# Patient Record
Sex: Male | Born: 1950
Health system: Southern US, Community
[De-identification: ages and names within clinical notes are randomized; demographics above are authoritative.]

## PROBLEM LIST (undated history)

## (undated) DIAGNOSIS — E785 Hyperlipidemia, unspecified: Secondary | ICD-10-CM

## (undated) DIAGNOSIS — M1990 Unspecified osteoarthritis, unspecified site: Secondary | ICD-10-CM

## (undated) DIAGNOSIS — E119 Type 2 diabetes mellitus without complications: Secondary | ICD-10-CM

## (undated) DIAGNOSIS — I10 Essential (primary) hypertension: Secondary | ICD-10-CM

## (undated) DIAGNOSIS — Z91030 Bee allergy status: Secondary | ICD-10-CM

## (undated) DIAGNOSIS — I6529 Occlusion and stenosis of unspecified carotid artery: Secondary | ICD-10-CM

## (undated) DIAGNOSIS — I771 Stricture of artery: Secondary | ICD-10-CM

## (undated) DIAGNOSIS — G4733 Obstructive sleep apnea (adult) (pediatric): Secondary | ICD-10-CM

## (undated) DIAGNOSIS — I2510 Atherosclerotic heart disease of native coronary artery without angina pectoris: Secondary | ICD-10-CM

## (undated) DIAGNOSIS — E079 Disorder of thyroid, unspecified: Secondary | ICD-10-CM

## (undated) DIAGNOSIS — Z8739 Personal history of other diseases of the musculoskeletal system and connective tissue: Secondary | ICD-10-CM

## (undated) DIAGNOSIS — E039 Hypothyroidism, unspecified: Secondary | ICD-10-CM

## (undated) DIAGNOSIS — I4819 Other persistent atrial fibrillation: Secondary | ICD-10-CM

## (undated) DIAGNOSIS — Z86010 Personal history of colonic polyps: Secondary | ICD-10-CM

## (undated) DIAGNOSIS — K269 Duodenal ulcer, unspecified as acute or chronic, without hemorrhage or perforation: Secondary | ICD-10-CM

## (undated) DIAGNOSIS — E559 Vitamin D deficiency, unspecified: Secondary | ICD-10-CM

## (undated) HISTORY — DX: Obstructive sleep apnea (adult) (pediatric): G47.33

## (undated) HISTORY — DX: Stricture of artery: I77.1

## (undated) HISTORY — DX: Essential (primary) hypertension: I10

## (undated) HISTORY — DX: Hyperlipidemia, unspecified: E78.5

## (undated) HISTORY — DX: Other persistent atrial fibrillation: I48.19

## (undated) HISTORY — DX: Occlusion and stenosis of unspecified carotid artery: I65.29

## (undated) HISTORY — PX: CORONARY ANGIOPLASTY: SHX604

## (undated) HISTORY — PX: UVULECTOMY: SHX2631

## (undated) HISTORY — DX: Vitamin D deficiency, unspecified: E55.9

## (undated) HISTORY — DX: Disorder of thyroid, unspecified: E07.9

## (undated) HISTORY — DX: Unspecified osteoarthritis, unspecified site: M19.90

## (undated) HISTORY — DX: Personal history of colonic polyps: Z86.010

## (undated) HISTORY — DX: Duodenal ulcer, unspecified as acute or chronic, without hemorrhage or perforation: K26.9

## (undated) HISTORY — DX: Atherosclerotic heart disease of native coronary artery without angina pectoris: I25.10

---

## 1999-03-03 ENCOUNTER — Encounter: Payer: Self-pay | Admitting: Internal Medicine

## 1999-03-03 ENCOUNTER — Inpatient Hospital Stay (HOSPITAL_COMMUNITY): Admission: EM | Admit: 1999-03-03 | Discharge: 1999-03-05 | Payer: Self-pay | Admitting: Emergency Medicine

## 1999-04-22 ENCOUNTER — Ambulatory Visit (HOSPITAL_COMMUNITY): Admission: RE | Admit: 1999-04-22 | Discharge: 1999-04-22 | Payer: Self-pay | Admitting: *Deleted

## 1999-04-23 ENCOUNTER — Encounter: Payer: Self-pay | Admitting: *Deleted

## 1999-05-26 HISTORY — PX: TOTAL HIP ARTHROPLASTY: SHX124

## 1999-10-03 ENCOUNTER — Encounter: Payer: Self-pay | Admitting: Orthopedic Surgery

## 1999-10-09 ENCOUNTER — Encounter: Payer: Self-pay | Admitting: Orthopedic Surgery

## 1999-10-09 ENCOUNTER — Inpatient Hospital Stay (HOSPITAL_COMMUNITY): Admission: RE | Admit: 1999-10-09 | Discharge: 1999-10-14 | Payer: Self-pay | Admitting: Orthopedic Surgery

## 1999-11-14 ENCOUNTER — Encounter: Payer: Self-pay | Admitting: Orthopedic Surgery

## 1999-11-14 ENCOUNTER — Inpatient Hospital Stay (HOSPITAL_COMMUNITY): Admission: RE | Admit: 1999-11-14 | Discharge: 1999-11-20 | Payer: Self-pay | Admitting: Orthopedic Surgery

## 1999-11-17 ENCOUNTER — Encounter: Payer: Self-pay | Admitting: Orthopedic Surgery

## 2007-05-27 LAB — HM COLONOSCOPY

## 2008-03-26 ENCOUNTER — Ambulatory Visit (HOSPITAL_COMMUNITY): Admission: RE | Admit: 2008-03-26 | Discharge: 2008-03-26 | Payer: Self-pay | Admitting: Internal Medicine

## 2008-03-27 ENCOUNTER — Ambulatory Visit: Payer: Self-pay | Admitting: Internal Medicine

## 2008-04-10 ENCOUNTER — Ambulatory Visit: Payer: Self-pay | Admitting: Internal Medicine

## 2008-04-10 ENCOUNTER — Encounter: Payer: Self-pay | Admitting: Internal Medicine

## 2008-04-10 DIAGNOSIS — Z860101 Personal history of adenomatous and serrated colon polyps: Secondary | ICD-10-CM | POA: Insufficient documentation

## 2008-04-10 DIAGNOSIS — Z86010 Personal history of colonic polyps: Secondary | ICD-10-CM

## 2008-04-10 HISTORY — DX: Personal history of adenomatous and serrated colon polyps: Z86.0101

## 2008-04-10 HISTORY — DX: Personal history of colonic polyps: Z86.010

## 2008-04-15 ENCOUNTER — Encounter: Payer: Self-pay | Admitting: Internal Medicine

## 2010-10-10 NOTE — Op Note (Signed)
. [facility]  Patient:    Christopher Wade, Christopher Wade                        MRN: [id number] Proc. Date: 10/09/99 Adm. Date:  [id number] Attending:  Colbert Ewing Dictator:   Loreta Ave, M.D.                           Operative Report  PREOPERATIVE DIAGNOSIS:  End-stage degenerative joint disease, left hip.  POSTOPERATIVE DIAGNOSIS:  End-stage degenerative joint disease, left hip.  PROCEDURE:  Left total hip replacement utilizing Osteonics prosthesis.  Press fit 58 mm acetabular component, screw fixation x 2, and 10 degree 28 mm crossfire polyethylene insert.  Femoral replacement with a secure fit plus size #10 with a 14 mm distal stem, 127 degree neck angle, and -3 head.  All non-cemented.  Appropriate soft tissue release and periarticular spur excision.  SURGEON:  Dr. Mckinley Jewel.  ASSISTANT:  Oris Drone. Petrarca, P.A.-C.  ANESTHESIA:  General.  ESTIMATED BLOOD LOSS:  300 cc.  SPECIMENS:  Excised bone and soft tissue.  BLOOD GIVEN:  None.  CULTURES:  None.  COMPLICATIONS:  None.  DRAINS:  None.  DRESSING:  Soft compressive with abduction pillow.  DESCRIPTION OF PROCEDURE:  The patient was brought to the operating room and placed on the operating room table in supine position.  After adequate anesthesia had been obtained, turned to the lateral position, left side up with appropriate padding and support.  Prepped and draped in the usual sterile fashion.  Incision laterally up the shaft of the femur, curving posterior superior from the trochanter.  Skin and subcutaneous tissues divided. Hemostasis obtained with electrocautery.  Iliotibial band exposed and incised up.  ______ retractor put in place.  Thickened bursa resected.  External rotators and capsule taken down off the back of the intertrochanteric groove of the femur.  Tagged with #1 Ethibond.  Hip exposed and dislocated posteriorly.  Extensive spurring periarticularly around  the acetabulum debrided back to the normal acetabular rim.  Femoral head removed one fingerbreadth above the lesser trochanter in line with the definitive component with an oscillating saw.  Grade four changes on both sides of the joint.  Thickened soft tissue and debris were removed throughout.  Acetabulum exposed with appropriate retractors.  Sequential reaming up to bleeding bone throughout at 58 mm.  Acetabular component hammered in place, placed in 45 degrees of abduction, and 20 of anteversion.  Excellent capturing and fixation.  Augmented with two screw fixation.  A 20 mm screw above and a 16 mm posterior superior.  A 10 degree insert was placed with the 10 degree overhand placed posterosuperior.  Retractors removed.  Femur exposed.  Sequential reaming proximally and distally with a hand held power reamers up to a #10 component.  Fourteen mm distal reaming.  Somewhat of a mismatch proximal and distal anatomy, but the size 10 component gave me the best fit proximally and distally.  A trial reduction with the 127 degree 35 mm neck and the -3 head. This gave me good restoration of anatomy, restoration of overall length of the extremity, and excellent stability of flexion/extension.  The hip was re-dislocated.  The trial removed.  Definitive component hammered down into place with excellent seating, capturing, and fitting, restoring normal femoral anteversion.  A -3 head attached.  Hip reduced.  Tested for stability in all planes which was excellent.  I had done an appropriate soft tissue release around the hip which was very contracted in order to ensure reasonable motion and completion.  Wound irrigated.  External rotators and capsule repaired through drill holes of the back of the intertrochanteric groove, and overtied over bone.  ______  retractor removed.  The iliotibial band closed with #1 Vicryl.  Skin and subcutaneous tissue closed with Vicryl and staples.  The margins of the  wound were injected with Marcaine.  Sterile compressive dressing applied.  Returned to supine position.  Abduction pillow placed. Anesthesia reversed.  Brought to recovery room.  Tolerated surgery with no complications. DD:  10/09/99 TD:  10/14/99 Job: [id number]

## 2010-10-10 NOTE — Discharge Summary (Signed)
[place]. [facility]  Patient:    Christopher Wade, Christopher Wade                        MRN: [id number] Adm. Date:  [id number] Disc. Date: [id number] Attending:  Colbert Ewing Dictator:   Oris Drone. Petrarca, P.A.-C.                           Discharge Summary  ADMISSION DIAGNOSIS:  Advanced degenerative joint disease of the right hip.  DISCHARGE DIAGNOSIS:  Advanced degenerative joint disease of the right hip.  PROCEDURE:  Right total hip replacement.  HISTORY OF PRESENT ILLNESS:  This patient is a 60 year old, white, single male with a longstanding history of right DJD of the hip.  He has undergone a left total hip replacement in May of 2001 with good excellent results.  Now he is having constant pain in his right hip and groin area.  He has requested surgical intervention at this time.  HOSPITAL COURSE:  This 60 year old white male was admitted on November 14, 1999. After appropriate laboratory studies were obtained, he was taken to the operating room that day where he underwent a total hip replacement on the right.  He tolerated the procedure well.  He was placed preoperatively and postoperatively on Ancef prophylaxis.  He was also begun on heparin and Coumadin for antithrombotic prophylaxis.  PT and OT consults were ordered.  A social service consult was also ordered.  PT for ambulation, touchdown weightbearing on the right.  PCA pump for pain control.  He had difficulty with his abduction pillow, which caused pain postoperatively and he was then just given a regular pillow between the legs.  This improved his pain.  He was given Percocet also with the PCA pump.  He had some difficulty controlling his pain.  Once we were able to get him controlled orally, we discontinued his PCA.  The remainder of his hospital course was uneventful, except for elevation in his temperature of November 17, 1999.  He also did have some hypokalemia.  Cultures revealed a urinary tract  infection and this was treated with ciprofloxacin.  A Doppler of the right lower extremity was also ordered, which was negative.  DISPOSITION:  The remainder of his hospital course was uneventful and he was discharged on November 20, 1999.  FOLLOW-UP:  To return back to the office in 7-10 days for follow-up.  LABORATORY DATA:  Doppler studies showed no evidence of DVTs, superficial thrombosis, or Bakers cyst bilaterally.  A chest x-ray on November 17, 1999, revealed no acute disease.  Pelvic on November 14, 1999, showed status post right total hip replacement with good position and alignment.  Laboratory studies on November 14, 1999, revealed hemoglobin 12.2, hematocrit 36.9%, white count 7800, and platelets 308,000.  Discharge hemoglobin 9.1, hematocrit 26.4%, white count 11,900, and platelets 387,000. Blood chemistries on November 14, 1999, revealed a sodium of 140, potassium 3.9, chloride 108, CO2 28, glucose 113, BUN 18, creatinine 0.8, calcium 9.3, total protein 7.3, albumin 3.6, AST 19, ALT 22, ALP 108, and total bilirubin 0.8. On November 18, 1999, the sodium was 1374, potassium 3.3, chloride 102, CO2 27, glucose 107, BUN 12, creatinine 0.6, calcium 8.2, total protein 6.2, albumin 2.2, AST 29, ALT 32, ALP 126, and total bilirubin 0.6.  A repeat potassium on November 19, 1999, was 3.6.  The urinalysis preoperatively showed rare epithelials, 0-5 whites, 0-5 reds,  rare bacteria, and calcium oxalate crystals.  On November 17, 1999, it showed moderate leukocyte esterase with 30 protein, white cells 11, no red cells, and many bacteria.  Blood cultures showed no growth after five days x 2.  The urine culture revealed Pseudomonas aeruginosa, which was sensitive to everything, but ceftriaxone and Septra.  DISCHARGE MEDICATIONS: 1. Given a prescription for Percocet one to two q.4h. p.r.n. pain. 2. Coumadin 5 mg one and a half tablets alternating with two tablets. 3. Iron sulfate 325 mg one daily. 4. Cipro 500 mg one  every 12 hours.  ACTIVITY:  Ambulate with his walker with touchdown weightbearing only.  SPECIAL INSTRUCTIONS:  Watch for signs of infection, increasing temperature, or change in drainage from the wound.  FOLLOW-UP:  He will follow back up with [place] in 7-10 days for staple removal.  CONDITION ON DISCHARGE:  Improved. DD:  12/21/99 TD:  12/23/99 Job: [id number]

## 2010-10-10 NOTE — Op Note (Signed)
. [facility]  Patient:    Christopher Wade, Christopher Wade                        MRN: [id number] Proc. Date: 11/14/99 Adm. Date:  [id number] Attending:  Colbert Ewing                           Operative Report  PREOPERATIVE DIAGNOSIS:  End-stage degenerative joint disease right hip with marked arthrofibrosis.  POSTOPERATIVE DIAGNOSIS:  End-stage degenerative joint disease right hip with marked arthrofibrosis.  OPERATIVE PROCEDURE:  Right total hip replacement utilizing Osteonics prosthesis.  Press-fit 60 mm acetabular component, screw fixation x 2 and 10 degree polyethylene insert.  Press-fit, secure-fit plus femoral component size #10 with a 14 mm distal tip, 127 degree neck and a zero head.  SURGEON:  Loreta Ave, M.D.  ASSISTANT:  Arlys John D. Petrarca, P.A.-C.  ANESTHESIA:  General.  BLOOD LOSS:  500 cc.  SPECIMEN:  Excised bone and soft tissue.  CULTURES:  None.  COMPLICATIONS:  None.  DRESSING:  Soft compressive with abduction pillow.  PROCEDURE:  Patient brought to the operating room, placed on the operating table in supine position.  Appropriate padding and support.  Prepped and draped in usual sterile fashion.  Incision along the lateral cortex of femur extending posterior-superior.  Skin and subcutaneous tissue divided. Iliotibial band exposed, incised and Charnley retractor put in place. Hemostasis obtained with electrocautery.  External rotator, capsule and the superior margin of the gluteus attachment to the femur taken down, tagged with #1 Ethibond.  The hip exposed.  Marked periarticular spurring, all removed. The hip dislocated posteriorly.  Grade 4 changes throughout.  Femoral head removed one fingerbreadth above the lesser trochanter in line with a definitive component.  Retractors put in place.  Acetabulum exposed. Extensive periarticular spurring, loose fragments, abundant labrum all removed.  A sufficient release of the  capsule to allow full exposure and adequate motion postoperatively.  Sequential reaming up to 60 mm appropriately medial and inferior.  The definitive acetabular component hammered in place, placed at 45 degrees of abduction, 20 of anteversion.  Further fixation with 20 mm screw above and a 16 mm screw posterosuperior.  A 10 degree polyethylene insert was placed with the overhang placed posterosuperior.  Retractors removed.  Wound irrigated.  Femur exposed.  Sequential reaming with the hand-held and power reamers.  Kept good contact in fitting proximal and distal.  Sized for a #10 component, which was hammered in place and seated well and restoring normal femoral anteversion.  With the zero head attached, the hip was reduced.  Excellent alignment and restoration of length and stability in flexion and extension.  Stable at 90 of flexion, 20 of adduction and 60 of internal rotation.  Trial head was removed.  Definitive head seated on the component and then the hip re-reduced.  Again, assessing and obtaining good stability in flexion and extension.  Wound irrigated.  External rotators and capsule #1 Ethibond suture was then passed through drill holes at the back of the intertrochanteric groove.  Then tied over bone.  Wound irrigated.  The iliotibial band closed with #1 Vicryl, skin and subcutaneous tissue with Vicryl and staples.  Margins of the wound injected with Marcaine.  Sterile compressive dressing applied, returned to the supine position, abduction pillow placed.  Anesthesia reversed, brought to recovery room, tolerated surgery well, no complications. DD:  11/14/99 TD:  11/17/99 Job: [id number]

## 2010-10-10 NOTE — Discharge Summary (Signed)
[place]. [facility]  Patient:    Christopher Wade, Christopher Wade                        MRN: [id number] Adm. Date:  [id number] Disc. Date: [id number] Attending:  Colbert Ewing Dictator:   Oris Drone. Petrarca, P.A.-C.                           Discharge Summary  ADMISSION DIAGNOSIS:  Advanced degenerative joint disease of the left hip.  DISCHARGE DIAGNOSIS:  Advanced degenerative joint disease of the left hip.  PROCEDURE:  Left total hip replacement.  HISTORY:  This patient is a 60 year old white male with longstanding DJD of both hips, left greater than right.  He has suffered with pain over the past two years, most recently has a marked limp and constant pain.  He is now indicated for left total hip replacement.  HOSPITAL COURSE:  A 60 year old white male admitted Oct 09, 1999, after appropriate laboratory studies were obtained and 1 g of Ancef IV on call to the operating room, was taken to the operating room where he underwent a left total hip replacement.  The patient tolerated the procedure well.  He was placed on Coumadin and heparin prophylaxis postoperatively.  He continued with Ancef 1 g IV q.8h.  PT and OT consults were ordered.  Physical therapy touchdown weightbearing on the left.  PCA pump was used for pain control.  He was allowed out of bed to a chair the following day.  His Foley catheter was discontinued on postoperatively day #2.  He was weaned to oral pain medications on Oct 10, 1999.  The remainder of his hospital course was unremarkable.  He was discharged in improved condition on Oct 14, 1999.  He is to follow up with Ammie Dalton, M.D., one week postoperatively.  He is to follow up with Loreta Ave, M.D., on Oct 22, 1999.  Social service and case management had been consulted and appropriate equipment was ordered.  EKG showed normal sinus rhythm.  RADIOGRAPHIC STUDIES:  Oct 09, 1999, shows status post left total hip replacement as  described.  LABORATORY STUDIES:  Oct 03, 1999, showed a hemoglobin of 14.6, hematocrit 40.3%, white count 7400, platelets 275,000.  Oct 12, 1999, shows hemoglobin of 10.1, hematocrit 29.0%, white count 9200, platelets 229,000.  Chemistries preoperatively revealed sodium 141, potassium 3.8, chloride 105, CO2 30, glucose 102, BUN 15, creatinine 0.8, calcium 9.2, total protein 7.2, albumin 4.1, AST 26, ALT 33, ALP 97, total bilirubin 0.8.  Oct 12, 1999, shows sodium 140, potassium 3.5, chloride 104, CO2 30, glucose 110, BUN 12, creatinine 0.6, calcium 8.3.  Urinalysis was benign for ovoid urine.  He was blood type A-, antibody screen was negative.  DISCHARGE INSTRUCTIONS:  He was given a prescription for Percocet one to two tablets p.o. q.4h. p.r.n. pain, Colace 100 mg p.o. b.i.d., Coumadin as per [facility] pharmacy, Senokot one tablet one half hour before meals, Metamucil wafers daily.  He will perform touchdown weightbearing on the left only.  He will change his dressings daily.  He will call for temperature elevation greater than 101 or drainage from the hip wound.  He will follow up with Dr. Eulah Pont on Oct 22, 1999.  He will follow up with Dr. Theda Belfast the following week.  DISCHARGE CONDITION:  He was discharged in improved condition. DD:  12/21/99 TD:  12/23/99 Job: [id number]  [id number]

## 2010-10-10 NOTE — Op Note (Signed)
[place]. [facility]  Patient:    Christopher Wade, Christopher Wade                        MRN: [id number] Proc. Date: 10/09/99 Adm. Date:  [id number] Attending:  Colbert Ewing                           Operative Report  PREOPERATIVE DIAGNOSIS:  End-stage degenerative joint disease, left hip.  POSTOPERATIVE DIAGNOSIS:  End-stage degenerative joint disease, left hip.  OPERATIVE PROCEDURE:  Left total hip replacement utilizing Osteonics prosthesis, Press-Fit, 58 mm acetabular component, screw fixation x 2, and 10 degree, 28 mm Cross-Fire polyethylene insert.  Femoral replacement with a Secur-Fit plus size #10 with a 14 mm distal stem, 127 degree neck angle, and minus 3 head.  All noncemented.  Appropriate soft tissue release and periarticular spur excision.  SURGEON:  Loreta Ave, M.D.  ASSISTANT:  Arlys John D. Petrarca, P.A.-C.  ANESTHESIA:  General.  BLOOD LOSS:  300 cc.  SPECIMENS:  Excised bone and soft tissue.  BLOOD GIVEN:  None.  CULTURES:  None.  COMPLICATIONS:  None.  DRAINS:  None.  DRESSING:  Self-compressive with abduction pillow.  DESCRIPTION OF PROCEDURE:  The patient was brought to the operating room and placed on the operating table in the supine position.  After adequate anesthesia had been obtained, turned to a lateral position with the left side up with appropriate padding and support.  Prepped and draped in the usual sterile fashion.  Incision laterally at the shaft at the femur, curving posterior-superior from the trochanter.  Skin and subcutaneous tissue divided. Hemostasis obtained with electrocautery.  The iliotibial band exposed and incised.  Charnley retractor put in place.  Thickened bursa resected. External rotators and capsule taken down off the back of the intertrochanteric groove on the femur.  Tagged with #1 Ethibond.  Hip exposed and dislocated posteriorly.  Extensive spurring periarticularly around the  acetabulum. Debrided back to the normal acetabular rim.  Femoral head removed one fingerbreadth above the lesser trochanter in line with the definitive component with an oscillating saw.  Grade 4 changes on both sides of the joint.  Thickened soft tissue and debris removed throughout.  Acetabulum exposed with appropriate retractors.  Sequential reaming up to bleeding bone throughout at 58 mm.  Acetabular component hammered in place.  Placed at 45 degrees of abduction and 20 of anteversion.  Excellent capturing and fixation.  Augmented with two screw fixation.  A 20 mm screw above and a 16 mm screw posterior-superior.  A 10 degree insert was placed with a 10 degree overhang placed posterosuperior.  Retractors removed.  Femur exposed. Sequential reaming proximally and distally with a hand-held and power reamers, up to a #10 component.  14 mm distal reaming.  Somewhat of a mismatch of proximal and distal anatomy, but the size 10 component gave it the best fit proximally and distally.  Trial reduction with the 127 degree, 35 mm neck and the minus 3 head.  This gave me good restoration of anatomy, restoration of overall length of the extremity, and excellent stability and flexion and extension.  The hip was redislocated.  The trial removed.  Definitive component hammered down into place with excellent seating, capturing, and fitting, restoring normal femoral anteversion.  Minus 3 head attached.  Hip reduced.  Tested for stability in all planes which was excellent.  I had done an appropriate  soft tissue release around the hip which was very contracted in order to ensure reasonable motion at completion.  The wound irrigated.  External rotators and capsule repaired through drill holes at the back of the intertrochanteric groove, and overtied over bone. Charnley retractor removed.  The iliotibial band closed with #1 Vicryl, the skin and subcutaneous tissue with Vicryl and staples.  Margins of the  wound injected with Marcaine.  Sterile compressive dressing applied.  Returned to the supine position, abduction pillow in place.  Anesthesia reversed and brought to the recovery room.  Tolerated surgery well with no complications. DD:  10/09/99 TD:  10/14/99 Job: [id number]

## 2011-06-05 ENCOUNTER — Encounter: Payer: Self-pay | Admitting: Internal Medicine

## 2012-02-17 ENCOUNTER — Encounter: Payer: Self-pay | Admitting: Internal Medicine

## 2013-04-03 ENCOUNTER — Other Ambulatory Visit: Payer: Self-pay | Admitting: Internal Medicine

## 2013-04-03 MED ORDER — METFORMIN HCL 500 MG PO TABS: 500.0000 mg | ORAL_TABLET | Freq: Two times a day (BID) | ORAL | Status: DC

## 2013-04-03 MED ORDER — BISOPROLOL-HYDROCHLOROTHIAZIDE 10-6.25 MG PO TABS: 1.0000 | ORAL_TABLET | Freq: Every day | ORAL | Status: DC

## 2013-04-25 ENCOUNTER — Encounter: Payer: Self-pay | Admitting: Physician Assistant

## 2013-04-25 DIAGNOSIS — E1169 Type 2 diabetes mellitus with other specified complication: Secondary | ICD-10-CM | POA: Insufficient documentation

## 2013-04-25 DIAGNOSIS — E1122 Type 2 diabetes mellitus with diabetic chronic kidney disease: Secondary | ICD-10-CM | POA: Insufficient documentation

## 2013-04-25 DIAGNOSIS — E079 Disorder of thyroid, unspecified: Secondary | ICD-10-CM

## 2013-04-25 DIAGNOSIS — I10 Essential (primary) hypertension: Secondary | ICD-10-CM

## 2013-04-25 DIAGNOSIS — E559 Vitamin D deficiency, unspecified: Secondary | ICD-10-CM | POA: Insufficient documentation

## 2013-04-25 DIAGNOSIS — E785 Hyperlipidemia, unspecified: Secondary | ICD-10-CM

## 2013-04-25 DIAGNOSIS — E119 Type 2 diabetes mellitus without complications: Secondary | ICD-10-CM

## 2013-04-27 ENCOUNTER — Ambulatory Visit (INDEPENDENT_AMBULATORY_CARE_PROVIDER_SITE_OTHER): Payer: BC Managed Care – PPO | Admitting: Physician Assistant

## 2013-04-27 ENCOUNTER — Encounter: Payer: Self-pay | Admitting: Physician Assistant

## 2013-04-27 VITALS — BP 160/82 | HR 64 | Temp 98.0°F | Resp 16 | Ht 70.0 in | Wt 263.0 lb

## 2013-04-27 DIAGNOSIS — Z79899 Other long term (current) drug therapy: Secondary | ICD-10-CM

## 2013-04-27 DIAGNOSIS — Z125 Encounter for screening for malignant neoplasm of prostate: Secondary | ICD-10-CM

## 2013-04-27 DIAGNOSIS — E559 Vitamin D deficiency, unspecified: Secondary | ICD-10-CM

## 2013-04-27 DIAGNOSIS — Z0000 Encounter for general adult medical examination without abnormal findings: Secondary | ICD-10-CM

## 2013-04-27 DIAGNOSIS — I10 Essential (primary) hypertension: Secondary | ICD-10-CM

## 2013-04-27 LAB — CBC WITH DIFFERENTIAL/PLATELET
Basophils Absolute: 0 10*3/uL (ref 0.0–0.1)
Eosinophils Absolute: 0.1 10*3/uL (ref 0.0–0.7)
HCT: 44.9 % (ref 39.0–52.0)
Hemoglobin: 16 g/dL (ref 13.0–17.0)
Lymphocytes Relative: 27 % (ref 12–46)
Monocytes Absolute: 0.5 10*3/uL (ref 0.1–1.0)
Monocytes Relative: 7 % (ref 3–12)
Neutro Abs: 5.1 10*3/uL (ref 1.7–7.7)
Neutrophils Relative %: 65 % (ref 43–77)
Platelets: 287 10*3/uL (ref 150–400)
RDW: 13.9 % (ref 11.5–15.5)
WBC: 7.8 10*3/uL (ref 4.0–10.5)

## 2013-04-27 LAB — HEPATIC FUNCTION PANEL
Alkaline Phosphatase: 71 U/L (ref 39–117)
Bilirubin, Direct: 0.1 mg/dL (ref 0.0–0.3)
Indirect Bilirubin: 0.4 mg/dL (ref 0.0–0.9)
Total Bilirubin: 0.5 mg/dL (ref 0.3–1.2)
Total Protein: 7.2 g/dL (ref 6.0–8.3)

## 2013-04-27 LAB — LIPID PANEL
HDL: 46 mg/dL (ref >39)
LDL Cholesterol: 95 mg/dL (ref 0–99)
Total CHOL/HDL Ratio: 4.1 Ratio
VLDL: 46 mg/dL — ABNORMAL HIGH (ref 0–40)

## 2013-04-27 LAB — BASIC METABOLIC PANEL WITH GFR
BUN: 23 mg/dL (ref 6–23)
Chloride: 99 mEq/L (ref 96–112)
GFR, Est Non African American: 71 mL/min
Potassium: 4.3 mEq/L (ref 3.5–5.3)
Sodium: 140 mEq/L (ref 135–145)

## 2013-04-27 LAB — MAGNESIUM: Magnesium: 2.2 mg/dL (ref 1.5–2.5)

## 2013-04-27 LAB — PSA: PSA: 0.36 ng/mL (ref <=4.00)

## 2013-04-27 LAB — TSH: TSH: 2.826 u[IU]/mL (ref 0.350–4.500)

## 2013-04-27 LAB — HEMOGLOBIN A1C: Hgb A1c MFr Bld: 6.6 % — ABNORMAL HIGH (ref <5.7)

## 2013-04-27 MED ORDER — LOSARTAN POTASSIUM 100 MG PO TABS: 100.0000 mg | ORAL_TABLET | Freq: Every day | ORAL | Status: DC

## 2013-04-27 NOTE — Progress Notes (Signed)
Complete Physical HPI Patient presents for complete physical.   Patient's blood pressure has been controlled at home, highest he has seen his BP was 172 before meds, but sometimes it is 130-140. Patient is suppose to be on Cozaar for kidney and heart protection with his DM but states he has not been taking it and does not know how it got off his list.  Patient walks and climbs for his job. Patient denies chest pain, shortness of breath, dizziness. Patient's cholesterol is diet controlled. They are on Fenofibrate and denies myalgias. The patient's cholesterol last visit was LDL 105.  The patient has been working on diet and exercise for prediabetes, denies changes in vision, polys, and paresthesias. Last A1C in office was 7.4 so patient was started on metformin and denies any side effects.  Vitamin D was 55 last visit.  Current Medications:  Current Outpatient Prescriptions on File Prior to Visit  Medication Sig Dispense Refill  . amLODipine (NORVASC) 5 MG tablet Take 5 mg by mouth daily.      [name] aspirin 81 MG tablet Take 81 mg by mouth daily.      . bisoprolol-hydrochlorothiazide (ZIAC) 10-6.25 MG per tablet Take 1 tablet by mouth daily.  90 tablet  3  . cholecalciferol (VITAMIN D) 1000 UNITS tablet Take 1,000 Units by mouth daily.      . fenofibrate micronized (LOFIBRA) 134 MG capsule Take 134 mg by mouth daily before breakfast.      . hydrochlorothiazide (MICROZIDE) 12.5 MG capsule Take 12.5 mg by mouth daily.      [name] HYDROcodone-acetaminophen (NORCO/VICODIN) 5-325 MG per tablet Take 1 tablet by mouth every 6 (six) hours as needed for moderate pain.      . Magnesium 250 MG TABS Take 500 mg by mouth.       . metFORMIN (GLUCOPHAGE) 500 MG tablet Take 1 tablet (500 mg total) by mouth 2 (two) times daily with a meal.  60 tablet  3   No current facility-administered medications on file prior to visit.   Health Maintenance:  Tetanus: 2011 Pneumovax: N/A Flu vaccine: 2014 Zostavax:  N/A Colonoscopy: 2009  EGD:N/A  Allergies: No Known Allergies  Medical History:  Past Medical History  Diagnosis Date  . Hypertension   . Hyperlipidemia   . Diabetes mellitus without complication   . Thyroid disease   . Vitamin D deficiency   . Allergy   . Arthritis     lumbar   Surgical History:  Past Surgical History  Procedure Laterality Date  . Joint replacement Bilateral 2001    Hips   Family History:  Family History  Problem Relation Age of Onset  . Cancer Mother 44    Lung  . Cancer Father 70    lung  . Cancer Sister 39    colon   Social History:  History   Social History  . Marital Status: Single    Spouse Name: N/A    Number of Children: N/A  . Years of Education: N/A   Occupational History  . Not on file.   Social History Main Topics  . Smoking status: Never Smoker   . Smokeless tobacco: Never Used  . Alcohol Use: No  . Drug Use: No  . Sexual Activity: Not Currently   Other Topics Concern  . Not on file   Social History Narrative  . No narrative on file   ROS Constitutional: Denies weight loss/gain, headaches, insomnia, fatigue, night sweats, and change in appetite. Eyes: ([facility]) DEE 04/28/13 normal  Denies redness, blurred vision, diplopia, discharge, itchy, watery eyes.  ENT: Denies discharge, congestion, post nasal drip, sore throat, earache, hearing loss, dental pain, Tinnitus, Vertigo, Sinus pain, snoring.  Cardio: Denies chest pain, palpitations, irregular heartbeat, dyspnea, diaphoresis, orthopnea, PND, claudication, edema Respiratory: denies cough, dyspnea, pleurisy, hoarseness, wheezing.  Gastrointestinal: (Dr. Leone Payor) Denies dysphagia, heartburn, pain, cramps, nausea, vomiting, bloating, diarrhea, constipation, hematemesis, melena, hematochezia, hemorrhoids Genitourinary:1-2 nocturia Denies dysuria, frequency, urgency, hesitancy, discharge, hematuria, flank pain Breast:Denies Breast lumps, nipple discharge, bleeding.   Musculoskeletal: Denies arthralgia, myalgia, stiffness, Jt. Swelling, pain, Skin: Denies pruritis, rash, hives,  acne, eczema, changing in skin lesion Neuro: Denies Weakness, tremor, incoordination, spasms, paresthesia, pain Psychiatric: Denies confusion, memory loss, sensory loss Endocrine: Denies change in weight, skin, hair change, nocturia, and paresthesia, Diabetic Denies Polys, visual blurring, hyper /hypo glycemic episodes.  Heme/Lymph: Denies Excessive bleeding, bruising, enlarged lymph nodes  Physical Exam: Estimated body mass index is 37.74 kg/(m^2) as calculated from the following:   Height as of this encounter: 5\' 10"  (1.778 m).   Weight as of this encounter: 263 lb (119.296 kg). Filed Vitals:   04/27/13 1415  BP: 160/82  Pulse: 64  Temp: 98 F (36.7 C)  Resp: 16   General Appearance: Well nourished, in no apparent distress. Eyes: Wears glasses PERRLA, EOMs, conjunctiva no swelling or erythema, normal fundi and vessels. Sinuses: No Frontal/maxillary tenderness ENT/Mouth: Crowded mouth structures. Ext aud canals clear, normal light reflex with TMs without erythema, bulging.  Good dentition. No erythema, swelling, or exudate on post pharynx. Tonsils not swollen or erythematous. Hearing normal.  Neck: Supple, thyroid normal. No bruits Respiratory: Respiratory effort normal, BS equal bilaterally without rales, rhonchi, wheezing or stridor. Cardio: Heart sounds normal, regular rate and rhythm without murmurs, rubs or gallops. Peripheral pulses brisk and equal bilaterally, without edema.  Chest: symmetric, with normal excursions and percussion. Abdomen: Obese, soft, with bowl sounds. + ventral hernia Non tender, no guarding, rebound,  masses, or organomegaly. .  Lymphatics: Non tender without lymphadenopathy.  Genitourinary: defer Musculoskeletal: Full ROM all peripheral extremities,5/5 strength, and normal gait. Skin: Warm, dry without rashes, lesions, ecchymosis.  Neuro:  Cranial nerves intact, reflexes equal bilaterally. Normal muscle tone, no cerebellar symptoms. Sensation intact.  Psych: Awake and oriented X 3, normal affect, Insight and Judgment appropriate.   EKG: Sinus brady, t wave inversion V1, no changes from previous EKGs.  Assessment and Plan: Hypertension- not at goal, restart cozaar 100mg  daily.   Hyperlipidemia- cont fenofibrate, with DM goal of LDL at 70, check lipid  Diabetes mellitus without complication- increase MF to 4 times a day and diet discussed. - check A1C  Thyroid disease- check TSH  Vitamin D deficiency- check level  Allergy- controlled  Arthritis- controlled  Crowded mouth- suggest considering sleep apea and study Call about colonoscopy date Call insurance about shingles vaccineHealth maintainence   Quentin Mulling 2:41 PM

## 2013-04-27 NOTE — Patient Instructions (Addendum)
Last colonoscopy was 2009 with Dr. Leone Payor please call him to see if it is 5 years or 10 years.  Please call your insurance company and see if they cover the shingles vaccine.  We are starting you on Metformin to prevent or treat diabetes. Metformin does not cause low blood sugars. In order to create energy your cells need insulin and sugar but sometime your cells do not accept the insulin and this can cause increased sugars and decreased energy. The Metformin helps your cells accept insulin and the sugar to give you more energy.   The two most common side effects are nausea and diarrhea, follow these rules to avoid it! You can take imodium per box instructions when starting metformin if needed.   Rules of metformin: 1) start out slow with only one pill daily. Our goal for you is 4 pills a day or 2000mg  total.  2) take with your largest meal. 3) Take with least amount of carbs.   Call if you have any problems.    Bad carbs also include fruit juice, alcohol, and sweet tea. These are empty calories that do not signal to your brain that you are full.   Please remember the good carbs are still carbs which convert into sugar. So please measure them out no more than 1/2-1 cup of rice, oatmeal, pasta, and beans.  Veggies are however free foods! Pile them on.   I like lean protein at every meal such as chicken, [place], pork chops, cottage cheese, etc. Just do not fry these meats and please center your meal around vegetable, the meats should be a side dish.   No all fruit is created equal. Please see the list below, the fruit at the bottom is higher in sugars than the fruit at the top

## 2013-04-28 LAB — URINALYSIS, ROUTINE W REFLEX MICROSCOPIC
Bilirubin Urine: NEGATIVE
Glucose, UA: NEGATIVE mg/dL
Hgb urine dipstick: NEGATIVE
Nitrite: NEGATIVE
Protein, ur: NEGATIVE mg/dL

## 2013-04-28 LAB — INSULIN, FASTING: Insulin fasting, serum: 26 u[IU]/mL (ref 3–28)

## 2013-04-28 LAB — MICROALBUMIN / CREATININE URINE RATIO
Microalb Creat Ratio: 9.2 mg/g (ref 0.0–30.0)
Microalb, Ur: 1.36 mg/dL (ref 0.00–1.89)

## 2013-07-30 ENCOUNTER — Other Ambulatory Visit: Payer: Self-pay | Admitting: Internal Medicine

## 2013-07-31 ENCOUNTER — Ambulatory Visit (INDEPENDENT_AMBULATORY_CARE_PROVIDER_SITE_OTHER): Payer: BC Managed Care – PPO | Admitting: Physician Assistant

## 2013-07-31 VITALS — BP 148/82 | HR 60 | Temp 98.4°F | Resp 16 | Ht 70.0 in | Wt 261.0 lb

## 2013-07-31 DIAGNOSIS — E079 Disorder of thyroid, unspecified: Secondary | ICD-10-CM

## 2013-07-31 DIAGNOSIS — I10 Essential (primary) hypertension: Secondary | ICD-10-CM

## 2013-07-31 DIAGNOSIS — E785 Hyperlipidemia, unspecified: Secondary | ICD-10-CM

## 2013-07-31 DIAGNOSIS — Z79899 Other long term (current) drug therapy: Secondary | ICD-10-CM

## 2013-07-31 DIAGNOSIS — E559 Vitamin D deficiency, unspecified: Secondary | ICD-10-CM

## 2013-07-31 DIAGNOSIS — E669 Obesity, unspecified: Secondary | ICD-10-CM | POA: Insufficient documentation

## 2013-07-31 DIAGNOSIS — E119 Type 2 diabetes mellitus without complications: Secondary | ICD-10-CM

## 2013-07-31 LAB — CBC WITH DIFFERENTIAL/PLATELET
Basophils Absolute: 0 10*3/uL (ref 0.0–0.1)
Basophils Relative: 0 % (ref 0–1)
EOS ABS: 0.1 10*3/uL (ref 0.0–0.7)
EOS PCT: 1 % (ref 0–5)
HEMATOCRIT: 45.5 % (ref 39.0–52.0)
Hemoglobin: 16 g/dL (ref 13.0–17.0)
LYMPHS ABS: 2.2 10*3/uL (ref 0.7–4.0)
LYMPHS PCT: 31 % (ref 12–46)
MCH: 33.3 pg (ref 26.0–34.0)
MCHC: 35.2 g/dL (ref 30.0–36.0)
MCV: 94.8 fL (ref 78.0–100.0)
Monocytes Absolute: 0.7 10*3/uL (ref 0.1–1.0)
Monocytes Relative: 10 % (ref 3–12)
Neutro Abs: 4.1 10*3/uL (ref 1.7–7.7)
Neutrophils Relative %: 58 % (ref 43–77)
Platelets: 274 10*3/uL (ref 150–400)
RBC: 4.8 MIL/uL (ref 4.22–5.81)
RDW: 14.3 % (ref 11.5–15.5)
WBC: 7 10*3/uL (ref 4.0–10.5)

## 2013-07-31 LAB — HEMOGLOBIN A1C
HEMOGLOBIN A1C: 6.8 % — AB (ref <5.7)
Mean Plasma Glucose: 148 mg/dL — ABNORMAL HIGH (ref <117)

## 2013-07-31 MED ORDER — HYDROCODONE-ACETAMINOPHEN 5-325 MG PO TABS: 1.0000 | ORAL_TABLET | Freq: Four times a day (QID) | ORAL | Status: DC | PRN

## 2013-07-31 MED ORDER — ATENOLOL 100 MG PO TABS: 100.0000 mg | ORAL_TABLET | Freq: Every day | ORAL | Status: DC

## 2013-07-31 NOTE — Patient Instructions (Signed)
Stop Ziac and start atenolol 100mg  can do one at night or 1/2 in AM and 1/2 in PM Call if BP over 130/80.  Hypertension As your heart beats, it forces blood through your arteries. This force is your blood pressure. If the pressure is too high, it is called hypertension (HTN) or high blood pressure. HTN is dangerous because you may have it and not know it. High blood pressure may mean that your heart has to work harder to pump blood. Your arteries may be narrow or stiff. The extra work puts you at risk for heart disease, stroke, and other problems.  Blood pressure consists of two numbers, a higher number over a lower, 110/72, for example. It is stated as "110 over 72." The ideal is below 120 for the top number (systolic) and under 80 for the bottom (diastolic). Write down your blood pressure today. You should pay close attention to your blood pressure if you have certain conditions such as:  Heart failure.  Prior heart attack.  Diabetes  Chronic kidney disease.  Prior stroke.  Multiple risk factors for heart disease. To see if you have HTN, your blood pressure should be measured while you are seated with your arm held at the level of the heart. It should be measured at least twice. A one-time elevated blood pressure reading (especially in the Emergency Department) does not mean that you need treatment. There may be conditions in which the blood pressure is different between your right and left arms. It is important to see your caregiver soon for a recheck. Most people have essential hypertension which means that there is not a specific cause. This type of high blood pressure may be lowered by changing lifestyle factors such as:  Stress.  Smoking.  Lack of exercise.  Excessive weight.  Drug/tobacco/alcohol use.  Eating less salt. Most people do not have symptoms from high blood pressure until it has caused damage to the body. Effective treatment can often prevent, delay or reduce that  damage. TREATMENT  When a cause has been identified, treatment for high blood pressure is directed at the cause. There are a large number of medications to treat HTN. These fall into several categories, and your caregiver will help you select the medicines that are best for you. Medications may have side effects. You should review side effects with your caregiver. If your blood pressure stays high after you have made lifestyle changes or started on medicines,   Your medication(s) may need to be changed.  Other problems may need to be addressed.  Be certain you understand your prescriptions, and know how and when to take your medicine.  Be sure to follow up with your caregiver within the time frame advised (usually within two weeks) to have your blood pressure rechecked and to review your medications.  If you are taking more than one medicine to lower your blood pressure, make sure you know how and at what times they should be taken. Taking two medicines at the same time can result in blood pressure that is too low. SEEK IMMEDIATE MEDICAL CARE IF:  You develop a severe headache, blurred or changing vision, or confusion.  You have unusual weakness or numbness, or a faint feeling.  You have severe chest or abdominal pain, vomiting, or breathing problems. MAKE SURE YOU:   Understand these instructions.  Will watch your condition.  Will get help right away if you are not doing well or get worse. Document Released: 05/11/2005 Document Revised: 08/03/2011  Document Reviewed: 12/30/2007 [facility] Patient Information 2014 [place]. DASH Diet The DASH diet stands for "Dietary Approaches to Stop Hypertension." It is a healthy eating plan that has been shown to reduce high blood pressure (hypertension) in as little as 14 days, while also possibly providing other significant health benefits. These other health benefits include reducing the risk of breast cancer after menopause and reducing the  risk of type 2 diabetes, heart disease, colon cancer, and stroke. Health benefits also include weight loss and slowing kidney failure in patients with chronic kidney disease.  DIET GUIDELINES  Limit salt (sodium). Your diet should contain less than 1500 mg of sodium daily.  Limit refined or processed carbohydrates. Your diet should include mostly whole grains. Desserts and added sugars should be used sparingly.  Include small amounts of heart-healthy fats. These types of fats include nuts, oils, and tub margarine. Limit saturated and trans fats. These fats have been shown to be harmful in the body. CHOOSING FOODS  The following food groups are based on a 2000 calorie diet. See your Registered Dietitian for individual calorie needs. Grains and Grain Products (6 to 8 servings daily)  Eat More Often: Whole-wheat bread, brown rice, whole-grain or wheat pasta, quinoa, popcorn without added fat or salt (air popped).  Eat Less Often: White bread, white pasta, white rice, cornbread. Vegetables (4 to 5 servings daily)  Eat More Often: Fresh, frozen, and canned vegetables. Vegetables may be raw, steamed, roasted, or grilled with a minimal amount of fat.  Eat Less Often/Avoid: Creamed or fried vegetables. Vegetables in a cheese sauce. Fruit (4 to 5 servings daily)  Eat More Often: All fresh, canned (in natural juice), or frozen fruits. Dried fruits without added sugar. One hundred percent fruit juice ( cup [237 mL] daily).  Eat Less Often: Dried fruits with added sugar. Canned fruit in light or heavy syrup. [employer]UM[employer]ands, Fish, and Poultry (2 servings or less daily. One serving is 3 to 4 oz [85-114 g]).  Eat More Often: Ninety percent or leaner ground beef, tenderloin, sirloin. Round cuts of beef, chicken breast, [place] breast. All fish. Grill, bake, or broil your meat. Nothing should be fried.  Eat Less Often/Avoid: Fatty cuts of meat, [place], or chicken leg, thigh, or wing. Fried cuts of meat or  fish. Dairy (2 to 3 servings)  Eat More Often: Low-fat or fat-free milk, low-fat plain or light yogurt, reduced-fat or part-skim cheese.  Eat Less Often/Avoid: Milk (whole, 2%).Whole milk yogurt. Full-fat cheeses. Nuts, Seeds, and Legumes (4 to 5 servings per week)  Eat More Often: All without added salt.  Eat Less Often/Avoid: Salted nuts and seeds, canned beans with added salt. Fats and Sweets (limited)  Eat More Often: Vegetable oils, tub margarines without trans fats, sugar-free gelatin. Mayonnaise and salad dressings.  Eat Less Often/Avoid: Coconut oils, palm oils, butter, stick margarine, cream, half and half, cookies, candy, pie. FOR MORE INFORMATION The Dash Diet Eating Plan: www.dashdiet.org Document Released: 04/30/2011 Document Revised: 08/03/2011 Document Reviewed: 04/30/2011 [facility] Patient Information 2014 [place], [place].

## 2013-07-31 NOTE — Progress Notes (Signed)
HPI 63 y.o. male  presents for 3 month follow up with hypertension, hyperlipidemia, diabetes and vitamin D. His blood pressure has not been controlled at home worse at night and running 160's, today their BP is BP: 148/82 mmHg He walking some with  workout. He denies chest pain, shortness of breath, dizziness.  He is on cholesterol medication and denies myalgias. His cholesterol is at goal. The cholesterol last visit was:   Lab Results  Component Value Date   CHOL 187 04/27/2013   HDL 46 04/27/2013   LDLCALC 95 04/27/2013   TRIG 232* 04/27/2013   CHOLHDL 4.1 04/27/2013   He has been working on diet and exercise for Diabetes, and denies foot ulcerations, hyperglycemia, hypoglycemia , nausea, paresthesia of the feet, polydipsia, polyuria and visual disturbances. A1C 90-120's. Last A1C in the office was:  Lab Results  Component Value Date   HGBA1C 6.6* 04/27/2013   Patient is on Vitamin D supplement.    Current Medications:  Current Outpatient Prescriptions on File Prior to Visit  Medication Sig Dispense Refill  . amLODipine (NORVASC) 5 MG tablet Take 5 mg by mouth daily.      [name] aspirin 81 MG tablet Take 81 mg by mouth daily.      . bisoprolol-hydrochlorothiazide (ZIAC) 10-6.25 MG per tablet Take 1 tablet by mouth daily.  90 tablet  3  . cholecalciferol (VITAMIN D) 1000 UNITS tablet Take 1,000 Units by mouth daily.      . fenofibrate micronized (LOFIBRA) 134 MG capsule Take 134 mg by mouth daily before breakfast.      . hydrochlorothiazide (MICROZIDE) 12.5 MG capsule take 1 capsule by mouth once daily  90 capsule  2  . HYDROcodone-acetaminophen (NORCO/VICODIN) 5-325 MG per tablet Take 1 tablet by mouth every 6 (six) hours as needed for moderate pain.      [name] losartan (COZAAR) 100 MG tablet Take 1 tablet (100 mg total) by mouth daily.  90 tablet  3  . Magnesium 250 MG TABS Take 500 mg by mouth.       . metFORMIN (GLUCOPHAGE) 500 MG tablet Take 1 tablet (500 mg total) by mouth 2 (two) times daily  with a meal.  60 tablet  3  . Omega-3 Fatty Acids (FISH OIL PO) Take 1,200 mg by mouth daily.       No current facility-administered medications on file prior to visit.   Medical History:  Past Medical History  Diagnosis Date  . Hypertension   . Hyperlipidemia   . Diabetes mellitus without complication   . Thyroid disease   . Vitamin D deficiency   . Allergy   . Arthritis     lumbar   Allergies: No Known Allergies   Review of Systems: [X]  = complains of  [ ]  = denies  General: Fatigue [ ]  Fever [ ]  Chills [ ]  Weakness [ ]   Insomnia [ ]  Eyes: Redness [ ]  Blurred vision [ ]  Diplopia [ ]   ENT: Congestion [ ]  Sinus Pain [ ]  Post Nasal Drip [ ]  Sore Throat [ ]  Earache [ ]   Cardiac: Chest pain/pressure [ ]  SOB [ ]  Orthopnea [ ]   Palpitations [ ]   Paroxysmal nocturnal dyspnea[ ]  Claudication [ ]  Edema [ ]   Pulmonary: Cough [ ]  Wheezing[ ]   SOB [ ]   Snoring [ ]   GI: Nausea [ ]  Vomiting[ ]  Dysphagia[ ]  Heartburn[ ]  Abdominal pain [ ]  Constipation [ ] ; Diarrhea [ ] ; BRBPR [ ]  Melena[ ]  GU: Hematuria[ ]   Dysuria [ ]  Nocturia[ ]  Urgency [ ]   Hesitancy [ ]  Discharge [ ]  Neuro: Headaches[ ]  Vertigo[ ]  Paresthesias[ ]  Spasm [ ]  Speech changes [ ]  Incoordination [ ]   Ortho: Arthritis [ ]  Joint pain [ ]  Muscle pain [ ]  Joint swelling [ ]  Back Pain [ ]  Skin:  Rash [ ]   Pruritis [ ]  Change in skin lesion [ ]   Psych: Depression[ ]  Anxiety[ ]  Confusion [ ]  Memory loss [ ]   Heme/Lypmh: Bleeding [ ]  Bruising [ ]  Enlarged lymph nodes [ ]   Endocrine: Visual blurring [ ]  Paresthesia [ ]  Polyuria [ ]  Polydypsea [ ]    Heat/cold intolerance [ ]  Hypoglycemia [ ]   Family history- Review and unchanged Social history- Review and unchanged Physical Exam: Filed Vitals:   07/31/13 1643  BP: 148/82  Pulse: 60  Temp: 98.4 F (36.9 C)  Resp: 16   Wt Readings from Last 3 Encounters:  07/31/13 261 lb (118.389 kg)  04/27/13 263 lb (119.296 kg)   General Appearance: Well nourished, in no apparent  distress. Eyes: PERRLA, EOMs, conjunctiva no swelling or erythema Sinuses: No Frontal/maxillary tenderness ENT/Mouth: Ext aud canals clear, TMs without erythema, bulging. No erythema, swelling, or exudate on post pharynx.  Tonsils not swollen or erythematous. Hearing normal.  Neck: Supple, thyroid normal.  Respiratory: Respiratory effort normal, BS equal bilaterally without rales, rhonchi, wheezing or stridor.  Cardio: RRR with no MRGs. Brisk peripheral pulses without edema.  Abdomen: Soft, + BS.  Non tender, no guarding, rebound, hernias, masses. Lymphatics: Non tender without lymphadenopathy.  Musculoskeletal: Full ROM, 5/5 strength, normal gait.  Skin: Warm, dry without rashes, lesions, ecchymosis.  Neuro: Cranial nerves intact. No cerebellar symptoms. Sensation intact.  Psych: Awake and oriented X 3, normal affect, Insight and Judgment appropriate.   Assessment and Plan:  Hypertension: Continue medication, monitor blood pressure at home. Continue DASH diet. Will stop Ziac and start him on Atenolol 100mg  QHS. Monitor BP and HR.  Cholesterol: Continue diet and exercise. Check cholesterol.  Diabetes-Continue diet and exercise. Check A1C Vitamin D Def- check level and continue medications.   Continue diet and meds as discussed. Further disposition pending results of labs. Discussed med's effects and SE's.    Vicie Mutters 4:54 PM

## 2013-08-01 ENCOUNTER — Other Ambulatory Visit: Payer: Self-pay

## 2013-08-01 LAB — HEPATIC FUNCTION PANEL
ALT: 26 U/L (ref 0–53)
AST: 17 U/L (ref 0–37)
Albumin: 4.4 g/dL (ref 3.5–5.2)
Alkaline Phosphatase: 55 U/L (ref 39–117)
BILIRUBIN DIRECT: 0.1 mg/dL (ref 0.0–0.3)
BILIRUBIN TOTAL: 0.5 mg/dL (ref 0.2–1.2)
Indirect Bilirubin: 0.4 mg/dL (ref 0.2–1.2)
Total Protein: 7.2 g/dL (ref 6.0–8.3)

## 2013-08-01 LAB — BASIC METABOLIC PANEL WITH GFR
BUN: 23 mg/dL (ref 6–23)
CHLORIDE: 104 meq/L (ref 96–112)
CO2: 28 meq/L (ref 19–32)
CREATININE: 1.09 mg/dL (ref 0.50–1.35)
Calcium: 9.3 mg/dL (ref 8.4–10.5)
GFR, Est African American: 83 mL/min
GFR, Est Non African American: 72 mL/min
Glucose, Bld: 97 mg/dL (ref 70–99)
Potassium: 4.1 mEq/L (ref 3.5–5.3)
Sodium: 144 mEq/L (ref 135–145)

## 2013-08-01 LAB — TSH: TSH: 4.636 u[IU]/mL — ABNORMAL HIGH (ref 0.350–4.500)

## 2013-08-01 LAB — LIPID PANEL
CHOL/HDL RATIO: 4.6 ratio
Cholesterol: 176 mg/dL (ref 0–200)
HDL: 38 mg/dL — ABNORMAL LOW (ref >39)
LDL Cholesterol: 99 mg/dL (ref 0–99)
Triglycerides: 195 mg/dL — ABNORMAL HIGH (ref <150)
VLDL: 39 mg/dL (ref 0–40)

## 2013-08-01 LAB — INSULIN, FASTING: Insulin fasting, serum: 13 u[IU]/mL (ref 3–28)

## 2013-08-01 LAB — VITAMIN D 25 HYDROXY (VIT D DEFICIENCY, FRACTURES): Vit D, 25-Hydroxy: 73 ng/mL (ref 30–89)

## 2013-08-01 LAB — MAGNESIUM: Magnesium: 1.8 mg/dL (ref 1.5–2.5)

## 2013-08-01 MED ORDER — LEVOTHYROXINE SODIUM 50 MCG PO TABS: 50.0000 ug | ORAL_TABLET | Freq: Every day | ORAL | Status: DC

## 2013-08-30 ENCOUNTER — Other Ambulatory Visit: Payer: Self-pay | Admitting: Internal Medicine

## 2013-08-31 ENCOUNTER — Ambulatory Visit (INDEPENDENT_AMBULATORY_CARE_PROVIDER_SITE_OTHER): Payer: BC Managed Care – PPO | Admitting: Physician Assistant

## 2013-08-31 VITALS — BP 160/88 | HR 56 | Temp 98.8°F | Resp 16 | Ht 70.0 in | Wt 259.0 lb

## 2013-08-31 DIAGNOSIS — E079 Disorder of thyroid, unspecified: Secondary | ICD-10-CM

## 2013-08-31 DIAGNOSIS — I10 Essential (primary) hypertension: Secondary | ICD-10-CM

## 2013-08-31 NOTE — Patient Instructions (Signed)
Continue to check blood pressure at home, if your BP is above 150/80 take another Norvasc (Amlodipine) 5mg . This medication can cause swelling in your legs, if this happens let us know.   DASH Diet The DASH diet stands for "Dietary Approaches to Stop Hypertension." It is a healthy eating plan that has been shown to reduce high blood pressure (hypertension) in as little as 14 days, while also possibly providing other significant health benefits. These other health benefits include reducing the risk of breast cancer after menopause and reducing the risk of type 2 diabetes, heart disease, colon cancer, and stroke. Health benefits also include weight loss and slowing kidney failure in patients with chronic kidney disease.  DIET GUIDELINES  Limit salt (sodium). Your diet should contain less than 1500 mg of sodium daily.  Limit refined or processed carbohydrates. Your diet should include mostly whole grains. Desserts and added sugars should be used sparingly.  Include small amounts of heart-healthy fats. These types of fats include nuts, oils, and tub margarine. Limit saturated and trans fats. These fats have been shown to be harmful in the body. CHOOSING FOODS  The following food groups are based on a 2000 calorie diet. See your Registered Dietitian for individual calorie needs. Grains and Grain Products (6 to 8 servings daily)  Eat More Often: Whole-wheat bread, brown rice, whole-grain or wheat pasta, quinoa, popcorn without added fat or salt (air popped).  Eat Less Often: White bread, white pasta, white rice, cornbread. Vegetables (4 to 5 servings daily)  Eat More Often: Fresh, frozen, and canned vegetables. Vegetables may be raw, steamed, roasted, or grilled with a minimal amount of fat.  Eat Less Often/Avoid: Creamed or fried vegetables. Vegetables in a cheese sauce. Fruit (4 to 5 servings daily)  Eat More Often: All fresh, canned (in natural juice), or frozen fruits. Dried fruits without  added sugar. One hundred percent fruit juice ( cup [237 mL] daily).  Eat Less Often: Dried fruits with added sugar. Canned fruit in light or heavy syrup. YUM[employer]ands, Fish, and Poultry (2 servings or less daily. One serving is 3 to 4 oz [85-114 g]).  Eat More Often: Ninety percent or leaner ground beef, tenderloin, sirloin. Round cuts of beef, chicken breast, [place] breast. All fish. Grill, bake, or broil your meat. Nothing should be fried.  Eat Less Often/Avoid: Fatty cuts of meat, [place], or chicken leg, thigh, or wing. Fried cuts of meat or fish. Dairy (2 to 3 servings)  Eat More Often: Low-fat or fat-free milk, low-fat plain or light yogurt, reduced-fat or part-skim cheese.  Eat Less Often/Avoid: Milk (whole, 2%).Whole milk yogurt. Full-fat cheeses. Nuts, Seeds, and Legumes (4 to 5 servings per week)  Eat More Often: All without added salt.  Eat Less Often/Avoid: Salted nuts and seeds, canned beans with added salt. Fats and Sweets (limited)  Eat More Often: Vegetable oils, tub margarines without trans fats, sugar-free gelatin. Mayonnaise and salad dressings.  Eat Less Often/Avoid: Coconut oils, palm oils, butter, stick margarine, cream, half and half, cookies, candy, pie. FOR MORE INFORMATION The Dash Diet Eating Plan: www.dashdiet.org Document Released: 04/30/2011 Document Revised: 08/03/2011 Document Reviewed: 04/30/2011 [facility] Patient Information 2014 [place], [place].

## 2013-08-31 NOTE — Progress Notes (Signed)
HPI 63 y.o.male presents for 1 month follow up for BP and hypothyroid. His states his BP is fluctuating at home, the lowest you can get is 110/50, and highest is 160/70 but he states the average is 135 at home. He is on Cozaar 100, HCTZ 12.5, Norvasc 5mg , and Atenolol 100mg . HR has been 68-54. Denies SOB, dizziness, headaches. States he has fatigue later in the day but denies morning fatigue.   BP Readings from Last 3 Encounters:  08/31/13 160/88  07/31/13 148/82  04/27/13 160/82   He was started on levothyroxine 24mcg,  Patient denies change in energy level, diarrhea, heat / cold intolerance, nervousness and palpitations.  Lab Results  Component Value Date   TSH 4.636* 07/31/2013  .   Past Medical History  Diagnosis Date  . Hypertension   . Hyperlipidemia   . Diabetes mellitus without complication   . Thyroid disease   . Vitamin D deficiency   . Allergy   . Arthritis     lumbar     No Known Allergies    Current Outpatient Prescriptions on File Prior to Visit  Medication Sig Dispense Refill  . amLODipine (NORVASC) 5 MG tablet Take 5 mg by mouth daily.      [name] aspirin 81 MG tablet Take 81 mg by mouth daily.      [name] atenolol (TENORMIN) 100 MG tablet Take 1 tablet (100 mg total) by mouth daily.  90 tablet  3  . cholecalciferol (VITAMIN D) 1000 UNITS tablet Take 1,000 Units by mouth daily.      . fenofibrate micronized (LOFIBRA) 134 MG capsule take 1 tablet by mouth once daily  30 capsule  3  . hydrochlorothiazide (MICROZIDE) 12.5 MG capsule take 1 capsule by mouth once daily  90 capsule  2  . HYDROcodone-acetaminophen (NORCO/VICODIN) 5-325 MG per tablet Take 1 tablet by mouth every 6 (six) hours as needed for moderate pain.  90 tablet  0  . levothyroxine (SYNTHROID) 50 MCG tablet Take 1 tablet (50 mcg total) by mouth daily.  30 tablet  1  . losartan (COZAAR) 100 MG tablet Take 1 tablet (100 mg total) by mouth daily.  90 tablet  3  . Magnesium 250 MG TABS Take 500 mg by mouth.        . metFORMIN (GLUCOPHAGE) 500 MG tablet Take 1 tablet (500 mg total) by mouth 2 (two) times daily with a meal.  60 tablet  3  . Omega-3 Fatty Acids (FISH OIL PO) Take 1,200 mg by mouth daily.       No current facility-administered medications on file prior to visit.    ROS: all negative expect above.   Physical: Filed Weights   08/31/13 1609  Weight: 259 lb (117.482 kg)   BP 160/88  Pulse 56  Temp(Src) 98.8 F (37.1 C)  Resp 16  Ht 5\' 10"  (1.778 m)  Wt 259 lb (117.482 kg)  BMI 37.16 kg/m2 General Appearance: Well nourished, in no apparent distress. Eyes: PERRLA, EOMs. Sinuses: No Frontal/maxillary tenderness ENT/Mouth: Ext aud canals clear, normal light reflex with TMs without erythema, bulging. Post pharynx without erythema, swelling, exudate.  Respiratory: CTAB Cardio: RRR, no murmurs, rubs or gallops. Peripheral pulses brisk and equal bilaterally, without edema. No aortic or femoral bruits. Abdomen: Soft, with bowl sounds. Nontender, no guarding, rebound. Lymphatics: Non tender without lymphadenopathy.  Musculoskeletal: Full ROM all peripheral extremities, 5/5 strength, and normal gait. Skin: Warm, dry without rashes, lesions, ecchymosis.  Neuro: Cranial nerves intact, reflexes  equal bilaterally. Normal muscle tone, no cerebellar symptoms. Sensation intact.  Pysch: Awake and oriented X 3, normal affect, Insight and Judgment appropriate.   Assessment and Plan: HTN- check BP at home, if above 150/90 take another half of the Norvasc 5mg  Hypothyroid- Check thyroid Obesity- continue weight loss, ? If need sleep study

## 2013-09-01 LAB — TSH: TSH: 2.748 u[IU]/mL (ref 0.350–4.500)

## 2013-09-18 ENCOUNTER — Telehealth: Payer: Self-pay | Admitting: *Deleted

## 2013-09-18 ENCOUNTER — Other Ambulatory Visit: Payer: Self-pay | Admitting: Emergency Medicine

## 2013-09-18 MED ORDER — AMLODIPINE BESYLATE 5 MG PO TABS: 5.0000 mg | ORAL_TABLET | Freq: Two times a day (BID) | ORAL | Status: DC

## 2013-09-18 NOTE — Telephone Encounter (Signed)
NEW DOSE FOR NORVASC =  5MG  1BID

## 2013-09-25 ENCOUNTER — Other Ambulatory Visit: Payer: Self-pay | Admitting: Physician Assistant

## 2013-09-27 ENCOUNTER — Other Ambulatory Visit: Payer: Self-pay | Admitting: Internal Medicine

## 2013-11-01 ENCOUNTER — Ambulatory Visit (INDEPENDENT_AMBULATORY_CARE_PROVIDER_SITE_OTHER): Payer: BC Managed Care – PPO | Admitting: Physician Assistant

## 2013-11-01 ENCOUNTER — Encounter: Payer: Self-pay | Admitting: Physician Assistant

## 2013-11-01 VITALS — BP 130/78 | HR 60 | Temp 98.1°F | Resp 16 | Ht 70.0 in | Wt 259.0 lb

## 2013-11-01 DIAGNOSIS — I10 Essential (primary) hypertension: Secondary | ICD-10-CM

## 2013-11-01 DIAGNOSIS — E559 Vitamin D deficiency, unspecified: Secondary | ICD-10-CM

## 2013-11-01 DIAGNOSIS — E119 Type 2 diabetes mellitus without complications: Secondary | ICD-10-CM

## 2013-11-01 DIAGNOSIS — E785 Hyperlipidemia, unspecified: Secondary | ICD-10-CM

## 2013-11-01 DIAGNOSIS — E079 Disorder of thyroid, unspecified: Secondary | ICD-10-CM

## 2013-11-01 DIAGNOSIS — E669 Obesity, unspecified: Secondary | ICD-10-CM

## 2013-11-01 DIAGNOSIS — Z79899 Other long term (current) drug therapy: Secondary | ICD-10-CM

## 2013-11-01 LAB — CBC WITH DIFFERENTIAL/PLATELET
BASOS ABS: 0 10*3/uL (ref 0.0–0.1)
Basophils Relative: 1 % (ref 0–1)
EOS ABS: 0.1 10*3/uL (ref 0.0–0.7)
EOS PCT: 2 % (ref 0–5)
HCT: 43.7 % (ref 39.0–52.0)
Hemoglobin: 15.1 g/dL (ref 13.0–17.0)
Lymphocytes Relative: 40 % (ref 12–46)
Lymphs Abs: 1.8 10*3/uL (ref 0.7–4.0)
MCH: 32.6 pg (ref 26.0–34.0)
MCHC: 34.6 g/dL (ref 30.0–36.0)
MCV: 94.4 fL (ref 78.0–100.0)
Monocytes Absolute: 0.6 10*3/uL (ref 0.1–1.0)
Monocytes Relative: 14 % — ABNORMAL HIGH (ref 3–12)
Neutro Abs: 1.9 10*3/uL (ref 1.7–7.7)
Neutrophils Relative %: 43 % (ref 43–77)
PLATELETS: 260 10*3/uL (ref 150–400)
RBC: 4.63 MIL/uL (ref 4.22–5.81)
RDW: 14.1 % (ref 11.5–15.5)
WBC: 4.4 10*3/uL (ref 4.0–10.5)

## 2013-11-01 NOTE — Progress Notes (Signed)
Assessment and Plan:  Hypertension: Continue medication, monitor blood pressure at home. Continue DASH diet. Cholesterol: Continue diet and exercise. Check cholesterol.  Diabetes-Continue diet and exercise. Check A1C Vitamin D Def- check level and continue medications.   Continue diet and meds as discussed. Further disposition pending results of labs. Discussed med's effects and SE's.    HPI 63 y.o. male  presents for 3 month follow up with hypertension, hyperlipidemia, diabetes and vitamin D. His blood pressure has been controlled at home, today their BP is BP: 130/78 mmHg He does not workout but stays very active. He denies chest pain, shortness of breath, dizziness.  He is on cholesterol medication and denies myalgias. His cholesterol is at goal. The cholesterol last visit was:   Lab Results  Component Value Date   CHOL 176 07/31/2013   HDL 38* 07/31/2013   LDLCALC 99 07/31/2013   TRIG 195* 07/31/2013   CHOLHDL 4.6 07/31/2013   He has been working on diet and exercise for Diabetes, sugars run 116-122, and denies polydipsia and polyuria. Last A1C in the office was:  Lab Results  Component Value Date   HGBA1C 6.8* 07/31/2013   Patient is on Vitamin D supplement.   He states that last month he was strapping down a cord on a car and slipped, he was still holding onto the cord so he did not fall down all the way however he has been taking extra vicodin due to pain.  Current Medications:  Current Outpatient Prescriptions on File Prior to Visit  Medication Sig Dispense Refill  . amLODipine (NORVASC) 5 MG tablet Take 1 tablet (5 mg total) by mouth 2 (two) times daily.  60 tablet  3  . aspirin 81 MG tablet Take 81 mg by mouth daily.      [name] atenolol (TENORMIN) 100 MG tablet Take 1 tablet (100 mg total) by mouth daily.  90 tablet  3  . cholecalciferol (VITAMIN D) 1000 UNITS tablet Take 1,000 Units by mouth daily.      . fenofibrate micronized (LOFIBRA) 134 MG capsule take 1 tablet by mouth once daily   30 capsule  3  . hydrochlorothiazide (MICROZIDE) 12.5 MG capsule take 1 capsule by mouth once daily  90 capsule  2  . HYDROcodone-acetaminophen (NORCO/VICODIN) 5-325 MG per tablet Take 1 tablet by mouth every 6 (six) hours as needed for moderate pain.  90 tablet  0  . levothyroxine (SYNTHROID, LEVOTHROID) 50 MCG tablet take 1 tablet by mouth once daily  30 tablet  1  . losartan (COZAAR) 100 MG tablet Take 1 tablet (100 mg total) by mouth daily.  90 tablet  3  . Magnesium 250 MG TABS Take 500 mg by mouth.       . metFORMIN (GLUCOPHAGE) 500 MG tablet take 1 tablet by mouth twice a day with food  60 tablet  3  . Omega-3 Fatty Acids (FISH OIL PO) Take 1,200 mg by mouth daily.       No current facility-administered medications on file prior to visit.   Medical History:  Past Medical History  Diagnosis Date  . Hypertension   . Hyperlipidemia   . Diabetes mellitus without complication   . Thyroid disease   . Vitamin D deficiency   . Allergy   . Arthritis     lumbar   Allergies: No Known Allergies   Review of Systems: [X]  = complains of  [ ]  = denies  General: Fatigue [ ]  Fever [ ]  Chills [ ]   Weakness [ ]   Insomnia [ ]  Eyes: Redness [ ]  Blurred vision [ ]  Diplopia [ ]   ENT: Congestion [ ]  Sinus Pain [ ]  Post Nasal Drip [ ]  Sore Throat [ ]  Earache [ ]   Cardiac: Chest pain/pressure [ ]  SOB [ ]  Orthopnea [ ]   Palpitations [ ]   Paroxysmal nocturnal dyspnea[ ]  Claudication [ ]  Edema [ ]   Pulmonary: Cough [ ]  Wheezing[ ]   SOB [ ]   Snoring [ ]   GI: Nausea [ ]  Vomiting[ ]  Dysphagia[ ]  Heartburn[ ]  Abdominal pain [ ]  Constipation [ ] ; Diarrhea [ ] ; BRBPR [ ]  Melena[ ]  GU: Hematuria[ ]  Dysuria [ ]  Nocturia[ ]  Urgency [ ]   Hesitancy [ ]  Discharge [ ]  Neuro: Headaches[ ]  Vertigo[ ]  Paresthesias[ ]  Spasm [ ]  Speech changes [ ]  Incoordination [ ]   Ortho: Arthritis [ ]  Joint pain [ ]  Muscle pain [ ]  Joint swelling [ ]  Back Pain [ X] Skin:  Rash [ ]   Pruritis [ ]  Change in skin lesion [ ]   Psych:  Depression[ ]  Anxiety[ ]  Confusion [ ]  Memory loss [ ]   Heme/Lypmh: Bleeding [ ]  Bruising [ ]  Enlarged lymph nodes [ ]   Endocrine: Visual blurring [ ]  Paresthesia [ ]  Polyuria [ ]  Polydypsea [ ]    Heat/cold intolerance [ ]  Hypoglycemia [ ]   Family history- Review and unchanged Social history- Review and unchanged Physical Exam: BP 130/78  Pulse 60  Temp(Src) 98.1 F (36.7 C)  Resp 16  Ht 5\' 10"  (1.778 m)  Wt 259 lb (117.482 kg)  BMI 37.16 kg/m2 Wt Readings from Last 3 Encounters:  11/01/13 259 lb (117.482 kg)  08/31/13 259 lb (117.482 kg)  07/31/13 261 lb (118.389 kg)   General Appearance: Well nourished, in no apparent distress. Eyes: PERRLA, EOMs, conjunctiva no swelling or erythema Sinuses: No Frontal/maxillary tenderness ENT/Mouth: Ext aud canals clear, TMs without erythema, bulging. No erythema, swelling, or exudate on post pharynx.  Tonsils not swollen or erythematous. Hearing normal.  Neck: Supple, thyroid normal.  Respiratory: Respiratory effort normal, BS equal bilaterally without rales, rhonchi, wheezing or stridor.  Cardio: RRR with no MRGs. Brisk peripheral pulses without edema.  Abdomen: Soft, + BS.  Non tender, no guarding, rebound, hernias, masses. Lymphatics: Non tender without lymphadenopathy.  Musculoskeletal: Full ROM, 5/5 strength, normal gait.  Skin: Warm, dry without rashes, lesions, ecchymosis.  Neuro: Cranial nerves intact. No cerebellar symptoms. Sensation intact.  Psych: Awake and oriented X 3, normal affect, Insight and Judgment appropriate.    Vicie Mutters 4:43 PM

## 2013-11-01 NOTE — Patient Instructions (Signed)
Bad carbs also include fruit juice, alcohol, and sweet tea. These are empty calories that do not signal to your brain that you are full.   Please remember the good carbs are still carbs which convert into sugar. So please measure them out no more than 1/2-1 cup of rice, oatmeal, pasta, and beans.  Veggies are however free foods! Pile them on.   I like lean protein at every meal such as chicken, [place], pork chops, cottage cheese, etc. Just do not fry these meats and please center your meal around vegetable, the meats should be a side dish.   No all fruit is created equal. Please see the list below, the fruit at the bottom is higher in sugars than the fruit at the top   Diabetes and Small Vessel Disease Small vessel disease (microvascular disease) includes nephropathy, retinopathy, and neuropathy. People with diabetes are at risk for these problems, but keeping blood glucose (sugar) controlled is helpful in preventing problems. DIABETIC KIDNEY PROBLEMS (DIABETIC NEPHROPATHY)  Diabetic nephropathy occurs in many patients with diabetes.  Damage to the small vessels in the kidneys is the leading cause of end-stage renal disease (ESRD).  Protein in the urine (albuminuria) in the range of 30 to 300 mg/24 h (microalbuminuria) is a sign of the earliest stage of diabetic nephropathy.  Good blood glucose (sugar) and blood pressure control significantly reduce the progression of nephropathy. DIABETIC EYE PROBLEMS (DIABETIC RETINOPATHY)  Diabetic retinopathy is the most common cause of new cases of blindness in adults. It is related to the number of years you have had diabetes.  Common risk factors include high blood sugar (hyperglycemia), high blood pressure (hypertension), and poorly controlled blood lipids such as high blood cholesterol (hypercholesterolemia). DIABETIC NERVE PROBLEMS (DIABETIC NEUROPATHY) Diabetic neuropathy is the most common, long-term complication of diabetes. It is  responsible for more than half of leg amputations not due to accidents. The main risk for developing diabetic neuropathy seems to be uncontrolled blood sugars. Hyperglycemia damages the nerve fibers causing sensation (feeling) problems. The closer you can keep the following guidelines, the better chance you will have avoiding problems from small vessel disease.  Working toward near normal blood glucose or as normal as possible. You will need to keep your blood glucose and A1c at the target range prescribed by your caregiver.  Keep your blood pressure less than 120/80.  Keep your low-density lipoprotein (LDL) cholesterol (one of the fats in your blood) at less than 100 mg/dL. An LDL less than 70 mg/dL may be recommended for high risk patients. You cannot change your family history, but it is important to change the risk factors that you can. Risk factors you can control include:  Controlling high blood pressure.  Stopping smoking.  Using alcohol only in moderation. Generally, this means about one drink per day for women and two drinks per day for men.  Controlling your blood lipids (cholesterol and triglycerides).  Treating heart problems, if these are contributing to risk. SEEK MEDICAL CARE IF:   You are having problems keeping your blood glucose in goal range.  You notice a change in your vision or new problems with your vision.  You have wound or sore that does not heal.  Your blood pressure is above the target range. Document Released: 05/14/2003 Document Revised: 04/27/2012 Document Reviewed: 10/19/2008 [facility] Patient Information 2014 [place], [place].  DASH Diet The DASH diet stands for "Dietary Approaches to Stop Hypertension." It is a healthy eating plan that has been shown  to reduce high blood pressure (hypertension) in as little as 14 days, while also possibly providing other significant health benefits. These other health benefits include reducing the risk of breast cancer  after menopause and reducing the risk of type 2 diabetes, heart disease, colon cancer, and stroke. Health benefits also include weight loss and slowing kidney failure in patients with chronic kidney disease.  DIET GUIDELINES  Limit salt (sodium). Your diet should contain less than 1500 mg of sodium daily.  Limit refined or processed carbohydrates. Your diet should include mostly whole grains. Desserts and added sugars should be used sparingly.  Include small amounts of heart-healthy fats. These types of fats include nuts, oils, and tub margarine. Limit saturated and trans fats. These fats have been shown to be harmful in the body. CHOOSING FOODS  The following food groups are based on a 2000 calorie diet. See your Registered Dietitian for individual calorie needs. Grains and Grain Products (6 to 8 servings daily)  Eat More Often: Whole-wheat bread, brown rice, whole-grain or wheat pasta, quinoa, popcorn without added fat or salt (air popped).  Eat Less Often: White bread, white pasta, white rice, cornbread. Vegetables (4 to 5 servings daily)  Eat More Often: Fresh, frozen, and canned vegetables. Vegetables may be raw, steamed, roasted, or grilled with a minimal amount of fat.  Eat Less Often/Avoid: Creamed or fried vegetables. Vegetables in a cheese sauce. Fruit (4 to 5 servings daily)  Eat More Often: All fresh, canned (in natural juice), or frozen fruits. Dried fruits without added sugar. One hundred percent fruit juice ( cup [237 mL] daily).  Eat Less Often: Dried fruits with added sugar. Canned fruit in light or heavy syrup. [employer], Fish, and Poultry (2 servings or less daily. One serving is 3 to 4 oz [85-114 g]).  Eat More Often: Ninety percent or leaner ground beef, tenderloin, sirloin. Round cuts of beef, chicken breast, [place] breast. All fish. Grill, bake, or broil your meat. Nothing should be fried.  Eat Less Often/Avoid: Fatty cuts of meat, [place], or chicken leg, thigh,  or wing. Fried cuts of meat or fish. Dairy (2 to 3 servings)  Eat More Often: Low-fat or fat-free milk, low-fat plain or light yogurt, reduced-fat or part-skim cheese.  Eat Less Often/Avoid: Milk (whole, 2%).Whole milk yogurt. Full-fat cheeses. Nuts, Seeds, and Legumes (4 to 5 servings per week)  Eat More Often: All without added salt.  Eat Less Often/Avoid: Salted nuts and seeds, canned beans with added salt. Fats and Sweets (limited)  Eat More Often: Vegetable oils, tub margarines without trans fats, sugar-free gelatin. Mayonnaise and salad dressings.  Eat Less Often/Avoid: Coconut oils, palm oils, butter, stick margarine, cream, half and half, cookies, candy, pie. FOR MORE INFORMATION The Dash Diet Eating Plan: www.dashdiet.org Document Released: 04/30/2011 Document Revised: 08/03/2011 Document Reviewed: 04/30/2011 [facility] Patient Information 2014 [place], [place].

## 2013-11-02 LAB — BASIC METABOLIC PANEL WITH GFR
BUN: 26 mg/dL — ABNORMAL HIGH (ref 6–23)
CALCIUM: 9.6 mg/dL (ref 8.4–10.5)
CO2: 24 mEq/L (ref 19–32)
CREATININE: 1.72 mg/dL — AB (ref 0.50–1.35)
Chloride: 101 mEq/L (ref 96–112)
GFR, Est African American: 48 mL/min — ABNORMAL LOW
GFR, Est Non African American: 41 mL/min — ABNORMAL LOW
Glucose, Bld: 130 mg/dL — ABNORMAL HIGH (ref 70–99)
Potassium: 4.3 mEq/L (ref 3.5–5.3)
SODIUM: 141 meq/L (ref 135–145)

## 2013-11-02 LAB — LIPID PANEL
CHOLESTEROL: 156 mg/dL (ref 0–200)
HDL: 42 mg/dL (ref >39)
LDL Cholesterol: 79 mg/dL (ref 0–99)
TRIGLYCERIDES: 177 mg/dL — AB (ref <150)
Total CHOL/HDL Ratio: 3.7 Ratio
VLDL: 35 mg/dL (ref 0–40)

## 2013-11-02 LAB — HEMOGLOBIN A1C
HEMOGLOBIN A1C: 6.8 % — AB (ref <5.7)
Mean Plasma Glucose: 148 mg/dL — ABNORMAL HIGH (ref <117)

## 2013-11-02 LAB — HEPATIC FUNCTION PANEL
ALBUMIN: 4.5 g/dL (ref 3.5–5.2)
ALT: 26 U/L (ref 0–53)
AST: 22 U/L (ref 0–37)
Alkaline Phosphatase: 60 U/L (ref 39–117)
BILIRUBIN DIRECT: 0.1 mg/dL (ref 0.0–0.3)
BILIRUBIN TOTAL: 0.5 mg/dL (ref 0.2–1.2)
Indirect Bilirubin: 0.4 mg/dL (ref 0.2–1.2)
Total Protein: 7.4 g/dL (ref 6.0–8.3)

## 2013-11-02 LAB — MAGNESIUM: MAGNESIUM: 2 mg/dL (ref 1.5–2.5)

## 2013-11-02 LAB — INSULIN, FASTING: Insulin fasting, serum: 32 u[IU]/mL — ABNORMAL HIGH (ref 3–28)

## 2013-11-02 LAB — VITAMIN D 25 HYDROXY (VIT D DEFICIENCY, FRACTURES): VIT D 25 HYDROXY: 70 ng/mL (ref 30–89)

## 2013-11-02 LAB — TSH: TSH: 2.697 u[IU]/mL (ref 0.350–4.500)

## 2013-11-16 ENCOUNTER — Other Ambulatory Visit: Payer: Self-pay

## 2013-11-21 ENCOUNTER — Other Ambulatory Visit: Payer: BC Managed Care – PPO

## 2013-11-21 DIAGNOSIS — N289 Disorder of kidney and ureter, unspecified: Secondary | ICD-10-CM

## 2013-11-22 ENCOUNTER — Other Ambulatory Visit: Payer: Self-pay | Admitting: Physician Assistant

## 2013-11-22 LAB — BASIC METABOLIC PANEL WITH GFR
BUN: 24 mg/dL — AB (ref 6–23)
CALCIUM: 10 mg/dL (ref 8.4–10.5)
CO2: 27 mEq/L (ref 19–32)
CREATININE: 1.15 mg/dL (ref 0.50–1.35)
Chloride: 103 mEq/L (ref 96–112)
GFR, EST AFRICAN AMERICAN: 78 mL/min
GFR, EST NON AFRICAN AMERICAN: 67 mL/min
GLUCOSE: 105 mg/dL — AB (ref 70–99)
Potassium: 4.2 mEq/L (ref 3.5–5.3)
Sodium: 141 mEq/L (ref 135–145)

## 2014-01-03 ENCOUNTER — Other Ambulatory Visit: Payer: Self-pay | Admitting: Physician Assistant

## 2014-01-03 MED ORDER — HYDROCODONE-ACETAMINOPHEN 5-325 MG PO TABS: 1.0000 | ORAL_TABLET | Freq: Four times a day (QID) | ORAL | Status: DC | PRN

## 2014-01-11 ENCOUNTER — Other Ambulatory Visit: Payer: Self-pay | Admitting: *Deleted

## 2014-01-11 MED ORDER — AMLODIPINE BESYLATE 5 MG PO TABS: 5.0000 mg | ORAL_TABLET | Freq: Two times a day (BID) | ORAL | Status: DC

## 2014-01-26 ENCOUNTER — Other Ambulatory Visit: Payer: Self-pay | Admitting: Physician Assistant

## 2014-01-27 ENCOUNTER — Other Ambulatory Visit: Payer: Self-pay | Admitting: Internal Medicine

## 2014-02-08 ENCOUNTER — Ambulatory Visit: Payer: Self-pay | Admitting: Physician Assistant

## 2014-02-10 ENCOUNTER — Other Ambulatory Visit: Payer: Self-pay | Admitting: Internal Medicine

## 2014-02-22 ENCOUNTER — Ambulatory Visit (INDEPENDENT_AMBULATORY_CARE_PROVIDER_SITE_OTHER): Payer: BC Managed Care – PPO | Admitting: Physician Assistant

## 2014-02-22 ENCOUNTER — Encounter: Payer: Self-pay | Admitting: Physician Assistant

## 2014-02-22 VITALS — BP 138/82 | HR 52 | Temp 97.8°F | Resp 16 | Ht 70.0 in | Wt 255.0 lb

## 2014-02-22 DIAGNOSIS — E079 Disorder of thyroid, unspecified: Secondary | ICD-10-CM

## 2014-02-22 DIAGNOSIS — E785 Hyperlipidemia, unspecified: Secondary | ICD-10-CM

## 2014-02-22 DIAGNOSIS — E119 Type 2 diabetes mellitus without complications: Secondary | ICD-10-CM

## 2014-02-22 DIAGNOSIS — I10 Essential (primary) hypertension: Secondary | ICD-10-CM

## 2014-02-22 DIAGNOSIS — E669 Obesity, unspecified: Secondary | ICD-10-CM

## 2014-02-22 DIAGNOSIS — Z23 Encounter for immunization: Secondary | ICD-10-CM

## 2014-02-22 DIAGNOSIS — Z79899 Other long term (current) drug therapy: Secondary | ICD-10-CM

## 2014-02-22 DIAGNOSIS — E559 Vitamin D deficiency, unspecified: Secondary | ICD-10-CM

## 2014-02-22 LAB — CBC WITH DIFFERENTIAL/PLATELET
BASOS PCT: 1 % (ref 0–1)
Basophils Absolute: 0.1 10*3/uL (ref 0.0–0.1)
Eosinophils Absolute: 0.2 10*3/uL (ref 0.0–0.7)
Eosinophils Relative: 3 % (ref 0–5)
HCT: 43.6 % (ref 39.0–52.0)
Hemoglobin: 15.1 g/dL (ref 13.0–17.0)
Lymphocytes Relative: 32 % (ref 12–46)
Lymphs Abs: 2.3 10*3/uL (ref 0.7–4.0)
MCH: 33 pg (ref 26.0–34.0)
MCHC: 34.6 g/dL (ref 30.0–36.0)
MCV: 95.4 fL (ref 78.0–100.0)
MONOS PCT: 9 % (ref 3–12)
Monocytes Absolute: 0.7 10*3/uL (ref 0.1–1.0)
NEUTROS ABS: 4 10*3/uL (ref 1.7–7.7)
NEUTROS PCT: 55 % (ref 43–77)
Platelets: 267 10*3/uL (ref 150–400)
RBC: 4.57 MIL/uL (ref 4.22–5.81)
RDW: 14 % (ref 11.5–15.5)
WBC: 7.3 10*3/uL (ref 4.0–10.5)

## 2014-02-22 LAB — LIPID PANEL
Cholesterol: 171 mg/dL (ref 0–200)
HDL: 42 mg/dL (ref >39)
LDL CALC: 100 mg/dL — AB (ref 0–99)
TRIGLYCERIDES: 147 mg/dL (ref <150)
Total CHOL/HDL Ratio: 4.1 Ratio
VLDL: 29 mg/dL (ref 0–40)

## 2014-02-22 LAB — BASIC METABOLIC PANEL WITH GFR
BUN: 17 mg/dL (ref 6–23)
CALCIUM: 9.7 mg/dL (ref 8.4–10.5)
CO2: 27 mEq/L (ref 19–32)
Chloride: 104 mEq/L (ref 96–112)
Creat: 1.09 mg/dL (ref 0.50–1.35)
GFR, Est African American: 83 mL/min
GFR, Est Non African American: 72 mL/min
GLUCOSE: 106 mg/dL — AB (ref 70–99)
POTASSIUM: 4 meq/L (ref 3.5–5.3)
SODIUM: 142 meq/L (ref 135–145)

## 2014-02-22 LAB — HEPATIC FUNCTION PANEL
ALT: 23 U/L (ref 0–53)
AST: 20 U/L (ref 0–37)
Albumin: 4.4 g/dL (ref 3.5–5.2)
Alkaline Phosphatase: 66 U/L (ref 39–117)
BILIRUBIN INDIRECT: 0.5 mg/dL (ref 0.2–1.2)
BILIRUBIN TOTAL: 0.6 mg/dL (ref 0.2–1.2)
Bilirubin, Direct: 0.1 mg/dL (ref 0.0–0.3)
Total Protein: 7.3 g/dL (ref 6.0–8.3)

## 2014-02-22 LAB — TSH: TSH: 1.883 u[IU]/mL (ref 0.350–4.500)

## 2014-02-22 LAB — MAGNESIUM: Magnesium: 1.7 mg/dL (ref 1.5–2.5)

## 2014-02-22 NOTE — Patient Instructions (Signed)

## 2014-02-22 NOTE — Progress Notes (Signed)
Assessment and Plan:  Hypertension: Continue medication, monitor blood pressure at home. Continue DASH diet. Cholesterol: Continue diet and exercise. Check cholesterol.  Diabetes-Continue diet and exercise. Check A1C Vitamin D Def- check level and continue medications.  Hypothyroidism-check TSH level, continue medications the same.  Obesity with co morbidities- long discussion about weight loss, diet, and exercise   Continue diet and meds as discussed. Further disposition pending results of labs. Discussed med's effects and SE's.    HPI 63 y.o. male  presents for 3 month follow up with hypertension, hyperlipidemia, diabetes and vitamin D. His blood pressure has been controlled at home, today their BP is BP: 138/82 mmHg He does not workout but is active at work. He denies chest pain, shortness of breath, dizziness.  He is not on cholesterol medication and denies myalgias. His cholesterol is at goal. The cholesterol last visit was:   Lab Results  Component Value Date   CHOL 156 11/01/2013   HDL 42 11/01/2013   LDLCALC 79 11/01/2013   TRIG 177* 11/01/2013   CHOLHDL 3.7 11/01/2013   He has been working on diet and exercise for Diabetes with CKD stage II, he is on MF and ARB and bASA, and denies paresthesia of the feet, polydipsia and polyuria. Last A1C in the office was:  Lab Results  Component Value Date   HGBA1C 6.8* 11/01/2013   Patient is on Vitamin D supplement. Lab Results  Component Value Date   VD25OH 3 11/01/2013     He is on thyroid medication. His medication was not changed last visit. Patient denies nervousness, palpitations and weight changes.  Lab Results  Component Value Date   TSH 2.697 11/01/2013  .  BMI is Body mass index is 36.59 kg/(m^2)., he is working on diet and exercise and has done well.  Wt Readings from Last 3 Encounters:  02/22/14 255 lb (115.667 kg)  11/01/13 259 lb (117.482 kg)  08/31/13 259 lb (117.482 kg)    Current Medications:  Current Outpatient  Prescriptions on File Prior to Visit  Medication Sig Dispense Refill  . amLODipine (NORVASC) 5 MG tablet Take 1 tablet (5 mg total) by mouth 2 (two) times daily.  60 tablet  3  . aspirin 81 MG tablet Take 81 mg by mouth daily.      [name] atenolol (TENORMIN) 100 MG tablet Take 1 tablet (100 mg total) by mouth daily.  90 tablet  3  . cholecalciferol (VITAMIN D) 1000 UNITS tablet Take 1,000 Units by mouth daily.      . fenofibrate micronized (LOFIBRA) 134 MG capsule take 1 capsule by mouth once daily  30 capsule  3  . HYDROcodone-acetaminophen (NORCO/VICODIN) 5-325 MG per tablet Take 1 tablet by mouth every 6 (six) hours as needed for moderate pain.  90 tablet  0  . levothyroxine (SYNTHROID, LEVOTHROID) 50 MCG tablet take 1 tablet by mouth once daily  30 tablet  3  . losartan (COZAAR) 100 MG tablet Take 1 tablet (100 mg total) by mouth daily.  90 tablet  3  . Magnesium 250 MG TABS Take 500 mg by mouth.       . metFORMIN (GLUCOPHAGE) 500 MG tablet take 1 tablet by mouth twice a day with food  60 tablet  3  . Omega-3 Fatty Acids (FISH OIL PO) Take 1,200 mg by mouth daily.       No current facility-administered medications on file prior to visit.   Medical History:  Past Medical History  Diagnosis Date  .  Hypertension   . Hyperlipidemia   . Diabetes mellitus without complication   . Thyroid disease   . Vitamin D deficiency   . Allergy   . Arthritis     lumbar   Allergies: No Known Allergies   Review of Systems: [X]  = complains of  [ ]  = denies  General: Fatigue [ ]  Fever [ ]  Chills [ ]  Weakness [ ]   Insomnia [ ]  Eyes: Redness [ ]  Blurred vision [ ]  Diplopia [ ]   ENT: Congestion [ ]  Sinus Pain [ ]  Post Nasal Drip [ ]  Sore Throat [ ]  Earache [ ]   Cardiac: Chest pain/pressure [ ]  SOB [ ]  Orthopnea [ ]   Palpitations [ ]   Paroxysmal nocturnal dyspnea[ ]  Claudication [ ]  Edema [ ]   Pulmonary: Cough [ ]  Wheezing[ ]   SOB [ ]   Snoring [ ]   GI: Nausea [ ]  Vomiting[ ]  Dysphagia[ ]  Heartburn[ ]   Abdominal pain [ ]  Constipation [ ] ; Diarrhea [ ] ; BRBPR [ ]  Melena[ ]  GU: Hematuria[ ]  Dysuria [ ]  Nocturia[ ]  Urgency [ ]   Hesitancy [ ]  Discharge [ ]  Neuro: Headaches[ ]  Vertigo[ ]  Paresthesias[ ]  Spasm [ ]  Speech changes [ ]  Incoordination [ ]   Ortho: Arthritis [ ]  Joint pain [ ]  Muscle pain [ ]  Joint swelling [ ]  Back Pain [ ]  Skin:  Rash [ ]   Pruritis [ ]  Change in skin lesion [ ]   Psych: Depression[ ]  Anxiety[ ]  Confusion [ ]  Memory loss [ ]   Heme/Lypmh: Bleeding [ ]  Bruising [ ]  Enlarged lymph nodes [ ]   Endocrine: Visual blurring [ ]  Paresthesia [ ]  Polyuria [ ]  Polydypsea [ ]    Heat/cold intolerance [ ]  Hypoglycemia [ ]   Family history- Review and unchanged Social history- Review and unchanged Physical Exam: BP 138/82  Pulse 52  Temp(Src) 97.8 F (36.6 C) (Temporal)  Resp 16  Ht 5\' 10"  (1.778 m)  Wt 255 lb (115.667 kg)  BMI 36.59 kg/m2 Wt Readings from Last 3 Encounters:  02/22/14 255 lb (115.667 kg)  11/01/13 259 lb (117.482 kg)  08/31/13 259 lb (117.482 kg)   General Appearance: Well nourished, in no apparent distress. Eyes: PERRLA, EOMs, conjunctiva no swelling or erythema Sinuses: No Frontal/maxillary tenderness ENT/Mouth: Ext aud canals clear, TMs without erythema, bulging. No erythema, swelling, or exudate on post pharynx.  Tonsils not swollen or erythematous. Hearing normal.  Neck: Supple, thyroid normal.  Respiratory: Respiratory effort normal, BS equal bilaterally without rales, rhonchi, wheezing or stridor.  Cardio: RRR with no MRGs. Brisk peripheral pulses without edema.  Abdomen: Soft, + BS.  Non tender, no guarding, rebound, hernias, masses. Lymphatics: Non tender without lymphadenopathy.  Musculoskeletal: Full ROM, 5/5 strength, normal gait.  Skin: Warm, dry without rashes, lesions, ecchymosis.  Neuro: Cranial nerves intact. No cerebellar symptoms. Sensation intact.  Psych: Awake and oriented X 3, normal affect, Insight and Judgment appropriate.     Vicie Mutters 4:33 PM

## 2014-02-23 LAB — VITAMIN D 25 HYDROXY (VIT D DEFICIENCY, FRACTURES): Vit D, 25-Hydroxy: 58 ng/mL (ref 30–89)

## 2014-02-23 LAB — HEMOGLOBIN A1C
Hgb A1c MFr Bld: 6.6 % — ABNORMAL HIGH (ref <5.7)
Mean Plasma Glucose: 143 mg/dL — ABNORMAL HIGH (ref <117)

## 2014-04-05 ENCOUNTER — Ambulatory Visit (INDEPENDENT_AMBULATORY_CARE_PROVIDER_SITE_OTHER): Payer: BC Managed Care – PPO | Admitting: *Deleted

## 2014-04-05 ENCOUNTER — Other Ambulatory Visit: Payer: Self-pay | Admitting: Physician Assistant

## 2014-04-05 DIAGNOSIS — Z23 Encounter for immunization: Secondary | ICD-10-CM

## 2014-04-05 MED ORDER — HYDROCODONE-ACETAMINOPHEN 5-325 MG PO TABS: 1.0000 | ORAL_TABLET | Freq: Four times a day (QID) | ORAL | Status: DC | PRN

## 2014-04-22 ENCOUNTER — Other Ambulatory Visit: Payer: Self-pay | Admitting: Physician Assistant

## 2014-05-01 ENCOUNTER — Ambulatory Visit (INDEPENDENT_AMBULATORY_CARE_PROVIDER_SITE_OTHER): Payer: BC Managed Care – PPO | Admitting: Physician Assistant

## 2014-05-01 ENCOUNTER — Encounter: Payer: Self-pay | Admitting: Physician Assistant

## 2014-05-01 VITALS — BP 140/84 | HR 56 | Temp 98.5°F | Resp 16 | Ht 70.0 in | Wt 258.0 lb

## 2014-05-01 DIAGNOSIS — Z0001 Encounter for general adult medical examination with abnormal findings: Secondary | ICD-10-CM

## 2014-05-01 DIAGNOSIS — E785 Hyperlipidemia, unspecified: Secondary | ICD-10-CM

## 2014-05-01 DIAGNOSIS — R001 Bradycardia, unspecified: Secondary | ICD-10-CM

## 2014-05-01 DIAGNOSIS — Z125 Encounter for screening for malignant neoplasm of prostate: Secondary | ICD-10-CM

## 2014-05-01 DIAGNOSIS — G4710 Hypersomnia, unspecified: Secondary | ICD-10-CM

## 2014-05-01 DIAGNOSIS — Z79899 Other long term (current) drug therapy: Secondary | ICD-10-CM

## 2014-05-01 DIAGNOSIS — R6889 Other general symptoms and signs: Secondary | ICD-10-CM

## 2014-05-01 DIAGNOSIS — G4730 Sleep apnea, unspecified: Secondary | ICD-10-CM

## 2014-05-01 DIAGNOSIS — I10 Essential (primary) hypertension: Secondary | ICD-10-CM

## 2014-05-01 DIAGNOSIS — E559 Vitamin D deficiency, unspecified: Secondary | ICD-10-CM

## 2014-05-01 DIAGNOSIS — N182 Chronic kidney disease, stage 2 (mild): Secondary | ICD-10-CM

## 2014-05-01 DIAGNOSIS — E1122 Type 2 diabetes mellitus with diabetic chronic kidney disease: Secondary | ICD-10-CM

## 2014-05-01 DIAGNOSIS — E669 Obesity, unspecified: Secondary | ICD-10-CM

## 2014-05-01 DIAGNOSIS — B353 Tinea pedis: Secondary | ICD-10-CM

## 2014-05-01 DIAGNOSIS — Z23 Encounter for immunization: Secondary | ICD-10-CM

## 2014-05-01 DIAGNOSIS — E079 Disorder of thyroid, unspecified: Secondary | ICD-10-CM

## 2014-05-01 MED ORDER — NYSTATIN 100000 UNIT/GM EX CREA: 1.0000 "application " | TOPICAL_CREAM | Freq: Two times a day (BID) | CUTANEOUS | Status: DC

## 2014-05-01 MED ORDER — VALSARTAN 320 MG PO TABS: 320.0000 mg | ORAL_TABLET | Freq: Every day | ORAL | Status: DC

## 2014-05-01 NOTE — Progress Notes (Signed)
Complete Physical  Assessment and Plan: 1. Essential hypertension - continue medications, DASH diet, exercise and monitor at home. Call if greater than 130/80.  Cut atenolol in half, stop cozaar switch to diovan, monitor BP - EKG 12-Lead - [place], RETROPERITNL ABD,  LTD  2. DM type 2 causing CKD stage 2 Discussed general issues about diabetes pathophysiology and management., Educational material distributed., Suggested low cholesterol diet., Encouraged aerobic exercise., Discussed foot care., Reminded to get yearly retinal exam. - [facility] DIABETES FOOT EXAM  3. Thyroid disease Hypothyroidism-check TSH level, continue medications the same, reminded to take on an empty stomach 30-27mins before food.   4. Hyperlipidemia -continue medications, check lipids, decrease fatty foods, increase activity.   5. Vitamin D deficiency Continue supplement  6. Encounter for long-term (current) use of medications  7. Obesity (BMI 30-39.9) Obesity with co morbidities- long discussion about weight loss, diet, and exercise - Ambulatory referral to Sleep Studies  8. Encounter for general adult medical examination with abnormal findings - CBC with Differential - BASIC METABOLIC PANEL WITH GFR - Hepatic function panel - Lipid panel - TSH - Hemoglobin A1c - Insulin, fasting - Vit D  25 hydroxy (rtn osteoporosis monitoring) - Urinalysis, Routine w reflex microscopic - Microalbumin / creatinine urine ratio - Vitamin B12 - Magnesium - Iron and TIBC - Ferritin - Testosterone  9. Prostate cancer screening - PSA  10. Sinus bradycardia Cut atenolol in half - Ambulatory referral to Sleep Studies  11. Hypersomnia with sleep apnea - Ambulatory referral to Sleep Studies  12. Tinea pedis of left foot - nystatin cream (MYCOSTATIN); Apply 1 application topically 2 (two) times daily.  Dispense: 30 g; Refill:   13. Need for prophylactic vaccination against Streptococcus pneumoniae (pneumococcus) -  Pneumococcal conjugate vaccine 13-valent IM   Discussed med's effects and SE's. Screening labs and tests as requested with regular follow-up as recommended. Follow up 1 month to check HR, BP  HPI  63 y.o. male  presents for a complete physical.  He has had elevated blood pressure predates 1998. His blood pressure has been controlled at home, he checks it regularly and it runs 120's/70's, he is on norvasc 5mg  BID, atenolol 100mg , cozaar 100mg , today their BP is BP: 140/84 mmHg. He was taken off HCTZ in the past due to ARF. His EKG shows sinus brady, denies dizziness, has some fatigue, he is on atenolol 100mg  daily, he does have crowded mouth, has some sx's of sleep apnea.  He does workout. He denies chest pain, shortness of breath, dizziness.  He is on cholesterol medication fenofibrate and denies myalgias. His cholesterol is not at goal of 70 or less. The cholesterol last visit was:   Lab Results  Component Value Date   CHOL 171 02/22/2014   HDL 42 02/22/2014   LDLCALC 100* 02/22/2014   TRIG 147 02/22/2014   CHOLHDL 4.1 02/22/2014   He has had diabetes since 2013. He has been working on diet and exercise for diabetes with CKD stage II from DM, he is on metformin, he is on bASA, he is on ACE/ARB and denies paresthesia of the feet, polydipsia, polyuria and visual disturbances. Last A1C in the office was:  Lab Results  Component Value Date   HGBA1C 6.6* 02/22/2014   Patient is on Vitamin D supplement.   Lab Results  Component Value Date   VD25OH 45 02/22/2014     He is on thyroid medication. His medication was not changed last visit. Patient  denies fatigue, weight  changes, heat/cold intolerance, bowel/skin changes or CVS symptoms  Lab Results  Component Value Date   TSH 1.883 02/22/2014   BMI is Body mass index is 37.02 kg/(m^2)., he is working on diet and exercise. Wt Readings from Last 3 Encounters:  05/01/14 258 lb (117.028 kg)  02/22/14 255 lb (115.667 kg)  11/01/13 259 lb  (117.482 kg)     Current Medications:  Current Outpatient Prescriptions on File Prior to Visit  Medication Sig Dispense Refill  . amLODipine (NORVASC) 5 MG tablet Take 1 tablet (5 mg total) by mouth 2 (two) times daily. 60 tablet 3  . aspirin 81 MG tablet Take 81 mg by mouth daily.    [name] atenolol (TENORMIN) 100 MG tablet Take 1 tablet (100 mg total) by mouth daily. 90 tablet 3  . cholecalciferol (VITAMIN D) 1000 UNITS tablet Take 1,000 Units by mouth daily.    . fenofibrate micronized (LOFIBRA) 134 MG capsule take 1 capsule by mouth once daily 30 capsule 3  . HYDROcodone-acetaminophen (NORCO/VICODIN) 5-325 MG per tablet Take 1 tablet by mouth every 6 (six) hours as needed for moderate pain. 90 tablet 0  . levothyroxine (SYNTHROID, LEVOTHROID) 50 MCG tablet take 1 tablet by mouth once daily 30 tablet 3  . losartan (COZAAR) 100 MG tablet take 1 tablet by mouth once daily 90 tablet 3  . Magnesium 250 MG TABS Take 500 mg by mouth.     . metFORMIN (GLUCOPHAGE) 500 MG tablet take 1 tablet by mouth twice a day with food 60 tablet 3  . Omega-3 Fatty Acids (FISH OIL PO) Take 1,200 mg by mouth daily.     No current facility-administered medications on file prior to visit.   Health Maintenance:   Immunization History  Administered Date(s) Administered  . Influenza Split 04/05/2014  . Influenza-Unspecified 02/02/2013  . Td 04/25/2010  . Varicella Zoster Immune Globulin 02/22/2014   Tetanus: 2011 Pneumovax: will need 1 year Prevnar 2015 Flu vaccine: 2015 Zostavax: 2015 Colonoscopy: 2009 Dr. Carlean Purl due this year, will get after 1st of year EGD: CXR 2009 Last Dental Exam:None Last Eye Exam: [facility] 04/2014 last DEE  Patient Care Team: [name], MD as PCP - General (Internal Medicine) Gatha Mayer, MD as Consulting Physician (Gastroenterology)  Allergies: No Known Allergies Medical History:  Past Medical History  Diagnosis Date  . Hypertension   . Hyperlipidemia   .  Diabetes mellitus without complication   . Thyroid disease   . Vitamin D deficiency   . Allergy   . Arthritis     lumbar   Surgical History:  Past Surgical History  Procedure Laterality Date  . Joint replacement Bilateral 2001    Hips   Family History:  Family History  Problem Relation Age of Onset  . Cancer Mother 55    Lung  . Cancer Father 54    lung  . Cancer Sister 36    colon   Social History:  History  Substance Use Topics  . Smoking status: Never Smoker   . Smokeless tobacco: Never Used  . Alcohol Use: No   Review of Systems: Review of Systems  Constitutional: Positive for malaise/fatigue. Negative for fever, chills, weight loss and diaphoresis.  HENT: Negative.   Eyes: Negative.   Respiratory: Negative.   Cardiovascular: Positive for leg swelling. Negative for chest pain, palpitations, orthopnea, claudication and PND.  Gastrointestinal: Negative.   Genitourinary: Negative for dysuria, urgency, frequency, hematuria and flank pain.  Nocturia 1-2  Musculoskeletal: Positive for joint pain (right shoulder worse with picking up something heavy, worse with abduction). Negative for myalgias, back pain, falls and neck pain.  Skin: Negative.   Neurological: Negative.  Negative for weakness.  Endo/Heme/Allergies: Negative.   Psychiatric/Behavioral: Negative.     Physical Exam: Estimated body mass index is 37.02 kg/(m^2) as calculated from the following:   Height as of this encounter: 5\' 10"  (1.778 m).   Weight as of this encounter: 258 lb (117.028 kg). BP 140/84 mmHg  Pulse 56  Temp(Src) 98.5 F (36.9 C)  Resp 16  Ht 5\' 10"  (1.778 m)  Wt 258 lb (117.028 kg)  BMI 37.02 kg/m2 General Appearance: Well nourished, in no apparent distress.  Eyes: PERRLA, EOMs, conjunctiva no swelling or erythema, normal fundi and vessels.  Sinuses: No Frontal/maxillary tenderness  ENT/Mouth: Ext aud canals clear, normal light reflex with TMs without erythema, bulging. Good  dentition. No erythema, swelling, or exudate on post pharynx. Tonsils not swollen or erythematous. Hearing normal. Crowded mouth. Neck: Supple, thyroid normal. No bruits  Respiratory: Respiratory effort normal, BS equal bilaterally without rales, rhonchi, wheezing or stridor.  Cardio: RRR without murmurs, rubs or gallops. Brisk peripheral pulses with 1+ edema.  Chest: symmetric, with normal excursions and percussion.  Abdomen: Soft, nontender, obese + ventral hernia no guarding, rebound,  masses, or organomegaly. .  Lymphatics: Non tender without lymphadenopathy.  Genitourinary: defer Musculoskeletal: Full ROM all peripheral extremities,5/5 strength, and normal gait. Rightcrepitus with ROM and decreased abduction to 120 due to pain  Strength is normal and symmetric in arms. Skin: Warm, dry without rashes, lesions, ecchymosis. See detailed foot exam Neuro: Cranial nerves intact, reflexes equal bilaterally. Normal muscle tone, no cerebellar symptoms. Sensation intact.  Psych: Awake and oriented X 3, normal affect, Insight and Judgment appropriate.   EKG: Sinus brady, t wave inversion V1, no changes from previous EKGs.. AORTA SCAN: WNL    Vicie Mutters 2:19 PM [facility]

## 2014-05-01 NOTE — Patient Instructions (Addendum)
Will get sleep apnea link Please cut atenolol in half due to slow heart rate, and will stop cozaar and swtich to Diovan 320 Monitor BP, call if above 140/90 Put fungus cream on foot/toes twice daily  Preventative Care for Adults, Male       REGULAR HEALTH EXAMS:  A routine yearly physical is a good way to check in with your primary care provider about your health and preventive screening. It is also an opportunity to share updates about your health and any concerns you have, and receive a thorough all-over exam.   Most health insurance companies pay for at least some preventative services.  Check with your health plan for specific coverages.  WHAT PREVENTATIVE SERVICES DO MEN NEED?  Adult men should have their weight and blood pressure checked regularly.   Men age 49 and older should have their cholesterol levels checked regularly.  Beginning at age 50 and continuing to age 6, men should be screened for colorectal cancer.  Certain people should may need continued testing until age 47.  Other cancer screening may include exams for testicular and prostate cancer.  Updating vaccinations is part of preventative care.  Vaccinations help protect against diseases such as the flu.  Lab tests are generally done as part of preventative care to screen for anemia and blood disorders, to screen for problems with the kidneys and liver, to screen for bladder problems, to check blood sugar, and to check your cholesterol level.  Preventative services generally include counseling about diet, exercise, avoiding tobacco, drugs, excessive alcohol consumption, and sexually transmitted infections.    GENERAL RECOMMENDATIONS FOR GOOD HEALTH:  Healthy diet:  Eat a variety of foods, including fruit, vegetables, animal or vegetable protein, such as meat, fish, chicken, and eggs, or beans, lentils, tofu, and grains, such as rice.  Drink plenty of water daily.  Decrease saturated fat in the diet, avoid lots  of red meat, processed foods, sweets, fast foods, and fried foods.  Exercise:  Aerobic exercise helps maintain good heart health. At least 30-40 minutes of moderate-intensity exercise is recommended. For example, a brisk walk that increases your heart rate and breathing. This should be done on most days of the week.   Find a type of exercise or a variety of exercises that you enjoy so that it becomes a part of your daily life.  Examples are running, walking, swimming, water aerobics, and biking.  For motivation and support, explore group exercise such as aerobic class, spin class, Zumba, Yoga,or  martial arts, etc.    Set exercise goals for yourself, such as a certain weight goal, walk or run in a race such as a 5k walk/run.  Speak to your primary care provider about exercise goals.  Disease prevention:  If you smoke or chew tobacco, find out from your caregiver how to quit. It can literally save your life, no matter how long you have been a tobacco user. If you do not use tobacco, never begin.   Maintain a healthy diet and normal weight. Increased weight leads to problems with blood pressure and diabetes.   The Body Mass Index or BMI is a way of measuring how much of your body is fat. Having a BMI above 27 increases the risk of heart disease, diabetes, hypertension, stroke and other problems related to obesity. Your caregiver can help determine your BMI and based on it develop an exercise and dietary program to help you achieve or maintain this important measurement at a healthful level.  High blood pressure causes heart and blood vessel problems.  Persistent high blood pressure should be treated with medicine if weight loss and exercise do not work.   Fat and cholesterol leaves deposits in your arteries that can block them. This causes heart disease and vessel disease elsewhere in your body.  If your cholesterol is found to be high, or if you have heart disease or certain other medical  conditions, then you may need to have your cholesterol monitored frequently and be treated with medication.   Ask if you should have a stress test if your history suggests this. A stress test is a test done on a treadmill that looks for heart disease. This test can find disease prior to there being a problem.  Avoid drinking alcohol in excess (more than two drinks per day).  Avoid use of street drugs. Do not share needles with anyone. Ask for professional help if you need assistance or instructions on stopping the use of alcohol, cigarettes, and/or drugs.  Brush your teeth twice a day with fluoride toothpaste, and floss once a day. Good oral hygiene prevents tooth decay and gum disease. The problems can be painful, unattractive, and can cause other health problems. Visit your dentist for a routine oral and dental check up and preventive care every 6-12 months.   Look at your skin regularly.  Use a mirror to look at your back. Notify your caregivers of changes in moles, especially if there are changes in shapes, colors, a size larger than a pencil eraser, an irregular border, or development of new moles.  Safety:  Use seatbelts 100% of the time, whether driving or as a passenger.  Use safety devices such as hearing protection if you work in environments with loud noise or significant background noise.  Use safety glasses when doing any work that could send debris in to the eyes.  Use a helmet if you ride a bike or motorcycle.  Use appropriate safety gear for contact sports.  Talk to your caregiver about gun safety.  Use sunscreen with a SPF (or skin protection factor) of 15 or greater.  Lighter skinned people are at a greater risk of skin cancer. Don't forget to also wear sunglasses in order to protect your eyes from too much damaging sunlight. Damaging sunlight can accelerate cataract formation.   Practice safe sex. Use condoms. Condoms are used for birth control and to help reduce the spread of  sexually transmitted infections (or STIs).  Some of the STIs are gonorrhea (the clap), chlamydia, syphilis, trichomonas, herpes, HPV (human papilloma virus) and HIV (human immunodeficiency virus) which causes AIDS. The herpes, HIV and HPV are viral illnesses that have no cure. These can result in disability, cancer and death.   Keep carbon monoxide and smoke detectors in your home functioning at all times. Change the batteries every 6 months or use a model that plugs into the wall.   Vaccinations:  Stay up to date with your tetanus shots and other required immunizations. You should have a booster for tetanus every 10 years. Be sure to get your flu shot every year, since 5%-20% of the [place] population comes down with the flu. The flu vaccine changes each year, so being vaccinated once is not enough. Get your shot in the fall, before the flu season peaks.   Other vaccines to consider:  Pneumococcal vaccine to protect against certain types of pneumonia.  This is normally recommended for adults age 51 or older.  However, adults younger  than 64 years old with certain underlying conditions such as diabetes, heart or lung disease should also receive the vaccine.  Shingles vaccine to protect against Varicella Zoster if you are older than age 15, or younger than 64 years old with certain underlying illness.  Hepatitis A vaccine to protect against a form of infection of the liver by a virus acquired from food.  Hepatitis B vaccine to protect against a form of infection of the liver by a virus acquired from blood or body fluids, particularly if you work in health care.  If you plan to travel internationally, check with your local health department for specific vaccination recommendations.  Cancer Screening:  Most routine colon cancer screening begins at the age of 45. On a yearly basis, doctors may provide special easy to use take-home tests to check for hidden blood in the stool. Sigmoidoscopy or  colonoscopy can detect the earliest forms of colon cancer and is life saving. These tests use a small camera at the end of a tube to directly examine the colon. Speak to your caregiver about this at age 68, when routine screening begins (and is repeated every 5 years unless early forms of pre-cancerous polyps or small growths are found).   At the age of 12 men usually start screening for prostate cancer every year. Screening may begin at a younger age for those with higher risk. Those at higher risk include African-Americans or having a family history of prostate cancer. There are two types of tests for prostate cancer:   Prostate-specific antigen (PSA) testing. Recent studies raise questions about prostate cancer using PSA and you should discuss this with your caregiver.   Digital rectal exam (in which your doctor's lubricated and gloved finger feels for enlargement of the prostate through the anus).   Screening for testicular cancer.  Do a monthly exam of your testicles. Gently roll each testicle between your thumb and fingers, feeling for any abnormal lumps. The best time to do this is after a hot shower or bath when the tissues are looser. Notify your caregivers of any lumps, tenderness or changes in size or shape immediately.   Recommendations For Diabetic/Prediabetic Patients:   -  Take medications as prescribed  -  Recommend Dr Fara Olden Fuhrman's book "The End of Diabetes "  And "The End of Dieting"- Can get at  www.[employer].com and encourage also get the Audio CD book  - AVOID Animal products, ie. Meat - red/white, Poultry and Dairy/especially cheese - Exercise at least 5 times a week for 30 minutes or preferably daily.  - No Smoking - Drink less than 2 drinks a day.  - Monitor your feet for sores - Have yearly Eye Exams - Recommend annual Flu vaccine  - Recommend Pneumovax and Prevnar vaccines - Shingles Vaccine (Zostavax) if over 45 y.o.  Goals:   - BMI less than 24 - Fasting sugar  less than 130 or less than 150 if tapering medicines to lose weight  - Systolic BP less than 675  - Diastolic BP less than 80 - Bad LDL Cholesterol less than 70 - Triglycerides less than 150

## 2014-05-02 LAB — HEPATIC FUNCTION PANEL
ALK PHOS: 65 U/L (ref 39–117)
ALT: 19 U/L (ref 0–53)
AST: 19 U/L (ref 0–37)
Albumin: 4.3 g/dL (ref 3.5–5.2)
BILIRUBIN DIRECT: 0.1 mg/dL (ref 0.0–0.3)
BILIRUBIN INDIRECT: 0.5 mg/dL (ref 0.2–1.2)
BILIRUBIN TOTAL: 0.6 mg/dL (ref 0.2–1.2)
Total Protein: 7.2 g/dL (ref 6.0–8.3)

## 2014-05-02 LAB — CBC WITH DIFFERENTIAL/PLATELET
BASOS ABS: 0 10*3/uL (ref 0.0–0.1)
Basophils Relative: 0 % (ref 0–1)
EOS PCT: 2 % (ref 0–5)
Eosinophils Absolute: 0.2 10*3/uL (ref 0.0–0.7)
HCT: 45.4 % (ref 39.0–52.0)
HEMOGLOBIN: 15.5 g/dL (ref 13.0–17.0)
LYMPHS ABS: 2.1 10*3/uL (ref 0.7–4.0)
LYMPHS PCT: 26 % (ref 12–46)
MCH: 33 pg (ref 26.0–34.0)
MCHC: 34.1 g/dL (ref 30.0–36.0)
MCV: 96.8 fL (ref 78.0–100.0)
MPV: 10.5 fL (ref 9.4–12.4)
Monocytes Absolute: 0.7 10*3/uL (ref 0.1–1.0)
Monocytes Relative: 9 % (ref 3–12)
Neutro Abs: 5 10*3/uL (ref 1.7–7.7)
Neutrophils Relative %: 63 % (ref 43–77)
Platelets: 270 10*3/uL (ref 150–400)
RBC: 4.69 MIL/uL (ref 4.22–5.81)
RDW: 14.1 % (ref 11.5–15.5)
WBC: 7.9 10*3/uL (ref 4.0–10.5)

## 2014-05-02 LAB — LIPID PANEL
CHOL/HDL RATIO: 3.6 ratio
CHOLESTEROL: 193 mg/dL (ref 0–200)
HDL: 54 mg/dL (ref >39)
LDL Cholesterol: 107 mg/dL — ABNORMAL HIGH (ref 0–99)
TRIGLYCERIDES: 162 mg/dL — AB (ref <150)
VLDL: 32 mg/dL (ref 0–40)

## 2014-05-02 LAB — BASIC METABOLIC PANEL WITH GFR
BUN: 20 mg/dL (ref 6–23)
CHLORIDE: 102 meq/L (ref 96–112)
CO2: 25 mEq/L (ref 19–32)
Calcium: 9.7 mg/dL (ref 8.4–10.5)
Creat: 0.95 mg/dL (ref 0.50–1.35)
GFR, EST NON AFRICAN AMERICAN: 84 mL/min
GFR, Est African American: >89 mL/min
Glucose, Bld: 99 mg/dL (ref 70–99)
POTASSIUM: 4.1 meq/L (ref 3.5–5.3)
Sodium: 140 mEq/L (ref 135–145)

## 2014-05-02 LAB — URINALYSIS, ROUTINE W REFLEX MICROSCOPIC
Bilirubin Urine: NEGATIVE
GLUCOSE, UA: NEGATIVE mg/dL
Hgb urine dipstick: NEGATIVE
Ketones, ur: NEGATIVE mg/dL
LEUKOCYTES UA: NEGATIVE
Nitrite: NEGATIVE
PH: 6 (ref 5.0–8.0)
Protein, ur: NEGATIVE mg/dL
SPECIFIC GRAVITY, URINE: 1.017 (ref 1.005–1.030)
Urobilinogen, UA: 0.2 mg/dL (ref 0.0–1.0)

## 2014-05-02 LAB — IRON AND TIBC
%SAT: 27 % (ref 20–55)
Iron: 114 ug/dL (ref 42–165)
TIBC: 415 ug/dL (ref 215–435)
UIBC: 301 ug/dL (ref 125–400)

## 2014-05-02 LAB — HEMOGLOBIN A1C
Hgb A1c MFr Bld: 6.6 % — ABNORMAL HIGH (ref <5.7)
Mean Plasma Glucose: 143 mg/dL — ABNORMAL HIGH (ref <117)

## 2014-05-02 LAB — VITAMIN D 25 HYDROXY (VIT D DEFICIENCY, FRACTURES): Vit D, 25-Hydroxy: 36 ng/mL (ref 30–100)

## 2014-05-02 LAB — FERRITIN: FERRITIN: 119 ng/mL (ref 22–322)

## 2014-05-02 LAB — PSA: PSA: 0.37 ng/mL (ref <=4.00)

## 2014-05-02 LAB — TSH: TSH: 3.331 u[IU]/mL (ref 0.350–4.500)

## 2014-05-02 LAB — MICROALBUMIN / CREATININE URINE RATIO
CREATININE, URINE: 170.7 mg/dL
MICROALB UR: 2 mg/dL (ref <2.0)
Microalb Creat Ratio: 11.7 mg/g (ref 0.0–30.0)

## 2014-05-02 LAB — TESTOSTERONE: TESTOSTERONE: 240 ng/dL — AB (ref 300–890)

## 2014-05-02 LAB — INSULIN, FASTING: Insulin fasting, serum: 9.5 u[IU]/mL (ref 2.0–19.6)

## 2014-05-02 LAB — MAGNESIUM: Magnesium: 1.9 mg/dL (ref 1.5–2.5)

## 2014-05-02 LAB — VITAMIN B12: VITAMIN B 12: 1579 pg/mL — AB (ref 211–911)

## 2014-05-13 ENCOUNTER — Other Ambulatory Visit: Payer: Self-pay | Admitting: Internal Medicine

## 2014-05-21 ENCOUNTER — Other Ambulatory Visit: Payer: Self-pay | Admitting: Internal Medicine

## 2014-05-26 ENCOUNTER — Other Ambulatory Visit: Payer: Self-pay | Admitting: Internal Medicine

## 2014-06-07 ENCOUNTER — Encounter: Payer: Self-pay | Admitting: Physician Assistant

## 2014-06-07 ENCOUNTER — Ambulatory Visit (INDEPENDENT_AMBULATORY_CARE_PROVIDER_SITE_OTHER): Payer: BLUE CROSS/BLUE SHIELD | Admitting: Physician Assistant

## 2014-06-07 VITALS — BP 168/82 | HR 64 | Temp 98.2°F | Resp 16 | Ht 70.0 in | Wt 271.0 lb

## 2014-06-07 DIAGNOSIS — I10 Essential (primary) hypertension: Secondary | ICD-10-CM

## 2014-06-07 DIAGNOSIS — G4710 Hypersomnia, unspecified: Secondary | ICD-10-CM

## 2014-06-07 DIAGNOSIS — R001 Bradycardia, unspecified: Secondary | ICD-10-CM

## 2014-06-07 DIAGNOSIS — G4730 Sleep apnea, unspecified: Secondary | ICD-10-CM

## 2014-06-07 NOTE — Progress Notes (Signed)
Assessment and Plan: HTN- will switch Diovan to 1/2 pill BID, continue others the day, continue weight loss.  ? Sleep apnea- getting apnea link- has not had call yet, will give phone number.   HPI 64 y.o.male presents for 1 month follow up. He has CPE on 05/01/2014 and his HR was in the 50's with fatigue so his atetolol was cut in half and his cozaar was switched to diovan 320 he is suppose to be taking 1 pill daily but has been taking 1/2 pill and Norvasc 5mg  BID, Atenolol 100mg  1/2 pill. BP has been elevated in 160's but he states HR has been better.   Past Medical History  Diagnosis Date  . Hypertension   . Hyperlipidemia   . Diabetes mellitus without complication   . Thyroid disease   . Vitamin D deficiency   . Allergy   . Arthritis     lumbar     No Known Allergies    Current Outpatient Prescriptions on File Prior to Visit  Medication Sig Dispense Refill  . amLODipine (NORVASC) 5 MG tablet take 1 tablet by mouth twice a day 60 tablet 3  . aspirin 81 MG tablet Take 81 mg by mouth daily.    [name] atenolol (TENORMIN) 100 MG tablet Take 1 tablet (100 mg total) by mouth daily. 90 tablet 3  . cholecalciferol (VITAMIN D) 1000 UNITS tablet Take 1,000 Units by mouth daily.    . fenofibrate micronized (LOFIBRA) 134 MG capsule take 1 capsule by mouth once daily 30 capsule 3  . HYDROcodone-acetaminophen (NORCO/VICODIN) 5-325 MG per tablet Take 1 tablet by mouth every 6 (six) hours as needed for moderate pain. 90 tablet 0  . levothyroxine (SYNTHROID, LEVOTHROID) 50 MCG tablet take 1 tablet by mouth once daily 90 tablet 1  . Magnesium 250 MG TABS Take 500 mg by mouth.     . metFORMIN (GLUCOPHAGE) 500 MG tablet take 1 tablet by mouth twice a day with food 60 tablet 3  . nystatin cream (MYCOSTATIN) Apply 1 application topically 2 (two) times daily. 30 g 1  . Omega-3 Fatty Acids (FISH OIL PO) Take 1,200 mg by mouth daily.    . valsartan (DIOVAN) 320 MG tablet Take 1 tablet (320 mg total) by mouth  daily. 30 tablet 3   No current facility-administered medications on file prior to visit.    ROS: all negative except above.   Physical Exam: Filed Weights   06/07/14 1628  Weight: 271 lb (122.925 kg)   BP 168/82 mmHg  Pulse 64  Temp(Src) 98.2 F (36.8 C)  Resp 16  Wt 271 lb (122.925 kg) General Appearance: Well nourished, in no apparent distress. Eyes: PERRLA, EOMs, conjunctiva no swelling or erythema Sinuses: No Frontal/maxillary tenderness ENT/Mouth: Ext aud canals clear, TMs without erythema, bulging. No erythema, swelling, or exudate on post pharynx.  Tonsils not swollen or erythematous. Hearing normal.  Neck: Supple, thyroid normal.  Respiratory: Respiratory effort normal, BS equal bilaterally without rales, rhonchi, wheezing or stridor.  Cardio: RRR with no MRGs. Brisk peripheral pulses without edema.  Abdomen: Soft, + BS.  Non tender, no guarding, rebound, hernias, masses. Lymphatics: Non tender without lymphadenopathy.  Musculoskeletal: Full ROM, 5/5 strength, normal gait.  Skin: Warm, dry without rashes, lesions, ecchymosis.  Neuro: Cranial nerves intact. Normal muscle tone, no cerebellar symptoms. Sensation intact.  Psych: Awake and oriented X 3, normal affect, Insight and Judgment appropriate.     Vicie Mutters, PA-C 4:32 PM [facility]

## 2014-06-07 NOTE — Patient Instructions (Signed)
Diovan break in half and do 1/2 in the morning and the other half at night  Continue the other medications the same.   Call the office if your blood pressure is above 140/90 consistently.  Call [facility] sleep   DASH Eating Plan DASH stands for "Dietary Approaches to Stop Hypertension." The DASH eating plan is a healthy eating plan that has been shown to reduce high blood pressure (hypertension). Additional health benefits may include reducing the risk of type 2 diabetes mellitus, heart disease, and stroke. The DASH eating plan may also help with weight loss. WHAT DO I NEED TO KNOW ABOUT THE DASH EATING PLAN? For the DASH eating plan, you will follow these general guidelines:  Choose foods with a percent daily value for sodium of less than 5% (as listed on the food label).  Use salt-free seasonings or herbs instead of table salt or sea salt.  Check with your health care provider or pharmacist before using salt substitutes.  Eat lower-sodium products, often labeled as "lower sodium" or "no salt added."  Eat fresh foods.  Eat more vegetables, fruits, and low-fat dairy products.  Choose whole grains. Look for the word "whole" as the first word in the ingredient list.  Choose fish and skinless chicken or [place] more often than red meat. Limit fish, poultry, and meat to 6 oz (170 g) each day.  Limit sweets, desserts, sugars, and sugary drinks.  Choose heart-healthy fats.  Limit cheese to 1 oz (28 g) per day.  Eat more home-cooked food and less restaurant, buffet, and fast food.  Limit fried foods.  Cook foods using methods other than frying.  Limit canned vegetables. If you do use them, rinse them well to decrease the sodium.  When eating at a restaurant, ask that your food be prepared with less salt, or no salt if possible. WHAT FOODS CAN I EAT? Seek help from a dietitian for individual calorie needs. Grains Whole grain or whole wheat bread. Brown rice. Whole grain or whole  wheat pasta. Quinoa, bulgur, and whole grain cereals. Low-sodium cereals. Corn or whole wheat flour tortillas. Whole grain cornbread. Whole grain crackers. Low-sodium crackers. Vegetables Fresh or frozen vegetables (raw, steamed, roasted, or grilled). Low-sodium or reduced-sodium tomato and vegetable juices. Low-sodium or reduced-sodium tomato sauce and paste. Low-sodium or reduced-sodium canned vegetables.  Fruits All fresh, canned (in natural juice), or frozen fruits. Meat and Other Protein Products Ground beef (85% or leaner), grass-fed beef, or beef trimmed of fat. Skinless chicken or [place]. Ground chicken or [place]. Pork trimmed of fat. All fish and seafood. Eggs. Dried beans, peas, or lentils. Unsalted nuts and seeds. Unsalted canned beans. Dairy Low-fat dairy products, such as skim or 1% milk, 2% or reduced-fat cheeses, low-fat ricotta or cottage cheese, or plain low-fat yogurt. Low-sodium or reduced-sodium cheeses. Fats and Oils Tub margarines without trans fats. Light or reduced-fat mayonnaise and salad dressings (reduced sodium). Avocado. Safflower, olive, or canola oils. Natural peanut or almond butter. Other Unsalted popcorn and pretzels. The items listed above may not be a complete list of recommended foods or beverages. Contact your dietitian for more options. WHAT FOODS ARE NOT RECOMMENDED? Grains White bread. White pasta. White rice. Refined cornbread. Bagels and croissants. Crackers that contain trans fat. Vegetables Creamed or fried vegetables. Vegetables in a cheese sauce. Regular canned vegetables. Regular canned tomato sauce and paste. Regular tomato and vegetable juices. Fruits Dried fruits. Canned fruit in light or heavy syrup. Fruit juice. Meat and Other Protein Products Fatty  cuts of meat. Ribs, chicken wings, bacon, sausage, bologna, salami, chitterlings, fatback, hot dogs, bratwurst, and packaged luncheon meats. Salted nuts and seeds. Canned beans with  salt. Dairy Whole or 2% milk, cream, half-and-half, and cream cheese. Whole-fat or sweetened yogurt. Full-fat cheeses or blue cheese. Nondairy creamers and whipped toppings. Processed cheese, cheese spreads, or cheese curds. Condiments Onion and garlic salt, seasoned salt, table salt, and sea salt. Canned and packaged gravies. Worcestershire sauce. Tartar sauce. Barbecue sauce. Teriyaki sauce. Soy sauce, including reduced sodium. Steak sauce. Fish sauce. Oyster sauce. Cocktail sauce. Horseradish. Ketchup and mustard. Meat flavorings and tenderizers. Bouillon cubes. Hot sauce. Tabasco sauce. Marinades. Taco seasonings. Relishes. Fats and Oils Butter, stick margarine, lard, shortening, ghee, and bacon fat. Coconut, palm kernel, or palm oils. Regular salad dressings. Other Pickles and olives. Salted popcorn and pretzels. The items listed above may not be a complete list of foods and beverages to avoid. Contact your dietitian for more information. WHERE CAN I FIND MORE INFORMATION? National [employer], [employer], [employer]: travelstabl[employer].com Document Released: 04/30/2011 Document Revised: 09/25/2013 Document Reviewed: 03/15/2013 [facility] Patient Information 2015 [place], [place]. This information is not intended to replace advice given to you by your health care provider. Make sure you discuss any questions you have with your health care provider.  Sleep Apnea  Sleep apnea is a sleep disorder characterized by abnormal pauses in breathing while you sleep. When your breathing pauses, the level of oxygen in your blood decreases. This causes you to move out of deep sleep and into light sleep. As a result, your quality of sleep is poor, and the system that carries your blood throughout your body (cardiovascular system) experiences stress. If sleep apnea remains untreated, the following conditions can develop:  High blood pressure (hypertension).  Coronary artery  disease.  Inability to achieve or maintain an erection (impotence).  Impairment of your thought process (cognitive dysfunction). There are three types of sleep apnea: 1. Obstructive sleep apnea--Pauses in breathing during sleep because of a blocked airway. 2. Central sleep apnea--Pauses in breathing during sleep because the area of the brain that controls your breathing does not send the correct signals to the muscles that control breathing. 3. Mixed sleep apnea--A combination of both obstructive and central sleep apnea. RISK FACTORS The following risk factors can increase your risk of developing sleep apnea:  Being overweight.  Smoking.  Having narrow passages in your nose and throat.  Being of older age.  Being male.  Alcohol use.  Sedative and tranquilizer use.  Ethnicity. Among individuals younger than 35 years, African Americans are at increased risk of sleep apnea. SYMPTOMS   Difficulty staying asleep.  Daytime sleepiness and fatigue.  Loss of energy.  Irritability.  Loud, heavy snoring.  Morning headaches.  Trouble concentrating.  Forgetfulness.  Decreased interest in sex. DIAGNOSIS  In order to diagnose sleep apnea, your caregiver will perform a physical examination. Your caregiver may suggest that you take a home sleep test. Your caregiver may also recommend that you spend the night in a sleep lab. In the sleep lab, several monitors record information about your heart, lungs, and brain while you sleep. Your leg and arm movements and blood oxygen level are also recorded. TREATMENT The following actions may help to resolve mild sleep apnea:  Sleeping on your side.   Using a decongestant if you have nasal congestion.   Avoiding the use of depressants, including alcohol, sedatives, and narcotics.   Losing weight and modifying your diet if you  are overweight. There also are devices and treatments to help open your airway:  Oral appliances. These are  custom-made mouthpieces that shift your lower jaw forward and slightly open your bite. This opens your airway.  Devices that create positive airway pressure. This positive pressure "splints" your airway open to help you breathe better during sleep. The following devices create positive airway pressure:  Continuous positive airway pressure (CPAP) device. The CPAP device creates a continuous level of air pressure with an air pump. The air is delivered to your airway through a mask while you sleep. This continuous pressure keeps your airway open.  Nasal expiratory positive airway pressure (EPAP) device. The EPAP device creates positive air pressure as you exhale. The device consists of single-use valves, which are inserted into each nostril and held in place by adhesive. The valves create very little resistance when you inhale but create much more resistance when you exhale. That increased resistance creates the positive airway pressure. This positive pressure while you exhale keeps your airway open, making it easier to breath when you inhale again.  Bilevel positive airway pressure (BPAP) device. The BPAP device is used mainly in patients with central sleep apnea. This device is similar to the CPAP device because it also uses an air pump to deliver continuous air pressure through a mask. However, with the BPAP machine, the pressure is set at two different levels. The pressure when you exhale is lower than the pressure when you inhale.  Surgery. Typically, surgery is only done if you cannot comply with less invasive treatments or if the less invasive treatments do not improve your condition. Surgery involves removing excess tissue in your airway to create a wider passage way. Document Released: 05/01/2002 Document Revised: 09/05/2012 Document Reviewed: 09/17/2011 [facility] Patient Information 2015 [place], [place]. This information is not intended to replace advice given to you by your health care provider.  Make sure you discuss any questions you have with your health care provider.

## 2014-06-15 ENCOUNTER — Other Ambulatory Visit: Payer: Self-pay | Admitting: Internal Medicine

## 2014-07-21 ENCOUNTER — Other Ambulatory Visit: Payer: Self-pay | Admitting: Physician Assistant

## 2014-07-25 ENCOUNTER — Other Ambulatory Visit: Payer: Self-pay | Admitting: Physician Assistant

## 2014-07-25 MED ORDER — HYDROCODONE-ACETAMINOPHEN 5-325 MG PO TABS: 1.0000 | ORAL_TABLET | Freq: Four times a day (QID) | ORAL | Status: DC | PRN

## 2014-08-08 ENCOUNTER — Encounter: Payer: Self-pay | Admitting: Physician Assistant

## 2014-08-08 ENCOUNTER — Ambulatory Visit (INDEPENDENT_AMBULATORY_CARE_PROVIDER_SITE_OTHER): Payer: BLUE CROSS/BLUE SHIELD | Admitting: Physician Assistant

## 2014-08-08 VITALS — BP 138/80 | HR 72 | Temp 98.1°F | Resp 16 | Ht 70.0 in | Wt 257.0 lb

## 2014-08-08 DIAGNOSIS — Z79899 Other long term (current) drug therapy: Secondary | ICD-10-CM

## 2014-08-08 DIAGNOSIS — I10 Essential (primary) hypertension: Secondary | ICD-10-CM

## 2014-08-08 DIAGNOSIS — E1122 Type 2 diabetes mellitus with diabetic chronic kidney disease: Secondary | ICD-10-CM

## 2014-08-08 DIAGNOSIS — N182 Chronic kidney disease, stage 2 (mild): Secondary | ICD-10-CM

## 2014-08-08 DIAGNOSIS — E559 Vitamin D deficiency, unspecified: Secondary | ICD-10-CM

## 2014-08-08 DIAGNOSIS — E079 Disorder of thyroid, unspecified: Secondary | ICD-10-CM

## 2014-08-08 DIAGNOSIS — M791 Myalgia, unspecified site: Secondary | ICD-10-CM

## 2014-08-08 DIAGNOSIS — E785 Hyperlipidemia, unspecified: Secondary | ICD-10-CM

## 2014-08-08 DIAGNOSIS — E669 Obesity, unspecified: Secondary | ICD-10-CM

## 2014-08-08 NOTE — Patient Instructions (Addendum)
Diabetes is a very complicated disease...lets simplify it.  An easy way to look at it to understand the complications is if you think of the extra sugar floating in your blood stream as glass shards floating through your blood stream.    Diabetes affects your small vessels first: 1) The glass shards (sugar) scraps down the tiny blood vessels in your eyes and lead to diabetic retinopathy, the leading cause of blindness in the [place]. Diabetes is the leading cause of newly diagnosed adult (20 to 65 years of age) blindness in the [place].  2) The glass shards scratches down the tiny vessels of your legs leading to nerve damage called neuropathy and can lead to amputations of your feet. More than 60% of all non-traumatic amputations of lower limbs occur in people with diabetes.  3) Over time the small vessels in your brain are shredded and closed off, individually this does not cause any problems but over a long period of time many of the small vessels being blocked can lead to Vascular Dementia.   4) Your kidney's are a filter system and have a "net" that keeps certain things in the body and lets bad things out. Sugar shreds this net and leads to kidney damage and eventually failure. Decreasing the sugar that is destroying the net and certain blood pressure medications can help stop or decrease progression of kidney disease. Diabetes was the primary cause of kidney failure in 44 percent of all new cases in 2011.  5) Diabetes also destroys the small vessels in your penis that lead to erectile dysfunction. Eventually the vessels are so damaged that you may not be responsive to cialis or viagra.   Diabetes and your large vessels: Your larger vessels consist of your coronary arteries in your heart and the carotid vessels to your brain. Diabetes or even increased sugars put you at 300% increased risk of heart attack and stroke and this is why.. The sugar scrapes down your large blood vessels and your body  sees this as an internal injury and tries to repair itself. Just like you get a scab on your skin, your platelets will stick to the blood vessel wall trying to heal it. This is why we have diabetics on low dose aspirin daily, this prevents the platelets from sticking and can prevent plaque formation. In addition, your body takes cholesterol and tries to shove it into the open wound. This is why we want your LDL, or bad cholesterol, below 70.   The combination of platelets and cholesterol over 5-10 years forms plaque that can break off and cause a heart attack or stroke.   PLEASE REMEMBER:  Diabetes is preventable! Up to 85 percent of complications and morbidities among individuals with type 2 diabetes can be prevented, delayed, or effectively treated and minimized with regular visits to a health professional, appropriate monitoring and medication, and a healthy diet and lifestyle.  Before you even begin to attack a weight-loss plan, it pays to remember this: You are not fat. You have fat. Losing weight isn't about blame or shame; it's simply another achievement to accomplish. Dieting is like any other skill-you have to buckle down and work at it. As long as you act in a smart, reasonable way, you'll ultimately get where you want to be. Here are some weight loss pearls for you.  1. It's Not a Diet. It's a Lifestyle Thinking of a diet as something you're on and suffering through only for the short term doesn't   work. To shed weight and keep it off, you need to make permanent changes to the way you eat. It's OK to indulge occasionally, of course, but if you cut calories temporarily and then revert to your old way of eating, you'll gain back the weight quicker than you can say yo-yo. Use it to lose it. Research shows that one of the best predictors of long-term weight loss is how many pounds you drop in the first month. For that reason, nutritionists often suggest being stricter for the first two weeks of your  new eating strategy to build momentum. Cut out added sugar and alcohol and avoid unrefined carbs. After that, figure out how you can reincorporate them in a way that's healthy and maintainable.  2. There's a Right Way to Exercise Working out burns calories and fat and boosts your metabolism by building muscle. But those trying to lose weight are notorious for overestimating the number of calories they burn and underestimating the amount they take in. Unfortunately, your system is biologically programmed to hold on to extra pounds and that means when you start exercising, your body senses the deficit and ramps up its hunger signals. If you're not diligent, you'll eat everything you burn and then some. Use it to lose it. Cardio gets all the exercise glory, but strength and interval training are the real heroes. They help you build lean muscle, which in turn increases your metabolism and calorie-burning ability 3. Don't Overreact to Mild Hunger Some people have a hard time losing weight because of hunger anxiety. To them, being hungry is bad-something to be avoided at all costs-so they carry snacks with them and eat when they don't need to. Others eat because they're stressed out or bored. While you never want to get to the point of being ravenous (that's when bingeing is likely to happen), a hunger pang, a craving, or the fact that it's 3:00 p.m. should not send you racing for the vending machine or obsessing about the energy bar in your purse. Ideally, you should put off eating until your stomach is growling and it's difficult to concentrate.  Use it to lose it. When you feel the urge to eat, use the HALT method. Ask yourself, Am I really hungry? Or am I angry or anxious, lonely or bored, or tired? If you're still not certain, try the apple test. If you're truly hungry, an apple should seem delicious; if it doesn't, something else is going on. Or you can try drinking water and making yourself busy, if you are  still hungry try a healthy snack.  4. Not All Calories Are Created Equal The mechanics of weight loss are pretty simple: Take in fewer calories than you use for energy. But the kind of food you eat makes all the difference. Processed food that's high in saturated fat and refined starch or sugar can cause inflammation that disrupts the hormone signals that tell your brain you're full. The result: You eat a lot more.  Use it to lose it. Clean up your diet. Swap in whole, unprocessed foods, including vegetables, lean protein, and healthy fats that will fill you up and give you the biggest nutritional bang for your calorie buck. In a few weeks, as your brain starts receiving regular hunger and fullness signals once again, you'll notice that you feel less hungry overall and naturally start cutting back on the amount you eat.  5. Protein, Produce, and Plant-Based Fats Are Your Weight-Loss Trinity Here's why eating the three   Ps regularly will help you drop pounds. Protein fills you up. You need it to build lean muscle, which keeps your metabolism humming so that you can torch more fat. People in a weight-loss program who ate double the recommended daily allowance for protein (about 110 grams for a 150-pound woman) lost 70 percent of their weight from fat, while people who ate the RDA lost only about 40 percent, one study found. Produce is packed with filling fiber. "It's very difficult to consume too many calories if you're eating a lot of vegetables. Example: Three cups of broccoli is a lot of food, yet only 93 calories. (Fruit is another story. It can be easy to overeat and can contain a lot of calories from sugar, so be sure to monitor your intake.) Plant-based fats like olive oil and those in avocados and nuts are healthy and extra satiating.  Use it to lose it. Aim to incorporate each of the three Ps into every meal and snack. People who eat protein throughout the day are able to keep weight off, according  to a study in the American Journal of Clinical Nutrition. In addition to meat, poultry and seafood, good sources are beans, lentils, eggs, tofu, and yogurt. As for fat, keep portion sizes in check by measuring out salad dressing, oil, and nut butters (shoot for one to two tablespoons). Finally, eat veggies or a little fruit at every meal. People who did that consumed 308 fewer calories but didn't feel any hungrier than when they didn't eat more produce.  7. How You Eat Is As Important As What You Eat In order for your brain to register that you're full, you need to focus on what you're eating. Sit down whenever you eat, preferably at a table. Turn off the TV or computer, put down your phone, and look at your food. Smell it. Chew slowly, and don't put another bite on your fork until you swallow. When women ate lunch this attentively, they consumed 30 percent less when snacking later than those who listened to an audiobook at lunchtime, according to a study in the British Journal of Nutrition. 8. Weighing Yourself Really Works The scale provides the best evidence about whether your efforts are paying off. Seeing the numbers tick up or down or stagnate is motivation to keep going-or to rethink your approach. A 2015 study at [employer] found that daily weigh-ins helped people lose more weight, keep it off, and maintain that loss, even after two years. Use it to lose it. Step on the scale at the same time every day for the best results. If your weight shoots up several pounds from one weigh-in to the next, don't freak out. Eating a lot of salt the night before or having your period is the likely culprit. The number should return to normal in a day or two. It's a steady climb that you need to do something about. 9. Too Much Stress and Too Little Sleep Are Your Enemies When you're tired and frazzled, your body cranks up the production of cortisol, the stress hormone that can cause carb cravings. Not getting  enough sleep also boosts your levels of ghrelin, a hormone associated with hunger, while suppressing leptin, a hormone that signals fullness and satiety. People on a diet who slept only five and a half hours a night for two weeks lost 55 percent less fat and were hungrier than those who slept eight and a half hours, according to a study in the [employer] Medical   Association Journal. Use it to lose it. Prioritize sleep, aiming for seven hours or more a night, which research shows helps lower stress. And make sure you're getting quality zzz's. If a snoring spouse or a fidgety cat wakes you up frequently throughout the night, you may end up getting the equivalent of just four hours of sleep, according to a study from [facility]. Keep pets out of the bedroom, and use a white-noise app to drown out snoring. 10. You Will Hit a plateau-And You Can Bust Through It As you slim down, your body releases much less leptin, the fullness hormone.  If you're not strength training, start right now. Building muscle can raise your metabolism to help you overcome a plateau. To keep your body challenged and burning calories, incorporate new moves and more intense intervals into your workouts or add another sweat session to your weekly routine. Alternatively, cut an extra 100 calories or so a day from your diet. Now that you've lost weight, your body simply doesn't need as much fuel.   Muscle aches Stop the fenofibrate for 1 week and then start back after we get the lab back. We are getting a lab that will look for muscle break down. We will check your potassium, magnesium to see if these are low which can cause muscle aches.  Please make sure you are taking 64 oz of water a day as long as you do now have a heart condition.  -Please take Tylenol or Aleve for pain. -You can take tylenol (500mg ) or tylenol arthritis (650mg ). The max you can take of tylenol a day is 3000mg  daily, this is a max of 6 pills a day of the  regular tyelnol (500mg ) or a max of 4 a day of the tylenol arthritis (650mg ) as long as no other medications you are taking contain tylenol.  -Aleve/Naproxen Sodium- can take 1-2 in the morning and 1 at night. Max 3 a day. Take with food to avoid ulcers. Does affect your kidney function so use sparingly.   Muscle Pain Muscle pain (myalgia) may be caused by many things, including:  Overuse or muscle strain, especially if you are not in shape. This is the most common cause of muscle pain.  Injury.  Bruises.  Viruses, such as the flu.  Infectious diseases.  Fibromyalgia, which is a chronic condition that causes muscle tenderness, fatigue, and headache.  Autoimmune diseases, including lupus.  Certain drugs, including ACE inhibitors and statins. Muscle pain may be mild or severe. In most cases, the pain lasts only a short time and goes away without treatment. To diagnose the cause of your muscle pain, your health care provider will take your medical history. This means he or she will ask you when your muscle pain began and what has been happening. If you have not had muscle pain for very long, your health care provider may want to wait before doing much testing. If your muscle pain has lasted a long time, your health care provider may want to run tests right away. If your health care provider thinks your muscle pain may be caused by illness, you may need to have additional tests to rule out certain conditions.  Treatment for muscle pain depends on the cause. Home care is often enough to relieve muscle pain. Your health care provider may also prescribe anti-inflammatory medicine. HOME CARE INSTRUCTIONS Watch your condition for any changes. The following actions may help to lessen any discomfort you are feeling:  Only take  over-the-counter or prescription medicines as directed by your health care provider.  Apply ice to the sore muscle:  Put ice in a plastic bag.  Place a towel between your  skin and the bag.  Leave the ice on for 15-20 minutes, 3-4 times a day.  You may alternate applying hot and cold packs to the muscle as directed by your health care provider.  If overuse is causing your muscle pain, slow down your activities until the pain goes away.  Remember that it is normal to feel some muscle pain after starting a workout program. Muscles that have not been used often will be sore at first.  Do regular, gentle exercises if you are not usually active.  Warm up before exercising to lower your risk of muscle pain.  Do not continue working out if the pain is very bad. Bad pain could mean you have injured a muscle. SEEK MEDICAL CARE IF:  Your muscle pain gets worse, and medicines do not help.  You have muscle pain that lasts longer than 3 days.  You have a rash or fever along with muscle pain.  You have muscle pain after a tick bite.  You have muscle pain while working out, even though you are in good physical condition.  You have redness, soreness, or swelling along with muscle pain.  You have muscle pain after starting a new medicine or changing the dose of a medicine. SEEK IMMEDIATE MEDICAL CARE IF:  You have trouble breathing.  You have trouble swallowing.  You have muscle pain along with a stiff neck, fever, and vomiting.  You have severe muscle weakness or cannot move part of your body. MAKE SURE YOU:   Understand these instructions.  Will watch your condition.  Will get help right away if you are not doing well or get worse. Document Released: 04/02/2006 Document Revised: 05/16/2013 Document Reviewed: 03/07/2013 [facility] Patient Information 2015 [place], [place]. This information is not intended to replace advice given to you by your health care provider. Make sure you discuss any questions you have with your health care provider.

## 2014-08-08 NOTE — Progress Notes (Addendum)
Assessment and Plan:  1. Hypertension -Continue medication, monitor blood pressure at home. Continue DASH diet.  Reminder to go to the ER if any CP, SOB, nausea, dizziness, severe HA, changes vision/speech, left arm numbness and tingling and jaw pain.  2. Cholesterol -Continue diet and exercise. Check cholesterol.   3. Diabetes with diabetic chronic kidney disease -Continue diet and exercise. Check A1C  4. Vitamin D Def - check level and continue medications.   5. Hypothyroidism -check TSH level, continue medications the same, reminded to take on an empty stomach 30-31mns before food.   6. Obesity with co morbidities - long discussion about weight loss, diet, and exercise  7. Myalgias- stop fenofibrate, take NSAIDS PRN, increase fluids, will check CPK,  TSH, ESR, magnesium, potassium, denies recent tick exposure  Addendum: CPK very elevated-will recheck 1 week with kidney function. Stay OFF fenofibrate and try to rest the day before labs.  Kidney function is worse. Please increase water A LOT! , decrease NSAIDS like aleve, ibuprofen, etc.   Uric acid is high, will add allopurinol 300 mg 1/2 pill daily and send in colchicine and recheck with labs 1 week.    if his symptoms continue to worsen (pain, fever, very dark/brown urine, urinary symptoms, AB pain, etc), or if he is unable to take anything by mouth (pills, fluids, etc), instructed to go to the ER for further evaluation and treatment.   Continue diet and meds as discussed. Further disposition pending results of labs. Discussed med's effects and SE's.    HPI 64y.o. male  presents for 3 month follow up  has a past medical history of Hypertension; Hyperlipidemia; Diabetes mellitus without complication; Thyroid disease; Vitamin D deficiency; Allergy; and Arthritis.  His blood pressure has been controlled at home, today his BP is BP: 138/80 mmHg.  He does workout, he is very active at work. He denies chest pain, shortness of  breath, dizziness. He has been working a lot, climbing more and bending more, he has been having increasing cramping in his legs at night due to this for the last 2 weeks.   He is on cholesterol medication, fenofibrate and had myalgias. His cholesterol is not at goal. The cholesterol was:   Lab Results  Component Value Date   CHOL 193 05/01/2014   HDL 54 05/01/2014   LDLCALC 107* 05/01/2014   TRIG 162* 05/01/2014   CHOLHDL 3.6 05/01/2014   He has been working on diet and exercise for diabetes with diabetic chronic kidney disease, he is on bASA, he is on ACE/ARB, and denies  paresthesia of the feet and polydipsia. Last A1C was:  Lab Results  Component Value Date   HGBA1C 6.6* 05/01/2014  Patient is on Vitamin D supplement. Lab Results  Component Value Date   VD25OH 36 05/01/2014  He is on thyroid medication. His medication was not changed last visit.   Lab Results  Component Value Date   TSH 3.331 05/01/2014  BMI is Body mass index is 36.88 kg/(m^2)., he is working on diet and exercise. Wt Readings from Last 3 Encounters:  08/08/14 257 lb (116.574 kg)  06/07/14 271 lb (122.925 kg)  05/01/14 258 lb (117.028 kg)    Current Medications:  Current Outpatient Prescriptions on File Prior to Visit  Medication Sig Dispense Refill  . amLODipine (NORVASC) 5 MG tablet take 1 tablet by mouth twice a day 60 tablet 3  . aspirin 81 MG tablet Take 81 mg by mouth daily.    .Marland Kitchenatenolol (  TENORMIN) 100 MG tablet take 1 tablet by mouth once daily 90 tablet 1  . cholecalciferol (VITAMIN D) 1000 UNITS tablet Take 1,000 Units by mouth daily.    . fenofibrate micronized (LOFIBRA) 134 MG capsule take 1 capsule by mouth once daily 30 capsule 3  . HYDROcodone-acetaminophen (NORCO/VICODIN) 5-325 MG per tablet Take 1 tablet by mouth every 6 (six) hours as needed for moderate pain. 90 tablet 0  . levothyroxine (SYNTHROID, LEVOTHROID) 50 MCG tablet take 1 tablet by mouth once daily 90 tablet 1  . Magnesium 250  MG TABS Take 500 mg by mouth.     . metFORMIN (GLUCOPHAGE) 500 MG tablet take 1 tablet by mouth twice a day with food 60 tablet 3  . nystatin cream (MYCOSTATIN) Apply 1 application topically 2 (two) times daily. 30 g 1  . Omega-3 Fatty Acids (FISH OIL PO) Take 1,200 mg by mouth daily.    . valsartan (DIOVAN) 320 MG tablet Take 1 tablet (320 mg total) by mouth daily. 30 tablet 3   No current facility-administered medications on file prior to visit.   Medical History:  Past Medical History  Diagnosis Date  . Hypertension   . Hyperlipidemia   . Diabetes mellitus without complication   . Thyroid disease   . Vitamin D deficiency   . Allergy   . Arthritis     lumbar   Allergies: No Known Allergies   Review of Systems:  Review of Systems  Constitutional: Negative.   HENT: Negative.   Eyes: Negative.   Respiratory: Negative.   Cardiovascular: Negative.   Gastrointestinal: Negative.   Genitourinary: Negative.   Musculoskeletal: Positive for myalgias and joint pain (hit hand while moving things, in brace, but improving). Negative for back pain, falls and neck pain.  Skin: Negative.   Neurological: Negative.   Endo/Heme/Allergies: Negative.   Psychiatric/Behavioral: Negative.     Family history- Review and unchanged Social history- Review and unchanged Physical Exam: BP 138/80 mmHg  Pulse 72  Temp(Src) 98.1 F (36.7 C)  Resp 16  Ht _0  (1.778 m)  Wt 257 lb (116.574 kg)  BMI 36.88 kg/m2 Wt Readings from Last 3 Encounters:  08/08/14 257 lb (116.574 kg)  06/07/14 271 lb (122.925 kg)  05/01/14 258 lb (117.028 kg)   General Appearance: Well nourished, in no apparent distress. Eyes: PERRLA, EOMs, conjunctiva no swelling or erythema Sinuses: No Frontal/maxillary tenderness ENT/Mouth: Ext aud canals clear, TMs without erythema, bulging. No erythema, swelling, or exudate on post pharynx.  Tonsils not swollen or erythematous. Hearing normal.  Neck: Supple, thyroid normal.   Respiratory: Respiratory effort normal, BS equal bilaterally without rales, rhonchi, wheezing or stridor.  Cardio: RRR with no MRGs. Brisk peripheral pulses without edema.  Abdomen: Soft, + BS, obese, Non tender, no guarding, rebound, hernias, masses. Lymphatics: Non tender without lymphadenopathy.  Musculoskeletal: Full ROM, 5/5 strength, Normal gait Skin: Warm, dry without rashes, lesions, ecchymosis.  Neuro: Cranial nerves intact. No cerebellar symptoms.  Psych: Awake and oriented X 3, normal affect, Insight and Judgment appropriate.    Vicie Mutters, PA-C 4:40 PM [facility]

## 2014-08-09 LAB — CBC WITH DIFFERENTIAL/PLATELET
BASOS PCT: 0 % (ref 0–1)
Basophils Absolute: 0 10*3/uL (ref 0.0–0.1)
EOS PCT: 2 % (ref 0–5)
Eosinophils Absolute: 0.1 10*3/uL (ref 0.0–0.7)
HCT: 45.2 % (ref 39.0–52.0)
Hemoglobin: 15.2 g/dL (ref 13.0–17.0)
LYMPHS PCT: 30 % (ref 12–46)
Lymphs Abs: 2 10*3/uL (ref 0.7–4.0)
MCH: 32.8 pg (ref 26.0–34.0)
MCHC: 33.6 g/dL (ref 30.0–36.0)
MCV: 97.4 fL (ref 78.0–100.0)
MPV: 10.3 fL (ref 8.6–12.4)
Monocytes Absolute: 0.5 10*3/uL (ref 0.1–1.0)
Monocytes Relative: 8 % (ref 3–12)
NEUTROS PCT: 60 % (ref 43–77)
Neutro Abs: 4.1 10*3/uL (ref 1.7–7.7)
PLATELETS: 299 10*3/uL (ref 150–400)
RBC: 4.64 MIL/uL (ref 4.22–5.81)
RDW: 14 % (ref 11.5–15.5)
WBC: 6.8 10*3/uL (ref 4.0–10.5)

## 2014-08-09 LAB — MAGNESIUM: Magnesium: 2.2 mg/dL (ref 1.5–2.5)

## 2014-08-09 LAB — BASIC METABOLIC PANEL WITH GFR
BUN: 29 mg/dL — ABNORMAL HIGH (ref 6–23)
CO2: 25 meq/L (ref 19–32)
Calcium: 10 mg/dL (ref 8.4–10.5)
Chloride: 105 mEq/L (ref 96–112)
Creat: 1.43 mg/dL — ABNORMAL HIGH (ref 0.50–1.35)
GFR, Est African American: 59 mL/min — ABNORMAL LOW
GFR, Est Non African American: 51 mL/min — ABNORMAL LOW
GLUCOSE: 108 mg/dL — AB (ref 70–99)
Potassium: 4.1 mEq/L (ref 3.5–5.3)
Sodium: 142 mEq/L (ref 135–145)

## 2014-08-09 LAB — SEDIMENTATION RATE: Sed Rate: 7 mm/hr (ref 0–20)

## 2014-08-09 LAB — LIPID PANEL
Cholesterol: 167 mg/dL (ref 0–200)
HDL: 38 mg/dL — ABNORMAL LOW (ref >=40)
LDL Cholesterol: 99 mg/dL (ref 0–99)
Total CHOL/HDL Ratio: 4.4 Ratio
Triglycerides: 149 mg/dL (ref <150)
VLDL: 30 mg/dL (ref 0–40)

## 2014-08-09 LAB — HEPATIC FUNCTION PANEL
ALBUMIN: 4.5 g/dL (ref 3.5–5.2)
ALK PHOS: 62 U/L (ref 39–117)
ALT: 25 U/L (ref 0–53)
AST: 21 U/L (ref 0–37)
BILIRUBIN INDIRECT: 0.3 mg/dL (ref 0.2–1.2)
BILIRUBIN TOTAL: 0.4 mg/dL (ref 0.2–1.2)
Bilirubin, Direct: 0.1 mg/dL (ref 0.0–0.3)
Total Protein: 7.4 g/dL (ref 6.0–8.3)

## 2014-08-09 LAB — TSH: TSH: 2.278 u[IU]/mL (ref 0.350–4.500)

## 2014-08-09 LAB — VITAMIN D 25 HYDROXY (VIT D DEFICIENCY, FRACTURES): Vit D, 25-Hydroxy: 58 ng/mL (ref 30–100)

## 2014-08-09 LAB — HEMOGLOBIN A1C
Hgb A1c MFr Bld: 6.4 % — ABNORMAL HIGH (ref <5.7)
Mean Plasma Glucose: 137 mg/dL — ABNORMAL HIGH (ref <117)

## 2014-08-09 LAB — URIC ACID: Uric Acid, Serum: 8.3 mg/dL — ABNORMAL HIGH (ref 4.0–7.8)

## 2014-08-09 LAB — CK: Total CK: 517 U/L — ABNORMAL HIGH (ref 7–232)

## 2014-08-09 LAB — INSULIN, FASTING: Insulin fasting, serum: 17 u[IU]/mL (ref 2.0–19.6)

## 2014-08-09 MED ORDER — ALLOPURINOL 300 MG PO TABS: ORAL_TABLET | ORAL | Status: DC

## 2014-08-09 MED ORDER — COLCHICINE 0.6 MG PO TABS: 0.6000 mg | ORAL_TABLET | Freq: Two times a day (BID) | ORAL | Status: DC

## 2014-08-09 NOTE — Addendum Note (Signed)
Addended by: Vladimir Crofts on: 08/09/2014 08:46 AM   Modules accepted: Orders

## 2014-08-20 ENCOUNTER — Other Ambulatory Visit: Payer: Self-pay | Admitting: Physician Assistant

## 2014-08-20 ENCOUNTER — Other Ambulatory Visit: Payer: BLUE CROSS/BLUE SHIELD

## 2014-08-20 DIAGNOSIS — E1122 Type 2 diabetes mellitus with diabetic chronic kidney disease: Secondary | ICD-10-CM

## 2014-08-20 DIAGNOSIS — M791 Myalgia, unspecified site: Secondary | ICD-10-CM

## 2014-08-20 DIAGNOSIS — N182 Chronic kidney disease, stage 2 (mild): Secondary | ICD-10-CM

## 2014-08-20 DIAGNOSIS — M109 Gout, unspecified: Secondary | ICD-10-CM

## 2014-08-21 LAB — BASIC METABOLIC PANEL WITH GFR
BUN: 12 mg/dL (ref 6–23)
CHLORIDE: 102 meq/L (ref 96–112)
CO2: 25 mEq/L (ref 19–32)
Calcium: 9.5 mg/dL (ref 8.4–10.5)
Creat: 0.97 mg/dL (ref 0.50–1.35)
GFR, Est African American: >89 mL/min
GFR, Est Non African American: 82 mL/min
GLUCOSE: 93 mg/dL (ref 70–99)
Potassium: 4.1 mEq/L (ref 3.5–5.3)
Sodium: 139 mEq/L (ref 135–145)

## 2014-08-21 LAB — URIC ACID: Uric Acid, Serum: 5.2 mg/dL (ref 4.0–7.8)

## 2014-08-21 LAB — CK: Total CK: 73 U/L (ref 7–232)

## 2014-09-07 ENCOUNTER — Other Ambulatory Visit: Payer: Self-pay | Admitting: Physician Assistant

## 2014-10-09 ENCOUNTER — Other Ambulatory Visit: Payer: Self-pay | Admitting: Physician Assistant

## 2014-11-10 ENCOUNTER — Other Ambulatory Visit: Payer: Self-pay | Admitting: Physician Assistant

## 2014-11-11 ENCOUNTER — Other Ambulatory Visit: Payer: Self-pay | Admitting: Internal Medicine

## 2014-11-12 ENCOUNTER — Encounter: Payer: Self-pay | Admitting: Physician Assistant

## 2014-11-12 ENCOUNTER — Ambulatory Visit (INDEPENDENT_AMBULATORY_CARE_PROVIDER_SITE_OTHER): Payer: BLUE CROSS/BLUE SHIELD | Admitting: Physician Assistant

## 2014-11-12 VITALS — BP 138/80 | HR 72 | Temp 97.7°F | Resp 16 | Ht 70.0 in | Wt 270.0 lb

## 2014-11-12 DIAGNOSIS — N182 Chronic kidney disease, stage 2 (mild): Secondary | ICD-10-CM

## 2014-11-12 DIAGNOSIS — E669 Obesity, unspecified: Secondary | ICD-10-CM

## 2014-11-12 DIAGNOSIS — M109 Gout, unspecified: Secondary | ICD-10-CM | POA: Insufficient documentation

## 2014-11-12 DIAGNOSIS — Z79899 Other long term (current) drug therapy: Secondary | ICD-10-CM

## 2014-11-12 DIAGNOSIS — E079 Disorder of thyroid, unspecified: Secondary | ICD-10-CM

## 2014-11-12 DIAGNOSIS — G4733 Obstructive sleep apnea (adult) (pediatric): Secondary | ICD-10-CM

## 2014-11-12 DIAGNOSIS — E1122 Type 2 diabetes mellitus with diabetic chronic kidney disease: Secondary | ICD-10-CM

## 2014-11-12 DIAGNOSIS — E559 Vitamin D deficiency, unspecified: Secondary | ICD-10-CM

## 2014-11-12 DIAGNOSIS — I10 Essential (primary) hypertension: Secondary | ICD-10-CM | POA: Insufficient documentation

## 2014-11-12 DIAGNOSIS — I4891 Unspecified atrial fibrillation: Secondary | ICD-10-CM

## 2014-11-12 DIAGNOSIS — E785 Hyperlipidemia, unspecified: Secondary | ICD-10-CM

## 2014-11-12 DIAGNOSIS — I499 Cardiac arrhythmia, unspecified: Secondary | ICD-10-CM

## 2014-11-12 LAB — CBC WITH DIFFERENTIAL/PLATELET
BASOS ABS: 0 10*3/uL (ref 0.0–0.1)
Basophils Relative: 0 % (ref 0–1)
Eosinophils Absolute: 0.2 10*3/uL (ref 0.0–0.7)
Eosinophils Relative: 3 % (ref 0–5)
HCT: 44.9 % (ref 39.0–52.0)
HEMOGLOBIN: 15.7 g/dL (ref 13.0–17.0)
LYMPHS PCT: 36 % (ref 12–46)
Lymphs Abs: 2.4 10*3/uL (ref 0.7–4.0)
MCH: 33.5 pg (ref 26.0–34.0)
MCHC: 35 g/dL (ref 30.0–36.0)
MCV: 95.9 fL (ref 78.0–100.0)
MONO ABS: 0.5 10*3/uL (ref 0.1–1.0)
MONOS PCT: 7 % (ref 3–12)
MPV: 10.8 fL (ref 8.6–12.4)
NEUTROS ABS: 3.7 10*3/uL (ref 1.7–7.7)
Neutrophils Relative %: 54 % (ref 43–77)
PLATELETS: 204 10*3/uL (ref 150–400)
RBC: 4.68 MIL/uL (ref 4.22–5.81)
RDW: 14.7 % (ref 11.5–15.5)
WBC: 6.8 10*3/uL (ref 4.0–10.5)

## 2014-11-12 MED ORDER — HYDROCODONE-ACETAMINOPHEN 5-325 MG PO TABS: 1.0000 | ORAL_TABLET | Freq: Four times a day (QID) | ORAL | Status: DC | PRN

## 2014-11-12 MED ORDER — RIVAROXABAN 20 MG PO TABS: 20.0000 mg | ORAL_TABLET | Freq: Every day | ORAL | Status: DC

## 2014-11-12 NOTE — Progress Notes (Signed)
Assessment and Plan:  1. Hypertension -Continue medication, monitor blood pressure at home. Continue DASH diet.  Reminder to go to the ER if any CP, SOB, nausea, dizziness, severe HA, changes vision/speech, left arm numbness and tingling and jaw pain.  2. Cholesterol -Continue diet and exercise. Check cholesterol.   3. Diabetes with diabetic chronic kidney disease -Continue diet and exercise. Check A1C Recheck BMP/GFR  4. Vitamin D Def - check level and continue medications.   5. Hypothyroidism -check TSH level, continue medications the same, reminded to take on an empty stomach 30-32mins before food.   6. Obesity with co morbidities - long discussion about weight loss, diet, and exercise  7. Gout - recheck Uric acid as needed, Diet discussed, continue medications.  8. Afib rate controlled Continue atenolol, will add xarelto, discussed afib, stroke risk + OSA and sleep study 2 months ago, will call [place] [facility] about CPAP Follow up 1 month CHA2DS2VASc Score: Score of 0 is very low risk, score of 1 is intermediate risk rate of 0.6% at 1 year, score greater than 1 is high risk rate of 3% rate at 1 year.   CHA2DS2-VASc Risk Criteria Score  CHF 1 point  HTN 1 point  Age <65 0 points  Age 72-74 1 point  Age >/= 15 2 points  Diabetes 1 point  Stroke/TIA/Thromboembolism History 2 points  Vascular Disease History/PAD 1 point  Male or Male Male=0 points   Male=1 point   Your Score is ___2__.      Continue diet and meds as discussed. Further disposition pending results of labs. Discussed med's effects and SE's.    HPI 64 y.o. male  presents for 3 month follow up  has a past medical history of Hypertension; Hyperlipidemia; Diabetes mellitus without complication; Thyroid disease; Vitamin D deficiency; Allergy; and Arthritis.  His blood pressure has been controlled at home, today his BP is BP: 138/80 mmHg.  He does workout, he is very active at work. He denies chest pain,  shortness of breath, dizziness.  He is on cholesterol medication, fenofibrate stopped due to myalgias and elevated CPK. His cholesterol is not at goal. The cholesterol was:   Lab Results  Component Value Date   CHOL 167 08/08/2014   HDL 38* 08/08/2014   LDLCALC 99 08/08/2014   TRIG 149 08/08/2014   CHOLHDL 4.4 08/08/2014   Lab Results  Component Value Date   CKTOTAL 73 08/20/2014   His last kidney function was worse, will recheck  He was started on allopurinol for elevated uric acid.  Very physical with his job and uses vicodin PRN, can not use NSAIDS.   He has been working on diet and exercise for diabetes with diabetic chronic kidney disease, he is on bASA, he is on ACE/ARB, and denies  paresthesia of the feet and polydipsia. Last A1C was:  Lab Results  Component Value Date   HGBA1C 6.4* 08/08/2014  Patient is on Vitamin D supplement. Lab Results  Component Value Date   VD25OH 28 08/08/2014  He is on thyroid medication. His medication was not changed last visit.   Lab Results  Component Value Date   TSH 2.278 08/08/2014  BMI is Body mass index is 38.74 kg/(m^2)., he is working on diet and exercise. Wt Readings from Last 3 Encounters:  11/12/14 270 lb (122.471 kg)  08/08/14 257 lb (116.574 kg)  06/07/14 271 lb (122.925 kg)    Current Medications:  Current Outpatient Prescriptions on File Prior to Visit  Medication  Sig Dispense Refill  . allopurinol (ZYLOPRIM) 300 MG tablet take 1/2 tablet by mouth daily for FIRST 2 WEEKS, THEN MAY INCREASE to 1 tablet daily 30 tablet 2  . amLODipine (NORVASC) 5 MG tablet take 1 tablet by mouth twice a day 60 tablet 3  . aspirin 81 MG tablet Take 81 mg by mouth daily.    [name] atenolol (TENORMIN) 100 MG tablet take 1 tablet by mouth once daily 90 tablet 1  . cholecalciferol (VITAMIN D) 1000 UNITS tablet Take 1,000 Units by mouth daily.    . colchicine 0.6 MG tablet take 1 tablet by mouth twice a day 60 tablet 2  . HYDROcodone-acetaminophen  (NORCO/VICODIN) 5-325 MG per tablet Take 1 tablet by mouth every 6 (six) hours as needed for moderate pain. 90 tablet 0  . levothyroxine (SYNTHROID, LEVOTHROID) 50 MCG tablet take 1 tablet by mouth once daily 90 tablet 0  . Magnesium 250 MG TABS Take 500 mg by mouth.     . metFORMIN (GLUCOPHAGE) 500 MG tablet take 1 tablet by mouth twice a day with food 60 tablet 3  . nystatin cream (MYCOSTATIN) Apply 1 application topically 2 (two) times daily. 30 g 1  . Omega-3 Fatty Acids (FISH OIL PO) Take 1,200 mg by mouth daily.    . valsartan (DIOVAN) 320 MG tablet take 1 tablet by mouth once daily 30 tablet 3   No current facility-administered medications on file prior to visit.   Medical History:  Past Medical History  Diagnosis Date  . Hypertension   . Hyperlipidemia   . Diabetes mellitus without complication   . Thyroid disease   . Vitamin D deficiency   . Allergy   . Arthritis     lumbar   Allergies: No Known Allergies   Review of Systems:  Review of Systems  Constitutional: Negative.   HENT: Negative.   Eyes: Negative.   Respiratory: Positive for shortness of breath. Negative for cough, hemoptysis, sputum production and wheezing.   Cardiovascular: Positive for leg swelling. Negative for chest pain, palpitations, orthopnea, claudication and PND.  Gastrointestinal: Negative.   Genitourinary: Negative.   Musculoskeletal: Positive for myalgias, back pain and joint pain. Negative for falls and neck pain.  Skin: Negative.   Neurological: Negative.   Endo/Heme/Allergies: Negative.   Psychiatric/Behavioral: Negative.     Family history- Review and unchanged Social history- Review and unchanged Physical Exam: BP 138/80 mmHg  Pulse 72  Temp(Src) 97.7 F (36.5 C)  Resp 16  Ht 5\' 10"  (1.778 m)  Wt 270 lb (122.471 kg)  BMI 38.74 kg/m2 Wt Readings from Last 3 Encounters:  11/12/14 270 lb (122.471 kg)  08/08/14 257 lb (116.574 kg)  06/07/14 271 lb (122.925 kg)   General  Appearance: Well nourished, in no apparent distress. Eyes: PERRLA, EOMs, conjunctiva no swelling or erythema Sinuses: No Frontal/maxillary tenderness ENT/Mouth: Ext aud canals clear, TMs without erythema, bulging. No erythema, swelling, or exudate on post pharynx.  Tonsils not swollen or erythematous. Hearing normal.  Neck: Supple, thyroid normal.  Respiratory: Respiratory effort normal, BS equal bilaterally without rales, rhonchi, wheezing or stridor.  Cardio: Irreg irreg with no MRGs. Brisk peripheral pulses with 2+ edema.  Abdomen: Soft, + BS, obese, Non tender, no guarding, rebound, hernias, masses. Lymphatics: Non tender without lymphadenopathy.  Musculoskeletal: Full ROM, 5/5 strength, Normal gait Skin: Right ear with scaly area. Warm, dry without rashes, lesions, ecchymosis.  Neuro: Cranial nerves intact. No cerebellar symptoms.  Psych: Awake and oriented X 3,  normal affect, Insight and Judgment appropriate.    Vicie Mutters, PA-C 4:37 PM [facility]

## 2014-11-12 NOTE — Patient Instructions (Addendum)
Put hydrocortisone on your left ear, if it is not better will send you to skin surgery center for evaluation.  Start xarelto 20mg  one pill at night  This is a blood thinner, call the office if you have any black stool, nose bleeds, blood in your urine.  Go to the ER if you have any chest pain, shortness of breath or dizziness.   Atrial Fibrillation Atrial fibrillation is a type of irregular heart rhythm (arrhythmia). During atrial fibrillation, the upper chambers of the heart (atria) quiver continuously in a chaotic pattern. This causes an irregular and often rapid heart rate.  Atrial fibrillation is the result of the heart becoming overloaded with disorganized signals that tell it to beat. These signals are normally released one at a time by a part of the right atrium called the sinoatrial node. They then travel from the atria to the lower chambers of the heart (ventricles), causing the atria and ventricles to contract and pump blood as they pass. In atrial fibrillation, parts of the atria outside of the sinoatrial node also release these signals. This results in two problems. First, the atria receive so many signals that they do not have time to fully contract. Second, the ventricles, which can only receive one signal at a time, beat irregularly and out of rhythm with the atria.  There are three types of atrial fibrillation:   Paroxysmal. Paroxysmal atrial fibrillation starts suddenly and stops on its own within a week.  Persistent. Persistent atrial fibrillation lasts for more than a week. It may stop on its own or with treatment.  Permanent. Permanent atrial fibrillation does not go away. Episodes of atrial fibrillation may lead to permanent atrial fibrillation. Atrial fibrillation can prevent your heart from pumping blood normally. It increases your risk of stroke and can lead to heart failure.  CAUSES   Heart conditions, including a heart attack, heart failure, coronary artery disease, and  heart valve conditions.   Inflammation of the sac that surrounds the heart (pericarditis).  Blockage of an artery in the lungs (pulmonary embolism).  Pneumonia or other infections.  Chronic lung disease.  Thyroid problems, especially if the thyroid is overactive (hyperthyroidism).  Caffeine, excessive alcohol use, and use of some illegal drugs.   Use of some medicines, including certain decongestants and diet pills.  Heart surgery.   Birth defects.  Sometimes, no cause can be found. When this happens, the atrial fibrillation is called lone atrial fibrillation. The risk of complications from atrial fibrillation increases if you have lone atrial fibrillation and you are age 2 years or older. RISK FACTORS  Heart failure.  Coronary artery disease.  Diabetes mellitus.   High blood pressure (hypertension).   Obesity.   Other arrhythmias.   Increased age. SIGNS AND SYMPTOMS   A feeling that your heart is beating rapidly or irregularly.   A feeling of discomfort or pain in your chest.   Shortness of breath.   Sudden light-headedness or weakness.   Getting tired easily when exercising.   Urinating more often than normal (mainly when atrial fibrillation first begins).  In paroxysmal atrial fibrillation, symptoms may start and suddenly stop. DIAGNOSIS  Your health care provider may be able to detect atrial fibrillation when taking your pulse. Your health care provider may have you take a test called an ambulatory electrocardiogram (ECG). An ECG records your heartbeat patterns over a 24-hour period. You may also have other tests, such as:  Transthoracic echocardiogram (TTE). During echocardiography, sound waves are used  to evaluate how blood flows through your heart.  Transesophageal echocardiogram (TEE).  Stress test. There is more than one type of stress test. If a stress test is needed, ask your health care provider about which type is best for  you.  Chest X-ray exam.  Blood tests.  Computed tomography (CT). TREATMENT  Treatment may include:  Treating any underlying conditions. For example, if you have an overactive thyroid, treating the condition may correct atrial fibrillation.  Taking medicine. Medicines may be given to control a rapid heart rate or to prevent blood clots, heart failure, or a stroke.  Having a procedure to correct the rhythm of the heart:  Electrical cardioversion. During electrical cardioversion, a controlled, low-energy shock is delivered to the heart through your skin. If you have chest pain, very low blood pressure, or sudden heart failure, this procedure may need to be done as an emergency.  Catheter ablation. During this procedure, heart tissues that send the signals that cause atrial fibrillation are destroyed.  Surgical ablation. During this surgery, thin lines of heart tissue that carry the abnormal signals are destroyed. This procedure can either be an open-heart surgery or a minimally invasive surgery. With the minimally invasive surgery, small cuts are made to access the heart instead of a large opening.  Pulmonary venous isolation. During this surgery, tissue around the veins that carry blood from the lungs (pulmonary veins) is destroyed. This tissue is thought to carry the abnormal signals. HOME CARE INSTRUCTIONS   Take medicines only as directed by your health care provider. Some medicines can make atrial fibrillation worse or recur.  If blood thinners were prescribed by your health care provider, take them exactly as directed. Too much blood-thinning medicine can cause bleeding. If you take too little, you will not have the needed protection against stroke and other problems.  Perform blood tests at home if directed by your health care provider. Perform blood tests exactly as directed.  Quit smoking if you smoke.  Do not drink alcohol.  Do not drink caffeinated beverages such as coffee,  soda, and some teas. You may drink decaffeinated coffee, soda, or tea.   Maintain a healthy weight.Do not use diet pills unless your health care provider approves. They may make heart problems worse.   Follow diet instructions as directed by your health care provider.  Exercise regularly as directed by your health care provider.  Keep all follow-up visits as directed by your health care provider. This is important. PREVENTION  The following substances can cause atrial fibrillation to recur:   Caffeinated beverages.  Alcohol.  Certain medicines, especially those used for breathing problems.  Certain herbs and herbal medicines, such as those containing ephedra or ginseng.  Illegal drugs, such as cocaine and amphetamines. Sometimes medicines are given to prevent atrial fibrillation from recurring. Proper treatment of any underlying condition is also important in helping prevent recurrence.  SEEK MEDICAL CARE IF:  You notice a change in the rate, rhythm, or strength of your heartbeat.  You suddenly begin urinating more frequently.  You tire more easily when exerting yourself or exercising. SEEK IMMEDIATE MEDICAL CARE IF:   You have chest pain, abdominal pain, sweating, or weakness.  You feel nauseous.  You have shortness of breath.  You suddenly have swollen feet and ankles.  You feel dizzy.  Your face or limbs feel numb or weak.  You have a change in your vision or speech. MAKE SURE YOU:   Understand these instructions.  Will watch  your condition.  Will get help right away if you are not doing well or get worse. Document Released: 05/11/2005 Document Revised: 09/25/2013 Document Reviewed: 06/21/2012 [facility] Patient Information 2015 [place], [place]. This information is not intended to replace advice given to you by your health care provider. Make sure you discuss any questions you have with your health care provider.

## 2014-11-13 LAB — LIPID PANEL
CHOL/HDL RATIO: 4 ratio
Cholesterol: 165 mg/dL (ref 0–200)
HDL: 41 mg/dL (ref >=40)
LDL CALC: 90 mg/dL (ref 0–99)
Triglycerides: 170 mg/dL — ABNORMAL HIGH (ref <150)
VLDL: 34 mg/dL (ref 0–40)

## 2014-11-13 LAB — BASIC METABOLIC PANEL WITH GFR
BUN: 25 mg/dL — ABNORMAL HIGH (ref 6–23)
CALCIUM: 9.8 mg/dL (ref 8.4–10.5)
CO2: 27 mEq/L (ref 19–32)
Chloride: 104 mEq/L (ref 96–112)
Creat: 1.14 mg/dL (ref 0.50–1.35)
GFR, EST AFRICAN AMERICAN: 78 mL/min
GFR, EST NON AFRICAN AMERICAN: 68 mL/min
GLUCOSE: 84 mg/dL (ref 70–99)
POTASSIUM: 3.9 meq/L (ref 3.5–5.3)
SODIUM: 142 meq/L (ref 135–145)

## 2014-11-13 LAB — HEMOGLOBIN A1C
HEMOGLOBIN A1C: 6.6 % — AB (ref <5.7)
Mean Plasma Glucose: 143 mg/dL — ABNORMAL HIGH (ref <117)

## 2014-11-13 LAB — HEPATIC FUNCTION PANEL
ALT: 36 U/L (ref 0–53)
AST: 30 U/L (ref 0–37)
Albumin: 4 g/dL (ref 3.5–5.2)
Alkaline Phosphatase: 70 U/L (ref 39–117)
BILIRUBIN INDIRECT: 0.6 mg/dL (ref 0.2–1.2)
Bilirubin, Direct: 0.1 mg/dL (ref 0.0–0.3)
Total Bilirubin: 0.7 mg/dL (ref 0.2–1.2)
Total Protein: 7.1 g/dL (ref 6.0–8.3)

## 2014-11-13 LAB — URIC ACID: URIC ACID, SERUM: 6.3 mg/dL (ref 4.0–7.8)

## 2014-11-13 LAB — INSULIN, FASTING: INSULIN FASTING, SERUM: 10.2 u[IU]/mL (ref 2.0–19.6)

## 2014-11-13 LAB — VITAMIN D 25 HYDROXY (VIT D DEFICIENCY, FRACTURES): VIT D 25 HYDROXY: 54 ng/mL (ref 30–100)

## 2014-11-13 LAB — MAGNESIUM: Magnesium: 1.9 mg/dL (ref 1.5–2.5)

## 2014-11-13 LAB — TSH: TSH: 3.061 u[IU]/mL (ref 0.350–4.500)

## 2014-12-17 ENCOUNTER — Ambulatory Visit (INDEPENDENT_AMBULATORY_CARE_PROVIDER_SITE_OTHER): Payer: BLUE CROSS/BLUE SHIELD | Admitting: Physician Assistant

## 2014-12-17 ENCOUNTER — Encounter: Payer: Self-pay | Admitting: Physician Assistant

## 2014-12-17 VITALS — BP 148/86 | HR 88 | Temp 97.8°F | Resp 18 | Ht 70.0 in | Wt 262.0 lb

## 2014-12-17 DIAGNOSIS — I481 Persistent atrial fibrillation: Secondary | ICD-10-CM

## 2014-12-17 DIAGNOSIS — N179 Acute kidney failure, unspecified: Secondary | ICD-10-CM

## 2014-12-17 DIAGNOSIS — I4819 Other persistent atrial fibrillation: Secondary | ICD-10-CM

## 2014-12-17 LAB — BASIC METABOLIC PANEL WITH GFR
BUN: 23 mg/dL (ref 7–25)
CO2: 28 mmol/L (ref 20–31)
CREATININE: 1.22 mg/dL (ref 0.70–1.25)
Calcium: 10.1 mg/dL (ref 8.6–10.3)
Chloride: 105 mmol/L (ref 98–110)
GFR, Est African American: 72 mL/min (ref >=60)
GFR, Est Non African American: 62 mL/min (ref >=60)
Glucose, Bld: 96 mg/dL (ref 65–99)
Potassium: 4 mmol/L (ref 3.5–5.3)
Sodium: 144 mmol/L (ref 135–146)

## 2014-12-17 MED ORDER — VERAPAMIL HCL ER 180 MG PO TBCR: 180.0000 mg | EXTENDED_RELEASE_TABLET | Freq: Every day | ORAL | Status: DC

## 2014-12-17 NOTE — Progress Notes (Signed)
Assessment and Plan: AKI- increase water, check labs.  Afib- continue atenolol and xaretlo, will add verapamil 180 QD, may increase to BID, has had bradycardia in the past, will monitor closely, declines cardio referral at this time but may refer for cardioversion if needed OSA- continue CPAP, will contact [employer] to get get humidifier better.   Future Appointments Date Time Provider [place]  05/16/2015 2:00 PM Vicie Mutters, PA-C [id number] None    HPI 64 y.o.male presents for follow up for new on set Afib. Patient was seen at 3 month OV on 11/12/2014 when it was noticed that his heart rate was irreg, confirmed with EKG that it was Afib with controlled rate. His CHADSVASC risk was 2 so he was started on xarelto. He was having some SOB with exertion at work which is very physcial but no CP, dizziness. Currently denies SOB, CP, dizziness, he does have some swelling in bilateral legs, denies orthopnea/PND.[name]  He had a + sleep study and is on CPAP but states he has very dry mouth with it, uses [employer].   In addition his labs showed that his kidney function was worse, will recheck today.  Lab Results  Component Value Date   CREATININE 1.14 11/12/2014   BUN 25* 11/12/2014   NA 142 11/12/2014   K 3.9 11/12/2014   CL 104 11/12/2014   CO2 27 11/12/2014    Height 5\' 10"  (1.778 m), weight 262 lb (118.842 kg).  Past Medical History  Diagnosis Date  . Hypertension   . Hyperlipidemia   . Diabetes mellitus without complication   . Thyroid disease   . Vitamin D deficiency   . Allergy   . Arthritis     lumbar     No Known Allergies    Current Outpatient Prescriptions on File Prior to Visit  Medication Sig Dispense Refill  . allopurinol (ZYLOPRIM) 300 MG tablet take 1/2 tablet by mouth daily for FIRST 2 WEEKS, THEN MAY INCREASE to 1 tablet daily 30 tablet 2  . amLODipine (NORVASC) 5 MG tablet take 1 tablet by mouth twice a day 60 tablet 3  . atenolol (TENORMIN) 100 MG tablet  take 1 tablet by mouth once daily 90 tablet 1  . cholecalciferol (VITAMIN D) 1000 UNITS tablet Take 1,000 Units by mouth daily.    . colchicine 0.6 MG tablet take 1 tablet by mouth twice a day 60 tablet 2  . HYDROcodone-acetaminophen (NORCO/VICODIN) 5-325 MG per tablet Take 1 tablet by mouth every 6 (six) hours as needed for moderate pain. 90 tablet 0  . levothyroxine (SYNTHROID, LEVOTHROID) 50 MCG tablet take 1 tablet by mouth once daily 90 tablet 0  . Magnesium 250 MG TABS Take 500 mg by mouth.     . metFORMIN (GLUCOPHAGE) 500 MG tablet take 1 tablet by mouth twice a day with food 60 tablet 3  . nystatin cream (MYCOSTATIN) Apply 1 application topically 2 (two) times daily. 30 g 1  . Omega-3 Fatty Acids (FISH OIL PO) Take 1,200 mg by mouth daily.    . rivaroxaban (XARELTO) 20 MG TABS tablet Take 1 tablet (20 mg total) by mouth daily with supper. 30 tablet 3  . valsartan (DIOVAN) 320 MG tablet take 1 tablet by mouth once daily 30 tablet 3   No current facility-administered medications on file prior to visit.    ROS: all negative except above.   Physical Exam: Filed Weights   12/17/14 1637  Weight: 262 lb (118.842 kg)   Ht 5'  10" (1.778 m)  Wt 262 lb (118.842 kg)  BMI 37.59 kg/m2 General Appearance: Well nourished, in no apparent distress. Eyes: PERRLA, EOMs, conjunctiva no swelling or erythema Sinuses: No Frontal/maxillary tenderness ENT/Mouth: Ext aud canals clear, TMs without erythema, bulging. No erythema, swelling, or exudate on post pharynx.  Tonsils not swollen or erythematous. Hearing normal.  Neck: Supple, thyroid normal.  Respiratory: Respiratory effort normal, BS equal bilaterally without rales, rhonchi, wheezing or stridor.  Cardio: Irreg, irreg with no MRGs. Brisk peripheral pulses with 1+ edema.  Abdomen: Soft, + BS.  Non tender, no guarding, rebound, hernias, masses. Lymphatics: Non tender without lymphadenopathy.  Musculoskeletal: Full ROM, 5/5 strength, normal gait.   Skin: Warm, dry without rashes, lesions, ecchymosis.  Neuro: Cranial nerves intact. Normal muscle tone, no cerebellar symptoms. Sensation intact.  Psych: Awake and oriented X 3, normal affect, Insight and Judgment appropriate.     Vicie Mutters, PA-C 4:44 PM [facility]

## 2014-12-17 NOTE — Patient Instructions (Addendum)
Continue xarelto, add verapamil at night, monitor heart rate and BP Follow up one month   Go to the ER if any CP, SOB, nausea, dizziness, severe HA, changes vision/speech   Atrial Fibrillation Atrial fibrillation is a type of irregular heart rhythm (arrhythmia). During atrial fibrillation, the upper chambers of the heart (atria) quiver continuously in a chaotic pattern. This causes an irregular and often rapid heart rate.  Atrial fibrillation is the result of the heart becoming overloaded with disorganized signals that tell it to beat. These signals are normally released one at a time by a part of the right atrium called the sinoatrial node. They then travel from the atria to the lower chambers of the heart (ventricles), causing the atria and ventricles to contract and pump blood as they pass. In atrial fibrillation, parts of the atria outside of the sinoatrial node also release these signals. This results in two problems. First, the atria receive so many signals that they do not have time to fully contract. Second, the ventricles, which can only receive one signal at a time, beat irregularly and out of rhythm with the atria.  There are three types of atrial fibrillation:   Paroxysmal. Paroxysmal atrial fibrillation starts suddenly and stops on its own within a week.  Persistent. Persistent atrial fibrillation lasts for more than a week. It may stop on its own or with treatment.  Permanent. Permanent atrial fibrillation does not go away. Episodes of atrial fibrillation may lead to permanent atrial fibrillation. Atrial fibrillation can prevent your heart from pumping blood normally. It increases your risk of stroke and can lead to heart failure.  CAUSES   Heart conditions, including a heart attack, heart failure, coronary artery disease, and heart valve conditions.   Inflammation of the sac that surrounds the heart (pericarditis).  Blockage of an artery in the lungs (pulmonary  embolism).  Pneumonia or other infections.  Chronic lung disease.  Thyroid problems, especially if the thyroid is overactive (hyperthyroidism).  Caffeine, excessive alcohol use, and use of some illegal drugs.   Use of some medicines, including certain decongestants and diet pills.  Heart surgery.   Birth defects.  Sometimes, no cause can be found. When this happens, the atrial fibrillation is called lone atrial fibrillation. The risk of complications from atrial fibrillation increases if you have lone atrial fibrillation and you are age 63 years or older. RISK FACTORS  Heart failure.  Coronary artery disease.  Diabetes mellitus.   High blood pressure (hypertension).   Obesity.   Other arrhythmias.   Increased age. SIGNS AND SYMPTOMS   A feeling that your heart is beating rapidly or irregularly.   A feeling of discomfort or pain in your chest.   Shortness of breath.   Sudden light-headedness or weakness.   Getting tired easily when exercising.   Urinating more often than normal (mainly when atrial fibrillation first begins).  In paroxysmal atrial fibrillation, symptoms may start and suddenly stop. DIAGNOSIS  Your health care provider may be able to detect atrial fibrillation when taking your pulse. Your health care provider may have you take a test called an ambulatory electrocardiogram (ECG). An ECG records your heartbeat patterns over a 24-hour period. You may also have other tests, such as:  Transthoracic echocardiogram (TTE). During echocardiography, sound waves are used to evaluate how blood flows through your heart.  Transesophageal echocardiogram (TEE).  Stress test. There is more than one type of stress test. If a stress test is needed, ask your health care  provider about which type is best for you.  Chest X-ray exam.  Blood tests.  Computed tomography (CT). TREATMENT  Treatment may include:  Treating any underlying conditions. For  example, if you have an overactive thyroid, treating the condition may correct atrial fibrillation.  Taking medicine. Medicines may be given to control a rapid heart rate or to prevent blood clots, heart failure, or a stroke.  Having a procedure to correct the rhythm of the heart:  Electrical cardioversion. During electrical cardioversion, a controlled, low-energy shock is delivered to the heart through your skin. If you have chest pain, very low blood pressure, or sudden heart failure, this procedure may need to be done as an emergency.  Catheter ablation. During this procedure, heart tissues that send the signals that cause atrial fibrillation are destroyed.  Surgical ablation. During this surgery, thin lines of heart tissue that carry the abnormal signals are destroyed. This procedure can either be an open-heart surgery or a minimally invasive surgery. With the minimally invasive surgery, small cuts are made to access the heart instead of a large opening.  Pulmonary venous isolation. During this surgery, tissue around the veins that carry blood from the lungs (pulmonary veins) is destroyed. This tissue is thought to carry the abnormal signals. HOME CARE INSTRUCTIONS   Take medicines only as directed by your health care provider. Some medicines can make atrial fibrillation worse or recur.  If blood thinners were prescribed by your health care provider, take them exactly as directed. Too much blood-thinning medicine can cause bleeding. If you take too little, you will not have the needed protection against stroke and other problems.  Perform blood tests at home if directed by your health care provider. Perform blood tests exactly as directed.  Quit smoking if you smoke.  Do not drink alcohol.  Do not drink caffeinated beverages such as coffee, soda, and some teas. You may drink decaffeinated coffee, soda, or tea.   Maintain a healthy weight.Do not use diet pills unless your health care  provider approves. They may make heart problems worse.   Follow diet instructions as directed by your health care provider.  Exercise regularly as directed by your health care provider.  Keep all follow-up visits as directed by your health care provider. This is important. PREVENTION  The following substances can cause atrial fibrillation to recur:   Caffeinated beverages.  Alcohol.  Certain medicines, especially those used for breathing problems.  Certain herbs and herbal medicines, such as those containing ephedra or ginseng.  Illegal drugs, such as cocaine and amphetamines. Sometimes medicines are given to prevent atrial fibrillation from recurring. Proper treatment of any underlying condition is also important in helping prevent recurrence.  SEEK MEDICAL CARE IF:  You notice a change in the rate, rhythm, or strength of your heartbeat.  You suddenly begin urinating more frequently.  You tire more easily when exerting yourself or exercising. SEEK IMMEDIATE MEDICAL CARE IF:   You have chest pain, abdominal pain, sweating, or weakness.  You feel nauseous.  You have shortness of breath.  You suddenly have swollen feet and ankles.  You feel dizzy.  Your face or limbs feel numb or weak.  You have a change in your vision or speech. MAKE SURE YOU:   Understand these instructions.  Will watch your condition.  Will get help right away if you are not doing well or get worse. Document Released: 05/11/2005 Document Revised: 09/25/2013 Document Reviewed: 06/21/2012 [facility] Patient Information 2015 [place], [place]. This information  is not intended to replace advice given to you by your health care provider. Make sure you discuss any questions you have with your health care provider.  

## 2014-12-26 NOTE — Progress Notes (Signed)
Faxed request to [facility] to evaluate the CPAP humidifier, causing patient dry mouth. Included original order to [place] and 12-17-14 office note.  Thank you, Leonie Douglas Referral Coordinator  [facility] [phone] ext. [phone] Fax [phone]

## 2015-01-01 ENCOUNTER — Other Ambulatory Visit: Payer: Self-pay | Admitting: Physician Assistant

## 2015-01-08 ENCOUNTER — Other Ambulatory Visit: Payer: Self-pay | Admitting: Internal Medicine

## 2015-01-21 ENCOUNTER — Encounter: Payer: Self-pay | Admitting: Physician Assistant

## 2015-01-21 ENCOUNTER — Ambulatory Visit (INDEPENDENT_AMBULATORY_CARE_PROVIDER_SITE_OTHER): Payer: BLUE CROSS/BLUE SHIELD | Admitting: Physician Assistant

## 2015-01-21 VITALS — BP 140/82 | HR 84 | Temp 98.9°F | Resp 16 | Ht 70.0 in | Wt 266.0 lb

## 2015-01-21 DIAGNOSIS — I481 Persistent atrial fibrillation: Secondary | ICD-10-CM

## 2015-01-21 DIAGNOSIS — I10 Essential (primary) hypertension: Secondary | ICD-10-CM

## 2015-01-21 DIAGNOSIS — I4819 Other persistent atrial fibrillation: Secondary | ICD-10-CM

## 2015-01-21 MED ORDER — VERAPAMIL HCL ER 240 MG PO TBCR: 240.0000 mg | EXTENDED_RELEASE_TABLET | Freq: Every day | ORAL | Status: DC

## 2015-01-21 NOTE — Progress Notes (Signed)
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   Future Appointments Date Time Provider [place]  05/16/2015 2:00 PM Vicie Mutters, PA-C [id number] None    HPI 63 y.o.male presents for 1 month follow up for Afib. On his 3 month visit in June he was found to have afib, he is on atenolol 100, placed on diltiazem and xarelto. He has had bradycardia in the past with high doses of AV nodal medications and we will monitor this closely, high suspicion for  SSS. He has some SOB with exertion at work but denies CP, dizziness, orthopnea, PND. Ekg shows still in Afib  Past Medical History  Diagnosis Date  . Hypertension   . Hyperlipidemia   . Diabetes mellitus without complication   . Thyroid disease   . Vitamin D deficiency   . Allergy   . Arthritis     lumbar     No Known Allergies    Current Outpatient Prescriptions on File Prior to Visit  Medication Sig Dispense Refill  . allopurinol (ZYLOPRIM) 300 MG tablet take 1/2 tablet by mouth daily for FIRST 2 WEEKS, THEN MAY INCREASE to 1 tablet daily 30 tablet 2  . amLODipine (NORVASC) 5 MG tablet take 1 tablet by mouth twice a day 60 tablet 3  . atenolol (TENORMIN) 100 MG tablet take 1 tablet by mouth once daily 90 tablet 1  . cholecalciferol (VITAMIN D) 1000 UNITS tablet Take 1,000 Units by mouth daily.    . colchicine 0.6 MG tablet take 1 tablet by mouth twice a day 60 tablet 2  . HYDROcodone-acetaminophen (NORCO/VICODIN) 5-325 MG per tablet Take 1 tablet by mouth every 6 (six) hours as needed for moderate pain. 90 tablet 0  . levothyroxine (SYNTHROID, LEVOTHROID) 50 MCG tablet take 1 tablet by mouth once daily 90 tablet 0  . Magnesium 250 MG TABS Take 500 mg by mouth.     . metFORMIN (GLUCOPHAGE) 500 MG tablet take 1 tablet by mouth twice a day with  food 60 tablet 3  . nystatin cream (MYCOSTATIN) Apply 1 application topically 2 (two) times daily. 30 g 1  . Omega-3 Fatty Acids (FISH OIL PO) Take 1,200 mg by mouth daily.    . rivaroxaban (XARELTO) 20 MG TABS tablet Take 1 tablet (20 mg total) by mouth daily with supper. 30 tablet 3  . valsartan (DIOVAN) 320 MG tablet take 1 tablet by mouth once daily 30 tablet 3  . verapamil (CALAN-SR) 180 MG CR tablet Take 1 tablet (180 mg total) by mouth at bedtime. 30 tablet 11   No current facility-administered medications on file prior to visit.    ROS: all negative except above.   Physical Exam: Filed Weights   01/21/15 1624  Weight: 266 lb (120.657 kg)   BP 140/82 mmHg  Pulse 84  Temp(Src) 98.9 F (37.2 C)  Resp 16  Ht 5\' 10"  (1.778 m)  Wt 266 lb (120.657 kg)  BMI 38.17 kg/m2 General Appearance: Well nourished, in no apparent distress. Eyes: PERRLA, EOMs, conjunctiva no swelling or erythema Sinuses: No Frontal/maxillary tenderness ENT/Mouth: Ext aud canals clear, TMs without erythema, bulging. No erythema, swelling, or exudate on post pharynx.  Tonsils not swollen or erythematous. Hearing normal.  Neck: Supple, thyroid normal.  Respiratory: Respiratory effort normal, BS equal bilaterally without rales, rhonchi, wheezing or stridor.  Cardio: Irreg irreg with no MRGs. Brisk peripheral pulses with 1-2+ edema.  Abdomen: Soft, + BS.  Non tender, no guarding, rebound, hernias, masses. Lymphatics: Non tender without lymphadenopathy.  Musculoskeletal: Full ROM, 5/5 strength, normal gait.  Skin: Warm, dry without rashes, lesions, ecchymosis.  Neuro: Cranial nerves intact. Normal muscle tone, no cerebellar symptoms. Sensation intact.  Psych: Awake and oriented X 3, normal affect, Insight and Judgment appropriate.     Vicie Mutters, PA-C 4:28 PM [facility]

## 2015-01-21 NOTE — Patient Instructions (Signed)

## 2015-02-08 ENCOUNTER — Other Ambulatory Visit: Payer: Self-pay | Admitting: Internal Medicine

## 2015-02-22 ENCOUNTER — Ambulatory Visit (INDEPENDENT_AMBULATORY_CARE_PROVIDER_SITE_OTHER): Payer: BLUE CROSS/BLUE SHIELD | Admitting: Cardiology

## 2015-02-22 ENCOUNTER — Encounter: Payer: Self-pay | Admitting: Cardiology

## 2015-02-22 VITALS — BP 140/80 | HR 94 | Ht 70.0 in | Wt 264.8 lb

## 2015-02-22 DIAGNOSIS — I481 Persistent atrial fibrillation: Secondary | ICD-10-CM | POA: Diagnosis not present

## 2015-02-22 DIAGNOSIS — Z01812 Encounter for preprocedural laboratory examination: Secondary | ICD-10-CM

## 2015-02-22 DIAGNOSIS — I10 Essential (primary) hypertension: Secondary | ICD-10-CM

## 2015-02-22 DIAGNOSIS — I4819 Other persistent atrial fibrillation: Secondary | ICD-10-CM

## 2015-02-22 NOTE — Progress Notes (Signed)
Cardiology Office Note   Date:  02/22/2015   ID:  KNOAH NEDEAU, DOB Feb 07, 1951, MRN [id number]  PCP:  Alesia Richards, MD    Chief Complaint  Patient presents with  . PAF      History of Present Illness: Christopher Wade is a 64 y.o. male who presents for evaluation of new onset atrial fibrillation.  He was found to be in afib in January by his PCP and was started on diltiazem in addition to the Atenolol he was already on and Xarelto. His CCB was increased due to poorly controlled rate but it wore him out and he stopped it.   He has a history of bradycardia on higher doses of AV nodal drugs in the past.   The fatigue improved after stopping the CCB.  He has a history of HTN, dyslipidemia and DM.  He also had a history of OSA and is on CPAP.  He denies any chest pain, SOB, DOE, dizziness or syncope.  He rarely will have some LE edema due to an ankle injury.  He denies palpitations.      Past Medical History  Diagnosis Date  . Hypertension   . Hyperlipidemia   . Diabetes mellitus without complication   . Thyroid disease   . Vitamin D deficiency   . Allergy   . Arthritis     lumbar    Past Surgical History  Procedure Laterality Date  . Joint replacement Bilateral 2001    Hips     Current Outpatient Prescriptions  Medication Sig Dispense Refill  . allopurinol (ZYLOPRIM) 300 MG tablet take 1/2 tablet by mouth daily for FIRST 2 WEEKS, THEN MAY INCREASE to 1 tablet daily 30 tablet 2  . amLODipine (NORVASC) 5 MG tablet take 1 tablet by mouth twice a day 60 tablet 3  . atenolol (TENORMIN) 100 MG tablet take 1 tablet by mouth once daily 90 tablet 1  . cholecalciferol (VITAMIN D) 1000 UNITS tablet Take 1,000 Units by mouth daily.    . colchicine 0.6 MG tablet take 1 tablet by mouth twice a day 60 tablet 2  . HYDROcodone-acetaminophen (NORCO/VICODIN) 5-325 MG per tablet Take 1 tablet by mouth every 6 (six) hours as needed for moderate pain. 90 tablet 0  .  levothyroxine (SYNTHROID, LEVOTHROID) 50 MCG tablet take 1 tablet by mouth once daily 90 tablet 0  . Magnesium 250 MG TABS Take 500 mg by mouth.     . metFORMIN (GLUCOPHAGE) 500 MG tablet take 1 tablet by mouth twice a day with food 60 tablet 3  . nystatin cream (MYCOSTATIN) Apply 1 application topically 2 (two) times daily. 30 g 1  . Omega-3 Fatty Acids (FISH OIL PO) Take 1,200 mg by mouth daily.    . rivaroxaban (XARELTO) 20 MG TABS tablet Take 1 tablet (20 mg total) by mouth daily with supper. 30 tablet 3  . valsartan (DIOVAN) 320 MG tablet take 1 tablet by mouth once daily 30 tablet 3   No current facility-administered medications for this visit.    Allergies:   Review of patient's allergies indicates no known allergies.    Social History:  The patient  reports that he has never smoked. He has never used smokeless tobacco. He reports that he does not drink alcohol or use illicit drugs.   Family History:  The patient's family history includes Cancer (age of onset: 62) in  his father; Cancer (age of onset: 31) in his mother; Cancer (age of onset: 17) in his sister.    ROS:  Please see the history of present illness.   Otherwise, review of systems are positive for none.   All other systems are reviewed and negative.    PHYSICAL EXAM: VS:  BP 140/80 mmHg  Pulse 94  Ht 5\' 10"  (1.778 m)  Wt 264 lb 12.8 oz (120.112 kg)  BMI 37.99 kg/m2 , BMI Body mass index is 37.99 kg/(m^2). GEN: Well nourished, well developed, in no acute distress HEENT: normal Neck: no JVD, carotid bruits, or masses Cardiac: RRR; no murmurs, rubs, or gallops,no edema  Respiratory:  clear to auscultation bilaterally, normal work of breathing GI: soft, nontender, nondistended, + BS MS: no deformity or atrophy Skin: warm and dry, no rash Neuro:  Strength and sensation are intact Psych: euthymic mood, full affect   EKG:  EKG is ordered today. The ekg ordered today demonstrates atrial fibrillation with PVC's vs.  Aberration.  Cannot rule out inferior infarct.     Recent Labs: 11/12/2014: ALT 36; Hemoglobin 15.7; Magnesium 1.9; Platelets 204; TSH 3.061 12/17/2014: BUN 23; Creat 1.22; Potassium 4.0; Sodium 144    Lipid Panel    Component Value Date/Time   CHOL 165 11/12/2014 1712   TRIG 170* 11/12/2014 1712   HDL 41 11/12/2014 1712   CHOLHDL 4.0 11/12/2014 1712   VLDL 34 11/12/2014 1712   LDLCALC 90 11/12/2014 1712      Wt Readings from Last 3 Encounters:  02/22/15 264 lb 12.8 oz (120.112 kg)  01/21/15 266 lb (120.657 kg)  12/17/14 262 lb (118.842 kg)        ASSESSMENT AND PLAN:  1.  New onset atrial fibrillation since June and has been on Xarelto for a CHADS2VASC score of 2. He is rate controlled on BB and CCB.  I will get a 2D echo to assess LVF and LA size.  He missed 3 doses about 1 week ago because he ran out.  He will need 4 more weeks of anticoagulation prior to DCCV.  TSH was normal in June.  2.  HTN - controlled on atenolol and valsartan 3.  DM   Current medicines are reviewed at length with the patient today.  The patient does not have concerns regarding medicines.  The following changes have been made:  no change  Labs/ tests ordered today: See above Assessment and Plan No orders of the defined types were placed in this encounter.     Disposition:   FU with me post DCCV   Signed, [name], MD  02/22/2015 4:22 PM    [facility] [address], [place], [place]  [address] Phone: [phone]; Fax: [phone]

## 2015-02-22 NOTE — Patient Instructions (Addendum)
Medication Instructions:  Your physician recommends that you continue on your current medications as directed. Please refer to the Current Medication list given to you today.   Labwork: Your physician recommends that you return for lab work on Monday, April 01, 2015 for pre-procedure labs. You may come ANY TIME BETWEEN 7:30AM and 5:00PM.  Testing/Procedures: Your physician has requested that you have an echocardiogram. Echocardiography is a painless test that uses sound waves to create images of your heart. It provides your doctor with information about the size and shape of your heart and how well your heart's chambers and valves are working. This procedure takes approximately one hour. There are no restrictions for this procedure.  Your physician has recommended that you have a Cardioversion (DCCV) on Friday, April 05, 2015. Electrical Cardioversion uses a jolt of electricity to your heart either through paddles or wired patches attached to your chest. This is a controlled, usually prescheduled, procedure. Defibrillation is done under light anesthesia in the hospital, and you usually go home the day of the procedure. This is done to get your heart back into a normal rhythm. You are not awake for the procedure. Please see the instruction sheet given to you today.  Follow-Up: You have a follow-up appointment scheduled with [name] Friday, April 19, 2015 at 3:00PM.  Any Other Special Instructions Will Be Listed Below (If Applicable). DO NOT FORGET TO PICK UP YOUR INSTRUCTION LETTER WHEN YOU COME TO GET YOUR LAB WORK DONE ON 11/7.

## 2015-02-28 ENCOUNTER — Encounter: Payer: Self-pay | Admitting: Cardiology

## 2015-03-06 ENCOUNTER — Other Ambulatory Visit: Payer: Self-pay | Admitting: Cardiology

## 2015-03-06 ENCOUNTER — Telehealth: Payer: Self-pay

## 2015-03-06 DIAGNOSIS — I4819 Other persistent atrial fibrillation: Secondary | ICD-10-CM

## 2015-03-06 NOTE — Telephone Encounter (Signed)
Scheduled DCCV 04/05/15 at noon with [name]. Patient to pick up instruction letter when he has pre-procedure labs 11/7 as discussed in clinic. Left message for patient as reminder. Instruction letter placed at front desk.

## 2015-03-27 ENCOUNTER — Encounter: Payer: Self-pay | Admitting: Internal Medicine

## 2015-04-01 ENCOUNTER — Other Ambulatory Visit (HOSPITAL_COMMUNITY): Payer: BLUE CROSS/BLUE SHIELD

## 2015-04-01 ENCOUNTER — Other Ambulatory Visit: Payer: Self-pay

## 2015-04-01 ENCOUNTER — Ambulatory Visit (HOSPITAL_COMMUNITY): Payer: BLUE CROSS/BLUE SHIELD | Attending: Cardiology

## 2015-04-01 ENCOUNTER — Other Ambulatory Visit (INDEPENDENT_AMBULATORY_CARE_PROVIDER_SITE_OTHER): Payer: BLUE CROSS/BLUE SHIELD | Admitting: *Deleted

## 2015-04-01 DIAGNOSIS — I517 Cardiomegaly: Secondary | ICD-10-CM | POA: Insufficient documentation

## 2015-04-01 DIAGNOSIS — I4819 Other persistent atrial fibrillation: Secondary | ICD-10-CM

## 2015-04-01 DIAGNOSIS — I10 Essential (primary) hypertension: Secondary | ICD-10-CM | POA: Insufficient documentation

## 2015-04-01 DIAGNOSIS — I340 Nonrheumatic mitral (valve) insufficiency: Secondary | ICD-10-CM | POA: Insufficient documentation

## 2015-04-01 DIAGNOSIS — I481 Persistent atrial fibrillation: Secondary | ICD-10-CM | POA: Insufficient documentation

## 2015-04-01 DIAGNOSIS — E119 Type 2 diabetes mellitus without complications: Secondary | ICD-10-CM | POA: Insufficient documentation

## 2015-04-01 DIAGNOSIS — Z01812 Encounter for preprocedural laboratory examination: Secondary | ICD-10-CM

## 2015-04-01 DIAGNOSIS — E785 Hyperlipidemia, unspecified: Secondary | ICD-10-CM | POA: Insufficient documentation

## 2015-04-01 DIAGNOSIS — I272 Other secondary pulmonary hypertension: Secondary | ICD-10-CM | POA: Insufficient documentation

## 2015-04-01 LAB — CBC WITH DIFFERENTIAL/PLATELET
BASOS ABS: 0 10*3/uL (ref 0.0–0.1)
BASOS PCT: 0 % (ref 0–1)
EOS ABS: 0.1 10*3/uL (ref 0.0–0.7)
EOS PCT: 1 % (ref 0–5)
HCT: 45.8 % (ref 39.0–52.0)
Hemoglobin: 16 g/dL (ref 13.0–17.0)
Lymphocytes Relative: 26 % (ref 12–46)
Lymphs Abs: 2.2 10*3/uL (ref 0.7–4.0)
MCH: 34.3 pg — AB (ref 26.0–34.0)
MCHC: 34.9 g/dL (ref 30.0–36.0)
MCV: 98.3 fL (ref 78.0–100.0)
MPV: 11 fL (ref 8.6–12.4)
Monocytes Absolute: 0.6 10*3/uL (ref 0.1–1.0)
Monocytes Relative: 7 % (ref 3–12)
Neutro Abs: 5.5 10*3/uL (ref 1.7–7.7)
Neutrophils Relative %: 66 % (ref 43–77)
PLATELETS: 234 10*3/uL (ref 150–400)
RBC: 4.66 MIL/uL (ref 4.22–5.81)
RDW: 15 % (ref 11.5–15.5)
WBC: 8.4 10*3/uL (ref 4.0–10.5)

## 2015-04-01 LAB — BASIC METABOLIC PANEL
BUN: 17 mg/dL (ref 7–25)
CALCIUM: 9 mg/dL (ref 8.6–10.3)
CHLORIDE: 100 mmol/L (ref 98–110)
CO2: 23 mmol/L (ref 20–31)
CREATININE: 1.16 mg/dL (ref 0.70–1.25)
Glucose, Bld: 162 mg/dL — ABNORMAL HIGH (ref 65–99)
Potassium: 3.8 mmol/L (ref 3.5–5.3)
SODIUM: 139 mmol/L (ref 135–146)

## 2015-04-02 LAB — APTT: APTT: 38 s — AB (ref 24–37)

## 2015-04-02 LAB — PROTIME-INR
INR: 1.08 (ref <1.50)
PROTHROMBIN TIME: 14.1 s (ref 11.6–15.2)

## 2015-04-05 ENCOUNTER — Ambulatory Visit (HOSPITAL_COMMUNITY): Payer: BLUE CROSS/BLUE SHIELD | Admitting: Anesthesiology

## 2015-04-05 ENCOUNTER — Encounter (HOSPITAL_COMMUNITY): Payer: Self-pay

## 2015-04-05 ENCOUNTER — Encounter (HOSPITAL_COMMUNITY)
Admission: RE | Disposition: A | Discharge status: Home / Self Care | Payer: Self-pay | Source: Ambulatory Visit | Attending: Cardiology

## 2015-04-05 ENCOUNTER — Ambulatory Visit (HOSPITAL_COMMUNITY)
Admission: RE | Admit: 2015-04-05 | Discharge: 2015-04-05 | Disposition: A | Discharge status: Home / Self Care | Payer: BLUE CROSS/BLUE SHIELD | Source: Ambulatory Visit | Attending: Cardiology | Admitting: Cardiology

## 2015-04-05 DIAGNOSIS — Z79891 Long term (current) use of opiate analgesic: Secondary | ICD-10-CM | POA: Insufficient documentation

## 2015-04-05 DIAGNOSIS — Z7984 Long term (current) use of oral hypoglycemic drugs: Secondary | ICD-10-CM | POA: Insufficient documentation

## 2015-04-05 DIAGNOSIS — E119 Type 2 diabetes mellitus without complications: Secondary | ICD-10-CM | POA: Insufficient documentation

## 2015-04-05 DIAGNOSIS — I481 Persistent atrial fibrillation: Secondary | ICD-10-CM

## 2015-04-05 DIAGNOSIS — I4891 Unspecified atrial fibrillation: Secondary | ICD-10-CM | POA: Diagnosis present

## 2015-04-05 DIAGNOSIS — Z79899 Other long term (current) drug therapy: Secondary | ICD-10-CM | POA: Insufficient documentation

## 2015-04-05 DIAGNOSIS — I10 Essential (primary) hypertension: Secondary | ICD-10-CM | POA: Diagnosis not present

## 2015-04-05 DIAGNOSIS — B353 Tinea pedis: Secondary | ICD-10-CM

## 2015-04-05 DIAGNOSIS — E559 Vitamin D deficiency, unspecified: Secondary | ICD-10-CM | POA: Insufficient documentation

## 2015-04-05 DIAGNOSIS — M4696 Unspecified inflammatory spondylopathy, lumbar region: Secondary | ICD-10-CM | POA: Insufficient documentation

## 2015-04-05 DIAGNOSIS — I480 Paroxysmal atrial fibrillation: Secondary | ICD-10-CM | POA: Insufficient documentation

## 2015-04-05 DIAGNOSIS — Z7901 Long term (current) use of anticoagulants: Secondary | ICD-10-CM | POA: Insufficient documentation

## 2015-04-05 DIAGNOSIS — E785 Hyperlipidemia, unspecified: Secondary | ICD-10-CM | POA: Insufficient documentation

## 2015-04-05 DIAGNOSIS — E079 Disorder of thyroid, unspecified: Secondary | ICD-10-CM | POA: Insufficient documentation

## 2015-04-05 HISTORY — PX: CARDIOVERSION: SHX1299

## 2015-04-05 LAB — GLUCOSE, CAPILLARY: GLUCOSE-CAPILLARY: 111 mg/dL — AB (ref 65–99)

## 2015-04-05 SURGERY — CARDIOVERSION
Anesthesia: Monitor Anesthesia Care

## 2015-04-05 MED ORDER — PROPOFOL 10 MG/ML IV BOLUS
INTRAVENOUS | Status: DC | PRN
Start: 2015-04-05 — End: 2015-04-05
  Administered 2015-04-05: 100 mg via INTRAVENOUS

## 2015-04-05 MED ORDER — LIDOCAINE HCL (CARDIAC) 20 MG/ML IV SOLN
INTRAVENOUS | Status: DC | PRN
  Administered 2015-04-05: 60 mg via INTRAVENOUS

## 2015-04-05 MED ORDER — SODIUM CHLORIDE 0.9 % IV SOLN
INTRAVENOUS | Status: DC | PRN
  Administered 2015-04-05: 12:00:00 via INTRAVENOUS

## 2015-04-05 MED ORDER — ATENOLOL 100 MG PO TABS: 50.0000 mg | ORAL_TABLET | Freq: Two times a day (BID) | ORAL | Status: DC

## 2015-04-05 MED ORDER — ALLOPURINOL 300 MG PO TABS: 300.0000 mg | ORAL_TABLET | Freq: Every day | ORAL | Status: DC

## 2015-04-05 MED ORDER — NYSTATIN 100000 UNIT/GM EX CREA: 1.0000 "application " | TOPICAL_CREAM | Freq: Two times a day (BID) | CUTANEOUS | Status: DC | PRN

## 2015-04-05 NOTE — H&P (Signed)
Cardiology Office Note   Date: 04/05/2015   ID: Christopher Wade, DOB [birth year], MRN [id number]  PCP: Alesia Richards, MD   Chief Complaint  Patient presents with  . PAF     History of Present Illness: Christopher Wade is a 64 y.o. male who presents for followup of new onset atrial fibrillation. He was found to be in afib in January by his PCP and was started on diltiazem in addition to the Atenolol he was already on and Xarelto. His CCB was increased due to poorly controlled rate but it wore him out and he stopped it. He has a history of bradycardia on higher doses of AV nodal drugs in the past. The fatigue improved after stopping the CCB. He has a history of HTN, dyslipidemia and DM. He also had a history of OSA and is on CPAP. He denies any chest pain, SOB, DOE, dizziness or syncope. He rarely will have some LE edema due to an ankle injury. He denies palpitations.     Past Medical History  Diagnosis Date  . Hypertension   . Hyperlipidemia   . Diabetes mellitus without complication   . Thyroid disease   . Vitamin D deficiency   . Allergy   . Arthritis     lumbar    Past Surgical History  Procedure Laterality Date  . Joint replacement Bilateral 2001    Hips     Current Outpatient Prescriptions  Medication Sig Dispense Refill  . allopurinol (ZYLOPRIM) 300 MG tablet take 1/2 tablet by mouth daily for FIRST 2 WEEKS, THEN MAY INCREASE to 1 tablet daily 30 tablet 2  . amLODipine (NORVASC) 5 MG tablet take 1 tablet by mouth twice a day 60 tablet 3  . atenolol (TENORMIN) 100 MG tablet take 1 tablet by mouth once daily 90 tablet 1  . cholecalciferol (VITAMIN D) 1000 UNITS tablet Take 1,000 Units by mouth daily.    . colchicine  0.6 MG tablet take 1 tablet by mouth twice a day 60 tablet 2  . HYDROcodone-acetaminophen (NORCO/VICODIN) 5-325 MG per tablet Take 1 tablet by mouth every 6 (six) hours as needed for moderate pain. 90 tablet 0  . levothyroxine (SYNTHROID, LEVOTHROID) 50 MCG tablet take 1 tablet by mouth once daily 90 tablet 0  . Magnesium 250 MG TABS Take 500 mg by mouth.     . metFORMIN (GLUCOPHAGE) 500 MG tablet take 1 tablet by mouth twice a day with food 60 tablet 3  . nystatin cream (MYCOSTATIN) Apply 1 application topically 2 (two) times daily. 30 g 1  . Omega-3 Fatty Acids (FISH OIL PO) Take 1,200 mg by mouth daily.    . rivaroxaban (XARELTO) 20 MG TABS tablet Take 1 tablet (20 mg total) by mouth daily with supper. 30 tablet 3  . valsartan (DIOVAN) 320 MG tablet take 1 tablet by mouth once daily 30 tablet 3   No current facility-administered medications for this visit.    Allergies: Review of patient's allergies indicates no known allergies.    Social History: The patient  reports that he has never smoked. He has never used smokeless tobacco. He reports that he does not drink alcohol or use illicit drugs.   Family History: The patient's family history includes Cancer (age of onset: 92) in his father; Cancer (age of onset: 84) in his mother; Cancer (age of onset: 45) in his sister.    ROS: Please see the history of present illness. Otherwise, review  of systems are positive for none. All other systems are reviewed and negative.    PHYSICAL EXAM: VS: BP 140/80 mmHg  Pulse 94  Ht 5\' 10"  (1.778 m)  Wt 264 lb 12.8 oz (120.112 kg)  BMI 37.99 kg/m2 , BMI Body mass index is 37.99 kg/(m^2). GEN: Well nourished, well developed, in no acute distress  HEENT: normal  Neck: no JVD, carotid bruits, or masses Cardiac: RRR; no murmurs, rubs, or gallops,no edema  Respiratory: clear to auscultation bilaterally, normal work of breathing GI: soft,  nontender, nondistended, + BS MS: no deformity or atrophy  Skin: warm and dry, no rash Neuro: Strength and sensation are intact Psych: euthymic mood, full affect   EKG: EKG is ordered today. The ekg ordered today demonstrates atrial fibrillation with PVC's vs. Aberration. Cannot rule out inferior infarct.    Recent Labs: 11/12/2014: ALT 36; Hemoglobin 15.7; Magnesium 1.9; Platelets 204; TSH 3.061 12/17/2014: BUN 23; Creat 1.22; Potassium 4.0; Sodium 144    Lipid Panel  Labs (Brief)       Component Value Date/Time   CHOL 165 11/12/2014 1712   TRIG 170* 11/12/2014 1712   HDL 41 11/12/2014 1712   CHOLHDL 4.0 11/12/2014 1712   VLDL 34 11/12/2014 1712   LDLCALC 90 11/12/2014 1712       Wt Readings from Last 3 Encounters:  02/22/15 264 lb 12.8 oz (120.112 kg)  01/21/15 266 lb (120.657 kg)  12/17/14 262 lb (118.842 kg)        ASSESSMENT AND PLAN:  1. New onset atrial fibrillation since June and has been on Xarelto for a CHADS2VASC score of 2. He is rate controlled on BB and CCB. I will get a 2D echo to assess LVF and LA size.He now presents for DCCV.  He has no missed any doses of Xarelto in the last 4 weeks. 2. HTN - controlled on atenolol and valsartan 3. DM   Current medicines are reviewed at length with the patient today. The patient does not have concerns regarding medicines.  The following changes have been made: no change  Labs/ tests ordered today: See above Assessment and Plan No orders of the defined types were placed in this encounter.    Disposition: FU with me post DCCV   Signed, [name], MD  [id number], [place], [place] [address] Phone: [phone]; Fax: [phone]

## 2015-04-05 NOTE — Discharge Instructions (Signed)
Electrical Cardioversion, Care After °Refer to this sheet in the next few weeks. These instructions provide you with information on caring for yourself after your procedure. Your health care provider may also give you more specific instructions. Your treatment has been planned according to current medical practices, but problems sometimes occur. Call your health care provider if you have any problems or questions after your procedure. °WHAT TO EXPECT AFTER THE PROCEDURE °After your procedure, it is typical to have the following sensations: °· Some redness on the skin where the shocks were delivered. If this is tender, a sunburn lotion or hydrocortisone cream may help. °· Possible return of an abnormal heart rhythm within hours or days after the procedure. °HOME CARE INSTRUCTIONS °· Take medicines only as directed by your health care provider. Be sure you understand how and when to take your medicine. °· Learn how to feel your pulse and check it often. °· Limit your activity for 48 hours after the procedure or as directed by your health care provider. °· Avoid or minimize caffeine and other stimulants as directed by your health care provider. °SEEK MEDICAL CARE IF: °· You feel like your heart is beating too fast or your pulse is not regular. °· You have any questions about your medicines. °· You have bleeding that will not stop. °SEEK IMMEDIATE MEDICAL CARE IF: °· You are dizzy or feel faint. °· It is hard to breathe or you feel short of breath. °· There is a change in discomfort in your chest. °· Your speech is slurred or you have trouble moving an arm or leg on one side of your body. °· You get a serious muscle cramp that does not go away. °· Your fingers or toes turn cold or blue. °  °This information is not intended to replace advice given to you by your health care provider. Make sure you discuss any questions you have with your health care provider. °  °Document Released: 03/01/2013 Document Revised: 06/01/2014  Document Reviewed: 03/01/2013 °Elsevier Interactive Patient Education ©2016 [employer] ° °

## 2015-04-05 NOTE — Transfer of Care (Signed)
Immediate Anesthesia Transfer of Care Note  Patient: Christopher Wade  Procedure(s) Performed: Procedure(s): CARDIOVERSION (N/A)  Patient Location: Endoscopy Unit  Anesthesia Type:MAC  Level of Consciousness: awake, alert  and oriented  Airway & Oxygen Therapy: Patient Spontanous Breathing and Patient connected to nasal cannula oxygen  Post-op Assessment: Report given to RN and Post -op Vital signs reviewed and stable  Post vital signs: Reviewed and stable  Last Vitals:  Filed Vitals:   04/05/15 1108  BP: 192/90  Pulse: 90  Resp: 18    Complications: No apparent anesthesia complications

## 2015-04-05 NOTE — Anesthesia Preprocedure Evaluation (Addendum)
Anesthesia Evaluation  Patient identified by MRN, date of birth, ID band Patient awake    Reviewed: Allergy & Precautions, NPO status , Patient's Chart, lab work & pertinent test results  Airway Mallampati: II  TM Distance: >3 FB Neck ROM: Full    Dental no notable dental hx. (+) Teeth Intact, Dental Advisory Given   Pulmonary neg pulmonary ROS,    Pulmonary exam normal breath sounds clear to auscultation       Cardiovascular hypertension, Pt. on medications and Pt. on home beta blockers + dysrhythmias Atrial Fibrillation  Rhythm:Irregular Rate:Normal     Neuro/Psych negative neurological ROS  negative psych ROS   GI/Hepatic negative GI ROS, Neg liver ROS,   Endo/Other  diabetes, Type 2, Oral Hypoglycemic Agentsobesity  Renal/GU negative Renal ROS  negative genitourinary   Musculoskeletal negative musculoskeletal ROS (+) Arthritis ,   Abdominal   Peds negative pediatric ROS (+)  Hematology negative hematology ROS (+)   Anesthesia Other Findings   Reproductive/Obstetrics negative OB ROS                            Anesthesia Physical Anesthesia Plan  ASA: III  Anesthesia Plan: MAC   Post-op Pain Management:    Induction: Intravenous  Airway Management Planned: Mask and Natural Airway  Additional Equipment:   Intra-op Plan:   Post-operative Plan:   Informed Consent: I have reviewed the patients History and Physical, chart, labs and discussed the procedure including the risks, benefits and alternatives for the proposed anesthesia with the patient or authorized representative who has indicated his/her understanding and acceptance.   Dental advisory given  Plan Discussed with: Anesthesiologist, Surgeon and CRNA  Anesthesia Plan Comments:        Anesthesia Quick Evaluation

## 2015-04-05 NOTE — Anesthesia Postprocedure Evaluation (Signed)
  Anesthesia Post-op Note  Patient: Christopher Wade  Procedure(s) Performed: Procedure(s): CARDIOVERSION (N/A)  Patient Location: Endoscopy Unit  Anesthesia Type:MAC  Level of Consciousness: awake, alert  and oriented  Airway and Oxygen Therapy: Patient Spontanous Breathing and Patient connected to nasal cannula oxygen  Post-op Pain: none  Post-op Assessment: Post-op Vital signs reviewed, Patient's Cardiovascular Status Stable, Respiratory Function Stable, Patent Airway, No signs of Nausea or vomiting and Pain level controlled              Post-op Vital Signs: Reviewed and stable  Last Vitals:  Filed Vitals:   04/05/15 1108  BP: 192/90  Pulse: 90  Resp: 18    Complications: No apparent anesthesia complications

## 2015-04-05 NOTE — CV Procedure (Signed)
   Electrical Cardioversion Procedure Note Christopher Wade [id number] Feb 24, 1951  Procedure: Electrical Cardioversion Indications:  Atrial Fibrillation  Time Out: Verified patient identification, verified procedure,medications/allergies/relevent history reviewed, required imaging and test results available.  Performed  Procedure Details  The patient was NPO after midnight. Anesthesia was administered at the beside  by Dr.Rose with 100mg  of propofol and 60mg  of Lidocaine.  Cardioversion was done with synchronized biphasic defibrillation with AP pads with 150 watts.  The patient converted to normal sinus rhythm. The patient shortly after cardioversion converted back to atrial fibrillation. Cardioversion was again done with synchronized biphasic defibrillation with AP pads with 150 watts.  The patient converted to normal sinus rhythm. The patient tolerated the procedure well   IMPRESSION:  Successful cardioversion of atrial fibrillation    TURNER,TRACI R 04/05/2015, 11:12 AM

## 2015-04-06 ENCOUNTER — Other Ambulatory Visit: Payer: Self-pay | Admitting: Physician Assistant

## 2015-04-08 ENCOUNTER — Encounter (HOSPITAL_COMMUNITY): Payer: Self-pay | Admitting: Cardiology

## 2015-04-19 ENCOUNTER — Ambulatory Visit: Payer: BLUE CROSS/BLUE SHIELD | Admitting: Cardiology

## 2015-05-03 ENCOUNTER — Other Ambulatory Visit: Payer: Self-pay | Admitting: Internal Medicine

## 2015-05-09 ENCOUNTER — Ambulatory Visit (INDEPENDENT_AMBULATORY_CARE_PROVIDER_SITE_OTHER): Payer: Medicare Other | Admitting: Cardiology

## 2015-05-09 ENCOUNTER — Encounter: Payer: Self-pay | Admitting: Cardiology

## 2015-05-09 VITALS — BP 148/84 | HR 81 | Ht 70.0 in | Wt 277.6 lb

## 2015-05-09 DIAGNOSIS — E669 Obesity, unspecified: Secondary | ICD-10-CM

## 2015-05-09 DIAGNOSIS — G4733 Obstructive sleep apnea (adult) (pediatric): Secondary | ICD-10-CM | POA: Insufficient documentation

## 2015-05-09 DIAGNOSIS — I10 Essential (primary) hypertension: Secondary | ICD-10-CM | POA: Diagnosis not present

## 2015-05-09 DIAGNOSIS — I480 Paroxysmal atrial fibrillation: Secondary | ICD-10-CM | POA: Diagnosis not present

## 2015-05-09 NOTE — Patient Instructions (Addendum)
Medication Instructions:  Your physician recommends that you continue on your current medications as directed. Please refer to the Current Medication list given to you today.   Labwork: None  Testing/Procedures: None  Follow-Up:  You have been referred to Roderic Palau in the [facility].   Your physician wants you to follow-up in: 6 months with [name]. You will receive a reminder letter in the mail two months in advance. If you don't receive a letter, please call our office to schedule the follow-up appointment.   Any Other Special Instructions Will Be Listed Below (If Applicable).     If you need a refill on your cardiac medications before your next appointment, please call your pharmacy.

## 2015-05-09 NOTE — Progress Notes (Signed)
Cardiology Office Note   Date:  05/09/2015   ID:  JONATHANMICHAEL PASOS, DOB Jan 23, 1951, MRN [id number]  PCP:  Alesia Richards, MD    Chief Complaint  Patient presents with  . Atrial Fibrillation  . Hypertension      History of Present Illness: Christopher Wade is a 64 y.o. male who presents for followup of atrial fibrillation. He was found to be in afib in January by his PCP and was started on diltiazem in addition to the Atenolol and Xarelto. His CCB was increased due to poorly controlled rate but it wore him out and he stopped it. He has a history of bradycardia on higher doses of AV nodal drugs in the past. The fatigue improved after stopping the CCB.He underwent DCCV 03/2015 to NSR.   He has a history of HTN, dyslipidemia and DM. He also had a history of OSA and is on CPAP.  He denies any chest pain, SOB, DOE (except with extreme exertion), dizziness, palpitations or syncope. He occasionally will have some LE edema due to an ankle injury. He denies palpitations.     Past Medical History  Diagnosis Date  . Hypertension   . Hyperlipidemia   . Diabetes mellitus without complication ([place])   . Thyroid disease   . Vitamin D deficiency   . Allergy   . Arthritis     lumbar  . Atrial fibrillation, persistent ([place])     s/p DCCV 03/2015 with reversion back to atrial fibrillation  . OSA (obstructive sleep apnea)     noncompliant with CPAP    Past Surgical History  Procedure Laterality Date  . Joint replacement Bilateral 2001    Hips  . Cardioversion N/A 04/05/2015    Procedure: CARDIOVERSION;  Surgeon: [name], MD;  Location: [facility];  Service: Cardiovascular;  Laterality: N/A;     Current Outpatient Prescriptions  Medication Sig Dispense Refill  . allopurinol (ZYLOPRIM) 300 MG tablet Take 1 tablet (300 mg total) by mouth daily. 30 tablet 2  . amLODipine (NORVASC) 5 MG tablet take 1 tablet by mouth twice a day 60 tablet 3  . atenolol  (TENORMIN) 100 MG tablet Take 0.5 tablets (50 mg total) by mouth 2 (two) times daily. 90 tablet 1  . cholecalciferol (VITAMIN D) 1000 UNITS tablet Take 1,000 Units by mouth daily.    . colchicine 0.6 MG tablet take 1 tablet by mouth twice a day 60 tablet 2  . HYDROcodone-acetaminophen (NORCO/VICODIN) 5-325 MG per tablet Take 1 tablet by mouth every 6 (six) hours as needed for moderate pain. 90 tablet 0  . levothyroxine (SYNTHROID, LEVOTHROID) 50 MCG tablet take 1 tablet by mouth once daily 90 tablet 0  . Magnesium 250 MG TABS Take 250 mg by mouth 2 (two) times daily.     . metFORMIN (GLUCOPHAGE) 500 MG tablet take 1 tablet by mouth twice a day with food 60 tablet 3  . nystatin cream (MYCOSTATIN) Apply 1 application topically 2 (two) times daily as needed (wound care). 30 g 1  . Omega-3 Fatty Acids (FISH OIL) 1200 MG CAPS Take 1,200 mg by mouth daily.    . valsartan (DIOVAN) 320 MG tablet take 1 tablet by mouth once daily 30 tablet 3  . XARELTO 20 MG TABS tablet take 1 tablet by mouth once daily 90 tablet 1   No current facility-administered medications for this visit.  Allergies:   Bee venom    Social History:  The patient  reports that he has never smoked. He has never used smokeless tobacco. He reports that he does not drink alcohol or use illicit drugs.   Family History:  The patient's family history includes Cancer (age of onset: 28) in his father; Cancer (age of onset: 100) in his mother; Cancer (age of onset: 45) in his sister.    ROS:  Please see the history of present illness.   Otherwise, review of systems are positive for none.   All other systems are reviewed and negative.    PHYSICAL EXAM: VS:  BP 148/84 mmHg  Pulse 81  Ht 5\' 10"  (1.778 m)  Wt 277 lb 9.6 oz (125.919 kg)  BMI 39.83 kg/m2  SpO2 93% , BMI Body mass index is 39.83 kg/(m^2). GEN: Well nourished, well developed, in no acute distress HEENT: normal Neck: no JVD, carotid bruits, or masses Cardiac: irregularly  irregular; no murmurs, rubs, or gallops,no edema  Respiratory:  clear to auscultation bilaterally, normal work of breathing GI: soft, nontender, nondistended, + BS MS: no deformity or atrophy Skin: warm and dry, no rash Neuro:  Strength and sensation are intact Psych: euthymic mood, full affect   EKG:  EKG was ordered today and showed atrial fibrillation with CVR    Recent Labs: 11/12/2014: ALT 36; Magnesium 1.9; TSH 3.061 04/01/2015: BUN 17; Creat 1.16; Hemoglobin 16.0; Platelets 234; Potassium 3.8; Sodium 139    Lipid Panel    Component Value Date/Time   CHOL 165 11/12/2014 1712   TRIG 170* 11/12/2014 1712   HDL 41 11/12/2014 1712   CHOLHDL 4.0 11/12/2014 1712   VLDL 34 11/12/2014 1712   LDLCALC 90 11/12/2014 1712      Wt Readings from Last 3 Encounters:  05/09/15 277 lb 9.6 oz (125.919 kg)  04/05/15 264 lb (119.75 kg)  02/22/15 264 lb 12.8 oz (120.112 kg)    ASSESSMENT AND PLAN:  1. New onset atrial fibrillation since June now s/p DDCV to NSR.  He is now back in atrial fibrillation with CVR. [name]  Continue Xarelto for a CHADS2VASC score of 2.  I am going to refer him to afib clinic for further evaluation. 2. HTN - borderline controlled on atenolol and valsartan 3. DM 4.  Obesity  5.  OSA on CPAP but not very compliant.  I have explained the interaction of OSA with development of atrial fibrillation and that if he is not compliant with his CPAP that he may have more PAF.  I have encouraged him to use it nightly and I will get a d/l in 4 weeks.     Current medicines are reviewed at length with the patient today.  The patient does not have concerns regarding medicines.  The following changes have been made:  no change  Labs/ tests ordered today: See above Assessment and Plan No orders of the defined types were placed in this encounter.     Disposition:   FU with me in 6 months  Signed, [name], MD  05/09/2015 11:43 AM    [facility]  [facility] [address], [place], [place]  [address] Phone: [phone]; Fax: [phone]

## 2015-05-16 ENCOUNTER — Ambulatory Visit (INDEPENDENT_AMBULATORY_CARE_PROVIDER_SITE_OTHER): Payer: Medicare Other | Admitting: Physician Assistant

## 2015-05-16 ENCOUNTER — Encounter: Payer: Self-pay | Admitting: Physician Assistant

## 2015-05-16 VITALS — BP 120/90 | HR 104 | Temp 98.1°F | Resp 16 | Ht 70.0 in | Wt 275.0 lb

## 2015-05-16 DIAGNOSIS — I10 Essential (primary) hypertension: Secondary | ICD-10-CM | POA: Diagnosis not present

## 2015-05-16 DIAGNOSIS — N182 Chronic kidney disease, stage 2 (mild): Secondary | ICD-10-CM

## 2015-05-16 DIAGNOSIS — E079 Disorder of thyroid, unspecified: Secondary | ICD-10-CM | POA: Diagnosis not present

## 2015-05-16 DIAGNOSIS — Z0000 Encounter for general adult medical examination without abnormal findings: Secondary | ICD-10-CM

## 2015-05-16 DIAGNOSIS — E559 Vitamin D deficiency, unspecified: Secondary | ICD-10-CM | POA: Diagnosis not present

## 2015-05-16 DIAGNOSIS — B353 Tinea pedis: Secondary | ICD-10-CM | POA: Diagnosis not present

## 2015-05-16 DIAGNOSIS — B351 Tinea unguium: Secondary | ICD-10-CM | POA: Diagnosis not present

## 2015-05-16 DIAGNOSIS — E669 Obesity, unspecified: Secondary | ICD-10-CM

## 2015-05-16 DIAGNOSIS — I4819 Other persistent atrial fibrillation: Secondary | ICD-10-CM

## 2015-05-16 DIAGNOSIS — Z23 Encounter for immunization: Secondary | ICD-10-CM

## 2015-05-16 DIAGNOSIS — E1122 Type 2 diabetes mellitus with diabetic chronic kidney disease: Secondary | ICD-10-CM | POA: Diagnosis not present

## 2015-05-16 DIAGNOSIS — I481 Persistent atrial fibrillation: Secondary | ICD-10-CM

## 2015-05-16 DIAGNOSIS — E1129 Type 2 diabetes mellitus with other diabetic kidney complication: Secondary | ICD-10-CM | POA: Diagnosis not present

## 2015-05-16 DIAGNOSIS — Z0001 Encounter for general adult medical examination with abnormal findings: Secondary | ICD-10-CM | POA: Diagnosis not present

## 2015-05-16 DIAGNOSIS — R6889 Other general symptoms and signs: Secondary | ICD-10-CM | POA: Diagnosis not present

## 2015-05-16 DIAGNOSIS — N400 Enlarged prostate without lower urinary tract symptoms: Secondary | ICD-10-CM | POA: Diagnosis not present

## 2015-05-16 DIAGNOSIS — G4733 Obstructive sleep apnea (adult) (pediatric): Secondary | ICD-10-CM

## 2015-05-16 DIAGNOSIS — Z79899 Other long term (current) drug therapy: Secondary | ICD-10-CM | POA: Diagnosis not present

## 2015-05-16 DIAGNOSIS — E785 Hyperlipidemia, unspecified: Secondary | ICD-10-CM | POA: Diagnosis not present

## 2015-05-16 DIAGNOSIS — M109 Gout, unspecified: Secondary | ICD-10-CM

## 2015-05-16 LAB — HEMOGLOBIN A1C
HEMOGLOBIN A1C: 6.5 % — AB (ref <5.7)
Mean Plasma Glucose: 140 mg/dL — ABNORMAL HIGH (ref <117)

## 2015-05-16 LAB — LIPID PANEL
CHOL/HDL RATIO: 4.1 ratio (ref <=5.0)
Cholesterol: 175 mg/dL (ref 125–200)
HDL: 43 mg/dL (ref >=40)
LDL Cholesterol: 103 mg/dL (ref <130)
Triglycerides: 145 mg/dL (ref <150)
VLDL: 29 mg/dL (ref <30)

## 2015-05-16 LAB — CBC WITH DIFFERENTIAL/PLATELET
Basophils Absolute: 0.1 10*3/uL (ref 0.0–0.1)
Basophils Relative: 1 % (ref 0–1)
EOS PCT: 2 % (ref 0–5)
Eosinophils Absolute: 0.2 10*3/uL (ref 0.0–0.7)
HEMATOCRIT: 45.1 % (ref 39.0–52.0)
Hemoglobin: 15.8 g/dL (ref 13.0–17.0)
LYMPHS ABS: 2.2 10*3/uL (ref 0.7–4.0)
LYMPHS PCT: 27 % (ref 12–46)
MCH: 34.4 pg — ABNORMAL HIGH (ref 26.0–34.0)
MCHC: 35 g/dL (ref 30.0–36.0)
MCV: 98.3 fL (ref 78.0–100.0)
MONO ABS: 0.7 10*3/uL (ref 0.1–1.0)
MPV: 11 fL (ref 8.6–12.4)
Monocytes Relative: 9 % (ref 3–12)
Neutro Abs: 4.9 10*3/uL (ref 1.7–7.7)
Neutrophils Relative %: 61 % (ref 43–77)
Platelets: 222 10*3/uL (ref 150–400)
RBC: 4.59 MIL/uL (ref 4.22–5.81)
RDW: 15 % (ref 11.5–15.5)
WBC: 8.1 10*3/uL (ref 4.0–10.5)

## 2015-05-16 LAB — BASIC METABOLIC PANEL WITH GFR
BUN: 20 mg/dL (ref 7–25)
CHLORIDE: 103 mmol/L (ref 98–110)
CO2: 26 mmol/L (ref 20–31)
CREATININE: 0.98 mg/dL (ref 0.70–1.25)
Calcium: 9.4 mg/dL (ref 8.6–10.3)
GFR, EST NON AFRICAN AMERICAN: 81 mL/min (ref >=60)
GFR, Est African American: >89 mL/min (ref >=60)
Glucose, Bld: 104 mg/dL — ABNORMAL HIGH (ref 65–99)
POTASSIUM: 4 mmol/L (ref 3.5–5.3)
SODIUM: 140 mmol/L (ref 135–146)

## 2015-05-16 LAB — HEPATIC FUNCTION PANEL
ALK PHOS: 72 U/L (ref 40–115)
ALT: 41 U/L (ref 9–46)
AST: 33 U/L (ref 10–35)
Albumin: 4.2 g/dL (ref 3.6–5.1)
BILIRUBIN DIRECT: 0.2 mg/dL (ref <=0.2)
BILIRUBIN INDIRECT: 0.7 mg/dL (ref 0.2–1.2)
BILIRUBIN TOTAL: 0.9 mg/dL (ref 0.2–1.2)
Total Protein: 7 g/dL (ref 6.1–8.1)

## 2015-05-16 LAB — URIC ACID: URIC ACID, SERUM: 6.1 mg/dL (ref 4.0–7.8)

## 2015-05-16 LAB — MAGNESIUM: Magnesium: 1.7 mg/dL (ref 1.5–2.5)

## 2015-05-16 MED ORDER — HYDROCODONE-ACETAMINOPHEN 5-325 MG PO TABS: 1.0000 | ORAL_TABLET | Freq: Four times a day (QID) | ORAL | Status: DC | PRN

## 2015-05-16 MED ORDER — TERBINAFINE HCL 250 MG PO TABS: 250.0000 mg | ORAL_TABLET | Freq: Every day | ORAL | Status: AC

## 2015-05-16 NOTE — Progress Notes (Signed)
WELCOME TO MEDICARE VISIT  Assessment:   1. Essential hypertension - continue medications, DASH diet, exercise and monitor at home. Call if greater than 130/80.  - CBC with Differential/Platelet - BASIC METABOLIC PANEL WITH GFR - Hepatic function panel - Urinalysis, Routine w reflex microscopic (not at [facility]) - Microalbumin / creatinine urine ratio - EKG 12-Lead - [place], RETROPERITNL ABD,  LTD  2. Atrial fibrillation, persistent (HCC) Continue anticoagulation and cardio follow up.   3. Type 2 diabetes mellitus with stage 2 chronic kidney disease, without long-term current use of insulin ([place]) Discussed general issues about diabetes pathophysiology and management., Educational material distributed., Suggested low cholesterol diet., Encouraged aerobic exercise., Discussed foot care., Reminded to get yearly retinal exam. Continue ARB - Hemoglobin A1c  4. Obesity (BMI 30-39.9) Obesity with co morbidities- long discussion about weight loss, diet, and exercise  5. Hyperlipidemia -continue medications, check lipids, decrease fatty foods, increase activity.  - Lipid panel  6. Gout without tophus, unspecified cause, unspecified chronicity, unspecified site - Uric acid  7. OSA (obstructive sleep apnea) Emphasized need for compliance with CPAP to help prevent Afib Will try to wear Will contact [employer] to try to check on humified air.   8. Thyroid disease Hypothyroidism-check TSH level, continue medications the same, reminded to take on an empty stomach 30-90mins before food.  - TSH  9. Vitamin D deficiency  10. Medication management - Magnesium  11. Encounter for long-term (current) use of medications   12. Tinea pedis of both feet Continue cream, monitor  13. Onychomycosis Onychomycosis- has tried OTC meds, willing to try Lamisil, side effects discussed, will follow up in 6 weeks for LFTs, cheapest at [employer]  14. BPH without obstruction/lower urinary  tract symptoms - PSA  15. Encounter for Medicare annual wellness exam  Over 40 minutes of exam, counseling, chart review and critical decision making was performed  Plan:   During the course of the visit the patient was educated and counseled about appropriate screening and preventive services including:    Pneumococcal vaccine  Prevnar 13   Influenza vaccine  Td vaccine  Screening electrocardiogram  Bone densitometry screening  Colorectal cancer screening  Diabetes screening  Glaucoma screening  Nutrition counseling   Advanced directives: requested  Conditions/risks identified: BMI: Discussed weight loss, diet, and increase physical activity.  Increase physical activity: AHA recommends 150 minutes of physical activity a week.  Medications reviewed Diabetes is at goal, ACE/ARB therapy: Yes. Urinary Incontinence is not an issue: discussed non pharmacology and pharmacology options.  Fall risk: low- discussed PT, home fall assessment, medications.    Subjective:  Christopher Wade is a 64 y.o. male who presents for Welcome to medicare visit.    His blood pressure has been controlled at home, today their BP is BP: 120/90 mmHg He does not workout. He denies chest pain, shortness of breath, dizziness.  He was found to have Afib on 3 month OV in June, has since seen [name], had [place] 03/2015 but is now back in Afib, he however denies SOB, CP, palpiations. He has OSA is non compliant with CPAP, states that it is too dry, has humidifier, has through [facility].  He is on cholesterol medication and denies myalgias. His cholesterol is at goal. The cholesterol last visit was:   Lab Results  Component Value Date   CHOL 165 11/12/2014   HDL 41 11/12/2014   LDLCALC 90 11/12/2014   TRIG 170* 11/12/2014   CHOLHDL 4.0 11/12/2014  He has been working on diet and exercise for diabetes with CKd, he is not on bASA due to xarelto, he is on ACE/ARB, and denies paresthesia of the feet,  polydipsia, polyuria and visual disturbances. Last A1C in the office was:  Lab Results  Component Value Date   HGBA1C 6.6* 11/12/2014   Lab Results  Component Value Date   GFRNONAA 62 12/17/2014  Patient is on Vitamin D supplement.   Lab Results  Component Value Date   VD25OH 32 11/12/2014     He is on thyroid medication. His medication was not changed last visit.   Lab Results  Component Value Date   TSH 3.061 11/12/2014  .  Patient is on allopurinol for gout and does not report a recent flare.  Lab Results  Component Value Date   LABURIC 6.3 11/12/2014   BMI is Body mass index is 39.46 kg/(m^2)., he is working on diet and exercise. Wt Readings from Last 3 Encounters:  05/16/15 275 lb (124.739 kg)  05/09/15 277 lb 9.6 oz (125.919 kg)  04/05/15 264 lb (119.75 kg)      Medication Review: Current Outpatient Prescriptions on File Prior to Visit  Medication Sig Dispense Refill  . allopurinol (ZYLOPRIM) 300 MG tablet Take 1 tablet (300 mg total) by mouth daily. 30 tablet 2  . amLODipine (NORVASC) 5 MG tablet take 1 tablet by mouth twice a day 60 tablet 3  . atenolol (TENORMIN) 100 MG tablet Take 0.5 tablets (50 mg total) by mouth 2 (two) times daily. 90 tablet 1  . cholecalciferol (VITAMIN D) 1000 UNITS tablet Take 1,000 Units by mouth daily.    . colchicine 0.6 MG tablet take 1 tablet by mouth twice a day 60 tablet 2  . levothyroxine (SYNTHROID, LEVOTHROID) 50 MCG tablet take 1 tablet by mouth once daily 90 tablet 0  . Magnesium 250 MG TABS Take 250 mg by mouth 2 (two) times daily.     . metFORMIN (GLUCOPHAGE) 500 MG tablet take 1 tablet by mouth twice a day with food 60 tablet 3  . nystatin cream (MYCOSTATIN) Apply 1 application topically 2 (two) times daily as needed (wound care). 30 g 1  . Omega-3 Fatty Acids (FISH OIL) 1200 MG CAPS Take 1,200 mg by mouth daily.    [name] 20 MG TABS tablet take 1 tablet by mouth once daily 90 tablet 1   No current  facility-administered medications on file prior to visit.    Current Problems (verified) Patient Active Problem List   Diagnosis Date Noted  . Encounter for Medicare annual wellness exam 05/16/2015  . Atrial fibrillation, persistent ([place])   . OSA (obstructive sleep apnea)   . Gout 11/12/2014  . Essential hypertension 11/12/2014  . Medication management 11/12/2014  . Encounter for long-term (current) use of medications 07/31/2013  . Obesity (BMI 30-39.9) 07/31/2013  . Hyperlipidemia   . DM type 2 causing CKD stage 2 ([place])   . Thyroid disease   . Vitamin D deficiency     Screening Tests Immunization History  Administered Date(s) Administered  . Influenza Split 04/05/2014  . Influenza-Unspecified 02/02/2013  . Pneumococcal Conjugate-13 05/01/2014  . Pneumococcal Polysaccharide-23 05/16/2015  . Td 04/25/2010  . Varicella Zoster Immune Globulin 02/22/2014   Preventative care: Last colonoscopy: 2009, Dr. Carlean Purl Echo 03/2015, normal EF, mild LAE, moderate RV  Prior vaccinations: TD or Tdap: 2011 Influenza: DUE, out in the office will go get [facility] Pneumococcal: DUE Prevnar13: 2015 Shingles/Zostavax: 2015  Names  of Other Physician/Practitioners you currently use: 1. [facility] here for primary care 2. [facility], eye doctor, last visit 06/2013 last DEE, needs appointment 3.dentist, none  Patient Care Team: [name], MD as PCP - General (Internal Medicine) Gatha Mayer, MD as Consulting Physician (Gastroenterology) [name], MD as Consulting Physician (Cardiology)  Past Surgical History  Procedure Laterality Date  . Joint replacement Bilateral 2001    Hips  . Cardioversion N/A 04/05/2015    Procedure: CARDIOVERSION;  Surgeon: [name], MD;  Location: [facility];  Service: Cardiovascular;  Laterality: N/A;   Family History  Problem Relation Age of Onset  . Cancer Mother 21    Lung  . Cancer Father 11     lung  . Cancer Sister 45    colon   Social History  Substance Use Topics  . Smoking status: Never Smoker   . Smokeless tobacco: Never Used  . Alcohol Use: No    MEDICARE WELLNESS OBJECTIVES: Tobacco use: He does not smoke.  Patient is not a former smoker. If yes, counseling given Alcohol Current alcohol use: none Osteoporosis: dietary calcium and/or vitamin D deficiency, History of fracture in the past year: no Fall risk: Low Risk Hearing: normal Visual acuity: impaired,  does not perform annual eye exam Diet: in general, a "healthy" diet   Physical activity: Current Exercise Habits:: The patient does not participate in regular exercise at present Cardiac risk factors: Cardiac Risk Factors include: advanced age (>38men, >45 women);diabetes mellitus;dyslipidemia;family history of premature cardiovascular disease;hypertension;male gender;obesity (BMI >30kg/m2) Depression/mood screen:   Depression screen [facility] 2/9 05/16/2015  Decreased Interest 0  Down, Depressed, Hopeless 0  PHQ - 2 Score 0    ADLs:  In your present state of health, do you have any difficulty performing the following activities: 05/16/2015  Hearing? Y  Vision? Y  Difficulty concentrating or making decisions? N  Walking or climbing stairs? N  Dressing or bathing? N  Doing errands, shopping? N  Preparing Food and eating ? N  Using the Toilet? N  In the past six months, have you accidently leaked urine? N  Do you have problems with loss of bowel control? N  Managing your Medications? N  Managing your Finances? N  Housekeeping or managing your Housekeeping? N     Cognitive Testing  Alert? Yes  Normal Appearance?Yes  Oriented to person? Yes  Place? Yes   Time? Yes  Recall of three objects?  Yes  Can perform simple calculations? Yes  Displays appropriate judgment?Yes  Can read the correct time from a watch face?Yes  EOL planning: Does patient have an advance directive?: No Would patient like  information on creating an advanced directive?: Yes - Educational materials given  ROS   Objective:     Today's Vitals   05/16/15 1408  BP: 120/90  Pulse: 104  Temp: 98.1 F (36.7 C)  Resp: 16  Height: 5\' 10"  (1.778 m)  Weight: 275 lb (124.739 kg)  SpO2: 96%   Body mass index is 39.46 kg/(m^2).  General appearance: alert, no distress, WD/WN, male HEENT: normocephalic, sclerae anicteric, TMs pearly, nares patent, no discharge or erythema, pharynx normal Oral cavity: MMM, no lesions Neck: supple, no lymphadenopathy, no thyromegaly, no masses Heart: Irreg irreg, normal S1, S2, no murmurs Lungs: CTA bilaterally, no wheezes, rhonchi, or rales Abdomen: +bs, soft, obese, non tender, non distended, ventral hernia, no masses, no hepatomegaly, no splenomegaly Musculoskeletal: nontender, no swelling, no obvious  deformity Extremities: 1+ edema, no cyanosis, no clubbing Pulses: 2+ symmetric, upper and lower extremities, normal cap refill Neurological: alert, oriented x 3, CN2-12 intact, strength normal upper extremities and lower extremities, sensation normal throughout, DTRs 2+ throughout, no cerebellar signs, gait Antalgic Psychiatric: normal affect, behavior normal, pleasant   Diabetic Foot Exam - Simple   Simple Foot Form  Diabetic Foot exam was performed with the following findings:  Yes 05/16/2015  2:39 PM  Visual Inspection  See comments:  Yes  Sensation Testing  See comments:  Yes  Pulse Check  See comments:  Yes  Comments  DP reduced bilateral, reduced sensation at bilateral feet to shin, tinea pedis, onychomycosis and nail exam onychomycosis of the toenails and dystrophic nails      Medicare Attestation I have personally reviewed: The patient's medical and social history Their use of alcohol, tobacco or illicit drugs Their current medications and supplements The patient's functional ability including ADLs,fall risks, home safety risks, cognitive, and hearing and  visual impairment Diet and physical activities Evidence for depression or mood disorders  The patient's weight, height, BMI, and visual acuity have been recorded in the chart.  I have made referrals, counseling, and provided education to the patient based on review of the above and I have provided the patient with a written personalized care plan for preventive services.     Vicie Mutters, PA-C   05/16/2015

## 2015-05-16 NOTE — Patient Instructions (Addendum)
Dr. Carlean Purl, call about colonoscopy Phone: [phone];  Faythe Ghee I will send in lamisil for you to take. You can only get it from [place] or [employer] will not cover it.The lamisil is taken once a day for  months and then if can be taken for several months after for a month on it and month off it. It gets processed through you liver so we need to check your liver function at 6 weeks after taking the drug. It can take up to 6 months to a year for your toenails to get better.   Diabetes is a very complicated disease...lets simplify it.  An easy way to look at it to understand the complications is if you think of the extra sugar floating in your blood stream as glass shards floating through your blood stream.    Diabetes affects your small vessels first: 1) The glass shards (sugar) scraps down the tiny blood vessels in your eyes and lead to diabetic retinopathy, the leading cause of blindness in the [place]. Diabetes is the leading cause of newly diagnosed adult (48 to 65 years of age) blindness in the [place].  2) The glass shards scratches down the tiny vessels of your legs leading to nerve damage called neuropathy and can lead to amputations of your feet. More than 60% of all non-traumatic amputations of lower limbs occur in people with diabetes.  3) Over time the small vessels in your brain are shredded and closed off, individually this does not cause any problems but over a long period of time many of the small vessels being blocked can lead to Vascular Dementia.   4) Your kidney's are a filter system and have a "net" that keeps certain things in the body and lets bad things out. Sugar shreds this net and leads to kidney damage and eventually failure. Decreasing the sugar that is destroying the net and certain blood pressure medications can help stop or decrease progression of kidney disease. Diabetes was the primary cause of kidney failure in 44 percent of all new cases in  2011.  5) Diabetes also destroys the small vessels in your penis that lead to erectile dysfunction. Eventually the vessels are so damaged that you may not be responsive to cialis or viagra.   Diabetes and your large vessels: Your larger vessels consist of your coronary arteries in your heart and the carotid vessels to your brain. Diabetes or even increased sugars put you at 300% increased risk of heart attack and stroke and this is why.. The sugar scrapes down your large blood vessels and your body sees this as an internal injury and tries to repair itself. Just like you get a scab on your skin, your platelets will stick to the blood vessel wall trying to heal it. This is why we have diabetics on low dose aspirin daily, this prevents the platelets from sticking and can prevent plaque formation. In addition, your body takes cholesterol and tries to shove it into the open wound. This is why we want your LDL, or bad cholesterol, below 70.   The combination of platelets and cholesterol over 5-10 years forms plaque that can break off and cause a heart attack or stroke.   PLEASE REMEMBER:  Diabetes is preventable! Up to 75 percent of complications and morbidities among individuals with type 2 diabetes can be prevented, delayed, or effectively treated and minimized with regular visits to a health professional, appropriate monitoring and medication, and a healthy diet and lifestyle.  We want weight loss that will last so you should lose 1-2 pounds a week.  THAT IS IT! Please pick THREE things a month to change. Once it is a habit check off the item. Then pick another three items off the list to become habits.  If you are already doing a habit on the list GREAT!  Cross that item off! o Don't drink your calories. Ie, alcohol, soda, fruit juice, and sweet tea.  o Drink more water. Drink a glass when you feel hungry or before each meal.  o Eat breakfast - Complex carb and protein (likeDannon light and fit  yogurt, oatmeal, fruit, eggs, [place] bacon). o Measure your cereal.  Eat no more than one cup a day. (ie [place]) o Eat an apple a day. o Add a vegetable a day. o Try a new vegetable a month. o Use Pam! Stop using oil or butter to cook. o Don't finish your plate or use smaller plates. o Share your dessert. o Eat sugar free Jello for dessert or frozen grapes. o Don't eat 2-3 hours before bed. o Switch to whole wheat bread, pasta, and brown rice. o Make healthier choices when you eat out. No fries! o Pick baked chicken, NOT fried. o Don't forget to SLOW DOWN when you eat. It is not going anywhere.  o Take the stairs. o Park far away in the parking lot o [employer] (or weights) for 10 minutes while watching TV. o Walk at work for 10 minutes during break. o Walk outside 1 time a week with your friend, kids, dog, or significant other. o Start a walking group at [place] the mall as much as you can tolerate.  o Keep a food diary. o Weigh yourself daily. o Walk for 15 minutes 3 days per week. o Cook at home more often and eat out less.  If life happens and you go back to old habits, it is okay.  Just start over. You can do it!   If you experience chest pain, get short of breath, or tired during the exercise, please stop immediately and inform your doctor.

## 2015-05-17 LAB — URINALYSIS, ROUTINE W REFLEX MICROSCOPIC
Bilirubin Urine: NEGATIVE
GLUCOSE, UA: NEGATIVE
Hgb urine dipstick: NEGATIVE
KETONES UR: NEGATIVE
LEUKOCYTES UA: NEGATIVE
NITRITE: NEGATIVE
PH: 6 (ref 5.0–8.0)
Protein, ur: NEGATIVE
SPECIFIC GRAVITY, URINE: 1.023 (ref 1.001–1.035)

## 2015-05-17 LAB — PSA: PSA: 0.44 ng/mL (ref <=4.00)

## 2015-05-17 LAB — TSH: TSH: 1.677 u[IU]/mL (ref 0.350–4.500)

## 2015-05-17 LAB — MICROALBUMIN / CREATININE URINE RATIO
Creatinine, Urine: 184 mg/dL (ref 20–370)
MICROALB UR: 2.5 mg/dL
MICROALB/CREAT RATIO: 14 ug/mg{creat} (ref <30)

## 2015-05-21 DIAGNOSIS — Z23 Encounter for immunization: Secondary | ICD-10-CM | POA: Diagnosis not present

## 2015-05-23 ENCOUNTER — Ambulatory Visit (HOSPITAL_COMMUNITY)
Admission: RE | Admit: 2015-05-23 | Discharge: 2015-05-23 | Disposition: A | Discharge status: Home / Self Care | Payer: Medicare Other | Source: Ambulatory Visit | Attending: Nurse Practitioner | Admitting: Nurse Practitioner

## 2015-05-23 VITALS — BP 132/86 | HR 78 | Ht 70.0 in | Wt 272.8 lb

## 2015-05-23 DIAGNOSIS — I481 Persistent atrial fibrillation: Secondary | ICD-10-CM | POA: Diagnosis not present

## 2015-05-23 DIAGNOSIS — I4819 Other persistent atrial fibrillation: Secondary | ICD-10-CM

## 2015-05-24 ENCOUNTER — Encounter (HOSPITAL_COMMUNITY): Payer: Self-pay | Admitting: Nurse Practitioner

## 2015-05-24 NOTE — Progress Notes (Signed)
Patient ID: Christopher Wade, male   DOB: [birth year], 64 y.o.   MRN: [id number]     Primary Care Physician: Alesia Richards, MD Referring Physician: Dr. Charlies Silvers is a 64 y.o. male with a h/o obesity since January of 2016 picked up by his PCP at time of his physical. He was started on atenolol, Cardizem and xarelto. He did not tolerate the CCB and it was stopped.He underwent DCCV 03/2015 with initial  successful cardioversion but had returned to Afib before the left the hosptital. He is in the afib clinic for further options.   He states that being in afib does not really bother him. 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 If really exertional, he will feel winded. He does have sleep apnea and does wear cpap. No alcohol, minimal caffeine, no smoking. He is obese and does not regularly exercise as he says he is very active on the job.He is rate controlled at 78 bpm. Is being compliant with xarelto without bleeding issues. He started Lamisil a few weeks ago for a fungal infection involving his feet. He denies having any exertional chest pain but has never had a stress test.   Today, he denies symptoms of palpitations, chest pain, shortness of breath, orthopnea, PND, lower extremity edema, dizziness, presyncope, syncope, or neurologic sequela. The patient is tolerating medications without difficulties and is otherwise without complaint today.   Past Medical History  Diagnosis Date  . Hypertension   . Hyperlipidemia   . Diabetes mellitus without complication ([place])   . Thyroid disease   . Vitamin D deficiency   . Allergy   . Arthritis     lumbar  . Atrial fibrillation, persistent ([place])     s/p DCCV 03/2015 with reversion back to atrial fibrillation  . OSA (obstructive sleep apnea)     noncompliant with CPAP   Past Surgical History  Procedure Laterality Date  . Joint replacement Bilateral 2001    Hips  . Cardioversion N/A 04/05/2015    Procedure:  CARDIOVERSION;  Surgeon: [name], MD;  Location: [facility];  Service: Cardiovascular;  Laterality: N/A;    Current Outpatient Prescriptions  Medication Sig Dispense Refill  . allopurinol (ZYLOPRIM) 300 MG tablet Take 1 tablet (300 mg total) by mouth daily. 30 tablet 2  . amLODipine (NORVASC) 5 MG tablet take 1 tablet by mouth twice a day 60 tablet 3  . atenolol (TENORMIN) 100 MG tablet Take 0.5 tablets (50 mg total) by mouth 2 (two) times daily. 90 tablet 1  . cholecalciferol (VITAMIN D) 1000 UNITS tablet Take 1,000 Units by mouth daily.    . colchicine 0.6 MG tablet take 1 tablet by mouth twice a day 60 tablet 2  . HYDROcodone-acetaminophen (NORCO/VICODIN) 5-325 MG tablet Take 1 tablet by mouth every 6 (six) hours as needed for moderate pain. 90 tablet 0  . levothyroxine (SYNTHROID, LEVOTHROID) 50 MCG tablet take 1 tablet by mouth once daily 90 tablet 0  . Magnesium 250 MG TABS Take 250 mg by mouth 2 (two) times daily.     . metFORMIN (GLUCOPHAGE) 500 MG tablet take 1 tablet by mouth twice a day with food 60 tablet 3  . nystatin cream (MYCOSTATIN) Apply 1 application topically 2 (two) times daily as needed (wound care). 30 g 1  . Omega-3 Fatty Acids (FISH OIL) 1200 MG CAPS Take 1,200 mg by mouth daily.    [name] terbinafine (LAMISIL) 250 MG  tablet Take 1 tablet (250 mg total) by mouth daily. Take one daily for 3 months, need liver function lab at 6 weeks. 90 tablet 0  . XARELTO 20 MG TABS tablet take 1 tablet by mouth once daily 90 tablet 1   No current facility-administered medications for this encounter.    Allergies  Allergen Reactions  . Bee Venom Other (See Comments)    Leg went numb    Social History   Social History  . Marital Status: Single    Spouse Name: N/A  . Number of Children: N/A  . Years of Education: N/A   Occupational History  . Not on file.   Social History Main Topics  . Smoking status: Never Smoker   . Smokeless tobacco: Never Used  . Alcohol Use:  No  . Drug Use: No  . Sexual Activity: Not Currently   Other Topics Concern  . Not on file   Social History Narrative    Family History  Problem Relation Age of Onset  . Cancer Mother 24    Lung  . Cancer Father 70    lung  . Cancer Sister 58    colon    ROS- All systems are reviewed and negative except as per the HPI above  Physical Exam: Filed Vitals:   05/23/15 1524  BP: 132/86  Pulse: 78  Height: 5\' 10"  (D34-534 m)  Weight: 272 lb 12.8 oz (123.741 kg)    GEN- The patient is well appearing, alert and oriented x 3 today.   Head- normocephalic, atraumatic Eyes-  Sclera clear, conjunctiva pink Ears- hearing intact Oropharynx- clear Neck- supple, no JVP Lymph- no cervical lymphadenopathy Lungs- Clear to ausculation bilaterally, normal work of breathing Heart- irregular rate and rhythm, no murmurs, rubs or gallops, PMI not laterally displaced GI- soft, NT, ND, + BS Extremities- no clubbing, cyanosis, or edema MS- no significant deformity or atrophy Skin- no rash or lesion Psych- euthymic mood, full affect Neuro- strength and sensation are intact  EKG- afib with v rate of 78 bpm, qrs int 82 ms, qtc 428 ms.  Echo-  Left ventricle: The cavity size was normal. Wall thickness was increased in a pattern of mild LVH. Systolic function was normal. The estimated ejection fraction was in the range of 60% to 65%. Indeterminant diastolic function (atrial fibrillation). Wall motion was normal; there were no regional wall motion abnormalities. - Aortic valve: There was no stenosis. - Aorta: Borderline dilated aortic root. - Mitral valve: There was trivial regurgitation. - Left atrium: The atrium was mildly dilated. - Right ventricle: The cavity size was moderately dilated. Systolic function was normal. - Right atrium: The atrium was mildly dilated. - Tricuspid valve: Peak RV-RA gradient (S): 32 mm Hg. - Pulmonary arteries: PA peak pressure: 35 mm Hg (S). -  Inferior vena cava: The vessel was normal in size. The respirophasic diameter changes were in the normal range (>= 50%), consistent with normal central venous pressure.  Impressions:  - The patient was in atrial fibrillation. Normal LV size with mild LV hypertrophy. EF 60-65%. Moderately dilated RV with normal systolic function. Borderline pulmonary hypertension.  Assessment and Plan: 1. Persistent afib For now the pt is rate controlled and minimally symptomatic He would probably for the long run be better if SR was restored Discussed with pt AAD therapy and flecainide may be a good choice for him but he would need a lexi myoview prior to starting drug. I discussed with clinical pharmacist and for as  long as the pt is on Lamisil ( for the next 3 months), there would be an interaction with flecainide( interferes with clearance ), therefore will have to wait until pt finishes drug to start antiarrythmic. Pt is in agreement with this plan and will call the office when finished with drug and will then put above plans  into place.  2. Lifestyle issues Encouraged weight loss Regular exercise program Continue to use cpap  F/u per pt when finishes anti fungal  Butch Penny C. Byanka Landrus, [facility] [address] [place], [place] [address] [phone]

## 2015-06-02 ENCOUNTER — Other Ambulatory Visit: Payer: Self-pay | Admitting: Internal Medicine

## 2015-06-17 ENCOUNTER — Other Ambulatory Visit: Payer: Self-pay | Admitting: *Deleted

## 2015-06-17 MED ORDER — APIXABAN 5 MG PO TABS: 5.0000 mg | ORAL_TABLET | Freq: Two times a day (BID) | ORAL | Status: DC

## 2015-06-24 ENCOUNTER — Other Ambulatory Visit: Payer: Self-pay | Admitting: Internal Medicine

## 2015-06-25 ENCOUNTER — Other Ambulatory Visit: Payer: Self-pay | Admitting: Physician Assistant

## 2015-06-25 MED ORDER — RIVAROXABAN 20 MG PO TABS: 20.0000 mg | ORAL_TABLET | Freq: Every day | ORAL | Status: DC

## 2015-06-27 ENCOUNTER — Other Ambulatory Visit: Payer: Self-pay | Admitting: Internal Medicine

## 2015-06-27 ENCOUNTER — Other Ambulatory Visit: Payer: Medicare Other

## 2015-06-27 DIAGNOSIS — Z79899 Other long term (current) drug therapy: Secondary | ICD-10-CM

## 2015-06-27 LAB — HEPATIC FUNCTION PANEL
ALBUMIN: 4.1 g/dL (ref 3.6–5.1)
ALK PHOS: 70 U/L (ref 40–115)
ALT: 36 U/L (ref 9–46)
AST: 31 U/L (ref 10–35)
BILIRUBIN TOTAL: 0.6 mg/dL (ref 0.2–1.2)
Bilirubin, Direct: 0.1 mg/dL (ref <=0.2)
Indirect Bilirubin: 0.5 mg/dL (ref 0.2–1.2)
Total Protein: 6.9 g/dL (ref 6.1–8.1)

## 2015-07-01 ENCOUNTER — Other Ambulatory Visit: Payer: Self-pay | Admitting: Internal Medicine

## 2015-07-01 ENCOUNTER — Other Ambulatory Visit: Payer: Self-pay | Admitting: Cardiology

## 2015-07-02 NOTE — Telephone Encounter (Signed)
Pt requesting a refill on Allopurinol 300 mg tab. Please advise

## 2015-07-02 NOTE — Telephone Encounter (Signed)
Needs to get this from PCP

## 2015-07-03 ENCOUNTER — Other Ambulatory Visit: Payer: Self-pay | Admitting: Physician Assistant

## 2015-08-13 ENCOUNTER — Encounter (HOSPITAL_COMMUNITY): Payer: Self-pay | Admitting: *Deleted

## 2015-08-29 ENCOUNTER — Encounter: Payer: Self-pay | Admitting: Physician Assistant

## 2015-08-29 ENCOUNTER — Ambulatory Visit (INDEPENDENT_AMBULATORY_CARE_PROVIDER_SITE_OTHER): Payer: Medicare Other | Admitting: Physician Assistant

## 2015-08-29 VITALS — BP 132/74 | HR 70 | Temp 97.9°F | Resp 16 | Ht 70.0 in | Wt 263.2 lb

## 2015-08-29 DIAGNOSIS — E785 Hyperlipidemia, unspecified: Secondary | ICD-10-CM | POA: Diagnosis not present

## 2015-08-29 DIAGNOSIS — Z79899 Other long term (current) drug therapy: Secondary | ICD-10-CM | POA: Diagnosis not present

## 2015-08-29 DIAGNOSIS — E1122 Type 2 diabetes mellitus with diabetic chronic kidney disease: Secondary | ICD-10-CM | POA: Diagnosis not present

## 2015-08-29 DIAGNOSIS — E669 Obesity, unspecified: Secondary | ICD-10-CM

## 2015-08-29 DIAGNOSIS — E079 Disorder of thyroid, unspecified: Secondary | ICD-10-CM

## 2015-08-29 DIAGNOSIS — E1129 Type 2 diabetes mellitus with other diabetic kidney complication: Secondary | ICD-10-CM | POA: Diagnosis not present

## 2015-08-29 DIAGNOSIS — N182 Chronic kidney disease, stage 2 (mild): Secondary | ICD-10-CM

## 2015-08-29 DIAGNOSIS — I10 Essential (primary) hypertension: Secondary | ICD-10-CM | POA: Diagnosis not present

## 2015-08-29 DIAGNOSIS — E559 Vitamin D deficiency, unspecified: Secondary | ICD-10-CM | POA: Diagnosis not present

## 2015-08-29 DIAGNOSIS — I4819 Other persistent atrial fibrillation: Secondary | ICD-10-CM

## 2015-08-29 DIAGNOSIS — I481 Persistent atrial fibrillation: Secondary | ICD-10-CM | POA: Diagnosis not present

## 2015-08-29 LAB — BASIC METABOLIC PANEL WITH GFR
BUN: 20 mg/dL (ref 7–25)
CO2: 25 mmol/L (ref 20–31)
CREATININE: 1.01 mg/dL (ref 0.70–1.25)
Calcium: 8.8 mg/dL (ref 8.6–10.3)
Chloride: 103 mmol/L (ref 98–110)
GFR, Est Non African American: 78 mL/min (ref >=60)
GLUCOSE: 137 mg/dL — AB (ref 65–99)
POTASSIUM: 3.6 mmol/L (ref 3.5–5.3)
Sodium: 140 mmol/L (ref 135–146)

## 2015-08-29 LAB — HEPATIC FUNCTION PANEL
ALBUMIN: 4 g/dL (ref 3.6–5.1)
ALK PHOS: 69 U/L (ref 40–115)
ALT: 23 U/L (ref 9–46)
AST: 21 U/L (ref 10–35)
Bilirubin, Direct: 0.1 mg/dL (ref <=0.2)
Indirect Bilirubin: 0.6 mg/dL (ref 0.2–1.2)
TOTAL PROTEIN: 6.9 g/dL (ref 6.1–8.1)
Total Bilirubin: 0.7 mg/dL (ref 0.2–1.2)

## 2015-08-29 LAB — LIPID PANEL
Cholesterol: 155 mg/dL (ref 125–200)
HDL: 36 mg/dL — ABNORMAL LOW (ref >=40)
LDL CALC: 85 mg/dL (ref <130)
TRIGLYCERIDES: 171 mg/dL — AB (ref <150)
Total CHOL/HDL Ratio: 4.3 Ratio (ref <=5.0)
VLDL: 34 mg/dL — ABNORMAL HIGH (ref <30)

## 2015-08-29 LAB — CBC WITH DIFFERENTIAL/PLATELET
BASOS ABS: 0 {cells}/uL (ref 0–200)
Basophils Relative: 0 %
EOS ABS: 134 {cells}/uL (ref 15–500)
Eosinophils Relative: 2 %
HEMATOCRIT: 43.9 % (ref 38.5–50.0)
Hemoglobin: 14.9 g/dL (ref 13.2–17.1)
LYMPHS PCT: 30 %
Lymphs Abs: 2010 cells/uL (ref 850–3900)
MCH: 34.2 pg — AB (ref 27.0–33.0)
MCHC: 33.9 g/dL (ref 32.0–36.0)
MCV: 100.7 fL — AB (ref 80.0–100.0)
MONO ABS: 536 {cells}/uL (ref 200–950)
MONOS PCT: 8 %
MPV: 10.8 fL (ref 7.5–12.5)
NEUTROS PCT: 60 %
Neutro Abs: 4020 cells/uL (ref 1500–7800)
Platelets: 208 10*3/uL (ref 140–400)
RBC: 4.36 MIL/uL (ref 4.20–5.80)
RDW: 15.2 % — AB (ref 11.0–15.0)
WBC: 6.7 10*3/uL (ref 3.8–10.8)

## 2015-08-29 LAB — MAGNESIUM: MAGNESIUM: 1.6 mg/dL (ref 1.5–2.5)

## 2015-08-29 MED ORDER — HYDROCODONE-ACETAMINOPHEN 5-325 MG PO TABS: 1.0000 | ORAL_TABLET | Freq: Four times a day (QID) | ORAL | Status: DC | PRN

## 2015-08-29 NOTE — Patient Instructions (Addendum)
Can call Dr. Toy Cookey to get evaluated for dental sleep appliance. # [phone]  OR you can try Dr. Clearence Ped in [facility] # [phone]  We will send notes. Call and get price quote on both.      Bad carbs also include fruit juice, alcohol, and sweet tea. These are empty calories that do not signal to your brain that you are full.   Please remember the good carbs are still carbs which convert into sugar. So please measure them out no more than 1/2-1 cup of rice, oatmeal, pasta, and beans  Veggies are however free foods! Pile them on.   Not all fruit is created equal. Please see the list below, the fruit at the bottom is higher in sugars than the fruit at the top. Please avoid all dried fruits.     Recommendations For Diabetic/Prediabetic Patients:   -  Take medications as prescribed  -  Recommend Dr Fara Olden Fuhrman's book "The End of Diabetes "  And "The End of Dieting"- Can get at  [email or web address] and encourage also get the Audio CD book  - AVOID Animal products, ie. Meat - red/white, Poultry and Dairy/especially cheese - Exercise at least 5 times a week for 30 minutes or preferably daily.  - No Smoking - Drink less than 2 drinks a day.  - Monitor your feet for sores - Have yearly Eye Exams - Recommend annual Flu vaccine  - Recommend Pneumovax and Prevnar vaccines - Shingles Vaccine (Zostavax) if over 21 y.o.  Goals:   - BMI less than 24 - Fasting sugar less than 130 or less than 150 if tapering medicines to lose weight  - Systolic BP less than [id number]  - Diastolic BP less than 80 - Bad LDL Cholesterol less than 70 - Triglycerides less than 150

## 2015-08-29 NOTE — Progress Notes (Signed)
Assessment and Plan:  1. Hypertension -Continue medication, monitor blood pressure at home. Continue DASH diet.  Reminder to go to the ER if any CP, SOB, nausea, dizziness, severe HA, changes vision/speech, left arm numbness and tingling and jaw pain.  2. Cholesterol -Continue diet and exercise. Check cholesterol.   3. Diabetes with diabetic chronic kidney disease -Continue diet and exercise. Check A1C Recheck BMP/GFR  4. Vitamin D Def - check level and continue medications.   5. Hypothyroidism -check TSH level, continue medications the same, reminded to take on an empty stomach 30-26mins before food.   6. Obesity with co morbidities - long discussion about weight loss, diet, and exercise  7. Gout - recheck Uric acid as needed, Diet discussed, continue medications.  8. Afib rate controlled Continue following up with cardio, still unable to tolerate CPAP due to dryness.   9. OA  Patient still works, will take Norco PRN for aches and pains- 90 day will last over 4 months normally Discussed addictive nature and if any over use will have to refer to pain management, patient expressed understands, has signed pain contract.    Continue diet and meds as discussed. Further disposition pending results of labs. Discussed med's effects and SE's.    HPI 65 y.o. male  presents for 3 month follow up  has a past medical history of Hypertension; Hyperlipidemia; Diabetes mellitus without complication ([place]); Thyroid disease; Vitamin D deficiency; Allergy; Arthritis; Atrial fibrillation, persistent ([place]); and OSA (obstructive sleep apnea).  His blood pressure has been controlled at home, today his BP is BP: 132/74 mmHg.  He does workout, he is very active at work but with Afib, has had days with some dyspnea/fatigue with exertion, going to follow up with cardio. He is on xarelto and rate is controlled.[name]   He is on cholesterol medication, fenofibrate stopped due to myalgias and elevated CPK. His  cholesterol is not at goal. The cholesterol was:   Lab Results  Component Value Date   CHOL 175 05/16/2015   HDL 43 05/16/2015   LDLCALC 103 05/16/2015   TRIG 145 05/16/2015   CHOLHDL 4.1 05/16/2015   Lab Results  Component Value Date   CKTOTAL 73 08/20/2014   His last kidney function was worse, will recheck  Lab Results  Component Value Date   GFRNONAA 81 05/16/2015   He was started on allopurinol for elevated uric acid.  Very physical with his job and uses vicodin PRN, can not use NSAIDS.   He has been working on diet and exercise for diabetes with diabetic chronic kidney disease, he is on bASA, he is on ACE/ARB, and denies  paresthesia of the feet and polydipsia. Last A1C was:  Lab Results  Component Value Date   HGBA1C 6.5* 05/16/2015  Patient is on Vitamin D supplement. Lab Results  Component Value Date   VD25OH 13 11/12/2014  He is on thyroid medication. His medication was not changed last visit.   Lab Results  Component Value Date   TSH 1.677 05/16/2015  BMI is Body mass index is 37.77 kg/(m^2)., he is working on diet and exercise. Wt Readings from Last 3 Encounters:  08/29/15 263 lb 3.2 oz (119.387 kg)  05/23/15 272 lb 12.8 oz (123.741 kg)  05/16/15 275 lb (124.739 kg)    Current Medications:  Current Outpatient Prescriptions on File Prior to Visit  Medication Sig Dispense Refill  . allopurinol (ZYLOPRIM) 300 MG tablet take 1 tablet by mouth once daily 30 tablet 2  .  amLODipine (NORVASC) 5 MG tablet take 1 tablet by mouth twice a day 60 tablet 3  . atenolol (TENORMIN) 100 MG tablet Take 0.5 tablets (50 mg total) by mouth 2 (two) times daily. 90 tablet 1  . cholecalciferol (VITAMIN D) 1000 UNITS tablet Take 1,000 Units by mouth daily.    [name] COLCRYS 0.6 MG tablet take 1 tablet by mouth twice a day 60 tablet 2  . HYDROcodone-acetaminophen (NORCO/VICODIN) 5-325 MG tablet Take 1 tablet by mouth every 6 (six) hours as needed for moderate pain. 90 tablet 0  .  levothyroxine (SYNTHROID, LEVOTHROID) 50 MCG tablet take 1 tablet by mouth once daily 90 tablet 0  . Magnesium 250 MG TABS Take 250 mg by mouth 2 (two) times daily.     . metFORMIN (GLUCOPHAGE) 500 MG tablet take 1 tablet by mouth twice a day with food 60 tablet 3  . nystatin cream (MYCOSTATIN) Apply 1 application topically 2 (two) times daily as needed (wound care). 30 g 1  . Omega-3 Fatty Acids (FISH OIL) 1200 MG CAPS Take 1,200 mg by mouth daily.    . rivaroxaban (XARELTO) 20 MG TABS tablet Take 1 tablet (20 mg total) by mouth daily with supper. 30 tablet 4   No current facility-administered medications on file prior to visit.   Medical History:  Past Medical History  Diagnosis Date  . Hypertension   . Hyperlipidemia   . Diabetes mellitus without complication ([place])   . Thyroid disease   . Vitamin D deficiency   . Allergy   . Arthritis     lumbar  . Atrial fibrillation, persistent ([place])     s/p DCCV 03/2015 with reversion back to atrial fibrillation  . OSA (obstructive sleep apnea)     noncompliant with CPAP   Allergies:  Allergies  Allergen Reactions  . Bee Venom Other (See Comments)    Leg went numb     Review of Systems:  Review of Systems  Constitutional: Negative.   HENT: Negative.   Eyes: Negative.   Respiratory: Positive for shortness of breath. Negative for cough, hemoptysis, sputum production and wheezing.   Cardiovascular: Positive for leg swelling. Negative for chest pain, palpitations, orthopnea, claudication and PND.  Gastrointestinal: Negative.   Genitourinary: Negative.   Musculoskeletal: Positive for myalgias, back pain and joint pain. Negative for falls and neck pain.  Skin: Negative.   Neurological: Negative.   Endo/Heme/Allergies: Negative.   Psychiatric/Behavioral: Negative.     Family history- Review and unchanged Social history- Review and unchanged Physical Exam: BP 132/74 mmHg  Pulse 70  Temp(Src) 97.9 F (36.6 C) (Temporal)  Resp 16   Ht 5\' 10"  (1.778 m)  Wt 263 lb 3.2 oz (119.387 kg)  BMI 37.77 kg/m2  SpO2 98% Wt Readings from Last 3 Encounters:  08/29/15 263 lb 3.2 oz (119.387 kg)  05/23/15 272 lb 12.8 oz (123.741 kg)  05/16/15 275 lb (124.739 kg)   General Appearance: Well nourished, in no apparent distress. Eyes: PERRLA, EOMs, conjunctiva no swelling or erythema Sinuses: No Frontal/maxillary tenderness ENT/Mouth: Ext aud canals clear, TMs without erythema, bulging. No erythema, swelling, or exudate on post pharynx.  Tonsils not swollen or erythematous. Hearing normal.  Neck: Supple, thyroid normal.  Respiratory: Respiratory effort normal, BS equal bilaterally without rales, rhonchi, wheezing or stridor.  Cardio: Irreg irreg with no MRGs. Brisk peripheral pulses with 2+ edema.  Abdomen: Soft, + BS, obese, Non tender, no guarding, rebound, hernias, masses. Lymphatics: Non tender without lymphadenopathy.  Musculoskeletal:  Full ROM, 5/5 strength, Normal gait Skin: Right ear with scaly area. Warm, dry without rashes, lesions, ecchymosis.  Neuro: Cranial nerves intact. No cerebellar symptoms.  Psych: Awake and oriented X 3, normal affect, Insight and Judgment appropriate.    Vicie Mutters, PA-C 4:53 PM [facility]

## 2015-08-30 ENCOUNTER — Other Ambulatory Visit: Payer: Self-pay | Admitting: Physician Assistant

## 2015-08-30 ENCOUNTER — Other Ambulatory Visit: Payer: Self-pay | Admitting: Internal Medicine

## 2015-08-30 LAB — HEMOGLOBIN A1C
HEMOGLOBIN A1C: 6.1 % — AB (ref <5.7)
Mean Plasma Glucose: 128 mg/dL

## 2015-08-30 LAB — TSH: TSH: 2.28 m[IU]/L (ref 0.40–4.50)

## 2015-09-09 ENCOUNTER — Ambulatory Visit (HOSPITAL_COMMUNITY)
Admission: RE | Admit: 2015-09-09 | Discharge: 2015-09-09 | Disposition: A | Discharge status: Home / Self Care | Payer: Medicare Other | Source: Ambulatory Visit | Attending: Nurse Practitioner | Admitting: Nurse Practitioner

## 2015-09-09 ENCOUNTER — Encounter (HOSPITAL_COMMUNITY): Payer: Self-pay | Admitting: Nurse Practitioner

## 2015-09-09 VITALS — BP 148/80 | HR 96 | Ht 70.0 in | Wt 260.4 lb

## 2015-09-09 DIAGNOSIS — Z7984 Long term (current) use of oral hypoglycemic drugs: Secondary | ICD-10-CM | POA: Diagnosis not present

## 2015-09-09 DIAGNOSIS — G4733 Obstructive sleep apnea (adult) (pediatric): Secondary | ICD-10-CM | POA: Insufficient documentation

## 2015-09-09 DIAGNOSIS — I481 Persistent atrial fibrillation: Secondary | ICD-10-CM | POA: Diagnosis not present

## 2015-09-09 DIAGNOSIS — E669 Obesity, unspecified: Secondary | ICD-10-CM | POA: Diagnosis not present

## 2015-09-09 DIAGNOSIS — Z6837 Body mass index (BMI) 37.0-37.9, adult: Secondary | ICD-10-CM | POA: Diagnosis not present

## 2015-09-09 DIAGNOSIS — Z7901 Long term (current) use of anticoagulants: Secondary | ICD-10-CM | POA: Diagnosis not present

## 2015-09-09 DIAGNOSIS — E559 Vitamin D deficiency, unspecified: Secondary | ICD-10-CM | POA: Insufficient documentation

## 2015-09-09 DIAGNOSIS — E119 Type 2 diabetes mellitus without complications: Secondary | ICD-10-CM | POA: Insufficient documentation

## 2015-09-09 DIAGNOSIS — E785 Hyperlipidemia, unspecified: Secondary | ICD-10-CM | POA: Diagnosis not present

## 2015-09-09 DIAGNOSIS — E079 Disorder of thyroid, unspecified: Secondary | ICD-10-CM | POA: Diagnosis not present

## 2015-09-09 DIAGNOSIS — I10 Essential (primary) hypertension: Secondary | ICD-10-CM | POA: Insufficient documentation

## 2015-09-09 DIAGNOSIS — I4819 Other persistent atrial fibrillation: Secondary | ICD-10-CM

## 2015-09-09 DIAGNOSIS — Z79899 Other long term (current) drug therapy: Secondary | ICD-10-CM | POA: Diagnosis not present

## 2015-09-09 DIAGNOSIS — I4891 Unspecified atrial fibrillation: Secondary | ICD-10-CM | POA: Diagnosis present

## 2015-09-09 NOTE — Progress Notes (Signed)
Patient ID: Christopher Wade, male   DOB: 1950-06-10, 65 y.o.   MRN: [id number]     Primary Care Physician: Alesia Richards, MD Referring Physician: Dr. Charlies Silvers is a 65 y.o. male with a h/o obesity since June of 2016 picked up by his PCP at time of his physical. He was started on atenolol, Cardizem and xarelto. He did not tolerate the CCB and it was stopped.He underwent DCCV 03/2015 with initial  successful cardioversion but had returned to Afib before the left the hosptital. He is in the afib clinic for further options.   He stated on previous visit ins afib clinic, that being in afib does not really bother him. 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 If really exertional, he will feel winded. He does have sleep apnea and does wear cpap. No alcohol, minimal caffeine, no smoking. He is obese and does not regularly exercise as he says he is very active on the job.He is rate controlled at 78 bpm. Is being compliant with xarelto without bleeding issues. He started Lamisil a few weeks ago for a fungal infection involving his feet. He denies having any exertional chest pain but has never had a stress test. This drug prevented him for starting antiarrythmic drug therapy secondary to possible drug/drug interactions.  He returns to afib clinic now off Lamisil x one month.He is still interested in restoring SR. Flecainide will be a good option with him denying CAD but will need a stress test to confirm this. He has noticed at work increasing shortness of breath with activity.  He is not drinking alcohol, no smoking, no large amounts of caffeine.   Today, he denies symptoms of palpitations, chest pain, orthopnea, PND, lower extremity edema, dizziness, presyncope, syncope, or neurologic sequela.  Positive for shortness of breath.The patient is tolerating medications without difficulties and is otherwise without complaint today.   Past Medical History  Diagnosis Date    . Hypertension   . Hyperlipidemia   . Diabetes mellitus without complication ([place])   . Thyroid disease   . Vitamin D deficiency   . Allergy   . Arthritis     lumbar  . Atrial fibrillation, persistent ([place])     s/p DCCV 03/2015 with reversion back to atrial fibrillation  . OSA (obstructive sleep apnea)     noncompliant with CPAP   Past Surgical History  Procedure Laterality Date  . Joint replacement Bilateral 2001    Hips  . Cardioversion N/A 04/05/2015    Procedure: CARDIOVERSION;  Surgeon: [name], MD;  Location: [facility];  Service: Cardiovascular;  Laterality: N/A;    Current Outpatient Prescriptions  Medication Sig Dispense Refill  . allopurinol (ZYLOPRIM) 300 MG tablet take 1 tablet by mouth once daily 30 tablet 2  . amLODipine (NORVASC) 5 MG tablet take 1 tablet by mouth twice a day 60 tablet 3  . atenolol (TENORMIN) 100 MG tablet Take 0.5 tablets (50 mg total) by mouth 2 (two) times daily. 90 tablet 1  . cholecalciferol (VITAMIN D) 1000 UNITS tablet Take 1,000 Units by mouth daily.    . colchicine 0.6 MG tablet Take 1 tablet daily to prevent Gout 90 tablet 0  . HYDROcodone-acetaminophen (NORCO/VICODIN) 5-325 MG tablet Take 1 tablet by mouth every 6 (six) hours as needed for moderate pain. 90 tablet 0  . levothyroxine (SYNTHROID, LEVOTHROID) 50 MCG tablet take 1 tablet by mouth once daily 90 tablet 0  .  Magnesium 250 MG TABS Take 250 mg by mouth 2 (two) times daily.     . metFORMIN (GLUCOPHAGE) 500 MG tablet take 1 tablet by mouth twice a day with food 60 tablet 3  . nystatin cream (MYCOSTATIN) Apply 1 application topically 2 (two) times daily as needed (wound care). 30 g 1  . Omega-3 Fatty Acids (FISH OIL) 1200 MG CAPS Take 1,200 mg by mouth daily.    . rivaroxaban (XARELTO) 20 MG TABS tablet Take 1 tablet (20 mg total) by mouth daily with supper. 30 tablet 4  . valsartan (DIOVAN) 320 MG tablet take 1 tablet by mouth once daily 90 tablet 0   No current  facility-administered medications for this encounter.    Allergies  Allergen Reactions  . Bee Venom Other (See Comments)    Leg went numb    Social History   Social History  . Marital Status: Single    Spouse Name: N/A  . Number of Children: N/A  . Years of Education: N/A   Occupational History  . Not on file.   Social History Main Topics  . Smoking status: Never Smoker   . Smokeless tobacco: Never Used  . Alcohol Use: No  . Drug Use: No  . Sexual Activity: Not Currently   Other Topics Concern  . Not on file   Social History Narrative    Family History  Problem Relation Age of Onset  . Cancer Mother 76    Lung  . Cancer Father 5    lung  . Cancer Sister 66    colon    ROS- All systems are reviewed and negative except as per the HPI above  Physical Exam: Filed Vitals:   09/09/15 1505  BP: 148/80  Pulse: 96  Height: 5\' 10"  (1.778 m)  Weight: 260 lb 6.4 oz (118.117 kg)    GEN- The patient is well appearing, alert and oriented x 3 today.   Head- normocephalic, atraumatic Eyes-  Sclera clear, conjunctiva pink Ears- hearing intact Oropharynx- clear Neck- supple, no JVP Lymph- no cervical lymphadenopathy Lungs- Clear to ausculation bilaterally, normal work of breathing Heart- irregular rate and rhythm, no murmurs, rubs or gallops, PMI not laterally displaced GI- soft, NT, ND, + BS Extremities- no clubbing, cyanosis, or edema MS- no significant deformity or atrophy Skin- no rash or lesion Psych- euthymic mood, full affect Neuro- strength and sensation are intact  EKG- afib with v rate of 96 bpm, qrs int 84 ms, qtc 434 ms.  Echo-  Left ventricle: The cavity size was normal. Wall thickness was increased in a pattern of mild LVH. Systolic function was normal. The estimated ejection fraction was in the range of 60% to 65%. Indeterminant diastolic function (atrial fibrillation). Wall motion was normal; there were no regional wall  motion abnormalities. - Aortic valve: There was no stenosis. - Aorta: Borderline dilated aortic root. - Mitral valve: There was trivial regurgitation. - Left atrium: The atrium was mildly dilated. - Right ventricle: The cavity size was moderately dilated. Systolic function was normal. - Right atrium: The atrium was mildly dilated. - Tricuspid valve: Peak RV-RA gradient (S): 32 mm Hg. - Pulmonary arteries: PA peak pressure: 35 mm Hg (S). - Inferior vena cava: The vessel was normal in size. The respirophasic diameter changes were in the normal range (>= 50%), consistent with normal central venous pressure.  Impressions:  - The patient was in atrial fibrillation. Normal LV size with mild LV hypertrophy. EF 60-65%. Moderately dilated RV  with normal systolic function. Borderline pulmonary hypertension.  Assessment and Plan: 1. Persistent afib For now the pt is rate controlled and minimally symptomatic He would probably for the long run be better if SR was restored Discussed with pt AAD therapy and flecainide may be a good choice for him but he would need a lexi myoview prior to starting drug. He is now off Lamisil x one month and will schedule for lexi myoview Once results are known, he will started on flecainide with plans for cardioversion   2. Lifestyle issues Encouraged weight loss Regular exercise program Sleep apnea in past with noncompliance with cpap  F/u will depend on results of stress test He will return to clinic one week after anticipated starting flecainide for ekg  Donna C. [name], [facility] [address] [place], [place] [address] [phone]

## 2015-09-11 DIAGNOSIS — H2513 Age-related nuclear cataract, bilateral: Secondary | ICD-10-CM | POA: Diagnosis not present

## 2015-09-17 ENCOUNTER — Other Ambulatory Visit (HOSPITAL_COMMUNITY): Payer: Self-pay | Admitting: *Deleted

## 2015-09-17 DIAGNOSIS — I4819 Other persistent atrial fibrillation: Secondary | ICD-10-CM

## 2015-09-30 ENCOUNTER — Other Ambulatory Visit: Payer: Self-pay | Admitting: Physician Assistant

## 2015-09-30 ENCOUNTER — Other Ambulatory Visit: Payer: Self-pay | Admitting: Internal Medicine

## 2015-10-01 ENCOUNTER — Telehealth (HOSPITAL_COMMUNITY): Payer: Self-pay | Admitting: *Deleted

## 2015-10-01 NOTE — Telephone Encounter (Signed)
Patient given detailed instructions per Myocardial Perfusion Study Information Sheet for the test on 10/04/15 at 1100. Patient notified to arrive 15 minutes early and that it is imperative to arrive on time for appointment to keep from having the test rescheduled.  If you need to cancel or reschedule your appointment, please call the office within 24 hours of your appointment. Failure to do so may result in a cancellation of your appointment, and a $50 no show fee. Patient verbalized understanding.Kohl Polinsky, [facility]

## 2015-10-04 ENCOUNTER — Ambulatory Visit (HOSPITAL_COMMUNITY): Payer: Medicare Other | Attending: Cardiovascular Disease

## 2015-10-04 ENCOUNTER — Ambulatory Visit (INDEPENDENT_AMBULATORY_CARE_PROVIDER_SITE_OTHER): Payer: Medicare Other | Admitting: Cardiovascular Disease

## 2015-10-04 ENCOUNTER — Other Ambulatory Visit: Payer: Self-pay | Admitting: Cardiovascular Disease

## 2015-10-04 ENCOUNTER — Encounter: Payer: Self-pay | Admitting: Cardiovascular Disease

## 2015-10-04 VITALS — BP 149/90 | HR 91 | Ht 72.0 in | Wt 260.0 lb

## 2015-10-04 DIAGNOSIS — R0789 Other chest pain: Secondary | ICD-10-CM

## 2015-10-04 DIAGNOSIS — Z01810 Encounter for preprocedural cardiovascular examination: Secondary | ICD-10-CM

## 2015-10-04 DIAGNOSIS — R9439 Abnormal result of other cardiovascular function study: Secondary | ICD-10-CM

## 2015-10-04 DIAGNOSIS — R0602 Shortness of breath: Secondary | ICD-10-CM | POA: Diagnosis not present

## 2015-10-04 DIAGNOSIS — I481 Persistent atrial fibrillation: Secondary | ICD-10-CM | POA: Insufficient documentation

## 2015-10-04 DIAGNOSIS — I10 Essential (primary) hypertension: Secondary | ICD-10-CM | POA: Diagnosis not present

## 2015-10-04 DIAGNOSIS — I4819 Other persistent atrial fibrillation: Secondary | ICD-10-CM

## 2015-10-04 LAB — CBC WITH DIFFERENTIAL/PLATELET
BASOS PCT: 0 %
Basophils Absolute: 0 cells/uL (ref 0–200)
Eosinophils Absolute: 83 cells/uL (ref 15–500)
Eosinophils Relative: 1 %
HCT: 47.6 % (ref 38.5–50.0)
Hemoglobin: 16.2 g/dL (ref 13.2–17.1)
LYMPHS PCT: 25 %
Lymphs Abs: 2075 cells/uL (ref 850–3900)
MCH: 33.8 pg — ABNORMAL HIGH (ref 27.0–33.0)
MCHC: 34 g/dL (ref 32.0–36.0)
MCV: 99.4 fL (ref 80.0–100.0)
MONO ABS: 747 {cells}/uL (ref 200–950)
MONOS PCT: 9 %
MPV: 11 fL (ref 7.5–12.5)
Neutro Abs: 5395 cells/uL (ref 1500–7800)
Neutrophils Relative %: 65 %
PLATELETS: 233 10*3/uL (ref 140–400)
RBC: 4.79 MIL/uL (ref 4.20–5.80)
RDW: 15 % (ref 11.0–15.0)
WBC: 8.3 10*3/uL (ref 3.8–10.8)

## 2015-10-04 LAB — MYOCARDIAL PERFUSION IMAGING
CHL CUP NUCLEAR SRS: 5
CHL CUP NUCLEAR SSS: 19
CSEPPHR: 98 {beats}/min
LHR: 0.36
LV dias vol: 141 mL (ref 62–150)
LVSYSVOL: 82 mL
NUC STRESS TID: 1
Rest HR: 86 {beats}/min
SDS: 14

## 2015-10-04 LAB — PROTIME-INR
INR: 1.07 (ref <1.50)
PROTHROMBIN TIME: 14 s (ref 11.6–15.2)

## 2015-10-04 LAB — APTT: APTT: 41 s — AB (ref 24–37)

## 2015-10-04 MED ORDER — TECHNETIUM TC 99M SESTAMIBI GENERIC - CARDIOLITE
32.6000 | Freq: Once | INTRAVENOUS | Status: AC | PRN
  Administered 2015-10-04: 33 via INTRAVENOUS

## 2015-10-04 MED ORDER — TECHNETIUM TC 99M SESTAMIBI GENERIC - CARDIOLITE
10.2000 | Freq: Once | INTRAVENOUS | Status: AC | PRN
  Administered 2015-10-04: 10 via INTRAVENOUS

## 2015-10-04 MED ORDER — REGADENOSON 0.4 MG/5ML IV SOLN
0.4000 mg | Freq: Once | INTRAVENOUS | Status: AC
  Administered 2015-10-04: 0.4 mg via INTRAVENOUS

## 2015-10-04 NOTE — Patient Instructions (Addendum)
Medication Instructions:  Same- no changes  Labwork: Today-BMET, CBCD, INR  Testing/Procedures: Your physician has requested that you have a cardiac catheterization. Cardiac catheterization is used to diagnose and/or treat various heart conditions. Doctors may recommend this procedure for a number of different reasons. The most common reason is to evaluate chest pain. Chest pain can be a symptom of coronary artery disease (CAD), and cardiac catheterization can show whether plaque is narrowing or blocking your heart's arteries. This procedure is also used to evaluate the valves, as well as measure the blood flow and oxygen levels in different parts of your heart. For further information please visit [email or web address]. Please follow instruction sheet, as given.   Follow-Up: After Cath  Any Other Special Instructions Will Be Listed Below (If Applicable). Stop taking Xarelto on Tuesday 10/08/15 Tuesday will be your last dose prior to your Cath)     If you need a refill on your cardiac medications before your next appointment, please call your pharmacy.

## 2015-10-04 NOTE — Progress Notes (Signed)
Patient ID: Christopher Wade, male   DOB: 1951-01-05, 65 y.o.   MRN: [id number]     Cardiology Office Note   Date:  10/04/2015   ID:  Christopher Wade, DOB [birth year], MRN [id number]  PCP:  Alesia Richards, MD  Cardiologist:   Radford Pax.    No chief complaint on file.     History of Present Illness: Christopher Wade is a 65 y.o. male added on to DOD schedule for positive myovue  PAF noted  since June of 2016 picked up by his PCP at time of his physical. He was started on atenolol, Cardizem and xarelto. He did not tolerate the CCB and it was stopped.He underwent DCCV 03/2015 with initial successful cardioversion but had returned to Afib before the left the hosptital.  Seen in afib clinic 4/17 for AAD consideration.   He stated on previous visit ins afib clinic, that being in afib does not really bother him. 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 If really exertional, he will feel winded. He does have sleep apnea and does wear cpap. No alcohol, minimal caffeine, no smoking. He is obese and does not regularly exercise as he says he is very active on the job.He is rate controlled at 78 bpm. Is being compliant with xarelto without bleeding issues. He started Lamisil a few weeks ago for a fungal infection involving his feet. He denies having any exertional chest pain but has never had a stress test. This drug prevented him for starting antiarrythmic drug therapy secondary to possible drug/drug interactions.  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 Hypertension   . Hyperlipidemia   . Diabetes mellitus without complication ([place])   . Thyroid disease   . Vitamin D deficiency   . Allergy   . Arthritis     lumbar  . Atrial fibrillation, persistent ([place])     s/p DCCV 03/2015 with reversion back to atrial fibrillation  . OSA (obstructive sleep apnea)     noncompliant with CPAP    Past Surgical History  Procedure Laterality Date  . Joint replacement Bilateral 2001    Hips  . Cardioversion N/A 04/05/2015    Procedure: CARDIOVERSION;  Surgeon: [name], MD;  Location: [facility];  Service: Cardiovascular;  Laterality: N/A;     Current Outpatient Prescriptions  Medication Sig Dispense Refill  . allopurinol (ZYLOPRIM) 300 MG tablet take 1 tablet by mouth once daily 30 tablet 2  . amLODipine (NORVASC) 5 MG tablet take 1 tablet by mouth twice a day 60 tablet 3  . atenolol (TENORMIN) 100 MG tablet Take 0.5 tablets (50 mg total) by mouth 2 (two) times daily. 90 tablet 1  . cholecalciferol (VITAMIN D) 1000 UNITS tablet Take 1,000 Units by mouth daily.    . colchicine 0.6 MG tablet Take 1  tablet daily to prevent Gout 90 tablet 0  . HYDROcodone-acetaminophen (NORCO/VICODIN) 5-325 MG tablet Take 1 tablet by mouth every 6 (six) hours as needed for moderate pain. 90 tablet 0  . levothyroxine (SYNTHROID, LEVOTHROID) 50 MCG tablet take 1 tablet by mouth once daily 90 tablet 0  . Magnesium 250 MG TABS Take 250 mg by mouth 2 (two) times daily.     . metFORMIN (GLUCOPHAGE) 500 MG tablet take 1 tablet by mouth twice a day with food 60 tablet 3  . nystatin cream (MYCOSTATIN) Apply 1 application topically 2 (two) times daily as needed (wound care). 30 g 1  . Omega-3 Fatty Acids (FISH OIL) 1200 MG CAPS Take 1,200 mg by mouth daily.    . rivaroxaban (XARELTO) 20 MG  TABS tablet Take 1 tablet (20 mg total) by mouth daily with supper. 30 tablet 4  . valsartan (DIOVAN) 320 MG tablet take 1 tablet by mouth once daily 90 tablet 0   No current facility-administered medications for this visit.    Allergies:   Bee venom    Social History:  The patient  reports that he has never smoked. He has never used smokeless tobacco. He reports that he does not drink alcohol or use illicit drugs.   Family History:  The patient's family history includes Cancer (age of onset: 21) in his father; Cancer (age of onset: 6) in his mother; Cancer (age of onset: 14) in his sister.    ROS:  Please see the history of present illness.   Otherwise, review of systems are positive for none.   All other systems are reviewed and negative.    PHYSICAL EXAM: VS:  There were no vitals taken for this visit. , BMI There is no weight on file to calculate BMI. Affect appropriate Healthy:  appears stated age 85: normal Neck supple with no adenopathy JVP normal no bruits no thyromegaly Lungs clear with no wheezing and good diaphragmatic motion Heart:  S1/S2 no murmur, no rub, gallop or click PMI normal Abdomen: benighn, BS positve, no tenderness, no AAA no bruit.  No HSM or HJR Distal pulses intact with no bruits No edema Neuro non-focal Skin warm and dry No muscular weakness    EKG:  afib poor R wave progression non specific ST changes    Recent Labs: 08/29/2015: ALT 23; BUN 20; Creat 1.01; Hemoglobin 14.9; Magnesium 1.6; Platelets 208; Potassium 3.6; Sodium 140; TSH 2.28    Lipid Panel    Component Value Date/Time   CHOL 155 08/29/2015 1714   TRIG 171* 08/29/2015 1714   HDL 36* 08/29/2015 1714   CHOLHDL 4.3 08/29/2015 1714   VLDL 34* 08/29/2015 1714   LDLCALC 85 08/29/2015 1714      Wt Readings from Last 3 Encounters:  09/09/15 118.117 kg (260 lb 6.4 oz)  08/29/15 119.387 kg (263 lb 3.2 oz)  05/23/15 123.741 kg (272 lb 12.8 oz)      Other studies  Reviewed: Additional studies/ records that were reviewed today include: Myovue previous notes Dr Radford Pax and [facility] .    ASSESSMENT AND PLAN:  1.  Chest pain:  With abnormal myovue continue beta blocker 81 mg ASA.  Lab called orders written diagnostic cath 5/19 2. Afib hold xarelto 3 days before cath. If cath shows significant CAD will need tikosyn or amiodarone as AAD 3. DM; Discussed low carb diet.  Target hemoglobin A1c is 6.5 or less.  Continue current medications. Will hold glucophage 48 hrs before cath  Current medicines are reviewed at length with the patient today.  The patient does not have concerns regarding medicines.  The following changes have been made:  no change  Labs/ tests ordered today include: Pre cath labs   No orders of the defined types were placed in this encounter.     Disposition:   FU with Dr Radford Pax post cath      Signed, Jenkins Rouge, MD  10/04/2015 1:54 PM    [facility] [address], [place], [place]  [address] Phone: [phone]; Fax: [phone]

## 2015-10-05 LAB — BASIC METABOLIC PANEL WITH GFR
BUN: 16 mg/dL (ref 7–25)
CO2: 25 mmol/L (ref 20–31)
Calcium: 8.9 mg/dL (ref 8.6–10.3)
Chloride: 100 mmol/L (ref 98–110)
Creat: 0.86 mg/dL (ref 0.70–1.25)
Glucose, Bld: 124 mg/dL — ABNORMAL HIGH (ref 65–99)
Potassium: 4 mmol/L (ref 3.5–5.3)
Sodium: 139 mmol/L (ref 135–146)

## 2015-10-11 ENCOUNTER — Encounter (HOSPITAL_COMMUNITY): Payer: Self-pay | Admitting: *Deleted

## 2015-10-11 ENCOUNTER — Encounter (HOSPITAL_COMMUNITY)
Admission: RE | Disposition: A | Discharge status: Home / Self Care | Payer: Self-pay | Source: Ambulatory Visit | Attending: Cardiology

## 2015-10-11 ENCOUNTER — Inpatient Hospital Stay (HOSPITAL_COMMUNITY)
Admission: RE | Admit: 2015-10-11 | Discharge: 2015-10-15 | DRG: 247 | Disposition: A | Discharge status: Home / Self Care | Payer: Medicare Other | Source: Ambulatory Visit | Attending: Cardiology | Admitting: Cardiology

## 2015-10-11 DIAGNOSIS — I10 Essential (primary) hypertension: Secondary | ICD-10-CM | POA: Diagnosis present

## 2015-10-11 DIAGNOSIS — G4733 Obstructive sleep apnea (adult) (pediatric): Secondary | ICD-10-CM | POA: Diagnosis present

## 2015-10-11 DIAGNOSIS — I480 Paroxysmal atrial fibrillation: Secondary | ICD-10-CM | POA: Diagnosis present

## 2015-10-11 DIAGNOSIS — I2510 Atherosclerotic heart disease of native coronary artery without angina pectoris: Secondary | ICD-10-CM

## 2015-10-11 DIAGNOSIS — I209 Angina pectoris, unspecified: Secondary | ICD-10-CM | POA: Diagnosis not present

## 2015-10-11 DIAGNOSIS — E119 Type 2 diabetes mellitus without complications: Secondary | ICD-10-CM | POA: Diagnosis present

## 2015-10-11 DIAGNOSIS — R9439 Abnormal result of other cardiovascular function study: Secondary | ICD-10-CM | POA: Diagnosis present

## 2015-10-11 DIAGNOSIS — E1122 Type 2 diabetes mellitus with diabetic chronic kidney disease: Secondary | ICD-10-CM | POA: Diagnosis present

## 2015-10-11 DIAGNOSIS — N182 Chronic kidney disease, stage 2 (mild): Secondary | ICD-10-CM

## 2015-10-11 DIAGNOSIS — E785 Hyperlipidemia, unspecified: Secondary | ICD-10-CM | POA: Diagnosis present

## 2015-10-11 DIAGNOSIS — R931 Abnormal findings on diagnostic imaging of heart and coronary circulation: Secondary | ICD-10-CM | POA: Diagnosis not present

## 2015-10-11 DIAGNOSIS — I25119 Atherosclerotic heart disease of native coronary artery with unspecified angina pectoris: Principal | ICD-10-CM | POA: Diagnosis present

## 2015-10-11 DIAGNOSIS — E1169 Type 2 diabetes mellitus with other specified complication: Secondary | ICD-10-CM | POA: Diagnosis present

## 2015-10-11 DIAGNOSIS — Z955 Presence of coronary angioplasty implant and graft: Secondary | ICD-10-CM

## 2015-10-11 DIAGNOSIS — Z7984 Long term (current) use of oral hypoglycemic drugs: Secondary | ICD-10-CM | POA: Diagnosis not present

## 2015-10-11 DIAGNOSIS — R079 Chest pain, unspecified: Secondary | ICD-10-CM | POA: Diagnosis not present

## 2015-10-11 DIAGNOSIS — I481 Persistent atrial fibrillation: Secondary | ICD-10-CM | POA: Diagnosis not present

## 2015-10-11 HISTORY — PX: CARDIAC CATHETERIZATION: SHX172

## 2015-10-11 LAB — POCT ACTIVATED CLOTTING TIME: Activated Clotting Time: 430 seconds

## 2015-10-11 LAB — GLUCOSE, CAPILLARY
GLUCOSE-CAPILLARY: 120 mg/dL — AB (ref 65–99)
GLUCOSE-CAPILLARY: 111 mg/dL — AB (ref 65–99)
Glucose-Capillary: 127 mg/dL — ABNORMAL HIGH (ref 65–99)

## 2015-10-11 LAB — HEPARIN LEVEL (UNFRACTIONATED)

## 2015-10-11 LAB — MRSA PCR SCREENING: MRSA BY PCR: NEGATIVE

## 2015-10-11 LAB — APTT: APTT: 55 s — AB (ref 24–37)

## 2015-10-11 SURGERY — LEFT HEART CATH AND CORONARY ANGIOGRAPHY
Anesthesia: LOCAL

## 2015-10-11 MED ORDER — LIDOCAINE HCL (PF) 1 % IJ SOLN
INTRAMUSCULAR | Status: DC | PRN
  Administered 2015-10-11: 2 mL via INTRADERMAL

## 2015-10-11 MED ORDER — SODIUM CHLORIDE 0.9% FLUSH: 3.0000 mL | Freq: Two times a day (BID) | INTRAVENOUS | Status: DC

## 2015-10-11 MED ORDER — ASPIRIN 81 MG PO CHEW
CHEWABLE_TABLET | ORAL | Status: AC
  Filled 2015-10-11: qty 1

## 2015-10-11 MED ORDER — MIDAZOLAM HCL 2 MG/2ML IJ SOLN
INTRAMUSCULAR | Status: AC
  Filled 2015-10-11: qty 2

## 2015-10-11 MED ORDER — ADENOSINE (DIAGNOSTIC) 140MCG/KG/MIN
INTRAVENOUS | Status: DC | PRN
  Administered 2015-10-11: 140 ug/kg/min via INTRAVENOUS

## 2015-10-11 MED ORDER — LORATADINE 10 MG PO TABS
10.0000 mg | ORAL_TABLET | Freq: Every day | ORAL | Status: DC
Start: 2015-10-12 — End: 2015-10-15
  Administered 2015-10-12 – 2015-10-15 (×4): 10 mg via ORAL
  Filled 2015-10-11 (×4): qty 1

## 2015-10-11 MED ORDER — ATORVASTATIN CALCIUM 80 MG PO TABS
80.0000 mg | ORAL_TABLET | Freq: Every day | ORAL | Status: DC
  Administered 2015-10-11 – 2015-10-14 (×4): 80 mg via ORAL
  Filled 2015-10-11 (×4): qty 1

## 2015-10-11 MED ORDER — SODIUM CHLORIDE 0.9 % WEIGHT BASED INFUSION
3.0000 mL/kg/h | INTRAVENOUS | Status: DC
  Administered 2015-10-11: 3 mL/kg/h via INTRAVENOUS

## 2015-10-11 MED ORDER — BIVALIRUDIN 250 MG IV SOLR
INTRAVENOUS | Status: AC
  Filled 2015-10-11: qty 250

## 2015-10-11 MED ORDER — IRBESARTAN 300 MG PO TABS
300.0000 mg | ORAL_TABLET | Freq: Every day | ORAL | Status: DC
  Administered 2015-10-12 – 2015-10-15 (×4): 300 mg via ORAL
  Filled 2015-10-11 (×4): qty 1

## 2015-10-11 MED ORDER — IOPAMIDOL (ISOVUE-370) INJECTION 76%
INTRAVENOUS | Status: DC | PRN
  Administered 2015-10-11: 230 mL via INTRA_ARTERIAL

## 2015-10-11 MED ORDER — HEPARIN SODIUM (PORCINE) 1000 UNIT/ML IJ SOLN
INTRAMUSCULAR | Status: DC | PRN
  Administered 2015-10-11: 5000 [IU] via INTRAVENOUS

## 2015-10-11 MED ORDER — NITROGLYCERIN 1 MG/10 ML FOR IR/CATH LAB
INTRA_ARTERIAL | Status: DC | PRN
  Administered 2015-10-11: 200 ug via INTRACORONARY

## 2015-10-11 MED ORDER — FENTANYL CITRATE (PF) 100 MCG/2ML IJ SOLN
INTRAMUSCULAR | Status: DC | PRN
  Administered 2015-10-11: 25 ug via INTRAVENOUS

## 2015-10-11 MED ORDER — IOPAMIDOL (ISOVUE-370) INJECTION 76%
INTRAVENOUS | Status: AC
  Filled 2015-10-11: qty 100

## 2015-10-11 MED ORDER — HEPARIN SODIUM (PORCINE) 1000 UNIT/ML IJ SOLN
INTRAMUSCULAR | Status: AC
  Filled 2015-10-11: qty 1

## 2015-10-11 MED ORDER — IOPAMIDOL (ISOVUE-370) INJECTION 76%
INTRAVENOUS | Status: AC
  Filled 2015-10-11: qty 50

## 2015-10-11 MED ORDER — MAGNESIUM GLUCONATE 500 MG PO TABS
500.0000 mg | ORAL_TABLET | Freq: Three times a day (TID) | ORAL | Status: DC
  Administered 2015-10-11 – 2015-10-15 (×12): 500 mg via ORAL
  Filled 2015-10-11 (×13): qty 1

## 2015-10-11 MED ORDER — FENTANYL CITRATE (PF) 100 MCG/2ML IJ SOLN
INTRAMUSCULAR | Status: AC
  Filled 2015-10-11: qty 2

## 2015-10-11 MED ORDER — SODIUM CHLORIDE 0.9 % WEIGHT BASED INFUSION: 1.0000 mL/kg/h | INTRAVENOUS | Status: DC

## 2015-10-11 MED ORDER — NITROGLYCERIN 1 MG/10 ML FOR IR/CATH LAB
INTRA_ARTERIAL | Status: AC
  Filled 2015-10-11: qty 10

## 2015-10-11 MED ORDER — CLOPIDOGREL BISULFATE 75 MG PO TABS
75.0000 mg | ORAL_TABLET | Freq: Every day | ORAL | Status: DC
  Administered 2015-10-12 – 2015-10-15 (×4): 75 mg via ORAL
  Filled 2015-10-11 (×4): qty 1

## 2015-10-11 MED ORDER — ATENOLOL 50 MG PO TABS
50.0000 mg | ORAL_TABLET | Freq: Two times a day (BID) | ORAL | Status: DC
  Administered 2015-10-11: 50 mg via ORAL
  Filled 2015-10-11 (×2): qty 1

## 2015-10-11 MED ORDER — CLOPIDOGREL BISULFATE 300 MG PO TABS
ORAL_TABLET | ORAL | Status: DC | PRN
  Administered 2015-10-11: 600 mg via ORAL

## 2015-10-11 MED ORDER — BIVALIRUDIN BOLUS VIA INFUSION - CUPID
INTRAVENOUS | Status: DC | PRN
  Administered 2015-10-11: 88.425 mg via INTRAVENOUS

## 2015-10-11 MED ORDER — ACETAMINOPHEN 325 MG PO TABS: 650.0000 mg | ORAL_TABLET | ORAL | Status: DC | PRN

## 2015-10-11 MED ORDER — ONDANSETRON HCL 4 MG/2ML IJ SOLN: 4.0000 mg | Freq: Four times a day (QID) | INTRAMUSCULAR | Status: DC | PRN

## 2015-10-11 MED ORDER — BIVALIRUDIN 250 MG IV SOLR
250.0000 mg | INTRAVENOUS | Status: DC | PRN
  Administered 2015-10-11 (×2): 1.75 mg/kg/h via INTRAVENOUS

## 2015-10-11 MED ORDER — VERAPAMIL HCL 2.5 MG/ML IV SOLN
INTRAVENOUS | Status: DC | PRN
  Administered 2015-10-11: 10 mL via INTRA_ARTERIAL

## 2015-10-11 MED ORDER — ASPIRIN 81 MG PO CHEW
81.0000 mg | CHEWABLE_TABLET | ORAL | Status: AC
  Administered 2015-10-11: 81 mg via ORAL

## 2015-10-11 MED ORDER — SODIUM CHLORIDE 0.9 % IV SOLN: 250.0000 mL | INTRAVENOUS | Status: DC | PRN

## 2015-10-11 MED ORDER — AMLODIPINE BESYLATE 5 MG PO TABS
5.0000 mg | ORAL_TABLET | Freq: Two times a day (BID) | ORAL | Status: DC
  Administered 2015-10-11 – 2015-10-15 (×8): 5 mg via ORAL
  Filled 2015-10-11 (×8): qty 1

## 2015-10-11 MED ORDER — HYDROCODONE-ACETAMINOPHEN 5-325 MG PO TABS: 1.0000 | ORAL_TABLET | Freq: Four times a day (QID) | ORAL | Status: DC | PRN

## 2015-10-11 MED ORDER — HEPARIN (PORCINE) IN NACL 100-0.45 UNIT/ML-% IJ SOLN
1600.0000 [IU]/h | INTRAMUSCULAR | Status: DC
  Administered 2015-10-11: 1550 [IU]/h via INTRAVENOUS
  Administered 2015-10-12 – 2015-10-13 (×2): 1750 [IU]/h via INTRAVENOUS
  Filled 2015-10-11 (×5): qty 250

## 2015-10-11 MED ORDER — VITAMIN D 1000 UNITS PO TABS
1000.0000 [IU] | ORAL_TABLET | Freq: Every day | ORAL | Status: DC
  Administered 2015-10-12 – 2015-10-15 (×4): 1000 [IU] via ORAL
  Filled 2015-10-11 (×5): qty 1

## 2015-10-11 MED ORDER — MIDAZOLAM HCL 2 MG/2ML IJ SOLN
INTRAMUSCULAR | Status: DC | PRN
  Administered 2015-10-11: 1 mg via INTRAVENOUS
  Administered 2015-10-11: 2 mg via INTRAVENOUS

## 2015-10-11 MED ORDER — ASPIRIN 81 MG PO CHEW
81.0000 mg | CHEWABLE_TABLET | Freq: Every day | ORAL | Status: DC
Start: 2015-10-12 — End: 2015-10-14
  Administered 2015-10-12 – 2015-10-13 (×2): 81 mg via ORAL
  Filled 2015-10-11 (×2): qty 1

## 2015-10-11 MED ORDER — SODIUM CHLORIDE 0.9% FLUSH
3.0000 mL | Freq: Two times a day (BID) | INTRAVENOUS | Status: DC
  Administered 2015-10-11 – 2015-10-14 (×5): 3 mL via INTRAVENOUS

## 2015-10-11 MED ORDER — SODIUM CHLORIDE 0.9% FLUSH: 3.0000 mL | INTRAVENOUS | Status: DC | PRN

## 2015-10-11 MED ORDER — VERAPAMIL HCL 2.5 MG/ML IV SOLN
INTRAVENOUS | Status: AC
  Filled 2015-10-11: qty 2

## 2015-10-11 MED ORDER — LEVOTHYROXINE SODIUM 50 MCG PO TABS
50.0000 ug | ORAL_TABLET | Freq: Every day | ORAL | Status: DC
  Administered 2015-10-12 – 2015-10-15 (×4): 50 ug via ORAL
  Filled 2015-10-11 (×4): qty 1

## 2015-10-11 MED ORDER — HEPARIN (PORCINE) IN NACL 2-0.9 UNIT/ML-% IJ SOLN
INTRAMUSCULAR | Status: AC
  Filled 2015-10-11: qty 1000

## 2015-10-11 MED ORDER — CLOPIDOGREL BISULFATE 300 MG PO TABS
ORAL_TABLET | ORAL | Status: AC
  Filled 2015-10-11: qty 2

## 2015-10-11 MED ORDER — HEPARIN (PORCINE) IN NACL 2-0.9 UNIT/ML-% IJ SOLN
INTRAMUSCULAR | Status: DC | PRN
  Administered 2015-10-11: 1000 mL

## 2015-10-11 MED ORDER — LIDOCAINE HCL (PF) 1 % IJ SOLN
INTRAMUSCULAR | Status: AC
  Filled 2015-10-11: qty 30

## 2015-10-11 MED ORDER — ALLOPURINOL 300 MG PO TABS
300.0000 mg | ORAL_TABLET | Freq: Every day | ORAL | Status: DC
  Administered 2015-10-12 – 2015-10-15 (×4): 300 mg via ORAL
  Filled 2015-10-11 (×4): qty 1

## 2015-10-11 MED ORDER — OMEGA-3-ACID ETHYL ESTERS 1 G PO CAPS
1.0000 g | ORAL_CAPSULE | Freq: Every day | ORAL | Status: DC
  Administered 2015-10-12 – 2015-10-15 (×4): 1 g via ORAL
  Filled 2015-10-11 (×4): qty 1

## 2015-10-11 MED ORDER — ADENOSINE (DIAGNOSTIC) 3 MG/ML IV SOLN
20.0000 mL | INTRAVENOUS | Status: DC
  Filled 2015-10-11: qty 20

## 2015-10-11 SURGICAL SUPPLY — 21 items
BALLN EMERGE MR 2.5X15 (BALLOONS) ×2
BALLN ~~LOC~~ EMERGE MR 4.0X20 (BALLOONS) ×2
BALLOON EMERGE MR 2.5X15 (BALLOONS) ×1 IMPLANT
BALLOON ~~LOC~~ EMERGE MR 4.0X20 (BALLOONS) ×1 IMPLANT
CATH INFINITI 5 FR JL3.5 (CATHETERS) ×2 IMPLANT
CATH INFINITI 5FR ANG PIGTAIL (CATHETERS) ×2 IMPLANT
CATH INFINITI JR4 5F (CATHETERS) ×2 IMPLANT
CATH VISTA GUIDE 6FR H STICK (CATHETERS) ×2 IMPLANT
CATH VISTA GUIDE 6FR XBLAD3.5 (CATHETERS) ×2 IMPLANT
GLIDESHEATH SLEND SS 6F .021 (SHEATH) ×2 IMPLANT
GUIDEWIRE PRESSURE COMET II (WIRE) ×2 IMPLANT
KIT ENCORE 26 ADVANTAGE (KITS) ×2 IMPLANT
KIT HEART LEFT (KITS) ×2 IMPLANT
PACK CARDIAC CATHETERIZATION (CUSTOM PROCEDURE TRAY) ×2 IMPLANT
STENT PROMUS PREM MR 3.5X12 (Permanent Stent) ×2 IMPLANT
STENT PROMUS PREM MR 3.5X24 (Permanent Stent) ×2 IMPLANT
SYR MEDRAD MARK V 150ML (SYRINGE) ×2 IMPLANT
TRANSDUCER W/STOPCOCK (MISCELLANEOUS) ×2 IMPLANT
TUBING CIL FLEX 10 FLL-RA (TUBING) ×2 IMPLANT
WIRE ASAHI PROWATER 180CM (WIRE) ×2 IMPLANT
WIRE SAFE-T 1.5MM-J .035X260CM (WIRE) ×2 IMPLANT

## 2015-10-11 NOTE — CV Procedure (Signed)
See full note under procedures  Jenkins Rouge

## 2015-10-11 NOTE — Interval H&P Note (Signed)
History and Physical Interval Note:  10/11/2015 10:01 AM  Christopher Wade  has presented today for surgery, with the diagnosis of abn myoview    cp  The various methods of treatment have been discussed with the patient and family. After consideration of risks, benefits and other options for treatment, the patient has consented to  Procedure(s): Left Heart Cath and Coronary Angiography (N/A) as a surgical intervention .  The patient's history has been reviewed, patient examined, no change in status, stable for surgery.  I have reviewed the patient's chart and labs.  Questions were answered to the patient's satisfaction.     Jenkins Rouge

## 2015-10-11 NOTE — Progress Notes (Addendum)
ANTICOAGULATION CONSULT NOTE - Initial Consult  Pharmacy Consult for heparin Indication: chest pain/ACS  Allergies  Allergen Reactions  . Bee Venom Other (See Comments)    Leg went numb    Patient Measurements: Height: 6' (182.9 cm) Weight: 260 lb (117.935 kg) IBW/kg (Calculated) : 77.6 Heparin Dosing Weight: 103.3kg  Vital Signs: Temp: 98.4 F (36.9 C) (05/19 1300) Temp Source: Oral (05/19 1300) BP: 154/84 mmHg (05/19 1300) Pulse Rate: 73 (05/19 1300)  Labs: No results for input(s): HGB, HCT, PLT, APTT, LABPROT, INR, HEPARINUNFRC, HEPRLOWMOCWT, CREATININE, CKTOTAL, CKMB, TROPONINI in the last 72 hours.  Estimated Creatinine Clearance: 113.5 mL/min (by C-G formula based on Cr of 0.86).   Medical History: Past Medical History  Diagnosis Date  . Hypertension   . Hyperlipidemia   . Diabetes mellitus without complication ([place])   . Thyroid disease   . Vitamin D deficiency   . Allergy   . Arthritis     lumbar  . Atrial fibrillation, persistent ([place])     s/p DCCV 03/2015 with reversion back to atrial fibrillation  . OSA (obstructive sleep apnea)     noncompliant with CPAP    Assessment: 4 yom s/p cath on 5/19. Successful stenting of mid RCA w/ DES x2, will start heparin over weekend and return to cath lab Monday for stenting of LCx/OM bifurcation. Pharmacy consulted to dose heparin 8 hours post-sheath removal. Patient is on Xarelto pta for afib (on hold). CBC ok on 5/12, no bleed documented. Baseline HL <0.1. Baseline aPTT 55 elevated from cath.  Radial cath - sheath removed at 1215. TR band removed 1700  Last dose of Xarelto 5/16 pta. Will check baseline aPTT/HL  Goal of Therapy:  Heparin level 0.3-0.7 units/ml Monitor platelets by anticoagulation protocol: Yes   Plan:  Heparin IV to start 8h post-sheath removal at 8PM at 1550 units/hr. Daily HL/aPTT/CBC Monitor s/sx bleeding Hold Xarelto, cath 5/22   Aminah Zabawa S. Alford Highland, PharmD, [facility] Clinical Staff  Pharmacist Pager [phone]   10/11/2015 1:21 PM

## 2015-10-11 NOTE — Interval H&P Note (Signed)
History and Physical Interval Note:  10/11/2015 11:30 AM  Christopher Wade  has presented today for surgery, with the diagnosis of abn myoview    cp  The various methods of treatment have been discussed with the patient and family. After consideration of risks, benefits and other options for treatment, the patient has consented to  Procedure(s): Left Heart Cath and Coronary Angiography (N/A) Coronary Stent Intervention (N/A) as a surgical intervention .  The patient's history has been reviewed, patient examined, no change in status, stable for surgery.  I have reviewed the patient's chart and labs.  Questions were answered to the patient's satisfaction.    Cath Lab Visit (complete for each Cath Lab visit)  Clinical Evaluation Leading to the Procedure:   ACS: No.  Non-ACS:    Anginal Classification: CCS II  Anti-ischemic medical therapy: Maximal Therapy (2 or more classes of medications)  Non-Invasive Test Results: High-risk stress test findings: cardiac mortality >3%/year  Prior CABG: No previous CABG       [name] [facility] 10/11/2015 11:30 AM

## 2015-10-11 NOTE — H&P (View-Only) (Signed)
Patient ID: Christopher Wade, male   DOB: [birth year], 65 y.o.   MRN: [id number]     Cardiology Office Note   Date:  10/04/2015   ID:  Christopher Wade, DOB [birth year], MRN [id number]  PCP:  Alesia Richards, MD  Cardiologist:   Radford Pax.    No chief complaint on file.     History of Present Illness: Christopher Wade is a 65 y.o. male added on to DOD schedule for positive myovue  PAF noted  since June of 2016 picked up by his PCP at time of his physical. He was started on atenolol, Cardizem and xarelto. He did not tolerate the CCB and it was stopped.He underwent DCCV 03/2015 with initial successful cardioversion but had returned to Afib before the left the hosptital.  Seen in afib clinic 4/17 for AAD consideration.   He stated on previous visit ins afib clinic, that being in afib does not really bother him. 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 If really exertional, he will feel winded. He does have sleep apnea and does wear cpap. No alcohol, minimal caffeine, no smoking. He is obese and does not regularly exercise as he says he is very active on the job.He is rate controlled at 78 bpm. Is being compliant with xarelto without bleeding issues. He started Lamisil a few weeks ago for a fungal infection involving his feet. He denies having any exertional chest pain but has never had a stress test. This drug prevented him for starting antiarrythmic drug therapy secondary to possible drug/drug interactions.  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 Hypertension   . Hyperlipidemia   . Diabetes mellitus without complication ([place])   . Thyroid disease   . Vitamin D deficiency   . Allergy   . Arthritis     lumbar  . Atrial fibrillation, persistent ([place])     s/p DCCV 03/2015 with reversion back to atrial fibrillation  . OSA (obstructive sleep apnea)     noncompliant with CPAP    Past Surgical History  Procedure Laterality Date  . Joint replacement Bilateral 2001    Hips  . Cardioversion N/A 04/05/2015    Procedure: CARDIOVERSION;  Surgeon: [name], MD;  Location: [facility];  Service: Cardiovascular;  Laterality: N/A;     Current Outpatient Prescriptions  Medication Sig Dispense Refill  . allopurinol (ZYLOPRIM) 300 MG tablet take 1 tablet by mouth once daily 30 tablet 2  . amLODipine (NORVASC) 5 MG tablet take 1 tablet by mouth twice a day 60 tablet 3  . atenolol (TENORMIN) 100 MG tablet Take 0.5 tablets (50 mg total) by mouth 2 (two) times daily. 90 tablet 1  . cholecalciferol (VITAMIN D) 1000 UNITS tablet Take 1,000 Units by mouth daily.    . colchicine 0.6 MG tablet Take 1  tablet daily to prevent Gout 90 tablet 0  . HYDROcodone-acetaminophen (NORCO/VICODIN) 5-325 MG tablet Take 1 tablet by mouth every 6 (six) hours as needed for moderate pain. 90 tablet 0  . levothyroxine (SYNTHROID, LEVOTHROID) 50 MCG tablet take 1 tablet by mouth once daily 90 tablet 0  . Magnesium 250 MG TABS Take 250 mg by mouth 2 (two) times daily.     . metFORMIN (GLUCOPHAGE) 500 MG tablet take 1 tablet by mouth twice a day with food 60 tablet 3  . nystatin cream (MYCOSTATIN) Apply 1 application topically 2 (two) times daily as needed (wound care). 30 g 1  . Omega-3 Fatty Acids (FISH OIL) 1200 MG CAPS Take 1,200 mg by mouth daily.    . rivaroxaban (XARELTO) 20 MG  TABS tablet Take 1 tablet (20 mg total) by mouth daily with supper. 30 tablet 4  . valsartan (DIOVAN) 320 MG tablet take 1 tablet by mouth once daily 90 tablet 0   No current facility-administered medications for this visit.    Allergies:   Bee venom    Social History:  The patient  reports that he has never smoked. He has never used smokeless tobacco. He reports that he does not drink alcohol or use illicit drugs.   Family History:  The patient's family history includes Cancer (age of onset: 27) in his father; Cancer (age of onset: 11) in his mother; Cancer (age of onset: 72) in his sister.    ROS:  Please see the history of present illness.   Otherwise, review of systems are positive for none.   All other systems are reviewed and negative.    PHYSICAL EXAM: VS:  There were no vitals taken for this visit. , BMI There is no weight on file to calculate BMI. Affect appropriate Healthy:  appears stated age 38: normal Neck supple with no adenopathy JVP normal no bruits no thyromegaly Lungs clear with no wheezing and good diaphragmatic motion Heart:  S1/S2 no murmur, no rub, gallop or click PMI normal Abdomen: benighn, BS positve, no tenderness, no AAA no bruit.  No HSM or HJR Distal pulses intact with no bruits No edema Neuro non-focal Skin warm and dry No muscular weakness    EKG:  afib poor R wave progression non specific ST changes    Recent Labs: 08/29/2015: ALT 23; BUN 20; Creat 1.01; Hemoglobin 14.9; Magnesium 1.6; Platelets 208; Potassium 3.6; Sodium 140; TSH 2.28    Lipid Panel    Component Value Date/Time   CHOL 155 08/29/2015 1714   TRIG 171* 08/29/2015 1714   HDL 36* 08/29/2015 1714   CHOLHDL 4.3 08/29/2015 1714   VLDL 34* 08/29/2015 1714   LDLCALC 85 08/29/2015 1714      Wt Readings from Last 3 Encounters:  09/09/15 118.117 kg (260 lb 6.4 oz)  08/29/15 119.387 kg (263 lb 3.2 oz)  05/23/15 123.741 kg (272 lb 12.8 oz)      Other studies  Reviewed: Additional studies/ records that were reviewed today include: Myovue previous notes Dr Radford Pax and [facility] .    ASSESSMENT AND PLAN:  1.  Chest pain:  With abnormal myovue continue beta blocker 81 mg ASA.  Lab called orders written diagnostic cath 5/19 2. Afib hold xarelto 3 days before cath. If cath shows significant CAD will need tikosyn or amiodarone as AAD 3. DM; Discussed low carb diet.  Target hemoglobin A1c is 6.5 or less.  Continue current medications. Will hold glucophage 48 hrs before cath  Current medicines are reviewed at length with the patient today.  The patient does not have concerns regarding medicines.  The following changes have been made:  no change  Labs/ tests ordered today include: Pre cath labs   No orders of the defined types were placed in this encounter.     Disposition:   FU with Dr Radford Pax post cath      Signed, Jenkins Rouge, MD  10/04/2015 1:54 PM    [facility] [address], [place], [place]  [address] Phone: [phone]; Fax: [phone]

## 2015-10-11 NOTE — Progress Notes (Signed)
Post cath PCI EKG performed   Christopher Wade

## 2015-10-12 DIAGNOSIS — R931 Abnormal findings on diagnostic imaging of heart and coronary circulation: Secondary | ICD-10-CM

## 2015-10-12 DIAGNOSIS — I481 Persistent atrial fibrillation: Secondary | ICD-10-CM

## 2015-10-12 DIAGNOSIS — I209 Angina pectoris, unspecified: Secondary | ICD-10-CM

## 2015-10-12 LAB — BASIC METABOLIC PANEL
Anion gap: 14 (ref 5–15)
BUN: 10 mg/dL (ref 6–20)
CHLORIDE: 103 mmol/L (ref 101–111)
CO2: 23 mmol/L (ref 22–32)
CREATININE: 0.87 mg/dL (ref 0.61–1.24)
Calcium: 8.8 mg/dL — ABNORMAL LOW (ref 8.9–10.3)
GFR calc non Af Amer: >60 mL/min (ref >60)
GLUCOSE: 114 mg/dL — AB (ref 65–99)
Potassium: 3.6 mmol/L (ref 3.5–5.1)
Sodium: 140 mmol/L (ref 135–145)

## 2015-10-12 LAB — GLUCOSE, CAPILLARY: GLUCOSE-CAPILLARY: 125 mg/dL — AB (ref 65–99)

## 2015-10-12 LAB — HEPARIN LEVEL (UNFRACTIONATED)
HEPARIN UNFRACTIONATED: 0.28 [IU]/mL — AB (ref 0.30–0.70)
Heparin Unfractionated: 0.37 IU/mL (ref 0.30–0.70)

## 2015-10-12 MED ORDER — ANGIOPLASTY BOOK
Freq: Once | Status: AC
  Administered 2015-10-12: 21:00:00
  Filled 2015-10-12: qty 1

## 2015-10-12 MED ORDER — ATENOLOL 50 MG PO TABS
50.0000 mg | ORAL_TABLET | Freq: Three times a day (TID) | ORAL | Status: DC
  Administered 2015-10-12 – 2015-10-15 (×10): 50 mg via ORAL
  Filled 2015-10-12 (×11): qty 1

## 2015-10-12 NOTE — Progress Notes (Signed)
ANTICOAGULATION CONSULT NOTE - Follow Up Consult  Pharmacy Consult for heparin Indication: atrial fibrillation   Labs:  Recent Labs  10/11/15 1545 10/12/15 0234  APTT 55*  --   HEPARINUNFRC <0.10* 0.37    Assessment/Plan:  65yo male therapeutic on heparin after resumed post-cath. Will continue gtt at current rate and confirm stable with additional level; f/u plan to resume Xarelto.   Wynona Neat, PharmD, BCPS  10/12/2015,3:32 AM

## 2015-10-12 NOTE — Progress Notes (Signed)
ANTICOAGULATION CONSULT NOTE - Follow-up Consult  Pharmacy Consult for heparin Indication: chest pain/ACS  Allergies  Allergen Reactions  . Bee Venom Other (See Comments)    Leg went numb    Patient Measurements: Height: 6' (182.9 cm) Weight: 260 lb (117.935 kg) IBW/kg (Calculated) : 77.6 Heparin Dosing Weight: 103.3kg  Vital Signs: Temp: 98.1 F (36.7 C) (05/20 0804) Temp Source: Oral (05/20 0804) BP: 152/90 mmHg (05/20 0804) Pulse Rate: 88 (05/20 0804)  Labs:  Recent Labs  10/11/15 1545 10/12/15 0234 10/12/15 1024  APTT 55*  --   --   HEPARINUNFRC <0.10* 0.37 0.28*  CREATININE  --  0.87  --     Estimated Creatinine Clearance: 112.2 mL/min (by C-G formula based on Cr of 0.87).   Medical History: Past Medical History  Diagnosis Date  . Hypertension   . Hyperlipidemia   . Diabetes mellitus without complication ([place])   . Thyroid disease   . Vitamin D deficiency   . Allergy   . Arthritis     lumbar  . Atrial fibrillation, persistent ([place])     s/p DCCV 03/2015 with reversion back to atrial fibrillation  . OSA (obstructive sleep apnea)     noncompliant with CPAP    Assessment: 23 yom s/p cath on 5/19. Successful stenting of mid RCA w/ DES x2, will start heparin over weekend and return to cath lab Monday for stenting of LCx/OM bifurcation. Pharmacy consulted to dose heparin. HL slightly sub-therapeutic at 0.28.   Last dose of Xarelto 5/16 pta.  Goal of Therapy:  Heparin level 0.3-0.7 units/ml Monitor platelets by anticoagulation protocol: Yes   Plan:  Increase heparin infusion rate to 1750 units/hr Daily HL/aPTT/CBC   Monitor s/sx bleeding   Melburn Popper, PharmD Clinical Pharmacy Resident Pager: [phone] 10/12/2015 11:54 AM

## 2015-10-12 NOTE — Progress Notes (Signed)
CARDIAC REHAB PHASE I   PRE:  Rate/Rhythm: 102 Afib  BP:  Supine: 164/98 Sitting:   Standing:    SaO2: 96% RA  MODE:  Ambulation: 150 ft   POST:  Rate/Rhythm: 120 Afib  BP:  Supine:   Sitting: 146/98  Standing:    SaO2: 97% RA  [id number] Assisted patient to bathroom then ambulation. Pt ambulated 150 ft with assist x1, gait slow, steady. Pt denies CP, denies SOB, c/o feeling hot/flush towards the end of walk. BP elevated prior to and after ambulation. To bed after walk, IV intact, call bell within reach.  Sol Passer, MS, ACSM CCEP

## 2015-10-12 NOTE — Progress Notes (Signed)
Patient ID: Christopher Wade, male   DOB: 1950-05-26, 65 y.o.   MRN: [id number]    Subjective:  Denies SSCP, palpitations or Dyspnea   Objective:  Filed Vitals:   10/12/15 0400 10/12/15 0500 10/12/15 0600 10/12/15 0700  BP: 146/74 142/83 143/100 139/102  Pulse: 65 80 88 62  Temp:      TempSrc:      Resp: 22 22 21 23   Height:      Weight:      SpO2: 94% 94% 96% 95%    Intake/Output from previous day:  Intake/Output Summary (Last 24 hours) at 10/12/15 0739 Last data filed at 10/12/15 0700  Gross per 24 hour  Intake 2336.54 ml  Output   2125 ml  Net 211.54 ml    Physical Exam: Affect appropriate Healthy:  appears stated age HEENT: normal Neck supple with no adenopathy JVP normal no bruits no thyromegaly Lungs clear with no wheezing and good diaphragmatic motion Heart:  S1/S2 no murmur, no rub, gallop or click PMI normal Abdomen: benighn, BS positve, no tenderness, no AAA no bruit.  No HSM or HJR Distal pulses intact with no bruits No edema Neuro non-focal Skin warm and dry No muscular weakness Right radial cath sight A   Lab Results: Basic Metabolic Panel:  Recent Labs  10/12/15 0234  NA 140  K 3.6  CL 103  CO2 23  GLUCOSE 114*  BUN 10  CREATININE 0.87  CALCIUM 8.8*     Imaging: No results found.  Cardiac Studies:  ECG:  afib LAD old IMI no acute ST chagnes    Telemetry:  afib rates 90-110  Echo:   Medications:   . allopurinol  300 mg Oral Daily  . amLODipine  5 mg Oral BID  . aspirin  81 mg Oral Daily  . atenolol  50 mg Oral TID  . atorvastatin  80 mg Oral q1800  . cholecalciferol  1,000 Units Oral Daily  . clopidogrel  75 mg Oral Q breakfast  . irbesartan  300 mg Oral Daily  . levothyroxine  50 mcg Oral QAC breakfast  . loratadine  10 mg Oral Daily  . magnesium gluconate  500 mg Oral TID  . omega-3 acid ethyl esters  1 g Oral Daily  . sodium chloride flush  3 mL Intravenous Q12H     . heparin 1,550 Units/hr (10/12/15 0700)     Assessment/Plan:  CAD:  Post RCA DES for staged intervention to complicated bifurcation circumflex on Monday continue heparin and DAT Afib:  Increase lopressor for rate control failed [facility] will need to initiate tikosyn / amiodarone after coronary interventions Would have EP see on Monday.  Continue heparin over weekend  Jenkins Rouge 10/12/2015, 7:39 AM

## 2015-10-13 DIAGNOSIS — E785 Hyperlipidemia, unspecified: Secondary | ICD-10-CM

## 2015-10-13 DIAGNOSIS — I10 Essential (primary) hypertension: Secondary | ICD-10-CM

## 2015-10-13 LAB — CBC
HCT: 45 % (ref 39.0–52.0)
HEMOGLOBIN: 15.1 g/dL (ref 13.0–17.0)
MCH: 32.8 pg (ref 26.0–34.0)
MCHC: 33.6 g/dL (ref 30.0–36.0)
MCV: 97.6 fL (ref 78.0–100.0)
PLATELETS: 212 10*3/uL (ref 150–400)
RBC: 4.61 MIL/uL (ref 4.22–5.81)
RDW: 14.2 % (ref 11.5–15.5)
WBC: 8.4 10*3/uL (ref 4.0–10.5)

## 2015-10-13 LAB — HEPARIN LEVEL (UNFRACTIONATED): HEPARIN UNFRACTIONATED: 0.63 [IU]/mL (ref 0.30–0.70)

## 2015-10-13 NOTE — Progress Notes (Signed)
Patient ID: Christopher Wade, male   DOB: 1950-12-13, 65 y.o.   MRN: [id number]    Subjective:  Denies SSCP, palpitations or Dyspnea   Objective:  Filed Vitals:   10/12/15 1916 10/12/15 2000 10/12/15 2332 10/13/15 0347  BP: 122/72  130/86 125/71  Pulse: 77 89 80 67  Temp: 98.2 F (36.8 C)  97.7 F (36.5 C) 97.9 F (36.6 C)  TempSrc: Oral  Oral Oral  Resp: 20 19 11 19   Height:      Weight:      SpO2: 96% 94% 98% 96%    Intake/Output from previous day:  Intake/Output Summary (Last 24 hours) at 10/13/15 0753 Last data filed at 10/13/15 0400  Gross per 24 hour  Intake 507.83 ml  Output    851 ml  Net -343.17 ml    Physical Exam: Affect appropriate Healthy:  appears stated age HEENT: normal Neck supple with no adenopathy JVP normal no bruits no thyromegaly Lungs clear with no wheezing and good diaphragmatic motion Heart:  S1/S2 no murmur, no rub, gallop or click PMI normal Abdomen: benighn, BS positve, no tenderness, no AAA no bruit.  No HSM or HJR Distal pulses intact with no bruits No edema Neuro non-focal Skin warm and dry No muscular weakness Right radial cath sight A   Lab Results: Basic Metabolic Panel:  Recent Labs  10/12/15 0234  NA 140  K 3.6  CL 103  CO2 23  GLUCOSE 114*  BUN 10  CREATININE 0.87  CALCIUM 8.8*     Imaging: No results found.  Cardiac Studies:  ECG:  afib LAD old IMI no acute ST chagnes    Telemetry:  afib rates 90-110  Echo: 04/01/15 Impressions:  - The patient was in atrial fibrillation. Normal LV size with mild  LV hypertrophy. EF 60-65%. Moderately dilated RV with normal  systolic function. Borderline pulmonary hypertension.  Medications:   . allopurinol  300 mg Oral Daily  . amLODipine  5 mg Oral BID  . aspirin  81 mg Oral Daily  . atenolol  50 mg Oral TID  . atorvastatin  80 mg Oral q1800  . cholecalciferol  1,000 Units Oral Daily  . clopidogrel  75 mg Oral Q breakfast  . irbesartan  300 mg Oral Daily    . levothyroxine  50 mcg Oral QAC breakfast  . loratadine  10 mg Oral Daily  . magnesium gluconate  500 mg Oral TID  . omega-3 acid ethyl esters  1 g Oral Daily  . sodium chloride flush  3 mL Intravenous Q12H     . heparin 1,750 Units/hr (10/13/15 0400)    Assessment/Plan:  CAD:  Post RCA DES for staged intervention to complicated bifurcation circumflex on Monday continue heparin and DAT Afib:  Better on higher dose beta blocker  for rate control failed [facility] will need to initiate tikosyn / amiodarone after coronary interventions Would have EP see on Monday.  Continue heparin over weekend  Jenkins Rouge 10/13/2015, 7:53 AM

## 2015-10-13 NOTE — Progress Notes (Signed)
ANTICOAGULATION CONSULT NOTE - Follow-up Consult  Pharmacy Consult for heparin Indication: chest pain/ACS  Allergies  Allergen Reactions  . Bee Venom Other (See Comments)    Leg went numb    Patient Measurements: Height: 6' (182.9 cm) Weight: 260 lb (117.935 kg) IBW/kg (Calculated) : 77.6 Heparin Dosing Weight: 103.3kg  Vital Signs: Temp: 97.1 F (36.2 C) (05/21 0843) Temp Source: Axillary (05/21 0843) BP: 159/95 mmHg (05/21 0745) Pulse Rate: 41 (05/21 0745)  Labs:  Recent Labs  10/11/15 1545 10/12/15 0234 10/12/15 1024 10/13/15 0332  HGB  --   --   --  15.1  HCT  --   --   --  45.0  PLT  --   --   --  212  APTT 55*  --   --   --   HEPARINUNFRC <0.10* 0.37 0.28* 0.63  CREATININE  --  0.87  --   --     Estimated Creatinine Clearance: 112.2 mL/min (by C-G formula based on Cr of 0.87).   Medical History: Past Medical History  Diagnosis Date  . Hypertension   . Hyperlipidemia   . Diabetes mellitus without complication ([place])   . Thyroid disease   . Vitamin D deficiency   . Allergy   . Arthritis     lumbar  . Atrial fibrillation, persistent ([place])     s/p DCCV 03/2015 with reversion back to atrial fibrillation  . OSA (obstructive sleep apnea)     noncompliant with CPAP    Assessment: 66 yom s/p cath on 5/19. Successful stenting of mid RCA w/ DES x2, will start heparin over weekend and return to cath lab Monday for stenting of LCx/OM bifurcation. Pharmacy consulted to dose heparin. HL now therapeutic. Hgb 15.1  Last dose of Xarelto 5/16 pta.  Goal of Therapy:  Heparin level 0.3-0.7 units/ml Monitor platelets by anticoagulation protocol: Yes   Plan:  Continue heparin infusion at 1750 units/hr Daily HL/aPTT/CBC   Monitor s/sx bleeding   Melburn Popper, PharmD Clinical Pharmacy Resident Pager: [phone] 10/13/2015 9:31 AM

## 2015-10-14 ENCOUNTER — Encounter (HOSPITAL_COMMUNITY): Payer: Self-pay | Admitting: Cardiovascular Disease

## 2015-10-14 ENCOUNTER — Encounter (HOSPITAL_COMMUNITY)
Admission: RE | Disposition: A | Discharge status: Home / Self Care | Payer: Self-pay | Source: Ambulatory Visit | Attending: Cardiology

## 2015-10-14 HISTORY — PX: CARDIAC CATHETERIZATION: SHX172

## 2015-10-14 LAB — CBC
HEMATOCRIT: 44.8 % (ref 39.0–52.0)
HEMOGLOBIN: 15.3 g/dL (ref 13.0–17.0)
MCH: 33.2 pg (ref 26.0–34.0)
MCHC: 34.2 g/dL (ref 30.0–36.0)
MCV: 97.2 fL (ref 78.0–100.0)
Platelets: 211 10*3/uL (ref 150–400)
RBC: 4.61 MIL/uL (ref 4.22–5.81)
RDW: 14.2 % (ref 11.5–15.5)
WBC: 8.5 10*3/uL (ref 4.0–10.5)

## 2015-10-14 LAB — HEPARIN LEVEL (UNFRACTIONATED): HEPARIN UNFRACTIONATED: 0.78 [IU]/mL — AB (ref 0.30–0.70)

## 2015-10-14 LAB — POCT ACTIVATED CLOTTING TIME
ACTIVATED CLOTTING TIME: 291 s
Activated Clotting Time: 343 seconds

## 2015-10-14 SURGERY — CORONARY STENT INTERVENTION
Anesthesia: LOCAL

## 2015-10-14 MED ORDER — MIDAZOLAM HCL 2 MG/2ML IJ SOLN
INTRAMUSCULAR | Status: DC | PRN
  Administered 2015-10-14: 2 mg via INTRAVENOUS
  Administered 2015-10-14: 1 mg via INTRAVENOUS

## 2015-10-14 MED ORDER — FENTANYL CITRATE (PF) 100 MCG/2ML IJ SOLN
INTRAMUSCULAR | Status: DC | PRN
  Administered 2015-10-14 (×2): 25 ug via INTRAVENOUS

## 2015-10-14 MED ORDER — ASPIRIN 81 MG PO CHEW
81.0000 mg | CHEWABLE_TABLET | Freq: Every day | ORAL | Status: DC
  Administered 2015-10-15: 81 mg via ORAL
  Filled 2015-10-14: qty 1

## 2015-10-14 MED ORDER — CLOPIDOGREL BISULFATE 75 MG PO TABS: 75.0000 mg | ORAL_TABLET | Freq: Every day | ORAL | Status: DC

## 2015-10-14 MED ORDER — HEPARIN (PORCINE) IN NACL 2-0.9 UNIT/ML-% IJ SOLN
INTRAMUSCULAR | Status: AC
  Filled 2015-10-14: qty 1000

## 2015-10-14 MED ORDER — ONDANSETRON HCL 4 MG/2ML IJ SOLN: 4.0000 mg | Freq: Four times a day (QID) | INTRAMUSCULAR | Status: DC | PRN

## 2015-10-14 MED ORDER — ASPIRIN 81 MG PO CHEW
81.0000 mg | CHEWABLE_TABLET | ORAL | Status: AC
  Administered 2015-10-14: 81 mg via ORAL
  Filled 2015-10-14: qty 1

## 2015-10-14 MED ORDER — SODIUM CHLORIDE 0.9 % IV SOLN: 250.0000 mL | INTRAVENOUS | Status: DC | PRN

## 2015-10-14 MED ORDER — HEPARIN SODIUM (PORCINE) 5000 UNIT/ML IJ SOLN
5000.0000 [IU] | Freq: Three times a day (TID) | INTRAMUSCULAR | Status: DC
  Administered 2015-10-14 – 2015-10-15 (×3): 5000 [IU] via SUBCUTANEOUS
  Filled 2015-10-14 (×3): qty 1

## 2015-10-14 MED ORDER — NITROGLYCERIN 1 MG/10 ML FOR IR/CATH LAB
INTRA_ARTERIAL | Status: AC
  Filled 2015-10-14: qty 10

## 2015-10-14 MED ORDER — VERAPAMIL HCL 2.5 MG/ML IV SOLN
INTRAVENOUS | Status: AC
  Filled 2015-10-14: qty 2

## 2015-10-14 MED ORDER — SODIUM CHLORIDE 0.9 % WEIGHT BASED INFUSION: 1.0000 mL/kg/h | INTRAVENOUS | Status: DC

## 2015-10-14 MED ORDER — FENTANYL CITRATE (PF) 100 MCG/2ML IJ SOLN
INTRAMUSCULAR | Status: AC
  Filled 2015-10-14: qty 2

## 2015-10-14 MED ORDER — NITROGLYCERIN 1 MG/10 ML FOR IR/CATH LAB
INTRA_ARTERIAL | Status: DC | PRN
  Administered 2015-10-14 (×2): 200 ug via INTRACORONARY

## 2015-10-14 MED ORDER — HEPARIN SODIUM (PORCINE) 1000 UNIT/ML IJ SOLN
INTRAMUSCULAR | Status: AC
  Filled 2015-10-14: qty 1

## 2015-10-14 MED ORDER — MIDAZOLAM HCL 2 MG/2ML IJ SOLN
INTRAMUSCULAR | Status: AC
  Filled 2015-10-14: qty 2

## 2015-10-14 MED ORDER — LIDOCAINE HCL (PF) 1 % IJ SOLN
INTRAMUSCULAR | Status: DC | PRN
  Administered 2015-10-14: 3 mL

## 2015-10-14 MED ORDER — ACETAMINOPHEN 325 MG PO TABS: 650.0000 mg | ORAL_TABLET | ORAL | Status: DC | PRN

## 2015-10-14 MED ORDER — HEPARIN SODIUM (PORCINE) 1000 UNIT/ML IJ SOLN
INTRAMUSCULAR | Status: DC | PRN
  Administered 2015-10-14: 9000 [IU] via INTRAVENOUS

## 2015-10-14 MED ORDER — SODIUM CHLORIDE 0.9% FLUSH: 3.0000 mL | Freq: Two times a day (BID) | INTRAVENOUS | Status: DC

## 2015-10-14 MED ORDER — SODIUM CHLORIDE 0.9 % WEIGHT BASED INFUSION
3.0000 mL/kg/h | INTRAVENOUS | Status: DC
  Administered 2015-10-14: 3 mL/kg/h via INTRAVENOUS

## 2015-10-14 MED ORDER — LIDOCAINE HCL (PF) 1 % IJ SOLN
INTRAMUSCULAR | Status: AC
  Filled 2015-10-14: qty 30

## 2015-10-14 MED ORDER — HEPARIN (PORCINE) IN NACL 2-0.9 UNIT/ML-% IJ SOLN
INTRAMUSCULAR | Status: AC
  Filled 2015-10-14: qty 500

## 2015-10-14 MED ORDER — SODIUM CHLORIDE 0.9% FLUSH
3.0000 mL | Freq: Two times a day (BID) | INTRAVENOUS | Status: DC
  Administered 2015-10-14 – 2015-10-15 (×3): 3 mL via INTRAVENOUS

## 2015-10-14 MED ORDER — HEPARIN (PORCINE) IN NACL 2-0.9 UNIT/ML-% IJ SOLN
INTRAMUSCULAR | Status: DC | PRN
  Administered 2015-10-14: 1500 mL

## 2015-10-14 MED ORDER — SODIUM CHLORIDE 0.9% FLUSH: 3.0000 mL | INTRAVENOUS | Status: DC | PRN

## 2015-10-14 MED ORDER — IOPAMIDOL (ISOVUE-370) INJECTION 76%
INTRAVENOUS | Status: DC | PRN
  Administered 2015-10-14: 85 mL via INTRA_ARTERIAL

## 2015-10-14 MED ORDER — IOPAMIDOL (ISOVUE-370) INJECTION 76%
INTRAVENOUS | Status: AC
  Filled 2015-10-14: qty 125

## 2015-10-14 MED ORDER — SODIUM CHLORIDE 0.9% FLUSH
3.0000 mL | INTRAVENOUS | Status: DC | PRN
Start: 2015-10-14 — End: 2015-10-15

## 2015-10-14 MED ORDER — VERAPAMIL HCL 2.5 MG/ML IV SOLN
INTRAVENOUS | Status: DC | PRN
  Administered 2015-10-14: 10:00:00 via INTRA_ARTERIAL

## 2015-10-14 SURGICAL SUPPLY — 17 items
BALLN EMERGE MR 2.0X20 (BALLOONS) ×2
BALLN ~~LOC~~ EUPHORA RX 3.5X15 (BALLOONS) ×2
BALLOON EMERGE MR 2.0X20 (BALLOONS) ×1 IMPLANT
BALLOON ~~LOC~~ EUPHORA RX 3.5X15 (BALLOONS) ×1 IMPLANT
DEVICE RAD COMP TR BAND LRG (VASCULAR PRODUCTS) ×2 IMPLANT
GLIDESHEATH SLEND SS 6F .021 (SHEATH) ×2 IMPLANT
GUIDE CATH RUNWAY 6FR CLS3.5 (CATHETERS) ×2 IMPLANT
KIT ENCORE 26 ADVANTAGE (KITS) ×2 IMPLANT
KIT HEART LEFT (KITS) ×2 IMPLANT
PACK CARDIAC CATHETERIZATION (CUSTOM PROCEDURE TRAY) ×2 IMPLANT
STENT SYNERGY DES 2.75X32 (Permanent Stent) ×2 IMPLANT
TRANSDUCER W/STOPCOCK (MISCELLANEOUS) ×2 IMPLANT
TUBING CIL FLEX 10 FLL-RA (TUBING) ×2 IMPLANT
VALVE GUARDIAN II ~~LOC~~ HEMO (MISCELLANEOUS) ×2 IMPLANT
WIRE ASAHI PROWATER 180CM (WIRE) ×2 IMPLANT
WIRE HI TORQ BMW 190CM (WIRE) ×2 IMPLANT
WIRE SAFE-T 1.5MM-J .035X260CM (WIRE) ×2 IMPLANT

## 2015-10-14 NOTE — H&P (View-Only) (Signed)
Patient ID: Christopher Wade, male   DOB: 1950/10/23, 65 y.o.   MRN: [id number]    Subjective:  Denies SSCP, palpitations or Dyspnea   Objective:  Filed Vitals:   10/12/15 1916 10/12/15 2000 10/12/15 2332 10/13/15 0347  BP: 122/72  130/86 125/71  Pulse: 77 89 80 67  Temp: 98.2 F (36.8 C)  97.7 F (36.5 C) 97.9 F (36.6 C)  TempSrc: Oral  Oral Oral  Resp: 20 19 11 19   Height:      Weight:      SpO2: 96% 94% 98% 96%    Intake/Output from previous day:  Intake/Output Summary (Last 24 hours) at 10/13/15 0753 Last data filed at 10/13/15 0400  Gross per 24 hour  Intake 507.83 ml  Output    851 ml  Net -343.17 ml    Physical Exam: Affect appropriate Healthy:  appears stated age HEENT: normal Neck supple with no adenopathy JVP normal no bruits no thyromegaly Lungs clear with no wheezing and good diaphragmatic motion Heart:  S1/S2 no murmur, no rub, gallop or click PMI normal Abdomen: benighn, BS positve, no tenderness, no AAA no bruit.  No HSM or HJR Distal pulses intact with no bruits No edema Neuro non-focal Skin warm and dry No muscular weakness Right radial cath sight A   Lab Results: Basic Metabolic Panel:  Recent Labs  10/12/15 0234  NA 140  K 3.6  CL 103  CO2 23  GLUCOSE 114*  BUN 10  CREATININE 0.87  CALCIUM 8.8*     Imaging: No results found.  Cardiac Studies:  ECG:  afib LAD old IMI no acute ST chagnes    Telemetry:  afib rates 90-110  Echo: 04/01/15 Impressions:  - The patient was in atrial fibrillation. Normal LV size with mild  LV hypertrophy. EF 60-65%. Moderately dilated RV with normal  systolic function. Borderline pulmonary hypertension.  Medications:   . allopurinol  300 mg Oral Daily  . amLODipine  5 mg Oral BID  . aspirin  81 mg Oral Daily  . atenolol  50 mg Oral TID  . atorvastatin  80 mg Oral q1800  . cholecalciferol  1,000 Units Oral Daily  . clopidogrel  75 mg Oral Q breakfast  . irbesartan  300 mg Oral Daily    . levothyroxine  50 mcg Oral QAC breakfast  . loratadine  10 mg Oral Daily  . magnesium gluconate  500 mg Oral TID  . omega-3 acid ethyl esters  1 g Oral Daily  . sodium chloride flush  3 mL Intravenous Q12H     . heparin 1,750 Units/hr (10/13/15 0400)    Assessment/Plan:  CAD:  Post RCA DES for staged intervention to complicated bifurcation circumflex on Monday continue heparin and DAT Afib:  Better on higher dose beta blocker  for rate control failed [facility] will need to initiate tikosyn / amiodarone after coronary interventions Would have EP see on Monday.  Continue heparin over weekend  Jenkins Rouge 10/13/2015, 7:53 AM

## 2015-10-14 NOTE — Interval H&P Note (Signed)
Cath Lab Visit (complete for each Cath Lab visit)  Clinical Evaluation Leading to the Procedure:   ACS: Yes.    Non-ACS:    Anginal Classification: CCS IV  Anti-ischemic medical therapy: Minimal Therapy (1 class of medications)  Non-Invasive Test Results: No non-invasive testing performed  Prior CABG: No previous CABG  Planned PCI of circ    History and Physical Interval Note:  10/14/2015 8:24 AM  Christopher Wade  has presented today for surgery, with the diagnosis of cad  The various methods of treatment have been discussed with the patient and family. After consideration of risks, benefits and other options for treatment, the patient has consented to  Procedure(s): Coronary Stent Intervention (N/A) as a surgical intervention .  The patient's history has been reviewed, patient examined, no change in status, stable for surgery.  I have reviewed the patient's chart and labs.  Questions were answered to the patient's satisfaction.     Christopher Wade

## 2015-10-14 NOTE — Progress Notes (Signed)
ANTICOAGULATION CONSULT NOTE - Follow Up Consult  Pharmacy Consult for heparin Indication: atrial fibrillation and awaiting PCI   Labs:  Recent Labs  10/11/15 1545 10/12/15 0234 10/12/15 1024 10/13/15 0332 10/14/15 0324  HGB  --   --   --  15.1 15.3  HCT  --   --   --  45.0 44.8  PLT  --   --   --  212 211  APTT 55*  --   --   --   --   HEPARINUNFRC <0.10* 0.37 0.28* 0.63 0.78*  CREATININE  --  0.87  --   --   --      Assessment: 65yo male now above goal on heparin after one level at high end of goal.  Goal of Therapy:  Heparin level 0.3-0.7 units/ml   Plan:  Will decrease heparin gtt by 1-2 units/kg/hr to 1600 units/hr and check level in 6hr.  Wynona Neat, PharmD, BCPS  10/14/2015,4:07 AM

## 2015-10-15 LAB — CBC
HCT: 43.2 % (ref 39.0–52.0)
HEMOGLOBIN: 14.7 g/dL (ref 13.0–17.0)
MCH: 33.3 pg (ref 26.0–34.0)
MCHC: 34 g/dL (ref 30.0–36.0)
MCV: 97.7 fL (ref 78.0–100.0)
Platelets: 207 10*3/uL (ref 150–400)
RBC: 4.42 MIL/uL (ref 4.22–5.81)
RDW: 14.2 % (ref 11.5–15.5)
WBC: 8.7 10*3/uL (ref 4.0–10.5)

## 2015-10-15 LAB — BASIC METABOLIC PANEL
ANION GAP: 8 (ref 5–15)
BUN: 11 mg/dL (ref 6–20)
CALCIUM: 8.7 mg/dL — AB (ref 8.9–10.3)
CHLORIDE: 104 mmol/L (ref 101–111)
CO2: 25 mmol/L (ref 22–32)
CREATININE: 0.91 mg/dL (ref 0.61–1.24)
GFR calc Af Amer: >60 mL/min (ref >60)
GFR calc non Af Amer: >60 mL/min (ref >60)
GLUCOSE: 117 mg/dL — AB (ref 65–99)
Potassium: 3.5 mmol/L (ref 3.5–5.1)
Sodium: 137 mmol/L (ref 135–145)

## 2015-10-15 MED ORDER — CLOPIDOGREL BISULFATE 75 MG PO TABS: 75.0000 mg | ORAL_TABLET | Freq: Every day | ORAL | Status: DC

## 2015-10-15 MED ORDER — ATORVASTATIN CALCIUM 80 MG PO TABS: 80.0000 mg | ORAL_TABLET | Freq: Every day | ORAL | Status: DC

## 2015-10-15 MED ORDER — RIVAROXABAN 20 MG PO TABS
20.0000 mg | ORAL_TABLET | Freq: Every day | ORAL | Status: DC
  Administered 2015-10-15: 20 mg via ORAL
  Filled 2015-10-15: qty 1

## 2015-10-15 NOTE — Discharge Summary (Signed)
Physician Discharge Summary   Patient ID: Christopher Wade MRN: [id number] DOB/AGE: Apr 08, 1951 65 y.o.  Admit date: 10/11/2015 Discharge date: 10/15/2015  Primary Discharge Diagnosis: CAD Secondary Discharge Diagnosis: atrial fibrillation  Significant Diagnostic Studies:    1. Cardiac catheterization 10/14/15:   -Ost 1st Mrg to 1st Mrg lesion to mid circumflex, 95% stenosed. Post intervention with a 2.75 x 32 Synergy, postdilated to 3.5 mm, there is a 0% residual stenosis.  - The Synergy stent extends from the OM back into the native circumflex, jailing the mid circumflex. Postprocedure, there is TIMI-3 flow in the circumflex with only a moderate stenosis at the jailed portion.  Continue dual antiplatelet therapy and aggressive secondary prevention. The patient will restart his Xarelto. At that point, consideration should be made to stop the aspirin and have him continue Xarelto and clopidogrel long-term.  2. Cardiac catheterization 10/11/15:   - LM lesion, 50% stenosed.  Dist LAD lesion, 40% stenosed.  Ost Cx to Prox Cx lesion, 70% stenosed.  1st Mrg lesion, 70% stenosed.  Mid LAD lesion, 40% stenosed.  Mid RCA to Dist RCA lesion, 95% stenosed. Post intervention, there is a 0% residual stenosis. The lesion was not previously treated.  1. LAD FFR of 0.87 2. Successful stenting of the mid RCA with DES x 2.  Plan: will heparinize over the weekend and plan on returning on Monday for stenting of the LCx/OM bifurcation. At discharge will resume Xarelto and continue ASA and Plavix. Plan to stop ASA at one month and continue Plavix for one year.     Consults: None  Hospital Course:  Christopher Wade is a 65 y.o. male w/ PMHx of HTN, HLD, DM, thyroid disease, Vit D deficiency, OSA, and afib who presented to [facility] for LHC due to chest pain w/ abnormal myoview on 5/12 that was intermediate risk. He had LHC on 5/19 w/ DES to RCA and had a second LHC on 5/22 w/ DES of LCx/OM. He was  started on triple antiplatelet therapy witih asa, plavix, and xarelto for 1 month after which he will stop asa. Due to catheterization his xarelto was held prior to procedures. He has follow up with [name] on June 13th to determine need for antiarrhythmic therapy and repeat DCCV for afib as he has failed DCCV in that past. He is not a candidate for flecainide due to CAD.   Discharge Exam: Blood pressure 134/74, pulse 66, temperature 97.9 F (36.6 C), temperature source Oral, resp. rate 21, height 6' (1.829 m), weight 260 lb (117.935 kg), SpO2 95 %.  PHYSICAL EXAM General: Well developed, well nourished, in no acute distress. Head: Normocephalic, atraumatic, sclera non-icteric, oropharynx is clear Neck: Negative for carotid bruits. JVD not elevated. No adenopathy Lungs: Clear bilaterally to auscultation without wheezes, rales, or rhonchi. Breathing is unlabored. Heart: RRR S1 S2 without murmurs, rubs, or gallops.  Abdomen: Soft, non-tender, non-distended with normoactive bowel sounds. No hepatomegaly. No rebound/guarding. No obvious abdominal masses. Msk: Strength and tone appears normal for age. Extremities: No clubbing, cyanosis or edema. Distal pedal pulses are 2+ and equal bilaterally. Neuro: Alert and oriented X 3. Moves all extremities spontaneously. Psych: Responds to questions appropriately with a normal affect. Labs:   Lab Results  Component Value Date   WBC 8.7 10/15/2015   HGB 14.7 10/15/2015   HCT 43.2 10/15/2015   MCV 97.7 10/15/2015   PLT 207 10/15/2015     Recent Labs Lab 10/15/15 0218  NA 137  K 3.5  CL 104  CO2 25  BUN 11  CREATININE 0.91  CALCIUM 8.7*  GLUCOSE 117*   Lab Results  Component Value Date   CKTOTAL 73 08/20/2014    Lab Results  Component Value Date   CHOL 155 08/29/2015   CHOL 175 05/16/2015   CHOL 165 11/12/2014   Lab Results  Component Value Date   HDL 36* 08/29/2015   HDL 43 05/16/2015   HDL 41 11/12/2014   Lab Results   Component Value Date   LDLCALC 85 08/29/2015   LDLCALC 103 05/16/2015   LDLCALC 90 11/12/2014   Lab Results  Component Value Date   TRIG 171* 08/29/2015   TRIG 145 05/16/2015   TRIG 170* 11/12/2014   Lab Results  Component Value Date   CHOLHDL 4.3 08/29/2015   CHOLHDL 4.1 05/16/2015   CHOLHDL 4.0 11/12/2014     Radiology: 2view CXR negative for acute cardiopulmonary disease.  EKG: 5/22 afib rate controlled.   FOLLOW UP PLANS AND APPOINTMENTS     Discharge Instructions    AMB Referral to Cardiac Rehabilitation - Phase II    Complete by:  As directed   Diagnosis:  Coronary Stents     Amb Referral to Cardiac Rehabilitation    Complete by:  As directed   Diagnosis:   PTCA Coronary Stents       Diet - low sodium heart healthy    Complete by:  As directed      Increase activity slowly    Complete by:  As directed             Medication List    TAKE these medications        allopurinol 300 MG tablet  Commonly known as:  ZYLOPRIM  take 1 tablet by mouth once daily     amLODipine 5 MG tablet  Commonly known as:  NORVASC  take 1 tablet by mouth twice a day     aspirin EC 81 MG tablet  Take 81 mg by mouth daily as needed (For pain.).     atenolol 100 MG tablet  Commonly known as:  TENORMIN  Take 0.5 tablets (50 mg total) by mouth 2 (two) times daily.     atorvastatin 80 MG tablet  Commonly known as:  LIPITOR  Take 1 tablet (80 mg total) by mouth daily at 6 PM.     B COMPLEX PO  Take 1 tablet by mouth daily.     cholecalciferol 1000 units tablet  Commonly known as:  VITAMIN D  Take 1,000 Units by mouth daily.     clopidogrel 75 MG tablet  Commonly known as:  PLAVIX  Take 1 tablet (75 mg total) by mouth daily with breakfast.     colchicine 0.6 MG tablet  Take 1 tablet daily to prevent Gout     Fish Oil 1200 MG Caps  Take 1,200 mg by mouth daily.     HYDROcodone-acetaminophen 5-325 MG tablet  Commonly known as:  NORCO/VICODIN  Take 1 tablet by  mouth every 6 (six) hours as needed for moderate pain.     levothyroxine 50 MCG tablet  Commonly known as:  SYNTHROID, LEVOTHROID  take 1 tablet by mouth once daily     loratadine 10 MG tablet  Commonly known as:  CLARITIN  Take 10 mg by mouth daily.     magnesium gluconate 500 MG tablet  Commonly known as:  MAGONATE  Take 500 mg by mouth 3 (three) times daily.  metFORMIN 500 MG tablet  Commonly known as:  GLUCOPHAGE  take 1 tablet by mouth twice a day with food     nystatin cream  Commonly known as:  MYCOSTATIN  Apply 1 application topically 2 (two) times daily as needed (wound care).     rivaroxaban 20 MG Tabs tablet  Commonly known as:  XARELTO  Take 1 tablet (20 mg total) by mouth daily with supper.     valsartan 320 MG tablet  Commonly known as:  DIOVAN  take 1 tablet by mouth once daily       Follow-up Information    Follow up with Fransico Him, MD On 11/05/2015.   Specialty:  Cardiology   Why:  at 3:30pm for hospital follow up and routine check up.    Contact information:   [id number] [place] [address] [phone]       [facility] ALL MEDICATIONS WITH YOU TO FOLLOW UP APPOINTMENTS  Signed: Julious Oka 10/15/2015, 11:28 AM

## 2015-10-15 NOTE — Progress Notes (Signed)
CARDIAC REHAB PHASE I   PRE:  Rate/Rhythm: 76 afib    BP: sitting 124/83    SaO2:   MODE:  Ambulation: 350 ft   POST:  Rate/Rhythm: 93 afib    BP: sitting 133/82     SaO2:   Pt without c/o this am. Seems somewhat slow/weak but he was happy he could walk so far without SOB. Ed completed with pt. Voiced understanding and requests his referral be sent to [place]. Understands importance of Plavix and other meds. Encouraged to increase activity slowly over next week. [place], ACSM 10/15/2015 9:32 AM

## 2015-10-15 NOTE — Progress Notes (Signed)
TELEMETRY: Reviewed telemetry pt in atrial fibrillation with controlled rate: Filed Vitals:   10/14/15 1915 10/14/15 2337 10/15/15 0335 10/15/15 0742  BP: 144/74 131/71 135/90 134/74  Pulse: 67 67 66   Temp: 97.8 F (36.6 C) 98 F (36.7 C) 97.3 F (36.3 C) 97.9 F (36.6 C)  TempSrc: Oral Oral Oral Oral  Resp: 24 14 18 21   Height:      Weight:      SpO2: 95% 94% 94% 95%    Intake/Output Summary (Last 24 hours) at 10/15/15 0807 Last data filed at 10/15/15 0100  Gross per 24 hour  Intake 1393.86 ml  Output    675 ml  Net 718.86 ml   Filed Weights   10/11/15 0919  Weight: 117.935 kg (260 lb)    Subjective Feels Ok. A little weak. Denies chest pain or SOB.   [name] allopurinol  300 mg Oral Daily  . amLODipine  5 mg Oral BID  . aspirin  81 mg Oral Daily  . atenolol  50 mg Oral TID  . atorvastatin  80 mg Oral q1800  . cholecalciferol  1,000 Units Oral Daily  . clopidogrel  75 mg Oral Q breakfast  . heparin  5,000 Units Subcutaneous Q8H  . irbesartan  300 mg Oral Daily  . levothyroxine  50 mcg Oral QAC breakfast  . loratadine  10 mg Oral Daily  . magnesium gluconate  500 mg Oral TID  . omega-3 acid ethyl esters  1 g Oral Daily  . sodium chloride flush  3 mL Intravenous Q12H  . sodium chloride flush  3 mL Intravenous Q12H      LABS: Basic Metabolic Panel:  Recent Labs  10/15/15 0218  NA 137  K 3.5  CL 104  CO2 25  GLUCOSE 117*  BUN 11  CREATININE 0.91  CALCIUM 8.7*   Liver Function Tests: No results for input(s): AST, ALT, ALKPHOS, BILITOT, PROT, ALBUMIN in the last 72 hours. No results for input(s): LIPASE, AMYLASE in the last 72 hours. CBC:  Recent Labs  10/14/15 0324 10/15/15 0218  WBC 8.5 8.7  HGB 15.3 14.7  HCT 44.8 43.2  MCV 97.2 97.7  PLT 211 207   Cardiac Enzymes: No results for input(s): CKTOTAL, CKMB, CKMBINDEX, TROPONINI in the last 72 hours. BNP: No results for input(s): PROBNP in the last 72 hours. D-Dimer: No results for  input(s): DDIMER in the last 72 hours. Hemoglobin A1C: No results for input(s): HGBA1C in the last 72 hours. Fasting Lipid Panel: No results for input(s): CHOL, HDL, LDLCALC, TRIG, CHOLHDL, LDLDIRECT in the last 72 hours. Thyroid Function Tests: No results for input(s): TSH, T4TOTAL, T3FREE, THYROIDAB in the last 72 hours.  Invalid input(s): FREET3   Radiology/Studies:  No results found.  PHYSICAL EXAM General: Well developed, obese, in no acute distress. Head: Normocephalic, atraumatic, sclera non-icteric, oropharynx is clear Neck: Negative for carotid bruits. JVD not elevated. No adenopathy Lungs: Clear bilaterally to auscultation without wheezes, rales, or rhonchi. Breathing is unlabored. Heart: IRRR S1 S2 without murmurs, rubs, or gallops.  Abdomen: Soft, non-tender, non-distended with normoactive bowel sounds. No hepatomegaly. No rebound/guarding. No obvious abdominal masses. Msk:  Strength and tone appears normal for age. Extremities: No clubbing, cyanosis or edema.  Distal pedal pulses are 2+ and equal bilaterally. Radial site without hematoma. Neuro: Alert and oriented X 3. Moves all extremities spontaneously. Psych:  Responds to questions appropriately with a normal affect.  ASSESSMENT AND PLAN: 1. CAD with abnormal myoview.  Severe 2 vessel CAD. S/p DES of the RCA on Friday. S/p DES of LCx/OM yesterday. No complications. On DAPT with ASA and Plavix. Stop ASA at one month since also on Xarelto. 2. Atrial fibrillation. Rate is well controlled. Failed DCCV before. Will resume Xarelto today. Will need to consider antiarrhythmic drug therapy and repeat DCCV. Not a candidate for flecainide due to CAD. With interruption of anticoagulation due to cath will defer decision of antiarrhythmic therapy to [name] 3. HTN controlled. 4. DM type 2 5. HLD on statin  Plan: DC home today. Follow up in 1-2 weeks. He is clear to return to work in one week. Encourage cardiac Rehab stage  2.   Present on Admission:  . Hyperlipidemia . DM type 2 causing CKD stage 2 ([place]) . Essential hypertension . Atrial fibrillation, persistent ([place]) . Abnormal nuclear stress test . Angina pectoris ([place])  Signed, Peter Martin[place], [place] 10/15/2015 8:07 AM

## 2015-10-15 NOTE — Progress Notes (Signed)
Nutrition Brief Note  Patient identified on the Malnutrition Screening Tool (MST) Report  Wt Readings from Last 15 Encounters:  10/11/15 260 lb (117.935 kg)  10/04/15 260 lb (117.935 kg)  09/09/15 260 lb 6.4 oz (118.117 kg)  08/29/15 263 lb 3.2 oz (119.387 kg)  05/23/15 272 lb 12.8 oz (123.741 kg)  05/16/15 275 lb (124.739 kg)  05/09/15 277 lb 9.6 oz (125.919 kg)  04/05/15 264 lb (119.75 kg)  02/22/15 264 lb 12.8 oz (120.112 kg)  01/21/15 266 lb (120.657 kg)  12/17/14 262 lb (118.842 kg)  11/12/14 270 lb (122.471 kg)  08/08/14 257 lb (116.574 kg)  06/07/14 271 lb (122.925 kg)  05/01/14 258 lb (117.028 kg)   Spoke with Christopher Wade briefly. He endorses a good appetite, though he is "very busy" at work and may only eat breakfast and dinner.   He endorses wt loss, but states "I cut back on what I was eating some." Does not perceive it to be unintentional wt loss -> 15#/5.4% over 5 months.  He is otherwise fine,  MD note states he is to be discharged today, discharge summary not in yet.  Body mass index is 35.25 kg/(m^2). Patient meets criteria for obese class II based on current BMI.   Current diet order is Heart Healthy, patient is consuming approximately 90-100% of meals at this time. Labs and medications reviewed.   No nutrition interventions warranted at this time. If nutrition issues arise, please consult RD.   Christopher Anis. Emin Foree, MS, RD LDN Inpatient Clinical Dietitian Pager [phone]

## 2015-10-15 NOTE — Discharge Instructions (Signed)
Information on my medicine - XARELTO (Rivaroxaban)  This medication education was reviewed with me or my healthcare representative as part of my discharge preparation.  The pharmacist that spoke with me during my hospital stay was:  Roma Schanz, [facility]  Why was Xarelto prescribed for you? Xarelto was prescribed for you to reduce the risk of a blood clot forming that can cause a stroke if you have a medical condition called atrial fibrillation (a type of irregular heartbeat).  What do you need to know about xarelto ? Take your Xarelto ONCE DAILY at the same time every day with your evening meal. If you have difficulty swallowing the tablet whole, you may crush it and mix in applesauce just prior to taking your dose.  Take Xarelto exactly as prescribed by your doctor and DO NOT stop taking Xarelto without talking to the doctor who prescribed the medication.  Stopping without other stroke prevention medication to take the place of Xarelto may increase your risk of developing a clot that causes a stroke.  Refill your prescription before you run out.  After discharge, you should have regular check-up appointments with your healthcare provider that is prescribing your Xarelto.  In the future your dose may need to be changed if your kidney function or weight changes by a significant amount.  What do you do if you miss a dose? If you are taking Xarelto ONCE DAILY and you miss a dose, take it as soon as you remember on the same day then continue your regularly scheduled once daily regimen the next day. Do not take two doses of Xarelto at the same time or on the same day.   Important Safety Information A possible side effect of Xarelto is bleeding. You should call your healthcare provider right away if you experience any of the following: ? Bleeding from an injury or your nose that does not stop. ? Unusual colored urine (red or dark brown) or unusual colored stools (red or black). ? Unusual  bruising for unknown reasons. ? A serious fall or if you hit your head (even if there is no bleeding).  Some medicines may interact with Xarelto and might increase your risk of bleeding while on Xarelto. To help avoid this, consult your healthcare provider or pharmacist prior to using any new prescription or non-prescription medications, including herbals, vitamins, non-steroidal anti-inflammatory drugs (NSAIDs) and supplements.  This website has more information on Xarelto: [email or web address].[email or web address]

## 2015-10-15 NOTE — Progress Notes (Signed)
Discharge instructions given to pt.  All questions were answered.  Educated pt on new meds and level of activity for discharge.  Pt waiting on his ride.

## 2015-10-28 ENCOUNTER — Other Ambulatory Visit: Payer: Self-pay | Admitting: Internal Medicine

## 2015-11-05 ENCOUNTER — Ambulatory Visit: Payer: Medicare Other | Admitting: Cardiology

## 2015-11-05 ENCOUNTER — Ambulatory Visit (INDEPENDENT_AMBULATORY_CARE_PROVIDER_SITE_OTHER): Payer: Medicare Other | Admitting: Cardiology

## 2015-11-05 ENCOUNTER — Encounter: Payer: Self-pay | Admitting: Cardiology

## 2015-11-05 VITALS — BP 132/68 | HR 82 | Ht 72.0 in | Wt 254.0 lb

## 2015-11-05 DIAGNOSIS — E669 Obesity, unspecified: Secondary | ICD-10-CM

## 2015-11-05 DIAGNOSIS — I480 Paroxysmal atrial fibrillation: Secondary | ICD-10-CM | POA: Diagnosis not present

## 2015-11-05 DIAGNOSIS — I10 Essential (primary) hypertension: Secondary | ICD-10-CM

## 2015-11-05 DIAGNOSIS — E785 Hyperlipidemia, unspecified: Secondary | ICD-10-CM

## 2015-11-05 MED ORDER — AMIODARONE HCL 200 MG PO TABS: 200.0000 mg | ORAL_TABLET | Freq: Two times a day (BID) | ORAL | Status: DC

## 2015-11-05 MED ORDER — AMLODIPINE BESYLATE 5 MG PO TABS: 5.0000 mg | ORAL_TABLET | Freq: Every day | ORAL | Status: DC

## 2015-11-05 NOTE — Patient Instructions (Signed)
Medication Instructions:  1) DECREASE AMLODIPINE to 5 mg daily NOW 2) STOP ASPIRIN on 11/14/2015 3) START AMIODARONE 200 mg TWICE DAILY on 11/16/2015  Labwork: Your physician recommends that you return for FASTING lab work the same day as your 4 week follow-up appointment.  Testing/Procedures: Your physician has recommended that you have a pulmonary function test. Pulmonary Function Tests are a group of tests that measure how well air moves in and out of your lungs.  Follow-Up: Your physician recommends that you schedule a follow-up appointment in 4 weeks with a PA or NP.   Your physician recommends that you schedule a follow-up appointment in 3 months with [name].  Any Other Special Instructions Will Be Listed Below (If Applicable).     If you need a refill on your cardiac medications before your next appointment, please call your pharmacy.

## 2015-11-05 NOTE — Progress Notes (Signed)
Cardiology Office Note    Date:  11/06/2015   ID:  Christopher Wade, DOB November 28, 1950, MRN [id number]  PCP:  Alesia Richards, MD  Cardiologist:  Fransico Him, MD   No chief complaint on file.   History of Present Illness:  Christopher Wade is a 65 y.o. male who presents for followup of atrial fibrillation s/p  DCCV 03/2015 to NSR but before discharge he went back into afib. He has a history of HTN, dyslipidemia and DM. He was seen back in clinic for antiarrhythmic drug therapy consideration and nuclear stress test was ordered firs which revealed inferolateral ischemia.  He underwent cath which showed 50% LM, 40% distal LAD, 70% ost to prox LCs, 40% mid LAD and 95% mid to dist RCA s/p PCI with DES x 2.  Flow wire of the LAD was normal with FFR 0.87. He then underwent staged PCI of the LCx/OM a few days later.  He was started on triple therapy with ASA/Plavix and Xarelto for 1 month and plan was for stopping ASA after 1 month.  He is now here to discuss antiarrhythmic therapy to try to maintain NSR.  He denies any anginal chest pain (he says that he does get some discomfort working out in the extreme heat), SOB(except exertion in the heat),  palpitations or syncope. He occasionally will have some LE edema due to an ankle injury. He has had some problems with low BP in the low 100's.  He has not missed any doses of Xarelto since restarting it on 10/16/2015.    Past Medical History  Diagnosis Date  . Hypertension   . Hyperlipidemia   . Diabetes mellitus without complication ([place])   . Thyroid disease   . Vitamin D deficiency   . Allergy   . Arthritis     lumbar  . Atrial fibrillation, persistent ([place])     s/p DCCV 03/2015 with reversion back to atrial fibrillation  . OSA (obstructive sleep apnea)     noncompliant with CPAP  . PAF (paroxysmal atrial fibrillation) [facility])     Past Surgical History  Procedure Laterality Date  . Joint replacement Bilateral 2001    Hips  .  Cardioversion N/A 04/05/2015    Procedure: CARDIOVERSION;  Surgeon: [name], MD;  Location: [facility] ENDOSCOPY;  Service: Cardiovascular;  Laterality: N/A;  . Cardiac catheterization N/A 10/11/2015    Procedure: Left Heart Cath and Coronary Angiography;  Surgeon: [name], MD;  Location: [place] CV LAB;  Service: Cardiovascular;  Laterality: N/A;  . Cardiac catheterization N/A 10/11/2015    Procedure: Coronary Stent Intervention;  Surgeon: [name], MD;  Location: [place] CV LAB;  Service: Cardiovascular;  Laterality: N/A;  . Cardiac catheterization N/A 10/11/2015    Procedure: Intravascular Pressure Wire/FFR Study;  Surgeon: [name], MD;  Location: [place] CV LAB;  Service: Cardiovascular;  Laterality: N/A;  . Cardiac catheterization N/A 10/14/2015    Procedure: Coronary Stent Intervention;  Surgeon: [name], MD;  Location: [place] CV LAB;  Service: Cardiovascular;  Laterality: N/A;    Current Medications: Outpatient Prescriptions Prior to Visit  Medication Sig Dispense Refill  . allopurinol (ZYLOPRIM) 300 MG tablet take 1 tablet by mouth once daily 30 tablet 2  . atenolol (TENORMIN) 100 MG tablet Take 0.5 tablets (50 mg total) by mouth 2 (two) times daily. 90 tablet 1  . atorvastatin (LIPITOR) 80 MG tablet Take 1 tablet (80 mg total) by mouth daily at  6 PM. 30 tablet 1  . B Complex Vitamins (B COMPLEX PO) Take 1 tablet by mouth daily.    . cholecalciferol (VITAMIN D) 1000 UNITS tablet Take 1,000 Units by mouth daily.    . clopidogrel (PLAVIX) 75 MG tablet Take 1 tablet (75 mg total) by mouth daily with breakfast. 30 tablet 11  . colchicine 0.6 MG tablet Take 1 tablet daily to prevent Gout (Patient taking differently: Take 0.6 mg by mouth daily. ) 90 tablet 0  . HYDROcodone-acetaminophen (NORCO/VICODIN) 5-325 MG tablet Take 1 tablet by mouth every 6 (six) hours as needed for moderate pain. 90 tablet 0  . levothyroxine (SYNTHROID, LEVOTHROID) 50 MCG  tablet take 1 tablet by mouth once daily 90 tablet 0  . loratadine (CLARITIN) 10 MG tablet Take 10 mg by mouth daily.    . magnesium gluconate (MAGONATE) 500 MG tablet Take 500 mg by mouth 3 (three) times daily.     . metFORMIN (GLUCOPHAGE) 500 MG tablet take 1 tablet by mouth twice a day 60 tablet 3  . nystatin cream (MYCOSTATIN) Apply 1 application topically 2 (two) times daily as needed (wound care). 30 g 1  . Omega-3 Fatty Acids (FISH OIL) 1200 MG CAPS Take 1,200 mg by mouth daily.    . rivaroxaban (XARELTO) 20 MG TABS tablet Take 1 tablet (20 mg total) by mouth daily with supper. 30 tablet 4  . valsartan (DIOVAN) 320 MG tablet take 1 tablet by mouth once daily 90 tablet 0  . amLODipine (NORVASC) 5 MG tablet take 1 tablet by mouth twice a day 60 tablet 3  . aspirin EC 81 MG tablet Take 81 mg by mouth daily as needed (For pain.).     No facility-administered medications prior to visit.     Allergies:   Bee venom   Social History   Social History  . Marital Status: Single    Spouse Name: N/A  . Number of Children: N/A  . Years of Education: N/A   Social History Main Topics  . Smoking status: Never Smoker   . Smokeless tobacco: Never Used  . Alcohol Use: No  . Drug Use: No  . Sexual Activity: Not Currently   Other Topics Concern  . None   Social History Narrative     Family History:  The patient's family history includes Cancer (age of onset: 24) in his father; Cancer (age of onset: 76) in his mother; Cancer (age of onset: 93) in his sister.   ROS:   Please see the history of present illness.    ROS All other systems reviewed and are negative.   PHYSICAL EXAM:   VS:  BP 132/68 mmHg  Pulse 82  Ht 6' (1.829 m)  Wt 254 lb (115.214 kg)  BMI 34.44 kg/m2   GEN: Well nourished, well developed, in no acute distress HEENT: normal Neck: no JVD, carotid bruits, or masses Cardiac: irregularly irregular; no murmurs, rubs, or gallops,no edema.  Intact distal pulses  bilaterally.  Respiratory:  clear to auscultation bilaterally, normal work of breathing GI: soft, nontender, nondistended, + BS MS: no deformity or atrophy Skin: warm and dry, no rash Neuro:  Alert and Oriented x 3, Strength and sensation are intact Psych: euthymic mood, full affect  Wt Readings from Last 3 Encounters:  11/05/15 254 lb (115.214 kg)  10/11/15 260 lb (117.935 kg)  10/04/15 260 lb (117.935 kg)      Studies/Labs Reviewed:   EKG:  EKG is not ordered today.  Recent Labs: 08/29/2015: ALT 23; Magnesium 1.6; TSH 2.28 10/15/2015: BUN 11; Creatinine, Ser 0.91; Hemoglobin 14.7; Platelets 207; Potassium 3.5; Sodium 137   Lipid Panel    Component Value Date/Time   CHOL 155 08/29/2015 1714   TRIG 171* 08/29/2015 1714   HDL 36* 08/29/2015 1714   CHOLHDL 4.3 08/29/2015 1714   VLDL 34* 08/29/2015 1714   LDLCALC 85 08/29/2015 1714    Additional studies/ records that were reviewed today include:  none    ASSESSMENT:    1. PAF (paroxysmal atrial fibrillation) ([place])   2. Essential hypertension   3. Obesity (BMI 30-39.9)   4. Hyperlipidemia      PLAN:  In order of problems listed above:  1. Persistent atrial fibrillation.  He is s/p DCCV but reverted back to atrial fibrillation with CVR.  Cannot use flecainide due to CAD.  Options are Amio, Sotolol or Tikosyn.  I have discussed at length the potential risks of amiodarone therapy including thyroid and liver abnormalities, cataracts and potential lung toxicity as well as less bothersome side effects of nausea and tremors.  He understands and agrees to try Amio.  I will get baseline PFTs with DLCO.  His last TSH and LFTs were normal.  He was instructed to get yearly eye exams.  I will have him followup in 2-3 weeks with the PA and if he is still in afib will set up DCCV.  He restarted his Xarelto on 5/24 so I have instructed him to start Amio 200mg  BID and then followup with PA 2 weeks after that.  He has not missed any doses  of Xarelto since restarting.  2.   ASCAD with cath showing 50% LM, 40% distal LAD, 70% ost to prox LCs, 40% mid LAD and 95% mid to dist RCA s/p PCI with DES x 2. Flow wire of the LAD was normal with FFR 0.87.  PCI of the LCx/OM a few days later.  He has not had any further anginal symptoms.  He is now on triple therapy with ASA/Plavix and Xarelto.  He was instructed to stop ASA on 6/22 as recommended at time of PCI.  3.   Hyperlipidemia - LDL goal is < 70.  Continue statin.  Repeat FLP and ALT.  4.  HTN - Bp controlled on current medical regimen and he complains that he has been having problems with dizziness on occasion with SBP in the low 100s.  I have recommended that he cut his amlodipine back to 5mg  daily.  Continue BB/Diovan  5.   OSA on CPAP but not very compliant.  I recommended that he try to be more compliant with his device to cut back on episodes of afib.    Medication Adjustments/Labs and Tests Ordered: Current medicines are reviewed at length with the patient today.  Concerns regarding medicines are outlined above.  Medication changes, Labs and Tests ordered today are listed in the Patient Instructions below.  Patient Instructions  Medication Instructions:  1) DECREASE AMLODIPINE to 5 mg daily NOW 2) STOP ASPIRIN on 11/14/2015 3) START AMIODARONE 200 mg TWICE DAILY on 11/16/2015  Labwork: Your physician recommends that you return for FASTING lab work the same day as your 4 week follow-up appointment.  Testing/Procedures: Your physician has recommended that you have a pulmonary function test. Pulmonary Function Tests are a group of tests that measure how well air moves in and out of your lungs.  Follow-Up: Your physician recommends that you schedule a follow-up appointment in  4 weeks with a PA or NP.   Your physician recommends that you schedule a follow-up appointment in 3 months with [name].  Any Other Special Instructions Will Be Listed Below (If  Applicable).     If you need a refill on your cardiac medications before your next appointment, please call your pharmacy.       Signed, Fransico Him, MD  11/06/2015 9:37 PM    [address], [place], [place]  [address] Phone: [phone]; Fax: [phone]

## 2015-11-07 ENCOUNTER — Telehealth: Payer: Self-pay | Admitting: Cardiology

## 2015-11-07 ENCOUNTER — Encounter: Payer: Self-pay | Admitting: Cardiology

## 2015-11-07 DIAGNOSIS — R942 Abnormal results of pulmonary function studies: Secondary | ICD-10-CM

## 2015-11-11 ENCOUNTER — Ambulatory Visit (INDEPENDENT_AMBULATORY_CARE_PROVIDER_SITE_OTHER): Payer: Medicare Other | Admitting: Internal Medicine

## 2015-11-11 DIAGNOSIS — I480 Paroxysmal atrial fibrillation: Secondary | ICD-10-CM | POA: Diagnosis not present

## 2015-11-11 LAB — PULMONARY FUNCTION TEST
DL/VA % pred: 71 %
DL/VA: 3.35 ml/min/mmHg/L
DLCO COR: 18.34 ml/min/mmHg
DLCO UNC % PRED: 56 %
DLCO UNC: 18.89 ml/min/mmHg
DLCO cor % pred: 54 %
FEF 25-75 PRE: 1.91 L/s
FEF 25-75 Post: 2.76 L/sec
FEF2575-%Change-Post: 44 %
FEF2575-%PRED-POST: 99 %
FEF2575-%PRED-PRE: 68 %
FEV1-%Change-Post: 8 %
FEV1-%PRED-POST: 78 %
FEV1-%PRED-PRE: 71 %
FEV1-PRE: 2.55 L
FEV1-Post: 2.77 L
FEV1FVC-%Change-Post: 3 %
FEV1FVC-%PRED-PRE: 99 %
FEV6-%CHANGE-POST: 4 %
FEV6-%PRED-POST: 79 %
FEV6-%Pred-Pre: 75 %
FEV6-Post: 3.56 L
FEV6-Pre: 3.41 L
FEV6FVC-%CHANGE-POST: 0 %
FEV6FVC-%PRED-PRE: 105 %
FEV6FVC-%Pred-Post: 105 %
FVC-%Change-Post: 4 %
FVC-%Pred-Post: 74 %
FVC-%Pred-Pre: 71 %
FVC-Post: 3.57 L
FVC-Pre: 3.41 L
POST FEV1/FVC RATIO: 78 %
PRE FEV1/FVC RATIO: 75 %
Post FEV6/FVC ratio: 100 %
Pre FEV6/FVC Ratio: 100 %
RV % PRED: 80 %
RV: 1.93 L
TLC % pred: 75 %
TLC: 5.42 L

## 2015-11-11 NOTE — Progress Notes (Signed)
PFT done today. 

## 2015-11-14 NOTE — Telephone Encounter (Signed)
Informed patient of results and verbal understanding expressed.  Pulmonary referral placed. Patient agrees with treatment plan. 

## 2015-11-14 NOTE — Telephone Encounter (Signed)
-----   Message from [name], MD sent at 11/12/2015  3:26 PM EDT ----- PFT are abnormal with ? Interstitial lung process - please refer to Dr. Chase Caller for further evaluation on whether to continue Amio

## 2015-11-24 ENCOUNTER — Other Ambulatory Visit: Payer: Self-pay | Admitting: Internal Medicine

## 2015-11-24 ENCOUNTER — Other Ambulatory Visit: Payer: Self-pay | Admitting: Cardiology

## 2015-11-25 NOTE — Telephone Encounter (Signed)
Per OV with [name]:  4. HTN - Bp controlled on current medical regimen and he complains that he has been having problems with dizziness on occasion with SBP in the low 100s. I have recommended that he cut his amlodipine back to 5mg  daily. Continue BB/Diovan  5. OSA on CPAP but not very compliant. I recommended that he try to be more compliant with his device to cut back on episodes of afib.

## 2015-11-30 ENCOUNTER — Other Ambulatory Visit: Payer: Self-pay | Admitting: Internal Medicine

## 2015-12-03 ENCOUNTER — Other Ambulatory Visit: Payer: Self-pay | Admitting: Physician Assistant

## 2015-12-03 ENCOUNTER — Encounter: Payer: Self-pay | Admitting: Physician Assistant

## 2015-12-03 ENCOUNTER — Other Ambulatory Visit (INDEPENDENT_AMBULATORY_CARE_PROVIDER_SITE_OTHER): Payer: Medicare Other

## 2015-12-03 ENCOUNTER — Ambulatory Visit (INDEPENDENT_AMBULATORY_CARE_PROVIDER_SITE_OTHER): Payer: Medicare Other | Admitting: Physician Assistant

## 2015-12-03 ENCOUNTER — Encounter: Payer: Self-pay | Admitting: *Deleted

## 2015-12-03 VITALS — BP 144/76 | HR 78 | Ht 72.0 in | Wt 256.1 lb

## 2015-12-03 DIAGNOSIS — I481 Persistent atrial fibrillation: Secondary | ICD-10-CM

## 2015-12-03 DIAGNOSIS — I480 Paroxysmal atrial fibrillation: Secondary | ICD-10-CM | POA: Diagnosis not present

## 2015-12-03 DIAGNOSIS — E119 Type 2 diabetes mellitus without complications: Secondary | ICD-10-CM | POA: Diagnosis not present

## 2015-12-03 DIAGNOSIS — E785 Hyperlipidemia, unspecified: Secondary | ICD-10-CM

## 2015-12-03 DIAGNOSIS — I10 Essential (primary) hypertension: Secondary | ICD-10-CM | POA: Diagnosis not present

## 2015-12-03 DIAGNOSIS — I2510 Atherosclerotic heart disease of native coronary artery without angina pectoris: Secondary | ICD-10-CM

## 2015-12-03 DIAGNOSIS — R600 Localized edema: Secondary | ICD-10-CM

## 2015-12-03 DIAGNOSIS — I4819 Other persistent atrial fibrillation: Secondary | ICD-10-CM

## 2015-12-03 LAB — CBC WITH DIFFERENTIAL/PLATELET
BASOS PCT: 0 %
Basophils Absolute: 0 cells/uL (ref 0–200)
EOS ABS: 174 {cells}/uL (ref 15–500)
Eosinophils Relative: 2 %
HEMATOCRIT: 45.6 % (ref 38.5–50.0)
Hemoglobin: 15.5 g/dL (ref 13.2–17.1)
LYMPHS PCT: 20 %
Lymphs Abs: 1740 cells/uL (ref 850–3900)
MCH: 34.2 pg — AB (ref 27.0–33.0)
MCHC: 34 g/dL (ref 32.0–36.0)
MCV: 100.7 fL — AB (ref 80.0–100.0)
MONO ABS: 609 {cells}/uL (ref 200–950)
MPV: 10.5 fL (ref 7.5–12.5)
Monocytes Relative: 7 %
Neutro Abs: 6177 cells/uL (ref 1500–7800)
Neutrophils Relative %: 71 %
PLATELETS: 246 10*3/uL (ref 140–400)
RBC: 4.53 MIL/uL (ref 4.20–5.80)
RDW: 15.2 % — AB (ref 11.0–15.0)
WBC: 8.7 10*3/uL (ref 3.8–10.8)

## 2015-12-03 LAB — LIPID PANEL
CHOLESTEROL: 90 mg/dL — AB (ref 125–200)
HDL: 39 mg/dL — ABNORMAL LOW (ref >=40)
LDL Cholesterol: 31 mg/dL (ref <130)
Total CHOL/HDL Ratio: 2.3 Ratio (ref <=5.0)
Triglycerides: 99 mg/dL (ref <150)
VLDL: 20 mg/dL (ref <30)

## 2015-12-03 LAB — BASIC METABOLIC PANEL
BUN: 22 mg/dL (ref 7–25)
CHLORIDE: 104 mmol/L (ref 98–110)
CO2: 28 mmol/L (ref 20–31)
Calcium: 9.4 mg/dL (ref 8.6–10.3)
Creat: 1.35 mg/dL — ABNORMAL HIGH (ref 0.70–1.25)
Glucose, Bld: 117 mg/dL — ABNORMAL HIGH (ref 65–99)
POTASSIUM: 4.4 mmol/L (ref 3.5–5.3)
Sodium: 143 mmol/L (ref 135–146)

## 2015-12-03 LAB — HEPATIC FUNCTION PANEL
ALBUMIN: 4.1 g/dL (ref 3.6–5.1)
ALT: 16 U/L (ref 9–46)
AST: 17 U/L (ref 10–35)
Alkaline Phosphatase: 89 U/L (ref 40–115)
BILIRUBIN DIRECT: 0.3 mg/dL — AB (ref <=0.2)
Indirect Bilirubin: 0.9 mg/dL (ref 0.2–1.2)
TOTAL PROTEIN: 7 g/dL (ref 6.1–8.1)
Total Bilirubin: 1.2 mg/dL (ref 0.2–1.2)

## 2015-12-03 LAB — PROTIME-INR
INR: 1.2 — ABNORMAL HIGH
Prothrombin Time: 12.7 s — ABNORMAL HIGH (ref 9.0–11.5)

## 2015-12-03 NOTE — Progress Notes (Signed)
Cardiology Office Note    Date:  12/03/2015   ID:  Christopher Wade, DOB 08/30/1950, MRN [id number]  PCP:  Alesia Richards, MD  Cardiologist:  [name]   Chief Complaint  Patient presents with  . Follow-up    seen for [name]    History of Present Illness:  Christopher Wade is a 65 y.o. male with PMH of Persistent atrial fibrillation, HTN, HLD, and DM. He underwent DC cardioversion in November 2016 to normal sinus rhythm, however before discharge, he went back into atrial fibrillation. He was seen in the clinic later for consideration of antiarrhythmic therapy, a stress test was ordered which showed inferolateral ischemia. He underwent cardiac catheterization which showed 50% left main, 40% distal LAD, 70% ostial to proximal left circumflex, 40% mid LAD and 95% mid to distal RCA treated with DES 2. Flow wire of LAD was normal with FFR of 0.87. He underwent staged PCI of left circumflex. He was restarted on Xarelto on 5/24, he was initially on triple therapy with aspirin, Plavix and Xarelto, he was instructed to stop aspirin and 6/22 after one month.  He was last seen in the clinic on 11/05/2015 for discussion of antiarrhythmic medication. We were unable to use Flexeril night due to CAD. Options including amiodarone, sotalol or Tikosyn. Eventually after learning risk and benefit of each, he opted to start on amiodarone. His TSH and LFT was normal. He was instructed to get a yearly eye exam. LFT obtained on 11/11/2015 was somewhat abnormal with questionable interstitial lung process. He was referred to Dr. Chase Caller on 8/21. He was started on amiodarone 200 mg twice a day. The plan is to obtain EKG today if he is still in atrial fibrillation, then we will arrange outpatient DC cardioversion.   He presents today for cardiology office visit. EKG today shows atrial fibrillation with heart rate of 78. He has no cardiac redness. He has chronic 2+ lower extremity pitting edema which he says  resolve in the morning after a night's rest and worsens throughout the day. He is not currently on diuretic, he has no orthopnea PND episodes. We'll continue monitoring at this time and try conservative therapy instead.   I will reconfirm this timing for cardioversion with [name] given the fact that he may potentially be evaluated by pulmonology regarding possible interstitial lung disease.   Past Medical History  Diagnosis Date  . Hypertension   . Hyperlipidemia   . Diabetes mellitus without complication ([place])   . Thyroid disease   . Vitamin D deficiency   . Allergy   . Arthritis     lumbar  . Atrial fibrillation, persistent ([place])     s/p DCCV 03/2015 with reversion back to atrial fibrillation  . OSA (obstructive sleep apnea)     noncompliant with CPAP  . PAF (paroxysmal atrial fibrillation) ([place])   . CAD (coronary artery disease)     cath 10/11/2015 50% left main, 40% distal LAD, 70% ostial to proximal left circumflex, 40% mid LAD and 95% mid to distal RCA treated with DES 2. Flow wire of LAD was normal with FFR of 0.87. Staged PCI of LCx    Past Surgical History  Procedure Laterality Date  . Joint replacement Bilateral 2001    Hips  . Cardioversion N/A 04/05/2015    Procedure: CARDIOVERSION;  Surgeon: [name], MD;  Location: [facility] ENDOSCOPY;  Service: Cardiovascular;  Laterality: N/A;  . Cardiac catheterization N/A 10/11/2015    Procedure: Left  Heart Cath and Coronary Angiography;  Surgeon: [name], MD;  Location: [place] CV LAB;  Service: Cardiovascular;  Laterality: N/A;  . Cardiac catheterization N/A 10/11/2015    Procedure: Coronary Stent Intervention;  Surgeon: [name], MD;  Location: [place] CV LAB;  Service: Cardiovascular;  Laterality: N/A;  . Cardiac catheterization N/A 10/11/2015    Procedure: Intravascular Pressure Wire/FFR Study;  Surgeon: [name], MD;  Location: [place] CV LAB;  Service: Cardiovascular;  Laterality: N/A;  .  Cardiac catheterization N/A 10/14/2015    Procedure: Coronary Stent Intervention;  Surgeon: [name], MD;  Location: [place] CV LAB;  Service: Cardiovascular;  Laterality: N/A;    Current Medications: Outpatient Prescriptions Prior to Visit  Medication Sig Dispense Refill  . allopurinol (ZYLOPRIM) 300 MG tablet take 1 tablet by mouth once daily 30 tablet 2  . amiodarone (PACERONE) 200 MG tablet Take 1 tablet (200 mg total) by mouth 2 (two) times daily. 60 tablet 11  . amLODipine (NORVASC) 5 MG tablet Take 1 tablet (5 mg total) by mouth daily. 30 tablet 11  . atenolol (TENORMIN) 100 MG tablet take 1/2 tablet by mouth twice a day 90 tablet 3  . atorvastatin (LIPITOR) 80 MG tablet take 1 tablet by mouth once daily AT 6 PM 90 tablet 0  . B Complex Vitamins (B COMPLEX PO) Take 1 tablet by mouth daily.    . cholecalciferol (VITAMIN D) 1000 UNITS tablet Take 1,000 Units by mouth daily.    . clopidogrel (PLAVIX) 75 MG tablet Take 1 tablet (75 mg total) by mouth daily with breakfast. 30 tablet 11  . colchicine 0.6 MG tablet take 1 tablet by mouth daily to PREVENT gout 90 tablet 0  . HYDROcodone-acetaminophen (NORCO/VICODIN) 5-325 MG tablet Take 1 tablet by mouth every 6 (six) hours as needed for moderate pain. 90 tablet 0  . levothyroxine (SYNTHROID, LEVOTHROID) 50 MCG tablet take 1 tablet by mouth once daily 90 tablet 0  . loratadine (CLARITIN) 10 MG tablet Take 10 mg by mouth daily.    . magnesium gluconate (MAGONATE) 500 MG tablet Take 500 mg by mouth 3 (three) times daily.     . metFORMIN (GLUCOPHAGE) 500 MG tablet take 1 tablet by mouth twice a day 60 tablet 3  . nystatin cream (MYCOSTATIN) Apply 1 application topically 2 (two) times daily as needed (wound care). 30 g 1  . Omega-3 Fatty Acids (FISH OIL) 1200 MG CAPS Take 1,200 mg by mouth daily.    . rivaroxaban (XARELTO) 20 MG TABS tablet Take 1 tablet (20 mg total) by mouth daily with supper. 30 tablet 4  . valsartan (DIOVAN) 320  MG tablet take 1 tablet by mouth once daily 90 tablet 0   No facility-administered medications prior to visit.     Allergies:   Bee venom   Social History   Social History  . Marital Status: Single    Spouse Name: N/A  . Number of Children: N/A  . Years of Education: N/A   Social History Main Topics  . Smoking status: Never Smoker   . Smokeless tobacco: Never Used  . Alcohol Use: No  . Drug Use: No  . Sexual Activity: Not Currently   Other Topics Concern  . None   Social History Narrative     Family History:  The patient's  family history includes Cancer (age of onset: 42) in his father; Cancer (age of onset: 76) in his mother; Cancer (age  of onset: 21) in his sister.   ROS:   Please see the history of present illness.    ROS All other systems reviewed and are negative.   PHYSICAL EXAM:   VS:  BP 144/76 mmHg  Pulse 78  Ht 6' (1.829 m)  Wt 256 lb 1.9 oz (116.175 kg)  BMI 34.73 kg/m2   GEN: Well nourished, well developed, in no acute distress HEENT: normal Neck: no JVD, carotid bruits, or masses Cardiac: irregularly irregular; no murmurs, rubs, or gallops. 2+ pitting edema bilaterally. Respiratory:  clear to auscultation bilaterally, normal work of breathing GI: soft, nontender, nondistended, + BS MS: no deformity or atrophy Skin: warm and dry, no rash Neuro:  Alert and Oriented x 3, Strength and sensation are intact Psych: euthymic mood, full affect  Wt Readings from Last 3 Encounters:  12/03/15 256 lb 1.9 oz (116.175 kg)  11/05/15 254 lb (115.214 kg)  10/11/15 260 lb (117.935 kg)      Studies/Labs Reviewed:   EKG:  EKG is ordered today.  The ekg ordered today demonstrates afib with HR 78  Recent Labs: 08/29/2015: Magnesium 1.6; TSH 2.28 12/03/2015: ALT 16; BUN 22; Creat 1.35*; Hemoglobin 15.5; Platelets 246; Potassium 4.4; Sodium 143   Lipid Panel    Component Value Date/Time   CHOL 90* 12/03/2015 0950   TRIG 99 12/03/2015 0950   HDL 39* 12/03/2015  0950   CHOLHDL 2.3 12/03/2015 0950   VLDL 20 12/03/2015 0950   LDLCALC 31 12/03/2015 0950    Additional studies/ records that were reviewed today include:   Cath 10/11/2015 Conclusion     Mid RCA to Dist RCA lesion, 95% stenosed. The lesion was not previously treated.  LM lesion, 50% stenosed.  Mid LAD lesion, 40% stenosed.  Dist LAD lesion, 40% stenosed.  Ost Cx to Prox Cx lesion, 70% stenosed.  1st Mrg lesion, 70% stenosed.  Reviewed films with Dr Martinique. LAD does not appear to be tight enough to warrant CABG Will flow wire and if not flow limiting will intervene on RCA today and stage complex circumflex Bifurcation lesions for Monday Keep in hospital over weekend on heparin as he is in afib and xarelto Has been held    Cath 10/11/2015 Conclusion     LM lesion, 50% stenosed.  Dist LAD lesion, 40% stenosed.  Ost Cx to Prox Cx lesion, 70% stenosed.  1st Mrg lesion, 70% stenosed.  Mid LAD lesion, 40% stenosed.  Mid RCA to Dist RCA lesion, 95% stenosed. Post intervention, there is a 0% residual stenosis. The lesion was not previously treated.  1. LAD FFR of 0.87 2. Successful stenting of the mid RCA with DES x 2.  Plan: will heparinize over the weekend and plan on returning on Monday for stenting of the LCx/OM bifurcation. At discharge will resume Xarelto and continue ASA and Plavix. Plan to stop ASA at one month and continue Plavix for one year.     Cath 10/14/2015 Conclusion     Ost 1st Mrg to 1st Mrg lesion to mid circumflex, 95% stenosed. Post intervention with a 2.75 x 32 Synergy, postdilated to 3.5 mm, there is a 0% residual stenosis.  The Synergy stent extends from the OM back into the native circumflex, jailing the mid circumflex. Postprocedure, there is TIMI-3 flow in the circumflex with only a moderate stenosis at the jailed portion.  Continue dual antiplatelet therapy and aggressive secondary prevention. The patient will restart his Xarelto. At  that point, consideration should be made  to stop the aspirin and have him continue Xarelto and clopidogrel long-term.    Echo 04/01/2015 LV EF: 60% - 65%  ------------------------------------------------------------------- Indications: I48.1 Persistent Atrial Fibrillation.  ------------------------------------------------------------------- History: PMH: Acquired from the patient and from the patient&'s chart. PMH: Thyroid Disease. Risk factors: Hypertension. Diabetes mellitus. Dyslipidemia.  ------------------------------------------------------------------- Study Conclusions  - Left ventricle: The cavity size was normal. Wall thickness was  increased in a pattern of mild LVH. Systolic function was normal.  The estimated ejection fraction was in the range of 60% to 65%.  Indeterminant diastolic function (atrial fibrillation). Wall  motion was normal; there were no regional wall motion  abnormalities. - Aortic valve: There was no stenosis. - Aorta: Borderline dilated aortic root. - Mitral valve: There was trivial regurgitation. - Left atrium: The atrium was mildly dilated. - Right ventricle: The cavity size was moderately dilated. Systolic  function was normal. - Right atrium: The atrium was mildly dilated. - Tricuspid valve: Peak RV-RA gradient (S): 32 mm Hg. - Pulmonary arteries: PA peak pressure: 35 mm Hg (S). - Inferior vena cava: The vessel was normal in size. The  respirophasic diameter changes were in the normal range (>= 50%),  consistent with normal central venous pressure.  Impressions:  - The patient was in atrial fibrillation. Normal LV size with mild  LV hypertrophy. EF 60-65%. Moderately dilated RV with normal  systolic function. Borderline pulmonary hypertension.    PFT 11/11/2015   ASSESSMENT:    1. Persistent atrial fibrillation ([place])   2. Essential hypertension   3. Hyperlipidemia   4. Type 2 diabetes mellitus without  complication, without long-term current use of insulin ([place])   5. Coronary artery disease involving native coronary artery of native heart without angina pectoris   6. Bilateral leg edema      PLAN:  In order of problems listed above:  1. Persistent atrial fibrillation on Xarelto  - despite addition of amiodarone, TSH and LFT normal. patient continues to be in afib. He has been compliant with Xarelto since 5/24  -  Will arrange outpatient DCCV as previously recommended by [name], will doubt check with Dr. Lovena Le regarding whether to proceed prior to pulmonology visit with Dr. Chase Caller on 8/21 for abnormal PFT and ?interstitial dx  2. CAD: cath 10/11/2015 50% left main, 40% distal LAD, 70% ostial to proximal left circumflex, 40% mid LAD and 95% mid to distal RCA treated with DES 2. Flow wire of LAD was normal with FFR of 0.87. Staged PCI of LCx. He was on triple therapy with ASA, plavix and Xarelto, ASA stopped after 1 month.   3. HLD: repeat LFT and lipid panel today, LDL goal < 70  4. HTN: controlled on amlodipine, atenolol and valsartan.  5. LE edema: 2+ pitting edema noted bilaterally, per pt, this is chronic and worsen throughout the day with standing. Lung clear, no pulm edema appreciated, continue conservative therapy, if worsen will add lasix.   Medication Adjustments/Labs and Tests Ordered: Current medicines are reviewed at length with the patient today.  Concerns regarding medicines are outlined above.  Medication changes, Labs and Tests ordered today are listed in the Patient Instructions below. Patient Instructions  Medication Instructions:  None ordered  Labwork: TODAY;  BMET, CBC W/DIFF. & PT/INR  Testing/Procedures: Your physician has recommended that you have a Cardioversion (DCCV). Electrical Cardioversion uses a jolt of electricity to your heart either through paddles or wired patches attached to your chest. This is a controlled, usually prescheduled, procedure.  Defibrillation is done under light anesthesia in the hospital, and you usually go home the day of the procedure. This is done to get your heart back into a normal rhythm. You are not awake for the procedure. Please see the instruction sheet given to you today.   Follow-Up: Your physician recommends that you schedule a follow-up appointment in: 2 WEEKS AFTER 12/09/15 WITH 1ST AVAILABLE EXTENDER   Any Other Special Instructions Will Be Listed Below (If Applicable).  Electrical Cardioversion Electrical cardioversion is the delivery of a jolt of electricity to change the rhythm of the heart. Sticky patches or metal paddles are placed on the chest to deliver the electricity from a device. This is done to restore a normal rhythm. A rhythm that is too fast or not regular keeps the heart from pumping well. Electrical cardioversion is done in an emergency if:   There is low or no blood pressure as a result of the heart rhythm.   Normal rhythm must be restored as fast as possible to protect the brain and heart from further damage.   It may save a life. Cardioversion may be done for heart rhythms that are not immediately life threatening, such as atrial fibrillation or flutter, in which:   The heart is beating too fast or is not regular.   Medicine to change the rhythm has not worked.   It is safe to wait in order to allow time for preparation.  Symptoms of the abnormal rhythm are bothersome.  The risk of stroke and other serious problems can be reduced. LET [facility] CARE PROVIDER KNOW ABOUT:   Any allergies you have.  All medicines you are taking, including vitamins, herbs, eye drops, creams, and over-the-counter medicines.  Previous problems you or members of your family have had with the use of anesthetics.   Any blood disorders you have.   Previous surgeries you have had.   Medical conditions you have. RISKS AND COMPLICATIONS  Generally, this is a safe procedure. However,  problems can occur and include:   Breathing problems related to the anesthetic used.  A blood clot that breaks free and travels to other parts of your body. This could cause a stroke or other problems. The risk of this is lowered by use of blood-thinning medicine (anticoagulant) prior to the procedure.  Cardiac arrest (rare). BEFORE THE PROCEDURE   You may have tests to detect blood clots in your heart and to evaluate heart function.  You may start taking anticoagulants so your blood does not clot as easily.   Medicines may be given to help stabilize your heart rate and rhythm. PROCEDURE  You will be given medicine through an IV tube to reduce discomfort and make you sleepy (sedative).   An electrical shock will be delivered. AFTER THE PROCEDURE Your heart rhythm will be watched to make sure it does not change.    This information is not intended to replace advice given to you by your health care provider. Make sure you discuss any questions you have with your health care provider.   Document Released: 05/01/2002 Document Revised: 06/01/2014 Document Reviewed: 11/23/2012 Elsevier Interactive Patient Education [employer].   If you need a refill on your cardiac medications before your next appointment, please call your pharmacy.       Hilbert Corrigan, [place]  12/03/2015 11:33 PM    [facility] [address], [place], [place]  [address] Phone: [phone]; Fax: [phone]

## 2015-12-03 NOTE — Patient Instructions (Addendum)
Medication Instructions:  None ordered  Labwork: TODAY;  BMET, CBC W/DIFF. & PT/INR  Testing/Procedures: Your physician has recommended that you have a Cardioversion (DCCV). Electrical Cardioversion uses a jolt of electricity to your heart either through paddles or wired patches attached to your chest. This is a controlled, usually prescheduled, procedure. Defibrillation is done under light anesthesia in the hospital, and you usually go home the day of the procedure. This is done to get your heart back into a normal rhythm. You are not awake for the procedure. Please see the instruction sheet given to you today.   Follow-Up: Your physician recommends that you schedule a follow-up appointment in: 2 WEEKS AFTER 12/09/15 WITH 1ST AVAILABLE EXTENDER   Any Other Special Instructions Will Be Listed Below (If Applicable).  Electrical Cardioversion Electrical cardioversion is the delivery of a jolt of electricity to change the rhythm of the heart. Sticky patches or metal paddles are placed on the chest to deliver the electricity from a device. This is done to restore a normal rhythm. A rhythm that is too fast or not regular keeps the heart from pumping well. Electrical cardioversion is done in an emergency if:   There is low or no blood pressure as a result of the heart rhythm.   Normal rhythm must be restored as fast as possible to protect the brain and heart from further damage.   It may save a life. Cardioversion may be done for heart rhythms that are not immediately life threatening, such as atrial fibrillation or flutter, in which:   The heart is beating too fast or is not regular.   Medicine to change the rhythm has not worked.   It is safe to wait in order to allow time for preparation.  Symptoms of the abnormal rhythm are bothersome.  The risk of stroke and other serious problems can be reduced. LET [facility] CARE PROVIDER KNOW ABOUT:   Any allergies you have.  All  medicines you are taking, including vitamins, herbs, eye drops, creams, and over-the-counter medicines.  Previous problems you or members of your family have had with the use of anesthetics.   Any blood disorders you have.   Previous surgeries you have had.   Medical conditions you have. RISKS AND COMPLICATIONS  Generally, this is a safe procedure. However, problems can occur and include:   Breathing problems related to the anesthetic used.  A blood clot that breaks free and travels to other parts of your body. This could cause a stroke or other problems. The risk of this is lowered by use of blood-thinning medicine (anticoagulant) prior to the procedure.  Cardiac arrest (rare). BEFORE THE PROCEDURE   You may have tests to detect blood clots in your heart and to evaluate heart function.  You may start taking anticoagulants so your blood does not clot as easily.   Medicines may be given to help stabilize your heart rate and rhythm. PROCEDURE  You will be given medicine through an IV tube to reduce discomfort and make you sleepy (sedative).   An electrical shock will be delivered. AFTER THE PROCEDURE Your heart rhythm will be watched to make sure it does not change.    This information is not intended to replace advice given to you by your health care provider. Make sure you discuss any questions you have with your health care provider.   Document Released: 05/01/2002 Document Revised: 06/01/2014 Document Reviewed: 11/23/2012 Elsevier Interactive Patient Education [employer].   If you  need a refill on your cardiac medications before your next appointment, please call your pharmacy.

## 2015-12-04 ENCOUNTER — Telehealth: Payer: Self-pay | Admitting: *Deleted

## 2015-12-04 DIAGNOSIS — I10 Essential (primary) hypertension: Secondary | ICD-10-CM

## 2015-12-04 NOTE — Telephone Encounter (Signed)
Pt aware of his lab results.  He will come in 12/11/15 to repeat bmet.  His follow up is 12/20/15 and will recheck again at that time. Pt as been advised to drink plenty of fluid. Pt agreeable with this plan

## 2015-12-04 NOTE — Telephone Encounter (Signed)
-----   Message from [place], [place] sent at 12/04/2015  8:42 AM EDT ----- Unclear reason for his kidney function to go down, recommend recheck in 1-2 weeks before his next visit. Note kindey function can worse with too much fluid or too little fluid. He did have 2+ pitting edema on exam during last office visit, if his renal function continue to worsen and he is noted to have increased swelling on followup, we may do a trial of PRN lasix, however if his swelling is stable and kidney function worsening due to dehydration, then he will actually need to drink more fluid. Of course, this has to be determined based on additional lab and followup physical exam. Otherwise, white and red blood cell normal

## 2015-12-09 ENCOUNTER — Encounter (HOSPITAL_COMMUNITY): Payer: Self-pay | Admitting: *Deleted

## 2015-12-09 ENCOUNTER — Ambulatory Visit (HOSPITAL_COMMUNITY)
Admission: RE | Admit: 2015-12-09 | Discharge: 2015-12-09 | Disposition: A | Discharge status: Home / Self Care | Payer: Medicare Other | Source: Ambulatory Visit | Attending: Cardiology | Admitting: Cardiology

## 2015-12-09 ENCOUNTER — Encounter (HOSPITAL_COMMUNITY)
Admission: RE | Disposition: A | Discharge status: Home / Self Care | Payer: Self-pay | Source: Ambulatory Visit | Attending: Cardiology

## 2015-12-09 ENCOUNTER — Ambulatory Visit (HOSPITAL_COMMUNITY): Payer: Medicare Other | Admitting: Anesthesiology

## 2015-12-09 DIAGNOSIS — Z7901 Long term (current) use of anticoagulants: Secondary | ICD-10-CM | POA: Diagnosis not present

## 2015-12-09 DIAGNOSIS — I4891 Unspecified atrial fibrillation: Secondary | ICD-10-CM | POA: Diagnosis present

## 2015-12-09 DIAGNOSIS — Z79899 Other long term (current) drug therapy: Secondary | ICD-10-CM | POA: Insufficient documentation

## 2015-12-09 DIAGNOSIS — Z96643 Presence of artificial hip joint, bilateral: Secondary | ICD-10-CM | POA: Diagnosis not present

## 2015-12-09 DIAGNOSIS — Z7902 Long term (current) use of antithrombotics/antiplatelets: Secondary | ICD-10-CM | POA: Diagnosis not present

## 2015-12-09 DIAGNOSIS — Z7984 Long term (current) use of oral hypoglycemic drugs: Secondary | ICD-10-CM | POA: Diagnosis not present

## 2015-12-09 DIAGNOSIS — E119 Type 2 diabetes mellitus without complications: Secondary | ICD-10-CM | POA: Insufficient documentation

## 2015-12-09 DIAGNOSIS — E785 Hyperlipidemia, unspecified: Secondary | ICD-10-CM | POA: Insufficient documentation

## 2015-12-09 DIAGNOSIS — I481 Persistent atrial fibrillation: Secondary | ICD-10-CM | POA: Insufficient documentation

## 2015-12-09 DIAGNOSIS — I2510 Atherosclerotic heart disease of native coronary artery without angina pectoris: Secondary | ICD-10-CM | POA: Insufficient documentation

## 2015-12-09 DIAGNOSIS — Z955 Presence of coronary angioplasty implant and graft: Secondary | ICD-10-CM | POA: Insufficient documentation

## 2015-12-09 DIAGNOSIS — I10 Essential (primary) hypertension: Secondary | ICD-10-CM | POA: Diagnosis not present

## 2015-12-09 DIAGNOSIS — G4733 Obstructive sleep apnea (adult) (pediatric): Secondary | ICD-10-CM | POA: Diagnosis not present

## 2015-12-09 HISTORY — PX: CARDIOVERSION: SHX1299

## 2015-12-09 SURGERY — CARDIOVERSION
Anesthesia: General

## 2015-12-09 MED ORDER — OXYCODONE HCL 5 MG PO TABS: 5.0000 mg | ORAL_TABLET | Freq: Once | ORAL | Status: DC | PRN

## 2015-12-09 MED ORDER — LIDOCAINE 2% (20 MG/ML) 5 ML SYRINGE
INTRAMUSCULAR | Status: DC | PRN
  Administered 2015-12-09: 60 mg via INTRAVENOUS

## 2015-12-09 MED ORDER — PROPOFOL 10 MG/ML IV BOLUS
INTRAVENOUS | Status: DC | PRN
  Administered 2015-12-09: 100 mg via INTRAVENOUS
  Administered 2015-12-09: 50 mg via INTRAVENOUS

## 2015-12-09 MED ORDER — FENTANYL CITRATE (PF) 100 MCG/2ML IJ SOLN: 25.0000 ug | INTRAMUSCULAR | Status: DC | PRN

## 2015-12-09 MED ORDER — OXYCODONE HCL 5 MG/5ML PO SOLN
5.0000 mg | Freq: Once | ORAL | Status: DC | PRN
  Filled 2015-12-09: qty 5

## 2015-12-09 MED ORDER — ONDANSETRON HCL 4 MG/2ML IJ SOLN: 4.0000 mg | Freq: Once | INTRAMUSCULAR | Status: DC | PRN

## 2015-12-09 MED ORDER — SODIUM CHLORIDE 0.9 % IV SOLN: INTRAVENOUS | Status: DC

## 2015-12-09 NOTE — Discharge Instructions (Signed)
Electrical Cardioversion, Care After °Refer to this sheet in the next few weeks. These instructions provide you with information on caring for yourself after your procedure. Your health care provider may also give you more specific instructions. Your treatment has been planned according to current medical practices, but problems sometimes occur. Call your health care provider if you have any problems or questions after your procedure. °WHAT TO EXPECT AFTER THE PROCEDURE °After your procedure, it is typical to have the following sensations: °· Some redness on the skin where the shocks were delivered. If this is tender, a sunburn lotion or hydrocortisone cream may help. °· Possible return of an abnormal heart rhythm within hours or days after the procedure. °HOME CARE INSTRUCTIONS °· Take medicines only as directed by your health care provider. Be sure you understand how and when to take your medicine. °· Learn how to feel your pulse and check it often. °· Limit your activity for 48 hours after the procedure or as directed by your health care provider. °· Avoid or minimize caffeine and other stimulants as directed by your health care provider. °SEEK MEDICAL CARE IF: °· You feel like your heart is beating too fast or your pulse is not regular. °· You have any questions about your medicines. °· You have bleeding that will not stop. °SEEK IMMEDIATE MEDICAL CARE IF: °· You are dizzy or feel faint. °· It is hard to breathe or you feel short of breath. °· There is a change in discomfort in your chest. °· Your speech is slurred or you have trouble moving an arm or leg on one side of your body. °· You get a serious muscle cramp that does not go away. °· Your fingers or toes turn cold or blue. °  °This information is not intended to replace advice given to you by your health care provider. Make sure you discuss any questions you have with your health care provider. °  °Document Released: 03/01/2013 Document Revised: 06/01/2014  Document Reviewed: 03/01/2013 °Elsevier Interactive Patient Education ©2016 [employer] ° °

## 2015-12-09 NOTE — CV Procedure (Signed)
   Electrical Cardioversion Procedure Note VEDDER DICKERT [id number] Apr 16, 1951  Procedure: Electrical Cardioversion Indications:  Atrial Fibrillation  Time Out: Verified patient identification, verified procedure,medications/allergies/relevent history reviewed, required imaging and test results available.  Performed  Procedure Details  The patient was NPO after midnight. Anesthesia was administered at the beside  by Dr.Rose with 150mg  of propofol and Lidocaine 60mg .  Cardioversion was done with synchronized biphasic defibrillation with AP pads with 150watts.  The patient was unsuccessful at converting to normal sinus rhythm. Cardioversion was done with synchronized biphasic defibrillation with AP pads with 200 watts. The patient was unsuccessful at converting to normal sinus rhythm. Cardioversion was again done with synchronized biphasic defibrillation with AP pads with 200 watts.  The patient was unsuccessful at converting to normal sinus rhythm. The patient tolerated the procedure well   IMPRESSION:  Unsuccessful cardioversion of atrial fibrillation.  Will draw an amiodarone level to see if level is low.  If Amio level low then will reload and repeat DCCV.  If in normal range then will refer to EP.    Bayli Quesinberry 12/09/2015, 12:47 PM

## 2015-12-09 NOTE — Interval H&P Note (Signed)
History and Physical Interval Note:  12/09/2015 12:47 PM  Christopher Wade  has presented today for surgery, with the diagnosis of A FIB  The various methods of treatment have been discussed with the patient and family. After consideration of risks, benefits and other options for treatment, the patient has consented to  Procedure(s): CARDIOVERSION (N/A) as a surgical intervention .  The patient's history has been reviewed, patient examined, no change in status, stable for surgery.  I have reviewed the patient's chart and labs.  Questions were answered to the patient's satisfaction.     Fransico Him

## 2015-12-09 NOTE — Anesthesia Preprocedure Evaluation (Addendum)
Anesthesia Evaluation  Patient identified by MRN, date of birth, ID band Patient awake    Reviewed: Allergy & Precautions, NPO status , Patient's Chart, lab work & pertinent test results  Airway Mallampati: II  TM Distance: >3 FB Neck ROM: Full    Dental no notable dental hx.    Pulmonary    Pulmonary exam normal breath sounds clear to auscultation       Cardiovascular hypertension, Normal cardiovascular exam Rhythm:Regular Rate:Normal     Neuro/Psych    GI/Hepatic   Endo/Other  diabetes  Renal/GU      Musculoskeletal   Abdominal   Peds  Hematology   Anesthesia Other Findings   Reproductive/Obstetrics                            Anesthesia Physical Anesthesia Plan  ASA: III  Anesthesia Plan: General   Post-op Pain Management:    Induction: Intravenous  Airway Management Planned: Mask  Additional Equipment:   Intra-op Plan:   Post-operative Plan:   Informed Consent: I have reviewed the patients History and Physical, chart, labs and discussed the procedure including the risks, benefits and alternatives for the proposed anesthesia with the patient or authorized representative who has indicated his/her understanding and acceptance.     Plan Discussed with: CRNA and Anesthesiologist  Anesthesia Plan Comments:         Anesthesia Quick Evaluation

## 2015-12-09 NOTE — Transfer of Care (Signed)
Immediate Anesthesia Transfer of Care Note  Patient: Christopher Wade  Procedure(s) Performed: Procedure(s): CARDIOVERSION (N/A)  Patient Location: PACU and Endoscopy Unit  Anesthesia Type:MAC  Level of Consciousness: awake, alert , oriented and patient cooperative  Airway & Oxygen Therapy: Patient Spontanous Breathing and Patient connected to nasal cannula oxygen  Post-op Assessment: Report given to RN, Post -op Vital signs reviewed and stable and Patient moving all extremities  Post vital signs: Reviewed and stable  Last Vitals:  Filed Vitals:   12/09/15 1259 12/09/15 1300  BP: 135/61   Pulse: 52 70  Temp:    Resp: 20 20    Last Pain: There were no vitals filed for this visit.       Complications: No apparent anesthesia complications

## 2015-12-09 NOTE — H&P (View-Only) (Signed)
Cardiology Office Note    Date:  12/03/2015   ID:  Christopher Wade, DOB Sep 29, 1950, MRN [id number]  PCP:  Alesia Richards, MD  Cardiologist:  [name]   Chief Complaint  Patient presents with  . Follow-up    seen for [name]    History of Present Illness:  Christopher Wade is a 65 y.o. male with PMH of Persistent atrial fibrillation, HTN, HLD, and DM. He underwent DC cardioversion in November 2016 to normal sinus rhythm, however before discharge, he went back into atrial fibrillation. He was seen in the clinic later for consideration of antiarrhythmic therapy, a stress test was ordered which showed inferolateral ischemia. He underwent cardiac catheterization which showed 50% left main, 40% distal LAD, 70% ostial to proximal left circumflex, 40% mid LAD and 95% mid to distal RCA treated with DES 2. Flow wire of LAD was normal with FFR of 0.87. He underwent staged PCI of left circumflex. He was restarted on Xarelto on 5/24, he was initially on triple therapy with aspirin, Plavix and Xarelto, he was instructed to stop aspirin and 6/22 after one month.  He was last seen in the clinic on 11/05/2015 for discussion of antiarrhythmic medication. We were unable to use Flexeril night due to CAD. Options including amiodarone, sotalol or Tikosyn. Eventually after learning risk and benefit of each, he opted to start on amiodarone. His TSH and LFT was normal. He was instructed to get a yearly eye exam. LFT obtained on 11/11/2015 was somewhat abnormal with questionable interstitial lung process. He was referred to Dr. Chase Caller on 8/21. He was started on amiodarone 200 mg twice a day. The plan is to obtain EKG today if he is still in atrial fibrillation, then we will arrange outpatient DC cardioversion.   He presents today for cardiology office visit. EKG today shows atrial fibrillation with heart rate of 78. He has no cardiac redness. He has chronic 2+ lower extremity pitting edema which he says  resolve in the morning after a night's rest and worsens throughout the day. He is not currently on diuretic, he has no orthopnea PND episodes. We'll continue monitoring at this time and try conservative therapy instead.   I will reconfirm this timing for cardioversion with [name] given the fact that he may potentially be evaluated by pulmonology regarding possible interstitial lung disease.   Past Medical History  Diagnosis Date  . Hypertension   . Hyperlipidemia   . Diabetes mellitus without complication ([place])   . Thyroid disease   . Vitamin D deficiency   . Allergy   . Arthritis     lumbar  . Atrial fibrillation, persistent ([place])     s/p DCCV 03/2015 with reversion back to atrial fibrillation  . OSA (obstructive sleep apnea)     noncompliant with CPAP  . PAF (paroxysmal atrial fibrillation) ([place])   . CAD (coronary artery disease)     cath 10/11/2015 50% left main, 40% distal LAD, 70% ostial to proximal left circumflex, 40% mid LAD and 95% mid to distal RCA treated with DES 2. Flow wire of LAD was normal with FFR of 0.87. Staged PCI of LCx    Past Surgical History  Procedure Laterality Date  . Joint replacement Bilateral 2001    Hips  . Cardioversion N/A 04/05/2015    Procedure: CARDIOVERSION;  Surgeon: [name], MD;  [facility] ENDOSCOPY;  Service: Cardiovascular;  Laterality: N/A;  . Cardiac catheterization N/A 10/11/2015    Procedure: Left  Heart Cath and Coronary Angiography;  Surgeon: [name], MD;  Location: [place] CV LAB;  Service: Cardiovascular;  Laterality: N/A;  . Cardiac catheterization N/A 10/11/2015    Procedure: Coronary Stent Intervention;  Surgeon: [name], MD;  Location: [place] CV LAB;  Service: Cardiovascular;  Laterality: N/A;  . Cardiac catheterization N/A 10/11/2015    Procedure: Intravascular Pressure Wire/FFR Study;  Surgeon: [name], MD;  Location: [place] CV LAB;  Service: Cardiovascular;  Laterality: N/A;  .  Cardiac catheterization N/A 10/14/2015    Procedure: Coronary Stent Intervention;  Surgeon: [name], MD;  Location: [place] CV LAB;  Service: Cardiovascular;  Laterality: N/A;    Current Medications: Outpatient Prescriptions Prior to Visit  Medication Sig Dispense Refill  . allopurinol (ZYLOPRIM) 300 MG tablet take 1 tablet by mouth once daily 30 tablet 2  . amiodarone (PACERONE) 200 MG tablet Take 1 tablet (200 mg total) by mouth 2 (two) times daily. 60 tablet 11  . amLODipine (NORVASC) 5 MG tablet Take 1 tablet (5 mg total) by mouth daily. 30 tablet 11  . atenolol (TENORMIN) 100 MG tablet take 1/2 tablet by mouth twice a day 90 tablet 3  . atorvastatin (LIPITOR) 80 MG tablet take 1 tablet by mouth once daily AT 6 PM 90 tablet 0  . B Complex Vitamins (B COMPLEX PO) Take 1 tablet by mouth daily.    . cholecalciferol (VITAMIN D) 1000 UNITS tablet Take 1,000 Units by mouth daily.    . clopidogrel (PLAVIX) 75 MG tablet Take 1 tablet (75 mg total) by mouth daily with breakfast. 30 tablet 11  . colchicine 0.6 MG tablet take 1 tablet by mouth daily to PREVENT gout 90 tablet 0  . HYDROcodone-acetaminophen (NORCO/VICODIN) 5-325 MG tablet Take 1 tablet by mouth every 6 (six) hours as needed for moderate pain. 90 tablet 0  . levothyroxine (SYNTHROID, LEVOTHROID) 50 MCG tablet take 1 tablet by mouth once daily 90 tablet 0  . loratadine (CLARITIN) 10 MG tablet Take 10 mg by mouth daily.    . magnesium gluconate (MAGONATE) 500 MG tablet Take 500 mg by mouth 3 (three) times daily.     . metFORMIN (GLUCOPHAGE) 500 MG tablet take 1 tablet by mouth twice a day 60 tablet 3  . nystatin cream (MYCOSTATIN) Apply 1 application topically 2 (two) times daily as needed (wound care). 30 g 1  . Omega-3 Fatty Acids (FISH OIL) 1200 MG CAPS Take 1,200 mg by mouth daily.    . rivaroxaban (XARELTO) 20 MG TABS tablet Take 1 tablet (20 mg total) by mouth daily with supper. 30 tablet 4  . valsartan (DIOVAN) 320  MG tablet take 1 tablet by mouth once daily 90 tablet 0   No facility-administered medications prior to visit.     Allergies:   Bee venom   Social History   Social History  . Marital Status: Single    Spouse Name: N/A  . Number of Children: N/A  . Years of Education: N/A   Social History Main Topics  . Smoking status: Never Smoker   . Smokeless tobacco: Never Used  . Alcohol Use: No  . Drug Use: No  . Sexual Activity: Not Currently   Other Topics Concern  . None   Social History Narrative     Family History:  The patient's  family history includes Cancer (age of onset: 14) in his father; Cancer (age of onset: 64) in his mother; Cancer (age  of onset: 53) in his sister.   ROS:   Please see the history of present illness.    ROS All other systems reviewed and are negative.   PHYSICAL EXAM:   VS:  BP 144/76 mmHg  Pulse 78  Ht 6' (1.829 m)  Wt 256 lb 1.9 oz (116.175 kg)  BMI 34.73 kg/m2   GEN: Well nourished, well developed, in no acute distress HEENT: normal Neck: no JVD, carotid bruits, or masses Cardiac: irregularly irregular; no murmurs, rubs, or gallops. 2+ pitting edema bilaterally. Respiratory:  clear to auscultation bilaterally, normal work of breathing GI: soft, nontender, nondistended, + BS MS: no deformity or atrophy Skin: warm and dry, no rash Neuro:  Alert and Oriented x 3, Strength and sensation are intact Psych: euthymic mood, full affect  Wt Readings from Last 3 Encounters:  12/03/15 256 lb 1.9 oz (116.175 kg)  11/05/15 254 lb (115.214 kg)  10/11/15 260 lb (117.935 kg)      Studies/Labs Reviewed:   EKG:  EKG is ordered today.  The ekg ordered today demonstrates afib with HR 78  Recent Labs: 08/29/2015: Magnesium 1.6; TSH 2.28 12/03/2015: ALT 16; BUN 22; Creat 1.35*; Hemoglobin 15.5; Platelets 246; Potassium 4.4; Sodium 143   Lipid Panel    Component Value Date/Time   CHOL 90* 12/03/2015 0950   TRIG 99 12/03/2015 0950   HDL 39* 12/03/2015  0950   CHOLHDL 2.3 12/03/2015 0950   VLDL 20 12/03/2015 0950   LDLCALC 31 12/03/2015 0950    Additional studies/ records that were reviewed today include:   Cath 10/11/2015 Conclusion     Mid RCA to Dist RCA lesion, 95% stenosed. The lesion was not previously treated.  LM lesion, 50% stenosed.  Mid LAD lesion, 40% stenosed.  Dist LAD lesion, 40% stenosed.  Ost Cx to Prox Cx lesion, 70% stenosed.  1st Mrg lesion, 70% stenosed.  Reviewed films with Dr Martinique. LAD does not appear to be tight enough to warrant CABG Will flow wire and if not flow limiting will intervene on RCA today and stage complex circumflex Bifurcation lesions for Monday Keep in hospital over weekend on heparin as he is in afib and xarelto Has been held    Cath 10/11/2015 Conclusion     LM lesion, 50% stenosed.  Dist LAD lesion, 40% stenosed.  Ost Cx to Prox Cx lesion, 70% stenosed.  1st Mrg lesion, 70% stenosed.  Mid LAD lesion, 40% stenosed.  Mid RCA to Dist RCA lesion, 95% stenosed. Post intervention, there is a 0% residual stenosis. The lesion was not previously treated.  1. LAD FFR of 0.87 2. Successful stenting of the mid RCA with DES x 2.  Plan: will heparinize over the weekend and plan on returning on Monday for stenting of the LCx/OM bifurcation. At discharge will resume Xarelto and continue ASA and Plavix. Plan to stop ASA at one month and continue Plavix for one year.     Cath 10/14/2015 Conclusion     Ost 1st Mrg to 1st Mrg lesion to mid circumflex, 95% stenosed. Post intervention with a 2.75 x 32 Synergy, postdilated to 3.5 mm, there is a 0% residual stenosis.  The Synergy stent extends from the OM back into the native circumflex, jailing the mid circumflex. Postprocedure, there is TIMI-3 flow in the circumflex with only a moderate stenosis at the jailed portion.  Continue dual antiplatelet therapy and aggressive secondary prevention. The patient will restart his Xarelto. At  that point, consideration should be made  to stop the aspirin and have him continue Xarelto and clopidogrel long-term.    Echo 04/01/2015 LV EF: 60% - 65%  ------------------------------------------------------------------- Indications: I48.1 Persistent Atrial Fibrillation.  ------------------------------------------------------------------- History: PMH: Acquired from the patient and from the patient&'s chart. PMH: Thyroid Disease. Risk factors: Hypertension. Diabetes mellitus. Dyslipidemia.  ------------------------------------------------------------------- Study Conclusions  - Left ventricle: The cavity size was normal. Wall thickness was  increased in a pattern of mild LVH. Systolic function was normal.  The estimated ejection fraction was in the range of 60% to 65%.  Indeterminant diastolic function (atrial fibrillation). Wall  motion was normal; there were no regional wall motion  abnormalities. - Aortic valve: There was no stenosis. - Aorta: Borderline dilated aortic root. - Mitral valve: There was trivial regurgitation. - Left atrium: The atrium was mildly dilated. - Right ventricle: The cavity size was moderately dilated. Systolic  function was normal. - Right atrium: The atrium was mildly dilated. - Tricuspid valve: Peak RV-RA gradient (S): 32 mm Hg. - Pulmonary arteries: PA peak pressure: 35 mm Hg (S). - Inferior vena cava: The vessel was normal in size. The  respirophasic diameter changes were in the normal range (>= 50%),  consistent with normal central venous pressure.  Impressions:  - The patient was in atrial fibrillation. Normal LV size with mild  LV hypertrophy. EF 60-65%. Moderately dilated RV with normal  systolic function. Borderline pulmonary hypertension.    PFT 11/11/2015   ASSESSMENT:    1. Persistent atrial fibrillation ([place])   2. Essential hypertension   3. Hyperlipidemia   4. Type 2 diabetes mellitus without  complication, without long-term current use of insulin ([place])   5. Coronary artery disease involving native coronary artery of native heart without angina pectoris   6. Bilateral leg edema      PLAN:  In order of problems listed above:  1. Persistent atrial fibrillation on Xarelto  - despite addition of amiodarone, TSH and LFT normal. patient continues to be in afib. He has been compliant with Xarelto since 5/24  -  Will arrange outpatient DCCV as previously recommended by [name], will doubt check with Dr. Lovena Le regarding whether to proceed prior to pulmonology visit with Dr. Chase Caller on 8/21 for abnormal PFT and ?interstitial dx  2. CAD: cath 10/11/2015 50% left main, 40% distal LAD, 70% ostial to proximal left circumflex, 40% mid LAD and 95% mid to distal RCA treated with DES 2. Flow wire of LAD was normal with FFR of 0.87. Staged PCI of LCx. He was on triple therapy with ASA, plavix and Xarelto, ASA stopped after 1 month.   3. HLD: repeat LFT and lipid panel today, LDL goal < 70  4. HTN: controlled on amlodipine, atenolol and valsartan.  5. LE edema: 2+ pitting edema noted bilaterally, per pt, this is chronic and worsen throughout the day with standing. Lung clear, no pulm edema appreciated, continue conservative therapy, if worsen will add lasix.   Medication Adjustments/Labs and Tests Ordered: Current medicines are reviewed at length with the patient today.  Concerns regarding medicines are outlined above.  Medication changes, Labs and Tests ordered today are listed in the Patient Instructions below. Patient Instructions  Medication Instructions:  None ordered  Labwork: TODAY;  BMET, CBC W/DIFF. & PT/INR  Testing/Procedures: Your physician has recommended that you have a Cardioversion (DCCV). Electrical Cardioversion uses a jolt of electricity to your heart either through paddles or wired patches attached to your chest. This is a controlled, usually prescheduled, procedure.  Defibrillation is done under light anesthesia in the hospital, and you usually go home the day of the procedure. This is done to get your heart back into a normal rhythm. You are not awake for the procedure. Please see the instruction sheet given to you today.   Follow-Up: Your physician recommends that you schedule a follow-up appointment in: 2 WEEKS AFTER 12/09/15 WITH 1ST AVAILABLE EXTENDER   Any Other Special Instructions Will Be Listed Below (If Applicable).  Electrical Cardioversion Electrical cardioversion is the delivery of a jolt of electricity to change the rhythm of the heart. Sticky patches or metal paddles are placed on the chest to deliver the electricity from a device. This is done to restore a normal rhythm. A rhythm that is too fast or not regular keeps the heart from pumping well. Electrical cardioversion is done in an emergency if:   There is low or no blood pressure as a result of the heart rhythm.   Normal rhythm must be restored as fast as possible to protect the brain and heart from further damage.   It may save a life. Cardioversion may be done for heart rhythms that are not immediately life threatening, such as atrial fibrillation or flutter, in which:   The heart is beating too fast or is not regular.   Medicine to change the rhythm has not worked.   It is safe to wait in order to allow time for preparation.  Symptoms of the abnormal rhythm are bothersome.  The risk of stroke and other serious problems can be reduced. LET [facility] CARE PROVIDER KNOW ABOUT:   Any allergies you have.  All medicines you are taking, including vitamins, herbs, eye drops, creams, and over-the-counter medicines.  Previous problems you or members of your family have had with the use of anesthetics.   Any blood disorders you have.   Previous surgeries you have had.   Medical conditions you have. RISKS AND COMPLICATIONS  Generally, this is a safe procedure. However,  problems can occur and include:   Breathing problems related to the anesthetic used.  A blood clot that breaks free and travels to other parts of your body. This could cause a stroke or other problems. The risk of this is lowered by use of blood-thinning medicine (anticoagulant) prior to the procedure.  Cardiac arrest (rare). BEFORE THE PROCEDURE   You may have tests to detect blood clots in your heart and to evaluate heart function.  You may start taking anticoagulants so your blood does not clot as easily.   Medicines may be given to help stabilize your heart rate and rhythm. PROCEDURE  You will be given medicine through an IV tube to reduce discomfort and make you sleepy (sedative).   An electrical shock will be delivered. AFTER THE PROCEDURE Your heart rhythm will be watched to make sure it does not change.    This information is not intended to replace advice given to you by your health care provider. Make sure you discuss any questions you have with your health care provider.   Document Released: 05/01/2002 Document Revised: 06/01/2014 Document Reviewed: 11/23/2012 Elsevier Interactive Patient Education [employer].   If you need a refill on your cardiac medications before your next appointment, please call your pharmacy.       Hilbert Corrigan, [place]  12/03/2015 11:33 PM    [facility] [address], [place], [place]  [address] Phone: [phone]; Fax: [phone]

## 2015-12-09 NOTE — Anesthesia Postprocedure Evaluation (Signed)
Anesthesia Post Note  Patient: Christopher Wade  Procedure(s) Performed: Procedure(s) (LRB): CARDIOVERSION (N/A)  Patient location during evaluation: PACU Anesthesia Type: General Level of consciousness: awake and alert Pain management: pain level controlled Vital Signs Assessment: post-procedure vital signs reviewed and stable Respiratory status: spontaneous breathing, nonlabored ventilation, respiratory function stable and patient connected to nasal cannula oxygen Cardiovascular status: blood pressure returned to baseline and stable Postop Assessment: no signs of nausea or vomiting Anesthetic complications: no    Last Vitals:  Filed Vitals:   12/09/15 1320 12/09/15 1330  BP: 170/75 169/91  Pulse: 71 44  Temp:    Resp: 15 24    Last Pain: There were no vitals filed for this visit.               Rodolfo Notaro S

## 2015-12-10 ENCOUNTER — Encounter (HOSPITAL_COMMUNITY): Payer: Self-pay | Admitting: Cardiology

## 2015-12-10 ENCOUNTER — Ambulatory Visit: Payer: Self-pay | Admitting: Physician Assistant

## 2015-12-10 ENCOUNTER — Ambulatory Visit (INDEPENDENT_AMBULATORY_CARE_PROVIDER_SITE_OTHER): Payer: Medicare Other | Admitting: Physician Assistant

## 2015-12-10 VITALS — BP 136/78 | HR 82 | Temp 97.3°F | Resp 16 | Ht 72.0 in | Wt 259.8 lb

## 2015-12-10 DIAGNOSIS — M109 Gout, unspecified: Secondary | ICD-10-CM

## 2015-12-10 DIAGNOSIS — E559 Vitamin D deficiency, unspecified: Secondary | ICD-10-CM | POA: Diagnosis not present

## 2015-12-10 DIAGNOSIS — E079 Disorder of thyroid, unspecified: Secondary | ICD-10-CM | POA: Diagnosis not present

## 2015-12-10 DIAGNOSIS — N182 Chronic kidney disease, stage 2 (mild): Secondary | ICD-10-CM | POA: Diagnosis not present

## 2015-12-10 DIAGNOSIS — Z79899 Other long term (current) drug therapy: Secondary | ICD-10-CM

## 2015-12-10 DIAGNOSIS — I209 Angina pectoris, unspecified: Secondary | ICD-10-CM

## 2015-12-10 DIAGNOSIS — I480 Paroxysmal atrial fibrillation: Secondary | ICD-10-CM

## 2015-12-10 DIAGNOSIS — E785 Hyperlipidemia, unspecified: Secondary | ICD-10-CM | POA: Diagnosis not present

## 2015-12-10 DIAGNOSIS — I10 Essential (primary) hypertension: Secondary | ICD-10-CM

## 2015-12-10 DIAGNOSIS — E1122 Type 2 diabetes mellitus with diabetic chronic kidney disease: Secondary | ICD-10-CM | POA: Diagnosis not present

## 2015-12-10 LAB — CBC WITH DIFFERENTIAL/PLATELET
BASOS PCT: 0 %
Basophils Absolute: 0 cells/uL (ref 0–200)
EOS ABS: 87 {cells}/uL (ref 15–500)
Eosinophils Relative: 1 %
HCT: 44.5 % (ref 38.5–50.0)
Hemoglobin: 15.2 g/dL (ref 13.2–17.1)
Lymphocytes Relative: 20 %
Lymphs Abs: 1740 cells/uL (ref 850–3900)
MCH: 34 pg — ABNORMAL HIGH (ref 27.0–33.0)
MCHC: 34.2 g/dL (ref 32.0–36.0)
MCV: 99.6 fL (ref 80.0–100.0)
MONOS PCT: 10 %
MPV: 11 fL (ref 7.5–12.5)
Monocytes Absolute: 870 cells/uL (ref 200–950)
NEUTROS ABS: 6003 {cells}/uL (ref 1500–7800)
Neutrophils Relative %: 69 %
PLATELETS: 228 10*3/uL (ref 140–400)
RBC: 4.47 MIL/uL (ref 4.20–5.80)
RDW: 15.5 % — ABNORMAL HIGH (ref 11.0–15.0)
WBC: 8.7 10*3/uL (ref 3.8–10.8)

## 2015-12-10 LAB — AMIODARONE LEVEL
AMIODARONE LVL: 1.1 ug/mL (ref 1.0–2.5)
N-Desethyl-Amiodarone: 0.4 ug/mL — ABNORMAL LOW (ref 1.0–2.5)

## 2015-12-10 NOTE — Progress Notes (Signed)
Assessment and Plan:  1. Hypertension -Continue medication, monitor blood pressure at home. Continue DASH diet.  Reminder to go to the ER if any CP, SOB, nausea, dizziness, severe HA, changes vision/speech, left arm numbness and tingling and jaw pain.  2. Cholesterol -Continue diet and exercise. Check cholesterol.   3. Diabetes with diabetic chronic kidney disease -Continue diet and exercise. Check A1C Recheck BMP/GFR  4. Vitamin D Def - check level and continue medications.   5. Hypothyroidism -check TSH level, continue medications the same, reminded to take on an empty stomach 30-21mins before food.   6. Obesity with co morbidities - long discussion about weight loss, diet, and exercise  7. Gout - recheck Uric acid as needed, Diet discussed, continue medications.  8. Afib rate controlled Continue following up with cardio, still unable to tolerate CPAP due to dryness.  Still in afib after DCCV yesterday, but states energy and breathing is better since stenting Patient is wondering about switching from xarelto to something cheaper, will contact cardiology  9. CAD Control blood pressure, cholesterol, glucose, increase exercise.  Breathing/exercise tolerance better  10. ARF Recheck renal function will send to cardio  Continue diet and meds as discussed. Further disposition pending results of labs. Discussed med's effects and SE's.    HPI 65 y.o. male  presents for 3 month follow up  has a past medical history of Hypertension; Hyperlipidemia; Diabetes mellitus without complication ([place]); Thyroid disease; Vitamin D deficiency; Allergy; Arthritis; Atrial fibrillation, persistent ([facility]); OSA (obstructive sleep apnea); PAF (paroxysmal atrial fibrillation) ([place]); and CAD (coronary artery disease).  His blood pressure has been controlled at home, today his BP is BP: 136/78 mmHg.  He does workout, he is very active at work.   He is on xarelto and rate is controlled. He had CAD s/p stent  with DES x 2 to mid LAD and mid/dis RCA 10/14/2015 and follow up setnt to LCx/OM. And he had subsequent DCCV that he has failed on 03/2015 and on 12/09/2015.  He states that since his stents however he has less fatigue and less SOB, feels he can do more.  He is following with pulmonary now due to abnormal PFTs showing possible obstructive disease .   He is on cholesterol medication, fenofibrate stopped due to myalgias and elevated CPK. His cholesterol is not at goal. The cholesterol was:   Lab Results  Component Value Date   CHOL 90* 12/03/2015   HDL 39* 12/03/2015   LDLCALC 31 12/03/2015   TRIG 99 12/03/2015   CHOLHDL 2.3 12/03/2015   Lab Results  Component Value Date   CKTOTAL 73 08/20/2014   His last kidney function was worse, will recheck  Lab Results  Component Value Date   GFRNONAA >60 10/15/2015   He was started on allopurinol for elevated uric acid. Lab Results  Component Value Date   LABURIC 6.1 05/16/2015    Very physical with his job and uses vicodin PRN, can not use NSAIDS.   He has been working on diet and exercise for diabetes with diabetic chronic kidney disease, he is on bASA, he is on ACE/ARB, and denies  paresthesia of the feet and polydipsia. Last A1C was:  Lab Results  Component Value Date   HGBA1C 6.1* 08/29/2015  Patient is on Vitamin D supplement. Lab Results  Component Value Date   VD25OH 57 11/12/2014  He is on thyroid medication. His medication was not changed last visit.   Lab Results  Component Value Date   TSH  2.28 08/29/2015  BMI is Body mass index is 35.23 kg/(m^2)., he is working on diet and exercise. Wt Readings from Last 3 Encounters:  12/10/15 259 lb 12.8 oz (117.845 kg)  12/03/15 256 lb 1.9 oz (116.175 kg)  11/05/15 254 lb (115.214 kg)    Current Medications:  Current Outpatient Prescriptions on File Prior to Visit  Medication Sig Dispense Refill  . allopurinol (ZYLOPRIM) 300 MG tablet take 1 tablet by mouth once daily 30 tablet 2   . amiodarone (PACERONE) 200 MG tablet Take 1 tablet (200 mg total) by mouth 2 (two) times daily. 60 tablet 11  . amLODipine (NORVASC) 5 MG tablet Take 1 tablet (5 mg total) by mouth daily. 30 tablet 11  . atenolol (TENORMIN) 100 MG tablet take 1/2 tablet by mouth twice a day 90 tablet 3  . atorvastatin (LIPITOR) 80 MG tablet take 1 tablet by mouth once daily AT 6 PM 90 tablet 0  . B Complex Vitamins (B COMPLEX PO) Take 1 tablet by mouth daily.    . cholecalciferol (VITAMIN D) 1000 UNITS tablet Take 1,000 Units by mouth daily.    . clopidogrel (PLAVIX) 75 MG tablet Take 1 tablet (75 mg total) by mouth daily with breakfast. 30 tablet 11  . colchicine 0.6 MG tablet take 1 tablet by mouth daily to PREVENT gout 90 tablet 0  . HYDROcodone-acetaminophen (NORCO/VICODIN) 5-325 MG tablet Take 1 tablet by mouth every 6 (six) hours as needed for moderate pain. 90 tablet 0  . levothyroxine (SYNTHROID, LEVOTHROID) 50 MCG tablet take 1 tablet by mouth once daily 90 tablet 0  . loratadine (CLARITIN) 10 MG tablet Take 10 mg by mouth daily.    . magnesium gluconate (MAGONATE) 500 MG tablet Take 500 mg by mouth 3 (three) times daily.     . metFORMIN (GLUCOPHAGE) 500 MG tablet take 1 tablet by mouth twice a day 60 tablet 3  . nystatin cream (MYCOSTATIN) Apply 1 application topically 2 (two) times daily as needed (wound care). 30 g 1  . Omega-3 Fatty Acids (FISH OIL) 1200 MG CAPS Take 1,200 mg by mouth daily.    . rivaroxaban (XARELTO) 20 MG TABS tablet Take 1 tablet (20 mg total) by mouth daily with supper. 30 tablet 4  . valsartan (DIOVAN) 320 MG tablet take 1 tablet by mouth once daily 90 tablet 0   No current facility-administered medications on file prior to visit.   Medical History:  Past Medical History  Diagnosis Date  . Hypertension   . Hyperlipidemia   . Diabetes mellitus without complication ([place])   . Thyroid disease   . Vitamin D deficiency   . Allergy   . Arthritis     lumbar  . Atrial  fibrillation, persistent ([place])     s/p DCCV 03/2015 with reversion back to atrial fibrillation  . OSA (obstructive sleep apnea)     noncompliant with CPAP  . PAF (paroxysmal atrial fibrillation) ([place])   . CAD (coronary artery disease)     cath 10/11/2015 50% left main, 40% distal LAD, 70% ostial to proximal left circumflex, 40% mid LAD and 95% mid to distal RCA treated with DES 2. Flow wire of LAD was normal with FFR of 0.87. Staged PCI of LCx   Allergies:  Allergies  Allergen Reactions  . Bee Venom Other (See Comments)    Leg went numb     Review of Systems:  Review of Systems  Constitutional: Negative.   HENT: Negative.   Eyes:  Negative.   Respiratory: Negative for cough, hemoptysis, sputum production, shortness of breath and wheezing.   Cardiovascular: Positive for leg swelling. Negative for chest pain, palpitations, orthopnea, claudication and PND.  Gastrointestinal: Negative.   Genitourinary: Negative.   Musculoskeletal: Positive for myalgias, back pain and joint pain. Negative for falls and neck pain.  Skin: Negative.   Neurological: Negative.   Endo/Heme/Allergies: Negative.   Psychiatric/Behavioral: Negative.     Family history- Review and unchanged Social history- Review and unchanged Physical Exam: BP 136/78 mmHg  Pulse 82  Temp(Src) 97.3 F (36.3 C) (Temporal)  Resp 16  Ht 6' (1.829 m)  Wt 259 lb 12.8 oz (117.845 kg)  BMI 35.23 kg/m2  SpO2 97% Wt Readings from Last 3 Encounters:  12/10/15 259 lb 12.8 oz (117.845 kg)  12/03/15 256 lb 1.9 oz (116.175 kg)  11/05/15 254 lb (115.214 kg)   General Appearance: Well nourished, in no apparent distress. Eyes: PERRLA, EOMs, conjunctiva no swelling or erythema Sinuses: No Frontal/maxillary tenderness ENT/Mouth: Ext aud canals clear, TMs without erythema, bulging. No erythema, swelling, or exudate on post pharynx.  Tonsils not swollen or erythematous. Hearing normal.  Neck: Supple, thyroid normal.  Respiratory:  Respiratory effort normal, BS equal bilaterally without rales, rhonchi, wheezing or stridor.  Cardio: Irreg irreg with no MRGs. Brisk peripheral pulses with 2+ edema.  Abdomen: Soft, + BS, obese, Non tender, no guarding, rebound, hernias, masses. Lymphatics: Non tender without lymphadenopathy.  Musculoskeletal: Full ROM, 5/5 strength, Normal gait Skin: Right ear with scaly area. Warm, dry without rashes, lesions, ecchymosis.  Neuro: Cranial nerves intact. No cerebellar symptoms.  Psych: Awake and oriented X 3, normal affect, Insight and Judgment appropriate.    Vicie Mutters, PA-C 4:43 PM [facility]

## 2015-12-11 ENCOUNTER — Other Ambulatory Visit: Payer: Medicare Other

## 2015-12-11 LAB — MAGNESIUM: MAGNESIUM: 1.7 mg/dL (ref 1.5–2.5)

## 2015-12-11 LAB — HEPATIC FUNCTION PANEL
ALK PHOS: 87 U/L (ref 40–115)
ALT: 18 U/L (ref 9–46)
AST: 17 U/L (ref 10–35)
Albumin: 4.5 g/dL (ref 3.6–5.1)
BILIRUBIN DIRECT: 0.2 mg/dL (ref <=0.2)
BILIRUBIN INDIRECT: 0.8 mg/dL (ref 0.2–1.2)
BILIRUBIN TOTAL: 1 mg/dL (ref 0.2–1.2)
Total Protein: 7.6 g/dL (ref 6.1–8.1)

## 2015-12-11 LAB — TSH: TSH: 4.59 mIU/L — ABNORMAL HIGH (ref 0.40–4.50)

## 2015-12-11 LAB — HEMOGLOBIN A1C
Hgb A1c MFr Bld: 6.6 % — ABNORMAL HIGH (ref <5.7)
Mean Plasma Glucose: 143 mg/dL

## 2015-12-11 LAB — BASIC METABOLIC PANEL WITH GFR
BUN: 24 mg/dL (ref 7–25)
CHLORIDE: 103 mmol/L (ref 98–110)
CO2: 27 mmol/L (ref 20–31)
Calcium: 9.5 mg/dL (ref 8.6–10.3)
Creat: 1.5 mg/dL — ABNORMAL HIGH (ref 0.70–1.25)
GFR, EST NON AFRICAN AMERICAN: 48 mL/min — AB (ref >=60)
GFR, Est African American: 56 mL/min — ABNORMAL LOW (ref >=60)
Glucose, Bld: 108 mg/dL — ABNORMAL HIGH (ref 65–99)
POTASSIUM: 4.1 mmol/L (ref 3.5–5.3)
SODIUM: 142 mmol/L (ref 135–146)

## 2015-12-11 LAB — URIC ACID: URIC ACID, SERUM: 5.3 mg/dL (ref 4.0–8.0)

## 2015-12-12 ENCOUNTER — Encounter: Payer: Self-pay | Admitting: Cardiology

## 2015-12-20 ENCOUNTER — Encounter: Payer: Self-pay | Admitting: Cardiology

## 2015-12-20 ENCOUNTER — Ambulatory Visit (INDEPENDENT_AMBULATORY_CARE_PROVIDER_SITE_OTHER): Payer: Medicare Other | Admitting: Cardiology

## 2015-12-20 VITALS — BP 142/90 | HR 73 | Ht 72.0 in | Wt 259.4 lb

## 2015-12-20 DIAGNOSIS — I480 Paroxysmal atrial fibrillation: Secondary | ICD-10-CM

## 2015-12-20 DIAGNOSIS — I209 Angina pectoris, unspecified: Secondary | ICD-10-CM

## 2015-12-20 MED ORDER — AMIODARONE HCL 200 MG PO TABS: 200.0000 mg | ORAL_TABLET | ORAL | 3 refills | Status: DC

## 2015-12-20 MED ORDER — AMIODARONE HCL 200 MG PO TABS: 200.0000 mg | ORAL_TABLET | ORAL | 6 refills | Status: DC

## 2015-12-20 NOTE — Patient Instructions (Signed)
Medication Instructions:  Dose change with Amiodarone: Take 400mg  twice a day for 2 week. Then decrease to 200mg  twice a day for 1 week. Then change to 300mg  once a day and continue this dose.  Labwork: Amiodarone level to be done in 3 wks (01/10/2016)  Testing/Procedures: None Ordered  Follow-Up: Reschedule Dr Radford Pax appt per letter received. If not available schedule with Lyda Jester.  Any Other Special Instructions Will Be Listed Below (If Applicable).     If you need a refill on your cardiac medications before your next appointment, please call your pharmacy.

## 2015-12-20 NOTE — Progress Notes (Signed)
12/20/2015 Christopher Wade   09-28-1950  [id number]  Primary Physician MCKEOWN,WILLIAM DAVID, MD Primary Cardiologist: Dr Radford Pax   Reason for Visit/CC: F/u for Atrial Fibrillation   HPI:  Patient is a 65 year old male, followed by [name], who presents to clinic today for follow-up regarding atrial fibrillation. He was recently placed on amiodarone along with Xarelto and scheduled for outpatient cardioversion. This was a failed attempt despite 3 shocks.   EKG today shows  Continued atrial fibrillation with a CVR. HR is 73 bpm. He is asymptomatic. Per [name], plan is to increase Amio to 400mg  BID for 2 weeks, then 200mg  BID for 1 week then 300mg  daily and repeat amio level in 3 weeks. If no chemical conversion to normal sinus rhythm, will attempt repeat direct-current cardioversion after he is reloaded with amiodarone.  Note, the patient also has a history of obstructive sleep apnea but he admits that he is not compliant with CPAP machine. We discussed the association b/t untreated sleep apnea and increased rate of afib/flutter.   Current Outpatient Prescriptions  Medication Sig Dispense Refill  . allopurinol (ZYLOPRIM) 300 MG tablet take 1 tablet by mouth once daily 30 tablet 2  . amiodarone (PACERONE) 200 MG tablet Take 1 tablet (200 mg total) by mouth 2 (two) times daily. 60 tablet 11  . amLODipine (NORVASC) 5 MG tablet Take 1 tablet (5 mg total) by mouth daily. 30 tablet 11  . atenolol (TENORMIN) 100 MG tablet take 1/2 tablet by mouth twice a day 90 tablet 3  . atorvastatin (LIPITOR) 80 MG tablet take 1 tablet by mouth once daily AT 6 PM 90 tablet 0  . B Complex Vitamins (B COMPLEX PO) Take 1 tablet by mouth daily.    . cholecalciferol (VITAMIN D) 1000 UNITS tablet Take 1,000 Units by mouth daily.    . clopidogrel (PLAVIX) 75 MG tablet Take 1 tablet (75 mg total) by mouth daily with breakfast. 30 tablet 11  . HYDROcodone-acetaminophen (NORCO/VICODIN) 5-325 MG tablet Take 1 tablet  by mouth every 6 (six) hours as needed for moderate pain. 90 tablet 0  . levothyroxine (SYNTHROID, LEVOTHROID) 50 MCG tablet take 1 tablet by mouth once daily 90 tablet 0  . loratadine (CLARITIN) 10 MG tablet Take 10 mg by mouth daily.    . magnesium gluconate (MAGONATE) 500 MG tablet Take 500 mg by mouth 3 (three) times daily.     . metFORMIN (GLUCOPHAGE) 500 MG tablet take 1 tablet by mouth twice a day 60 tablet 3  . nystatin cream (MYCOSTATIN) Apply 1 application topically 2 (two) times daily as needed (wound care). 30 g 1  . Omega-3 Fatty Acids (FISH OIL) 1200 MG CAPS Take 1,200 mg by mouth daily.    . rivaroxaban (XARELTO) 20 MG TABS tablet Take 1 tablet (20 mg total) by mouth daily with supper. 30 tablet 4  . valsartan (DIOVAN) 320 MG tablet take 1 tablet by mouth once daily 90 tablet 0  . colchicine 0.6 MG tablet take 1 tablet by mouth daily to PREVENT gout (Patient not taking: Reported on 12/20/2015) 90 tablet 0   No current facility-administered medications for this visit.     Allergies  Allergen Reactions  . Bee Venom Other (See Comments)    Leg went numb    Social History   Social History  . Marital status: Single    Spouse name: N/A  . Number of children: N/A  . Years of education: N/A   Occupational History  .  Not on file.   Social History Main Topics  . Smoking status: Never Smoker  . Smokeless tobacco: Never Used  . Alcohol use No  . Drug use: No  . Sexual activity: Not Currently   Other Topics Concern  . Not on file   Social History Narrative  . No narrative on file     Review of Systems: General: negative for chills, fever, night sweats or weight changes.  Cardiovascular: negative for chest pain, dyspnea on exertion, edema, orthopnea, palpitations, paroxysmal nocturnal dyspnea or shortness of breath Dermatological: negative for rash Respiratory: negative for cough or wheezing Urologic: negative for hematuria Abdominal: negative for nausea, vomiting,  diarrhea, bright red blood per rectum, melena, or hematemesis Neurologic: negative for visual changes, syncope, or dizziness All other systems reviewed and are otherwise negative except as noted above.    Blood pressure (!) 142/90, pulse 73, height 6' (1.829 m), weight 259 lb 6.4 oz (117.7 kg).  General appearance: alert, cooperative and no distress Neck: no carotid bruit and no JVD Lungs: clear to auscultation bilaterally Heart: irregularly irregular rhythm and reg rate Extremities: no LEE Pulses: 2+ and symmetric Skin: warm and dry Neurologic: Grossly normal  EKG Atrial Fibrillation with a CVR 74 bpm.   ASSESSMENT AND PLAN:   1. Atrial fibrillation: Recent failed direct-current cardioversion attempt. Patient remains in atrial fibrillation however heart rate is controlled and he is currently asymptomatic.  Per [name], plan is to increase Amio to 400mg  BID for 2 weeks, then 200mg  BID for 1 week then 300mg  daily and repeat amio level in 3 weeks. If no chemical conversion to normal sinus rhythm, will attempt repeat direct-current cardioversion after he is reloaded with amiodarone. Continue Xarelto and atenolol. Patient advised to improve compliance with CPAP.   PLAN  Keep f/u with [name] 01/24/2016.   Lyda Jester PA-C 12/20/2015 10:19 AM

## 2015-12-24 ENCOUNTER — Other Ambulatory Visit: Payer: Medicare Other

## 2015-12-24 DIAGNOSIS — E079 Disorder of thyroid, unspecified: Secondary | ICD-10-CM

## 2015-12-24 DIAGNOSIS — Z79899 Other long term (current) drug therapy: Secondary | ICD-10-CM | POA: Diagnosis not present

## 2015-12-24 DIAGNOSIS — M109 Gout, unspecified: Secondary | ICD-10-CM | POA: Diagnosis not present

## 2015-12-25 LAB — BASIC METABOLIC PANEL WITH GFR
BUN: 28 mg/dL — AB (ref 7–25)
CO2: 26 mmol/L (ref 20–31)
Calcium: 9.8 mg/dL (ref 8.6–10.3)
Chloride: 102 mmol/L (ref 98–110)
Creat: 1.63 mg/dL — ABNORMAL HIGH (ref 0.70–1.25)
GFR, EST AFRICAN AMERICAN: 50 mL/min — AB (ref >=60)
GFR, EST NON AFRICAN AMERICAN: 44 mL/min — AB (ref >=60)
Glucose, Bld: 97 mg/dL (ref 65–99)
POTASSIUM: 3.9 mmol/L (ref 3.5–5.3)
SODIUM: 143 mmol/L (ref 135–146)

## 2015-12-25 LAB — URIC ACID: URIC ACID, SERUM: 6.6 mg/dL (ref 4.0–8.0)

## 2015-12-25 LAB — TSH: TSH: 8.11 m[IU]/L — AB (ref 0.40–4.50)

## 2015-12-30 ENCOUNTER — Other Ambulatory Visit: Payer: Self-pay | Admitting: Internal Medicine

## 2016-01-01 ENCOUNTER — Ambulatory Visit (INDEPENDENT_AMBULATORY_CARE_PROVIDER_SITE_OTHER): Payer: Medicare Other | Admitting: Physician Assistant

## 2016-01-01 ENCOUNTER — Encounter: Payer: Self-pay | Admitting: Physician Assistant

## 2016-01-01 VITALS — BP 140/100 | HR 82 | Temp 98.1°F | Resp 14 | Ht 72.0 in | Wt 255.0 lb

## 2016-01-01 DIAGNOSIS — M25561 Pain in right knee: Secondary | ICD-10-CM | POA: Diagnosis not present

## 2016-01-01 DIAGNOSIS — I10 Essential (primary) hypertension: Secondary | ICD-10-CM | POA: Diagnosis not present

## 2016-01-01 DIAGNOSIS — E039 Hypothyroidism, unspecified: Secondary | ICD-10-CM

## 2016-01-01 DIAGNOSIS — R350 Frequency of micturition: Secondary | ICD-10-CM | POA: Diagnosis not present

## 2016-01-01 DIAGNOSIS — I480 Paroxysmal atrial fibrillation: Secondary | ICD-10-CM | POA: Diagnosis not present

## 2016-01-01 DIAGNOSIS — N179 Acute kidney failure, unspecified: Secondary | ICD-10-CM | POA: Diagnosis not present

## 2016-01-01 DIAGNOSIS — M109 Gout, unspecified: Secondary | ICD-10-CM

## 2016-01-01 NOTE — Patient Instructions (Signed)
Acute Kidney Injury °Acute kidney injury is any condition in which there is sudden (acute) damage to the kidneys. Acute kidney injury was previously known as acute kidney failure or acute renal failure. The kidneys are two organs that lie on either side of the spine between the middle of the back and the front of the abdomen. The kidneys: °· Remove wastes and extra water from the blood.   °· Produce important hormones. These help keep bones strong, regulate blood pressure, and help create red blood cells.   °· Balance the fluids and chemicals in the blood and tissues. °A small amount of kidney damage may not cause problems, but a large amount of damage may make it difficult or impossible for the kidneys to work the way they should. Acute kidney injury may develop into long-lasting (chronic) kidney disease. It may also develop into a life-threatening disease called end-stage kidney disease. Acute kidney injury can get worse very quickly, so it should be treated right away. Early treatment may prevent other kidney diseases from developing. °CAUSES  °· A problem with blood flow to the kidneys. This may be caused by:   °¨ Blood loss.   °¨ Heart disease.   °¨ Severe burns.   °¨ Liver disease. °· Direct damage to the kidneys. This may be caused by: °¨ Some medicines.   °¨ A kidney infection.   °¨ Poisoning or consuming toxic substances.   °¨ A surgical wound.   °¨ A blow to the kidney area.   °· A problem with urine flow. This may be caused by:   °¨ Cancer.   °¨ Kidney stones.   °¨ An enlarged prostate. °SIGNS AND SYMPTOMS  °· Swelling (edema) of the legs, ankles, or feet.   °· Tiredness (lethargy).   °· Nausea or vomiting.   °· Confusion.   °· Problems with urination, such as:   °¨ Painful or burning feeling during urination.   °¨ Decreased urine production.   °¨ Frequent accidents in children who are potty trained.   °¨ Bloody urine.   °· Muscle twitches and cramps.   °· Shortness of breath.   °· Seizures.   °· Chest  pain or pressure. °Sometimes, no symptoms are present.  °DIAGNOSIS °Acute kidney injury may be detected and diagnosed by tests, including blood, urine, imaging, or kidney biopsy tests.  °TREATMENT °Treatment of acute kidney injury varies depending on the cause and severity of the kidney damage. In mild cases, no treatment may be needed. The kidneys may heal on their own. If acute kidney injury is more severe, your health care provider will treat the cause of the kidney damage, help the kidneys heal, and prevent complications from occurring. Severe cases may require a procedure to remove toxic wastes from the body (dialysis) or surgery to repair kidney damage. Surgery may involve:  °· Repair of a torn kidney.   °· Removal of an obstruction. °HOME CARE INSTRUCTIONS °· Follow your prescribed diet. °· Take medicines only as directed by your health care provider.  °· Do not take any new medicines (prescription, over-the-counter, or nutritional supplements) unless approved by your health care provider. Many medicines can worsen your kidney damage or may need to have the dose adjusted.   °· Keep all follow-up visits as directed by your health care provider. This is important. °· Observe your condition to make sure you are healing as expected. °SEEK IMMEDIATE MEDICAL CARE IF: °· You are feeling ill or have severe pain in the back or side.   °· Your symptoms return or you have new symptoms. °· You have any symptoms of end-stage kidney disease. These include:   °¨ Persistent itchiness.   °¨   Loss of appetite.   Headaches.   Abnormally dark or light skin.  Numbness in the hands or feet.   Easy bruising.   Frequent hiccups.   Menstruation stops.   You have a fever.  You have increased urine production.  You have pain or bleeding when urinating. MAKE SURE YOU:   Understand these instructions.  Will watch your condition.  Will get help right away if you are not doing well or get worse.   This  information is not intended to replace advice given to you by your health care provider. Make sure you discuss any questions you have with your health care provider.   Document Released: 11/24/2010 Document Revised: 06/01/2014 Document Reviewed: 01/08/2012 Elsevier Interactive Patient Education 2016 [place] Cyst A Baker cyst is a sac-like structure that forms in the back of the knee. It is filled with the same fluid that is located in your knee. This fluid lubricates the bones and cartilage of the knee and allows them to move over each other more easily. CAUSES  When the knee becomes injured or inflamed, increased fluid forms in the knee. When this happens, the joint lining is pushed out behind the knee and forms the Baker cyst. This cyst may also be caused by inflammation from arthritic conditions and infections. SIGNS AND SYMPTOMS  A Baker cyst usually has no symptoms. When the cyst is substantially enlarged:  You may feel pressure behind the knee, stiffness in the knee, or a mass in the area behind the knee.  You may develop pain, redness, and swelling in the calf. This can suggest a blood clot and requires evaluation by your health care provider. DIAGNOSIS  A Baker cyst is most often found during an ultrasound exam. This exam may have been performed for other reasons, and the cyst was found incidentally. Sometimes an MRI is used. This picks up other problems within a joint that an ultrasound exam may not. If the Baker cyst developed immediately after an injury, X-ray exams may be used to diagnose the cyst. TREATMENT  The treatment depends on the cause of the cyst. Anti-inflammatory medicines and rest often will be prescribed. If the cyst is caused by a bacterial infection, antibiotic medicines may be prescribed.  HOME CARE INSTRUCTIONS   If the cyst was caused by an injury, for the first 24 hours, keep the injured leg elevated on 2 pillows while lying down.  For the first  24 hours while you are awake, apply ice to the injured area:  Put ice in a plastic bag.  Place a towel between your skin and the bag.  Leave the ice on for 20 minutes, 2-3 times a day.  Only take over-the-counter or prescription medicines for pain, discomfort, or fever as directed by your health care provider.  Only take antibiotic medicine as directed. Make sure to finish it even if you start to feel better. MAKE SURE YOU:   Understand these instructions.  Will watch your condition.  Will get help right away if you are not doing well or get worse.   This information is not intended to replace advice given to you by your health care provider. Make sure you discuss any questions you have with your health care provider.   Document Released: 05/11/2005 Document Revised: 03/01/2013 Document Reviewed: 12/21/2012 Elsevier Interactive Patient Education [employer].

## 2016-01-01 NOTE — Progress Notes (Signed)
Assessment and Plan: 1. PAF (paroxysmal atrial fibrillation) ([place]) Continue follow up cardio  2. Morbid obesity, unspecified obesity type ([place]) Obesity with co morbidities- long discussion about weight loss, diet, and exercise  3. Essential hypertension Continue to monitor at home, recheck in the office better, could be from pain, monitor at home, call if above 140/90, DASH diet  4. AKI (acute kidney injury) ([place]) - BASIC METABOLIC PANEL WITH GFR - Magnesium - Microalbumin / creatinine urine ratio  5. Hypothyroidism, unspecified hypothyroidism type Continue same on thyroid  6. Urinary frequency Rule out infection - Urinalysis, Routine w reflex microscopic (not at [facility]) - Microalbumin / creatinine urine ratio - Urine culture  7. Gout without tophus, unspecified cause, unspecified chronicity, unspecified site - Uric acid  8. Right knee pain Rule out gout with lowering of allopurinol due to AKI, stop NSAIDS, may need  - Ambulatory referral to Orthopedic Surgery  Future Appointments Date Time Provider [place]  01/10/2016 7:40 AM [facility]  01/13/2016 11:30 AM Brand Males, MD [facility] None  02/12/2016 10:45 AM [name], MD [facility]  05/21/2016 3:00 PM Vicie Mutters, PA-C [facility] None     HPI 65 y.o.male presents for follow up for kidney function and thyroid.  He states he has been increasing his water, he is 1/2 of the allopurinol. He is not on a fluid pill.  Lab Results  Component Value Date   CREATININE 1.63 (H) 12/24/2015   BUN 28 (H) 12/24/2015   NA 143 12/24/2015   K 3.9 12/24/2015   CL 102 12/24/2015   CO2 26 12/24/2015   He is taking his thyroid in the morning away from all of his meds now x 1 week.  Lab Results  Component Value Date   TSH 8.11 (H) 12/24/2015   He also states that he has some right knee pain x4 days, does not recall injury but states Friday evening he was crawling  around on the floor for work Started Saturday, worse Sunday, ain is back on his knee and front of his knee. Worse with bearing weight. Took an advil on Tuesday and today which did help but he is trying not to take them due to kidney function.   Past Medical History:  Diagnosis Date  . Allergy   . Arthritis    lumbar  . Atrial fibrillation, persistent ([place])    s/p DCCV 03/2015 with reversion back to atrial fibrillation  . CAD (coronary artery disease)    cath 10/11/2015 50% left main, 40% distal LAD, 70% ostial to proximal left circumflex, 40% mid LAD and 95% mid to distal RCA treated with DES 2. Flow wire of LAD was normal with FFR of 0.87. Staged PCI of LCx  . Diabetes mellitus without complication ([place])   . Hyperlipidemia   . Hypertension   . OSA (obstructive sleep apnea)    noncompliant with CPAP  . PAF (paroxysmal atrial fibrillation) ([place])   . Thyroid disease   . Vitamin D deficiency      Allergies  Allergen Reactions  . Bee Venom Other (See Comments)    Leg went numb      Current Outpatient Prescriptions on File Prior to Visit  Medication Sig Dispense Refill  . allopurinol (ZYLOPRIM) 300 MG tablet take 1 tablet by mouth once daily 30 tablet 2  . amiodarone (PACERONE) 200 MG tablet Take 1 tablet (200 mg total) by mouth as directed. Take 400mg  by mouth twice a day for 2 weeks.  Then decrease to 200mg  twice daily for 1 week. Then decrease to 300mg  once a day. 90 tablet 6  . amLODipine (NORVASC) 5 MG tablet Take 1 tablet (5 mg total) by mouth daily. 30 tablet 11  . atenolol (TENORMIN) 100 MG tablet take 1/2 tablet by mouth twice a day 90 tablet 3  . atorvastatin (LIPITOR) 80 MG tablet take 1 tablet by mouth once daily AT 6 PM 90 tablet 0  . B Complex Vitamins (B COMPLEX PO) Take 1 tablet by mouth daily.    . cholecalciferol (VITAMIN D) 1000 UNITS tablet Take 1,000 Units by mouth daily.    . clopidogrel (PLAVIX) 75 MG tablet Take 1 tablet (75 mg total) by mouth daily with  breakfast. 30 tablet 11  . colchicine 0.6 MG tablet take 1 tablet by mouth daily to PREVENT gout 90 tablet 0  . HYDROcodone-acetaminophen (NORCO/VICODIN) 5-325 MG tablet Take 1 tablet by mouth every 6 (six) hours as needed for moderate pain. 90 tablet 0  . levothyroxine (SYNTHROID, LEVOTHROID) 50 MCG tablet take 1 tablet by mouth once daily 90 tablet 0  . loratadine (CLARITIN) 10 MG tablet Take 10 mg by mouth daily.    . magnesium gluconate (MAGONATE) 500 MG tablet Take 500 mg by mouth 3 (three) times daily.     . metFORMIN (GLUCOPHAGE) 500 MG tablet take 1 tablet by mouth twice a day 60 tablet 3  . nystatin cream (MYCOSTATIN) Apply 1 application topically 2 (two) times daily as needed (wound care). 30 g 1  . Omega-3 Fatty Acids (FISH OIL) 1200 MG CAPS Take 1,200 mg by mouth daily.    . rivaroxaban (XARELTO) 20 MG TABS tablet Take 1 tablet (20 mg total) by mouth daily with supper. 30 tablet 4  . valsartan (DIOVAN) 320 MG tablet take 1 tablet by mouth once daily 90 tablet 0   No current facility-administered medications on file prior to visit.     ROS: all negative except above.   Physical Exam: Filed Weights   01/01/16 1450  Weight: 255 lb (115.7 kg)   BP (!) 140/100   Pulse 82   Temp 98.1 F (36.7 C)   Resp 14   Ht 6' (1.829 m)   Wt 255 lb (115.7 kg)   SpO2 95%   BMI 34.58 kg/m  General Appearance: Well nourished, in no apparent distress. Eyes: PERRLA, EOMs, conjunctiva no swelling or erythema Sinuses: No Frontal/maxillary tenderness ENT/Mouth: Ext aud canals clear, TMs without erythema, bulging. No erythema, swelling, or exudate on post pharynx.  Tonsils not swollen or erythematous. Hearing normal.  Neck: Supple, thyroid normal.  Respiratory: Respiratory effort normal, BS equal bilaterally without rales, rhonchi, wheezing or stridor.  Cardio: Irreg irreg with no MRGs. Brisk peripheral pulses with 2+ edema.  Abdomen: Soft, + BS, obese, Non tender, no guarding, rebound,  hernias, masses. Lymphatics: Non tender without lymphadenopathy.  Musculoskeletal: Full ROM, 5/5 strength, Gait antalgic, right knee with pain to palpation medial joint line, + tenderness at posterior knee.  Skin: Right ear with scaly area. Warm, dry without rashes, lesions, ecchymosis.  Neuro: Cranial nerves intact. No cerebellar symptoms.  Psych: Awake and oriented X 3, normal affect, Insight and Judgment appropriate.     Vicie Mutters, PA-C 3:06 PM [facility]

## 2016-01-02 ENCOUNTER — Other Ambulatory Visit: Payer: Self-pay | Admitting: Physician Assistant

## 2016-01-02 DIAGNOSIS — E039 Hypothyroidism, unspecified: Secondary | ICD-10-CM

## 2016-01-02 DIAGNOSIS — Z79899 Other long term (current) drug therapy: Secondary | ICD-10-CM

## 2016-01-02 LAB — URINALYSIS, ROUTINE W REFLEX MICROSCOPIC
Bilirubin Urine: NEGATIVE
Glucose, UA: NEGATIVE
HGB URINE DIPSTICK: NEGATIVE
Ketones, ur: NEGATIVE
LEUKOCYTES UA: NEGATIVE
NITRITE: NEGATIVE
PH: 6 (ref 5.0–8.0)
Protein, ur: NEGATIVE
Specific Gravity, Urine: 1.024 (ref 1.001–1.035)

## 2016-01-02 LAB — BASIC METABOLIC PANEL WITH GFR
BUN: 30 mg/dL — AB (ref 7–25)
CHLORIDE: 99 mmol/L (ref 98–110)
CO2: 24 mmol/L (ref 20–31)
Calcium: 9.1 mg/dL (ref 8.6–10.3)
Creat: 1.35 mg/dL — ABNORMAL HIGH (ref 0.70–1.25)
GFR, Est African American: 63 mL/min (ref >=60)
GFR, Est Non African American: 55 mL/min — ABNORMAL LOW (ref >=60)
GLUCOSE: 153 mg/dL — AB (ref 65–99)
POTASSIUM: 4 mmol/L (ref 3.5–5.3)
Sodium: 141 mmol/L (ref 135–146)

## 2016-01-02 LAB — MICROALBUMIN / CREATININE URINE RATIO
CREATININE, URINE: 280 mg/dL (ref 20–370)
MICROALB UR: 1.7 mg/dL
MICROALB/CREAT RATIO: 6 ug/mg{creat} (ref <30)

## 2016-01-02 LAB — MAGNESIUM: MAGNESIUM: 1.4 mg/dL — AB (ref 1.5–2.5)

## 2016-01-02 LAB — URIC ACID: Uric Acid, Serum: 6.2 mg/dL (ref 4.0–8.0)

## 2016-01-02 LAB — URINE CULTURE: Organism ID, Bacteria: NO GROWTH

## 2016-01-10 ENCOUNTER — Other Ambulatory Visit: Payer: Medicare Other | Admitting: *Deleted

## 2016-01-10 DIAGNOSIS — I480 Paroxysmal atrial fibrillation: Secondary | ICD-10-CM | POA: Diagnosis not present

## 2016-01-13 ENCOUNTER — Ambulatory Visit (INDEPENDENT_AMBULATORY_CARE_PROVIDER_SITE_OTHER): Payer: Medicare Other | Admitting: Internal Medicine

## 2016-01-13 ENCOUNTER — Encounter: Payer: Self-pay | Admitting: Internal Medicine

## 2016-01-13 VITALS — BP 146/80 | HR 78 | Ht 72.0 in | Wt 256.0 lb

## 2016-01-13 DIAGNOSIS — R0609 Other forms of dyspnea: Secondary | ICD-10-CM | POA: Insufficient documentation

## 2016-01-13 DIAGNOSIS — R942 Abnormal results of pulmonary function studies: Secondary | ICD-10-CM | POA: Diagnosis not present

## 2016-01-13 DIAGNOSIS — R0689 Other abnormalities of breathing: Secondary | ICD-10-CM | POA: Diagnosis not present

## 2016-01-13 DIAGNOSIS — I209 Angina pectoris, unspecified: Secondary | ICD-10-CM

## 2016-01-13 DIAGNOSIS — R0600 Dyspnea, unspecified: Secondary | ICD-10-CM | POA: Diagnosis not present

## 2016-01-13 DIAGNOSIS — Z79899 Other long term (current) drug therapy: Secondary | ICD-10-CM

## 2016-01-13 NOTE — Patient Instructions (Addendum)
ICD-9-CM ICD-10-CM   1. Dyspnea and respiratory abnormality 786.09 R06.00     R06.89   2. Abnormal PFT 794.2 R94.2   3. On amiodarone therapy V58.69 Z79.899     Do hrct chest at your convenience Will call with results to decide next step

## 2016-01-13 NOTE — Progress Notes (Signed)
Subjective:    Patient ID: Christopher Wade, male    DOB: November 28, 1950, 66 y.o.   MRN: [id number]  HPI   IOV 01/13/2016  Chief Complaint  Patient presents with  . Pulmonary Consult    Pt referred by Dr. Fransico Him for abnormal PFT. Pt states after heavy exertion he becomes SOB. Pt denies cough, CP/tightness, f/c/s.    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 History is obtained from him and review of the chart. He says approximately 6 months ago started noticing dyspnea on exertion of moderate intensity relieved by rest without associated chest pain or wheezing or cough. He was diagnosed to have atrial fibrillation. Apparently has failed cardioversion. According to chart review he at first cardioversion in November 2016 with success. Then it appears in the spring 2017) cardiac stress test which was abnormal and then he had right coronary artery stenting 2 and also PTCA of the left circumflex. Restarted on Xarelto 10/16/2015 and was on triple therapy with aspirin and Plavix and Xarelto. He stopped aspirin 11/14/2015. He said that this interventions his dyspnea did improve. It looks like is able to relation came back and he underwent second cardioversion July 2017. According to his history is been loaded on amiodarone since then with the possibility of third cardioversion being planned. He did not Pulmicort function test in June 2017 and according to his history was before the amiodarone he did this was abnormal and therefore he is been referred here. Currently overall dyspnea is improved but he still has it for heavy exertion and this is not normal for him   11/11/2015 pulmonary function test: FVC 2.4 L/71% ratio of 99. Postbronchodilator FVC 2.57 L/74%. Total lung capacity 5.42 L/75% fits in with restriction. DLCO 18.34/54% with a normal hemoglobin in July 2017 of 15.2 g percent personally visualized the PFT  Walking desaturation test 185 feet 3 laps and rheumatic: Did not  desaturate below 90%.    has a past medical history of Allergy; Arthritis; Atrial fibrillation, persistent ([place]); CAD (coronary artery disease); Diabetes mellitus without complication ([place]); Hyperlipidemia; Hypertension; OSA (obstructive sleep apnea); PAF (paroxysmal atrial fibrillation) ([place]); Thyroid disease; and Vitamin D deficiency.   reports that he has never smoked. He has never used smokeless tobacco.  Past Surgical History:  Procedure Laterality Date  . CARDIAC CATHETERIZATION N/A 10/11/2015   Procedure: Left Heart Cath and Coronary Angiography;  Surgeon: [name], MD;  Location: [place] [facility];  Service: Cardiovascular;  Laterality: N/A;  . CARDIAC CATHETERIZATION N/A 10/11/2015   Procedure: Coronary Stent Intervention;  Surgeon: [name], MD;  Location: [place] [facility];  Service: Cardiovascular;  Laterality: N/A;  . CARDIAC CATHETERIZATION N/A 10/11/2015   Procedure: Intravascular Pressure Wire/FFR Study;  Surgeon: [name], MD;  Location: [place] [facility];  Service: Cardiovascular;  Laterality: N/A;  . CARDIAC CATHETERIZATION N/A 10/14/2015   Procedure: Coronary Stent Intervention;  Surgeon: [name], MD;  Location: [place] [facility];  Service: Cardiovascular;  Laterality: N/A;  . CARDIOVERSION N/A 04/05/2015   Procedure: CARDIOVERSION;  Surgeon: [name], MD;  Location: [place];  Service: Cardiovascular;  Laterality: N/A;  . CARDIOVERSION N/A 12/09/2015   Procedure: CARDIOVERSION;  Surgeon: [name], MD;  Location: [facility];  Service: Cardiovascular;  Laterality: N/A;  . JOINT REPLACEMENT Bilateral 2001   Hips    Allergies  Allergen Reactions  . Bee Venom Other (See Comments)    Leg went numb  Immunization History  Administered Date(s) Administered  . Influenza Split 04/05/2014  . Influenza-Unspecified 02/02/2013  . Pneumococcal Conjugate-13 05/01/2014  . Pneumococcal Polysaccharide-23 05/16/2015  . Td  04/25/2010  . Varicella Zoster Immune Globulin 02/22/2014    Family History  Problem Relation Age of Onset  . Cancer Mother 71    Lung  . Cancer Father 63    lung  . Cancer Sister 74    colon     Current Outpatient Prescriptions:  .  allopurinol (ZYLOPRIM) 300 MG tablet, take 1 tablet by mouth once daily, Disp: 30 tablet, Rfl: 2 .  amiodarone (PACERONE) 200 MG tablet, Take 1 tablet (200 mg total) by mouth as directed. Take 400mg  by mouth twice a day for 2 weeks. Then decrease to 200mg  twice daily for 1 week. Then decrease to 300mg  once a day., Disp: 90 tablet, Rfl: 6 .  amLODipine (NORVASC) 5 MG tablet, Take 1 tablet (5 mg total) by mouth daily., Disp: 30 tablet, Rfl: 11 .  atenolol (TENORMIN) 100 MG tablet, take 1/2 tablet by mouth twice a day, Disp: 90 tablet, Rfl: 3 .  atorvastatin (LIPITOR) 80 MG tablet, take 1 tablet by mouth once daily AT 6 PM, Disp: 90 tablet, Rfl: 0 .  B Complex Vitamins (B COMPLEX PO), Take 1 tablet by mouth daily., Disp: , Rfl:  .  cholecalciferol (VITAMIN D) 1000 UNITS tablet, Take 1,000 Units by mouth daily., Disp: , Rfl:  .  clopidogrel (PLAVIX) 75 MG tablet, Take 1 tablet (75 mg total) by mouth daily with breakfast., Disp: 30 tablet, Rfl: 11 .  HYDROcodone-acetaminophen (NORCO/VICODIN) 5-325 MG tablet, Take 1 tablet by mouth every 6 (six) hours as needed for moderate pain., Disp: 90 tablet, Rfl: 0 .  levothyroxine (SYNTHROID, LEVOTHROID) 50 MCG tablet, take 1 tablet by mouth once daily, Disp: 90 tablet, Rfl: 0 .  loratadine (CLARITIN) 10 MG tablet, Take 10 mg by mouth daily., Disp: , Rfl:  .  magnesium gluconate (MAGONATE) 500 MG tablet, Take 500 mg by mouth 2 (two) times daily. , Disp: , Rfl:  .  metFORMIN (GLUCOPHAGE) 500 MG tablet, take 1 tablet by mouth twice a day, Disp: 60 tablet, Rfl: 3 .  nystatin cream (MYCOSTATIN), Apply 1 application topically 2 (two) times daily as needed (wound care)., Disp: 30 g, Rfl: 1 .  Omega-3 Fatty Acids (FISH OIL) 1200  MG CAPS, Take 1,200 mg by mouth daily., Disp: , Rfl:  .  rivaroxaban (XARELTO) 20 MG TABS tablet, Take 1 tablet (20 mg total) by mouth daily with supper., Disp: 30 tablet, Rfl: 4 .  valsartan (DIOVAN) 320 MG tablet, take 1 tablet by mouth once daily, Disp: 90 tablet, Rfl: 0     Review of Systems  Constitutional: Negative for fever and unexpected weight change.  HENT: Negative for congestion, dental problem, ear pain, nosebleeds, postnasal drip, rhinorrhea, sinus pressure, sneezing, sore throat and trouble swallowing.   Eyes: Negative for redness and itching.  Respiratory: Negative for cough, chest tightness, shortness of breath and wheezing.   Cardiovascular: Negative for palpitations and leg swelling.  Gastrointestinal: Negative for nausea and vomiting.  Genitourinary: Negative for dysuria.  Musculoskeletal: Negative for joint swelling.  Skin: Negative for rash.  Neurological: Negative for headaches.  Hematological: Does not bruise/bleed easily.  Psychiatric/Behavioral: Negative for dysphoric mood. The patient is not nervous/anxious.        Objective:   Physical Exam  Constitutional: He is oriented to person, place, and time. He appears well-developed and  well-nourished. No distress.  HENT:  Head: Normocephalic and atraumatic.  Right Ear: External ear normal.  Left Ear: External ear normal.  Mouth/Throat: Oropharynx is clear and moist. No oropharyngeal exudate.  Eyes: Conjunctivae and EOM are normal. Pupils are equal, round, and reactive to light. Right eye exhibits no discharge. Left eye exhibits no discharge. No scleral icterus.  Neck: Normal range of motion. Neck supple. No JVD present. No tracheal deviation present. No thyromegaly present.  Cardiovascular: Normal rate, regular rhythm and intact distal pulses.  Exam reveals no gallop and no friction rub.   No murmur heard. Pulmonary/Chest: Effort normal and breath sounds normal. No respiratory distress. He has no wheezes. He  has no rales. He exhibits no tenderness.  Abdominal: Soft. Bowel sounds are normal. He exhibits no distension and no mass. There is no tenderness. There is no rebound and no guarding.  Significant visceral obesity present  Musculoskeletal: Normal range of motion. He exhibits no edema or tenderness.  Lymphadenopathy:    He has no cervical adenopathy.  Neurological: He is alert and oriented to person, place, and time. He has normal reflexes. No cranial nerve deficit. Coordination normal.  Skin: Skin is warm and dry. No rash noted. He is not diaphoretic. No erythema. No pallor.  Psychiatric: He has a normal mood and affect. His behavior is normal. Judgment and thought content normal.  Nursing note and vitals reviewed.   Vitals:   01/13/16 1207  BP: (!) 146/80  Pulse: 78  SpO2: 97%  Weight: 256 lb (116.1 kg)  Height: 6' (1.829 m)   Estimated body mass index is 34.72 kg/m as calculated from the following:   Height as of this encounter: 6' (1.829 m).   Weight as of this encounter: 256 lb (116.1 kg).         Assessment & Plan:     ICD-9-CM ICD-10-CM   1. Dyspnea and respiratory abnormality 786.09 R06.00 CT Chest High Resolution    R06.89   2. Abnormal PFT 794.2 R94.2 CT Chest High Resolution  3. On amiodarone therapy V58.69 Z79.899 CT Chest High Resolution    Pulmonary function test shows restriction with low DLCO. Interstitial lung disease needs to be ruled out. Therefore we will get a high-resolution CT chest with prone and supine positioning. I think a lot of his dyspnea is related to cardiac issues of atrial fibrillation and coronary artery disease but that is still residual dyspnea and given the abnormal PFT ILD needs to be ruled out. He is CT chest is normal then we would attribute the abnormal PFT to his obesity. However if that his interstitial lung disease then we need to sort out if it is due to Amiodarone therapy or not  He is in agreement with the plan    Dr. Brand Males, M.D., [facility].C.P Pulmonary and Critical Care Medicine Staff Physician [place] [facility] Pager: [phone], If no answer or between  15:00h - 7:00h: call [phone]  01/13/2016 12:44 PM

## 2016-01-15 LAB — AMIODARONE LEVEL
AMIODARONE LVL: 1 ug/mL — AB (ref 1.5–2.5)
Desethylamiodarone: 0.6 ug/mL — ABNORMAL LOW (ref 1.5–2.5)

## 2016-01-23 ENCOUNTER — Ambulatory Visit (INDEPENDENT_AMBULATORY_CARE_PROVIDER_SITE_OTHER)
Admission: RE | Admit: 2016-01-23 | Discharge: 2016-01-23 | Disposition: A | Discharge status: Home / Self Care | Payer: Medicare Other | Source: Ambulatory Visit | Attending: Internal Medicine | Admitting: Internal Medicine

## 2016-01-23 DIAGNOSIS — Z79899 Other long term (current) drug therapy: Secondary | ICD-10-CM | POA: Diagnosis not present

## 2016-01-23 DIAGNOSIS — R0689 Other abnormalities of breathing: Secondary | ICD-10-CM | POA: Diagnosis not present

## 2016-01-23 DIAGNOSIS — R942 Abnormal results of pulmonary function studies: Secondary | ICD-10-CM

## 2016-01-23 DIAGNOSIS — R0600 Dyspnea, unspecified: Secondary | ICD-10-CM

## 2016-01-23 DIAGNOSIS — R0602 Shortness of breath: Secondary | ICD-10-CM | POA: Diagnosis not present

## 2016-01-24 ENCOUNTER — Telehealth: Payer: Self-pay | Admitting: Internal Medicine

## 2016-01-24 ENCOUNTER — Ambulatory Visit: Payer: Medicare Other | Admitting: Cardiology

## 2016-01-24 DIAGNOSIS — R0600 Dyspnea, unspecified: Secondary | ICD-10-CM

## 2016-01-24 NOTE — Telephone Encounter (Signed)
Ct chest shows there maybe ILD v maybe not; we are not sure  Plan  - ok to continue amio  refer pulm rehab for dyspnea - ROV 6 months with HRCT   Thanks  Dr. Brand Males, M.D., [facility].C.P Pulmonary and Critical Care Medicine Staff Physician [place] [facility] Pager: [phone], If no answer or between  15:00h - 7:00h: call [phone]  01/24/2016 3:20 PM      Ct Chest High Resolution  Result Date: 01/23/2016 CLINICAL DATA:  65 year old male with chronic shortness of breath, worse on exertion. EXAM: CT CHEST WITHOUT CONTRAST TECHNIQUE: Multidetector CT imaging of the chest was performed following the standard protocol without intravenous contrast. High resolution imaging of the lungs, as well as inspiratory and expiratory imaging, was performed. COMPARISON:  None. FINDINGS: Cardiovascular: Heart size is mildly enlarged. There is no significant pericardial fluid, thickening or pericardial calcification. There is aortic atherosclerosis, as well as atherosclerosis of the great vessels of the mediastinum and the coronary arteries, including calcified atherosclerotic plaque in the left main, left anterior descending, left circumflex and right coronary arteries. Mediastinum/Nodes: No pathologically enlarged mediastinal or hilar lymph nodes. Please note that accurate exclusion of hilar adenopathy is limited on noncontrast CT scans. Esophagus is unremarkable in appearance. No axillary lymphadenopathy. Lungs/Pleura: Predominantly in the extreme lung bases there are areas of mild septal thickening. No parenchymal banding, significant areas of traction bronchiectasis or frank honeycombing are noted. Inspiratory and expiratory imaging demonstrates patchy areas of air trapping, indicative of mild small airways disease. No acute consolidative airspace disease. No pleural effusions. No suspicious appearing pulmonary nodules or masses. Upper Abdomen: Aortic  atherosclerosis. Musculoskeletal: There are no aggressive appearing lytic or blastic lesions noted in the visualized portions of the skeleton. IMPRESSION: 1. There are subtle findings in the lung bases which could indicate very early changes related interstitial lung disease. At this time, these findings are highly nonspecific, and repeat high-resolution chest CT is recommended in 6-12 months to evaluate for temporal changes in the appearance of the lung parenchyma. 2. Aortic atherosclerosis, in addition to left main and 3 vessel coronary artery disease. Please note that although the presence of coronary artery calcium documents the presence of coronary artery disease, the severity of this disease and any potential stenosis cannot be assessed on this non-gated CT examination. Assessment for potential risk factor modification, dietary therapy or pharmacologic therapy may be warranted, if clinically indicated. 3. Mild cardiomegaly. Electronically Signed   By: Vinnie Langton M.D.   On: 01/23/2016 16:26

## 2016-01-31 NOTE — Telephone Encounter (Signed)
Called and spoke to pt. Informed him of the results and recs per MR. Orders and 6 month recall placed. Pt verbalized understanding and denied any further questions or concerns at this time.

## 2016-02-04 ENCOUNTER — Other Ambulatory Visit: Payer: Medicare Other

## 2016-02-04 ENCOUNTER — Other Ambulatory Visit: Payer: Self-pay

## 2016-02-04 DIAGNOSIS — E039 Hypothyroidism, unspecified: Secondary | ICD-10-CM

## 2016-02-04 DIAGNOSIS — Z79899 Other long term (current) drug therapy: Secondary | ICD-10-CM

## 2016-02-04 DIAGNOSIS — N179 Acute kidney failure, unspecified: Secondary | ICD-10-CM

## 2016-02-05 ENCOUNTER — Other Ambulatory Visit: Payer: Self-pay | Admitting: Physician Assistant

## 2016-02-05 DIAGNOSIS — Z79899 Other long term (current) drug therapy: Secondary | ICD-10-CM | POA: Diagnosis not present

## 2016-02-05 DIAGNOSIS — E039 Hypothyroidism, unspecified: Secondary | ICD-10-CM | POA: Diagnosis not present

## 2016-02-05 DIAGNOSIS — N179 Acute kidney failure, unspecified: Secondary | ICD-10-CM

## 2016-02-05 LAB — BASIC METABOLIC PANEL WITH GFR
BUN: 22 mg/dL (ref 7–25)
CHLORIDE: 103 mmol/L (ref 98–110)
CO2: 29 mmol/L (ref 20–31)
CREATININE: 1.3 mg/dL — AB (ref 0.70–1.25)
Calcium: 9.4 mg/dL (ref 8.6–10.3)
GFR, EST AFRICAN AMERICAN: 66 mL/min (ref >=60)
GFR, Est Non African American: 57 mL/min — ABNORMAL LOW (ref >=60)
GLUCOSE: 126 mg/dL — AB (ref 65–99)
Potassium: 4 mmol/L (ref 3.5–5.3)
Sodium: 142 mmol/L (ref 135–146)

## 2016-02-05 LAB — MAGNESIUM: Magnesium: 1.7 mg/dL (ref 1.5–2.5)

## 2016-02-05 LAB — TSH: TSH: 6.67 m[IU]/L — AB (ref 0.40–4.50)

## 2016-02-06 ENCOUNTER — Other Ambulatory Visit: Payer: Self-pay | Admitting: Physician Assistant

## 2016-02-06 DIAGNOSIS — N182 Chronic kidney disease, stage 2 (mild): Principal | ICD-10-CM

## 2016-02-06 DIAGNOSIS — E1122 Type 2 diabetes mellitus with diabetic chronic kidney disease: Secondary | ICD-10-CM

## 2016-02-06 DIAGNOSIS — N183 Chronic kidney disease, stage 3 unspecified: Secondary | ICD-10-CM

## 2016-02-06 NOTE — Progress Notes (Signed)
Patient's kidney function have been decreased since cath/stents in May and have not recovered, he is obese, diabetic, he is off lasix, allopurinol is cut in half and has increased water without any change in his kidney function.  Have gone from GFR of 80 to GFR of 50-60. Discussed with [name] and we will refer him to nephrology for evaluation.

## 2016-02-06 NOTE — Progress Notes (Signed)
Will refer to nephrology, see lab note

## 2016-02-12 ENCOUNTER — Encounter: Payer: Self-pay | Admitting: Cardiology

## 2016-02-12 ENCOUNTER — Ambulatory Visit (INDEPENDENT_AMBULATORY_CARE_PROVIDER_SITE_OTHER): Payer: Medicare Other | Admitting: Cardiology

## 2016-02-12 VITALS — BP 140/70 | HR 80 | Ht 72.0 in | Wt 250.8 lb

## 2016-02-12 DIAGNOSIS — I2510 Atherosclerotic heart disease of native coronary artery without angina pectoris: Secondary | ICD-10-CM

## 2016-02-12 DIAGNOSIS — Z79899 Other long term (current) drug therapy: Secondary | ICD-10-CM

## 2016-02-12 DIAGNOSIS — I209 Angina pectoris, unspecified: Secondary | ICD-10-CM | POA: Diagnosis not present

## 2016-02-12 DIAGNOSIS — I10 Essential (primary) hypertension: Secondary | ICD-10-CM | POA: Diagnosis not present

## 2016-02-12 DIAGNOSIS — E785 Hyperlipidemia, unspecified: Secondary | ICD-10-CM

## 2016-02-12 DIAGNOSIS — I480 Paroxysmal atrial fibrillation: Secondary | ICD-10-CM

## 2016-02-12 HISTORY — DX: Atherosclerotic heart disease of native coronary artery without angina pectoris: I25.10

## 2016-02-12 NOTE — Patient Instructions (Addendum)
,  Your physician recommends that you continue on your current medications as directed. Please refer to the Current Medication list given to you today.  Your physician recommends that you return for lab work in: TODAY  AMIODARONE  AND  1 MONTHS  LIVER   Your physician wants you to follow-up in: [place] will receive a reminder letter in the mail two months in advance. If you don't receive a letter, please call our office to schedule the follow-up appointment.  You have been referred to DR CAMNITZ  FOR  AFIB

## 2016-02-12 NOTE — Progress Notes (Signed)
Cardiology Office Note    Date:  02/12/2016   ID:  ESSEX HABA, DOB Oct 15, 1950, MRN [id number]  PCP:  Alesia Richards, MD  Cardiologist:  Fransico Him, MD   Chief Complaint  Patient presents with  . Coronary Artery Disease  . Hypertension  . Atrial Fibrillation    History of Present Illness:  BETH BEDSWORTH is a 65 y.o. male who presents for followup.  He has a history of atrial fibrillation s/p  DCCV 03/2015 to NSR but before discharge he went back into afib. He also has a history of HTN, dyslipidemia, DM, ASCAD cath showing 50% LM, 40% distal LAD, 70% ost to prox LCs, 40% mid LAD and 95% mid to dist RCA s/p PCI with DES x 2 and stage PCI of the LCx/OM. At last OV he was loaded with Amio and then he was set up to have DCCV which he successfully had done in July 2017.  He is doing well today.  He denies any anginal chest pain, SOB(except exertion in the heat),  palpitations, fatigue or syncope. He occasionally will have some LE edema due to an ankle injury.    Past Medical History:  Diagnosis Date  . Allergy   . Arthritis    lumbar  . CAD (coronary artery disease), native coronary artery 02/12/2016   cath showing 50% LM, 40% distal LAD, 70% ost to prox LCs, 40% mid LAD and 95% mid to dist RCA s/p PCI with DES x 2 and stage PCI of the LCx/OM.  [name] Diabetes mellitus without complication ([place])   . Hyperlipidemia   . Hypertension   . OSA (obstructive sleep apnea)    noncompliant with CPAP  . PAF (paroxysmal atrial fibrillation) ([place])    s/p DCCV 03/2015 with reversion back to atrial fibrillation, loaded with Amio and s/p DCCV to NSR 11/2015  . Thyroid disease   . Vitamin D deficiency     Past Surgical History:  Procedure Laterality Date  . CARDIAC CATHETERIZATION N/A 10/11/2015   Procedure: Left Heart Cath and Coronary Angiography;  Surgeon: [name], MD;  Location: [facility];  Service: Cardiovascular;  Laterality: N/A;  . CARDIAC CATHETERIZATION N/A  10/11/2015   Procedure: Coronary Stent Intervention;  Surgeon: [name], MD;  Location: [facility];  Service: Cardiovascular;  Laterality: N/A;  . CARDIAC CATHETERIZATION N/A 10/11/2015   Procedure: Intravascular Pressure Wire/FFR Study;  Surgeon: [name], MD;  Location: [facility];  Service: Cardiovascular;  Laterality: N/A;  . CARDIAC CATHETERIZATION N/A 10/14/2015   Procedure: Coronary Stent Intervention;  Surgeon: [name], MD;  Location: [facility];  Service: Cardiovascular;  Laterality: N/A;  . CARDIOVERSION N/A 04/05/2015   Procedure: CARDIOVERSION;  Surgeon: [name], MD;  Location: [facility];  Service: Cardiovascular;  Laterality: N/A;  . CARDIOVERSION N/A 12/09/2015   Procedure: CARDIOVERSION;  Surgeon: [name], MD;  [facility];  Service: Cardiovascular;  Laterality: N/A;  . JOINT REPLACEMENT Bilateral 2001   Hips    Current Medications: Outpatient Medications Prior to Visit  Medication Sig Dispense Refill  . allopurinol (ZYLOPRIM) 300 MG tablet take 1 tablet by mouth once daily 30 tablet 2  . amiodarone (PACERONE) 200 MG tablet Take 1 tablet (200 mg total) by mouth as directed. Take 400mg  by mouth twice a day for 2 weeks. Then decrease to 200mg  twice daily for 1 week. Then decrease to 300mg  once a day. Botines  tablet 6  . amLODipine (NORVASC) 5 MG tablet Take 1 tablet (5 mg total) by mouth daily. 30 tablet 11  . atenolol (TENORMIN) 100 MG tablet take 1/2 tablet by mouth twice a day 90 tablet 3  . atorvastatin (LIPITOR) 80 MG tablet take 1 tablet by mouth once daily AT 6 PM 90 tablet 0  . B Complex Vitamins (B COMPLEX PO) Take 1 tablet by mouth daily.    . cholecalciferol (VITAMIN D) 1000 UNITS tablet Take 1,000 Units by mouth daily.    . clopidogrel (PLAVIX) 75 MG tablet Take 1 tablet (75 mg total) by mouth daily with breakfast. 30 tablet 11  . HYDROcodone-acetaminophen (NORCO/VICODIN) 5-325 MG tablet Take 1 tablet  by mouth every 6 (six) hours as needed for moderate pain. 90 tablet 0  . levothyroxine (SYNTHROID, LEVOTHROID) 50 MCG tablet take 1 tablet by mouth once daily (Patient taking differently: take 1 tablet by mouth once daily--take one and one half tablet on Tuesday and Thurdays) 90 tablet 0  . loratadine (CLARITIN) 10 MG tablet Take 10 mg by mouth daily.    . magnesium gluconate (MAGONATE) 500 MG tablet Take 500 mg by mouth 2 (two) times daily.     . metFORMIN (GLUCOPHAGE) 500 MG tablet take 1 tablet by mouth twice a day 60 tablet 3  . nystatin cream (MYCOSTATIN) Apply 1 application topically 2 (two) times daily as needed (wound care). 30 g 1  . Omega-3 Fatty Acids (FISH OIL) 1200 MG CAPS Take 1,200 mg by mouth daily.    . rivaroxaban (XARELTO) 20 MG TABS tablet Take 1 tablet (20 mg total) by mouth daily with supper. 30 tablet 4  . valsartan (DIOVAN) 320 MG tablet take 1 tablet by mouth once daily 90 tablet 0   No facility-administered medications prior to visit.      Allergies:   Bee venom   Social History   Social History  . Marital status: Single    Spouse name: N/A  . Number of children: N/A  . Years of education: N/A   Social History Main Topics  . Smoking status: Never Smoker  . Smokeless tobacco: Never Used  . Alcohol use No  . Drug use: No  . Sexual activity: Not Currently   Other Topics Concern  . None   Social History Animal nutritionist      Family History:  The patient's family history includes Cancer (age of onset: 27) in his father; Cancer (age of onset: 68) in his mother; Cancer (age of onset: 39) in his sister.   ROS:   Please see the history of present illness.    Review of Systems  Gastrointestinal: Positive for constipation.   All other systems reviewed and are negative.  No flowsheet data found.     PHYSICAL EXAM:   VS:  BP 140/70   Pulse 80   Ht 6' (1.829 m)   Wt 250 lb 12.8 oz (113.8 kg)   SpO2 96%   BMI 34.01 kg/m    GEN: Well  nourished, well developed, in no acute distress  HEENT: normal  Neck: no JVD, carotid bruits, or masses Cardiac: RRR; no murmurs, rubs, or gallops,no edema.  Intact distal pulses bilaterally.  Respiratory:  clear to auscultation bilaterally, normal work of breathing GI: soft, nontender, nondistended, + BS MS: no deformity or atrophy  Skin: warm and dry, no rash Neuro:  Alert and Oriented x 3, Strength and sensation are intact Psych: euthymic mood, full affect  Wt Readings from Last 3 Encounters:  02/12/16 250 lb 12.8 oz (113.8 kg)  01/13/16 256 lb (116.1 kg)  01/01/16 255 lb (115.7 kg)      Studies/Labs Reviewed:   EKG:  EKG is ordered today.  The ekg ordered today demonstrates atrial flutter with CVR at 86bpm.    Recent Labs: 12/10/2015: ALT 18; Hemoglobin 15.2; Platelets 228 02/05/2016: BUN 22; Creat 1.30; Magnesium 1.7; Potassium 4.0; Sodium 142; TSH 6.67   Lipid Panel    Component Value Date/Time   CHOL 90 (L) 12/03/2015 0950   TRIG 99 12/03/2015 0950   HDL 39 (L) 12/03/2015 0950   CHOLHDL 2.3 12/03/2015 0950   VLDL 20 12/03/2015 0950   LDLCALC 31 12/03/2015 0950    Additional studies/ records that were reviewed today include:  none    ASSESSMENT:    1. PAF (paroxysmal atrial fibrillation) ([place])   2. Essential hypertension   3. Hyperlipidemia   4. Coronary artery disease involving native coronary artery of native heart without angina pectoris   5. Encounter for long-term (current) use of other high-risk medications      PLAN:  In order of problems listed above:  1. PAF - now reverted to atrial flutter despite recent DCCV on Amio.  He is tolerating Amio without any side effects. He will continue on Amio/Xarelto and Tenormin for now. I will get an amio level to make sure he is not subtherapeutic before calling an Amio failure.  In the mean time I am going to refer him to Dr. Curt Bears to see if he is a candidate for afib ablation.  LA was mildly dilated on echo a  year ago.  Check LFTs next month. TSH mildly elevated - will defer to PCP.  May be Amio related. 2. HTN - BP controlled on current meds.  Continue amlodipine/BB/ARB. 3. Hyperlipidemia - LDL goal < 70.  Continue statin. 4. ASCAD -  cath showing 50% LM, 40% distal LAD, 70% ost to prox LCs, 40% mid LAD and 95% mid to dist RCA s/p PCI with DES x 2 and stage PCI of the LCx/OM.  He has not had any further angina. He will continue on Xarelto and Plavix.  LDL at goal at 31.    Medication Adjustments/Labs and Tests Ordered: Current medicines are reviewed at length with the patient today.  Concerns regarding medicines are outlined above.  Medication changes, Labs and Tests ordered today are listed in the Patient Instructions below.  Patient Instructions  ,Your physician recommends that you continue on your current medications as directed. Please refer to the Current Medication list given to you today.  Your physician recommends that you return for lab work in: TODAY  AMIODARONE  AND  1 MONTHS  LIVER   Your physician wants you to follow-up in: [place] will receive a reminder letter in the mail two months in advance. If you don't receive a letter, please call our office to schedule the follow-up appointment.  You have been referred to [facility]  FOR  AFIB     Signed, Fransico Him, MD  02/12/2016 11:09 AM    [address], [place], [place]  [address] Phone: [phone]; Fax: [phone]

## 2016-02-19 LAB — AMIODARONE LEVEL
Amiodarone Lvl: 0.9 ug/mL — ABNORMAL LOW (ref 1.5–2.5)
DESETHYLAMIODARONE: 0.6 ug/mL — AB (ref 1.5–2.5)

## 2016-02-21 ENCOUNTER — Telehealth: Payer: Self-pay | Admitting: Cardiology

## 2016-02-21 NOTE — Telephone Encounter (Signed)
Follow Up ° °Pt returning call from earlier. Please call. °

## 2016-02-24 ENCOUNTER — Telehealth: Payer: Self-pay | Admitting: Cardiology

## 2016-02-24 NOTE — Telephone Encounter (Signed)
New message    Patient returning call back from [place] on lab results.

## 2016-02-24 NOTE — Telephone Encounter (Signed)
-----   Message from [name], MD sent at 02/20/2016 10:32 AM EDT ----- Amio level borderline - please make sure patient has appt with Dr. Curt Bears

## 2016-02-24 NOTE — Progress Notes (Signed)
Electrophysiology Office Note   Date:  02/25/2016   ID:  Christopher Wade, DOB 10-Dec-1950, MRN [id number]  PCP:  Alesia Richards, MD  Cardiologist:  Radford Pax Primary Electrophysiologist:  Maira Christon [name], MD    Chief Complaint  Patient presents with  . New Patient (Initial Visit)    PAF     History of Present Illness: Christopher Wade is a 65 y.o. male who presents today for electrophysiology evaluation.   He has a history of atrial fibrillation s/p DCCV 03/2015 to NSR but before discharge he went back into afib. He also has a history of HTN, dyslipidemia, DM, ASCAD cath showing 50% LM, 40% distal LAD, 70% ost to prox LCs, 40% mid LAD and 95% mid to dist RCA s/p PCI with DES x 2 and stage PCI of the LCx/OM. At last OV he was loaded with Amio and then he was set up to have DCCV which he successfully had done in July 2017. Since then, he has reverted back into atrial flutter, which he is in today. He does complain of mild shortness of breath and fatigue. He does not notice palpitations. 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 He does have a sleep apnea diagnosis, but has not been compliant with his CPAP, as he says that it makes his throat dry and he wakes up due to discomfort.   Today, he denies symptoms of palpitations, chest pain, orthopnea, PND, lower extremity edema, claudication, dizziness, presyncope, syncope, bleeding, or neurologic sequela. The patient is tolerating medications without difficulties and is otherwise without complaint today.    Past Medical History:  Diagnosis Date  . Allergy   . Arthritis    lumbar  . CAD (coronary artery disease), native coronary artery 02/12/2016   cath showing 50% LM, 40% distal LAD, 70% ost to prox LCs, 40% mid LAD and 95% mid to dist RCA s/p PCI with DES x 2 and stage PCI of the LCx/OM.  [name] Diabetes mellitus without complication ([place])   . Hyperlipidemia   .  Hypertension   . OSA (obstructive sleep apnea)    noncompliant with CPAP  . PAF (paroxysmal atrial fibrillation) ([place])    s/p DCCV 03/2015 with reversion back to atrial fibrillation, loaded with Amio and s/p DCCV to NSR 11/2015  . Thyroid disease   . Vitamin D deficiency    Past Surgical History:  Procedure Laterality Date  . CARDIAC CATHETERIZATION N/A 10/11/2015   Procedure: Left Heart Cath and Coronary Angiography;  Surgeon: [name], MD;  Location: [facility];  Service: Cardiovascular;  Laterality: N/A;  . CARDIAC CATHETERIZATION N/A 10/11/2015   Procedure: Coronary Stent Intervention;  Surgeon: [name], MD;  Location: [facility];  Service: Cardiovascular;  Laterality: N/A;  . CARDIAC CATHETERIZATION N/A 10/11/2015   Procedure: Intravascular Pressure Wire/FFR Study;  Surgeon: [name], MD;  Location: [facility];  Service: Cardiovascular;  Laterality: N/A;  . CARDIAC CATHETERIZATION N/A 10/14/2015   Procedure: Coronary Stent Intervention;  Surgeon: [name], MD;  Location: [facility];  Service: Cardiovascular;  Laterality: N/A;  . CARDIOVERSION N/A 04/05/2015   Procedure: CARDIOVERSION;  Surgeon: [name], MD;  [facility];  Service: Cardiovascular;  Laterality: N/A;  . CARDIOVERSION N/A 12/09/2015   Procedure: CARDIOVERSION;  Surgeon: [name], MD;  Location: [facility];  Service: Cardiovascular;  Laterality: N/A;  . JOINT REPLACEMENT Bilateral 2001   Hips     Current Outpatient Prescriptions  Medication Sig Dispense Refill  . allopurinol (ZYLOPRIM) 300 MG tablet Take 150 mg by mouth daily.    [name] amiodarone (PACERONE) 200 MG tablet Take 300 mg by mouth daily. Take one and a half tablets (300 mg) daily    . amLODipine (NORVASC) 5 MG tablet Take 1 tablet (5 mg total) by mouth daily. 30 tablet 11  . atenolol (TENORMIN) 100 MG tablet take 1/2 tablet by mouth twice a day 90 tablet 3  . atorvastatin  (LIPITOR) 80 MG tablet take 1 tablet by mouth once daily AT 6 PM 90 tablet 0  . B Complex Vitamins (B COMPLEX PO) Take 1 tablet by mouth daily.    . cholecalciferol (VITAMIN D) 1000 UNITS tablet Take 1,000 Units by mouth daily.    . clopidogrel (PLAVIX) 75 MG tablet Take 1 tablet (75 mg total) by mouth daily with breakfast. 30 tablet 11  . HYDROcodone-acetaminophen (NORCO/VICODIN) 5-325 MG tablet Take 1 tablet by mouth every 6 (six) hours as needed for moderate pain. 90 tablet 0  . levothyroxine (SYNTHROID, LEVOTHROID) 50 MCG tablet take 1 tablet by mouth once daily (Patient taking differently: take 1 tablet by mouth once daily--take one and one half tablet on Tuesday and Thurdays) 90 tablet 0  . loratadine (CLARITIN) 10 MG tablet Take 10 mg by mouth daily.    . magnesium gluconate (MAGONATE) 500 MG tablet Take 500 mg by mouth 2 (two) times daily.     . metFORMIN (GLUCOPHAGE) 500 MG tablet take 1 tablet by mouth twice a day 60 tablet 3  . nystatin cream (MYCOSTATIN) Apply 1 application topically 2 (two) times daily as needed (wound care). 30 g 1  . Omega-3 Fatty Acids (FISH OIL) 1200 MG CAPS Take 1,200 mg by mouth daily.    . rivaroxaban (XARELTO) 20 MG TABS tablet Take 1 tablet (20 mg total) by mouth daily with supper. 30 tablet 4  . valsartan (DIOVAN) 320 MG tablet take 1 tablet by mouth once daily 90 tablet 0   No current facility-administered medications for this visit.     Allergies:   Bee venom   Social History:  The patient  reports that he has never smoked. He has never used smokeless tobacco. He reports that he does not drink alcohol or use drugs.   Family History:  The patient's family history includes Cancer (age of onset: 37) in his father; Cancer (age of onset: 45) in his mother; Cancer (age of onset: 77) in his sister.    ROS:  Please see the history of present illness.   Otherwise, review of systems is positive for DOE, constipation.   All other systems are reviewed and  negative.    PHYSICAL EXAM: VS:  BP (!) 136/98   Pulse 98   Ht 6' (1.829 m)   Wt 255 lb 6.4 oz (115.8 kg)   BMI 34.64 kg/m  , BMI Body mass index is 34.64 kg/m. GEN: Well nourished, well developed, in no acute distress  HEENT: normal  Neck: no JVD, carotid bruits, or masses Cardiac: iRRR; no murmurs, rubs, or gallops,no edema  Respiratory:  clear to auscultation bilaterally, normal work of breathing GI: soft, nontender, nondistended, + BS MS: no deformity or atrophy  Skin: warm and dry Neuro:  Strength and sensation are intact Psych: euthymic mood, full affect  EKG:  EKG is ordered today. Personal review of  the ekg ordered shows atrial flutter, rate 98, variable block  Recent Labs: 12/10/2015: ALT 18; Hemoglobin 15.2; Platelets 228 02/05/2016: BUN 22; Creat 1.30; Magnesium 1.7; Potassium 4.0; Sodium 142; TSH 6.67    Lipid Panel     Component Value Date/Time   CHOL 90 (L) 12/03/2015 0950   TRIG 99 12/03/2015 0950   HDL 39 (L) 12/03/2015 0950   CHOLHDL 2.3 12/03/2015 0950   VLDL 20 12/03/2015 0950   LDLCALC 31 12/03/2015 0950     Wt Readings from Last 3 Encounters:  02/25/16 255 lb 6.4 oz (115.8 kg)  02/12/16 250 lb 12.8 oz (113.8 kg)  01/13/16 256 lb (116.1 kg)      Other studies Reviewed: Additional studies/ records that were reviewed today include: TTE 03/2015, Cath 10/14/15  Review of the above records today demonstrates:  - Left ventricle: The cavity size was normal. Wall thickness was   increased in a pattern of mild LVH. Systolic function was normal.   The estimated ejection fraction was in the range of 60% to 65%.   Indeterminant diastolic function (atrial fibrillation). Wall   motion was normal; there were no regional wall motion   abnormalities. - Aortic valve: There was no stenosis. - Aorta: Borderline dilated aortic root. - Mitral valve: There was trivial regurgitation. - Left atrium: The atrium was mildly dilated. - Right ventricle: The cavity  size was moderately dilated. Systolic   function was normal. - Right atrium: The atrium was mildly dilated. - Tricuspid valve: Peak RV-RA gradient (S): 32 mm Hg. - Pulmonary arteries: PA peak pressure: 35 mm Hg (S). - Inferior vena cava: The vessel was normal in size. The   respirophasic diameter changes were in the normal range (>= 50%),   consistent with normal central venous pressure.   Ost 1st Mrg to 1st Mrg lesion to mid circumflex, 95% stenosed. Post intervention with a 2.75 x 32 Synergy, postdilated to 3.5 mm, there is a 0% residual stenosis.  The Synergy stent extends from the OM back into the native circumflex, jailing the mid circumflex. Postprocedure, there is TIMI-3 flow in the circumflex with only a moderate stenosis at the jailed portion.   Cath 10/11/15  LM lesion, 50% stenosed.  Dist LAD lesion, 40% stenosed.  Ost Cx to Prox Cx lesion, 70% stenosed.  1st Mrg lesion, 70% stenosed.  Mid LAD lesion, 40% stenosed.  Mid RCA to Dist RCA lesion, 95% stenosed. Post intervention, there is a 0% residual stenosis. The lesion was not previously treated.   ASSESSMENT AND PLAN:  1.  Persistent atrial fibrillation: on amiodarone and Xarelto. He is in atrial flutter today. I did discuss the risks and benefits of antiarrhythmics versus ablation. Risks of ablation include bleeding, tamponade, heart block, stroke, and damage to surrounding organs. He understands these risks and feels that ablation would be appropriate. He would like to schedule ablation for January. In the interim. I have offered him a cardioversion to see if we can get him back into normal rhythm. He is agreeable to this.  This patients CHA2DS2-VASc Score and unadjusted Ischemic Stroke Rate (% per year) is equal to 4.8 % stroke rate/year from a score of 4  Above score calculated as 1 point each if present [CHF, HTN, DM, Vascular=MI/PAD/Aortic Plaque, Age if 65-74, or Male] Above score calculated as 2 points each  if present [Age > 75, or Stroke/TIA/TE]  2. Hypertension: Elevated diastolics today. Blood pressures have been well controlled in the past. No changes made  today.  3. Hyperlipidemia:Continue statin  4. CAD: s/p PCI to Cx and RCA.No current chest pain  5. OSA: Currently noncompliant with CPAP. His primary physician has given him a phone number to potentially help out with treatment of his sleep apnea. I did discuss with him the relationship between sleep apnea and atrial fibrillation and the importance of CPAP therapy.     Current medicines are reviewed at length with the patient today.   The patient does not have concerns regarding his medicines.  The following changes were made today:  none  Labs/ tests ordered today include:  No orders of the defined types were placed in this encounter.    Disposition:   FU with Baltasar Twilley 3 months  Signed, Cephas Revard [name], MD  02/25/2016 3:18 PM     [facility] [address] [place] [address] [phone] (office) [phone] (fax)

## 2016-02-24 NOTE — Telephone Encounter (Signed)
Informed patient of results and verbal understanding expressed.  Confirmed OV for tomorrow with Dr. Curt Bears. Patient was grateful for call.

## 2016-02-25 ENCOUNTER — Encounter (INDEPENDENT_AMBULATORY_CARE_PROVIDER_SITE_OTHER): Payer: Self-pay

## 2016-02-25 ENCOUNTER — Ambulatory Visit (INDEPENDENT_AMBULATORY_CARE_PROVIDER_SITE_OTHER): Payer: Medicare Other | Admitting: Cardiology

## 2016-02-25 ENCOUNTER — Encounter: Payer: Self-pay | Admitting: Cardiology

## 2016-02-25 VITALS — BP 136/98 | HR 98 | Ht 72.0 in | Wt 255.4 lb

## 2016-02-25 DIAGNOSIS — I4892 Unspecified atrial flutter: Secondary | ICD-10-CM

## 2016-02-25 NOTE — Patient Instructions (Signed)
Medication Instructions:  Your physician recommends that you continue on your current medications as directed. Please refer to the Current Medication list given to you today.  Labwork: None ordered  Testing/Procedures: Your physician has recommended that you have a Cardioversion (DCCV). Electrical Cardioversion uses a jolt of electricity to your heart either through paddles or wired patches attached to your chest. This is a controlled, usually prescheduled, procedure. Defibrillation is done under light anesthesia in the hospital, and you usually go home the day of the procedure. This is done to get your heart back into a normal rhythm. You are not awake for the procedure.   Please call Sherri, RN with date you would like to have this procedure.  Available dates are as follows (these are subject to change): 10/23, 10/24, 10/30, 11/02/, 11/06, 11/07, 11/10,   11/13, 11/17, 11/24  Your physician has recommended that you have an ablation. Catheter ablation is a medical procedure used to treat some cardiac arrhythmias (irregular heartbeats). During catheter ablation, a long, thin, flexible tube is put into a blood vessel in your groin (upper thigh), or neck. This tube is called an ablation catheter. It is then guided to your heart through the blood vessel. Radio frequency waves destroy small areas of heart tissue where abnormal heartbeats may cause an arrhythmia to start.  Sherri, RN will call you to arrange this procedure in January as requested.  It may be end of month/next before she calls you.  Follow-Up: To be determined once procedure has been scheduled.  If you need a refill on your cardiac medications before your next appointment, please call your pharmacy.  Thank you for choosing [facility]!!   Trinidad Curet, RN [phone]  Any Other Special Instructions Will Be Listed Below (If Applicable).   Electrical Cardioversion Electrical cardioversion is the delivery of a jolt of  electricity to change the rhythm of the heart. Sticky patches or metal paddles are placed on the chest to deliver the electricity from a device. This is done to restore a normal rhythm. A rhythm that is too fast or not regular keeps the heart from pumping well. Electrical cardioversion is done in an emergency if:   There is low or no blood pressure as a result of the heart rhythm.   Normal rhythm must be restored as fast as possible to protect the brain and heart from further damage.   It may save a life. Cardioversion may be done for heart rhythms that are not immediately life threatening, such as atrial fibrillation or flutter, in which:   The heart is beating too fast or is not regular.   Medicine to change the rhythm has not worked.   It is safe to wait in order to allow time for preparation.  Symptoms of the abnormal rhythm are bothersome.  The risk of stroke and other serious problems can be reduced. LET [facility] CARE PROVIDER KNOW ABOUT:   Any allergies you have.  All medicines you are taking, including vitamins, herbs, eye drops, creams, and over-the-counter medicines.  Previous problems you or members of your family have had with the use of anesthetics.   Any blood disorders you have.   Previous surgeries you have had.   Medical conditions you have. RISKS AND COMPLICATIONS  Generally, this is a safe procedure. However, problems can occur and include:   Breathing problems related to the anesthetic used.  A blood clot that breaks free and travels to other parts of your body. This could  cause a stroke or other problems. The risk of this is lowered by use of blood-thinning medicine (anticoagulant) prior to the procedure.  Cardiac arrest (rare). BEFORE THE PROCEDURE   You may have tests to detect blood clots in your heart and to evaluate heart function.  You may start taking anticoagulants so your blood does not clot as easily.   Medicines may be given to  help stabilize your heart rate and rhythm. PROCEDURE  You will be given medicine through an IV tube to reduce discomfort and make you sleepy (sedative).   An electrical shock will be delivered. AFTER THE PROCEDURE Your heart rhythm will be watched to make sure it does not change.    This information is not intended to replace advice given to you by your health care provider. Make sure you discuss any questions you have with your health care provider.   Document Released: 05/01/2002 Document Revised: 06/01/2014 Document Reviewed: 11/23/2012 Elsevier Interactive Patient Education 2016 Elsevier Inc.   Cardiac Ablation Cardiac ablation is a procedure to disable a small amount of heart tissue in very specific places. The heart has many electrical connections. Sometimes these connections are abnormal and can cause the heart to beat very fast or irregularly. By disabling some of the problem areas, heart rhythm can be improved or made normal. Ablation is done for people who:   Have Wolff-Parkinson-White syndrome.   Have other fast heart rhythms (tachycardia).   Have taken medicines for an abnormal heart rhythm (arrhythmia) that resulted in:   No success.   Side effects.   May have a high-risk heartbeat that could result in death.  LET [facility] CARE PROVIDER KNOW ABOUT:   Any allergies you have or any previous reactions you have had to X-ray dye, food (such as seafood), medicine, or tape.   All medicines you are taking, including vitamins, herbs, eye drops, creams, and over-the-counter medicines.   Previous problems you or members of your family have had with the use of anesthetics.   Any blood disorders you have.   Previous surgeries or procedures (such as a kidney transplant) you have had.   Medical conditions you have (such as kidney failure).  RISKS AND COMPLICATIONS Generally, cardiac ablation is a safe procedure. However, problems can occur and include:    Increased risk of cancer. Depending on how long it takes to do the ablation, the dose of radiation can be high.  Bruising and bleeding where a thin, flexible tube (catheter) was inserted during the procedure.   Bleeding into the chest, especially into the sac that surrounds the heart (serious).  Need for a permanent pacemaker if the normal electrical system is damaged.   The procedure may not be fully effective, and this may not be recognized for months. Repeat ablation procedures are sometimes required. BEFORE THE PROCEDURE   Follow any instructions from your health care provider regarding eating and drinking before the procedure.   Take your medicines as directed at regular times with water, unless instructed otherwise by your health care provider. If you are taking diabetes medicine, including insulin, ask how you are to take it and if there are any special instructions you should follow. It is common to adjust insulin dosing the day of the ablation.  PROCEDURE  An ablation is usually performed in a catheterization laboratory with the guidance of fluoroscopy. Fluoroscopy is a type of X-ray that helps your health care provider see images of your heart during the procedure.   An ablation  is a minimally invasive procedure. This means a small cut (incision) is made in either your neck or groin. Your health care provider will decide where to make the incision based on your medical history and physical exam.  An IV tube will be started before the procedure begins. You will be given an anesthetic or medicine to help you relax (sedative).  The skin on your neck or groin will be numbed. A needle will be inserted into a large vein in your neck or groin and catheters will be threaded to your heart.  A special dye that shows up on fluoroscopy pictures may be injected through the catheter. The dye helps your health care provider see the area of the heart that needs treatment.  The catheter  has electrodes on the tip. When the area of heart tissue that is causing the arrhythmia is found, the catheter tip will send an electrical current to the area and "scar" the tissue. Three types of energy can be used to ablate the heart tissue:   Heat (radiofrequency energy).   Laser energy.   Extreme cold (cryoablation).   When the area of the heart has been ablated, the catheter will be taken out. Pressure will be held on the insertion site. This will help the insertion site clot and keep it from bleeding. A bandage will be placed on the insertion site.  AFTER THE PROCEDURE   After the procedure, you will be taken to a recovery area where your vital signs (blood pressure, heart rate, and breathing) will be monitored. The insertion site will also be monitored for bleeding.   You will need to lie still for 4-6 hours. This is to ensure you do not bleed from the catheter insertion site.    This information is not intended to replace advice given to you by your health care provider. Make sure you discuss any questions you have with your health care provider.   Document Released: 09/27/2008 Document Revised: 06/01/2014 Document Reviewed: 10/03/2012 Elsevier Interactive Patient Education [employer].

## 2016-02-26 NOTE — Addendum Note (Signed)
Addended by: Stanton Kidney on: 02/26/2016 09:48 AM   Modules accepted: Orders

## 2016-02-28 ENCOUNTER — Telehealth: Payer: Self-pay | Admitting: Cardiology

## 2016-02-28 ENCOUNTER — Other Ambulatory Visit: Payer: Self-pay | Admitting: Internal Medicine

## 2016-02-28 DIAGNOSIS — Z01812 Encounter for preprocedural laboratory examination: Secondary | ICD-10-CM

## 2016-02-28 NOTE — Telephone Encounter (Signed)
New message      pt verbalized that they are returning call for rn to schedule procedure

## 2016-02-28 NOTE — Telephone Encounter (Signed)
Patient would like to have DCCV on 10/23 or 10/24. He understands I will schedule this and call him next week to review instructions.

## 2016-03-03 NOTE — Telephone Encounter (Signed)
Patient would like me to call him Thursday.  He currently driving and cannot write anything down.  Pt understands I will call him back on requested day.

## 2016-03-05 NOTE — Telephone Encounter (Signed)
Reviewed DCCV date/time/instructions. Letter of instructions left at front desk for patient to pick up when he comes in for lab work next week. He understands I will call him at a later time to schedule ablation.  He hopes that he will not need ablation procedure. Patient verbalized understanding and agreeable to plan.

## 2016-03-05 NOTE — Addendum Note (Signed)
Addended by: Stanton Kidney on: 03/05/2016 06:09 PM   Modules accepted: Orders

## 2016-03-06 ENCOUNTER — Other Ambulatory Visit: Payer: Self-pay | Admitting: Cardiology

## 2016-03-06 ENCOUNTER — Encounter: Payer: Self-pay | Admitting: *Deleted

## 2016-03-10 ENCOUNTER — Other Ambulatory Visit: Payer: Medicare Other

## 2016-03-10 DIAGNOSIS — I4891 Unspecified atrial fibrillation: Secondary | ICD-10-CM | POA: Diagnosis not present

## 2016-03-10 DIAGNOSIS — E039 Hypothyroidism, unspecified: Secondary | ICD-10-CM

## 2016-03-10 DIAGNOSIS — N189 Chronic kidney disease, unspecified: Secondary | ICD-10-CM | POA: Diagnosis not present

## 2016-03-10 DIAGNOSIS — E785 Hyperlipidemia, unspecified: Secondary | ICD-10-CM | POA: Diagnosis not present

## 2016-03-10 DIAGNOSIS — Z6835 Body mass index (BMI) 35.0-35.9, adult: Secondary | ICD-10-CM | POA: Diagnosis not present

## 2016-03-10 DIAGNOSIS — I129 Hypertensive chronic kidney disease with stage 1 through stage 4 chronic kidney disease, or unspecified chronic kidney disease: Secondary | ICD-10-CM | POA: Diagnosis not present

## 2016-03-11 LAB — TSH: TSH: 9 mIU/L — ABNORMAL HIGH (ref 0.40–4.50)

## 2016-03-13 ENCOUNTER — Other Ambulatory Visit: Payer: Medicare Other | Admitting: *Deleted

## 2016-03-13 DIAGNOSIS — Z01812 Encounter for preprocedural laboratory examination: Secondary | ICD-10-CM

## 2016-03-13 DIAGNOSIS — Z79899 Other long term (current) drug therapy: Secondary | ICD-10-CM

## 2016-03-13 LAB — HEPATIC FUNCTION PANEL
ALBUMIN: 4 g/dL (ref 3.6–5.1)
ALK PHOS: 91 U/L (ref 40–115)
ALT: 18 U/L (ref 9–46)
AST: 18 U/L (ref 10–35)
Bilirubin, Direct: 0.2 mg/dL (ref <=0.2)
Indirect Bilirubin: 0.5 mg/dL (ref 0.2–1.2)
Total Bilirubin: 0.7 mg/dL (ref 0.2–1.2)
Total Protein: 6.9 g/dL (ref 6.1–8.1)

## 2016-03-16 ENCOUNTER — Ambulatory Visit (HOSPITAL_COMMUNITY)
Admission: RE | Admit: 2016-03-16 | Discharge: 2016-03-16 | Disposition: A | Discharge status: Home / Self Care | Payer: Medicare Other | Source: Ambulatory Visit | Attending: Cardiology | Admitting: Cardiology

## 2016-03-16 ENCOUNTER — Ambulatory Visit (HOSPITAL_COMMUNITY): Payer: Medicare Other | Admitting: Anesthesiology

## 2016-03-16 ENCOUNTER — Encounter (HOSPITAL_COMMUNITY)
Admission: RE | Disposition: A | Discharge status: Home / Self Care | Payer: Self-pay | Source: Ambulatory Visit | Attending: Cardiology

## 2016-03-16 ENCOUNTER — Encounter (HOSPITAL_COMMUNITY): Payer: Self-pay | Admitting: Anesthesiology

## 2016-03-16 DIAGNOSIS — I481 Persistent atrial fibrillation: Secondary | ICD-10-CM

## 2016-03-16 DIAGNOSIS — I4819 Other persistent atrial fibrillation: Secondary | ICD-10-CM

## 2016-03-16 DIAGNOSIS — Z7984 Long term (current) use of oral hypoglycemic drugs: Secondary | ICD-10-CM | POA: Insufficient documentation

## 2016-03-16 DIAGNOSIS — E119 Type 2 diabetes mellitus without complications: Secondary | ICD-10-CM | POA: Insufficient documentation

## 2016-03-16 DIAGNOSIS — I10 Essential (primary) hypertension: Secondary | ICD-10-CM | POA: Insufficient documentation

## 2016-03-16 DIAGNOSIS — Z7901 Long term (current) use of anticoagulants: Secondary | ICD-10-CM | POA: Insufficient documentation

## 2016-03-16 DIAGNOSIS — I2510 Atherosclerotic heart disease of native coronary artery without angina pectoris: Secondary | ICD-10-CM | POA: Insufficient documentation

## 2016-03-16 DIAGNOSIS — E559 Vitamin D deficiency, unspecified: Secondary | ICD-10-CM | POA: Diagnosis not present

## 2016-03-16 DIAGNOSIS — M1990 Unspecified osteoarthritis, unspecified site: Secondary | ICD-10-CM | POA: Insufficient documentation

## 2016-03-16 DIAGNOSIS — Z79899 Other long term (current) drug therapy: Secondary | ICD-10-CM | POA: Diagnosis not present

## 2016-03-16 DIAGNOSIS — E079 Disorder of thyroid, unspecified: Secondary | ICD-10-CM | POA: Insufficient documentation

## 2016-03-16 DIAGNOSIS — Z7902 Long term (current) use of antithrombotics/antiplatelets: Secondary | ICD-10-CM | POA: Insufficient documentation

## 2016-03-16 DIAGNOSIS — G4730 Sleep apnea, unspecified: Secondary | ICD-10-CM | POA: Diagnosis not present

## 2016-03-16 DIAGNOSIS — E785 Hyperlipidemia, unspecified: Secondary | ICD-10-CM | POA: Diagnosis not present

## 2016-03-16 DIAGNOSIS — G4733 Obstructive sleep apnea (adult) (pediatric): Secondary | ICD-10-CM | POA: Insufficient documentation

## 2016-03-16 DIAGNOSIS — I480 Paroxysmal atrial fibrillation: Secondary | ICD-10-CM | POA: Diagnosis not present

## 2016-03-16 DIAGNOSIS — I4891 Unspecified atrial fibrillation: Secondary | ICD-10-CM | POA: Diagnosis present

## 2016-03-16 DIAGNOSIS — E139 Other specified diabetes mellitus without complications: Secondary | ICD-10-CM | POA: Diagnosis not present

## 2016-03-16 HISTORY — PX: CARDIOVERSION: SHX1299

## 2016-03-16 LAB — GLUCOSE, CAPILLARY: Glucose-Capillary: 109 mg/dL — ABNORMAL HIGH (ref 65–99)

## 2016-03-16 SURGERY — CARDIOVERSION
Anesthesia: Monitor Anesthesia Care

## 2016-03-16 MED ORDER — PROPOFOL 10 MG/ML IV BOLUS
INTRAVENOUS | Status: DC | PRN
  Administered 2016-03-16: 100 mg via INTRAVENOUS

## 2016-03-16 MED ORDER — LIDOCAINE HCL (CARDIAC) 20 MG/ML IV SOLN
INTRAVENOUS | Status: DC | PRN
  Administered 2016-03-16: 40 mg via INTRAVENOUS

## 2016-03-16 MED ORDER — SODIUM CHLORIDE 0.9 % IV SOLN
INTRAVENOUS | Status: DC
  Administered 2016-03-16: 500 mL via INTRAVENOUS

## 2016-03-16 NOTE — Discharge Instructions (Signed)
Electrical Cardioversion, Care After °Refer to this sheet in the next few weeks. These instructions provide you with information on caring for yourself after your procedure. Your health care provider may also give you more specific instructions. Your treatment has been planned according to current medical practices, but problems sometimes occur. Call your health care provider if you have any problems or questions after your procedure. °WHAT TO EXPECT AFTER THE PROCEDURE °After your procedure, it is typical to have the following sensations: °· Some redness on the skin where the shocks were delivered. If this is tender, a sunburn lotion or hydrocortisone cream may help. °· Possible return of an abnormal heart rhythm within hours or days after the procedure. °HOME CARE INSTRUCTIONS °· Take medicines only as directed by your health care provider. Be sure you understand how and when to take your medicine. °· Learn how to feel your pulse and check it often. °· Limit your activity for 48 hours after the procedure or as directed by your health care provider. °· Avoid or minimize caffeine and other stimulants as directed by your health care provider. °SEEK MEDICAL CARE IF: °· You feel like your heart is beating too fast or your pulse is not regular. °· You have any questions about your medicines. °· You have bleeding that will not stop. °SEEK IMMEDIATE MEDICAL CARE IF: °· You are dizzy or feel faint. °· It is hard to breathe or you feel short of breath. °· There is a change in discomfort in your chest. °· Your speech is slurred or you have trouble moving an arm or leg on one side of your body. °· You get a serious muscle cramp that does not go away. °· Your fingers or toes turn cold or blue. °  °This information is not intended to replace advice given to you by your health care provider. Make sure you discuss any questions you have with your health care provider. °  °Document Released: 03/01/2013 Document Revised: 06/01/2014  Document Reviewed: 03/01/2013 °Elsevier Interactive Patient Education ©2016 [employer] ° °

## 2016-03-16 NOTE — Transfer of Care (Signed)
Immediate Anesthesia Transfer of Care Note  Patient: Christopher Wade  Procedure(s) Performed: Procedure(s): CARDIOVERSION (N/A)  Patient Location: Endoscopy Unit  Anesthesia Type:MAC  Level of Consciousness: awake, oriented and patient cooperative  Airway & Oxygen Therapy: Patient Spontanous Breathing and Patient connected to nasal cannula oxygen  Post-op Assessment: Report given to RN and Post -op Vital signs reviewed and stable  Post vital signs: Reviewed  Last Vitals:  Vitals:   03/16/16 1341  BP: (!) 180/118  Pulse: 87  Resp: (!) 24  Temp: 36.7 C    Last Pain:  Vitals:   03/16/16 1341  TempSrc: Oral         Complications: No apparent anesthesia complications

## 2016-03-16 NOTE — H&P (View-Only) (Signed)
Electrophysiology Office Note   Date:  02/25/2016   ID:  [name], DOB [birth year], MRN [id number]  PCP:  Alesia Richards, MD  Cardiologist:  Radford Pax Primary Electrophysiologist:  Will [name], MD    Chief Complaint  Patient presents with  . New Patient (Initial Visit)    PAF     History of Present Illness: Christopher Wade is a 65 y.o. male who presents today for electrophysiology evaluation.   He has a history of atrial fibrillation s/p DCCV 03/2015 to NSR but before discharge he went back into afib. He also has a history of HTN, dyslipidemia, DM, ASCAD cath showing 50% LM, 40% distal LAD, 70% ost to prox LCs, 40% mid LAD and 95% mid to dist RCA s/p PCI with DES x 2 and stage PCI of the LCx/OM. At last OV he was loaded with Amio and then he was set up to have DCCV which he successfully had done in July 2017. Since then, he has reverted back into atrial flutter, which he is in today. He does complain of mild shortness of breath and fatigue. He does not notice palpitations. 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 He does have a sleep apnea diagnosis, but has not been compliant with his CPAP, as he says that it makes his throat dry and he wakes up due to discomfort.   Today, he denies symptoms of palpitations, chest pain, orthopnea, PND, lower extremity edema, claudication, dizziness, presyncope, syncope, bleeding, or neurologic sequela. The patient is tolerating medications without difficulties and is otherwise without complaint today.    Past Medical History:  Diagnosis Date  . Allergy   . Arthritis    lumbar  . CAD (coronary artery disease), native coronary artery 02/12/2016   cath showing 50% LM, 40% distal LAD, 70% ost to prox LCs, 40% mid LAD and 95% mid to dist RCA s/p PCI with DES x 2 and stage PCI of the LCx/OM.  [name] Diabetes mellitus without complication ([place])   . Hyperlipidemia   .  Hypertension   . OSA (obstructive sleep apnea)    noncompliant with CPAP  . PAF (paroxysmal atrial fibrillation) ([place])    s/p DCCV 03/2015 with reversion back to atrial fibrillation, loaded with Amio and s/p DCCV to NSR 11/2015  . Thyroid disease   . Vitamin D deficiency    Past Surgical History:  Procedure Laterality Date  . CARDIAC CATHETERIZATION N/A 10/11/2015   Procedure: Left Heart Cath and Coronary Angiography;  Surgeon: [name], MD;  Location: [place] [facility];  Service: Cardiovascular;  Laterality: N/A;  . CARDIAC CATHETERIZATION N/A 10/11/2015   Procedure: Coronary Stent Intervention;  Surgeon: [name], MD;  Location: [place] [facility];  Service: Cardiovascular;  Laterality: N/A;  . CARDIAC CATHETERIZATION N/A 10/11/2015   Procedure: Intravascular Pressure Wire/FFR Study;  Surgeon: [name], MD;  Location: [place] [facility];  Service: Cardiovascular;  Laterality: N/A;  . CARDIAC CATHETERIZATION N/A 10/14/2015   Procedure: Coronary Stent Intervention;  Surgeon: [name], MD;  Location: [place] [facility];  Service: Cardiovascular;  Laterality: N/A;  . CARDIOVERSION N/A 04/05/2015   Procedure: CARDIOVERSION;  Surgeon: [name], MD;  Location: [facility];  Service: Cardiovascular;  Laterality: N/A;  . CARDIOVERSION N/A 12/09/2015   Procedure: CARDIOVERSION;  Surgeon: [name], MD;  Location: [place];  Service: Cardiovascular;  Laterality: N/A;  . JOINT REPLACEMENT Bilateral 2001   Hips     Current Outpatient Prescriptions  Medication Sig Dispense Refill  . allopurinol (ZYLOPRIM) 300 MG tablet Take 150 mg by mouth daily.    [name] amiodarone (PACERONE) 200 MG tablet Take 300 mg by mouth daily. Take one and a half tablets (300 mg) daily    . amLODipine (NORVASC) 5 MG tablet Take 1 tablet (5 mg total) by mouth daily. 30 tablet 11  . atenolol (TENORMIN) 100 MG tablet take 1/2 tablet by mouth twice a day 90 tablet 3  . atorvastatin  (LIPITOR) 80 MG tablet take 1 tablet by mouth once daily AT 6 PM 90 tablet 0  . B Complex Vitamins (B COMPLEX PO) Take 1 tablet by mouth daily.    . cholecalciferol (VITAMIN D) 1000 UNITS tablet Take 1,000 Units by mouth daily.    . clopidogrel (PLAVIX) 75 MG tablet Take 1 tablet (75 mg total) by mouth daily with breakfast. 30 tablet 11  . HYDROcodone-acetaminophen (NORCO/VICODIN) 5-325 MG tablet Take 1 tablet by mouth every 6 (six) hours as needed for moderate pain. 90 tablet 0  . levothyroxine (SYNTHROID, LEVOTHROID) 50 MCG tablet take 1 tablet by mouth once daily (Patient taking differently: take 1 tablet by mouth once daily--take one and one half tablet on Tuesday and Thurdays) 90 tablet 0  . loratadine (CLARITIN) 10 MG tablet Take 10 mg by mouth daily.    . magnesium gluconate (MAGONATE) 500 MG tablet Take 500 mg by mouth 2 (two) times daily.     . metFORMIN (GLUCOPHAGE) 500 MG tablet take 1 tablet by mouth twice a day 60 tablet 3  . nystatin cream (MYCOSTATIN) Apply 1 application topically 2 (two) times daily as needed (wound care). 30 g 1  . Omega-3 Fatty Acids (FISH OIL) 1200 MG CAPS Take 1,200 mg by mouth daily.    . rivaroxaban (XARELTO) 20 MG TABS tablet Take 1 tablet (20 mg total) by mouth daily with supper. 30 tablet 4  . valsartan (DIOVAN) 320 MG tablet take 1 tablet by mouth once daily 90 tablet 0   No current facility-administered medications for this visit.     Allergies:   Bee Wade   Social History:  The patient  reports that he has never smoked. He has never used smokeless tobacco. He reports that he does not drink alcohol or use drugs.   Family History:  The patient's family history includes Cancer (age of onset: 24) in his father; Cancer (age of onset: 60) in his mother; Cancer (age of onset: 76) in his sister.    ROS:  Please see the history of present illness.   Otherwise, review of systems is positive for DOE, constipation.   All other systems are reviewed and  negative.    PHYSICAL EXAM: VS:  BP (!) 136/98   Pulse 98   Ht 6' (1.829 m)   Wt 255 lb 6.4 oz (115.8 kg)   BMI 34.64 kg/m  , BMI Body mass index is 34.64 kg/m. GEN: Well nourished, well developed, in no acute distress  HEENT: normal  Neck: no JVD, carotid bruits, or masses Cardiac: iRRR; no murmurs, rubs, or gallops,no edema  Respiratory:  clear to auscultation bilaterally, normal work of breathing GI: soft, nontender, nondistended, + BS MS: no deformity or atrophy  Skin: warm and dry Neuro:  Strength and sensation are intact Psych: euthymic mood, full affect  EKG:  EKG is ordered today. Personal review of  the ekg ordered shows atrial flutter, rate 98, variable block  Recent Labs: 12/10/2015: ALT 18; Hemoglobin 15.2; Platelets 228 02/05/2016: BUN 22; Creat 1.30; Magnesium 1.7; Potassium 4.0; Sodium 142; TSH 6.67    Lipid Panel     Component Value Date/Time   CHOL 90 (L) 12/03/2015 0950   TRIG 99 12/03/2015 0950   HDL 39 (L) 12/03/2015 0950   CHOLHDL 2.3 12/03/2015 0950   VLDL 20 12/03/2015 0950   LDLCALC 31 12/03/2015 0950     Wt Readings from Last 3 Encounters:  02/25/16 255 lb 6.4 oz (115.8 kg)  02/12/16 250 lb 12.8 oz (113.8 kg)  01/13/16 256 lb (116.1 kg)      Other studies Reviewed: Additional studies/ records that were reviewed today include: TTE 03/2015, Cath 10/14/15  Review of the above records today demonstrates:  - Left ventricle: The cavity size was normal. Wall thickness was   increased in a pattern of mild LVH. Systolic function was normal.   The estimated ejection fraction was in the range of 60% to 65%.   Indeterminant diastolic function (atrial fibrillation). Wall   motion was normal; there were no regional wall motion   abnormalities. - Aortic valve: There was no stenosis. - Aorta: Borderline dilated aortic root. - Mitral valve: There was trivial regurgitation. - Left atrium: The atrium was mildly dilated. - Right ventricle: The cavity  size was moderately dilated. Systolic   function was normal. - Right atrium: The atrium was mildly dilated. - Tricuspid valve: Peak RV-RA gradient (S): 32 mm Hg. - Pulmonary arteries: PA peak pressure: 35 mm Hg (S). - Inferior vena cava: The vessel was normal in size. The   respirophasic diameter changes were in the normal range (>= 50%),   consistent with normal central venous pressure.   Ost 1st Mrg to 1st Mrg lesion to mid circumflex, 95% stenosed. Post intervention with a 2.75 x 32 Synergy, postdilated to 3.5 mm, there is a 0% residual stenosis.  The Synergy stent extends from the OM back into the native circumflex, jailing the mid circumflex. Postprocedure, there is TIMI-3 flow in the circumflex with only a moderate stenosis at the jailed portion.   Cath 10/11/15  LM lesion, 50% stenosed.  Dist LAD lesion, 40% stenosed.  Ost Cx to Prox Cx lesion, 70% stenosed.  1st Mrg lesion, 70% stenosed.  Mid LAD lesion, 40% stenosed.  Mid RCA to Dist RCA lesion, 95% stenosed. Post intervention, there is a 0% residual stenosis. The lesion was not previously treated.   ASSESSMENT AND PLAN:  1.  Persistent atrial fibrillation: on amiodarone and Xarelto. He is in atrial flutter today. I did discuss the risks and benefits of antiarrhythmics versus ablation. Risks of ablation include bleeding, tamponade, heart block, stroke, and damage to surrounding organs. He understands these risks and feels that ablation would be appropriate. He would like to schedule ablation for January. In the interim. I have offered him a cardioversion to see if we can get him back into normal rhythm. He is agreeable to this.  This patients CHA2DS2-VASc Score and unadjusted Ischemic Stroke Rate (% per year) is equal to 4.8 % stroke rate/year from a score of 4  Above score calculated as 1 point each if present [CHF, HTN, DM, Vascular=MI/PAD/Aortic Plaque, Age if 65-74, or Male] Above score calculated as 2 points each  if present [Age > 75, or Stroke/TIA/TE]  2. Hypertension: Elevated diastolics today. Blood pressures have been well controlled in the past. No changes made  today.  3. Hyperlipidemia:Continue statin  4. CAD: s/p PCI to Cx and RCA.No current chest pain  5. OSA: Currently noncompliant with CPAP. His primary physician has given him a phone number to potentially help out with treatment of his sleep apnea. I did discuss with him the relationship between sleep apnea and atrial fibrillation and the importance of CPAP therapy.     Current medicines are reviewed at length with the patient today.   The patient does not have concerns regarding his medicines.  The following changes were made today:  none  Labs/ tests ordered today include:  No orders of the defined types were placed in this encounter.    Disposition:   FU with Will Camnitz 3 months  Signed, Will [name], MD  02/25/2016 3:18 PM     [facility] [address] [place]  [address] [phone] (office) [phone] (fax)

## 2016-03-16 NOTE — Interval H&P Note (Signed)
History and Physical Interval Note:  03/16/2016 2:02 PM  Christopher Wade  has presented today for surgery, with the diagnosis of a fib  The various methods of treatment have been discussed with the patient and family. After consideration of risks, benefits and other options for treatment, the patient has consented to  Procedure(s): CARDIOVERSION (N/A) as a surgical intervention .  The patient's history has been reviewed, patient examined, no change in status, stable for surgery.  I have reviewed the patient's chart and labs.  Questions were answered to the patient's satisfaction.     [name], MD 03/16/2016  2:02 PM

## 2016-03-16 NOTE — Anesthesia Postprocedure Evaluation (Signed)
Anesthesia Post Note  Patient: Christopher Wade  Procedure(s) Performed: Procedure(s) (LRB): CARDIOVERSION (N/A)  Patient location during evaluation: PACU Anesthesia Type: MAC Level of consciousness: awake and alert Pain management: pain level controlled Vital Signs Assessment: post-procedure vital signs reviewed and stable Respiratory status: spontaneous breathing, nonlabored ventilation, respiratory function stable and patient connected to nasal cannula oxygen Cardiovascular status: stable and blood pressure returned to baseline Anesthetic complications: no    Last Vitals:  Vitals:   03/16/16 1420 03/16/16 1425  BP: (!) 153/77 (!) 161/82  Pulse: (!) 59 (!) 57  Resp: (!) 8 18  Temp:      Last Pain:  Vitals:   03/16/16 1419  TempSrc: Oral                 Zenaida Deed

## 2016-03-16 NOTE — Anesthesia Preprocedure Evaluation (Signed)
Anesthesia Evaluation  Patient identified by MRN, date of birth, ID band Patient awake    Reviewed: Allergy & Precautions, NPO status , Patient's Chart, lab work & pertinent test results  Airway Mallampati: II  TM Distance: >3 FB Neck ROM: Full    Dental no notable dental hx.    Pulmonary sleep apnea ,    Pulmonary exam normal breath sounds clear to auscultation       Cardiovascular hypertension, + CAD  Normal cardiovascular exam+ dysrhythmias Atrial Fibrillation  Rhythm:Regular Rate:Normal     Neuro/Psych    GI/Hepatic   Endo/Other  diabetes  Renal/GU      Musculoskeletal   Abdominal   Peds  Hematology   Anesthesia Other Findings   Reproductive/Obstetrics                             Anesthesia Physical  Anesthesia Plan  ASA: III  Anesthesia Plan: MAC   Post-op Pain Management:    Induction: Intravenous  Airway Management Planned: Mask  Additional Equipment:   Intra-op Plan:   Post-operative Plan:   Informed Consent: I have reviewed the patients History and Physical, chart, labs and discussed the procedure including the risks, benefits and alternatives for the proposed anesthesia with the patient or authorized representative who has indicated his/her understanding and acceptance.     Plan Discussed with: CRNA and Anesthesiologist  Anesthesia Plan Comments:         Anesthesia Quick Evaluation

## 2016-03-17 ENCOUNTER — Encounter (HOSPITAL_COMMUNITY): Payer: Self-pay | Admitting: Cardiovascular Disease

## 2016-04-01 NOTE — CV Procedure (Signed)
Electrical Cardioversion Procedure Note [facility] [id number] 29-Nov-1950  Procedure: Electrical Cardioversion Indications:  Atrial Fibrillation  Procedure Details Consent: Risks of procedure as well as the alternatives and risks of each were explained to the (patient/caregiver).  Consent for procedure obtained. Time Out: Verified patient identification, verified procedure, site/side was marked, verified correct patient position, special equipment/implants available, medications/allergies/relevent history reviewed, required imaging and test results available.  Performed  Patient placed on cardiac monitor, pulse oximetry, supplemental oxygen as necessary.  Sedation given: Propofol Pacer pads placed anterior and posterior chest.  Cardioverted 1 time(s).  Cardioverted at 150J.  Evaluation Findings: Post procedure EKG shows: Sinus bradycardia Complications: None Patient did tolerate procedure well.   [name], MD 03/16/16

## 2016-04-13 ENCOUNTER — Other Ambulatory Visit: Payer: Medicare Other

## 2016-04-13 DIAGNOSIS — N179 Acute kidney failure, unspecified: Secondary | ICD-10-CM

## 2016-04-13 DIAGNOSIS — E079 Disorder of thyroid, unspecified: Secondary | ICD-10-CM

## 2016-04-13 LAB — BASIC METABOLIC PANEL WITH GFR
BUN: 23 mg/dL (ref 7–25)
CALCIUM: 9 mg/dL (ref 8.6–10.3)
CO2: 30 mmol/L (ref 20–31)
CREATININE: 1.24 mg/dL (ref 0.70–1.25)
Chloride: 105 mmol/L (ref 98–110)
GFR, Est African American: 70 mL/min (ref >=60)
GFR, Est Non African American: 61 mL/min (ref >=60)
Glucose, Bld: 94 mg/dL (ref 65–99)
Potassium: 3.8 mmol/L (ref 3.5–5.3)
SODIUM: 143 mmol/L (ref 135–146)

## 2016-04-13 LAB — HEPATIC FUNCTION PANEL
ALT: 19 U/L (ref 9–46)
AST: 20 U/L (ref 10–35)
Albumin: 4 g/dL (ref 3.6–5.1)
Alkaline Phosphatase: 77 U/L (ref 40–115)
BILIRUBIN DIRECT: 0.1 mg/dL (ref <=0.2)
BILIRUBIN TOTAL: 0.6 mg/dL (ref 0.2–1.2)
Indirect Bilirubin: 0.5 mg/dL (ref 0.2–1.2)
Total Protein: 7 g/dL (ref 6.1–8.1)

## 2016-04-13 LAB — TSH: TSH: 11.99 m[IU]/L — AB (ref 0.40–4.50)

## 2016-04-15 ENCOUNTER — Encounter: Payer: Self-pay | Admitting: *Deleted

## 2016-04-15 ENCOUNTER — Other Ambulatory Visit: Payer: Self-pay | Admitting: Physician Assistant

## 2016-04-15 ENCOUNTER — Telehealth: Payer: Self-pay | Admitting: *Deleted

## 2016-04-15 MED ORDER — LEVOTHYROXINE SODIUM 75 MCG PO TABS: 75.0000 ug | ORAL_TABLET | Freq: Every day | ORAL | 1 refills | Status: DC

## 2016-04-15 NOTE — Telephone Encounter (Signed)
Patient called stating he "hit his arm/wrist at work and wants [place] to call in Oxycodone.  Patient was called and notified that we can not call in a controlled Rx and he will need an office visit for further E/T or he can go to the UC. Patient expressed understanding.

## 2016-04-20 ENCOUNTER — Ambulatory Visit (INDEPENDENT_AMBULATORY_CARE_PROVIDER_SITE_OTHER): Payer: Medicare Other | Admitting: Physician Assistant

## 2016-04-20 VITALS — BP 194/96 | HR 72 | Temp 97.3°F | Resp 16 | Ht 72.0 in | Wt 260.2 lb

## 2016-04-20 DIAGNOSIS — I481 Persistent atrial fibrillation: Secondary | ICD-10-CM | POA: Diagnosis not present

## 2016-04-20 DIAGNOSIS — M7712 Lateral epicondylitis, left elbow: Secondary | ICD-10-CM | POA: Diagnosis not present

## 2016-04-20 DIAGNOSIS — I10 Essential (primary) hypertension: Secondary | ICD-10-CM

## 2016-04-20 DIAGNOSIS — I4819 Other persistent atrial fibrillation: Secondary | ICD-10-CM

## 2016-04-20 MED ORDER — HYDROCODONE-ACETAMINOPHEN 5-325 MG PO TABS: 1.0000 | ORAL_TABLET | Freq: Four times a day (QID) | ORAL | 0 refills | Status: DC | PRN

## 2016-04-20 NOTE — Progress Notes (Signed)
Subjective:    Patient ID: Christopher Wade, male    DOB: 02-18-1951, 65 y.o.   MRN: [id number]  HPI 65 y.o. right handed WM with history of afib on xarelto presents with arm injury at work. Was holding onto bead board at work above his head, wind took it and he held onto it and it jerked his right arm/forearm and felt acute pain at elbow down his arm. He has been taking aspirin, icing it, states that it is better but still in pain. He will occ take hydrocodone unable to take NSAIDS due to kidney function and xaretlo, last refill was 08/2015 per [place] site and patient.   BP is elevated today. States that may be due to pain, patient states at home it is normal.  BP Readings from Last 3 Encounters:  04/20/16 (!) 194/96  03/16/16 (!) 176/91  02/25/16 (!) 136/98   He is on 38mcg, and on 1.5 pill S/S/T/T.  Lab Results  Component Value Date   TSH 11.99 (H) 04/13/2016    Blood pressure (!) 194/96, pulse 72, temperature 97.3 F (36.3 C), resp. rate 16, height 6' (1.829 m), weight 260 lb 3.2 oz (118 kg).  Medications Current Outpatient Prescriptions on File Prior to Visit  Medication Sig  . allopurinol (ZYLOPRIM) 300 MG tablet Take 150 mg by mouth daily.  [name] amiodarone (PACERONE) 200 MG tablet Take 300 mg by mouth daily. Take one and a half tablets (300 mg) daily  . amLODipine (NORVASC) 5 MG tablet Take 1 tablet (5 mg total) by mouth daily.  [name] atenolol (TENORMIN) 100 MG tablet take 1/2 tablet by mouth twice a day  . atorvastatin (LIPITOR) 80 MG tablet take 1 tablet by mouth once daily AT 6PM  . B Complex Vitamins (B COMPLEX PO) Take 1 tablet by mouth daily.  . cholecalciferol (VITAMIN D) 1000 UNITS tablet Take 1,000 Units by mouth daily.  . clopidogrel (PLAVIX) 75 MG tablet Take 1 tablet (75 mg total) by mouth daily with breakfast.  . colchicine 0.6 MG tablet take 1 tablet by mouth once daily TO PREVENT GOUT  . HYDROcodone-acetaminophen (NORCO/VICODIN) 5-325 MG tablet Take 1 tablet by mouth  every 6 (six) hours as needed for moderate pain.  [name] levothyroxine (SYNTHROID, LEVOTHROID) 75 MCG tablet Take 1 tablet (75 mcg total) by mouth daily before breakfast.  . loratadine (CLARITIN) 10 MG tablet Take 10 mg by mouth daily.  . magnesium gluconate (MAGONATE) 500 MG tablet Take 500 mg by mouth 2 (two) times daily.   . metFORMIN (GLUCOPHAGE) 500 MG tablet take 1 tablet by mouth twice a day with food  . nystatin cream (MYCOSTATIN) Apply 1 application topically 2 (two) times daily as needed (wound care).  . Omega-3 Fatty Acids (FISH OIL) 1200 MG CAPS Take 1,200 mg by mouth daily.  . rivaroxaban (XARELTO) 20 MG TABS tablet Take 1 tablet (20 mg total) by mouth daily with supper.  . valsartan (DIOVAN) 320 MG tablet take 1 tablet by mouth once daily   No current facility-administered medications on file prior to visit.     Problem list He has Hyperlipidemia; DM type 2 causing CKD stage 2 ([place]); Thyroid disease; Vitamin D deficiency; Encounter for long-term (current) use of medications; Morbid obesity ([place]); Gout; Essential hypertension; On amiodarone therapy; PAF (paroxysmal atrial fibrillation) ([place]); OSA (obstructive sleep apnea); Encounter for Medicare annual wellness exam; Abnormal nuclear stress test; Dyspnea and respiratory abnormality; Abnormal PFT; CAD (coronary artery disease), native coronary artery; and Persistent  atrial fibrillation ([place]) on his problem list.  Review of Systems  Constitutional: Negative.   HENT: Negative.   Respiratory: Negative.   Cardiovascular: Negative.   Genitourinary: Negative.   Musculoskeletal: Positive for arthralgias.       Objective:   Physical Exam  Constitutional: He is oriented to person, place, and time. He appears well-nourished. No distress.  Cardiovascular: Normal rate and regular rhythm.   Pulmonary/Chest: Effort normal and breath sounds normal.  Musculoskeletal:  Left shoulder and left wrist full ROM and nontender.left elbow without  erythema, warmth. with minor swelling, with tenderness at lateral epicondyle with decreased grip. Pain with supination against resistance. Good distal pulses, cap refill, and sensation intact.  ROM Intact bilaterally in upper extremities.  Neurological: He is alert and oriented to person, place, and time. He has normal reflexes. No cranial nerve deficit.  Skin: Skin is warm and dry. No rash noted.       Assessment & Plan:  1. Essential hypertension - continue medications, DASH diet, exercise and monitor at home. Call if greater than 130/80.   2. Persistent atrial fibrillation (HCC) Continue cardio follow up  3. Lateral epicondylitis of left elbow Get brace, ice, exercises given, refiill hydrocodone, if not better will refer to ortho  Future Appointments Date Time Provider [place]  04/23/2016 4:00 PM Will [name], MD [facility]  05/21/2016 3:00 PM Vicie Mutters, PA-C [facility] None

## 2016-04-20 NOTE — Patient Instructions (Signed)
Ice your elbow, get brace, can do exercises and rest, if not better can do injection at your physical.   Monitor your BP, if it continues to be elevated, take 1 norvasc in the morning and 1 at night  Monitor your blood pressure at home. Go to the ER if any CP, SOB, nausea, dizziness, severe HA, changes vision/speech  Goal BP:  For patients younger than 60: Goal BP < 140/90. For patients 60 and older: Goal BP < 150/90. For patients with diabetes: Goal BP < 140/90. Your most recent BP: BP: (!) 194/96   Take your medications faithfully as instructed. Maintain a healthy weight. Get at least 150 minutes of aerobic exercise per week. Minimize salt intake. Minimize alcohol intake  DASH Eating Plan DASH stands for "Dietary Approaches to Stop Hypertension." The DASH eating plan is a healthy eating plan that has been shown to reduce high blood pressure (hypertension). Additional health benefits may include reducing the risk of type 2 diabetes mellitus, heart disease, and stroke. The DASH eating plan may also help with weight loss. WHAT DO I NEED TO KNOW ABOUT THE DASH EATING PLAN? For the DASH eating plan, you will follow these general guidelines:  Choose foods with a percent daily value for sodium of less than 5% (as listed on the food label).  Use salt-free seasonings or herbs instead of table salt or sea salt.  Check with your health care provider or pharmacist before using salt substitutes.  Eat lower-sodium products, often labeled as "lower sodium" or "no salt added."  Eat fresh foods.  Eat more vegetables, fruits, and low-fat dairy products.  Choose whole grains. Look for the word "whole" as the first word in the ingredient list.  Choose fish and skinless chicken or [place] more often than red meat. Limit fish, poultry, and meat to 6 oz (170 g) each day.  Limit sweets, desserts, sugars, and sugary drinks.  Choose heart-healthy fats.  Limit cheese to 1 oz (28 g) per day.  Eat  more home-cooked food and less restaurant, buffet, and fast food.  Limit fried foods.  Cook foods using methods other than frying.  Limit canned vegetables. If you do use them, rinse them well to decrease the sodium.  When eating at a restaurant, ask that your food be prepared with less salt, or no salt if possible. WHAT FOODS CAN I EAT? Seek help from a dietitian for individual calorie needs. Grains Whole grain or whole wheat bread. Brown rice. Whole grain or whole wheat pasta. Quinoa, bulgur, and whole grain cereals. Low-sodium cereals. Corn or whole wheat flour tortillas. Whole grain cornbread. Whole grain crackers. Low-sodium crackers. Vegetables Fresh or frozen vegetables (raw, steamed, roasted, or grilled). Low-sodium or reduced-sodium tomato and vegetable juices. Low-sodium or reduced-sodium tomato sauce and paste. Low-sodium or reduced-sodium canned vegetables.  Fruits All fresh, canned (in natural juice), or frozen fruits. Meat and Other Protein Products Ground beef (85% or leaner), grass-fed beef, or beef trimmed of fat. Skinless chicken or [place]. Ground chicken or [place]. Pork trimmed of fat. All fish and seafood. Eggs. Dried beans, peas, or lentils. Unsalted nuts and seeds. Unsalted canned beans. Dairy Low-fat dairy products, such as skim or 1% milk, 2% or reduced-fat cheeses, low-fat ricotta or cottage cheese, or plain low-fat yogurt. Low-sodium or reduced-sodium cheeses. Fats and Oils Tub margarines without trans fats. Light or reduced-fat mayonnaise and salad dressings (reduced sodium). Avocado. Safflower, olive, or canola oils. Natural peanut or almond butter. Other Unsalted popcorn and pretzels.  The items listed above may not be a complete list of recommended foods or beverages. Contact your dietitian for more options. WHAT FOODS ARE NOT RECOMMENDED? Grains White bread. White pasta. White rice. Refined cornbread. Bagels and croissants. Crackers that contain trans  fat. Vegetables Creamed or fried vegetables. Vegetables in a cheese sauce. Regular canned vegetables. Regular canned tomato sauce and paste. Regular tomato and vegetable juices. Fruits Dried fruits. Canned fruit in light or heavy syrup. Fruit juice. Meat and Other Protein Products Fatty cuts of meat. Ribs, chicken wings, bacon, sausage, bologna, salami, chitterlings, fatback, hot dogs, bratwurst, and packaged luncheon meats. Salted nuts and seeds. Canned beans with salt. Dairy Whole or 2% milk, cream, half-and-half, and cream cheese. Whole-fat or sweetened yogurt. Full-fat cheeses or blue cheese. Nondairy creamers and whipped toppings. Processed cheese, cheese spreads, or cheese curds. Condiments Onion and garlic salt, seasoned salt, table salt, and sea salt. Canned and packaged gravies. Worcestershire sauce. Tartar sauce. Barbecue sauce. Teriyaki sauce. Soy sauce, including reduced sodium. Steak sauce. Fish sauce. Oyster sauce. Cocktail sauce. Horseradish. Ketchup and mustard. Meat flavorings and tenderizers. Bouillon cubes. Hot sauce. Tabasco sauce. Marinades. Taco seasonings. Relishes. Fats and Oils Butter, stick margarine, lard, shortening, ghee, and bacon fat. Coconut, palm kernel, or palm oils. Regular salad dressings. Other Pickles and olives. Salted popcorn and pretzels. The items listed above may not be a complete list of foods and beverages to avoid. Contact your dietitian for more information. WHERE CAN I FIND MORE INFORMATION? National Heart, Lung, and Blood Institute: travelstabloid.com Document Released: 04/30/2011 Document Revised: 09/25/2013 Document Reviewed: 03/15/2013 [facility] Patient Information 2015 [place], [place]. This information is not intended to replace advice given to you by your health care provider. Make sure you discuss any questions you have with your health care provider.   Tennis Elbow Tennis elbow (lateral epicondylitis) is  inflammation of the outer tendons of your forearm close to your elbow. Your tendons attach your muscles to your bones. The outer tendons of your forearm are used to extend your wrist, and they attach on the outside part of your elbow. Tennis elbow is often found in people who play tennis, but anyone may get the condition from repeatedly extending the wrist or turning the forearm. What are the causes? This condition is caused by repeatedly extending your wrist and using your hands. It can result from sports or work that requires repetitive forearm movements. Tennis elbow may also be caused by an injury. What increases the risk? You have a higher risk of developing tennis elbow if you play tennis or another racquet sport. You also have a higher risk if you frequently use your hands for work. This condition is also more likely to develop in:  Musicians.  Carpenters, painters, and plumbers.  Cooks.  Cashiers.  People who work in [employer].  Architect workers.  Butchers.  People who use computers. What are the signs or symptoms? Symptoms of this condition include:  Pain and tenderness in your forearm and the outer part of your elbow. You may only feel the pain when you use your arm, or you may feel it even when you are not using your arm.  A burning feeling that runs from your elbow through your arm.  Weak grip in your hands. How is this diagnosed? This condition may be diagnosed by medical history and physical exam. You may also have other tests, including:  X-rays.  MRI. How is this treated? Your health care provider will recommend lifestyle adjustments, such as  resting and icing your arm. Treatment may also include:  Medicines for inflammation. This may include shots of cortisone if your pain continues.  Physical therapy. This may include massage or exercises.  An elbow brace. Surgery may eventually be recommended if your pain does not go away with treatment. Follow these  instructions at home: Activity  Rest your elbow and wrist as directed by your health care provider. Try to avoid any activities that caused the problem until your health care provider says that you can do them again.  If a physical therapist teaches you exercises, do all of them as directed.  If you lift an object, lift it with your palm facing upward. This lowers the stress on your elbow. Lifestyle  If your tennis elbow is caused by sports, check your equipment and make sure that:  You are using it correctly.  It is the best fit for you.  If your tennis elbow is caused by work, take breaks frequently, if you are able. Talk with your manager about how to best perform tasks in a way that is safe.  If your tennis elbow is caused by computer use, talk with your manager about any changes that can be made to your work environment. General instructions  If directed, apply ice to the painful area:  Put ice in a plastic bag.  Place a towel between your skin and the bag.  Leave the ice on for 20 minutes, 2-3 times per day.  Take medicines only as directed by your health care provider.  If you were given a brace, wear it as directed by your health care provider.  Keep all follow-up visits as directed by your health care provider. This is important. Contact a health care provider if:  Your pain does not get better with treatment.  Your pain gets worse.  You have numbness or weakness in your forearm, hand, or fingers. This information is not intended to replace advice given to you by your health care provider. Make sure you discuss any questions you have with your health care provider. Document Released: 05/11/2005 Document Revised: 01/09/2016 Document Reviewed: 05/07/2014 Elsevier Interactive Patient Education  2017 [employer].

## 2016-04-23 ENCOUNTER — Ambulatory Visit (INDEPENDENT_AMBULATORY_CARE_PROVIDER_SITE_OTHER): Payer: Medicare Other | Admitting: Cardiology

## 2016-04-23 ENCOUNTER — Encounter: Payer: Self-pay | Admitting: *Deleted

## 2016-04-23 ENCOUNTER — Encounter: Payer: Self-pay | Admitting: Cardiology

## 2016-04-23 ENCOUNTER — Other Ambulatory Visit: Payer: Self-pay | Admitting: Cardiology

## 2016-04-23 VITALS — BP 140/64 | HR 55 | Ht 71.5 in | Wt 259.8 lb

## 2016-04-23 DIAGNOSIS — I481 Persistent atrial fibrillation: Secondary | ICD-10-CM | POA: Diagnosis not present

## 2016-04-23 DIAGNOSIS — Z01812 Encounter for preprocedural laboratory examination: Secondary | ICD-10-CM | POA: Diagnosis not present

## 2016-04-23 DIAGNOSIS — I4819 Other persistent atrial fibrillation: Secondary | ICD-10-CM

## 2016-04-23 DIAGNOSIS — I209 Angina pectoris, unspecified: Secondary | ICD-10-CM | POA: Diagnosis not present

## 2016-04-23 MED ORDER — ATENOLOL 25 MG PO TABS: 25.0000 mg | ORAL_TABLET | Freq: Every day | ORAL | 3 refills | Status: DC

## 2016-04-23 NOTE — Progress Notes (Signed)
Electrophysiology Office Note   Date:  04/23/2016   ID:  [name], [name] 23-Dec-1950, MRN [id number]  PCP:  Alesia Richards, MD  Cardiologist:  Radford Pax Primary Electrophysiologist:  Lakelyn Straus [name], MD    Chief Complaint  Patient presents with  . Follow-up    PAF     History of Present Illness: Christopher Wade is a 65 y.o. male who presents today for electrophysiology evaluation.   He has a history of atrial fibrillation s/p DCCV 03/2015 to NSR but before discharge he went back into afib. He also has a history of HTN, dyslipidemia, DM, ASCAD cath showing 50% LM, 40% distal LAD, 70% ost to prox LCs, 40% mid LAD and 95% mid to dist RCA s/p PCI with DES x 2 and stage PCI of the LCx/OM. At last OV he was loaded with Amio and then he was set up to have DCCV which he successfully had done in July 2017. Repeat cardioversion 03/16/16. He is felt well post cardioversion other than episodes of the fatigue when his hear rate has been low. He says that his heart rate is gotten down into the 40s.  Today, he denies symptoms of palpitations, chest pain, orthopnea, PND, lower extremity edema, claudication, dizziness, presyncope, syncope, bleeding, or neurologic sequela. The patient is tolerating medications without difficulties and is otherwise without complaint today.    Past Medical History:  Diagnosis Date  . Allergy   . Arthritis    lumbar  . CAD (coronary artery disease), native coronary artery 02/12/2016   cath showing 50% LM, 40% distal LAD, 70% ost to prox LCs, 40% mid LAD and 95% mid to dist RCA s/p PCI with DES x 2 and stage PCI of the LCx/OM.  [name] Diabetes mellitus without complication ([place])   . Hyperlipidemia   . Hypertension   . OSA (obstructive sleep apnea)    noncompliant with CPAP  . PAF (paroxysmal atrial fibrillation) ([place])    s/p DCCV 03/2015 with reversion back to atrial fibrillation, loaded with Amio and s/p DCCV to NSR 11/2015  . Thyroid disease   . Vitamin D  deficiency    Past Surgical History:  Procedure Laterality Date  . CARDIAC CATHETERIZATION N/A 10/11/2015   Procedure: Left Heart Cath and Coronary Angiography;  Surgeon: [name], MD;  Location: [facility];  Service: Cardiovascular;  Laterality: N/A;  . CARDIAC CATHETERIZATION N/A 10/11/2015   Procedure: Coronary Stent Intervention;  Surgeon: [name], MD;  Location: [facility];  Service: Cardiovascular;  Laterality: N/A;  . CARDIAC CATHETERIZATION N/A 10/11/2015   Procedure: Intravascular Pressure Wire/FFR Study;  Surgeon: [name], MD;  Location: [facility];  Service: Cardiovascular;  Laterality: N/A;  . CARDIAC CATHETERIZATION N/A 10/14/2015   Procedure: Coronary Stent Intervention;  Surgeon: [name], MD;  Location: [facility];  Service: Cardiovascular;  Laterality: N/A;  . CARDIOVERSION N/A 04/05/2015   Procedure: CARDIOVERSION;  Surgeon: [name], MD;  Location: [place];  Service: Cardiovascular;  Laterality: N/A;  . CARDIOVERSION N/A 12/09/2015   Procedure: CARDIOVERSION;  Surgeon: [name], MD;  Location: [facility];  Service: Cardiovascular;  Laterality: N/A;  . CARDIOVERSION N/A 03/16/2016   Procedure: CARDIOVERSION;  Surgeon: [name], MD;  Location: [facility];  Service: Cardiovascular;  Laterality: N/A;  . JOINT REPLACEMENT Bilateral 2001   Hips     Current Outpatient Prescriptions  Medication Sig Dispense Refill  . allopurinol (ZYLOPRIM) 300  MG tablet Take 150 mg by mouth daily.    [name] amiodarone (PACERONE) 200 MG tablet Take 300 mg by mouth daily. Take one and a half tablets (300 mg) daily    . amLODipine (NORVASC) 5 MG tablet Take 1 tablet (5 mg total) by mouth daily. 30 tablet 11  . atenolol (TENORMIN) 100 MG tablet take 1/2 tablet by mouth twice a day 90 tablet 3  . atorvastatin (LIPITOR) 80 MG tablet take 1 tablet by mouth once daily AT 6PM 90 tablet 0  . B Complex Vitamins (B COMPLEX  PO) Take 1 tablet by mouth daily.    . cholecalciferol (VITAMIN D) 1000 UNITS tablet Take 1,000 Units by mouth daily.    . clopidogrel (PLAVIX) 75 MG tablet Take 1 tablet (75 mg total) by mouth daily with breakfast. 30 tablet 11  . colchicine 0.6 MG tablet take 1 tablet by mouth once daily TO PREVENT GOUT 90 tablet 0  . HYDROcodone-acetaminophen (NORCO/VICODIN) 5-325 MG tablet Take 1 tablet by mouth every 6 (six) hours as needed for moderate pain. 60 tablet 0  . levothyroxine (SYNTHROID, LEVOTHROID) 75 MCG tablet Take 1 tablet (75 mcg total) by mouth daily before breakfast. 30 tablet 1  . loratadine (CLARITIN) 10 MG tablet Take 10 mg by mouth daily.    . magnesium gluconate (MAGONATE) 500 MG tablet Take 500 mg by mouth 2 (two) times daily.     . metFORMIN (GLUCOPHAGE) 500 MG tablet take 1 tablet by mouth twice a day with food 60 tablet 3  . nystatin cream (MYCOSTATIN) Apply 1 application topically 2 (two) times daily as needed (wound care). 30 g 1  . Omega-3 Fatty Acids (FISH OIL) 1200 MG CAPS Take 1,200 mg by mouth daily.    . rivaroxaban (XARELTO) 20 MG TABS tablet Take 1 tablet (20 mg total) by mouth daily with supper. 30 tablet 4  . valsartan (DIOVAN) 320 MG tablet take 1 tablet by mouth once daily 90 tablet 0   No current facility-administered medications for this visit.     Allergies:   Bee venom   Social History:  The patient  reports that he has never smoked. He has never used smokeless tobacco. He reports that he does not drink alcohol or use drugs.   Family History:  The patient's family history includes Colon cancer (age of onset: 34) in his sister; Lung cancer (age of onset: 64) in his father; Lung cancer (age of onset: 65) in his mother.    ROS:  Please see the history of present illness.   Otherwise, review of systems is positive for fatigue.   All other systems are reviewed and negative.    PHYSICAL EXAM: VS:  There were no vitals taken for this visit. , BMI There is no  height or weight on file to calculate BMI. GEN: Well nourished, well developed, in no acute distress  HEENT: normal  Neck: no JVD, carotid bruits, or masses Cardiac: RRR; no murmurs, rubs, or gallops,no edema  Respiratory:  clear to auscultation bilaterally, normal work of breathing GI: soft, nontender, nondistended, + BS MS: no deformity or atrophy  Skin: warm and dry Neuro:  Strength and sensation are intact Psych: euthymic mood, full affect  EKG:  EKG is ordered today. Personal review of the ekg ordered shows sinus rhythm, prolonged QTc 493, rate 55  Recent Labs: 12/10/2015: Hemoglobin 15.2; Platelets 228 02/05/2016: Magnesium 1.7 04/13/2016: ALT 19; BUN 23; Creat 1.24; Potassium 3.8; Sodium 143; TSH 11.99  Lipid Panel     Component Value Date/Time   CHOL 90 (L) 12/03/2015 0950   TRIG 99 12/03/2015 0950   HDL 39 (L) 12/03/2015 0950   CHOLHDL 2.3 12/03/2015 0950   VLDL 20 12/03/2015 0950   LDLCALC 31 12/03/2015 0950     Wt Readings from Last 3 Encounters:  04/20/16 260 lb 3.2 oz (118 kg)  02/25/16 255 lb 6.4 oz (115.8 kg)  02/12/16 250 lb 12.8 oz (113.8 kg)      Other studies Reviewed: Additional studies/ records that were reviewed today include: TTE 03/2015, Cath 10/14/15  Review of the above records today demonstrates:  - Left ventricle: The cavity size was normal. Wall thickness was   increased in a pattern of mild LVH. Systolic function was normal.   The estimated ejection fraction was in the range of 60% to 65%.   Indeterminant diastolic function (atrial fibrillation). Wall   motion was normal; there were no regional wall motion   abnormalities. - Aortic valve: There was no stenosis. - Aorta: Borderline dilated aortic root. - Mitral valve: There was trivial regurgitation. - Left atrium: The atrium was mildly dilated. - Right ventricle: The cavity size was moderately dilated. Systolic   function was normal. - Right atrium: The atrium was mildly dilated. -  Tricuspid valve: Peak RV-RA gradient (S): 32 mm Hg. - Pulmonary arteries: PA peak pressure: 35 mm Hg (S). - Inferior vena cava: The vessel was normal in size. The   respirophasic diameter changes were in the normal range (>= 50%),   consistent with normal central venous pressure.   Ost 1st Mrg to 1st Mrg lesion to mid circumflex, 95% stenosed. Post intervention with a 2.75 x 32 Synergy, postdilated to 3.5 mm, there is a 0% residual stenosis.  The Synergy stent extends from the OM back into the native circumflex, jailing the mid circumflex. Postprocedure, there is TIMI-3 flow in the circumflex with only a moderate stenosis at the jailed portion.   Cath 10/11/15  LM lesion, 50% stenosed.  Dist LAD lesion, 40% stenosed.  Ost Cx to Prox Cx lesion, 70% stenosed.  1st Mrg lesion, 70% stenosed.  Mid LAD lesion, 40% stenosed.  Mid RCA to Dist RCA lesion, 95% stenosed. Post intervention, there is a 0% residual stenosis. The lesion was not previously treated.   ASSESSMENT AND PLAN:  1.  Persistent atrial fibrillation/flutter: on amiodarone and Xarelto. Had cardioversion 03/16/16. Remains in sinus rhythm. Discussed with him options including ablation. Risks of ablation were discussed. Risks include bleeding, tamponade, heart block, stroke, and damage to surrounding organs. He understands these risks and has agreed to the procedure.  This patients CHA2DS2-VASc Score and unadjusted Ischemic Stroke Rate (% per year) is equal to 4.8 % stroke rate/year from a score of 4  Above score calculated as 1 point each if present [CHF, HTN, DM, Vascular=MI/PAD/Aortic Plaque, Age if 65-74, or Male] Above score calculated as 2 points each if present [Age > 75, or Stroke/TIA/TE]  2. Hypertension: Elevated diastolics today. Blood pressures have been well controlled in the past. No changes made today.  3. Hyperlipidemia:Continue statin  4. CAD: s/p PCI to Cx and RCA.No current chest pain  5. OSA:  Currently noncompliant with CPAP. His primary physician has given him a phone number to potentially help out with treatment of his sleep apnea. I did discuss with him the relationship between sleep apnea and atrial fibrillation and the importance of CPAP therapy.     Current medicines are  reviewed at length with the patient today.   The patient does not have concerns regarding his medicines.  The following changes were made today:  none  Labs/ tests ordered today include:  No orders of the defined types were placed in this encounter.    Disposition:   FU with Agustina Witzke 3 months  Signed, Carrina Schoenberger [name], MD  04/23/2016 3:57 PM     [facility] [address] [place] [address] [phone] (office) [phone] (fax)

## 2016-04-23 NOTE — Patient Instructions (Addendum)
Medication Instructions:   Your physician has recommended you make the following change in your medication:   1) DECREASE Atenolol to 25 mg once daily  --- If you need a refill on your cardiac medications before your next appointment, please call your pharmacy. ---  Labwork:  None ordered  Testing/Procedures:  None ordered  Follow-Up:  Your physician recommends that you schedule a follow-up appointment in: between 05/11/16 - 06/10/16 with Dr. Curt Bears for H&P and pre procedure labs.  Thank you for choosing [facility]!!   Trinidad Curet, RN [phone]   Any Other Special Instructions Will Be Listed Below (If Applicable).   Cardiac Ablation Cardiac ablation is a procedure to stop some heart tissue from causing problems. The heart has many electrical connections. Sometimes these connections cause the heart to beat very fast or irregularly. Removing some of the problem areas can improve heart rhythm or make it normal. Ablation is done for people who:  Have Wolff-Parkinson-White syndrome.  Have other fast heart rhythms (tachycardia).  Have taken medicines for an abnormal heart rhythm (arrhythmia) and the medicines had:  No success.  Side effects.  May have a type of heartbeat that could cause death. What happens before the procedure?  Follow instructions from your doctor about eating and drinking before the procedure.  Take your medicines as told by your doctor. Take them at regular times with water unless told differently by your doctor.  If you are taking diabetes medicine, ask your doctor how to take it. Ask if there are any special instructions you should follow. Your doctor may change how much insulin you take the day of the procedure. What happens during the procedure?  A special type of X-ray will be used. The X-ray helps your doctor see images of your heart during the procedure.  A small cut (incision) will be made in your neck or groin.  An IV tube  will be started before the procedure begins.  You will be given a numbing medicine (anesthetic) or a medicine to help you relax (sedative).  The skin on your neck or groin will be numbed.  A needle will be put into a large vein in your neck or groin.  A thin, flexible tube (catheter) will be put in to reach your heart.  A dye will be put in the tube. The dye will show up on X-rays. It will help your doctor see the area of the heart that needs treatment.  When the heart tissue that is causing problems is found, the tip of the tube will send an electrical current to it. This will stop it from causing problems.  The tube will be taken out.  Pressure will be put on the area where the tube was. This will keep it from bleeding. A bandage will be placed over the area. What happens after the procedure?  You will be taken to a recovery area. Your blood pressure, heart rate, and breathing will be watched. The area where the tube was will also be watched for bleeding.  You will need to lie still for 4-6 hours. This keeps the area where the tube was from bleeding. This information is not intended to replace advice given to you by your health care provider. Make sure you discuss any questions you have with your health care provider. Document Released: 01/11/2013 Document Revised: 10/17/2015 Document Reviewed: 10/06/2012 Elsevier Interactive Patient Education  2017 [employer].

## 2016-04-30 ENCOUNTER — Other Ambulatory Visit: Payer: Self-pay | Admitting: *Deleted

## 2016-04-30 DIAGNOSIS — I480 Paroxysmal atrial fibrillation: Secondary | ICD-10-CM

## 2016-05-04 ENCOUNTER — Encounter: Payer: Self-pay | Admitting: Cardiology

## 2016-05-06 ENCOUNTER — Encounter (HOSPITAL_COMMUNITY): Payer: Self-pay | Admitting: *Deleted

## 2016-05-06 ENCOUNTER — Emergency Department (HOSPITAL_COMMUNITY)
Admission: EM | Admit: 2016-05-06 | Discharge: 2016-05-06 | Disposition: A | Discharge status: Home / Self Care | Payer: Medicare Other | Attending: Emergency Medicine | Admitting: Emergency Medicine

## 2016-05-06 DIAGNOSIS — Z96643 Presence of artificial hip joint, bilateral: Secondary | ICD-10-CM | POA: Diagnosis not present

## 2016-05-06 DIAGNOSIS — Z7901 Long term (current) use of anticoagulants: Secondary | ICD-10-CM | POA: Diagnosis not present

## 2016-05-06 DIAGNOSIS — N182 Chronic kidney disease, stage 2 (mild): Secondary | ICD-10-CM | POA: Diagnosis not present

## 2016-05-06 DIAGNOSIS — S01512A Laceration without foreign body of oral cavity, initial encounter: Secondary | ICD-10-CM | POA: Insufficient documentation

## 2016-05-06 DIAGNOSIS — Y939 Activity, unspecified: Secondary | ICD-10-CM | POA: Insufficient documentation

## 2016-05-06 DIAGNOSIS — Z7984 Long term (current) use of oral hypoglycemic drugs: Secondary | ICD-10-CM | POA: Diagnosis not present

## 2016-05-06 DIAGNOSIS — Y929 Unspecified place or not applicable: Secondary | ICD-10-CM | POA: Insufficient documentation

## 2016-05-06 DIAGNOSIS — I129 Hypertensive chronic kidney disease with stage 1 through stage 4 chronic kidney disease, or unspecified chronic kidney disease: Secondary | ICD-10-CM | POA: Diagnosis not present

## 2016-05-06 DIAGNOSIS — I2510 Atherosclerotic heart disease of native coronary artery without angina pectoris: Secondary | ICD-10-CM | POA: Insufficient documentation

## 2016-05-06 DIAGNOSIS — X58XXXA Exposure to other specified factors, initial encounter: Secondary | ICD-10-CM | POA: Insufficient documentation

## 2016-05-06 DIAGNOSIS — Y999 Unspecified external cause status: Secondary | ICD-10-CM | POA: Insufficient documentation

## 2016-05-06 DIAGNOSIS — Z7902 Long term (current) use of antithrombotics/antiplatelets: Secondary | ICD-10-CM | POA: Diagnosis not present

## 2016-05-06 DIAGNOSIS — S0993XA Unspecified injury of face, initial encounter: Secondary | ICD-10-CM | POA: Diagnosis present

## 2016-05-06 DIAGNOSIS — E1122 Type 2 diabetes mellitus with diabetic chronic kidney disease: Secondary | ICD-10-CM | POA: Diagnosis not present

## 2016-05-06 LAB — CBC WITH DIFFERENTIAL/PLATELET
Basophils Absolute: 0 10*3/uL (ref 0.0–0.1)
Basophils Relative: 0 %
Eosinophils Absolute: 0.2 10*3/uL (ref 0.0–0.7)
Eosinophils Relative: 2 %
HCT: 41.9 % (ref 39.0–52.0)
Hemoglobin: 14.5 g/dL (ref 13.0–17.0)
Lymphocytes Relative: 15 %
Lymphs Abs: 1.4 10*3/uL (ref 0.7–4.0)
MCH: 33.6 pg (ref 26.0–34.0)
MCHC: 34.6 g/dL (ref 30.0–36.0)
MCV: 97.2 fL (ref 78.0–100.0)
Monocytes Absolute: 0.9 10*3/uL (ref 0.1–1.0)
Monocytes Relative: 9 %
Neutro Abs: 7 10*3/uL (ref 1.7–7.7)
Neutrophils Relative %: 74 %
Platelets: 233 10*3/uL (ref 150–400)
RBC: 4.31 MIL/uL (ref 4.22–5.81)
RDW: 14.7 % (ref 11.5–15.5)
WBC: 9.5 10*3/uL (ref 4.0–10.5)

## 2016-05-06 LAB — PROTIME-INR
INR: 2.12
Prothrombin Time: 24.1 seconds — ABNORMAL HIGH (ref 11.4–15.2)

## 2016-05-06 MED ORDER — LIDOCAINE-EPINEPHRINE 2 %-1:200000 IJ SOLN
10.0000 mL | Freq: Once | INTRAMUSCULAR | Status: AC
Start: 2016-05-06 — End: 2016-05-06
  Administered 2016-05-06: 10 mL
  Filled 2016-05-06: qty 20

## 2016-05-06 NOTE — ED Notes (Signed)
ED Provider at bedside. 

## 2016-05-06 NOTE — Discharge Instructions (Signed)
Please read attached information. If you experience any new or worsening signs or symptoms please return to the emergency room for evaluation. Please follow-up with your primary care provider or specialist as discussed.  °

## 2016-05-06 NOTE — ED Triage Notes (Signed)
Pt reports onset today of spitting up blood. Moderate amount of blood noted in cup at triage. Reports possibly biting/injuring tongue. Pt is on plavix.

## 2016-05-06 NOTE — ED Provider Notes (Signed)
[facility] DEPT Provider Note   CSN: [id number] Arrival date & time: 05/06/16  0754     History   Chief Complaint Chief Complaint  Patient presents with  . tongue injury    HPI Christopher Wade is a 65 y.o. male.  HPI   [name] presents with tongue injury. Pt reports he woke up at 1am with bleeding from his mouth. He looked in the mirror and noticed a very small area of bleeding for the right side of his tongue. He reports very minor cough yesterday and wonders if he bite his tongue in his sleep. He states the bleeding has continued since. Pt reports a significant cardiac history and is currently on Xerelto.   Past Medical History:  Diagnosis Date  . Allergy   . Arthritis    lumbar  . CAD (coronary artery disease), native coronary artery 02/12/2016   cath showing 50% LM, 40% distal LAD, 70% ost to prox LCs, 40% mid LAD and 95% mid to dist RCA s/p PCI with DES x 2 and stage PCI of the LCx/OM.  [name] Diabetes mellitus without complication ([place])   . Hyperlipidemia   . Hypertension   . OSA (obstructive sleep apnea)    noncompliant with CPAP  . PAF (paroxysmal atrial fibrillation) ([place])    s/p DCCV 03/2015 with reversion back to atrial fibrillation, loaded with Amio and s/p DCCV to NSR 11/2015  . Thyroid disease   . Vitamin D deficiency     Patient Active Problem List   Diagnosis Date Noted  . Lateral epicondylitis of left elbow 04/20/2016  . Persistent atrial fibrillation ([place])   . CAD (coronary artery disease), native coronary artery 02/12/2016  . Dyspnea and respiratory abnormality 01/13/2016  . Abnormal PFT 01/13/2016  . Abnormal nuclear stress test 10/11/2015  . Encounter for Medicare annual wellness exam 05/16/2015  . PAF (paroxysmal atrial fibrillation) ([place])   . OSA (obstructive sleep apnea)   . Gout 11/12/2014  . Essential hypertension 11/12/2014  . On amiodarone therapy 11/12/2014  . Encounter for long-term (current) use of medications 07/31/2013  . Morbid  obesity ([place]) 07/31/2013  . Hyperlipidemia   . DM type 2 causing CKD stage 2 ([place])   . Thyroid disease   . Vitamin D deficiency     Past Surgical History:  Procedure Laterality Date  . CARDIAC CATHETERIZATION N/A 10/11/2015   Procedure: Left Heart Cath and Coronary Angiography;  Surgeon: [name], MD;  Location: [facility];  Service: Cardiovascular;  Laterality: N/A;  . CARDIAC CATHETERIZATION N/A 10/11/2015   Procedure: Coronary Stent Intervention;  Surgeon: [name], MD;  Location: [facility];  Service: Cardiovascular;  Laterality: N/A;  . CARDIAC CATHETERIZATION N/A 10/11/2015   Procedure: Intravascular Pressure Wire/FFR Study;  Surgeon: [name], MD;  Location: [facility];  Service: Cardiovascular;  Laterality: N/A;  . CARDIAC CATHETERIZATION N/A 10/14/2015   Procedure: Coronary Stent Intervention;  Surgeon: [name], MD;  Location: [facility];  Service: Cardiovascular;  Laterality: N/A;  . CARDIOVERSION N/A 04/05/2015   Procedure: CARDIOVERSION;  Surgeon: [name], MD;  Location: [facility];  Service: Cardiovascular;  Laterality: N/A;  . CARDIOVERSION N/A 12/09/2015   Procedure: CARDIOVERSION;  Surgeon: [name], MD;  [facility];  Service: Cardiovascular;  Laterality: N/A;  . CARDIOVERSION N/A 03/16/2016   Procedure: CARDIOVERSION;  Surgeon: [name], MD;  Location: [facility];  Service: Cardiovascular;  Laterality: N/A;  .  JOINT REPLACEMENT Bilateral 2001   Hips     Home Medications    Prior to Admission medications   Medication Sig Start Date End Date Taking? Authorizing Provider  allopurinol (ZYLOPRIM) 300 MG tablet Take 150 mg by mouth daily.    Historical Provider, MD  amiodarone (PACERONE) 200 MG tablet Take 300 mg by mouth daily. Take one and a half tablets (300 mg) daily    Historical Provider, MD  amLODipine (NORVASC) 5 MG tablet Take 1 tablet (5 mg total) by mouth daily.  11/05/15   [name], MD  atenolol (TENORMIN) 25 MG tablet Take 1 tablet (25 mg total) by mouth daily. 04/23/16   Will [name], MD  atorvastatin (LIPITOR) 80 MG tablet take 1 tablet by mouth once daily AT 6PM 02/28/16   Vicie Mutters, PA-C  B Complex Vitamins (B COMPLEX PO) Take 1 tablet by mouth daily.    Historical Provider, MD  cholecalciferol (VITAMIN D) 1000 UNITS tablet Take 1,000 Units by mouth daily.    Historical Provider, MD  clopidogrel (PLAVIX) 75 MG tablet Take 1 tablet (75 mg total) by mouth daily with breakfast. 10/15/15   Norman Herrlich, MD  colchicine 0.6 MG tablet take 1 tablet by mouth once daily TO PREVENT GOUT 02/28/16   Vicie Mutters, PA-C  HYDROcodone-acetaminophen (NORCO/VICODIN) 5-325 MG tablet Take 1 tablet by mouth every 6 (six) hours as needed for moderate pain. 04/20/16   Vicie Mutters, PA-C  levothyroxine (SYNTHROID, LEVOTHROID) 75 MCG tablet Take 1 tablet (75 mcg total) by mouth daily before breakfast. 04/15/16   Vicie Mutters, PA-C  loratadine (CLARITIN) 10 MG tablet Take 10 mg by mouth daily.    Historical Provider, MD  magnesium gluconate (MAGONATE) 500 MG tablet Take 500 mg by mouth 2 (two) times daily.     Historical Provider, MD  metFORMIN (GLUCOPHAGE) 500 MG tablet take 1 tablet by mouth twice a day with food 02/28/16   Vicie Mutters, PA-C  nystatin cream (MYCOSTATIN) Apply 1 application topically 2 (two) times daily as needed (wound care). 04/05/15   [name], MD  Omega-3 Fatty Acids (FISH OIL) 1200 MG CAPS Take 1,200 mg by mouth daily.    Historical Provider, MD  rivaroxaban (XARELTO) 20 MG TABS tablet Take 1 tablet (20 mg total) by mouth daily with supper. 06/25/15   Vicie Mutters, PA-C  valsartan (DIOVAN) 320 MG tablet take 1 tablet by mouth once daily 02/28/16   Vicie Mutters, PA-C    Family History Family History  Problem Relation Age of Onset  . Lung cancer Mother 34  . Lung cancer Father 16  . Colon cancer Sister 62    colon     Social History Social History  Substance Use Topics  . Smoking status: Never Smoker  . Smokeless tobacco: Never Used  . Alcohol use No     Allergies   Bee venom   Review of Systems Review of Systems  All other systems reviewed and are negative.   Physical Exam Updated Vital Signs BP 147/65 (BP Location: Right Arm)   Pulse (!) 54   Temp 98 F (36.7 C) (Oral)   Resp 20   Ht 6' (1.829 m)   Wt 115.7 kg   SpO2 96%   BMI 34.58 kg/m   Physical Exam  Constitutional: He is oriented to person, place, and time. He appears well-developed and well-nourished.  HENT:  Head: Normocephalic and atraumatic.   Small laceration to tongue- 2 mm- minimal bleeding  Eyes: Conjunctivae are normal. Pupils are equal, round, and reactive to light. Right eye exhibits no discharge. Left eye exhibits no discharge. No scleral icterus.  Neck: Normal range of motion. No JVD present. No tracheal deviation present.  Pulmonary/Chest: Effort normal. No stridor.  Neurological: He is alert and oriented to person, place, and time. Coordination normal.  Psychiatric: He has a normal mood and affect. His behavior is normal. Judgment and thought content normal.  Nursing note and vitals reviewed.   ED Treatments / Results  Labs (all labs ordered are listed, but only abnormal results are displayed) Labs Reviewed  PROTIME-INR - Abnormal; Notable for the following:       Result Value   Prothrombin Time 24.1 (*)    All other components within normal limits  CBC WITH DIFFERENTIAL/PLATELET    EKG  EKG Interpretation None       Radiology No results found.  Procedures Procedures (including critical care time)  Medications Ordered in ED Medications  lidocaine-EPINEPHrine (XYLOCAINE W/EPI) 2 %-1:200000 (PF) injection 10 mL (10 mLs Infiltration Given 05/06/16 0831)     Initial Impression / Assessment and Plan / ED Course  I have reviewed the triage vital signs and the nursing  notes.  Pertinent labs & imaging results that were available during my care of the patient were reviewed by me and considered in my medical decision making (see chart for details).  Clinical Course      Final Clinical Impressions(s) / ED Diagnoses   Final diagnoses:  Laceration of tongue, initial encounter    Labs: PT/INR, CBC  Imaging:  Consults:  Therapeutics:  Discharge Meds:   Assessment/Plan:  65 year old male presents today with tongue laceration. He is currently on Xarelto, 2 very small sutures were placed which caused hemostasis. Patient had no bleeding after evaluation here in the ED. He is discharged home with ENT follow-up if symptoms return, ED if they worsen. Patient verbalized understanding and agreement to today's plan had no further questions or concerns at time of discharge     New Prescriptions Discharge Medication List as of 05/06/2016 10:17 AM       Okey Regal, PA-C 05/06/16 1108    Charlesetta Shanks, MD 05/07/16 [phone]

## 2016-05-07 ENCOUNTER — Other Ambulatory Visit: Payer: Self-pay | Admitting: Physician Assistant

## 2016-05-08 ENCOUNTER — Encounter: Payer: Self-pay | Admitting: *Deleted

## 2016-05-21 ENCOUNTER — Ambulatory Visit (INDEPENDENT_AMBULATORY_CARE_PROVIDER_SITE_OTHER): Payer: Medicare Other | Admitting: Physician Assistant

## 2016-05-21 ENCOUNTER — Encounter: Payer: Self-pay | Admitting: Physician Assistant

## 2016-05-21 VITALS — BP 140/72 | HR 66 | Temp 98.1°F | Resp 16 | Ht 71.0 in | Wt 263.4 lb

## 2016-05-21 DIAGNOSIS — I481 Persistent atrial fibrillation: Secondary | ICD-10-CM

## 2016-05-21 DIAGNOSIS — R6889 Other general symptoms and signs: Secondary | ICD-10-CM | POA: Diagnosis not present

## 2016-05-21 DIAGNOSIS — E079 Disorder of thyroid, unspecified: Secondary | ICD-10-CM | POA: Diagnosis not present

## 2016-05-21 DIAGNOSIS — I10 Essential (primary) hypertension: Secondary | ICD-10-CM

## 2016-05-21 DIAGNOSIS — M1A9XX0 Chronic gout, unspecified, without tophus (tophi): Secondary | ICD-10-CM | POA: Diagnosis not present

## 2016-05-21 DIAGNOSIS — N182 Chronic kidney disease, stage 2 (mild): Secondary | ICD-10-CM

## 2016-05-21 DIAGNOSIS — N138 Other obstructive and reflux uropathy: Secondary | ICD-10-CM | POA: Diagnosis not present

## 2016-05-21 DIAGNOSIS — E559 Vitamin D deficiency, unspecified: Secondary | ICD-10-CM | POA: Diagnosis not present

## 2016-05-21 DIAGNOSIS — Z0001 Encounter for general adult medical examination with abnormal findings: Secondary | ICD-10-CM

## 2016-05-21 DIAGNOSIS — Z79899 Other long term (current) drug therapy: Secondary | ICD-10-CM

## 2016-05-21 DIAGNOSIS — Z0000 Encounter for general adult medical examination without abnormal findings: Secondary | ICD-10-CM

## 2016-05-21 DIAGNOSIS — N401 Enlarged prostate with lower urinary tract symptoms: Secondary | ICD-10-CM

## 2016-05-21 DIAGNOSIS — E1122 Type 2 diabetes mellitus with diabetic chronic kidney disease: Secondary | ICD-10-CM

## 2016-05-21 DIAGNOSIS — I2510 Atherosclerotic heart disease of native coronary artery without angina pectoris: Secondary | ICD-10-CM

## 2016-05-21 DIAGNOSIS — Z23 Encounter for immunization: Secondary | ICD-10-CM | POA: Diagnosis not present

## 2016-05-21 DIAGNOSIS — Z125 Encounter for screening for malignant neoplasm of prostate: Secondary | ICD-10-CM | POA: Diagnosis not present

## 2016-05-21 DIAGNOSIS — G4733 Obstructive sleep apnea (adult) (pediatric): Secondary | ICD-10-CM | POA: Diagnosis not present

## 2016-05-21 DIAGNOSIS — E785 Hyperlipidemia, unspecified: Secondary | ICD-10-CM | POA: Diagnosis not present

## 2016-05-21 DIAGNOSIS — I4819 Other persistent atrial fibrillation: Secondary | ICD-10-CM

## 2016-05-21 LAB — CBC WITH DIFFERENTIAL/PLATELET
BASOS ABS: 0 {cells}/uL (ref 0–200)
BASOS PCT: 0 %
EOS ABS: 186 {cells}/uL (ref 15–500)
EOS PCT: 2 %
HCT: 42.3 % (ref 38.5–50.0)
Hemoglobin: 13.8 g/dL (ref 13.2–17.1)
LYMPHS PCT: 19 %
Lymphs Abs: 1767 cells/uL (ref 850–3900)
MCH: 32.7 pg (ref 27.0–33.0)
MCHC: 32.6 g/dL (ref 32.0–36.0)
MCV: 100.2 fL — AB (ref 80.0–100.0)
MONOS PCT: 5 %
MPV: 9.3 fL (ref 7.5–12.5)
Monocytes Absolute: 465 cells/uL (ref 200–950)
NEUTROS ABS: 6882 {cells}/uL (ref 1500–7800)
Neutrophils Relative %: 74 %
PLATELETS: 321 10*3/uL (ref 140–400)
RBC: 4.22 MIL/uL (ref 4.20–5.80)
RDW: 15.1 % — AB (ref 11.0–15.0)
WBC: 9.3 10*3/uL (ref 3.8–10.8)

## 2016-05-21 LAB — URINALYSIS, ROUTINE W REFLEX MICROSCOPIC
BILIRUBIN URINE: NEGATIVE
GLUCOSE, UA: NEGATIVE
HGB URINE DIPSTICK: NEGATIVE
Ketones, ur: NEGATIVE
Leukocytes, UA: NEGATIVE
Nitrite: NEGATIVE
PH: 7.5 (ref 5.0–8.0)
PROTEIN: NEGATIVE
Specific Gravity, Urine: 1.015 (ref 1.001–1.035)

## 2016-05-21 NOTE — Patient Instructions (Addendum)
Can call Dr. Toy Cookey to get evaluated for dental sleep appliance. # [phone]  OR you can try Dr. Clearence Ped in [facility] # [phone]  We will send notes. Call and get price quote on both.   Make sure you are taking your thyroid medication 60 mins before food with just water.  Do not take it within 4 hours of iron, calcium, magnesium, stomach medications like tums, zantac, prilosec for example.

## 2016-05-21 NOTE — Progress Notes (Signed)
MEDICARE VISIT and CPE  Assessment:    Essential hypertension - continue medications, DASH diet, exercise and monitor at home. Call if greater than 130/80.  - CBC with Differential/Platelet - BASIC METABOLIC PANEL WITH GFR - Hepatic function panel - Urinalysis, Routine w reflex microscopic (not at [facility]) - Microalbumin / creatinine urine ratio  Atrial fibrillation, persistent (HCC) Continue anticoagulation and cardio follow up. Going to get abalation 01/18   Type 2 diabetes mellitus with stage 2 chronic kidney disease, without long-term current use of insulin ([place]) Discussed general issues about diabetes pathophysiology and management., Educational material distributed., Suggested low cholesterol diet., Encouraged aerobic exercise., Discussed foot care., Reminded to get yearly retinal exam. Continue ARB - Hemoglobin A1c   Obesity (BMI 30-39.9) Obesity with co morbidities- long discussion about weight loss, diet, and exercise  Hyperlipidemia -continue medications, check lipids, decrease fatty foods, increase activity.  - Lipid panel  Gout without tophus, unspecified cause, unspecified chronicity, unspecified site - Uric acid  OSA (obstructive sleep apnea) Emphasized need for compliance with CPAP to help prevent Afib in the future  Thyroid disease Hypothyroidism-check TSH level, continue medications the same, reminded to take on an empty stomach 30-29mins before food.  - TSH  Vitamin D deficiency   Medication management - Magnesium  Encounter for long-term (current) use of medications   BPH without obstruction/lower urinary tract symptoms - PSA  Encounter for Medicare annual wellness exam  Over 40 minutes of exam, counseling, chart review and critical decision making was performed  Plan:   During the course of the visit the patient was educated and counseled about appropriate screening and preventive services including:    Pneumococcal vaccine  Prevnar 13    Influenza vaccine  Td vaccine  Screening electrocardiogram  Bone densitometry screening  Colorectal cancer screening  Diabetes screening  Glaucoma screening  Nutrition counseling   Advanced directives: requested   Subjective:  Christopher Wade is a 65 y.o. male who presents for Welcome to medicare visit.    His blood pressure has been controlled at home, at home 120-130 on average, very rare will be in 150's, today their BP is BP: 140/72 He does not workout but is active at work. He denies chest pain, shortness of breath, dizziness.  He has history of ASCAD s/p DES stent to LCx/OM. He was found to have Afib, follows with [name], had Truchas 03/2015, second DCCV on 10/23 and now in NSR,scheduled to have ablation with Dr. Curt Bears Jan 8th 2018,, he however denies SOB, CP, palpiations. He has OSA is non compliant with CPAP, unable to tolerate.  He is on cholesterol medication and denies myalgias. His cholesterol is at goal. The cholesterol last visit was:   Lab Results  Component Value Date   CHOL 90 (L) 12/03/2015   HDL 39 (L) 12/03/2015   LDLCALC 31 12/03/2015   TRIG 99 12/03/2015   CHOLHDL 2.3 12/03/2015    He has been working on diet and exercise for diabetes with CKd, he is not on bASA due to xarelto, he is on ACE/ARB, and denies paresthesia of the feet, polydipsia, polyuria and visual disturbances. Last A1C in the office was:  Lab Results  Component Value Date   HGBA1C 6.6 (H) 12/10/2015   Lab Results  Component Value Date   GFRNONAA 61 04/13/2016  Patient is on Vitamin D supplement.   Lab Results  Component Value Date   VD25OH 81 11/12/2014     He is on thyroid medication. His  medication was changed last visit, he was changed from 48mcg to 75 mcg   Lab Results  Component Value Date   TSH 11.99 (H) 04/13/2016  .  Patient is on allopurinol for gout and does not report a recent flare.  Lab Results  Component Value Date   LABURIC 6.2 01/01/2016   BMI is Body  mass index is 36.74 kg/m., he is working on diet and exercise. Wt Readings from Last 3 Encounters:  05/21/16 263 lb 6.4 oz (119.5 kg)  05/06/16 255 lb (115.7 kg)  04/23/16 259 lb 12.8 oz (117.8 kg)      Medication Review: Current Outpatient Prescriptions on File Prior to Visit  Medication Sig Dispense Refill  . allopurinol (ZYLOPRIM) 300 MG tablet Take 150 mg by mouth daily.    [name] amiodarone (PACERONE) 200 MG tablet Take 300 mg by mouth daily. Take one and a half tablets (300 mg) daily    . amLODipine (NORVASC) 5 MG tablet Take 1 tablet (5 mg total) by mouth daily. 30 tablet 11  . atenolol (TENORMIN) 25 MG tablet Take 1 tablet (25 mg total) by mouth daily. 90 tablet 3  . atorvastatin (LIPITOR) 80 MG tablet take 1 tablet by mouth once daily AT 6PM 90 tablet 0  . B Complex Vitamins (B COMPLEX PO) Take 1 tablet by mouth daily.    . cholecalciferol (VITAMIN D) 1000 UNITS tablet Take 1,000 Units by mouth daily.    . clopidogrel (PLAVIX) 75 MG tablet Take 1 tablet (75 mg total) by mouth daily with breakfast. 30 tablet 11  . colchicine 0.6 MG tablet take 1 tablet by mouth once daily TO PREVENT GOUT 90 tablet 0  . HYDROcodone-acetaminophen (NORCO/VICODIN) 5-325 MG tablet Take 1 tablet by mouth every 6 (six) hours as needed for moderate pain. 60 tablet 0  . levothyroxine (SYNTHROID, LEVOTHROID) 75 MCG tablet Take 1 tablet (75 mcg total) by mouth daily before breakfast. 30 tablet 1  . loratadine (CLARITIN) 10 MG tablet Take 10 mg by mouth daily.    . magnesium gluconate (MAGONATE) 500 MG tablet Take 500 mg by mouth 2 (two) times daily.     . metFORMIN (GLUCOPHAGE) 500 MG tablet take 1 tablet by mouth twice a day with food 60 tablet 3  . nystatin cream (MYCOSTATIN) Apply 1 application topically 2 (two) times daily as needed (wound care). 30 g 1  . Omega-3 Fatty Acids (FISH OIL) 1200 MG CAPS Take 1,200 mg by mouth daily.    . valsartan (DIOVAN) 320 MG tablet take 1 tablet by mouth once daily 90  tablet 0  . XARELTO 20 MG TABS tablet take 1 tablet by mouth once daily WITH SUPPER 30 tablet 4   No current facility-administered medications on file prior to visit.     Current Problems (verified) Patient Active Problem List   Diagnosis Date Noted  . Lateral epicondylitis of left elbow 04/20/2016  . Persistent atrial fibrillation ([place])   . CAD (coronary artery disease), native coronary artery 02/12/2016  . Dyspnea and respiratory abnormality 01/13/2016  . Abnormal PFT 01/13/2016  . Encounter for Medicare annual wellness exam 05/16/2015  . OSA (obstructive sleep apnea)   . Gout 11/12/2014  . Essential hypertension 11/12/2014  . On amiodarone therapy 11/12/2014  . Encounter for long-term (current) use of medications 07/31/2013  . Morbid obesity ([place]) 07/31/2013  . Hyperlipidemia   . DM type 2 causing CKD stage 2 ([place])   . Thyroid disease   . Vitamin D  deficiency     Screening Tests Immunization History  Administered Date(s) Administered  . Influenza Split 04/05/2014  . Influenza-Unspecified 02/02/2013  . Pneumococcal Conjugate-13 05/01/2014  . Pneumococcal Polysaccharide-23 05/16/2015  . Td 04/25/2010  . Varicella Zoster Immune Globulin 02/22/2014   Preventative care: Last colonoscopy: 2009, Dr. Carlean Purl Echo 03/2015, normal EF, mild LAE, moderate RV Cath 2017  Prior vaccinations: TD or Tdap: 2011 Influenza: DUE, out in the office will [facility] Pneumococcal: 2016 Prevnar13: 2015 Shingles/Zostavax: 2015  Names of Other Physician/Practitioners you currently use: 1. [facility] here for primary care 2. [facility], eye doctor, last visit 06/2015 last DEE 3.dentist, none  Patient Care Team: [name], MD as PCP - General (Internal Medicine) Gatha Mayer, MD as Consulting Physician (Gastroenterology) [name], MD as Consulting Physician (Cardiology)  Allergies Allergies  Allergen Reactions  . Bee Venom  Other (See Comments)    Leg went numb    SURGICAL HISTORY He  has a past surgical history that includes Joint replacement (Bilateral, 2001); Cardioversion (N/A, 04/05/2015); Cardiac catheterization (N/A, 10/11/2015); Cardiac catheterization (N/A, 10/11/2015); Cardiac catheterization (N/A, 10/11/2015); Cardiac catheterization (N/A, 10/14/2015); Cardioversion (N/A, 12/09/2015); and Cardioversion (N/A, 03/16/2016). FAMILY HISTORY His family history includes Colon cancer in his sister; Lung cancer in his father and mother. SOCIAL HISTORY He  reports that he has never smoked. He has never used smokeless tobacco. He reports that he does not drink alcohol or use drugs.  MEDICARE WELLNESS OBJECTIVES: Physical activity: Current Exercise Habits: The patient has a physically strenous job, but has no regular exercise apart from work. Cardiac risk factors: Cardiac Risk Factors include: advanced age (>36men, >60 women);diabetes mellitus;dyslipidemia;hypertension;male gender;sedentary lifestyle;obesity (BMI >30kg/m2) Depression/mood screen:   Depression screen [facility] 2/9 05/21/2016  Decreased Interest 0  Down, Depressed, Hopeless 0  PHQ - 2 Score 0    ADLs:  In your present state of health, do you have any difficulty performing the following activities: 05/21/2016 10/12/2015  Hearing? N N  Vision? N N  Difficulty concentrating or making decisions? N N  Walking or climbing stairs? N Y  Dressing or bathing? N N  Doing errands, shopping? N Y  Some recent data might be hidden     Cognitive Testing  Alert? Yes  Normal Appearance?Yes  Oriented to person? Yes  Place? Yes   Time? Yes  Recall of three objects?  Yes  Can perform simple calculations? Yes  Displays appropriate judgment?Yes  Can read the correct time from a watch face?Yes  EOL planning: Does Patient Have a Medical Advance Directive?: No Would patient like information on creating a medical advance directive?: Yes (ED - Information included in  AVS)  Review of Systems  Constitutional: Negative.   HENT: Negative.   Eyes: Negative.   Respiratory: Negative.   Cardiovascular: Negative.   Gastrointestinal: Negative.   Genitourinary: Negative.   Musculoskeletal: Positive for joint pain (hit hand while moving things, in brace, but improving) and myalgias. Negative for back pain, falls and neck pain.  Skin: Negative.   Neurological: Negative.   Endo/Heme/Allergies: Negative.   Psychiatric/Behavioral: Negative.      Objective:     Today's Vitals   05/21/16 1513  BP: 140/72  Pulse: 66  Resp: 16  Temp: 98.1 F (36.7 C)  SpO2: 99%  Weight: 263 lb 6.4 oz (119.5 kg)  Height: 5\' 11"  (1.803 m)  PainSc: 0-No pain   Body mass index is 36.74 kg/m.  General appearance:  alert, no distress, WD/WN, male HEENT: normocephalic, sclerae anicteric, TMs pearly, nares patent, no discharge or erythema, pharynx normal Oral cavity: MMM, no lesions Neck: supple, no lymphadenopathy, no thyromegaly, no masses Heart: NSR, normal S1, S2, no murmurs Lungs: CTA bilaterally, no wheezes, rhonchi, or rales Abdomen: +bs, soft, obese, non tender, non distended, ventral hernia, no masses, no hepatomegaly, no splenomegaly Musculoskeletal: nontender, no swelling, no obvious deformity Extremities: 1+ edema, no cyanosis, no clubbing Pulses: 2+ symmetric, upper and lower extremities, normal cap refill Neurological: alert, oriented x 3, CN2-12 intact, strength normal upper extremities and lower extremities, sensation normal throughout, DTRs 2+ throughout, no cerebellar signs, gait Antalgic Psychiatric: normal affect, behavior normal, pleasant   Diabetic Foot Exam - Simple   Simple Foot Form Diabetic Foot exam was performed with the following findings:  Yes 05/21/2016  3:47 PM  Visual Inspection No deformities, no ulcerations, no other skin breakdown bilaterally:  Yes Sensation Testing Intact to touch and monofilament testing bilaterally:  Yes Pulse  Check Posterior Tibialis and Dorsalis pulse intact bilaterally:  Yes Comments    Medicare Attestation I have personally reviewed: The patient's medical and social history Their use of alcohol, tobacco or illicit drugs Their current medications and supplements The patient's functional ability including ADLs,fall risks, home safety risks, cognitive, and hearing and visual impairment Diet and physical activities Evidence for depression or mood disorders  The patient's weight, height, BMI, and visual acuity have been recorded in the chart.  I have made referrals, counseling, and provided education to the patient based on review of the above and I have provided the patient with a written personalized care plan for preventive services.     Vicie Mutters, PA-C   05/21/2016

## 2016-05-22 LAB — MAGNESIUM: MAGNESIUM: 1.8 mg/dL (ref 1.5–2.5)

## 2016-05-22 LAB — BASIC METABOLIC PANEL WITH GFR
BUN: 15 mg/dL (ref 7–25)
CHLORIDE: 100 mmol/L (ref 98–110)
CO2: 24 mmol/L (ref 20–31)
CREATININE: 0.93 mg/dL (ref 0.70–1.25)
Calcium: 9.2 mg/dL (ref 8.6–10.3)
GFR, Est African American: >89 mL/min (ref >=60)
GFR, Est Non African American: 85 mL/min (ref >=60)
GLUCOSE: 126 mg/dL — AB (ref 65–99)
POTASSIUM: 3.8 mmol/L (ref 3.5–5.3)
Sodium: 140 mmol/L (ref 135–146)

## 2016-05-22 LAB — LIPID PANEL
CHOL/HDL RATIO: 2.4 ratio (ref <5.0)
Cholesterol: 113 mg/dL (ref <200)
HDL: 48 mg/dL (ref >40)
LDL CALC: 41 mg/dL (ref <100)
Triglycerides: 120 mg/dL (ref <150)
VLDL: 24 mg/dL (ref <30)

## 2016-05-22 LAB — MICROALBUMIN / CREATININE URINE RATIO
Creatinine, Urine: 88 mg/dL (ref 20–370)
MICROALB/CREAT RATIO: 13 ug/mg{creat} (ref <30)
Microalb, Ur: 1.1 mg/dL

## 2016-05-22 LAB — HEPATIC FUNCTION PANEL
ALBUMIN: 4.2 g/dL (ref 3.6–5.1)
ALK PHOS: 83 U/L (ref 40–115)
ALT: 28 U/L (ref 9–46)
AST: 26 U/L (ref 10–35)
BILIRUBIN TOTAL: 0.5 mg/dL (ref 0.2–1.2)
Bilirubin, Direct: 0.1 mg/dL (ref <=0.2)
Indirect Bilirubin: 0.4 mg/dL (ref 0.2–1.2)
TOTAL PROTEIN: 7.5 g/dL (ref 6.1–8.1)

## 2016-05-22 LAB — URIC ACID: Uric Acid, Serum: 4 mg/dL (ref 4.0–8.0)

## 2016-05-22 LAB — HEMOGLOBIN A1C
HEMOGLOBIN A1C: 6.6 % — AB (ref <5.7)
MEAN PLASMA GLUCOSE: 143 mg/dL

## 2016-05-22 LAB — PSA: PSA: 0.6 ng/mL (ref <=4.0)

## 2016-05-22 LAB — TSH: TSH: 8.79 m[IU]/L — AB (ref 0.40–4.50)

## 2016-05-22 NOTE — Progress Notes (Signed)
Pt aware of lab results & voiced understanding of those results. Pt was transferred to front office recheck nurse visit 6 weeks.

## 2016-06-01 ENCOUNTER — Ambulatory Visit (INDEPENDENT_AMBULATORY_CARE_PROVIDER_SITE_OTHER): Payer: Medicare Other | Admitting: Cardiology

## 2016-06-01 ENCOUNTER — Encounter: Payer: Self-pay | Admitting: Cardiology

## 2016-06-01 VITALS — BP 150/74 | HR 64 | Ht 71.0 in | Wt 255.8 lb

## 2016-06-01 DIAGNOSIS — Z01812 Encounter for preprocedural laboratory examination: Secondary | ICD-10-CM

## 2016-06-01 DIAGNOSIS — I481 Persistent atrial fibrillation: Secondary | ICD-10-CM | POA: Diagnosis not present

## 2016-06-01 DIAGNOSIS — I4819 Other persistent atrial fibrillation: Secondary | ICD-10-CM

## 2016-06-01 NOTE — Addendum Note (Signed)
Addended by: Stanton Kidney on: 06/01/2016 12:39 PM   Modules accepted: Orders

## 2016-06-01 NOTE — Progress Notes (Signed)
Electrophysiology Office Note   Date:  06/01/2016   ID:  [name], DOB 03/28/1951, MRN [id number]  PCP:  Alesia Richards, MD  Cardiologist:  Radford Pax Primary Electrophysiologist:  Evin Loiseau [name], MD    Chief Complaint  Patient presents with  . Follow-up    PAF/H&P and pre procedure labs     History of Present Illness: Christopher Wade is a 66 y.o. male who presents today for electrophysiology evaluation.   He has a history of atrial fibrillation s/p DCCV 03/2015 to NSR but before discharge he went back into afib. He also has a history of HTN, dyslipidemia, DM, ASCAD cath showing 50% LM, 40% distal LAD, 70% ost to prox LCs, 40% mid LAD and 95% mid to dist RCA s/p PCI with DES x 2 and stage PCI of the LCx/OM. At last OV he was loaded with Amio and then he was set up to have DCCV which he successfully had done in July 2017. Repeat cardioversion 03/16/16.   Today, he denies symptoms of palpitations, chest pain, orthopnea, PND, lower extremity edema, claudication, dizziness, presyncope, syncope, bleeding, or neurologic sequela. The patient is tolerating medications without difficulties and is otherwise without complaint today. He has had a cough that he feels is a viral illness since last Saturday. He has not had fevers. He otherwise has been feeling well.   Past Medical History:  Diagnosis Date  . Allergy   . Arthritis    lumbar  . CAD (coronary artery disease), native coronary artery 02/12/2016   cath showing 50% LM, 40% distal LAD, 70% ost to prox LCs, 40% mid LAD and 95% mid to dist RCA s/p PCI with DES x 2 and stage PCI of the LCx/OM.  [name] Diabetes mellitus without complication ([place])   . Hyperlipidemia   . Hypertension   . OSA (obstructive sleep apnea)    noncompliant with CPAP  . PAF (paroxysmal atrial fibrillation) ([name])    s/p DCCV 03/2015 with reversion back to atrial fibrillation, loaded with Amio and s/p DCCV to NSR 11/2015  . Thyroid disease   . Vitamin D  deficiency    Past Surgical History:  Procedure Laterality Date  . CARDIAC CATHETERIZATION N/A 10/11/2015   Procedure: Left Heart Cath and Coronary Angiography;  Surgeon: [name], MD;  Location: [place] [facility];  Service: Cardiovascular;  Laterality: N/A;  . CARDIAC CATHETERIZATION N/A 10/11/2015   Procedure: Coronary Stent Intervention;  Surgeon: [name], MD;  Location: [place] [facility];  Service: Cardiovascular;  Laterality: N/A;  . CARDIAC CATHETERIZATION N/A 10/11/2015   Procedure: Intravascular Pressure Wire/FFR Study;  Surgeon: [name], MD;  Location: [place] [facility];  Service: Cardiovascular;  Laterality: N/A;  . CARDIAC CATHETERIZATION N/A 10/14/2015   Procedure: Coronary Stent Intervention;  Surgeon: [name], MD;  Location: [place] [facility];  Service: Cardiovascular;  Laterality: N/A;  . CARDIOVERSION N/A 04/05/2015   Procedure: CARDIOVERSION;  Surgeon: [name], MD;  Location: [place] [facility];  Service: Cardiovascular;  Laterality: N/A;  . CARDIOVERSION N/A 12/09/2015   Procedure: CARDIOVERSION;  Surgeon: [name], MD;  Location: [place] [facility];  Service: Cardiovascular;  Laterality: N/A;  . CARDIOVERSION N/A 03/16/2016   Procedure: CARDIOVERSION;  Surgeon: [name], MD;  Location: [place];  Service: Cardiovascular;  Laterality: N/A;  . JOINT REPLACEMENT Bilateral 2001   Hips     Current Outpatient Prescriptions  Medication Sig Dispense Refill  . allopurinol (ZYLOPRIM) 300 MG  tablet Take 150 mg by mouth daily.    [name] amiodarone (PACERONE) 200 MG tablet Take 300 mg by mouth daily. Take one and a half tablets (300 mg) daily    . amLODipine (NORVASC) 5 MG tablet Take 1 tablet (5 mg total) by mouth daily. 30 tablet 11  . atenolol (TENORMIN) 25 MG tablet Take 1 tablet (25 mg total) by mouth daily. 90 tablet 3  . atorvastatin (LIPITOR) 80 MG tablet take 1 tablet by mouth once daily AT 6PM 90 tablet 0  . B Complex Vitamins (B  COMPLEX PO) Take 1 tablet by mouth daily.    . cholecalciferol (VITAMIN D) 1000 UNITS tablet Take 1,000 Units by mouth daily.    . clopidogrel (PLAVIX) 75 MG tablet Take 1 tablet (75 mg total) by mouth daily with breakfast. 30 tablet 11  . colchicine 0.6 MG tablet take 1 tablet by mouth once daily TO PREVENT GOUT 90 tablet 0  . HYDROcodone-acetaminophen (NORCO/VICODIN) 5-325 MG tablet Take 1 tablet by mouth every 6 (six) hours as needed for moderate pain. 60 tablet 0  . levothyroxine (SYNTHROID, LEVOTHROID) 75 MCG tablet Take 1 tablet (75 mcg total) by mouth daily before breakfast. 30 tablet 1  . loratadine (CLARITIN) 10 MG tablet Take 10 mg by mouth daily.    . magnesium gluconate (MAGONATE) 500 MG tablet Take 500 mg by mouth 2 (two) times daily.     . metFORMIN (GLUCOPHAGE) 500 MG tablet take 1 tablet by mouth twice a day with food 60 tablet 3  . nystatin cream (MYCOSTATIN) Apply 1 application topically 2 (two) times daily as needed (wound care). 30 g 1  . valsartan (DIOVAN) 320 MG tablet take 1 tablet by mouth once daily 90 tablet 0  . XARELTO 20 MG TABS tablet take 1 tablet by mouth once daily WITH SUPPER 30 tablet 4   No current facility-administered medications for this visit.     Allergies:   Bee venom   Social History:  The patient  reports that he has never smoked. He has never used smokeless tobacco. He reports that he does not drink alcohol or use drugs.   Family History:  The patient's family history includes Colon cancer in his sister; Lung cancer in his father and mother.    ROS:  Please see the history of present illness.   Otherwise, review of systems is positive for cough.   All other systems are reviewed and negative.    PHYSICAL EXAM: VS:  BP (!) 150/74   Pulse 64   Ht 5\' 11"  (1.803 m)   Wt 255 lb 12.8 oz (116 kg)   BMI 35.68 kg/m  , BMI Body mass index is 35.68 kg/m. GEN: Well nourished, well developed, in no acute distress  HEENT: normal  Neck: no JVD, carotid  bruits, or masses Cardiac: RRR; no murmurs, rubs, or gallops,no edema  Respiratory:  clear to auscultation bilaterally, normal work of breathing GI: soft, nontender, nondistended, + BS MS: no deformity or atrophy  Skin: warm and dry Neuro:  Strength and sensation are intact Psych: euthymic mood, full affect  EKG:  EKG is not ordered today. Personal review of the ekg ordered 04/23/16 shows sinus rhythm, prolonged QTc 493, rate 55  Recent Labs: 05/21/2016: ALT 28; BUN 15; Creat 0.93; Hemoglobin 13.8; Magnesium 1.8; Platelets 321; Potassium 3.8; Sodium 140; TSH 8.79    Lipid Panel     Component Value Date/Time   CHOL 113 05/21/2016 1555   TRIG  120 05/21/2016 1555   HDL 48 05/21/2016 1555   CHOLHDL 2.4 05/21/2016 1555   VLDL 24 05/21/2016 1555   LDLCALC 41 05/21/2016 1555     Wt Readings from Last 3 Encounters:  06/01/16 255 lb 12.8 oz (116 kg)  05/21/16 263 lb 6.4 oz (119.5 kg)  05/06/16 255 lb (115.7 kg)      Other studies Reviewed: Additional studies/ records that were reviewed today include: TTE 03/2015, Cath 10/14/15  Review of the above records today demonstrates:  - Left ventricle: The cavity size was normal. Wall thickness was   increased in a pattern of mild LVH. Systolic function was normal.   The estimated ejection fraction was in the range of 60% to 65%.   Indeterminant diastolic function (atrial fibrillation). Wall   motion was normal; there were no regional wall motion   abnormalities. - Aortic valve: There was no stenosis. - Aorta: Borderline dilated aortic root. - Mitral valve: There was trivial regurgitation. - Left atrium: The atrium was mildly dilated. - Right ventricle: The cavity size was moderately dilated. Systolic   function was normal. - Right atrium: The atrium was mildly dilated. - Tricuspid valve: Peak RV-RA gradient (S): 32 mm Hg. - Pulmonary arteries: PA peak pressure: 35 mm Hg (S). - Inferior vena cava: The vessel was normal in size.  The   respirophasic diameter changes were in the normal range (>= 50%),   consistent with normal central venous pressure.   Ost 1st Mrg to 1st Mrg lesion to mid circumflex, 95% stenosed. Post intervention with a 2.75 x 32 Synergy, postdilated to 3.5 mm, there is a 0% residual stenosis.  The Synergy stent extends from the OM back into the native circumflex, jailing the mid circumflex. Postprocedure, there is TIMI-3 flow in the circumflex with only a moderate stenosis at the jailed portion.   Cath 10/11/15  LM lesion, 50% stenosed.  Dist LAD lesion, 40% stenosed.  Ost Cx to Prox Cx lesion, 70% stenosed.  1st Mrg lesion, 70% stenosed.  Mid LAD lesion, 40% stenosed.  Mid RCA to Dist RCA lesion, 95% stenosed. Post intervention, there is a 0% residual stenosis. The lesion was not previously treated.   ASSESSMENT AND PLAN:  1.  Persistent atrial fibrillation/flutter: on amiodarone and Xarelto. Had cardioversion 03/16/16. Remains in sinus rhythm. Discussed with him options including ablation. Risks of ablation were discussed. Risks include bleeding, tamponade, heart block, stroke, and damage to surrounding organs. Ablation scheduled 06/11/16.  This patients CHA2DS2-VASc Score and unadjusted Ischemic Stroke Rate (% per year) is equal to 4.8 % stroke rate/year from a score of 4  Above score calculated as 1 point each if present [CHF, HTN, DM, Vascular=MI/PAD/Aortic Plaque, Age if 65-74, or Male] Above score calculated as 2 points each if present [Age > 75, or Stroke/TIA/TE]  2. Hypertension: Elevated today, but he has been sick. Taylee Gunnells recheck after his viral illness.  3. Hyperlipidemia:Continue statin  4. CAD: s/p PCI to Cx and RCA.No current chest pain  5. OSA: Discussed sleep apnea compliance. He Lettie Czarnecki consider this in the future.     Current medicines are reviewed at length with the patient today.   The patient does not have concerns regarding his medicines.  The following  changes were made today:  none  Labs/ tests ordered today include:  No orders of the defined types were placed in this encounter.    Disposition:   FU with Raimi Guillermo 3 months  Signed, Nicie Milan [name], MD  06/01/2016 12:24 PM     [address] [place] [address] [phone] (office) [phone] (fax)

## 2016-06-01 NOTE — Patient Instructions (Addendum)
Medication Instructions:    Your physician recommends that you continue on your current medications as directed. Please refer to the Current Medication list given to you today.  --- If you need a refill on your cardiac medications before your next appointment, please call your pharmacy. ---  Labwork:  Pre procedure labs today: BMET and CBC w/ diff  Testing/Procedures:  None ordered  Follow-Up:  Your physician recommends that you schedule a follow-up appointment in: 4 weeks, after your procedure on 06/11/16, with Roderic Palau in the AFib clinic.   Your physician recommends that you schedule a follow-up appointment in: 3 months, after your procedure on 06/11/16, with Dr. Curt Bears.  Thank you for choosing [facility]!!   Trinidad Curet, RN [phone]

## 2016-06-02 ENCOUNTER — Telehealth: Payer: Self-pay | Admitting: *Deleted

## 2016-06-02 LAB — BASIC METABOLIC PANEL
BUN/Creatinine Ratio: 11 (ref 10–24)
BUN: 17 mg/dL (ref 8–27)
CO2: 27 mmol/L (ref 18–29)
Calcium: 8.9 mg/dL (ref 8.6–10.2)
Chloride: 98 mmol/L (ref 96–106)
Creatinine, Ser: 1.51 mg/dL — ABNORMAL HIGH (ref 0.76–1.27)
GFR, EST AFRICAN AMERICAN: 55 mL/min/{1.73_m2} — AB (ref >59)
GFR, EST NON AFRICAN AMERICAN: 47 mL/min/{1.73_m2} — AB (ref >59)
Glucose: 117 mg/dL — ABNORMAL HIGH (ref 65–99)
POTASSIUM: 3.2 mmol/L — AB (ref 3.5–5.2)
SODIUM: 143 mmol/L (ref 134–144)

## 2016-06-02 LAB — CBC WITH DIFFERENTIAL/PLATELET
BASOS: 0 %
Basophils Absolute: 0 10*3/uL (ref 0.0–0.2)
EOS (ABSOLUTE): 0.1 10*3/uL (ref 0.0–0.4)
Eos: 1 %
Hematocrit: 39.2 % (ref 37.5–51.0)
Hemoglobin: 13.3 g/dL (ref 13.0–17.7)
Immature Grans (Abs): 0 10*3/uL (ref 0.0–0.1)
Immature Granulocytes: 0 %
LYMPHS ABS: 0.9 10*3/uL (ref 0.7–3.1)
Lymphs: 11 %
MCH: 32.8 pg (ref 26.6–33.0)
MCHC: 33.9 g/dL (ref 31.5–35.7)
MCV: 97 fL (ref 79–97)
MONOS ABS: 0.8 10*3/uL (ref 0.1–0.9)
Monocytes: 11 %
NEUTROS ABS: 5.9 10*3/uL (ref 1.4–7.0)
Neutrophils: 77 %
PLATELETS: 240 10*3/uL (ref 150–379)
RBC: 4.06 x10E6/uL — ABNORMAL LOW (ref 4.14–5.80)
RDW: 15.2 % (ref 12.3–15.4)
WBC: 7.7 10*3/uL (ref 3.4–10.8)

## 2016-06-02 LAB — SPECIMEN STATUS

## 2016-06-02 MED ORDER — POTASSIUM CHLORIDE CRYS ER 20 MEQ PO TBCR: 20.0000 meq | EXTENDED_RELEASE_TABLET | Freq: Every day | ORAL | 0 refills | Status: DC

## 2016-06-02 NOTE — Telephone Encounter (Signed)
-----   Message from Will [name], MD sent at 06/02/2016 12:51 PM EST ----- Normal preop labs. K needs to be repleted. 20 per day x 7 days.

## 2016-06-02 NOTE — Telephone Encounter (Signed)
Advised pt who is agreeable.  Rx for 7 day supply of potassium supplementation sent to [facility] per pt request.  He is agreeable to plan.

## 2016-06-04 ENCOUNTER — Ambulatory Visit (HOSPITAL_COMMUNITY)
Admission: RE | Admit: 2016-06-04 | Discharge: 2016-06-04 | Disposition: A | Discharge status: Home / Self Care | Payer: Medicare Other | Source: Ambulatory Visit | Attending: Cardiology | Admitting: Cardiology

## 2016-06-04 ENCOUNTER — Encounter (HOSPITAL_COMMUNITY): Payer: Self-pay

## 2016-06-04 ENCOUNTER — Other Ambulatory Visit: Payer: Self-pay | Admitting: Internal Medicine

## 2016-06-04 ENCOUNTER — Other Ambulatory Visit: Payer: Self-pay | Admitting: Physician Assistant

## 2016-06-04 DIAGNOSIS — I4891 Unspecified atrial fibrillation: Secondary | ICD-10-CM | POA: Diagnosis not present

## 2016-06-04 DIAGNOSIS — I480 Paroxysmal atrial fibrillation: Secondary | ICD-10-CM | POA: Diagnosis not present

## 2016-06-04 MED ORDER — METOPROLOL TARTRATE 5 MG/5ML IV SOLN
5.0000 mg | Freq: Once | INTRAVENOUS | Status: AC
  Administered 2016-06-04: 5 mg via INTRAVENOUS

## 2016-06-04 MED ORDER — NITROGLYCERIN 0.4 MG SL SUBL
SUBLINGUAL_TABLET | SUBLINGUAL | Status: AC
  Filled 2016-06-04: qty 1

## 2016-06-04 MED ORDER — METOPROLOL TARTRATE 5 MG/5ML IV SOLN
INTRAVENOUS | Status: AC
  Filled 2016-06-04: qty 5

## 2016-06-04 MED ORDER — IOPAMIDOL (ISOVUE-370) INJECTION 76%
INTRAVENOUS | Status: AC
  Administered 2016-06-04: 80 mL
  Filled 2016-06-04: qty 100

## 2016-06-04 MED ORDER — NITROGLYCERIN 0.4 MG SL SUBL
0.4000 mg | SUBLINGUAL_TABLET | Freq: Once | SUBLINGUAL | Status: AC
  Administered 2016-06-04: 0.4 mg via SUBLINGUAL

## 2016-06-04 NOTE — Progress Notes (Signed)
CT scan completed. Tolerated well. D/C home by himself walking. Awake and alert. In no distress. 

## 2016-06-08 ENCOUNTER — Telehealth: Payer: Self-pay | Admitting: *Deleted

## 2016-06-08 DIAGNOSIS — R931 Abnormal findings on diagnostic imaging of heart and coronary circulation: Secondary | ICD-10-CM

## 2016-06-08 DIAGNOSIS — I25709 Atherosclerosis of coronary artery bypass graft(s), unspecified, with unspecified angina pectoris: Secondary | ICD-10-CM

## 2016-06-08 NOTE — Telephone Encounter (Signed)
-----   Message from Will [name], MD sent at 06/07/2016  3:17 PM EST ----- CT reviewed. Has lymphadenopathy worse with recommended 6 month follow up per PCP.  Also has possible LAD disease. Per CT reader, should do nuclear study to further assess  No issues pre ablation.

## 2016-06-08 NOTE — Telephone Encounter (Signed)
Pt understands office will call to arrange Lexi in February.

## 2016-06-11 ENCOUNTER — Encounter (HOSPITAL_COMMUNITY)
Admission: RE | Disposition: A | Discharge status: Home / Self Care | Payer: Self-pay | Source: Ambulatory Visit | Attending: Cardiology

## 2016-06-11 ENCOUNTER — Ambulatory Visit (HOSPITAL_COMMUNITY)
Admission: RE | Admit: 2016-06-11 | Discharge: 2016-06-12 | Disposition: A | Discharge status: Home / Self Care | Payer: Medicare Other | Source: Ambulatory Visit | Attending: Cardiology | Admitting: Cardiology

## 2016-06-11 ENCOUNTER — Ambulatory Visit (HOSPITAL_COMMUNITY): Payer: Medicare Other | Admitting: Certified Registered Nurse Anesthetist

## 2016-06-11 ENCOUNTER — Encounter (HOSPITAL_COMMUNITY): Payer: Self-pay | Admitting: Certified Registered Nurse Anesthetist

## 2016-06-11 DIAGNOSIS — Z6836 Body mass index (BMI) 36.0-36.9, adult: Secondary | ICD-10-CM | POA: Insufficient documentation

## 2016-06-11 DIAGNOSIS — E785 Hyperlipidemia, unspecified: Secondary | ICD-10-CM | POA: Insufficient documentation

## 2016-06-11 DIAGNOSIS — I10 Essential (primary) hypertension: Secondary | ICD-10-CM | POA: Diagnosis not present

## 2016-06-11 DIAGNOSIS — I2510 Atherosclerotic heart disease of native coronary artery without angina pectoris: Secondary | ICD-10-CM | POA: Insufficient documentation

## 2016-06-11 DIAGNOSIS — G4733 Obstructive sleep apnea (adult) (pediatric): Secondary | ICD-10-CM | POA: Diagnosis not present

## 2016-06-11 DIAGNOSIS — I4891 Unspecified atrial fibrillation: Secondary | ICD-10-CM | POA: Diagnosis not present

## 2016-06-11 DIAGNOSIS — I480 Paroxysmal atrial fibrillation: Secondary | ICD-10-CM | POA: Insufficient documentation

## 2016-06-11 DIAGNOSIS — E669 Obesity, unspecified: Secondary | ICD-10-CM | POA: Insufficient documentation

## 2016-06-11 HISTORY — DX: Hypothyroidism, unspecified: E03.9

## 2016-06-11 HISTORY — DX: Bee allergy status: Z91.030

## 2016-06-11 HISTORY — PX: ELECTROPHYSIOLOGIC STUDY: SHX172A

## 2016-06-11 HISTORY — DX: Personal history of other diseases of the musculoskeletal system and connective tissue: Z87.39

## 2016-06-11 HISTORY — DX: Type 2 diabetes mellitus without complications: E11.9

## 2016-06-11 LAB — GLUCOSE, CAPILLARY
GLUCOSE-CAPILLARY: 123 mg/dL — AB (ref 65–99)
GLUCOSE-CAPILLARY: 161 mg/dL — AB (ref 65–99)
Glucose-Capillary: 128 mg/dL — ABNORMAL HIGH (ref 65–99)
Glucose-Capillary: 105 mg/dL — ABNORMAL HIGH (ref 65–99)

## 2016-06-11 LAB — POCT ACTIVATED CLOTTING TIME
Activated Clotting Time: 428 seconds
Activated Clotting Time: 301 seconds
Activated Clotting Time: 351 seconds
Activated Clotting Time: 158 seconds

## 2016-06-11 LAB — BASIC METABOLIC PANEL
ANION GAP: 15 (ref 5–15)
BUN: 15 mg/dL (ref 6–20)
CALCIUM: 9.4 mg/dL (ref 8.9–10.3)
CO2: 25 mmol/L (ref 22–32)
Chloride: 98 mmol/L — ABNORMAL LOW (ref 101–111)
Creatinine, Ser: 1.29 mg/dL — ABNORMAL HIGH (ref 0.61–1.24)
GFR calc Af Amer: >60 mL/min (ref >60)
GFR, EST NON AFRICAN AMERICAN: 56 mL/min — AB (ref >60)
Glucose, Bld: 127 mg/dL — ABNORMAL HIGH (ref 65–99)
Potassium: 3.4 mmol/L — ABNORMAL LOW (ref 3.5–5.1)
Sodium: 138 mmol/L (ref 135–145)

## 2016-06-11 SURGERY — ATRIAL FIBRILLATION ABLATION
Anesthesia: General

## 2016-06-11 MED ORDER — PROTAMINE SULFATE 10 MG/ML IV SOLN
INTRAVENOUS | Status: DC | PRN
  Administered 2016-06-11: 40 mg via INTRAVENOUS

## 2016-06-11 MED ORDER — ONDANSETRON HCL 4 MG/2ML IJ SOLN
INTRAMUSCULAR | Status: DC | PRN
  Administered 2016-06-11: 4 mg via INTRAVENOUS

## 2016-06-11 MED ORDER — BUPIVACAINE HCL (PF) 0.25 % IJ SOLN
INTRAMUSCULAR | Status: AC
  Filled 2016-06-11: qty 30

## 2016-06-11 MED ORDER — SODIUM CHLORIDE 0.9 % IV SOLN
INTRAVENOUS | Status: DC | PRN
  Administered 2016-06-11: 08:00:00 via INTRAVENOUS

## 2016-06-11 MED ORDER — NEOSTIGMINE METHYLSULFATE 10 MG/10ML IV SOLN
INTRAVENOUS | Status: DC | PRN
  Administered 2016-06-11: 3 mg via INTRAVENOUS

## 2016-06-11 MED ORDER — LEVOTHYROXINE SODIUM 75 MCG PO TABS
75.0000 ug | ORAL_TABLET | Freq: Every day | ORAL | Status: DC
  Administered 2016-06-11 – 2016-06-12 (×2): 75 ug via ORAL
  Filled 2016-06-11 (×2): qty 1

## 2016-06-11 MED ORDER — SODIUM CHLORIDE 0.9% FLUSH
3.0000 mL | Freq: Two times a day (BID) | INTRAVENOUS | Status: DC
  Administered 2016-06-11: 3 mL via INTRAVENOUS

## 2016-06-11 MED ORDER — LIDOCAINE HCL (CARDIAC) 20 MG/ML IV SOLN
INTRAVENOUS | Status: DC | PRN
  Administered 2016-06-11: 50 mg via INTRAVENOUS

## 2016-06-11 MED ORDER — ACETAMINOPHEN 325 MG PO TABS: 650.0000 mg | ORAL_TABLET | ORAL | Status: DC | PRN

## 2016-06-11 MED ORDER — FENTANYL CITRATE (PF) 100 MCG/2ML IJ SOLN
INTRAMUSCULAR | Status: DC | PRN
  Administered 2016-06-11: 100 ug via INTRAVENOUS
  Administered 2016-06-11 (×2): 50 ug via INTRAVENOUS

## 2016-06-11 MED ORDER — VITAMIN D 1000 UNITS PO TABS
1000.0000 [IU] | ORAL_TABLET | Freq: Every day | ORAL | Status: DC
  Administered 2016-06-11 – 2016-06-12 (×2): 1000 [IU] via ORAL
  Filled 2016-06-11 (×2): qty 1

## 2016-06-11 MED ORDER — RIVAROXABAN 20 MG PO TABS
20.0000 mg | ORAL_TABLET | Freq: Every day | ORAL | Status: DC
  Administered 2016-06-11: 20 mg via ORAL
  Filled 2016-06-11: qty 1

## 2016-06-11 MED ORDER — MAGNESIUM GLUCONATE 500 MG PO TABS
1000.0000 mg | ORAL_TABLET | Freq: Two times a day (BID) | ORAL | Status: DC
  Administered 2016-06-11 – 2016-06-12 (×2): 1000 mg via ORAL
  Filled 2016-06-11 (×2): qty 2

## 2016-06-11 MED ORDER — DOBUTAMINE IN D5W 4-5 MG/ML-% IV SOLN
INTRAVENOUS | Status: AC
  Filled 2016-06-11: qty 250

## 2016-06-11 MED ORDER — ATENOLOL 25 MG PO TABS
25.0000 mg | ORAL_TABLET | Freq: Every day | ORAL | Status: DC
  Administered 2016-06-11 – 2016-06-12 (×2): 25 mg via ORAL
  Filled 2016-06-11 (×2): qty 1

## 2016-06-11 MED ORDER — COLCHICINE 0.6 MG PO TABS
0.6000 mg | ORAL_TABLET | Freq: Every day | ORAL | Status: DC
  Administered 2016-06-11 – 2016-06-12 (×2): 0.6 mg via ORAL
  Filled 2016-06-11 (×2): qty 1

## 2016-06-11 MED ORDER — PROPOFOL 10 MG/ML IV BOLUS
INTRAVENOUS | Status: DC | PRN
  Administered 2016-06-11: 200 mg via INTRAVENOUS

## 2016-06-11 MED ORDER — DOBUTAMINE IN D5W 4-5 MG/ML-% IV SOLN
INTRAVENOUS | Status: DC | PRN
  Administered 2016-06-11: 20 ug/kg/min via INTRAVENOUS

## 2016-06-11 MED ORDER — MIDAZOLAM HCL 5 MG/5ML IJ SOLN
INTRAMUSCULAR | Status: DC | PRN
  Administered 2016-06-11: 2 mg via INTRAVENOUS

## 2016-06-11 MED ORDER — HEPARIN SODIUM (PORCINE) 1000 UNIT/ML IJ SOLN
INTRAMUSCULAR | Status: DC | PRN
  Administered 2016-06-11: 1000 [IU] via INTRAVENOUS
  Administered 2016-06-11: 15000 [IU] via INTRAVENOUS

## 2016-06-11 MED ORDER — ROCURONIUM BROMIDE 100 MG/10ML IV SOLN
INTRAVENOUS | Status: DC | PRN
  Administered 2016-06-11: 50 mg via INTRAVENOUS

## 2016-06-11 MED ORDER — CLOPIDOGREL BISULFATE 75 MG PO TABS
75.0000 mg | ORAL_TABLET | Freq: Every day | ORAL | Status: DC
  Administered 2016-06-11 – 2016-06-12 (×2): 75 mg via ORAL
  Filled 2016-06-11 (×2): qty 1

## 2016-06-11 MED ORDER — HEPARIN (PORCINE) IN NACL 2-0.9 UNIT/ML-% IJ SOLN
INTRAMUSCULAR | Status: AC
  Filled 2016-06-11: qty 500

## 2016-06-11 MED ORDER — ALLOPURINOL 300 MG PO TABS
300.0000 mg | ORAL_TABLET | Freq: Every day | ORAL | Status: DC
  Administered 2016-06-11 – 2016-06-12 (×2): 300 mg via ORAL
  Filled 2016-06-11 (×2): qty 1

## 2016-06-11 MED ORDER — HEPARIN (PORCINE) IN NACL 2-0.9 UNIT/ML-% IJ SOLN
INTRAMUSCULAR | Status: AC
Start: 2016-06-11 — End: 2016-06-11
  Filled 2016-06-11: qty 500

## 2016-06-11 MED ORDER — GLYCOPYRROLATE 0.2 MG/ML IJ SOLN
INTRAMUSCULAR | Status: DC | PRN
  Administered 2016-06-11: 0.4 mg via INTRAVENOUS

## 2016-06-11 MED ORDER — HEPARIN (PORCINE) IN NACL 2-0.9 UNIT/ML-% IJ SOLN
INTRAMUSCULAR | Status: DC | PRN
  Administered 2016-06-11: 08:00:00

## 2016-06-11 MED ORDER — ONDANSETRON HCL 4 MG/2ML IJ SOLN: 4.0000 mg | Freq: Four times a day (QID) | INTRAMUSCULAR | Status: DC | PRN

## 2016-06-11 MED ORDER — SODIUM CHLORIDE 0.9% FLUSH: 3.0000 mL | INTRAVENOUS | Status: DC | PRN

## 2016-06-11 MED ORDER — SODIUM CHLORIDE 0.9 % IV SOLN: 250.0000 mL | INTRAVENOUS | Status: DC | PRN

## 2016-06-11 MED ORDER — LORATADINE 10 MG PO TABS
10.0000 mg | ORAL_TABLET | Freq: Every day | ORAL | Status: DC
  Administered 2016-06-11 – 2016-06-12 (×2): 10 mg via ORAL
  Filled 2016-06-11 (×2): qty 1

## 2016-06-11 MED ORDER — AMIODARONE HCL 200 MG PO TABS
300.0000 mg | ORAL_TABLET | Freq: Every day | ORAL | Status: DC
  Administered 2016-06-11 – 2016-06-12 (×2): 300 mg via ORAL
  Filled 2016-06-11 (×2): qty 1

## 2016-06-11 MED ORDER — POTASSIUM CHLORIDE CRYS ER 20 MEQ PO TBCR
20.0000 meq | EXTENDED_RELEASE_TABLET | Freq: Every day | ORAL | Status: DC
  Administered 2016-06-12: 20 meq via ORAL
  Filled 2016-06-11 (×2): qty 1

## 2016-06-11 MED ORDER — BUPIVACAINE HCL (PF) 0.25 % IJ SOLN
INTRAMUSCULAR | Status: DC | PRN
  Administered 2016-06-11: 40 mL

## 2016-06-11 MED ORDER — OFF THE BEAT BOOK
Freq: Once | Status: AC
  Administered 2016-06-12
  Filled 2016-06-11: qty 1

## 2016-06-11 MED ORDER — AMLODIPINE BESYLATE 5 MG PO TABS
5.0000 mg | ORAL_TABLET | Freq: Every day | ORAL | Status: DC
  Administered 2016-06-11 – 2016-06-12 (×2): 5 mg via ORAL
  Filled 2016-06-11 (×2): qty 1

## 2016-06-11 MED ORDER — HYDROCODONE-ACETAMINOPHEN 5-325 MG PO TABS
1.0000 | ORAL_TABLET | Freq: Four times a day (QID) | ORAL | Status: DC | PRN
  Administered 2016-06-11 – 2016-06-12 (×3): 1 via ORAL
  Filled 2016-06-11 (×3): qty 1

## 2016-06-11 MED ORDER — PHENYLEPHRINE HCL 10 MG/ML IJ SOLN
INTRAVENOUS | Status: DC | PRN
  Administered 2016-06-11: 20 ug/min via INTRAVENOUS

## 2016-06-11 MED ORDER — METFORMIN HCL 500 MG PO TABS
500.0000 mg | ORAL_TABLET | Freq: Two times a day (BID) | ORAL | Status: DC
  Administered 2016-06-11 – 2016-06-12 (×2): 500 mg via ORAL
  Filled 2016-06-11 (×2): qty 1

## 2016-06-11 SURGICAL SUPPLY — 19 items
BAG SNAP BAND KOVER 36X36 (MISCELLANEOUS) ×3 IMPLANT
BLANKET WARM UNDERBOD FULL ACC (MISCELLANEOUS) ×3 IMPLANT
CATH SMTCH THERMOCOOL SF DF (CATHETERS) ×3 IMPLANT
CATH SOUNDSTAR 3D IMAGING (CATHETERS) ×3 IMPLANT
CATH VARIABLE LASSO NAV 2515 (CATHETERS) ×3 IMPLANT
CATH WEBSTER BI DIR CS D-F CRV (CATHETERS) ×3 IMPLANT
COVER SWIFTLINK CONNECTOR (BAG) ×3 IMPLANT
NEEDLE TRANSSEPTAL BRK 98CM (NEEDLE) ×3 IMPLANT
PACK EP LATEX FREE (CUSTOM PROCEDURE TRAY) ×2
PACK EP LF (CUSTOM PROCEDURE TRAY) ×1 IMPLANT
PAD DEFIB LIFELINK (PAD) ×3 IMPLANT
PATCH CARTO3 (PAD) ×3 IMPLANT
SHEATH AGILIS NXT 8.5F 71CM (SHEATH) ×6 IMPLANT
SHEATH AVANTI 11F 11CM (SHEATH) ×3 IMPLANT
SHEATH PINNACLE 7F 10CM (SHEATH) ×3 IMPLANT
SHEATH PINNACLE 8F 10CM (SHEATH) ×6 IMPLANT
SHEATH PINNACLE 9F 10CM (SHEATH) ×6 IMPLANT
SHIELD RADPAD SCOOP 12X17 (MISCELLANEOUS) ×3 IMPLANT
TUBING SMART ABLATE COOLFLOW (TUBING) ×3 IMPLANT

## 2016-06-11 NOTE — Anesthesia Postprocedure Evaluation (Signed)
Anesthesia Post Note  Patient: Christopher Wade  Procedure(s) Performed: Procedure(s) (LRB): Atrial Fibrillation Ablation (N/A)  Anesthesia Type: General       Last Vitals:  Vitals:   06/11/16 1230 06/11/16 1249  BP: (!) 149/76   Pulse:    Resp:    Temp:  36.6 C    Last Pain:  Vitals:   06/11/16 1249  TempSrc: Oral  PainSc:                  [name]

## 2016-06-11 NOTE — H&P (Signed)
Christopher Wade is a 66 y.o. male with a history of persistent atrial fibrillation.  He presents today for ablation of his atrial fibrillation.  On exam, irregular rhythm, no murmurs, lungs clear.  He has been complaint with his anticoagulation.  Risks and benefits of the procedure were discussed.  Risks include but not limited to bleeding, tamponade, heart block, stroke, and damage to surrounding organs, among others.  He understands the risks and has agreed to the procedure.  Shirley Decamp Curt Bears, MD 06/11/2016 7:13 AM

## 2016-06-11 NOTE — Anesthesia Preprocedure Evaluation (Addendum)
Anesthesia Evaluation  Patient identified by MRN, date of birth, ID band Patient awake    Reviewed: Allergy & Precautions, NPO status , Patient's Chart, lab work & pertinent test results  Airway Mallampati: II  TM Distance: >3 FB Neck ROM: Full    Dental  (+) Teeth Intact, Dental Advisory Given   Pulmonary    breath sounds clear to auscultation       Cardiovascular hypertension,  Rhythm:Regular Rate:Normal     Neuro/Psych    GI/Hepatic   Endo/Other  diabetes  Renal/GU      Musculoskeletal   Abdominal   Peds  Hematology   Anesthesia Other Findings   Reproductive/Obstetrics                            Anesthesia Physical Anesthesia Plan  ASA: III  Anesthesia Plan: General   Post-op Pain Management:    Induction: Intravenous  Airway Management Planned: Oral ETT  Additional Equipment:   Intra-op Plan:   Post-operative Plan: Extubation in OR  Informed Consent: I have reviewed the patients History and Physical, chart, labs and discussed the procedure including the risks, benefits and alternatives for the proposed anesthesia with the patient or authorized representative who has indicated his/her understanding and acceptance.   Dental advisory given  Plan Discussed with: CRNA and Anesthesiologist  Anesthesia Plan Comments: (Persistent Afib Hypertension CAD S/P DEA stents 5/17 no angina Type 2 DM glucose 127 Renal insufficiency Cr 1.27  Plan GA with oral ETT)       Anesthesia Quick Evaluation

## 2016-06-11 NOTE — Progress Notes (Signed)
Site area: left groin Site Prior to Removal:  Level 0 Pressure Applied For:  20 minutes Manual:   yes Patient Status During Pull:  stable Post Pull Site:  Level  0 Post Pull Instructions Given:  yes Post Pull Pulses Present:  yes Dressing Applied:  yes Bedrest begins @  [id number] Comments:

## 2016-06-11 NOTE — Discharge Summary (Signed)
ELECTROPHYSIOLOGY PROCEDURE DISCHARGE SUMMARY    Patient ID: Christopher Wade,  MRN: [id number], DOB/AGE: Jan 03, 1951 66 y.o.  Admit date: 06/11/2016 Discharge date: 06/12/2016  Primary Care Physician: Alesia Richards, MD Primary Cardiologist: Radford Pax Electrophysiologist: [facility]  Primary Discharge Diagnosis:  Persistent atrial fibrillation status post ablation this admission  Secondary Discharge Diagnosis:  1.  HTN 2.  Hyperlipidemia 3.  CAD 4.  OSA 5.  Obesity  Procedures This Admission:  1.  Electrophysiology study and radiofrequency catheter ablation on 06/11/16 by Dr Curt Bears.  This study demonstrated sinus rhythm upon presentation; successful electrical isolation and anatomical encircling of all four pulmonary veins with radiofrequency current; no inducible arrhythmias following ablation both on and off of Isuprel; no early apparent complications..    Brief HPI: Christopher Wade is a 66 y.o. male with a history of persistent atrial fibrillation.  They have failed medical therapy with amiodarone. Risks, benefits, and alternatives to catheter ablation of atrial fibrillation were reviewed with the patient who wished to proceed.  The patient underwent cardiac CT prior to the procedure which demonstrated no LAA thrombus.    Hospital Course:  The patient was admitted and underwent EPS/RFCA of atrial fibrillation with details as outlined above.  They were monitored on telemetry overnight which demonstrated sinus rhythm.  Groin was without complication on the day of discharge.  The patient was examined and considered to be stable for discharge.  Wound care and restrictions were reviewed with the patient.  The patient will be seen back by Roderic Palau, NP in 4 weeks and Dr Curt Bears in 12 weeks for post ablation follow up.   This patients CHA2DS2-VASc Score and unadjusted Ischemic Stroke Rate (% per year) is equal to 3.2 % stroke rate/year from a score of 3 Above score calculated as 1  point each if present [CHF, HTN, DM, Vascular=MI/PAD/Aortic Plaque, Age if 65-74, or Male] Above score calculated as 2 points each if present [Age > 75, or Stroke/TIA/TE]   Physical Exam: Vitals:   06/11/16 2000 06/11/16 2330 06/12/16 0328 06/12/16 0447  BP:  (!) 147/69 (!) 158/75 (!) 157/76  Pulse: 62 66 67 67  Resp: 17 (!) 26 (!) 30 (!) 26  Temp:    97.9 F (36.6 C)  TempSrc:    Axillary  SpO2: 93% 91% 91% 91%  Weight:    258 lb 2.5 oz (117.1 kg)  Height:        GEN- The patient is well appearing, alert and oriented x 3 today.   HEENT: normocephalic, atraumatic; sclera clear, conjunctiva pink; hearing intact; oropharynx clear; neck supple  Lungs- Clear to ausculation bilaterally, normal work of breathing.  No wheezes, rales, rhonchi Heart- Regular rate and rhythm, no murmurs, rubs or gallops  GI- soft, non-tender, non-distended, bowel sounds present  Extremities- no clubbing, cyanosis, or edema; DP/PT/radial pulses 2+ bilaterally, groin without hematoma/bruit MS- no significant deformity or atrophy Skin- warm and dry, no rash or lesion Psych- euthymic mood, full affect Neuro- strength and sensation are intact   Labs:   Lab Results  Component Value Date   WBC 7.7 06/01/2016   HGB 13.8 05/21/2016   HCT 39.2 06/01/2016   MCV 97 06/01/2016   PLT 240 06/01/2016     Recent Labs Lab 06/11/16 0600  NA 138  K 3.4*  CL 98*  CO2 25  BUN 15  CREATININE 1.29*  CALCIUM 9.4  GLUCOSE 127*     Discharge Medications:  Allergies as of 06/12/2016  Reactions   Bee Venom Other (See Comments)   Leg went numb      Medication List    TAKE these medications   allopurinol 300 MG tablet Commonly known as:  ZYLOPRIM take 1 tablet by mouth once daily What changed:  See the new instructions.   amiodarone 200 MG tablet Commonly known as:  PACERONE Take 300 mg by mouth daily. Take one and a half tablets (300 mg) daily   amLODipine 5 MG tablet Commonly known as:   NORVASC Take 1 tablet (5 mg total) by mouth daily.   atenolol 25 MG tablet Commonly known as:  TENORMIN Take 1 tablet (25 mg total) by mouth daily.   atorvastatin 80 MG tablet Commonly known as:  LIPITOR take 1 tablet by mouth once daily AT 6PM   B COMPLEX PO Take 1 tablet by mouth daily.   cholecalciferol 1000 units tablet Commonly known as:  VITAMIN D Take 1,000 Units by mouth daily.   clopidogrel 75 MG tablet Commonly known as:  PLAVIX Take 1 tablet (75 mg total) by mouth daily with breakfast.   colchicine 0.6 MG tablet take 1 tablet by mouth once daily TO PREVENT GOUT What changed:  See the new instructions.   HYDROcodone-acetaminophen 5-325 MG tablet Commonly known as:  NORCO/VICODIN Take 1 tablet by mouth every 6 (six) hours as needed for moderate pain.   levothyroxine 75 MCG tablet Commonly known as:  SYNTHROID, LEVOTHROID Take 1 tablet (75 mcg total) by mouth daily before breakfast. What changed:  Another medication with the same name was removed. Continue taking this medication, and follow the directions you see here.   loratadine 10 MG tablet Commonly known as:  CLARITIN Take 10 mg by mouth daily.   magnesium gluconate 500 MG tablet Commonly known as:  MAGONATE Take 1,000 mg by mouth 2 (two) times daily.   metFORMIN 500 MG tablet Commonly known as:  GLUCOPHAGE take 1 tablet by mouth twice a day with food   nystatin cream Commonly known as:  MYCOSTATIN Apply 1 application topically 2 (two) times daily as needed (wound care).   potassium chloride SA 20 MEQ tablet Commonly known as:  KLOR-CON M20 Take 1 tablet (20 mEq total) by mouth daily. For 7 days   valsartan 320 MG tablet Commonly known as:  DIOVAN take 1 tablet by mouth once daily   XARELTO 20 MG Tabs tablet Generic drug:  rivaroxaban take 1 tablet by mouth once daily WITH SUPPER       Disposition:  Discharge Instructions    Diet - low sodium heart healthy    Complete by:  As directed     Increase activity slowly    Complete by:  As directed      Follow-up Information    [facility] Follow up on 07/15/2016.   Specialty:  Cardiology Why:  at [facility] information: [id number] [place] [phone]          Duration of Discharge Encounter: Greater than 30 minutes including physician time.  Signed, Chanetta Marshall, NP 06/12/2016 7:06 AM   I have seen, examined the patient, and reviewed the above assessment and plan.  Changes to above are made where necessary.  Pt with pleuritic chest pain this am.  Limited echo by me reveals no effusion or wall motion abnormality.    DC to homoe today.  Routine post procedure follow-up.  Co Sign: Thompson Grayer, MD 06/12/2016 8:13 AM

## 2016-06-11 NOTE — Progress Notes (Signed)
BMP lab values called to [employer] RN.

## 2016-06-11 NOTE — Anesthesia Postprocedure Evaluation (Signed)
Anesthesia Post Note  Patient: Christopher Wade  Procedure(s) Performed: Procedure(s) (LRB): Atrial Fibrillation Ablation (N/A)  Patient location during evaluation: Cath Lab Anesthesia Type: General Level of consciousness: awake, awake and alert and oriented Pain management: pain level controlled Respiratory status: spontaneous breathing Cardiovascular status: blood pressure returned to baseline Anesthetic complications: no       Last Vitals:  Vitals:   06/11/16 1230 06/11/16 1249  BP: (!) 149/76   Pulse:    Resp:    Temp:  36.6 C    Last Pain:  Vitals:   06/11/16 1249  TempSrc: Oral  PainSc:                  Meighan Treto COKER

## 2016-06-11 NOTE — Discharge Instructions (Signed)
You have an appointment set up with the [facility].  Multiple studies have shown that being followed by a dedicated atrial fibrillation clinic in addition to the standard care you receive from your other physicians improves health. We believe that enrollment in the atrial fibrillation clinic will allow [place] to better care for you.   The phone number to the [facility] is [phone]. The clinic is staffed Monday through Friday from 8:30am to 5pm.  Parking Directions: The clinic is located in the Heart and Vascular Building connected to [facility]. 1)From [address] turn on to [address] and go to the 3rd entrance  (Heart and Vascular entrance) on the right. 2)Look to the right for Heart &Vascular Parking Garage. 3)A code for the entrance is required please call the clinic to receive this.   4)Take the elevators to the 1st floor. Registration is in the room with the glass walls at the end of the hallway.  If you have any trouble parking or locating the clinic, please dont hesitate to call [phone].   No driving for 4 days. No lifting over 5 lbs for 1 week. No sexual activity for 1 week. You may return to work in 1 week. Keep procedure site clean & dry. If you notice increased pain, swelling, bleeding or pus, call/return!  You may shower, but no soaking baths/hot tubs/pools for 1 week.

## 2016-06-11 NOTE — Progress Notes (Signed)
Site area: right groin Site Prior to Removal:  Level 0 Pressure Applied For:  20 minutes Manual:   yes Patient Status During Pull:  stable Post Pull Site:  Level 0 Post Pull Instructions Given:  yes Post Pull Pulses Present:  yes Dressing Applied:  Gauze/tegaderm Bedrest begins @  Comments:

## 2016-06-11 NOTE — Anesthesia Procedure Notes (Signed)
Procedure Name: Intubation Date/Time: 06/11/2016 8:02 AM Performed by: Shirlyn Goltz Pre-anesthesia Checklist: Patient identified, Emergency Drugs available, Patient being monitored and Suction available Patient Re-evaluated:Patient Re-evaluated prior to inductionOxygen Delivery Method: Circle system utilized Preoxygenation: Pre-oxygenation with 100% oxygen Intubation Type: IV induction Ventilation: Mask ventilation without difficulty, Oral airway inserted - appropriate to patient size and Two handed mask ventilation required Laryngoscope Size: McGraph and 4 Grade View: Grade I Tube type: Oral Tube size: 7.0 mm Number of attempts: 1 Airway Equipment and Method: Stylet and Video-laryngoscopy Placement Confirmation: ETT inserted through vocal cords under direct vision,  positive ETCO2 and breath sounds checked- equal and bilateral Secured at: 22 cm Tube secured with: Tape Dental Injury: Teeth and Oropharynx as per pre-operative assessment  Difficulty Due To: Difficulty was anticipated, Difficult Airway- due to large tongue and Difficult Airway- due to reduced neck mobility

## 2016-06-11 NOTE — Transfer of Care (Signed)
Immediate Anesthesia Transfer of Care Note  Patient: Christopher Wade  Procedure(s) Performed: Procedure(s): Atrial Fibrillation Ablation (N/A)  Patient Location: PACU and Cath Lab  Anesthesia Type:General  Level of Consciousness: awake, alert , oriented and patient cooperative  Airway & Oxygen Therapy: Patient Spontanous Breathing and Patient connected to nasal cannula oxygen  Post-op Assessment: Report given to RN and Post -op Vital signs reviewed and stable  Post vital signs: Reviewed and stable  Last Vitals:  Vitals:   06/11/16 0542 06/11/16 1102  BP: (!) 182/83   Pulse: 69   Resp: 18   Temp: 36.5 C 36.6 C    Last Pain:  Vitals:   06/11/16 1102  TempSrc: Temporal      Patients Stated Pain Goal: 5 (0000000 [id number])  Complications: No apparent anesthesia complications

## 2016-06-12 ENCOUNTER — Telehealth: Payer: Self-pay | Admitting: Cardiology

## 2016-06-12 DIAGNOSIS — G4733 Obstructive sleep apnea (adult) (pediatric): Secondary | ICD-10-CM | POA: Diagnosis not present

## 2016-06-12 DIAGNOSIS — I2510 Atherosclerotic heart disease of native coronary artery without angina pectoris: Secondary | ICD-10-CM | POA: Diagnosis not present

## 2016-06-12 DIAGNOSIS — I10 Essential (primary) hypertension: Secondary | ICD-10-CM | POA: Diagnosis not present

## 2016-06-12 DIAGNOSIS — Z6836 Body mass index (BMI) 36.0-36.9, adult: Secondary | ICD-10-CM | POA: Diagnosis not present

## 2016-06-12 DIAGNOSIS — E785 Hyperlipidemia, unspecified: Secondary | ICD-10-CM | POA: Diagnosis not present

## 2016-06-12 DIAGNOSIS — E669 Obesity, unspecified: Secondary | ICD-10-CM | POA: Diagnosis not present

## 2016-06-12 DIAGNOSIS — I481 Persistent atrial fibrillation: Secondary | ICD-10-CM

## 2016-06-12 DIAGNOSIS — I480 Paroxysmal atrial fibrillation: Secondary | ICD-10-CM | POA: Diagnosis not present

## 2016-06-12 LAB — GLUCOSE, CAPILLARY: Glucose-Capillary: 145 mg/dL — ABNORMAL HIGH (ref 65–99)

## 2016-06-12 MED ORDER — POTASSIUM CHLORIDE CRYS ER 20 MEQ PO TBCR: 20.0000 meq | EXTENDED_RELEASE_TABLET | Freq: Every day | ORAL | 0 refills | Status: DC

## 2016-06-12 NOTE — Telephone Encounter (Signed)
REVIEWED PT'S  CHART NOT SURE WHY A MESSAGE  WOULD HAVE  BEEN LEFT  COULD HAVE BEEN OLD MESSAGE  . INFORMED  PT  WILL CALL BACK IF  FIND  OUT  WHY CALL WAS  MADE OR PT   TO CALL BACK IF  FIGURES OUT  MESSAGE .Christopher Wade

## 2016-06-12 NOTE — Telephone Encounter (Signed)
Follow Up:    Returning call,does not know who called him.

## 2016-06-13 ENCOUNTER — Telehealth: Payer: Self-pay | Admitting: Cardiology

## 2016-06-13 NOTE — Telephone Encounter (Signed)
Pt called reporting he been spitting up small amount of blood tinged sputum this morning. Recently had an ablation last week and has felt well since that time. Currently on Xarelto. Denies any blood in the urine or stool. No chest pain, or dyspnea. Given this was a small amount I advised that he continue with current medications and monitor the bleeding, if it should become worse then should seek out medical treatment.   Reino Bellis NP-c

## 2016-06-18 ENCOUNTER — Telehealth: Payer: Self-pay

## 2016-06-18 NOTE — Telephone Encounter (Signed)
-----   Message from Vicie Mutters, [place] sent at 06/18/2016  4:40 PM EST ----- Regarding: RE: constipation Please do the following: Purchase a bottle of Miralax over the counter as well as a box of 5 mg dulcolax tablets. Take 4 dulcolax tablets. Wait 1 hour. You will then drink 6-8 capfuls of Miralax mixed in an adequate amount of water/gatorade (you may choose which of these liquids to drink) over the next 2-3 hours. You should expect results within 1 to 6 hours after completing the bowel purge. Go to the er if you have severe AB pain, can not pass gas or stool in over 12 hours, can not hold down any food.   ----- Message ----- From: Elenor Quinones, CMA Sent: 06/18/2016   3:42 PM To: Vicie Mutters, PA-C Subject: constipation                                   8 days since last bowel movement. Has been constipated since having heart ablation last week   Please advise.

## 2016-06-18 NOTE — Telephone Encounter (Signed)
PT WAS INFORMED TO GET ON MIRALAX & DULCOLAX  HE STATES HE HAS DULCOLAX ALREADY  ADVISE PT TO CALL IN THE AM & LET us KNOW IF THAT WORKED.  ADVISE TO GO TO ER IF SXS WORSEN.

## 2016-06-22 ENCOUNTER — Telehealth: Payer: Self-pay | Admitting: Cardiology

## 2016-06-22 NOTE — Telephone Encounter (Signed)
Patient was in office to drop off FMLA forms and had questions regarding nosebleeds Consulted with M. Supple, RPH prior to speaking with patient, who verified that he is on correct dose of xarelto She recommended saline rinse for nasal passages and humidifier and ENT eval if issue persists  Spoke with patient who reports his nosebleeds are not that bad and not every day and do not last for long periods of time. Explained that he is on xarelto and plavix which can exacerbate bleeding but do not necessarily cause it. Explained that each med serves a different purpose for his conditions. Explained that he should not stop either medication. Advised patient on Megan's recommendations - he voiced understanding.   Routed to MD as Juluis Rainier.

## 2016-06-24 LAB — PROTIME-INR
INR: 1.1 (ref 0.8–1.2)
Prothrombin Time: 11.6 s (ref 9.1–12.0)

## 2016-06-24 LAB — SPECIMEN STATUS REPORT

## 2016-06-30 ENCOUNTER — Telehealth (HOSPITAL_COMMUNITY): Payer: Self-pay | Admitting: *Deleted

## 2016-06-30 NOTE — Telephone Encounter (Signed)
Patient given detailed instructions per Myocardial Perfusion Study Information Sheet for the test on 07/02/16 at 1000. Patient notified to arrive 15 minutes early and that it is imperative to arrive on time for appointment to keep from having the test rescheduled.  If you need to cancel or reschedule your appointment, please call the office within 24 hours of your appointment. Failure to do so may result in a cancellation of your appointment, and a $50 no show fee. Patient verbalized understanding.Airelle Everding, Ranae [place]

## 2016-07-02 ENCOUNTER — Ambulatory Visit (HOSPITAL_COMMUNITY): Payer: Medicare Other | Attending: Cardiology

## 2016-07-02 DIAGNOSIS — I25709 Atherosclerosis of coronary artery bypass graft(s), unspecified, with unspecified angina pectoris: Secondary | ICD-10-CM | POA: Diagnosis not present

## 2016-07-02 DIAGNOSIS — R931 Abnormal findings on diagnostic imaging of heart and coronary circulation: Secondary | ICD-10-CM

## 2016-07-02 LAB — MYOCARDIAL PERFUSION IMAGING
LV dias vol: 141 mL (ref 62–150)
LV sys vol: 55 mL
Peak HR: 70 {beats}/min
RATE: 0.35
Rest HR: 58 {beats}/min
SDS: 1
SRS: 3
SSS: 4
TID: 1.09

## 2016-07-02 MED ORDER — TECHNETIUM TC 99M TETROFOSMIN IV KIT
10.2000 | PACK | Freq: Once | INTRAVENOUS | Status: AC | PRN
  Administered 2016-07-02: 10.2 via INTRAVENOUS
  Filled 2016-07-02: qty 11

## 2016-07-02 MED ORDER — REGADENOSON 0.4 MG/5ML IV SOLN
0.4000 mg | Freq: Once | INTRAVENOUS | Status: AC
  Administered 2016-07-02: 0.4 mg via INTRAVENOUS

## 2016-07-02 MED ORDER — TECHNETIUM TC 99M TETROFOSMIN IV KIT
32.4000 | PACK | Freq: Once | INTRAVENOUS | Status: AC | PRN
  Administered 2016-07-02: 32.4 via INTRAVENOUS
  Filled 2016-07-02: qty 33

## 2016-07-05 ENCOUNTER — Other Ambulatory Visit: Payer: Self-pay | Admitting: Physician Assistant

## 2016-07-07 ENCOUNTER — Ambulatory Visit (INDEPENDENT_AMBULATORY_CARE_PROVIDER_SITE_OTHER)
Admission: RE | Admit: 2016-07-07 | Discharge: 2016-07-07 | Disposition: A | Discharge status: Home / Self Care | Payer: Medicare Other | Source: Ambulatory Visit | Attending: Internal Medicine | Admitting: Internal Medicine

## 2016-07-07 DIAGNOSIS — R0600 Dyspnea, unspecified: Secondary | ICD-10-CM | POA: Diagnosis not present

## 2016-07-10 ENCOUNTER — Telehealth: Payer: Self-pay | Admitting: Internal Medicine

## 2016-07-10 NOTE — Telephone Encounter (Signed)
Mr. Gilford is returning your call. He asks that you call back on this no# [phone]

## 2016-07-10 NOTE — Telephone Encounter (Signed)
lmtcb

## 2016-07-10 NOTE — Telephone Encounter (Signed)
Dr Curt Bears: your PA is seeing this aptient Christopher Wade 07/15/16 - he has definite ILD. Please look into dc amiodarone  Elise: please order Pre-bd spiro and dlco only. No lung volume or bd response. -  at time of return visit 07/31/16. Iwant to see if there is progerssion. Let patient know I will discuss with him details of ct aand pft at [facility]  Thanks  Dr. Brand Males, M.D., [facility].C.P Pulmonary and Critical Care Medicine Staff Physician [facility] Pager: [phone], If no answer or between  15:00h - 7:00h: call [phone]  07/10/2016 3:35 AM       Ct Chest High Resolution  Result Date: 07/07/2016 CLINICAL DATA:  Chronic dyspnea. Follow-up possible interstitial lung disease. EXAM: CT CHEST WITHOUT CONTRAST TECHNIQUE: Multidetector CT imaging of the chest was performed following the standard protocol without intravenous contrast. High resolution imaging of the lungs, as well as inspiratory and expiratory imaging, was performed. COMPARISON:  01/23/2016 high-resolution chest CT. FINDINGS: Cardiovascular: Stable mild cardiomegaly. No significant pericardial fluid/thickening. Left main, left anterior descending, left circumflex and right coronary atherosclerosis. Atherosclerotic nonaneurysmal thoracic aorta. Stable top-normal size pulmonary arteries (main pulmonary artery diameter 3.0 cm). Mediastinum/Nodes: No discrete thyroid nodules. Unremarkable esophagus. No pathologically enlarged axillary, mediastinal or gross hilar lymph nodes, noting limited sensitivity for the detection of hilar adenopathy on this noncontrast study. Lungs/Pleura: No pneumothorax. No pleural effusion. Stable calcified 2-3 mm subpleural granulomas in the posterior right lower lobe. No acute consolidative airspace disease, lung masses or significant pulmonary nodules. Patchy subpleural reticulation and minimal ground-glass attenuation in both lungs with a basilar predominance,  not appreciably changed. No significant traction bronchiectasis or architectural distortion. No frank honeycombing. No significant air trapping on the expiration sequence. Upper abdomen: Small hiatal hernia. Musculoskeletal: No aggressive appearing focal osseous lesions. Moderate thoracic spondylosis . IMPRESSION: 1. No appreciable interval change in basilar predominant patchy subpleural reticulation and minimal ground-glass attenuation. No significant traction bronchiectasis. No frank honeycombing. Findings are compatible with a nonprogressive interstitial lung disease such as nonspecific interstitial pneumonia (NSIP). The lack of interval progression is not consistent with usual interstitial pneumonia (UIP). 2. Aortic atherosclerosis. Left main and 3 vessel coronary atherosclerosis. 3. Stable mild cardiomegaly. 4. Small hiatal hernia. Electronically Signed   By: Ilona Sorrel M.D.   On: 07/07/2016 16:35

## 2016-07-10 NOTE — Telephone Encounter (Signed)
Instructed patient to d/c Amiodarone secondary to lung toxicity.  Informed him that we would discuss options at his OV w/ Roderic Palau, NP in the AFib clinic next week. Advised to call office if he experiences issues due to AFib before his appt next week. Patient verbalized understanding and agreeable to plan.

## 2016-07-10 NOTE — Telephone Encounter (Signed)
Will [name], MD [phone])  Sent: Fri July 10, 2016 7:19 AM  To: Stanton Kidney, RN            Message   Can we have him stop his amiodarone, looks like he's having lung toxicity from it. Thanks!

## 2016-07-13 NOTE — Telephone Encounter (Signed)
lmtcb for pt.  

## 2016-07-15 ENCOUNTER — Encounter (HOSPITAL_COMMUNITY): Payer: Self-pay | Admitting: Nurse Practitioner

## 2016-07-15 ENCOUNTER — Ambulatory Visit (HOSPITAL_COMMUNITY)
Admission: RE | Admit: 2016-07-15 | Discharge: 2016-07-15 | Disposition: A | Discharge status: Home / Self Care | Payer: Medicare Other | Source: Ambulatory Visit | Attending: Nurse Practitioner | Admitting: Nurse Practitioner

## 2016-07-15 VITALS — BP 148/86 | HR 60 | Ht 71.0 in | Wt 251.2 lb

## 2016-07-15 DIAGNOSIS — I2510 Atherosclerotic heart disease of native coronary artery without angina pectoris: Secondary | ICD-10-CM | POA: Diagnosis not present

## 2016-07-15 DIAGNOSIS — Z955 Presence of coronary angioplasty implant and graft: Secondary | ICD-10-CM | POA: Diagnosis not present

## 2016-07-15 DIAGNOSIS — Z801 Family history of malignant neoplasm of trachea, bronchus and lung: Secondary | ICD-10-CM | POA: Insufficient documentation

## 2016-07-15 DIAGNOSIS — Z800 Family history of malignant neoplasm of digestive organs: Secondary | ICD-10-CM | POA: Diagnosis not present

## 2016-07-15 DIAGNOSIS — I481 Persistent atrial fibrillation: Secondary | ICD-10-CM

## 2016-07-15 DIAGNOSIS — Z7901 Long term (current) use of anticoagulants: Secondary | ICD-10-CM | POA: Insufficient documentation

## 2016-07-15 DIAGNOSIS — Z888 Allergy status to other drugs, medicaments and biological substances status: Secondary | ICD-10-CM | POA: Insufficient documentation

## 2016-07-15 DIAGNOSIS — Z9889 Other specified postprocedural states: Secondary | ICD-10-CM | POA: Insufficient documentation

## 2016-07-15 DIAGNOSIS — I480 Paroxysmal atrial fibrillation: Secondary | ICD-10-CM | POA: Insufficient documentation

## 2016-07-15 DIAGNOSIS — J849 Interstitial pulmonary disease, unspecified: Secondary | ICD-10-CM | POA: Diagnosis not present

## 2016-07-15 DIAGNOSIS — Z96643 Presence of artificial hip joint, bilateral: Secondary | ICD-10-CM | POA: Diagnosis not present

## 2016-07-15 DIAGNOSIS — I10 Essential (primary) hypertension: Secondary | ICD-10-CM | POA: Insufficient documentation

## 2016-07-15 DIAGNOSIS — E039 Hypothyroidism, unspecified: Secondary | ICD-10-CM | POA: Diagnosis not present

## 2016-07-15 DIAGNOSIS — E785 Hyperlipidemia, unspecified: Secondary | ICD-10-CM | POA: Diagnosis not present

## 2016-07-15 DIAGNOSIS — Z79899 Other long term (current) drug therapy: Secondary | ICD-10-CM | POA: Diagnosis not present

## 2016-07-15 DIAGNOSIS — E119 Type 2 diabetes mellitus without complications: Secondary | ICD-10-CM | POA: Diagnosis not present

## 2016-07-15 DIAGNOSIS — Z7984 Long term (current) use of oral hypoglycemic drugs: Secondary | ICD-10-CM | POA: Diagnosis not present

## 2016-07-15 DIAGNOSIS — M109 Gout, unspecified: Secondary | ICD-10-CM | POA: Insufficient documentation

## 2016-07-15 DIAGNOSIS — E559 Vitamin D deficiency, unspecified: Secondary | ICD-10-CM | POA: Diagnosis not present

## 2016-07-15 DIAGNOSIS — I4819 Other persistent atrial fibrillation: Secondary | ICD-10-CM

## 2016-07-15 DIAGNOSIS — G4733 Obstructive sleep apnea (adult) (pediatric): Secondary | ICD-10-CM | POA: Insufficient documentation

## 2016-07-15 DIAGNOSIS — I4891 Unspecified atrial fibrillation: Secondary | ICD-10-CM | POA: Diagnosis present

## 2016-07-16 NOTE — Progress Notes (Signed)
Primary Care Physician: Alesia Richards, MD Referring Physician: Dr. Eda Keys is a 66 y.o. male with a h/o afib s/p ablation 06/01/16. He reports today that he has not had any afib since surgery. He also reports that he has a CT of the chest which showed early lung toxicity for amiodarone. He has stopped amiodarone a few days ago. 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 He denies any swallowing issues. He sees pulmonology 3/7 for further evaluation. He continues with mild shortness of breqth.  Today, he denies symptoms of palpitations, chest pain, orthopnea, PND, lower extremity edema, dizziness, presyncope, syncope, or neurologic sequela. The patient is tolerating medications without difficulties and is otherwise without complaint today.   Past Medical History:  Diagnosis Date  . Arthritis    "joints get stiff" (06/11/2016)  . Bee sting allergy   . CAD (coronary artery disease), native coronary artery 02/12/2016   cath showing 50% LM, 40% distal LAD, 70% ost to prox LCs, 40% mid LAD and 95% mid to dist RCA s/p PCI with DES x 2 and stage PCI of the LCx/OM.  [name] History of gout   . Hyperlipidemia   . Hypertension   . Hypothyroidism   . OSA (obstructive sleep apnea)    "tried CPAP; couldn't do it" (06/11/2016)  . PAF (paroxysmal atrial fibrillation) ([place])    s/p DCCV 03/2015 with reversion back to atrial fibrillation, loaded with Amio and s/p DCCV to NSR 11/2015  . Thyroid disease   . Type II diabetes mellitus ([place])   . Vitamin D deficiency    Past Surgical History:  Procedure Laterality Date  . CARDIAC CATHETERIZATION N/A 10/11/2015   Procedure: Left Heart Cath and Coronary Angiography;  Surgeon: [name], MD;  Location: [place] [facility];  Service: Cardiovascular;  Laterality: N/A;  . CARDIAC CATHETERIZATION N/A 10/11/2015   Procedure: Coronary Stent  Intervention;  Surgeon: [name], MD;  Location: [facility] [facility];  Service: Cardiovascular;  Laterality: N/A;  . CARDIAC CATHETERIZATION N/A 10/11/2015   Procedure: Intravascular Pressure Wire/FFR Study;  Surgeon: [name], MD;  Location: [place] [facility];  Service: Cardiovascular;  Laterality: N/A;  . CARDIAC CATHETERIZATION N/A 10/14/2015   Procedure: Coronary Stent Intervention;  Surgeon: [name], MD;  Location: [place];  Service: Cardiovascular;  Laterality: N/A;  . CARDIOVERSION N/A 04/05/2015   Procedure: CARDIOVERSION;  Surgeon: [name], MD;  Location: [facility];  Service: Cardiovascular;  Laterality: N/A;  . CARDIOVERSION N/A 12/09/2015   Procedure: CARDIOVERSION;  Surgeon: [name], MD;  Location: [facility];  Service: Cardiovascular;  Laterality: N/A;  . CARDIOVERSION N/A 03/16/2016   Procedure: CARDIOVERSION;  Surgeon: [name], MD;  Location: [facility];  Service: Cardiovascular;  Laterality: N/A;  . CORONARY ANGIOPLASTY    . ELECTROPHYSIOLOGIC STUDY N/A 06/11/2016   Procedure: Atrial Fibrillation Ablation;  Surgeon: Will [name], MD;  Location: [place] [facility];  Service: Cardiovascular;  Laterality: N/A;  . JOINT REPLACEMENT    . TOTAL HIP ARTHROPLASTY Bilateral 2001  . UVULECTOMY      Current Outpatient Prescriptions  Medication Sig Dispense Refill  . allopurinol (ZYLOPRIM) 300 MG tablet take 1 tablet by mouth once daily (Patient taking differently: Take 150mg  daily in the morning) 30 tablet 2  . amLODipine (NORVASC) 5 MG tablet Take  1 tablet (5 mg total) by mouth daily. 30 tablet 11  . atenolol (TENORMIN) 25 MG tablet Take 1 tablet (25 mg total) by mouth daily. 90 tablet 3  . atorvastatin (LIPITOR) 80 MG tablet take 1 tablet by mouth once daily AT 6PM 90 tablet 0  . B Complex Vitamins (B COMPLEX PO) Take 1 tablet by mouth daily.    . cholecalciferol (VITAMIN D) 1000 UNITS tablet Take 1,000 Units by  mouth daily.    . clopidogrel (PLAVIX) 75 MG tablet Take 1 tablet (75 mg total) by mouth daily with breakfast. 30 tablet 11  . colchicine 0.6 MG tablet take 1 tablet by mouth once daily TO PREVENT GOUT (Patient taking differently: Take 0.6mg  once daily as needed for gout flare) 90 tablet 0  . HYDROcodone-acetaminophen (NORCO/VICODIN) 5-325 MG tablet Take 1 tablet by mouth every 6 (six) hours as needed for moderate pain. 60 tablet 0  . levothyroxine (SYNTHROID, LEVOTHROID) 75 MCG tablet take 1 tablet by mouth once daily before BREAKFAST 90 tablet 1  . loratadine (CLARITIN) 10 MG tablet Take 10 mg by mouth daily.    . magnesium gluconate (MAGONATE) 500 MG tablet Take 1,000 mg by mouth 2 (two) times daily.     . metFORMIN (GLUCOPHAGE) 500 MG tablet Take 1 to 2 tablets 2 x / day with food as directed for Diabetes 360 tablet 1  . nystatin cream (MYCOSTATIN) Apply 1 application topically 2 (two) times daily as needed (wound care). 30 g 1  . valsartan (DIOVAN) 320 MG tablet take 1 tablet by mouth once daily 90 tablet 0  . XARELTO 20 MG TABS tablet take 1 tablet by mouth once daily WITH SUPPER 30 tablet 4  . potassium chloride SA (KLOR-CON M20) 20 MEQ tablet Take 1 tablet (20 mEq total) by mouth daily. For 7 days 30 tablet 0   No current facility-administered medications for this encounter.     Allergies  Allergen Reactions  . Bee Venom Other (See Comments)    Leg went numb    Social History   Social History  . Marital status: Single    Spouse name: N/A  . Number of children: N/A  . Years of education: N/A   Occupational History  . Not on file.   Social History Main Topics  . Smoking status: Never Smoker  . Smokeless tobacco: Never Used  . Alcohol use No  . Drug use: No  . Sexual activity: Not Currently   Other Topics Concern  . Not on file   Social History Narrative   Architect     Family History  Problem Relation Age of Onset  . Lung cancer Mother   . Lung cancer Father    . Colon cancer Sister     ROS- All systems are reviewed and negative except as per the HPI above  Physical Exam: Vitals:   07/15/16 1452  BP: (!) 148/86  Pulse: 60  Weight: 251 lb 3.2 oz (113.9 kg)  Height: 5\' 11"  (1.803 m)   Wt Readings from Last 3 Encounters:  07/15/16 251 lb 3.2 oz (113.9 kg)  06/12/16 258 lb 2.5 oz (117.1 kg)  06/01/16 255 lb 12.8 oz (116 kg)    Labs: Lab Results  Component Value Date   NA 138 06/11/2016   K 3.4 (L) 06/11/2016   CL 98 (L) 06/11/2016   CO2 25 06/11/2016   GLUCOSE 127 (H) 06/11/2016   BUN 15 06/11/2016   CREATININE 1.29 (H) 06/11/2016  CALCIUM 9.4 06/11/2016   MG 1.8 05/21/2016   Lab Results  Component Value Date   INR WILL FOLLOW 06/01/2016   INR 1.1 06/01/2016   Lab Results  Component Value Date   CHOL 113 05/21/2016   HDL 48 05/21/2016   LDLCALC 41 05/21/2016   TRIG 120 05/21/2016     GEN- The patient is well appearing, alert and oriented x 3 today.   Head- normocephalic, atraumatic Eyes-  Sclera clear, conjunctiva pink Ears- hearing intact Oropharynx- clear Neck- supple, no JVP Lymph- no cervical lymphadenopathy Lungs- Clear to ausculation bilaterally, normal work of breathing Heart- Regular rate and rhythm, no murmurs, rubs or gallops, PMI not laterally displaced GI- soft, NT, ND, + BS Extremities- no clubbing, cyanosis, or edema MS- no significant deformity or atrophy Skin- no rash or lesion Psych- euthymic mood, full affect Neuro- strength and sensation are intact  EKG-NSR at 60 bpm, pr int 174 ms, qrs itn 94 ms, qtc 482 ms Epic records  CT of chest- 07/07/16- IMPRESSION: 1. No appreciable interval change in basilar predominant patchy subpleural reticulation and minimal ground-glass attenuation. No significant traction bronchiectasis. No frank honeycombing. Findings are compatible with a nonprogressive interstitial lung disease such as nonspecific interstitial pneumonia (NSIP). The lack of  interval progression is not consistent with usual interstitial pneumonia (UIP). 2. Aortic atherosclerosis. Left main and 3 vessel coronary atherosclerosis. 3. Stable mild cardiomegaly. 4. Small hiatal hernia.reviewed    Assessment and Plan: 1. afib Maintaining SR since ablation Off amiodarone recently for findings of interstitial  lung disease by CT Continue atenolol Continue xarelto Work note given to be out of work until evaluated by pulmonology with his c/o being short of breath, he works in a very physical type job in remodeling  F/u Pulmonary 3/7 F/u Fransico Him, MD, 3/22 F/u Dr. Curt Bears 4/19 afib clinic as needed

## 2016-07-17 NOTE — Telephone Encounter (Signed)
lmtcb for pt.  

## 2016-07-20 ENCOUNTER — Telehealth: Payer: Self-pay | Admitting: Internal Medicine

## 2016-07-20 ENCOUNTER — Encounter: Payer: Self-pay | Admitting: Emergency Medicine

## 2016-07-20 NOTE — Telephone Encounter (Signed)
lmtcb for pt. Will sign off on message, per protocol, due to several unsuccessful attempts to reach pt, a letter has been mailed to pt's home address.   Will forward to MR as FYI.

## 2016-07-20 NOTE — Telephone Encounter (Signed)
Spoke with pt. States that he received a call from [place] on Friday. There is no documentation of this. Pt states that there was no message left. Advised pt that I do not see where anyone from our office called him. Nothing further was needed.

## 2016-07-29 ENCOUNTER — Encounter: Payer: Self-pay | Admitting: Emergency Medicine

## 2016-07-29 ENCOUNTER — Ambulatory Visit (INDEPENDENT_AMBULATORY_CARE_PROVIDER_SITE_OTHER): Payer: Medicare Other | Admitting: Internal Medicine

## 2016-07-29 ENCOUNTER — Encounter: Payer: Self-pay | Admitting: Internal Medicine

## 2016-07-29 DIAGNOSIS — J849 Interstitial pulmonary disease, unspecified: Secondary | ICD-10-CM | POA: Diagnosis not present

## 2016-07-29 DIAGNOSIS — I25709 Atherosclerosis of coronary artery bypass graft(s), unspecified, with unspecified angina pectoris: Secondary | ICD-10-CM | POA: Diagnosis not present

## 2016-07-29 NOTE — Patient Instructions (Signed)
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 No lung volume or bd response.  - given normal walking test without oxygen drop and improved symptoms off amiodarone you can resume normal work -take work note Followup  - return to see me after above

## 2016-07-29 NOTE — Progress Notes (Signed)
Subjective:     Patient ID: Christopher Wade, male   DOB: 1951-02-11, 66 y.o.   MRN: [id number]  HPI   IOV 01/13/2016  Chief Complaint  Patient presents with  . Pulmonary Consult    Pt referred by Dr. Fransico Him for abnormal PFT. Pt states after heavy exertion he becomes SOB. Pt denies cough, CP/tightness, f/c/s.    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 History is obtained from him and review of the chart. He says approximately 6 months ago started noticing dyspnea on exertion of moderate intensity relieved by rest without associated chest pain or wheezing or cough. He was diagnosed to have atrial fibrillation. Apparently has failed cardioversion. According to chart review he at first cardioversion in November 2016 with success. Then it appears in the spring 2017) cardiac stress test which was abnormal and then he had right coronary artery stenting 2 and also PTCA of the left circumflex. Restarted on Xarelto 10/16/2015 and was on triple therapy with aspirin and Plavix and Xarelto. He stopped aspirin 11/14/2015. He said that this interventions his dyspnea did improve. It looks like is able to relation came back and he underwent second cardioversion July 2017. According to his history is been loaded on amiodarone since then with the possibility of third cardioversion being planned. He did not Pulmicort function test in June 2017 and according to his history was before the amiodarone he did this was abnormal and therefore he is been referred here. Currently overall dyspnea is improved but he still has it for heavy exertion and this is not normal for him   11/11/2015 pulmonary function test: FVC 2.4 L/71% ratio of 99. Postbronchodilator FVC 2.57 L/74%. Total lung capacity 5.42 L/75% fits in with restriction. DLCO 18.34/54% with a normal hemoglobin in July 2017 of 15.2 g percent personally visualized the PFT  Walking desaturation test 185 feet 3 laps and rheumatic: Did not desaturate  below 90%.    reports that he has never smoked. He has never used smokeless tobacco.  OV 07/29/2016    Chief Complaint  Patient presents with  . Follow-up    HRCT done 07/07/2016. Pt states that his breathing has been doing well since stopping the Amioderone.     66 year old male with interstitial lung disease in the setting of amiodarone therapy and also construction work  Last visit was in summer 2017. Followed up with a high-resolution CT chest in February 2018 that definitely showed presence of ILD non--UIP pattern but possible UIP. I discussion with his cardiologist E-. physiologist and his amiodarone was stopped and maybe a few weeks ago. He says after that he is feeling much better. His dyspnea is improved. His overall sense of fatigue everything is improved. He is also status post ablation in January 2018. 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 He has no symptoms.   Walking desaturation test on 07/29/2016 185 feet x 3 laps:  did not  desaturateb below 90% ; similar to past    has a past medical history of Arthritis; Bee sting allergy; CAD (coronary artery disease), native coronary artery (02/12/2016); History of gout; Hyperlipidemia; Hypertension; Hypothyroidism; OSA (obstructive sleep apnea); PAF (paroxysmal atrial fibrillation) ([place]); Thyroid disease; Type II diabetes mellitus ([place]); and Vitamin D deficiency.   reports that he has never smoked. He has never used smokeless tobacco.  Past Surgical History:  Procedure Laterality Date  . CARDIAC CATHETERIZATION N/A 10/11/2015   Procedure: Left Heart Cath and Coronary Angiography;  Surgeon: [name],  MD;  Location: [place] [facility];  Service: Cardiovascular;  Laterality: N/A;  . CARDIAC CATHETERIZATION N/A 10/11/2015   Procedure: Coronary Stent Intervention;  Surgeon: [name], MD;  Location: [place] [facility];  Service: Cardiovascular;  Laterality: N/A;  . CARDIAC CATHETERIZATION N/A 10/11/2015   Procedure:  Intravascular Pressure Wire/FFR Study;  Surgeon: [name], MD;  Location: [place] [facility];  Service: Cardiovascular;  Laterality: N/A;  . CARDIAC CATHETERIZATION N/A 10/14/2015   Procedure: Coronary Stent Intervention;  Surgeon: [name], MD;  Location: [facility];  Service: Cardiovascular;  Laterality: N/A;  . CARDIOVERSION N/A 04/05/2015   Procedure: CARDIOVERSION;  Surgeon: [name], MD;  Location: [facility];  Service: Cardiovascular;  Laterality: N/A;  . CARDIOVERSION N/A 12/09/2015   Procedure: CARDIOVERSION;  Surgeon: [name], MD;  Location: [facility];  Service: Cardiovascular;  Laterality: N/A;  . CARDIOVERSION N/A 03/16/2016   Procedure: CARDIOVERSION;  Surgeon: [name], MD;  Location: [facility];  Service: Cardiovascular;  Laterality: N/A;  . CORONARY ANGIOPLASTY    . ELECTROPHYSIOLOGIC STUDY N/A 06/11/2016   Procedure: Atrial Fibrillation Ablation;  Surgeon: Will [name], MD;  Location: [place] [facility];  Service: Cardiovascular;  Laterality: N/A;  . JOINT REPLACEMENT    . TOTAL HIP ARTHROPLASTY Bilateral 2001  . UVULECTOMY      Allergies  Allergen Reactions  . Amiodarone Other (See Comments)    Lung toxicity  . Bee Venom Other (See Comments)    Leg went numb    Immunization History  Administered Date(s) Administered  . Influenza Split 04/05/2014  . Influenza-Unspecified 02/02/2013, 05/21/2016  . Pneumococcal Conjugate-13 05/01/2014  . Pneumococcal Polysaccharide-23 05/16/2015  . Td 04/25/2010  . Varicella Zoster Immune Globulin 02/22/2014    Family History  Problem Relation Age of Onset  . Lung cancer Mother   . Lung cancer Father   . Colon cancer Sister      Current Outpatient Prescriptions:  .  allopurinol (ZYLOPRIM) 300 MG tablet, Take 150 mg by mouth daily., Disp: , Rfl:  .  amLODipine (NORVASC) 5 MG tablet, Take 1 tablet (5 mg total) by mouth daily., Disp: 30 tablet, Rfl: 11 .  atenolol  (TENORMIN) 25 MG tablet, Take 1 tablet (25 mg total) by mouth daily., Disp: 90 tablet, Rfl: 3 .  atorvastatin (LIPITOR) 80 MG tablet, take 1 tablet by mouth once daily AT 6PM, Disp: 90 tablet, Rfl: 0 .  B Complex Vitamins (B COMPLEX PO), Take 1 tablet by mouth daily., Disp: , Rfl:  .  cholecalciferol (VITAMIN D) 1000 UNITS tablet, Take 1,000 Units by mouth daily., Disp: , Rfl:  .  clopidogrel (PLAVIX) 75 MG tablet, Take 1 tablet (75 mg total) by mouth daily with breakfast., Disp: 30 tablet, Rfl: 11 .  colchicine 0.6 MG tablet, take 1 tablet by mouth once daily TO PREVENT GOUT (Patient taking differently: Take 0.6mg  once daily as needed for gout flare), Disp: 90 tablet, Rfl: 0 .  HYDROcodone-acetaminophen (NORCO/VICODIN) 5-325 MG tablet, Take 1 tablet by mouth every 6 (six) hours as needed for moderate pain., Disp: 60 tablet, Rfl: 0 .  levothyroxine (SYNTHROID, LEVOTHROID) 75 MCG tablet, take 1 tablet by mouth once daily before BREAKFAST, Disp: 90 tablet, Rfl: 1 .  loratadine (CLARITIN) 10 MG tablet, Take 10 mg by mouth daily., Disp: , Rfl:  .  magnesium gluconate (MAGONATE) 500 MG tablet, Take 1,000 mg by mouth 2 (two) times daily. , Disp: , Rfl:  .  metFORMIN (GLUCOPHAGE) 500 MG tablet, Take 1 to 2 tablets 2 x / day with food as directed for Diabetes, Disp: 360 tablet, Rfl: 1 .  nystatin cream (MYCOSTATIN), Apply 1 application topically 2 (two) times daily as needed (wound care)., Disp: 30 g, Rfl: 1 .  valsartan (DIOVAN) 320 MG tablet, take 1 tablet by mouth once daily, Disp: 90 tablet, Rfl: 0 .  XARELTO 20 MG TABS tablet, take 1 tablet by mouth once daily WITH SUPPER, Disp: 30 tablet, Rfl: 4 .  potassium chloride SA (KLOR-CON M20) 20 MEQ tablet, Take 1 tablet (20 mEq total) by mouth daily. For 7 days, Disp: 30 tablet, Rfl: 0    Review of Systems     Objective:   Physical Exam  Constitutional: He is oriented to person, place, and time. He appears well-developed and well-nourished. No  distress.  HENT:  Head: Normocephalic and atraumatic.  Right Ear: External ear normal.  Left Ear: External ear normal.  Mouth/Throat: Oropharynx is clear and moist. No oropharyngeal exudate.  Eyes: Conjunctivae and EOM are normal. Pupils are equal, round, and reactive to light. Right eye exhibits no discharge. Left eye exhibits no discharge. No scleral icterus.  Neck: Normal range of motion. Neck supple. No JVD present. No tracheal deviation present. No thyromegaly present.  Cardiovascular: Normal rate, regular rhythm and intact distal pulses.  Exam reveals no gallop and no friction rub.   No murmur heard. Pulmonary/Chest: Effort normal and breath sounds normal. No respiratory distress. He has no wheezes. He has no rales. He exhibits no tenderness.  ? Basal crackles - not sure  Abdominal: Soft. Bowel sounds are normal. He exhibits no distension and no mass. There is no tenderness. There is no rebound and no guarding.  Visceral obesity +  Musculoskeletal: Normal range of motion. He exhibits no edema or tenderness.  Lymphadenopathy:    He has no cervical adenopathy.  Neurological: He is alert and oriented to person, place, and time. He has normal reflexes. No cranial nerve deficit. Coordination normal.  Skin: Skin is warm and dry. No rash noted. He is not diaphoretic. No erythema. No pallor.  Psychiatric: He has a normal mood and affect. His behavior is normal. Judgment and thought content normal.  Nursing note and vitals reviewed.  Vitals:   07/29/16 1105  BP: (!) 148/92  Pulse: 60  SpO2: 96%  Weight: 254 lb (115.2 kg)  Height: 5\' 11"  (1.803 m)    Estimated body mass index is 35.43 kg/m as calculated from the following:   Height as of this encounter: 5\' 11"  (1.803 m).   Weight as of this encounter: 254 lb (115.2 kg).     Assessment:       ICD-9-CM ICD-10-CM   1. 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 I have cautioned him that typically amiodarone related interstitial lung disease is treated with prednisone but given the fact is better we'll support him expectantly. 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 No lung volume or bd response.  - given normal walking test without oxygen drop and improved symptoms off amiodarone you can resume normal work -take work note Followup  - return to see me after above    (> 50% of this 15 min visit spent in face to face counseling or/and coordination of care)  Dr. Brand Males, M.D., [facility].C.P Pulmonary and Critical Care Medicine Staff Physician [place] [facility] Pager: [phone], If no answer or between  15:00h - 7:00h: call [phone]  07/29/2016 11:45 AM

## 2016-07-29 NOTE — Assessment & Plan Note (Addendum)
Glad you are better after stoppping amiodarone esp in terms of your shortness of breath  Plan -  agree we will watch this - Do HRCT supine/prone 6 months from mid-feb 2018 - Also at same time do in 6 months do -Pre-bd spiro and dlco only. No lung volume or bd response.  - given normal walking test without oxygen drop and improved symptoms off amiodarone you can resume normal work -take work note Followup  - return to see me after above

## 2016-07-29 NOTE — Addendum Note (Signed)
Addended by: [name] on: 07/29/2016 12:00 PM   Modules accepted: Orders

## 2016-08-05 ENCOUNTER — Encounter: Payer: Self-pay | Admitting: Cardiology

## 2016-08-13 ENCOUNTER — Ambulatory Visit (INDEPENDENT_AMBULATORY_CARE_PROVIDER_SITE_OTHER): Payer: Medicare Other | Admitting: Cardiology

## 2016-08-13 ENCOUNTER — Encounter: Payer: Self-pay | Admitting: Cardiology

## 2016-08-13 VITALS — BP 132/86 | HR 62 | Ht 71.0 in | Wt 256.0 lb

## 2016-08-13 DIAGNOSIS — I2510 Atherosclerotic heart disease of native coronary artery without angina pectoris: Secondary | ICD-10-CM

## 2016-08-13 DIAGNOSIS — E1122 Type 2 diabetes mellitus with diabetic chronic kidney disease: Secondary | ICD-10-CM

## 2016-08-13 DIAGNOSIS — N182 Chronic kidney disease, stage 2 (mild): Secondary | ICD-10-CM

## 2016-08-13 DIAGNOSIS — I4819 Other persistent atrial fibrillation: Secondary | ICD-10-CM

## 2016-08-13 DIAGNOSIS — G4733 Obstructive sleep apnea (adult) (pediatric): Secondary | ICD-10-CM

## 2016-08-13 DIAGNOSIS — I481 Persistent atrial fibrillation: Secondary | ICD-10-CM | POA: Diagnosis not present

## 2016-08-13 DIAGNOSIS — E7800 Pure hypercholesterolemia, unspecified: Secondary | ICD-10-CM | POA: Diagnosis not present

## 2016-08-13 DIAGNOSIS — I10 Essential (primary) hypertension: Secondary | ICD-10-CM

## 2016-08-13 NOTE — Progress Notes (Signed)
Cardiology Office Note    Date:  08/16/2016   ID:  Christopher Wade, DOB [birth year], MRN [id number]  PCP:  Alesia Richards, MD  Cardiologist:  Fransico Him, MD   Chief Complaint  Patient presents with  . Atrial Fibrillation  . Hypertension  . Coronary Artery Disease  . Hyperlipidemia    History of Present Illness:  Christopher Wade is a 66 y.o. male with a history of atrial fibrillation s/p DCCV 03/2015 to NSR but before discharge he went back into afib.He was loaded with Amio and then he was set up to have DCCV which he successfully had done in July 2017. He ultimately underwent afib ablation 06/11/2016.  He was found to have early lung toxicity and his Amio was stopped recently.     He also has a history of HTN, dyslipidemia, DM, ASCAD cath showing 50% LM, 40% distal LAD, 70% ost to prox LCX, 40% mid LAD and 95% mid to dist RCA s/p PCI with DES x 2 and stage PCI of the LCx/OM.   He is doing well today.  He has not had any anginal chest pain since I saw him last.  He denies any SOB, palpitations, dizziness or syncope. He has chronic LE edema due to an ankle injury.He says that after stopping his Amio his SOB has significantly improved.  He says that he wakes up a night and has some bloody discharge in his throat.  He stopped his plavix for 4 days and the bleeding stopped.  He is now back on Plavix.   Past Medical History:  Diagnosis Date  . Arthritis    "joints get stiff" (06/11/2016)  . Bee sting allergy   . CAD (coronary artery disease), native coronary artery 02/12/2016   cath showing 50% LM, 40% distal LAD, 70% ost to prox LCs, 40% mid LAD and 95% mid to dist RCA s/p PCI with DES x 2 and stage PCI of the LCx/OM.  [name] History of gout   . Hyperlipidemia   . Hypertension   . Hypothyroidism   . OSA (obstructive sleep apnea)    "tried CPAP; couldn't do it" (06/11/2016)  . Persistent atrial fibrillation ([place])    s/p DCCV 03/2015 with reversion back to atrial fibrillation,  loaded with Amio and s/p DCCV to NSR 11/2015.  He is now s/p afib albation.  He had amio lung toxicity and amio was stopped.  . Thyroid disease   . Type II diabetes mellitus ([place])   . Vitamin D deficiency     Past Surgical History:  Procedure Laterality Date  . CARDIAC CATHETERIZATION N/A 10/11/2015   Procedure: Left Heart Cath and Coronary Angiography;  Surgeon: [name], MD;  Location: [place] [facility];  Service: Cardiovascular;  Laterality: N/A;  . CARDIAC CATHETERIZATION N/A 10/11/2015   Procedure: Coronary Stent Intervention;  Surgeon: [name], MD;  Location: [place] [facility];  Service: Cardiovascular;  Laterality: N/A;  . CARDIAC CATHETERIZATION N/A 10/11/2015   Procedure: Intravascular Pressure Wire/FFR Study;  Surgeon: [name], MD;  Location: [place] [facility];  Service: Cardiovascular;  Laterality: N/A;  . CARDIAC CATHETERIZATION N/A 10/14/2015   Procedure: Coronary Stent Intervention;  Surgeon: [name], MD;  Location: [place] [facility];  Service: Cardiovascular;  Laterality: N/A;  . CARDIOVERSION N/A 04/05/2015   Procedure: CARDIOVERSION;  Surgeon: [name], MD;  Location: [place] [facility];  Service: Cardiovascular;  Laterality: N/A;  . CARDIOVERSION N/A 12/09/2015   Procedure:  CARDIOVERSION;  Surgeon: [name], MD;  Location: [facility];  Service: Cardiovascular;  Laterality: N/A;  . CARDIOVERSION N/A 03/16/2016   Procedure: CARDIOVERSION;  Surgeon: [name], MD;  Location: [place];  Service: Cardiovascular;  Laterality: N/A;  . CORONARY ANGIOPLASTY    . ELECTROPHYSIOLOGIC STUDY N/A 06/11/2016   Procedure: Atrial Fibrillation Ablation;  Surgeon: Will [name], MD;  Location: [place] [facility];  Service: Cardiovascular;  Laterality: N/A;  . JOINT REPLACEMENT    . TOTAL HIP ARTHROPLASTY Bilateral 2001  . UVULECTOMY      Current Medications: Current Meds  Medication Sig  . allopurinol (ZYLOPRIM) 300 MG tablet Take  150 mg by mouth daily.  [name] amLODipine (NORVASC) 5 MG tablet Take 1 tablet (5 mg total) by mouth daily.  [name] atenolol (TENORMIN) 25 MG tablet Take 1 tablet (25 mg total) by mouth daily.  [name] atorvastatin (LIPITOR) 80 MG tablet take 1 tablet by mouth once daily AT 6PM  . B Complex Vitamins (B COMPLEX PO) Take 1 tablet by mouth daily.  . cholecalciferol (VITAMIN D) 1000 UNITS tablet Take 1,000 Units by mouth daily.  . clopidogrel (PLAVIX) 75 MG tablet Take 1 tablet (75 mg total) by mouth daily with breakfast.  . colchicine 0.6 MG tablet Take 0.6 mg by mouth as needed.  [name] HYDROcodone-acetaminophen (NORCO/VICODIN) 5-325 MG tablet Take 1 tablet by mouth every 6 (six) hours as needed for moderate pain.  [name] levothyroxine (SYNTHROID, LEVOTHROID) 75 MCG tablet take 1 tablet by mouth once daily before BREAKFAST  . loratadine (CLARITIN) 10 MG tablet Take 10 mg by mouth daily.  . magnesium gluconate (MAGONATE) 500 MG tablet Take 1,000 mg by mouth 2 (two) times daily.   . metFORMIN (GLUCOPHAGE) 500 MG tablet Take 1 to 2 tablets 2 x / day with food as directed for Diabetes  . nystatin cream (MYCOSTATIN) Apply 1 application topically 2 (two) times daily as needed (wound care).  . valsartan (DIOVAN) 320 MG tablet take 1 tablet by mouth once daily  . XARELTO 20 MG TABS tablet take 1 tablet by mouth once daily WITH SUPPER    Allergies:   Amiodarone and Bee venom   Social History   Social History  . Marital status: Single    Spouse name: N/A  . Number of children: N/A  . Years of education: N/A   Social History Main Topics  . Smoking status: Never Smoker  . Smokeless tobacco: Never Used  . Alcohol use No  . Drug use: No  . Sexual activity: Not Currently   Other Topics Concern  . None   Social History Animal nutritionist      Family History:  The patient's family history includes Colon cancer in his sister; Lung cancer in his father and mother.   ROS:   Please see the history of present  illness.    ROS All other systems reviewed and are negative.  No flowsheet data found.     PHYSICAL EXAM:   VS:  BP 132/86   Pulse 62   Ht 5\' 11"  (1.803 m)   Wt 256 lb (116.1 kg)   SpO2 93%   BMI 35.70 kg/m    GEN: Well nourished, well developed, in no acute distress  HEENT: normal  Neck: no JVD, carotid bruits, or masses Cardiac: RRR; no murmurs, rubs, or gallops,no edema.  Intact distal pulses bilaterally.  Respiratory:  clear to auscultation bilaterally, normal work of breathing GI: soft, nontender, nondistended, +  BS MS: no deformity or atrophy  Skin: warm and dry, no rash Neuro:  Alert and Oriented x 3, Strength and sensation are intact Psych: euthymic mood, full affect  Wt Readings from Last 3 Encounters:  08/13/16 256 lb (116.1 kg)  07/29/16 254 lb (115.2 kg)  07/15/16 251 lb 3.2 oz (113.9 kg)      Studies/Labs Reviewed:   EKG:  EKG is not ordered today.    Recent Labs: 05/21/2016: ALT 28; Hemoglobin 13.8; Magnesium 1.8; TSH 8.79 06/01/2016: Platelets 240 06/11/2016: BUN 15; Creatinine, Ser 1.29; Potassium 3.4; Sodium 138   Lipid Panel    Component Value Date/Time   CHOL 113 05/21/2016 1555   TRIG 120 05/21/2016 1555   HDL 48 05/21/2016 1555   CHOLHDL 2.4 05/21/2016 1555   VLDL 24 05/21/2016 1555   LDLCALC 41 05/21/2016 1555    Additional studies/ records that were reviewed today include:  none    ASSESSMENT:    1. Coronary artery disease involving native coronary artery of native heart without angina pectoris   2. Essential hypertension   3. Persistent atrial fibrillation ([place])   4. OSA (obstructive sleep apnea)   5. Type 2 diabetes mellitus with stage 2 chronic kidney disease, without long-term current use of insulin (HCC)   6. Pure hypercholesterolemia   7. Morbid obesity ([place])      PLAN:  In order of problems listed above:  1.   ASCAD with cath showing 50% LM, 40% distal LAD, 70% ost to prox LCs, 40% mid LAD and 95% mid to dist RCA s/p  PCI with DES x 2 and stage PCI of the LCx/OM.  He has not had any anginal symptoms.  He will continue on Plavix, high dose statin and BB. He is not on ASA due to NOAC.  It was recommended by Dr. Irish Lack at the time of PCI 09/2015, that he remain on Plavix with his NOAC longterm if no bleeding complications. He has had some bloody mucous in his throat at night which he thinks is due to the plavix.  I have recommended that he use nasal saline spray twice daily. He will let me know if it does not resolve.  2.   HTN - BP controlled on current meds. He will continue on amlodipine/BB and ARB.  3.   Persistent atrial fibrillation - he has been maintaining NSR on BB.  He will continue on Xarelto. Last CBC and BMET a few months ago was stable.  4.   OSA - he is intolerant to CPAP.  He was considering an oral device but I have recommended that he see Dr. Redmond Baseman at [facility] to discuss a new procedure for OSA called INSPIRE that has had very good results.  I would consider the oral device to be the last option at this point.   5.   Type 2 DM - this is followed by his PCP.  I will get a copy of last HbA1C from PCP.  Continue metformin.  6.   Hyperlipidemia with LDL goal < 70.  He will continue on statin.  I will get copy of his  FLP and ALT from PCP.    7.   Morbid obesity  - I have encouraged him to get into a routine exercise program and cut back on carbs and portions.       Medication Adjustments/Labs and Tests Ordered: Current medicines are reviewed at length with the patient today.  Concerns regarding medicines are outlined above.  Medication changes, Labs and Tests ordered today are listed in the Patient Instructions below.  Patient Instructions  Medication Instructions:  Your physician recommends that you continue on your current medications as directed. Please refer to the Current Medication list given to you today.   Labwork: None  Testing/Procedures: None  Follow-Up: You have been referred  to Dr. Redmond Baseman (ENT) to evaluate for [facility] implant.  Your physician wants you to follow-up in: 6 months with [name]. You will receive a reminder letter in the mail two months in advance. If you don't receive a letter, please call our office to schedule the follow-up appointment.   Any Other Special Instructions Will Be Listed Below (If Applicable).     If you need a refill on your cardiac medications before your next appointment, please call your pharmacy.      Signed, Fransico Him, MD  08/16/2016 4:21 PM    [facility] [place], [address], [place]  [address] Phone: [phone]; Fax: [phone]

## 2016-08-13 NOTE — Patient Instructions (Addendum)
Medication Instructions:  Your physician recommends that you continue on your current medications as directed. Please refer to the Current Medication list given to you today.   Labwork: None  Testing/Procedures: None  Follow-Up: You have been referred to Dr. Redmond Baseman (ENT) to evaluate for [facility] implant.  Your physician wants you to follow-up in: 6 months with [name]. You will receive a reminder letter in the mail two months in advance. If you don't receive a letter, please call our office to schedule the follow-up appointment.   Any Other Special Instructions Will Be Listed Below (If Applicable).     If you need a refill on your cardiac medications before your next appointment, please call your pharmacy.

## 2016-08-19 ENCOUNTER — Ambulatory Visit: Payer: Self-pay | Admitting: Internal Medicine

## 2016-08-19 DIAGNOSIS — G4733 Obstructive sleep apnea (adult) (pediatric): Secondary | ICD-10-CM | POA: Insufficient documentation

## 2016-08-24 ENCOUNTER — Encounter: Payer: Self-pay | Admitting: Internal Medicine

## 2016-08-24 ENCOUNTER — Ambulatory Visit (INDEPENDENT_AMBULATORY_CARE_PROVIDER_SITE_OTHER): Payer: Medicare Other | Admitting: Internal Medicine

## 2016-08-24 VITALS — BP 136/78 | HR 72 | Temp 97.5°F | Resp 16 | Ht 71.0 in | Wt 262.2 lb

## 2016-08-24 DIAGNOSIS — E559 Vitamin D deficiency, unspecified: Secondary | ICD-10-CM

## 2016-08-24 DIAGNOSIS — E1122 Type 2 diabetes mellitus with diabetic chronic kidney disease: Secondary | ICD-10-CM | POA: Diagnosis not present

## 2016-08-24 DIAGNOSIS — I10 Essential (primary) hypertension: Secondary | ICD-10-CM

## 2016-08-24 DIAGNOSIS — M1A9XX0 Chronic gout, unspecified, without tophus (tophi): Secondary | ICD-10-CM | POA: Diagnosis not present

## 2016-08-24 DIAGNOSIS — Z79899 Other long term (current) drug therapy: Secondary | ICD-10-CM

## 2016-08-24 DIAGNOSIS — E782 Mixed hyperlipidemia: Secondary | ICD-10-CM | POA: Diagnosis not present

## 2016-08-24 DIAGNOSIS — N182 Chronic kidney disease, stage 2 (mild): Secondary | ICD-10-CM

## 2016-08-24 LAB — HEPATIC FUNCTION PANEL
ALBUMIN: 4.1 g/dL (ref 3.6–5.1)
ALK PHOS: 101 U/L (ref 40–115)
ALT: 19 U/L (ref 9–46)
AST: 20 U/L (ref 10–35)
BILIRUBIN TOTAL: 0.8 mg/dL (ref 0.2–1.2)
Bilirubin, Direct: 0.2 mg/dL (ref <=0.2)
Indirect Bilirubin: 0.6 mg/dL (ref 0.2–1.2)
Total Protein: 7.7 g/dL (ref 6.1–8.1)

## 2016-08-24 LAB — CBC WITH DIFFERENTIAL/PLATELET
Basophils Absolute: 0 cells/uL (ref 0–200)
Basophils Relative: 0 %
EOS PCT: 1 %
Eosinophils Absolute: 88 cells/uL (ref 15–500)
HCT: 39.8 % (ref 38.5–50.0)
HEMOGLOBIN: 13.2 g/dL (ref 13.2–17.1)
LYMPHS ABS: 1936 {cells}/uL (ref 850–3900)
Lymphocytes Relative: 22 %
MCH: 31.5 pg (ref 27.0–33.0)
MCHC: 33.2 g/dL (ref 32.0–36.0)
MCV: 95 fL (ref 80.0–100.0)
MPV: 9.4 fL (ref 7.5–12.5)
Monocytes Absolute: 616 cells/uL (ref 200–950)
Monocytes Relative: 7 %
NEUTROS ABS: 6160 {cells}/uL (ref 1500–7800)
Neutrophils Relative %: 70 %
PLATELETS: 300 10*3/uL (ref 140–400)
RBC: 4.19 MIL/uL — AB (ref 4.20–5.80)
RDW: 15.6 % — AB (ref 11.0–15.0)
WBC: 8.8 10*3/uL (ref 3.8–10.8)

## 2016-08-24 LAB — BASIC METABOLIC PANEL WITH GFR
BUN: 16 mg/dL (ref 7–25)
CO2: 30 mmol/L (ref 20–31)
Calcium: 9.5 mg/dL (ref 8.6–10.3)
Chloride: 96 mmol/L — ABNORMAL LOW (ref 98–110)
Creat: 1.26 mg/dL — ABNORMAL HIGH (ref 0.70–1.25)
GFR, EST AFRICAN AMERICAN: 68 mL/min (ref >=60)
GFR, Est Non African American: 59 mL/min — ABNORMAL LOW (ref >=60)
Glucose, Bld: 96 mg/dL (ref 65–99)
POTASSIUM: 3.5 mmol/L (ref 3.5–5.3)
Sodium: 141 mmol/L (ref 135–146)

## 2016-08-24 LAB — LIPID PANEL
Cholesterol: 124 mg/dL (ref <200)
HDL: 50 mg/dL (ref >40)
LDL CALC: 52 mg/dL (ref <100)
TRIGLYCERIDES: 109 mg/dL (ref <150)
Total CHOL/HDL Ratio: 2.5 Ratio (ref <5.0)
VLDL: 22 mg/dL (ref <30)

## 2016-08-24 NOTE — Patient Instructions (Signed)

## 2016-08-24 NOTE — Progress Notes (Signed)
This very nice 66 y.o. single WM presents for 3 month follow up with Hypertension, Hyperlipidemia, ASHD/CAD, T2_NIDDM, Hypothyroidism and Vitamin D Deficiency. Patient also has hx/o Gout apparently in remission on meds.  Patient has been on thyroid replacement since 2015. Patient also has OSA and has been intolerant to CPAP.      Patient is treated for HTN (2006) & BP has been controlled at home. Today's BP is sl elevated at 148/82 dropping to 136/78 on recheck.  Patient has ASHD followed by Dr Fransico Him  And has hx/o cAfib- s/p CV (July 2017) then RF ablation (Jan 2018) and on Plavix since. He also has ASCAD s/p PCA/ Stenting/DES in Sept 2017.  Hx/o Pulm Toxicity on Amiodarone.  Patient has had no complaints of any cardiac type chest pain, palpitations, dyspnea/orthopnea/PND, dizziness, claudication, or dependent edema.     Hyperlipidemia is controlled with diet & meds. Patient denies myalgias or other med SE's. Last Lipids were at goal: Lab Results  Component Value Date   CHOL 113 05/21/2016   HDL 48 05/21/2016   LDLCALC 41 05/21/2016   TRIG 120 05/21/2016   CHOLHDL 2.4 05/21/2016      Also, the patient has history of Morbid Obesity (BMI 36+) and consequent T2_NIDDM and has had no symptoms of reactive hypoglycemia, diabetic polys, paresthesias or visual blurring.   Patient is not dietary compliant and last A1c was not at goal: Lab Results  Component Value Date   HGBA1C 6.6 (H) 05/21/2016      Further, the patient also has history of Vitamin D Deficiency ("36" in Dec 2015)  and supplements vitamin D without any suspected side-effects. Last vitamin D was still sl low (goal is 70-100):  Lab Results  Component Value Date   VD25OH 54 11/12/2014   Current Outpatient Prescriptions on File Prior to Visit  Medication Sig  . allopurinol  300 MG tablet Take 150 mg by mouth daily.  [name] amLODipine  5 MG tablet Take 1 tablet (5 mg total) by mouth daily.  [name] atenolol  25 MG tablet Take 1 tablet  (25 mg total) by mouth daily.  [name] atorvastatin  80 MG tablet take 1 tablet by mouth once daily AT 6PM  . B COMPLEX  Take 1 tablet by mouth daily.  [name] VITAMIN D 1000 UNITS tablet Take 1,000 Units by mouth daily.  . clopidogrel  75 MG tablet Take 1 tablet (75 mg total) by mouth daily with breakfast.  . colchicine 0.6 MG tablet Take 0.6 mg by mouth as needed.  . NORCO 5-325 MG tablet Take 1 tablet by mouth every 6 (six) hours as needed for moderate pain.  [name] levothyroxine 75 MCG  take 1 tablet by mouth once daily before BREAKFAST  . loratadine) 10 MG tablet Take 10 mg by mouth daily.  . magnesium  500 MG tablet Take 1,000 mg by mouth 2 (two) times daily.   . metFORMIN 500 MG tablet Take 1 to 2 tablets 2 x / day with food as directed for Diabetes  . nystatin cream  Apply 1 application topically 2 (two) times daily as needed (wound care).  . valsartan320 MG tablet take 1 tablet by mouth once daily  . XARELTO 20 MG TABS tablet take 1 tablet by mouth once daily WITH SUPPER  . KLOR-CON20 MEQ Take 1 tablet (20 mEq total) by mouth daily. For 7 days   Allergies  Allergen Reactions  . Amiodarone Other (See Comments)  Lung toxicity  . Bee Venom Other (See Comments)    Leg went numb   PMHx:   Past Medical History:  Diagnosis Date  . Arthritis    "joints get stiff" (06/11/2016)  . Bee sting allergy   . CAD (coronary artery disease), native coronary artery 02/12/2016   cath showing 50% LM, 40% distal LAD, 70% ost to prox LCs, 40% mid LAD and 95% mid to dist RCA s/p PCI with DES x 2 and stage PCI of the LCx/OM.  [name] History of gout   . Hyperlipidemia   . Hypertension   . Hypothyroidism   . OSA (obstructive sleep apnea)    "tried CPAP; couldn't do it" (06/11/2016)  . Persistent atrial fibrillation ([place])    s/p DCCV 03/2015 with reversion back to atrial fibrillation, loaded with Amio and s/p DCCV to NSR 11/2015.  He is now s/p afib albation.  He had amio lung toxicity and amio was stopped.  . Thyroid  disease   . Type II diabetes mellitus ([place])   . Vitamin D deficiency    Immunization History  Administered Date(s) Administered  . Influenza Split 04/05/2014  . Influenza-Unspecified 02/02/2013, 05/21/2016  . Pneumococcal Conjugate-13 05/01/2014  . Pneumococcal Polysaccharide-23 05/16/2015  . Td 04/25/2010  . Varicella Zoster Immune Globulin 02/22/2014   Past Surgical History:  Procedure Laterality Date  . CARDIAC CATHETERIZATION N/A 10/11/2015   Procedure: Left Heart Cath and Coronary Angiography;  Surgeon: [name], MD;  Location: [place] [facility];  Service: Cardiovascular;  Laterality: N/A;  . CARDIAC CATHETERIZATION N/A 10/11/2015   Procedure: Coronary Stent Intervention;  Surgeon: [name], MD;  Location: [place] [facility];  Service: Cardiovascular;  Laterality: N/A;  . CARDIAC CATHETERIZATION N/A 10/11/2015   Procedure: Intravascular Pressure Wire/FFR Study;  Surgeon: [name], MD;  Location: [place] [facility];  Service: Cardiovascular;  Laterality: N/A;  . CARDIAC CATHETERIZATION N/A 10/14/2015   Procedure: Coronary Stent Intervention;  Surgeon: [name], MD;  Location: [place] [facility];  Service: Cardiovascular;  Laterality: N/A;  . CARDIOVERSION N/A 04/05/2015   Procedure: CARDIOVERSION;  Surgeon: [name], MD;  Location: [place] [facility];  Service: Cardiovascular;  Laterality: N/A;  . CARDIOVERSION N/A 12/09/2015   Procedure: CARDIOVERSION;  Surgeon: [name], MD;  Location: [facility];  Service: Cardiovascular;  Laterality: N/A;  . CARDIOVERSION N/A 03/16/2016   Procedure: CARDIOVERSION;  Surgeon: [name], MD;  Location: [place];  Service: Cardiovascular;  Laterality: N/A;  . CORONARY ANGIOPLASTY    . ELECTROPHYSIOLOGIC STUDY N/A 06/11/2016   Procedure: Atrial Fibrillation Ablation;  Surgeon: Will [name], MD;  Location: [place] [facility];  Service: Cardiovascular;  Laterality: N/A;  . JOINT REPLACEMENT    .  TOTAL HIP ARTHROPLASTY Bilateral 2001  . UVULECTOMY     FHx:    Reviewed / unchanged  SHx:    Reviewed / unchanged  Systems Review:  Constitutional: Denies fever, chills, wt changes, headaches, insomnia, fatigue, night sweats, change in appetite. Eyes: Denies redness, blurred vision, diplopia, discharge, itchy, watery eyes.  ENT: Denies discharge, congestion, post nasal drip, epistaxis, sore throat, earache, hearing loss, dental pain, tinnitus, vertigo, sinus pain, snoring.  CV: Denies chest pain, palpitations, irregular heartbeat, syncope, dyspnea, diaphoresis, orthopnea, PND, claudication or edema. Respiratory: denies cough, dyspnea, DOE, pleurisy, hoarseness, laryngitis, wheezing.  Gastrointestinal: Denies dysphagia, odynophagia, heartburn, reflux, water brash, abdominal pain or cramps, nausea, vomiting, bloating, diarrhea, constipation, hematemesis, melena, hematochezia  or hemorrhoids.  Genitourinary: Denies dysuria, frequency, urgency, nocturia, hesitancy, discharge, hematuria or flank pain. Musculoskeletal: Denies arthralgias, myalgias, stiffness, jt. swelling, pain, limping or strain/sprain.  Skin: Denies pruritus, rash, hives, warts, acne, eczema or change in skin lesion(s). Neuro: No weakness, tremor, incoordination, spasms, paresthesia or pain. Psychiatric: Denies confusion, memory loss or sensory loss. Endo: Denies change in weight, skin or hair change.  Heme/Lymph: No excessive bleeding, bruising or enlarged lymph nodes.  Physical Exam  BP 136/78   P 72   Temp 97.5 F    R 16   Ht 5\' 11"     Wt 262 lb    BMI 36.57  Appears over nourished, well groomed  and in no distress.  Eyes: PERRLA, EOMs, conjunctiva no swelling or erythema. Sinuses: No frontal/maxillary tenderness ENT/Mouth: EAC's clear, TM's nl w/o erythema, bulging. Nares clear w/o erythema, swelling, exudates. Oropharynx clear without erythema or exudates. Mallapati 2/3.Oral hygiene is good. Tongue normal, non  obstructing. Hearing intact.  Neck: Supple. Thyroid nl. Car 2+/2+ without bruits, nodes or JVD. Chest: Respirations nl with BS clear & equal w/o rales, rhonchi, wheezing or stridor.  Cor: Heart sounds normal w/ regular rate and rhythm without sig. murmurs, gallops, clicks or rubs. Peripheral pulses normal and equal  without edema.  Abdomen: Soft & bowel sounds normal. Non-tender w/o guarding, rebound, hernias, masses or organomegaly.  Lymphatics: Unremarkable.  Musculoskeletal: Full ROM all peripheral extremities, joint stability, 5/5 strength and normal gait.  Skin: Warm, dry without exposed rashes, lesions or ecchymosis apparent.  Neuro: Cranial nerves intact, reflexes equal bilaterally. Sensory-motor testing grossly intact. Tendon reflexes grossly intact.  Pysch: Alert & oriented x 3.  Insight and judgement nl & appropriate. No ideations.  Assessment and Plan:  1. Essential hypertension  - Continue medication, monitor blood pressure at home.  - Continue DASH diet. Reminder to go to the ER if any CP,  SOB, nausea, dizziness, severe HA, changes vision/speech,  left arm numbness and tingling and jaw pain.  - CBC with Differential/Platelet - BASIC METABOLIC PANEL WITH GFR - Magnesium - TSH  2. Mixed hyperlipidemia  - Continue diet/meds, exercise,& lifestyle modifications.  - Continue monitor periodic cholesterol/liver & renal functions  - Hepatic function panel - Lipid panel - TSH  3. T2_NIDDM w/ CKD2  (HCC)  - Continue diet, exercise, lifestyle modifications.  - Monitor appropriate labs.  - Hemoglobin A1c - Insulin, random  4. Vitamin D deficiency  - Continue supplementation.  - VITAMIN D 25 Hydroxy  5. Chronic gout   - Uric acid  6. Medication management  - CBC with Differential/Platelet - BASIC METABOLIC PANEL WITH GFR - Hepatic function panel - Magnesium - Lipid panel - TSH - Hemoglobin A1c - Insulin, random - VITAMIN D 25 Hydroxy  - Uric  acid       Discussed  regular exercise, BP monitoring, weight control to achieve/maintain BMI less than 25 and discussed med and SE's. Recommended labs to assess and monitor clinical status with further disposition pending results of labs. Over 30 minutes of exam, counseling, chart review was performed.

## 2016-08-25 ENCOUNTER — Other Ambulatory Visit: Payer: Self-pay | Admitting: Internal Medicine

## 2016-08-25 DIAGNOSIS — E039 Hypothyroidism, unspecified: Secondary | ICD-10-CM

## 2016-08-25 LAB — TSH: TSH: 10.41 m[IU]/L — AB (ref 0.40–4.50)

## 2016-08-25 LAB — INSULIN, RANDOM: Insulin: 12.5 u[IU]/mL (ref 2.0–19.6)

## 2016-08-25 LAB — VITAMIN D 25 HYDROXY (VIT D DEFICIENCY, FRACTURES): Vit D, 25-Hydroxy: 53 ng/mL (ref 30–100)

## 2016-08-25 LAB — HEMOGLOBIN A1C
Hgb A1c MFr Bld: 6.4 % — ABNORMAL HIGH (ref <5.7)
MEAN PLASMA GLUCOSE: 137 mg/dL

## 2016-08-25 LAB — MAGNESIUM: MAGNESIUM: 1.6 mg/dL (ref 1.5–2.5)

## 2016-08-25 LAB — URIC ACID: Uric Acid, Serum: 5.2 mg/dL (ref 4.0–8.0)

## 2016-09-08 NOTE — Progress Notes (Signed)
Electrophysiology Office Note   Date:  09/10/2016   ID:  Christopher Wade, DOB 1950/09/30, MRN [id number]  PCP:  Alesia Richards, MD  Cardiologist:  Radford Pax Primary Electrophysiologist:  Christopher Matsuura [name], MD    Chief Complaint  Patient presents with  . Follow-up    Persistent Afib     History of Present Illness: Christopher Wade is a 66 y.o. male who presents today for electrophysiology evaluation.   He has a history of atrial fibrillation s/p DCCV 03/2015 to NSR but before discharge he went back into afib. He also has a history of HTN, dyslipidemia, DM, ASCAD cath showing 50% LM, 40% distal LAD, 70% ost to prox LCs, 40% mid LAD and 95% mid to dist RCA s/p PCI with DES x 2 and stage PCI of the LCx/OM. At last OV he was loaded with Amio and then he was set up to have DCCV which he successfully had done in July 2017. Repeat cardioversion 03/16/16. AF ablation 06/11/16.  He feels well today. He has no complaints of palpitations, shortness of breath, or chest pain. He has not had any episodes of atrial fibrillation since his ablation. He was taken off of his amiodarone, and his shortness of breath improved significantly. He was having some bloody sputum at night, and stopped his Plavix for 4 days. The bloody sputum resolved. He otherwise has no major complaints.   Past Medical History:  Diagnosis Date  . Arthritis    "joints get stiff" (06/11/2016)  . Bee sting allergy   . CAD (coronary artery disease), native coronary artery 02/12/2016   cath showing 50% LM, 40% distal LAD, 70% ost to prox LCs, 40% mid LAD and 95% mid to dist RCA s/p PCI with DES x 2 and stage PCI of the LCx/OM.  [name] History of gout   . Hyperlipidemia   . Hypertension   . Hypothyroidism   . OSA (obstructive sleep apnea)    "tried CPAP; couldn't do it" (06/11/2016)  . Persistent atrial fibrillation ([place])    s/p DCCV 03/2015 with reversion back to atrial fibrillation, loaded with Amio and s/p DCCV to NSR 11/2015.  He  is now s/p afib albation.  He had amio lung toxicity and amio was stopped.  . Thyroid disease   . Type II diabetes mellitus ([place])   . Vitamin D deficiency    Past Surgical History:  Procedure Laterality Date  . CARDIAC CATHETERIZATION N/A 10/11/2015   Procedure: Left Heart Cath and Coronary Angiography;  Surgeon: [name], MD;  Location: [place] [facility] [facility];  Service: Cardiovascular;  Laterality: N/A;  . CARDIAC CATHETERIZATION N/A 10/11/2015   Procedure: Coronary Stent Intervention;  Surgeon: [name], MD;  Location: [facility];  Service: Cardiovascular;  Laterality: N/A;  . CARDIAC CATHETERIZATION N/A 10/11/2015   Procedure: Intravascular Pressure Wire/FFR Study;  Surgeon: [name], MD;  Location: [facility] [facility];  Service: Cardiovascular;  Laterality: N/A;  . CARDIAC CATHETERIZATION N/A 10/14/2015   Procedure: Coronary Stent Intervention;  Surgeon: [name], MD;  Location: [facility];  Service: Cardiovascular;  Laterality: N/A;  . CARDIOVERSION N/A 04/05/2015   Procedure: CARDIOVERSION;  Surgeon: [name], MD;  Location: [facility];  Service: Cardiovascular;  Laterality: N/A;  . CARDIOVERSION N/A 12/09/2015   Procedure: CARDIOVERSION;  Surgeon: [name], MD;  Location: [facility];  Service: Cardiovascular;  Laterality: N/A;  . CARDIOVERSION N/A 03/16/2016   Procedure: CARDIOVERSION;  Surgeon:  [name], MD;  Location: [facility];  Service: Cardiovascular;  Laterality: N/A;  . CORONARY ANGIOPLASTY    . ELECTROPHYSIOLOGIC STUDY N/A 06/11/2016   Procedure: Atrial Fibrillation Ablation;  Surgeon: Guillermo Difrancesco [name], MD;  Location: [facility];  Service: Cardiovascular;  Laterality: N/A;  . JOINT REPLACEMENT    . TOTAL HIP ARTHROPLASTY Bilateral 2001  . UVULECTOMY       Current Outpatient Prescriptions  Medication Sig Dispense Refill  . allopurinol (ZYLOPRIM) 300 MG tablet Take 150 mg by mouth daily.    [name]  amLODipine (NORVASC) 5 MG tablet Take 1 tablet (5 mg total) by mouth daily. 30 tablet 11  . atenolol (TENORMIN) 25 MG tablet Take 1 tablet (25 mg total) by mouth daily. 90 tablet 3  . atorvastatin (LIPITOR) 80 MG tablet take 1 tablet by mouth once daily AT 6PM 90 tablet 0  . B Complex Vitamins (B COMPLEX PO) Take 1 tablet by mouth daily.    . cholecalciferol (VITAMIN D) 1000 UNITS tablet Take 1,000 Units by mouth daily.    . clopidogrel (PLAVIX) 75 MG tablet Take 1 tablet (75 mg total) by mouth daily with breakfast. 30 tablet 11  . colchicine 0.6 MG tablet Take 0.6 mg by mouth as needed.    [name] HYDROcodone-acetaminophen (NORCO/VICODIN) 5-325 MG tablet Take 1 tablet by mouth every 6 (six) hours as needed for moderate pain. 60 tablet 0  . levothyroxine (SYNTHROID, LEVOTHROID) 75 MCG tablet take 1 tablet by mouth once daily before BREAKFAST 90 tablet 1  . loratadine (CLARITIN) 10 MG tablet Take 10 mg by mouth daily.    . magnesium gluconate (MAGONATE) 500 MG tablet Take 1,000 mg by mouth 2 (two) times daily.     . metFORMIN (GLUCOPHAGE) 500 MG tablet Take 1 to 2 tablets 2 x / day with food as directed for Diabetes 360 tablet 1  . nystatin cream (MYCOSTATIN) Apply 1 application topically 2 (two) times daily as needed (wound care). 30 g 1  . valsartan (DIOVAN) 320 MG tablet take 1 tablet by mouth once daily 90 tablet 0  . XARELTO 20 MG TABS tablet take 1 tablet by mouth once daily WITH SUPPER 30 tablet 4  . potassium chloride SA (KLOR-CON M20) 20 MEQ tablet Take 1 tablet (20 mEq total) by mouth daily. For 7 days 30 tablet 0   No current facility-administered medications for this visit.     Allergies:   Amiodarone and Bee venom   Social History:  The patient  reports that he has never smoked. He has never used smokeless tobacco. He reports that he does not drink alcohol or use drugs.   Family History:  The patient's family history includes Colon cancer in his sister; Lung cancer in his father and  mother.    ROS:  Please see the history of present illness.   Otherwise, review of systems is positive for none.   All other systems are reviewed and negative.    PHYSICAL EXAM: VS:  BP 140/76   Pulse 61   Ht 6' (1.829 m)   Wt 261 lb 12.8 oz (118.8 kg)   BMI 35.51 kg/m  , BMI Body mass index is 35.51 kg/m. GEN: Well nourished, well developed, in no acute distress  HEENT: normal  Neck: no JVD, carotid bruits, or masses Cardiac: RRR; no murmurs, rubs, or gallops,no edema  Respiratory:  clear to auscultation bilaterally, normal work of breathing GI: soft, nontender, nondistended, + BS MS: no deformity  or atrophy  Skin: warm and dry Neuro:  Strength and sensation are intact Psych: euthymic mood, full affect   EKG:  EKG is ordered today. Personal review of the ekg ordered shows sinus rhythm, rate 61  Recent Labs: 08/24/2016: ALT 19; BUN 16; Creat 1.26; Hemoglobin 13.2; Magnesium 1.6; Platelets 300; Potassium 3.5; Sodium 141; TSH 10.41    Lipid Panel     Component Value Date/Time   CHOL 124 08/24/2016 1656   TRIG 109 08/24/2016 1656   HDL 50 08/24/2016 1656   CHOLHDL 2.5 08/24/2016 1656   VLDL 22 08/24/2016 1656   LDLCALC 52 08/24/2016 1656     Wt Readings from Last 3 Encounters:  09/10/16 261 lb 12.8 oz (118.8 kg)  08/24/16 262 lb 3.2 oz (118.9 kg)  08/13/16 256 lb (116.1 kg)      Other studies Reviewed: Additional studies/ records that were reviewed today include: TTE 03/2015, Cath 10/14/15  Review of the above records today demonstrates:  - Left ventricle: The cavity size was normal. Wall thickness was   increased in a pattern of mild LVH. Systolic function was normal.   The estimated ejection fraction was in the range of 60% to 65%.   Indeterminant diastolic function (atrial fibrillation). Wall   motion was normal; there were no regional wall motion   abnormalities. - Aortic valve: There was no stenosis. - Aorta: Borderline dilated aortic root. - Mitral  valve: There was trivial regurgitation. - Left atrium: The atrium was mildly dilated. - Right ventricle: The cavity size was moderately dilated. Systolic   function was normal. - Right atrium: The atrium was mildly dilated. - Tricuspid valve: Peak RV-RA gradient (S): 32 mm Hg. - Pulmonary arteries: PA peak pressure: 35 mm Hg (S). - Inferior vena cava: The vessel was normal in size. The   respirophasic diameter changes were in the normal range (>= 50%),   consistent with normal central venous pressure.   Ost 1st Mrg to 1st Mrg lesion to mid circumflex, 95% stenosed. Post intervention with a 2.75 x 32 Synergy, postdilated to 3.5 mm, there is a 0% residual stenosis.  The Synergy stent extends from the OM back into the native circumflex, jailing the mid circumflex. Postprocedure, there is TIMI-3 flow in the circumflex with only a moderate stenosis at the jailed portion.   Cath 10/11/15  LM lesion, 50% stenosed.  Dist LAD lesion, 40% stenosed.  Ost Cx to Prox Cx lesion, 70% stenosed.  1st Mrg lesion, 70% stenosed.  Mid LAD lesion, 40% stenosed.  Mid RCA to Dist RCA lesion, 95% stenosed. Post intervention, there is a 0% residual stenosis. The lesion was not previously treated.   ASSESSMENT AND PLAN:  1.  Persistent atrial fibrillation/flutter: on Xarelto. Ablation 06/11/16. Is feeling well since his atrial fibrillation ablation. Shortness of breath is improved since stopping his amiodarone. We'll make no further changes to his medications. CHADS2VASc 4.   2. Hypertension: Mildly elevated but has been within normal limits in the past. We'll continue to monitor.   3. Hyperlipidemia:Continue statin  4. CAD: s/p PCI to Cx and RCA. is currently on Plavix, and was told that he could stop this in February by [name]. We'll discuss with her for further decision-making on the necessity of Plavix.   5. OSA: continue to not use CPAP. Encourage compliance.      Current medicines are  reviewed at length with the patient today.   The patient does not have concerns regarding his medicines.  The following changes were made today:  none  Labs/ tests ordered today include:  Orders Placed This Encounter  Procedures  . EKG 12-Lead     Disposition:   FU with Lateka Rady 3 months  Signed, Kenwood Rosiak [name], MD  09/10/2016 10:52 AM     [facility] [address] [place]  [address] [phone] (office) [phone] (fax)

## 2016-09-10 ENCOUNTER — Ambulatory Visit (INDEPENDENT_AMBULATORY_CARE_PROVIDER_SITE_OTHER): Payer: Medicare Other | Admitting: Cardiology

## 2016-09-10 ENCOUNTER — Encounter: Payer: Self-pay | Admitting: Cardiology

## 2016-09-10 VITALS — BP 140/76 | HR 61 | Ht 72.0 in | Wt 261.8 lb

## 2016-09-10 DIAGNOSIS — I2510 Atherosclerotic heart disease of native coronary artery without angina pectoris: Secondary | ICD-10-CM

## 2016-09-10 DIAGNOSIS — I480 Paroxysmal atrial fibrillation: Secondary | ICD-10-CM

## 2016-09-10 NOTE — Patient Instructions (Signed)
Medication Instructions:  Your physician recommends that you continue on your current medications as directed. Please refer to the Current Medication list given to you today.  Labwork: None ordered  Testing/Procedures: None ordered  Follow-Up: Your physician recommends that you schedule a follow-up appointment in: 3 months with Dr. Camnitz.   -- If you need a refill on your cardiac medications before your next appointment, please call your pharmacy. --  Thank you for choosing [facility]!!   Trameka Dorough, RN [phone]       

## 2016-09-11 ENCOUNTER — Other Ambulatory Visit: Payer: Self-pay | Admitting: Internal Medicine

## 2016-09-14 ENCOUNTER — Telehealth: Payer: Self-pay | Admitting: *Deleted

## 2016-09-14 NOTE — Telephone Encounter (Signed)
Advised pt to remain on Plavix, per [name].  Pt tells me that he has been experiencing bloody sputum, at night, for 5/6 months now.  Pt tells me that his pulmonologist is aware (seen last month).  Informed patient that I would forward this message as FYI to pulmonology.  Patient verbalized understanding and agreeable to plan.

## 2016-09-14 NOTE — Telephone Encounter (Signed)
Patient returning your call, thanks. °

## 2016-09-14 NOTE — Telephone Encounter (Signed)
lmtcb

## 2016-09-14 NOTE — Telephone Encounter (Signed)
-----   Message from Will [name], MD sent at 09/10/2016  2:48 PM EDT ----- Can we get this arranged. Needs to continue plavix per [place]. ----- Message ----- From: [name], MD Sent: 09/10/2016  11:19 AM To: Constance Haw, MD  I would prefer for him to stay on the Plavix.  Could you get your nurse to set him up an appt with his pulmonary MD and if he does not have one then refer him?  Thanks Traci ----- Message ----- From: Constance Haw, MD Sent: 09/10/2016  10:54 AM To: [name], MD  Traci,  Mr. Calabretta says that he would like to stop his plavix due to bloody sputum at night. I told him that I would run this by you.  I agree that it would be best for him to continue. What are your thoughts? He seems fairly insistent. He seems to think that you said he could stop in in May.  Thanks  Will

## 2016-09-15 NOTE — Telephone Encounter (Signed)
Christopher Wade   He is havin hemoptysis. CT 07/07/16 did not reveal cuase for this. He is on plavix which card would like to continue. Could you please give him fu appt to see next few weeks  Thanks   Dr. Brand Males, M.D., [facility].C.P Pulmonary and Critical Care Medicine Staff Physician [facility] Pager: [phone], If no answer or between  15:00h - 7:00h: call [phone]  09/15/2016 4:05 AM

## 2016-09-15 NOTE — Telephone Encounter (Signed)
Called and spoke to pt. Appt made with MR on 10/08/16. Pt verbalized understanding and denied any further questions or concerns at this time.

## 2016-09-28 ENCOUNTER — Ambulatory Visit (INDEPENDENT_AMBULATORY_CARE_PROVIDER_SITE_OTHER): Payer: Medicare Other

## 2016-09-28 ENCOUNTER — Other Ambulatory Visit: Payer: Self-pay

## 2016-09-28 DIAGNOSIS — E039 Hypothyroidism, unspecified: Secondary | ICD-10-CM

## 2016-09-28 DIAGNOSIS — E079 Disorder of thyroid, unspecified: Secondary | ICD-10-CM

## 2016-09-28 LAB — TSH: TSH: 9.37 mIU/L — ABNORMAL HIGH (ref 0.40–4.50)

## 2016-09-28 NOTE — Progress Notes (Signed)
Pt presents for TSH lab, pt states he is taking his thyroid med as directed by provider & had no questions or concerns.

## 2016-09-28 NOTE — Addendum Note (Signed)
Addended by: Elsie Amis D on: 09/28/2016 04:01 PM   Modules accepted: Orders

## 2016-09-29 ENCOUNTER — Other Ambulatory Visit: Payer: Self-pay | Admitting: Internal Medicine

## 2016-09-29 DIAGNOSIS — E039 Hypothyroidism, unspecified: Secondary | ICD-10-CM

## 2016-09-29 MED ORDER — LEVOTHYROXINE SODIUM 125 MCG PO TABS: ORAL_TABLET | ORAL | 1 refills | Status: DC

## 2016-10-06 ENCOUNTER — Other Ambulatory Visit: Payer: Self-pay | Admitting: Internal Medicine

## 2016-10-07 ENCOUNTER — Other Ambulatory Visit: Payer: Self-pay | Admitting: Physician Assistant

## 2016-10-08 ENCOUNTER — Ambulatory Visit: Payer: Medicare Other | Admitting: Internal Medicine

## 2016-10-23 DIAGNOSIS — H2513 Age-related nuclear cataract, bilateral: Secondary | ICD-10-CM | POA: Diagnosis not present

## 2016-10-23 LAB — HM DIABETES EYE EXAM

## 2016-11-02 ENCOUNTER — Other Ambulatory Visit: Payer: Medicare Other

## 2016-11-02 DIAGNOSIS — E039 Hypothyroidism, unspecified: Secondary | ICD-10-CM

## 2016-11-03 ENCOUNTER — Other Ambulatory Visit: Payer: Self-pay | Admitting: Internal Medicine

## 2016-11-03 LAB — TSH: TSH: 3.35 m[IU]/L (ref 0.40–4.50)

## 2016-11-08 ENCOUNTER — Other Ambulatory Visit: Payer: Self-pay | Admitting: Cardiology

## 2016-11-27 ENCOUNTER — Other Ambulatory Visit: Payer: Self-pay | Admitting: Internal Medicine

## 2016-11-27 NOTE — Progress Notes (Signed)
Assessment and Plan:  Hypertension -Continue medication, monitor blood pressure at home. Continue DASH diet.  Reminder to go to the ER if any CP, SOB, nausea, dizziness, severe HA, changes vision/speech, left arm numbness and tingling and jaw pain.   Cholesterol -Continue diet and exercise. Check cholesterol.    Diabetes with diabetic chronic kidney disease -Continue diet and exercise. Check A1C Recheck BMP/GFR   Vitamin D Def - check level and continue medications.    Hypothyroidism -check TSH level, continue medications the same, reminded to take on an empty stomach 30-53mins before food.    Obesity with co morbidities - long discussion about weight loss, diet, and exercise   Gout - recheck Uric acid as needed, Diet discussed, continue medications.   Afib rate controlled Continue following up with cardio, still unable to tolerate CPAP due to dryness.   OA Will send to ortho if continues to have pain Explained can not give chronic pain medication  Continue diet and meds as discussed. Further disposition pending results of labs. Discussed med's effects and SE's.    HPI 66 y.o. male  presents for 3 month follow up  has a past medical history of Arthritis; Bee sting allergy; CAD (coronary artery disease), native coronary artery (02/12/2016); History of gout; Hyperlipidemia; Hypertension; Hypothyroidism; OSA (obstructive sleep apnea); Persistent atrial fibrillation ([place]); Thyroid disease; Type II diabetes mellitus ([place]); and Vitamin D deficiency.  His blood pressure has been controlled at home, better at home, today his BP is BP: 140/80.  He does workout, he is very active at work but with Afib, has had days with some dyspnea/fatigue with exertion especially with the heat, follows with cardio and on xarelto.   He is on cholesterol medication, fenofibrate stopped due to myalgias and elevated CPK. His cholesterol is not at goal. The cholesterol was:   Lab Results  Component Value Date    CHOL 124 08/24/2016   HDL 50 08/24/2016   LDLCALC 52 08/24/2016   TRIG 109 08/24/2016   CHOLHDL 2.5 08/24/2016   Lab Results  Component Value Date   CKTOTAL 73 08/20/2014   His last kidney function was worse, will recheck  Lab Results  Component Value Date   GFRNONAA 59 (L) 08/24/2016   He was started on allopurinol for elevated uric acid.   He has been working on diet and exercise for diabetes with diabetic chronic kidney disease, he is on bASA, he is on ACE/ARB, and denies  paresthesia of the feet and polydipsia. Last A1C was:  Lab Results  Component Value Date   HGBA1C 6.4 (H) 08/24/2016  Patient is on Vitamin D supplement. Lab Results  Component Value Date   VD25OH 32 08/24/2016  He is on thyroid medication. His medication was not changed last visit.   Lab Results  Component Value Date   TSH 3.35 11/02/2016  BMI is Body mass index is 36.13 kg/m., he is working on diet and exercise. Wt Readings from Last 3 Encounters:  11/30/16 266 lb 6.4 oz (120.8 kg)  09/10/16 261 lb 12.8 oz (118.8 kg)  08/24/16 262 lb 3.2 oz (118.9 kg)    Current Medications:  Current Outpatient Prescriptions on File Prior to Visit  Medication Sig Dispense Refill  . allopurinol (ZYLOPRIM) 300 MG tablet take 1 tablet by mouth once daily 90 tablet 0  . amLODipine (NORVASC) 5 MG tablet take 1 tablet by mouth once daily 30 tablet 11  . atenolol (TENORMIN) 25 MG tablet Take 1 tablet (25 mg total)  by mouth daily. 90 tablet 3  . atorvastatin (LIPITOR) 80 MG tablet take 1 tablet by mouth once daily AT 6PM 90 tablet 0  . B Complex Vitamins (B COMPLEX PO) Take 1 tablet by mouth daily.    . cholecalciferol (VITAMIN D) 1000 UNITS tablet Take 1,000 Units by mouth daily.    . clopidogrel (PLAVIX) 75 MG tablet Take 1 tablet (75 mg total) by mouth daily with breakfast. 30 tablet 11  . HYDROcodone-acetaminophen (NORCO/VICODIN) 5-325 MG tablet Take 1 tablet by mouth every 6 (six) hours as needed for moderate  pain. 60 tablet 0  . levothyroxine (SYNTHROID) 125 MCG tablet Take 1 tablet every morning on an empty stomach for 30 minutes and no antacids of 4 hours. 90 tablet 1  . loratadine (CLARITIN) 10 MG tablet Take 10 mg by mouth daily.    . magnesium gluconate (MAGONATE) 500 MG tablet Take 1,000 mg by mouth 2 (two) times daily.     . metFORMIN (GLUCOPHAGE) 500 MG tablet Take 1 to 2 tablets 2 x / day with food as directed for Diabetes 360 tablet 1  . nystatin cream (MYCOSTATIN) Apply 1 application topically 2 (two) times daily as needed (wound care). 30 g 1  . valsartan (DIOVAN) 320 MG tablet take 1 tablet by mouth once daily 90 tablet 0  . XARELTO 20 MG TABS tablet take 1 tablet by mouth once daily WITH SUPPER 90 tablet 1  . potassium chloride SA (KLOR-CON M20) 20 MEQ tablet Take 1 tablet (20 mEq total) by mouth daily. For 7 days 30 tablet 0   No current facility-administered medications on file prior to visit.    Medical History:  Past Medical History:  Diagnosis Date  . Arthritis    "joints get stiff" (06/11/2016)  . Bee sting allergy   . CAD (coronary artery disease), native coronary artery 02/12/2016   cath showing 50% LM, 40% distal LAD, 70% ost to prox LCs, 40% mid LAD and 95% mid to dist RCA s/p PCI with DES x 2 and stage PCI of the LCx/OM.  [name] History of gout   . Hyperlipidemia   . Hypertension   . Hypothyroidism   . OSA (obstructive sleep apnea)    "tried CPAP; couldn't do it" (06/11/2016)  . Persistent atrial fibrillation ([place])    s/p DCCV 03/2015 with reversion back to atrial fibrillation, loaded with Amio and s/p DCCV to NSR 11/2015.  He is now s/p afib albation.  He had amio lung toxicity and amio was stopped.  . Thyroid disease   . Type II diabetes mellitus ([place])   . Vitamin D deficiency    Allergies:  Allergies  Allergen Reactions  . Amiodarone Other (See Comments)    Lung toxicity  . Bee Venom Other (See Comments)    Leg went numb     Review of Systems:  Review of  Systems  Constitutional: Negative.   HENT: Negative.   Eyes: Negative.   Respiratory: Negative for cough, hemoptysis, sputum production, shortness of breath and wheezing.   Cardiovascular: Positive for leg swelling. Negative for chest pain, palpitations, orthopnea, claudication and PND.  Gastrointestinal: Negative.   Genitourinary: Negative.   Musculoskeletal: Positive for back pain, joint pain and myalgias. Negative for falls and neck pain.  Skin: Negative.   Neurological: Negative.   Endo/Heme/Allergies: Negative.   Psychiatric/Behavioral: Negative.     Family history- Review and unchanged Social history- Review and unchanged Physical Exam: BP 140/80   Pulse 73  Temp 98.1 F (36.7 C)   Resp 14   Ht 6' (1.829 m)   Wt 266 lb 6.4 oz (120.8 kg)   SpO2 98%   BMI 36.13 kg/m  Wt Readings from Last 3 Encounters:  11/30/16 266 lb 6.4 oz (120.8 kg)  09/10/16 261 lb 12.8 oz (118.8 kg)  08/24/16 262 lb 3.2 oz (118.9 kg)   General Appearance: Well nourished, in no apparent distress. Eyes: PERRLA, EOMs, conjunctiva no swelling or erythema Sinuses: No Frontal/maxillary tenderness ENT/Mouth: Ext aud canals clear, TMs without erythema, bulging. No erythema, swelling, or exudate on post pharynx.  Tonsils not swollen or erythematous. Hearing normal.  Neck: Supple, thyroid normal.  Respiratory: Respiratory effort normal, BS equal bilaterally without rales, rhonchi, wheezing or stridor.  Cardio: Irreg irreg with no MRGs. Brisk peripheral pulses with 2+ edema.  Abdomen: Soft, + BS, obese, Non tender, no guarding, rebound, hernias, masses. Lymphatics: Non tender without lymphadenopathy.  Musculoskeletal: Full ROM, 5/5 strength, Normal gait Skin: Right ear with scaly area. Warm, dry without rashes, lesions, ecchymosis.  Neuro: Cranial nerves intact. No cerebellar symptoms.  Psych: Awake and oriented X 3, normal affect, Insight and Judgment appropriate.    Vicie Mutters, PA-C 3:59  PM [facility]

## 2016-11-30 ENCOUNTER — Ambulatory Visit (INDEPENDENT_AMBULATORY_CARE_PROVIDER_SITE_OTHER): Payer: Medicare Other | Admitting: Physician Assistant

## 2016-11-30 ENCOUNTER — Encounter: Payer: Self-pay | Admitting: Physician Assistant

## 2016-11-30 ENCOUNTER — Other Ambulatory Visit: Payer: Self-pay

## 2016-11-30 VITALS — BP 140/80 | HR 73 | Temp 98.1°F | Resp 14 | Ht 72.0 in | Wt 266.4 lb

## 2016-11-30 DIAGNOSIS — N182 Chronic kidney disease, stage 2 (mild): Secondary | ICD-10-CM

## 2016-11-30 DIAGNOSIS — Z79899 Other long term (current) drug therapy: Secondary | ICD-10-CM

## 2016-11-30 DIAGNOSIS — I481 Persistent atrial fibrillation: Secondary | ICD-10-CM

## 2016-11-30 DIAGNOSIS — E782 Mixed hyperlipidemia: Secondary | ICD-10-CM

## 2016-11-30 DIAGNOSIS — E079 Disorder of thyroid, unspecified: Secondary | ICD-10-CM | POA: Diagnosis not present

## 2016-11-30 DIAGNOSIS — E1122 Type 2 diabetes mellitus with diabetic chronic kidney disease: Secondary | ICD-10-CM | POA: Diagnosis not present

## 2016-11-30 DIAGNOSIS — I4819 Other persistent atrial fibrillation: Secondary | ICD-10-CM

## 2016-11-30 DIAGNOSIS — I10 Essential (primary) hypertension: Secondary | ICD-10-CM | POA: Diagnosis not present

## 2016-11-30 LAB — CBC WITH DIFFERENTIAL/PLATELET
BASOS PCT: 0 %
Basophils Absolute: 0 cells/uL (ref 0–200)
EOS ABS: 174 {cells}/uL (ref 15–500)
Eosinophils Relative: 2 %
HCT: 43.1 % (ref 38.5–50.0)
Hemoglobin: 14 g/dL (ref 13.2–17.1)
Lymphocytes Relative: 21 %
Lymphs Abs: 1827 cells/uL (ref 850–3900)
MCH: 30.4 pg (ref 27.0–33.0)
MCHC: 32.5 g/dL (ref 32.0–36.0)
MCV: 93.7 fL (ref 80.0–100.0)
MONO ABS: 696 {cells}/uL (ref 200–950)
MONOS PCT: 8 %
MPV: 9.5 fL (ref 7.5–12.5)
Neutro Abs: 6003 cells/uL (ref 1500–7800)
Neutrophils Relative %: 69 %
PLATELETS: 258 10*3/uL (ref 140–400)
RBC: 4.6 MIL/uL (ref 4.20–5.80)
RDW: 17.1 % — ABNORMAL HIGH (ref 11.0–15.0)
WBC: 8.7 10*3/uL (ref 3.8–10.8)

## 2016-11-30 LAB — HEPATIC FUNCTION PANEL
ALK PHOS: 105 U/L (ref 40–115)
ALT: 30 U/L (ref 9–46)
AST: 23 U/L (ref 10–35)
Albumin: 4.3 g/dL (ref 3.6–5.1)
BILIRUBIN DIRECT: 0.2 mg/dL (ref <=0.2)
BILIRUBIN TOTAL: 0.8 mg/dL (ref 0.2–1.2)
Indirect Bilirubin: 0.6 mg/dL (ref 0.2–1.2)
Total Protein: 7.7 g/dL (ref 6.1–8.1)

## 2016-11-30 LAB — BASIC METABOLIC PANEL WITH GFR
BUN: 13 mg/dL (ref 7–25)
CHLORIDE: 101 mmol/L (ref 98–110)
CO2: 27 mmol/L (ref 20–31)
Calcium: 9.2 mg/dL (ref 8.6–10.3)
Creat: 1 mg/dL (ref 0.70–1.25)
GFR, Est African American: >89 mL/min (ref >=60)
GFR, Est Non African American: 78 mL/min (ref >=60)
Glucose, Bld: 119 mg/dL — ABNORMAL HIGH (ref 65–99)
POTASSIUM: 4.3 mmol/L (ref 3.5–5.3)
Sodium: 140 mmol/L (ref 135–146)

## 2016-11-30 LAB — LIPID PANEL
CHOL/HDL RATIO: 2.5 ratio (ref <5.0)
Cholesterol: 103 mg/dL (ref <200)
HDL: 42 mg/dL (ref >40)
LDL CALC: 42 mg/dL (ref <100)
Triglycerides: 94 mg/dL (ref <150)
VLDL: 19 mg/dL (ref <30)

## 2016-11-30 MED ORDER — COLCHICINE 0.6 MG PO TABS
ORAL_TABLET | ORAL | 0 refills | Status: DC
Start: 2016-11-30 — End: 2017-07-27

## 2016-11-30 NOTE — Patient Instructions (Signed)
Simple math prevails.    1st - exercise does not produce significant weight loss - at best one converts fat into muscle , "bulks up", loses inches, but usually stays "weight neutral"     2nd - think of your body weightas a check book: If you eat more calories than you burn up - you save money or gain weight .... Or if you spend more money than you put in the check book, ie burn up more calories than you eat, then you lose weight     3rd - if you walk or run 1 mile, you burn up 100 calories - you have to burn up 3,500 calories to lose 1 pound, ie you have to walk/run 35 miles to lose 1 measly pound. So if you want to lose 10 #, then you have to walk/run 350 miles, so.... clearly exercise is not the solution.     4. So if you consume 1,500 calories, then you have to burn up the equivalent of 15 miles to stay weight neutral - It also stands to reason that if you consume 1,500 cal/day and don't lose weight, then you must be burning up about 1,500 cals/day to stay weight neutral.     5. If you really want to lose weight, you must cut your calorie intake 300 calories /day and at that rate you should lose about 1 # every 3 days.   6. Please purchase Dr Fara Olden Fuhrman's book(s) "The End of Dieting" & "Eat to Live" . It has some great concepts and recipes.      We want weight loss that will last so you should lose 1-2 pounds a week.  THAT IS IT! Please pick THREE things a month to change. Once it is a habit check off the item. Then pick another three items off the list to become habits.  If you are already doing a habit on the list GREAT!  Cross that item off! o Don't drink your calories. Ie, alcohol, soda, fruit juice, and sweet tea.  o Drink more water. Drink a glass when you feel hungry or before each meal.  o Eat breakfast - Complex carb and protein (likeDannon light and fit yogurt, oatmeal, fruit, eggs, [place] bacon). o Measure your cereal.  Eat no more than one cup a day. (ie [place]) o Eat an apple  a day. o Add a vegetable a day. o Try a new vegetable a month. o Use Pam! Stop using oil or butter to cook. o Don't finish your plate or use smaller plates. o Share your dessert. o Eat sugar free Jello for dessert or frozen grapes. o Don't eat 2-3 hours before bed. o Switch to whole wheat bread, pasta, and brown rice. o Make healthier choices when you eat out. No fries! o Pick baked chicken, NOT fried. o Don't forget to SLOW DOWN when you eat. It is not going anywhere.  o Take the stairs. o Park far away in the parking lot o [employer] (or weights) for 10 minutes while watching TV. o Walk at work for 10 minutes during break. o Walk outside 1 time a week with your friend, kids, dog, or significant other. o Start a walking group at [place] the mall as much as you can tolerate.  o Keep a food diary. o Weigh yourself daily. o Walk for 15 minutes 3 days per week. o Cook at home more often and eat out less.  If life happens and you  go back to old habits, it is okay.  Just start over. You can do it!   If you experience chest pain, get short of breath, or tired during the exercise, please stop immediately and inform your doctor.    Constipation, Adult Constipation is when a person has fewer bowel movements in a week than normal, has difficulty having a bowel movement, or has stools that are dry, hard, or larger than normal. Constipation may be caused by an underlying condition. It may become worse with age if a person takes certain medicines and does not take in enough fluids. Follow these instructions at home: Eating and drinking   Eat foods that have a lot of fiber, such as fresh fruits and vegetables, whole grains, and beans.  Limit foods that are high in fat, low in fiber, or overly processed, such as french fries, hamburgers, cookies, candies, and soda.  Drink enough fluid to keep your urine clear or pale yellow. General instructions  Exercise regularly or as told by  your health care provider.  Go to the restroom when you have the urge to go. Do not hold it in.  Take over-the-counter and prescription medicines only as told by your health care provider. These include any fiber supplements.  Practice pelvic floor retraining exercises, such as deep breathing while relaxing the lower abdomen and pelvic floor relaxation during bowel movements.  Watch your condition for any changes.  Keep all follow-up visits as told by your health care provider. This is important. Contact a health care provider if:  You have pain that gets worse.  You have a fever.  You do not have a bowel movement after 4 days.  You vomit.  You are not hungry.  You lose weight.  You are bleeding from the anus.  You have thin, pencil-like stools. Get help right away if:  You have a fever and your symptoms suddenly get worse.  You leak stool or have blood in your stool.  Your abdomen is bloated.  You have severe pain in your abdomen.  You feel dizzy or you faint. This information is not intended to replace advice given to you by your health care provider. Make sure you discuss any questions you have with your health care provider. Document Released: 02/07/2004 Document Revised: 11/29/2015 Document Reviewed: 10/30/2015 Elsevier Interactive Patient Education  2017 [employer].

## 2016-12-01 LAB — MAGNESIUM: Magnesium: 1.9 mg/dL (ref 1.5–2.5)

## 2016-12-01 LAB — HEMOGLOBIN A1C
HEMOGLOBIN A1C: 7.4 % — AB (ref <5.7)
Mean Plasma Glucose: 166 mg/dL

## 2016-12-01 LAB — TSH: TSH: 2.77 mIU/L (ref 0.40–4.50)

## 2016-12-01 NOTE — Progress Notes (Signed)
Pt aware of lab results & voiced understanding of those results.

## 2016-12-10 ENCOUNTER — Ambulatory Visit (INDEPENDENT_AMBULATORY_CARE_PROVIDER_SITE_OTHER): Payer: Medicare Other | Admitting: Cardiology

## 2016-12-10 ENCOUNTER — Encounter: Payer: Self-pay | Admitting: Cardiology

## 2016-12-10 VITALS — BP 156/90 | HR 78 | Ht 72.0 in | Wt 267.2 lb

## 2016-12-10 DIAGNOSIS — I481 Persistent atrial fibrillation: Secondary | ICD-10-CM

## 2016-12-10 DIAGNOSIS — I2510 Atherosclerotic heart disease of native coronary artery without angina pectoris: Secondary | ICD-10-CM

## 2016-12-10 DIAGNOSIS — G4733 Obstructive sleep apnea (adult) (pediatric): Secondary | ICD-10-CM | POA: Diagnosis not present

## 2016-12-10 DIAGNOSIS — R600 Localized edema: Secondary | ICD-10-CM | POA: Diagnosis not present

## 2016-12-10 DIAGNOSIS — I10 Essential (primary) hypertension: Secondary | ICD-10-CM

## 2016-12-10 DIAGNOSIS — E782 Mixed hyperlipidemia: Secondary | ICD-10-CM

## 2016-12-10 DIAGNOSIS — I4819 Other persistent atrial fibrillation: Secondary | ICD-10-CM

## 2016-12-10 MED ORDER — CARVEDILOL 12.5 MG PO TABS: 12.5000 mg | ORAL_TABLET | Freq: Two times a day (BID) | ORAL | 6 refills | Status: DC

## 2016-12-10 NOTE — Patient Instructions (Signed)
Medication Instructions:    Your physician has recommended you make the following change in your medication:  1) STOP Atenolol 2) START Carvedilol 12.5 mg twice daily  - If you need a refill on your cardiac medications before your next appointment, please call your pharmacy.   Labwork:  Today: BNP & BMET  Testing/Procedures:  None ordered  Follow-Up:  Your physician wants you to follow-up in: 6 months with Dr. Curt Bears.  You will receive a reminder letter in the mail two months in advance. If you don't receive a letter, please call our office to schedule the follow-up appointment.  Thank you for choosing [facility]!!   Trinidad Curet, RN [phone]  Any Other Special Instructions Will Be Listed Below (If Applicable).  Carvedilol tablets What is this medicine? CARVEDILOL (KAR ve dil ol) is a beta-blocker. Beta-blockers reduce the workload on the heart and help it to beat more regularly. This medicine is used to treat high blood pressure and heart failure. This medicine may be used for other purposes; ask your health care provider or pharmacist if you have questions. COMMON BRAND NAME(S): Coreg What should I tell my health care provider before I take this medicine? They need to know if you have any of these conditions: -circulation problems -diabetes -history of heart attack or heart disease -liver disease -lung or breathing disease, like asthma or emphysema -pheochromocytoma -slow or irregular heartbeat -thyroid disease -an unusual or allergic reaction to carvedilol, other beta-blockers, medicines, foods, dyes, or preservatives -pregnant or trying to get pregnant -breast-feeding How should I use this medicine? Take this medicine by mouth with a glass of water. Follow the directions on the prescription label. It is best to take the tablets with food. Take your doses at regular intervals. Do not take your medicine more often than directed. Do not stop taking except on  the advice of your doctor or health care professional. Talk to your pediatrician regarding the use of this medicine in children. Special care may be needed. Overdosage: If you think you have taken too much of this medicine contact a poison control center or emergency room at once. NOTE: This medicine is only for you. Do not share this medicine with others. What if I miss a dose? If you miss a dose, take it as soon as you can. If it is almost time for your next dose, take only that dose. Do not take double or extra doses. What may interact with this medicine? This medicine may interact with the following medications: -certain medicines for blood pressure, heart disease, irregular heart beat -certain medicines for depression, like fluoxetine or paroxetine -certain medicines for diabetes, like glipizide or glyburide -cimetidine -clonidine -cyclosporine -digoxin -MAOIs like Carbex, Eldepryl, Marplan, Nardil, and Parnate -reserpine -rifampin This list may not describe all possible interactions. Give your health care provider a list of all the medicines, herbs, non-prescription drugs, or dietary supplements you use. Also tell them if you smoke, drink alcohol, or use illegal drugs. Some items may interact with your medicine. What should I watch for while using this medicine? Check your heart rate and blood pressure regularly while you are taking this medicine. Ask your doctor or health care professional what your heart rate and blood pressure should be, and when you should contact him or her. Do not stop taking this medicine suddenly. This could lead to serious heart-related effects. Contact your doctor or health care professional if you have difficulty breathing while taking this drug. Check your weight daily.  Ask your doctor or health care professional when you should notify him/her of any weight gain. You may get drowsy or dizzy. Do not drive, use machinery, or do anything that requires mental  alertness until you know how this medicine affects you. To reduce the risk of dizzy or fainting spells, do not sit or stand up quickly. Alcohol can make you more drowsy, and increase flushing and rapid heartbeats. Avoid alcoholic drinks. If you have diabetes, check your blood sugar as directed. Tell your doctor if you have changes in your blood sugar while you are taking this medicine. If you are going to have surgery, tell your doctor or health care professional that you are taking this medicine. What side effects may I notice from receiving this medicine? Side effects that you should report to your doctor or health care professional as soon as possible: -allergic reactions like skin rash, itching or hives, swelling of the face, lips, or tongue -breathing problems -dark urine -irregular heartbeat -swollen legs or ankles -vomiting -yellowing of the eyes or skin Side effects that usually do not require medical attention (report to your doctor or health care professional if they continue or are bothersome): -change in sex drive or performance -diarrhea -dry eyes (especially if wearing contact lenses) -dry, itching skin -headache -nausea -unusually tired This list may not describe all possible side effects. Call your doctor for medical advice about side effects. You may report side effects to FDA at 1-800-FDA-1088. Where should I keep my medicine? Keep out of the reach of children. Store at room temperature below 30 degrees C (86 degrees F). Protect from moisture. Keep container tightly closed. Throw away any unused medicine after the expiration date. NOTE: This sheet is a summary. It may not cover all possible information. If you have questions about this medicine, talk to your doctor, pharmacist, or health care provider.  2018 Elsevier/Gold Standard (2013-01-15 14:12:02)

## 2016-12-10 NOTE — Progress Notes (Signed)
Electrophysiology Office Note   Date:  12/10/2016   ID:  Christopher, Wade 1951/01/30, MRN [id number]  PCP:  [name], MD  Cardiologist:  Radford Pax Primary Electrophysiologist:  Christopher Wade Christopher Leeds, MD    Chief Complaint  Patient presents with  . Follow-up    PAF     History of Present Illness: Christopher Wade is a 66 y.o. male who presents today for electrophysiology evaluation.   He has a history of atrial fibrillation s/p DCCV 03/2015 to NSR but before discharge he went back into afib. He also has a history of HTN, dyslipidemia, DM, ASCAD cath showing 50% LM, 40% distal LAD, 70% ost to prox LCs, 40% mid LAD and 95% mid to dist RCA s/p PCI with DES x 2 and stage PCI of the LCx/OM. At last OV he was loaded with Amio and then he was set up to have DCCV which he successfully had done in July 2017. Repeat cardioversion 03/16/16. AF ablation 06/11/16.  Today, denies symptoms of palpitations, chest pain, shortness of breath, orthopnea, PND, claudication, dizziness, presyncope, syncope, bleeding, or neurologic sequela. The patient is tolerating medications without difficulties and is otherwise without complaint today. He has not had any further episodes of atrial fibrillation. He does note significant lower extremity edema. This occurred a few months ago. It is worse at the end of the day and better when he gets up from bed. He has no shortness of breath, PND, or orthopnea.  Past Medical History:  Diagnosis Date  . Arthritis    "joints get stiff" (06/11/2016)  . Bee sting allergy   . CAD (coronary artery disease), native coronary artery 02/12/2016   cath showing 50% LM, 40% distal LAD, 70% ost to prox LCs, 40% mid LAD and 95% mid to dist RCA s/p PCI with DES x 2 and stage PCI of the LCx/OM.  [name] History of gout   . Hyperlipidemia   . Hypertension   . Hypothyroidism   . OSA (obstructive sleep apnea)    "tried CPAP; couldn't do it" (06/11/2016)  . Persistent atrial fibrillation ([place])     s/p DCCV 03/2015 with reversion back to atrial fibrillation, loaded with Amio and s/p DCCV to NSR 11/2015.  He is now s/p afib albation.  He had amio lung toxicity and amio was stopped.  . Thyroid disease   . Type II diabetes mellitus ([place])   . Vitamin D deficiency    Past Surgical History:  Procedure Laterality Date  . CARDIAC CATHETERIZATION N/A 10/11/2015   Procedure: Left Heart Cath and Coronary Angiography;  Surgeon: [name], MD;  Location: [facility] [place] [facility];  Service: Cardiovascular;  Laterality: N/A;  . CARDIAC CATHETERIZATION N/A 10/11/2015   Procedure: Coronary Stent Intervention;  Surgeon: [name], MD;  Location: [place] [facility];  Service: Cardiovascular;  Laterality: N/A;  . CARDIAC CATHETERIZATION N/A 10/11/2015   Procedure: Intravascular Pressure Wire/FFR Study;  Surgeon: [name], MD;  Location: [place] [facility];  Service: Cardiovascular;  Laterality: N/A;  . CARDIAC CATHETERIZATION N/A 10/14/2015   Procedure: Coronary Stent Intervention;  Surgeon: [name], MD;  Location: [place] [facility];  Service: Cardiovascular;  Laterality: N/A;  . CARDIOVERSION N/A 04/05/2015   Procedure: CARDIOVERSION;  Surgeon: [name], MD;  Location: [facility];  Service: Cardiovascular;  Laterality: N/A;  . CARDIOVERSION N/A 12/09/2015   Procedure: CARDIOVERSION;  Surgeon: [name], MD;  Location: [place];  Service: Cardiovascular;  Laterality: N/A;  . CARDIOVERSION N/A 03/16/2016   Procedure: CARDIOVERSION;  Surgeon: [name], MD;  Location: [place];  Service: Cardiovascular;  Laterality: N/A;  . CORONARY ANGIOPLASTY    . ELECTROPHYSIOLOGIC STUDY N/A 06/11/2016   Procedure: Atrial Fibrillation Ablation;  Surgeon: Anandi Abramo Christopher Leeds, MD;  Location: [place] [facility];  Service: Cardiovascular;  Laterality: N/A;  . JOINT REPLACEMENT    . TOTAL HIP ARTHROPLASTY Bilateral 2001  . UVULECTOMY       Current Outpatient Prescriptions    Medication Sig Dispense Refill  . allopurinol (ZYLOPRIM) 300 MG tablet Take 150 mg by mouth daily.    [name] amLODipine (NORVASC) 5 MG tablet take 1 tablet by mouth once daily 30 tablet 11  . atorvastatin (LIPITOR) 80 MG tablet take 1 tablet by mouth once daily AT 6PM 90 tablet 0  . B Complex Vitamins (B COMPLEX PO) Take 1 tablet by mouth daily.    . cholecalciferol (VITAMIN D) 1000 UNITS tablet Take 1,000 Units by mouth daily.    . colchicine 0.6 MG tablet take 1 tablet by mouth once daily TO PREVENT GOUT 90 tablet 0  . HYDROcodone-acetaminophen (NORCO/VICODIN) 5-325 MG tablet Take 1 tablet by mouth every 6 (six) hours as needed for moderate pain. 60 tablet 0  . levothyroxine (SYNTHROID) 125 MCG tablet Take 1 tablet every morning on an empty stomach for 30 minutes and no antacids of 4 hours. 90 tablet 1  . loratadine (CLARITIN) 10 MG tablet Take 10 mg by mouth daily.    . magnesium gluconate (MAGONATE) 500 MG tablet Take 1,000 mg by mouth 2 (two) times daily.     . metFORMIN (GLUCOPHAGE) 500 MG tablet Take 1 to 2 tablets 2 x / day with food as directed for Diabetes 360 tablet 1  . nystatin cream (MYCOSTATIN) Apply 1 application topically 2 (two) times daily as needed (wound care). 30 g 1  . potassium chloride SA (KLOR-CON M20) 20 MEQ tablet Take 1 tablet (20 mEq total) by mouth daily. For 7 days 30 tablet 0  . valsartan (DIOVAN) 320 MG tablet take 1 tablet by mouth once daily 90 tablet 0  . XARELTO 20 MG TABS tablet take 1 tablet by mouth once daily WITH SUPPER 90 tablet 1  . carvedilol (COREG) 12.5 MG tablet Take 1 tablet (12.5 mg total) by mouth 2 (two) times daily. 60 tablet 6   No current facility-administered medications for this visit.     Allergies:   Amiodarone and Bee venom   Social History:  The patient  reports that he has never smoked. He has never used smokeless tobacco. He reports that he does not drink alcohol or use drugs.   Family History:  The patient's family history  includes Colon cancer in his sister; Lung cancer in his father and mother.    ROS:  Please see the history of present illness.   Otherwise, review of systems is positive for none.   All other systems are reviewed and negative.   PHYSICAL EXAM: VS:  BP (!) 156/90   Pulse 78   Ht 6' (1.829 m)   Wt 267 lb 3.2 oz (121.2 kg)   SpO2 92%   BMI 36.24 kg/m  , BMI Body mass index is 36.24 kg/m. GEN: Well nourished, well developed, in no acute distress  HEENT: normal  Neck: no JVD, carotid bruits, or masses Cardiac: RRR; no murmurs, rubs, or gallops, 1-2+ edema  Respiratory:  clear to auscultation bilaterally, normal work  of breathing GI: soft, nontender, nondistended, + BS MS: no deformity or atrophy  Skin: warm and dry Neuro:  Strength and sensation are intact Psych: euthymic mood, full affect  EKG:  EKG is not ordered today. Personal review of the ekg ordered 09/10/16 shows sinus rhythm, rate 61  Recent Labs: 11/30/2016: ALT 30; BUN 13; Creat 1.00; Hemoglobin 14.0; Magnesium 1.9; Platelets 258; Potassium 4.3; Sodium 140; TSH 2.77    Lipid Panel     Component Value Date/Time   CHOL 103 11/30/2016 1609   TRIG 94 11/30/2016 1609   HDL 42 11/30/2016 1609   CHOLHDL 2.5 11/30/2016 1609   VLDL 19 11/30/2016 1609   LDLCALC 42 11/30/2016 1609     Wt Readings from Last 3 Encounters:  12/10/16 267 lb 3.2 oz (121.2 kg)  11/30/16 266 lb 6.4 oz (120.8 kg)  09/10/16 261 lb 12.8 oz (118.8 kg)      Other studies Reviewed: Additional studies/ records that were reviewed today include: TTE 03/2015, Cath 10/14/15  Review of the above records today demonstrates:  - Left ventricle: The cavity size was normal. Wall thickness was   increased in a pattern of mild LVH. Systolic function was normal.   The estimated ejection fraction was in the range of 60% to 65%.   Indeterminant diastolic function (atrial fibrillation). Wall   motion was normal; there were no regional wall motion    abnormalities. - Aortic valve: There was no stenosis. - Aorta: Borderline dilated aortic root. - Mitral valve: There was trivial regurgitation. - Left atrium: The atrium was mildly dilated. - Right ventricle: The cavity size was moderately dilated. Systolic   function was normal. - Right atrium: The atrium was mildly dilated. - Tricuspid valve: Peak RV-RA gradient (S): 32 mm Hg. - Pulmonary arteries: PA peak pressure: 35 mm Hg (S). - Inferior vena cava: The vessel was normal in size. The   respirophasic diameter changes were in the normal range (>= 50%),   consistent with normal central venous pressure.   Ost 1st Mrg to 1st Mrg lesion to mid circumflex, 95% stenosed. Post intervention with a 2.75 x 32 Synergy, postdilated to 3.5 mm, there is a 0% residual stenosis.  The Synergy stent extends from the OM back into the native circumflex, jailing the mid circumflex. Postprocedure, there is TIMI-3 flow in the circumflex with only a moderate stenosis at the jailed portion.   Cath 10/11/15  LM lesion, 50% stenosed.  Dist LAD lesion, 40% stenosed.  Ost Cx to Prox Cx lesion, 70% stenosed.  1st Mrg lesion, 70% stenosed.  Mid LAD lesion, 40% stenosed.  Mid RCA to Dist RCA lesion, 95% stenosed. Post intervention, there is a 0% residual stenosis. The lesion was not previously treated.   ASSESSMENT AND PLAN:  1.  Persistent atrial fibrillation/flutter: On Xarelto. Ablation 06/11/16. Remains in sinus rhythm today. No changes at this time.  This patients CHA2DS2-VASc Score and unadjusted Ischemic Stroke Rate (% per year) is equal to 4.8 % stroke rate/year from a score of 4  Above score calculated as 1 point each if present [CHF, HTN, DM, Vascular=MI/PAD/Aortic Plaque, Age if 65-74, or Male] Above score calculated as 2 points each if present [Age > 75, or Stroke/TIA/TE]  2. Hypertension: Would pressure remains elevated today. Saki Legore stop his atenolol and start him on carvedilol for improved  blood pressure control.  3. Hyperlipidemia: Continue statin  4. CAD: Status post PCI to the circumflex and RCA. No current chest pain.  5.  OSA: Encouraged CPAP compliance  6. Lower extremity edema: New for the last few months. We'll check a basic metabolic and BNP today. Should this continue, he may benefit from an echocardiogram in the future.     Current medicines are reviewed at length with the patient today.   The patient does not have concerns regarding his medicines.  The following changes were made today:  Stop atenolol, start coreg  Labs/ tests ordered today include:  Orders Placed This Encounter  Procedures  . Pro b natriuretic peptide (BNP)  . Basic Metabolic Panel (BMET)     Disposition:   FU with Lanny Donoso 6 months  Signed, Madylin Fairbank Christopher Leeds, MD  12/10/2016 12:33 PM     [facility] [address] [place] [address] [phone] (office) [phone] (fax)

## 2016-12-11 ENCOUNTER — Other Ambulatory Visit: Payer: Self-pay | Admitting: Internal Medicine

## 2016-12-11 DIAGNOSIS — I2510 Atherosclerotic heart disease of native coronary artery without angina pectoris: Secondary | ICD-10-CM

## 2016-12-11 DIAGNOSIS — I10 Essential (primary) hypertension: Secondary | ICD-10-CM

## 2016-12-11 LAB — BASIC METABOLIC PANEL
BUN/Creatinine Ratio: 14 (ref 10–24)
BUN: 15 mg/dL (ref 8–27)
CO2: 24 mmol/L (ref 20–29)
CREATININE: 1.05 mg/dL (ref 0.76–1.27)
Calcium: 9.4 mg/dL (ref 8.6–10.2)
Chloride: 99 mmol/L (ref 96–106)
GFR calc Af Amer: 85 mL/min/{1.73_m2} (ref >59)
GFR, EST NON AFRICAN AMERICAN: 74 mL/min/{1.73_m2} (ref >59)
GLUCOSE: 157 mg/dL — AB (ref 65–99)
Potassium: 4.3 mmol/L (ref 3.5–5.2)
Sodium: 143 mmol/L (ref 134–144)

## 2016-12-11 LAB — PRO B NATRIURETIC PEPTIDE: NT-Pro BNP: 102 pg/mL (ref 0–376)

## 2016-12-11 MED ORDER — LOSARTAN POTASSIUM 100 MG PO TABS: ORAL_TABLET | ORAL | 1 refills | Status: DC

## 2016-12-15 ENCOUNTER — Encounter: Payer: Self-pay | Admitting: Cardiology

## 2016-12-15 NOTE — Telephone Encounter (Signed)
This encounter was created in error - please disregard.

## 2016-12-15 NOTE — Telephone Encounter (Signed)
New message    Pt is calling about his lab results.

## 2016-12-16 ENCOUNTER — Other Ambulatory Visit: Payer: Self-pay | Admitting: Internal Medicine

## 2017-01-29 ENCOUNTER — Ambulatory Visit (INDEPENDENT_AMBULATORY_CARE_PROVIDER_SITE_OTHER)
Admission: RE | Admit: 2017-01-29 | Discharge: 2017-01-29 | Disposition: A | Discharge status: Home / Self Care | Payer: Medicare Other | Source: Ambulatory Visit | Attending: Internal Medicine | Admitting: Internal Medicine

## 2017-01-29 DIAGNOSIS — J849 Interstitial pulmonary disease, unspecified: Secondary | ICD-10-CM

## 2017-01-29 DIAGNOSIS — K449 Diaphragmatic hernia without obstruction or gangrene: Secondary | ICD-10-CM | POA: Diagnosis not present

## 2017-02-02 ENCOUNTER — Ambulatory Visit (INDEPENDENT_AMBULATORY_CARE_PROVIDER_SITE_OTHER): Payer: Medicare Other | Admitting: Internal Medicine

## 2017-02-02 ENCOUNTER — Other Ambulatory Visit (INDEPENDENT_AMBULATORY_CARE_PROVIDER_SITE_OTHER): Payer: Medicare Other

## 2017-02-02 ENCOUNTER — Encounter: Payer: Self-pay | Admitting: Internal Medicine

## 2017-02-02 VITALS — BP 142/80 | HR 58 | Ht 72.0 in | Wt 261.6 lb

## 2017-02-02 DIAGNOSIS — J849 Interstitial pulmonary disease, unspecified: Secondary | ICD-10-CM

## 2017-02-02 DIAGNOSIS — I2510 Atherosclerotic heart disease of native coronary artery without angina pectoris: Secondary | ICD-10-CM | POA: Diagnosis not present

## 2017-02-02 LAB — CARDIAC PANEL
CK MB: 2.7 ng/mL (ref 0.3–4.0)
CK TOTAL: 135 U/L (ref 7–232)
RELATIVE INDEX: 2 calc (ref 0.0–2.5)

## 2017-02-02 LAB — SEDIMENTATION RATE: Sed Rate: 22 mm/hr — ABNORMAL HIGH (ref 0–20)

## 2017-02-02 NOTE — Addendum Note (Signed)
Addended by: Trenda Moots on: 7/70/3403 02:35 PM   Modules accepted: Orders

## 2017-02-02 NOTE — Addendum Note (Signed)
Addended by: Trenda Moots on: 7/50/5183 02:34 PM   Modules accepted: Orders
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
## 2017-02-02 NOTE — Addendum Note (Signed)
Addended by: Trenda Moots on: 4/38/3818 02:35 PM   Modules accepted: Orders

## 2017-02-02 NOTE — Addendum Note (Signed)
Addended by: Trenda Moots on: 06/03/3157 02:35 PM   Modules accepted: Orders

## 2017-02-02 NOTE — Addendum Note (Signed)
Addended by: Trenda Moots on: 5/63/8937 02:35 PM   Modules accepted: Orders

## 2017-02-02 NOTE — Patient Instructions (Signed)
ICD-10-CM   1. 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; non-smoker, Possible UIP v inconsisent with UIP with NO progression summer 2017 through summer 2018 J84.9      - I am concerned you have  have Interstitial Lung Disease (ILD)  -  There are MANY varieties of this - I do no think this is related to amiodarone - To narrow down possibilities and assess severity please do the following tests   - do autoimmune panel: Serum: ESR, ACE, ANA, DS-DNA, RF, anti-CCP, ssA, ssB, scl-70, ANCA, MPO and PR-3 antibodies, Total CK,  Aldolase,  Hypersensitivity Pneumonitis Panel  followup  - next few weeks - depending on results, give diagnosis v referral to surgical lung biopsy v continued observation

## 2017-02-02 NOTE — Addendum Note (Signed)
Addended by: [name] on: 02/02/2017 02:25 PM   Modules accepted: Orders

## 2017-02-02 NOTE — Addendum Note (Signed)
Addended by: Trenda Moots on: 9/37/1696 02:35 PM   Modules accepted: Orders

## 2017-02-02 NOTE — Progress Notes (Signed)
Christopher Wade would need

## 2017-02-02 NOTE — Addendum Note (Signed)
Addended by: Trenda Moots on: 9/72/8206 02:35 PM   Modules accepted: Orders

## 2017-02-02 NOTE — Addendum Note (Signed)
Addended by: Trenda Moots on: 09/27/1831 02:35 PM   Modules accepted: Orders

## 2017-02-02 NOTE — Addendum Note (Signed)
Addended by: Trenda Moots on: 7/61/9509 02:35 PM   Modules accepted: Orders

## 2017-02-02 NOTE — Addendum Note (Signed)
Addended by: Trenda Moots on: 6/83/4196 02:35 PM   Modules accepted: Orders

## 2017-02-04 ENCOUNTER — Telehealth: Payer: Self-pay | Admitting: Internal Medicine

## 2017-02-04 DIAGNOSIS — J849 Interstitial pulmonary disease, unspecified: Secondary | ICD-10-CM

## 2017-02-04 NOTE — Telephone Encounter (Signed)
Let Tereso Wade k now that   A) blood work is negative for autoimmune disease  B) I consulted 2nd radiologist and she too thinks overall stable since last summer. In fact she thinks patient might NOT have ILD.   C) So given stabiklty and lack of clarity if he definitely has ILD, I recommend holding off any biopsy qiestion now. Instead, in 3 months do Pre-bd spiro and dlco only. No lung volume or bd response. No post-bd spiro and come for followup  Dr. Brand Males, M.D., [facility].C.P Pulmonary and Critical Care Medicine Staff Physician [place] [facility] Pager: [phone], If no answer or between  15:00h - 7:00h: call [phone]  02/04/2017 7:35 PM

## 2017-02-06 LAB — ANGIOTENSIN CONVERTING ENZYME: Angiotensin-Converting Enzyme: 37 U/L (ref 9–67)

## 2017-02-06 LAB — HYPERSENSITIVITY PNUEMONITIS PROFILE
ASPERGILLUS FUMIGATUS: NEGATIVE
FAENIA RETIVIRGULA: NEGATIVE
Pigeon Serum: NEGATIVE
S. VIRIDIS: NEGATIVE
T. CANDIDUS: NEGATIVE
T. VULGARIS: NEGATIVE

## 2017-02-06 LAB — CYCLIC CITRUL PEPTIDE ANTIBODY, IGG

## 2017-02-06 LAB — MPO/PR-3 (ANCA) ANTIBODIES
Myeloperoxidase Abs: <1 AI
Serine Protease 3: <1 AI

## 2017-02-06 LAB — ANA: ANA: NEGATIVE

## 2017-02-06 LAB — RHEUMATOID FACTOR: Rhuematoid fact SerPl-aCnc: <14 IU/mL (ref <14)

## 2017-02-06 LAB — ALDOLASE: ALDOLASE: 4.6 U/L (ref <=8.1)

## 2017-02-06 LAB — ANTI-DNA ANTIBODY, DOUBLE-STRANDED

## 2017-02-06 LAB — SJOGRENS SYNDROME-A EXTRACTABLE NUCLEAR ANTIBODY: SSA (RO) (ENA) ANTIBODY, IGG: NEGATIVE AI

## 2017-02-06 LAB — ANCA SCREEN W REFLEX TITER: ANCA Screen: NEGATIVE

## 2017-02-06 LAB — SJOGRENS SYNDROME-B EXTRACTABLE NUCLEAR ANTIBODY: SSB (LA) (ENA) ANTIBODY, IGG: NEGATIVE AI

## 2017-02-09 ENCOUNTER — Telehealth: Payer: Self-pay | Admitting: Internal Medicine

## 2017-02-09 NOTE — Telephone Encounter (Signed)
Left message for pt to call us back x1 

## 2017-02-09 NOTE — Telephone Encounter (Signed)
Brand Males, MD      7:33 PM  Note     Let Christopher Wade k now that   A) blood work is negative for autoimmune disease  B) I consulted 2nd radiologist and she too thinks overall stable since last summer. In fact she thinks patient might NOT have ILD.   C) So given stabiklty and lack of clarity if he definitely has ILD, I recommend holding off any biopsy qiestion now. Instead, in 3 months do Pre-bd spiro and dlco only. No lung volume or bd response. No post-bd spiro and come for followup  Dr. Brand Males, M.D., [facility].C.P Pulmonary and Critical Care Medicine Staff Physician [place] [facility] Pager: [phone], If no answer or between  15:00h - 7:00h: call [phone]  02/04/2017 7:35 PM     Called pt and advised him of results. Can Daneil Dan help me with scheduling the test Mr is requesting and 3 month follow up? Thanks.

## 2017-02-11 NOTE — Telephone Encounter (Signed)
Recalls have been have been placed for PFT and ROV. Nothing further was needed.

## 2017-02-16 NOTE — Telephone Encounter (Signed)
Called and spoke to pt. Informed him of the results and recs per MR. Pt verbalized understanding. MR's schedule is not open for December yet so a recall was placed. Pt is aware to schedule both PFT and appt when calling back after receiving his recall letter. Nothing further needed. Will sign off.

## 2017-02-23 ENCOUNTER — Encounter (INDEPENDENT_AMBULATORY_CARE_PROVIDER_SITE_OTHER): Payer: Self-pay

## 2017-02-23 ENCOUNTER — Ambulatory Visit (INDEPENDENT_AMBULATORY_CARE_PROVIDER_SITE_OTHER): Payer: Medicare Other | Admitting: Cardiology

## 2017-02-23 ENCOUNTER — Encounter: Payer: Self-pay | Admitting: Cardiology

## 2017-02-23 VITALS — BP 174/88 | HR 70 | Ht 72.0 in | Wt 262.2 lb

## 2017-02-23 DIAGNOSIS — I2510 Atherosclerotic heart disease of native coronary artery without angina pectoris: Secondary | ICD-10-CM | POA: Diagnosis not present

## 2017-02-23 DIAGNOSIS — E7800 Pure hypercholesterolemia, unspecified: Secondary | ICD-10-CM | POA: Diagnosis not present

## 2017-02-23 DIAGNOSIS — I10 Essential (primary) hypertension: Secondary | ICD-10-CM

## 2017-02-23 DIAGNOSIS — I481 Persistent atrial fibrillation: Secondary | ICD-10-CM | POA: Diagnosis not present

## 2017-02-23 DIAGNOSIS — I4819 Other persistent atrial fibrillation: Secondary | ICD-10-CM

## 2017-02-23 NOTE — Patient Instructions (Addendum)
Medication Instructions:  Your physician recommends that you continue on your current medications as directed. Please refer to the Current Medication list given to you today.   Labwork: None ordered  Testing/Procedures: None ordered  Follow-Up: Your physician wants you to follow-up in: 6 months with [name]. You will receive a reminder letter in the mail two months in advance. If you don't receive a letter, please call our office to schedule the follow-up appointment.   Any Other Special Instructions Will Be Listed Below (If Applicable).  Check Blood Pressure Daily for 1 week and call the office with your readings.    If you need a refill on your cardiac medications before your next appointment, please call your pharmacy.

## 2017-02-23 NOTE — Progress Notes (Signed)
Cardiology Office Note:    Date:  02/23/2017   ID:  Christopher Wade, DOB 19-Feb-1951, MRN [id number]  PCP:  [name], MD  Cardiologist:  Fransico Him, MD   Referring MD: [name], MD   Chief Complaint  Patient presents with  . Atrial Fibrillation  . Hypertension  . Coronary Artery Disease  . Hyperlipidemia    History of Present Illness:    Christopher Wade is a 66 y.o. male with a hx of persistent atrial fibrillation s/p DCCV 03/2015 to NSR but before discharge he went back into afib.He was loaded with Amio and then he was set up to have DCCV which he successfully had done in July 2017. He ultimately underwent afib ablation 06/11/2016.  He was found to have early lung toxicity and his Amio was stopped. He also has a history of HTN, dyslipidemia, DM, ASCAD cath showing 50% LM, 40% distal LAD, 70% ost to prox LCX, 40% mid LAD and 95% mid to dist RCA s/p PCI with DES x 2 and stage PCI of the LCx/OM.    He is here today for followup and is doing well.  He denies any chest pain or pressure, SOB, DOE, PND, orthopnea, LE edema, dizziness, palpitations or syncope. He is compliant with his meds and is tolerating meds with no SE.    Past Medical History:  Diagnosis Date  . Arthritis    "joints get stiff" (06/11/2016)  . Bee sting allergy   . CAD (coronary artery disease), native coronary artery 02/12/2016   cath showing 50% LM, 40% distal LAD, 70% ost to prox LCs, 40% mid LAD and 95% mid to dist RCA s/p PCI with DES x 2 and stage PCI of the LCx/OM.  [name] History of gout   . Hyperlipidemia   . Hypertension   . Hypothyroidism   . OSA (obstructive sleep apnea)    "tried CPAP; couldn't do it" (06/11/2016)  . Persistent atrial fibrillation (Christopher Wade)    s/p DCCV 03/2015 with reversion back to atrial fibrillation, loaded with Amio and s/p DCCV to NSR 11/2015.  He is now s/p afib albation.  He had amio lung toxicity and amio was stopped.  . Thyroid disease   . Type II diabetes mellitus (Christopher Wade)    . Vitamin D deficiency     Past Surgical History:  Procedure Laterality Date  . CARDIAC CATHETERIZATION N/A 10/11/2015   Procedure: Left Heart Cath and Coronary Angiography;  Surgeon: [name], MD;  Location: [facility];  Service: Cardiovascular;  Laterality: N/A;  . CARDIAC CATHETERIZATION N/A 10/11/2015   Procedure: Coronary Stent Intervention;  Surgeon: [name], MD;  Location: [facility];  Service: Cardiovascular;  Laterality: N/A;  . CARDIAC CATHETERIZATION N/A 10/11/2015   Procedure: Intravascular Pressure Wire/FFR Study;  Surgeon: [name], MD;  Location: [facility];  Service: Cardiovascular;  Laterality: N/A;  . CARDIAC CATHETERIZATION N/A 10/14/2015   Procedure: Coronary Stent Intervention;  Surgeon: [name], MD;  Location: [facility];  Service: Cardiovascular;  Laterality: N/A;  . CARDIOVERSION N/A 04/05/2015   Procedure: CARDIOVERSION;  Surgeon: [name], MD;  Location: [facility];  Service: Cardiovascular;  Laterality: N/A;  . CARDIOVERSION N/A 12/09/2015   Procedure: CARDIOVERSION;  Surgeon: [name], MD;  Location: [facility];  Service: Cardiovascular;  Laterality: N/A;  . CARDIOVERSION N/A 03/16/2016   Procedure: CARDIOVERSION;  Surgeon: [name], MD;  Location: [facility];  Service: Cardiovascular;  Laterality: N/A;  . CORONARY ANGIOPLASTY    . ELECTROPHYSIOLOGIC STUDY N/A 06/11/2016   Procedure: Atrial Fibrillation Ablation;  Surgeon: Will [name], MD;  Location: [facility];  Service: Cardiovascular;  Laterality: N/A;  . JOINT REPLACEMENT    . TOTAL HIP ARTHROPLASTY Bilateral 2001  . UVULECTOMY      Current Medications: Current Meds  Medication Sig  . allopurinol (ZYLOPRIM) 300 MG tablet Take 150 mg by mouth daily.  [name] amLODipine (NORVASC) 5 MG tablet take 1 tablet by mouth once daily  . atenolol (TENORMIN) 25 MG tablet   . atorvastatin (LIPITOR) 80 MG tablet take 1  tablet by mouth once daily AT 6PM  . B Complex Vitamins (B COMPLEX PO) Take 1 tablet by mouth daily.  . carvedilol (COREG) 12.5 MG tablet Take 1 tablet (12.5 mg total) by mouth 2 (two) times daily.  . cholecalciferol (VITAMIN D) 1000 UNITS tablet Take 1,000 Units by mouth daily.  . colchicine 0.6 MG tablet take 1 tablet by mouth once daily TO PREVENT GOUT  . HYDROcodone-acetaminophen (NORCO/VICODIN) 5-325 MG tablet Take 1 tablet by mouth every 6 (six) hours as needed for moderate pain.  [name] levothyroxine (SYNTHROID) 125 MCG tablet Take 1 tablet every morning on an empty stomach for 30 minutes and no antacids of 4 hours.  [name] loratadine (CLARITIN) 10 MG tablet Take 10 mg by mouth daily.  [name] losartan (COZAAR) 100 MG tablet Take 1 tablet daily for BP, Heart & Kidneys (replace Diovan/Valsartan)  . magnesium gluconate (MAGONATE) 500 MG tablet Take 1,000 mg by mouth 2 (two) times daily.   [name] nystatin cream (MYCOSTATIN) Apply 1 application topically 2 (two) times daily as needed (wound care).  [name] 20 MG TABS tablet take 1 tablet by mouth once daily WITH SUPPER     Allergies:   Amiodarone and Bee venom   Social History   Social History  . Marital status: Single    Spouse name: N/A  . Number of children: N/A  . Years of education: N/A   Social History Main Topics  . Smoking status: Never Smoker  . Smokeless tobacco: Never Used  . Alcohol use No  . Drug use: No  . Sexual activity: Not Currently   Other Topics Concern  . None   Social History Animal nutritionist      Family History: The patient's family history includes Colon cancer in his sister; Lung cancer in his father and mother.  ROS:   Please see the history of present illness.    ROS  All other systems reviewed and negative.   EKGs/Labs/Other Studies Reviewed:    The following studies were reviewed today: none  EKG:  EKG is not ordered today.   Recent Labs: 11/30/2016: ALT 30; Hemoglobin 14.0; Magnesium 1.9;  Platelets 258; TSH 2.77 12/10/2016: BUN 15; Creatinine, Ser 1.05; NT-Pro BNP 102; Potassium 4.3; Sodium 143   Recent Lipid Panel    Component Value Date/Time   CHOL 103 11/30/2016 1609   TRIG 94 11/30/2016 1609   HDL 42 11/30/2016 1609   CHOLHDL 2.5 11/30/2016 1609   VLDL 19 11/30/2016 1609   LDLCALC 42 11/30/2016 1609    Physical Exam:    VS:  BP (!) 174/88   Pulse 70   Ht 6' (1.829 m)   Wt 262 lb 3.2 oz (118.9 kg)   SpO2 96%   BMI 35.56 kg/m     Wt Readings from Last 3 Encounters:  02/23/17 262  lb 3.2 oz (118.9 kg)  02/02/17 261 lb 9.6 oz (118.7 kg)  12/10/16 267 lb 3.2 oz (121.2 kg)     GEN:  Well nourished, well developed in no acute distress HEENT: Normal NECK: No JVD; No carotid bruits LYMPHATICS: No lymphadenopathy CARDIAC: RRR, no murmurs, rubs, gallops RESPIRATORY:  Clear to auscultation without rales, wheezing or rhonchi  ABDOMEN: Soft, non-tender, non-distended MUSCULOSKELETAL:  No edema; No deformity  SKIN: Warm and dry NEUROLOGIC:  Alert and oriented x 3 PSYCHIATRIC:  Normal affect   ASSESSMENT:    1. Coronary artery disease involving native coronary artery of native heart without angina pectoris   2. Essential hypertension   3. Persistent atrial fibrillation ([place])   4. Pure hypercholesterolemia    PLAN:    In order of problems listed above:  1.  ASCAD -  cath showed 50% LM, 40% distal LAD, 70% ost to prox LCX, 40% mid LAD and 95% mid to dist RCA s/p PCI with DES x 2 and stage PCI of the LCx/OM.  He denies any recent anginal symptoms.  He will continue on BB, amlodipine and high dose statin.  He is not on ASA due to DOAC.    2.  HTN - BP poorly controlled on exam today.  His BP this am at home was 133/57mmHg.  He will continue on Atenolol, Amodipine 5mg  daily, carvedilol 12.5mg  BID and Losartan 100mg  daily.  I have asked him to check his BP daily for a week and call with the results.   3.  Persistent atrial fibrillation - he is s/p afib ablation  05/2016 and now off Amio due to Amio lung toxicity.  He is maintaining NSR.  He will continue on Atenolol 25mg  daily, carvedilol 12.5mg  BID and Xarelto 20mg  daily.    4.  Hyperlipidemia with LDL goal < 70.  He will continue on high dose atorvastatin 80mg  daily. His LDL in July 2018 was at goal at 69.     Medication Adjustments/Labs and Tests Ordered: Current medicines are reviewed at length with the patient today.  Concerns regarding medicines are outlined above.  No orders of the defined types were placed in this encounter.  No orders of the defined types were placed in this encounter.   Signed, Fransico Him, MD  02/23/2017 1:25 PM    [facility]

## 2017-02-24 DIAGNOSIS — N189 Chronic kidney disease, unspecified: Secondary | ICD-10-CM | POA: Diagnosis not present

## 2017-02-24 DIAGNOSIS — Z6835 Body mass index (BMI) 35.0-35.9, adult: Secondary | ICD-10-CM | POA: Diagnosis not present

## 2017-02-24 DIAGNOSIS — I4891 Unspecified atrial fibrillation: Secondary | ICD-10-CM | POA: Diagnosis not present

## 2017-02-24 DIAGNOSIS — I129 Hypertensive chronic kidney disease with stage 1 through stage 4 chronic kidney disease, or unspecified chronic kidney disease: Secondary | ICD-10-CM | POA: Diagnosis not present

## 2017-02-24 DIAGNOSIS — E785 Hyperlipidemia, unspecified: Secondary | ICD-10-CM | POA: Diagnosis not present

## 2017-03-03 ENCOUNTER — Ambulatory Visit: Payer: Self-pay | Admitting: Internal Medicine

## 2017-03-03 ENCOUNTER — Encounter: Payer: Self-pay | Admitting: Adult Health

## 2017-03-03 ENCOUNTER — Ambulatory Visit (INDEPENDENT_AMBULATORY_CARE_PROVIDER_SITE_OTHER): Payer: Medicare Other | Admitting: Adult Health

## 2017-03-03 VITALS — BP 136/83 | HR 69 | Temp 97.9°F | Resp 16 | Ht 72.0 in | Wt 253.6 lb

## 2017-03-03 DIAGNOSIS — E079 Disorder of thyroid, unspecified: Secondary | ICD-10-CM | POA: Diagnosis not present

## 2017-03-03 DIAGNOSIS — E1122 Type 2 diabetes mellitus with diabetic chronic kidney disease: Secondary | ICD-10-CM

## 2017-03-03 DIAGNOSIS — I10 Essential (primary) hypertension: Secondary | ICD-10-CM | POA: Diagnosis not present

## 2017-03-03 DIAGNOSIS — Z79899 Other long term (current) drug therapy: Secondary | ICD-10-CM

## 2017-03-03 DIAGNOSIS — E559 Vitamin D deficiency, unspecified: Secondary | ICD-10-CM | POA: Diagnosis not present

## 2017-03-03 DIAGNOSIS — N182 Chronic kidney disease, stage 2 (mild): Secondary | ICD-10-CM

## 2017-03-03 DIAGNOSIS — M1A9XX0 Chronic gout, unspecified, without tophus (tophi): Secondary | ICD-10-CM

## 2017-03-03 DIAGNOSIS — E7800 Pure hypercholesterolemia, unspecified: Secondary | ICD-10-CM

## 2017-03-03 NOTE — Progress Notes (Signed)
FOLLOW UP  Assessment and Plan:    Hypertension  Continue medication: amlodipine 5 mg daily, atenolol 25 mg daily, losartan 100 mg daily  Monitor blood pressure at home.  Continue DASH diet.    Reminder to go to the ER if any CP, SOB, nausea, dizziness, severe HA, changes vision/speech, left arm numbness and tingling and jaw pain.  Cholesterol  Continue medications: atorvastatin 80 mg tab daily  Continue diet and exercise.   Check lipid panel.   Prediabetes   Continue medications: metformin 500 mg 1 tab AM, 2 tab PM- may increase to 2 tabs in AM pending lab results  Continue diet and exercise.   Perform daily foot/skin check, notify office of any concerning changes.   Check A1C  Vitamin D Def  Increase medications: to 2000 IU daily  Check Vit D level  Hypothyroidism  continue medications the same: synthroid 125 mg tab daily  reminded to take on an empty stomach 30-58mins before food.   check TSH level  Gout  Continue medications: allopurinol 150 mg daily, colchicine PRN  Diet discussed  Check uric acid as needed    Continue diet and meds as discussed. Further disposition pending results of labs. Discussed med's effects and SE's.   Over 30 minutes of exam, counseling, chart review, and critical decision making was performed.   Future Appointments Date Time Provider [place]  06/09/2017 3:00 PM Vicie Mutters, PA-C [id number] None    ----------------------------------------------------------------------------------------------------------------------  HPI 66 y.o. male  presents for 6 month follow up on hypertension, cholesterol, prediabetes, thyroid, gout and vitamin D deficiency. He has no concerns today.   His blood pressure has been controlled at home (130s/80s) after adjustment of medications by cardiology, today their BP is BP: 136/83   He does workout. He denies chest pain, shortness of breath, dizziness.   He is on cholesterol  medication and denies myalgias. His cholesterol is at goal. The cholesterol last visit was:   Lab Results  Component Value Date   CHOL 103 11/30/2016   HDL 42 11/30/2016   LDLCALC 42 11/30/2016   TRIG 94 11/30/2016   CHOLHDL 2.5 11/30/2016    He has been working on diet and exercise for prediabetes, and denies hyperglycemia, hypoglycemia , paresthesia of the feet, polydipsia, polyuria and visual disturbances. Last A1C in the office was trending up and above goal:  Lab Results  Component Value Date   HGBA1C 7.4 (H) 11/30/2016   Patient is on Vitamin D supplement but remains below goal:    Lab Results  Component Value Date   VD25OH 53 08/24/2016     He is on thyroid medication. His medication was not changed last visit.   Lab Results  Component Value Date   TSH 2.77 11/30/2016  .      Current Medications:  Current Outpatient Prescriptions on File Prior to Visit  Medication Sig  . allopurinol (ZYLOPRIM) 300 MG tablet Take 150 mg by mouth daily.  [name] amLODipine (NORVASC) 5 MG tablet take 1 tablet by mouth once daily  . atenolol (TENORMIN) 25 MG tablet   . atorvastatin (LIPITOR) 80 MG tablet take 1 tablet by mouth once daily AT 6PM  . B Complex Vitamins (B COMPLEX PO) Take 1 tablet by mouth daily.  . carvedilol (COREG) 12.5 MG tablet Take 1 tablet (12.5 mg total) by mouth 2 (two) times daily.  . cholecalciferol (VITAMIN D) 1000 UNITS tablet Take 1,000 Units by mouth daily.  . colchicine 0.6 MG tablet  take 1 tablet by mouth once daily TO PREVENT GOUT  . levothyroxine (SYNTHROID) 125 MCG tablet Take 1 tablet every morning on an empty stomach for 30 minutes and no antacids of 4 hours.  [name] loratadine (CLARITIN) 10 MG tablet Take 10 mg by mouth daily.  [name] losartan (COZAAR) 100 MG tablet Take 1 tablet daily for BP, Heart & Kidneys (replace Diovan/Valsartan)  . magnesium gluconate (MAGONATE) 500 MG tablet Take 1,000 mg by mouth 2 (two) times daily.   [name] nystatin cream (MYCOSTATIN) Apply 1  application topically 2 (two) times daily as needed (wound care).  [name] 20 MG TABS tablet take 1 tablet by mouth once daily WITH SUPPER  . metFORMIN (GLUCOPHAGE) 500 MG tablet Take 1 to 2 tablets 2 x / day with food as directed for Diabetes   No current facility-administered medications on file prior to visit.      Allergies:  Allergies  Allergen Reactions  . Amiodarone Other (See Comments)    Lung toxicity  . Bee Venom Other (See Comments)    Leg went numb     Medical History:  Past Medical History:  Diagnosis Date  . Arthritis    "joints get stiff" (06/11/2016)  . Bee sting allergy   . CAD (coronary artery disease), native coronary artery 02/12/2016   cath showing 50% LM, 40% distal LAD, 70% ost to prox LCs, 40% mid LAD and 95% mid to dist RCA s/p PCI with DES x 2 and stage PCI of the LCx/OM.  [name] History of gout   . Hyperlipidemia   . Hypertension   . Hypothyroidism   . OSA (obstructive sleep apnea)    "tried CPAP; couldn't do it" (06/11/2016)  . Persistent atrial fibrillation ([place])    s/p DCCV 03/2015 with reversion back to atrial fibrillation, loaded with Amio and s/p DCCV to NSR 11/2015.  He is now s/p afib albation.  He had amio lung toxicity and amio was stopped.  . Thyroid disease   . Type II diabetes mellitus ([place])   . Vitamin D deficiency    Family history- Reviewed and unchanged Social history- Reviewed and unchanged   Review of Systems:  Review of Systems  Constitutional: Negative.   HENT: Negative for congestion, hearing loss and sore throat.   Eyes: Negative for blurred vision.  Respiratory: Negative for cough, shortness of breath and wheezing.   Cardiovascular: Negative for chest pain, palpitations, orthopnea, claudication and leg swelling.  Gastrointestinal: Negative for abdominal pain, blood in stool, constipation, diarrhea, heartburn, melena, nausea and vomiting.  Genitourinary: Negative.   Musculoskeletal: Negative for myalgias.  Skin: Negative.    Neurological: Negative for dizziness, tingling, sensory change and headaches.  Endo/Heme/Allergies: Negative.   Psychiatric/Behavioral: Negative.       Physical Exam: BP 136/83 (BP Location: Right Arm, Patient Position: Sitting, Cuff Size: Large)   Pulse 69   Temp 97.9 F (36.6 C)   Resp 16   Ht 6' (1.829 m)   Wt 253 lb 9.6 oz (115 kg)   SpO2 97%   BMI 34.39 kg/m  Wt Readings from Last 3 Encounters:  03/03/17 253 lb 9.6 oz (115 kg)  02/23/17 262 lb 3.2 oz (118.9 kg)  02/02/17 261 lb 9.6 oz (118.7 kg)   General Appearance: Well nourished, in no apparent distress. Eyes: PERRLA, EOMs, conjunctiva no swelling or erythema Sinuses: No Frontal/maxillary tenderness ENT/Mouth: Ext aud canals clear, TMs without erythema, bulging. No erythema, swelling, or exudate on post pharynx.  Tonsils not  swollen or erythematous. Hearing normal.  Neck: Supple, thyroid normal.  Respiratory: Respiratory effort normal, BS equal bilaterally without rales, rhonchi, wheezing or stridor.  Cardio: RRR with no MRGs. Brisk peripheral pulses without edema.  Abdomen: Soft, + BS.  Non tender, no guarding, rebound, hernias, masses. Lymphatics: Non tender without lymphadenopathy.  Musculoskeletal: Full ROM, 5/5 strength, Normal gait Skin: Warm, dry without rashes, lesions, ecchymosis.  Neuro: Cranial nerves intact. No cerebellar symptoms.  Psych: Awake and oriented X 3, normal affect, Insight and Judgment appropriate.    Izora Ribas, NP 4:59 PM [facility]

## 2017-03-03 NOTE — Patient Instructions (Signed)

## 2017-03-04 LAB — CBC WITH DIFFERENTIAL/PLATELET
BASOS ABS: 56 {cells}/uL (ref 0–200)
Basophils Relative: 0.6 %
EOS ABS: 216 {cells}/uL (ref 15–500)
EOS PCT: 2.3 %
HCT: 42.4 % (ref 38.5–50.0)
Hemoglobin: 14.3 g/dL (ref 13.2–17.1)
Lymphs Abs: 1523 cells/uL (ref 850–3900)
MCH: 30.8 pg (ref 27.0–33.0)
MCHC: 33.7 g/dL (ref 32.0–36.0)
MCV: 91.4 fL (ref 80.0–100.0)
MONOS PCT: 10 %
MPV: 10.5 fL (ref 7.5–12.5)
NEUTROS ABS: 6665 {cells}/uL (ref 1500–7800)
NEUTROS PCT: 70.9 %
PLATELETS: 251 10*3/uL (ref 140–400)
RBC: 4.64 10*6/uL (ref 4.20–5.80)
RDW: 15.2 % — AB (ref 11.0–15.0)
TOTAL LYMPHOCYTE: 16.2 %
WBC mixed population: 940 cells/uL (ref 200–950)
WBC: 9.4 10*3/uL (ref 3.8–10.8)

## 2017-03-04 LAB — BASIC METABOLIC PANEL WITH GFR
BUN / CREAT RATIO: 13 (calc) (ref 6–22)
BUN: 17 mg/dL (ref 7–25)
CO2: 29 mmol/L (ref 20–32)
Calcium: 9.4 mg/dL (ref 8.6–10.3)
Chloride: 101 mmol/L (ref 98–110)
Creat: 1.26 mg/dL — ABNORMAL HIGH (ref 0.70–1.25)
GFR, Est African American: 68 mL/min/{1.73_m2} (ref >=60)
GFR, Est Non African American: 59 mL/min/{1.73_m2} — ABNORMAL LOW (ref >=60)
GLUCOSE: 110 mg/dL — AB (ref 65–99)
Potassium: 3.9 mmol/L (ref 3.5–5.3)
SODIUM: 140 mmol/L (ref 135–146)

## 2017-03-04 LAB — HEMOGLOBIN A1C
HEMOGLOBIN A1C: 7 %{Hb} — AB (ref <5.7)
Mean Plasma Glucose: 154 (calc)
eAG (mmol/L): 8.5 (calc)

## 2017-03-04 LAB — URIC ACID: Uric Acid, Serum: 5.2 mg/dL (ref 4.0–8.0)

## 2017-03-04 LAB — LIPID PANEL
Cholesterol: 91 mg/dL (ref <200)
HDL: 36 mg/dL — AB (ref >40)
LDL Cholesterol (Calc): 33 mg/dL (calc)
NON-HDL CHOLESTEROL (CALC): 55 mg/dL (ref <130)
TRIGLYCERIDES: 139 mg/dL (ref <150)
Total CHOL/HDL Ratio: 2.5 (calc) (ref <5.0)

## 2017-03-04 LAB — HEPATIC FUNCTION PANEL
AG RATIO: 1.3 (calc) (ref 1.0–2.5)
ALKALINE PHOSPHATASE (APISO): 98 U/L (ref 40–115)
ALT: 35 U/L (ref 9–46)
AST: 32 U/L (ref 10–35)
Albumin: 4.2 g/dL (ref 3.6–5.1)
BILIRUBIN INDIRECT: 0.8 mg/dL (ref 0.2–1.2)
Bilirubin, Direct: 0.2 mg/dL (ref 0.0–0.2)
Globulin: 3.3 g/dL (calc) (ref 1.9–3.7)
TOTAL PROTEIN: 7.5 g/dL (ref 6.1–8.1)
Total Bilirubin: 1 mg/dL (ref 0.2–1.2)

## 2017-03-04 LAB — TSH: TSH: 1.42 m[IU]/L (ref 0.40–4.50)

## 2017-03-04 LAB — MAGNESIUM: MAGNESIUM: 1.9 mg/dL (ref 1.5–2.5)

## 2017-03-08 NOTE — Progress Notes (Signed)
LVM for pt to return office call for LAB results.

## 2017-03-09 NOTE — Progress Notes (Signed)
Pt aware of lab results & voiced understanding of those results.

## 2017-03-17 ENCOUNTER — Encounter: Payer: Self-pay | Admitting: *Deleted

## 2017-03-18 ENCOUNTER — Other Ambulatory Visit: Payer: Self-pay | Admitting: Internal Medicine

## 2017-03-23 ENCOUNTER — Encounter: Payer: Self-pay | Admitting: Internal Medicine

## 2017-03-24 ENCOUNTER — Encounter: Payer: Self-pay | Admitting: Internal Medicine

## 2017-03-25 ENCOUNTER — Ambulatory Visit: Payer: Medicare Other

## 2017-03-27 ENCOUNTER — Other Ambulatory Visit: Payer: Self-pay | Admitting: Internal Medicine

## 2017-03-27 DIAGNOSIS — E039 Hypothyroidism, unspecified: Secondary | ICD-10-CM

## 2017-03-28 ENCOUNTER — Other Ambulatory Visit: Payer: Self-pay | Admitting: Internal Medicine

## 2017-05-19 DIAGNOSIS — Z91030 Bee allergy status: Secondary | ICD-10-CM | POA: Insufficient documentation

## 2017-05-19 DIAGNOSIS — E039 Hypothyroidism, unspecified: Secondary | ICD-10-CM | POA: Insufficient documentation

## 2017-05-19 DIAGNOSIS — Z8739 Personal history of other diseases of the musculoskeletal system and connective tissue: Secondary | ICD-10-CM | POA: Insufficient documentation

## 2017-05-19 DIAGNOSIS — I10 Essential (primary) hypertension: Secondary | ICD-10-CM | POA: Insufficient documentation

## 2017-05-19 DIAGNOSIS — M1990 Unspecified osteoarthritis, unspecified site: Secondary | ICD-10-CM | POA: Insufficient documentation

## 2017-05-19 DIAGNOSIS — E119 Type 2 diabetes mellitus without complications: Secondary | ICD-10-CM | POA: Insufficient documentation

## 2017-05-25 HISTORY — PX: CATARACT EXTRACTION: SUR2

## 2017-05-31 ENCOUNTER — Encounter: Payer: Self-pay | Admitting: Physician Assistant

## 2017-06-03 ENCOUNTER — Other Ambulatory Visit: Payer: Self-pay | Admitting: Internal Medicine

## 2017-06-03 DIAGNOSIS — I2510 Atherosclerotic heart disease of native coronary artery without angina pectoris: Secondary | ICD-10-CM

## 2017-06-03 DIAGNOSIS — I10 Essential (primary) hypertension: Secondary | ICD-10-CM

## 2017-06-09 ENCOUNTER — Ambulatory Visit (INDEPENDENT_AMBULATORY_CARE_PROVIDER_SITE_OTHER): Payer: Medicare Other | Admitting: Physician Assistant

## 2017-06-09 ENCOUNTER — Encounter: Payer: Self-pay | Admitting: Cardiology

## 2017-06-09 ENCOUNTER — Encounter: Payer: Self-pay | Admitting: Physician Assistant

## 2017-06-09 ENCOUNTER — Ambulatory Visit (INDEPENDENT_AMBULATORY_CARE_PROVIDER_SITE_OTHER): Payer: Medicare Other | Admitting: Cardiology

## 2017-06-09 ENCOUNTER — Encounter (INDEPENDENT_AMBULATORY_CARE_PROVIDER_SITE_OTHER): Payer: Self-pay

## 2017-06-09 VITALS — BP 152/80 | HR 80 | Temp 98.1°F | Resp 18 | Ht 72.0 in | Wt 252.2 lb

## 2017-06-09 VITALS — BP 142/82 | HR 67 | Ht 72.0 in | Wt 252.0 lb

## 2017-06-09 DIAGNOSIS — I4819 Other persistent atrial fibrillation: Secondary | ICD-10-CM

## 2017-06-09 DIAGNOSIS — M1990 Unspecified osteoarthritis, unspecified site: Secondary | ICD-10-CM

## 2017-06-09 DIAGNOSIS — E7800 Pure hypercholesterolemia, unspecified: Secondary | ICD-10-CM | POA: Diagnosis not present

## 2017-06-09 DIAGNOSIS — Z1159 Encounter for screening for other viral diseases: Secondary | ICD-10-CM

## 2017-06-09 DIAGNOSIS — R6889 Other general symptoms and signs: Secondary | ICD-10-CM | POA: Diagnosis not present

## 2017-06-09 DIAGNOSIS — I2510 Atherosclerotic heart disease of native coronary artery without angina pectoris: Secondary | ICD-10-CM

## 2017-06-09 DIAGNOSIS — Z79899 Other long term (current) drug therapy: Secondary | ICD-10-CM

## 2017-06-09 DIAGNOSIS — I481 Persistent atrial fibrillation: Secondary | ICD-10-CM

## 2017-06-09 DIAGNOSIS — I10 Essential (primary) hypertension: Secondary | ICD-10-CM

## 2017-06-09 DIAGNOSIS — E1122 Type 2 diabetes mellitus with diabetic chronic kidney disease: Secondary | ICD-10-CM | POA: Diagnosis not present

## 2017-06-09 DIAGNOSIS — E039 Hypothyroidism, unspecified: Secondary | ICD-10-CM | POA: Diagnosis not present

## 2017-06-09 DIAGNOSIS — G4733 Obstructive sleep apnea (adult) (pediatric): Secondary | ICD-10-CM | POA: Diagnosis not present

## 2017-06-09 DIAGNOSIS — Z0000 Encounter for general adult medical examination without abnormal findings: Secondary | ICD-10-CM

## 2017-06-09 DIAGNOSIS — J849 Interstitial pulmonary disease, unspecified: Secondary | ICD-10-CM

## 2017-06-09 DIAGNOSIS — N182 Chronic kidney disease, stage 2 (mild): Secondary | ICD-10-CM

## 2017-06-09 DIAGNOSIS — Z0001 Encounter for general adult medical examination with abnormal findings: Secondary | ICD-10-CM | POA: Diagnosis not present

## 2017-06-09 DIAGNOSIS — Z6834 Body mass index (BMI) 34.0-34.9, adult: Secondary | ICD-10-CM

## 2017-06-09 DIAGNOSIS — E559 Vitamin D deficiency, unspecified: Secondary | ICD-10-CM

## 2017-06-09 DIAGNOSIS — M1A9XX0 Chronic gout, unspecified, without tophus (tophi): Secondary | ICD-10-CM

## 2017-06-09 DIAGNOSIS — E079 Disorder of thyroid, unspecified: Secondary | ICD-10-CM

## 2017-06-09 MED ORDER — AMLODIPINE BESYLATE 10 MG PO TABS: 10.0000 mg | ORAL_TABLET | Freq: Every day | ORAL | 3 refills | Status: DC

## 2017-06-09 NOTE — Progress Notes (Signed)
MEDICARE VISIT and CPE  Assessment:    Essential hypertension - continue medications, DASH diet, exercise and monitor at home. Call if greater than 130/80.  WILL INCREASE NORVASC TO 10MG , WILL TAKE DOUBLE OF WHAT HE HAS AND WILL MONITOR FOR SWELLING - CBC with Differential/Platelet - BASIC METABOLIC PANEL WITH GFR - Hepatic function panel - Urinalysis, Routine w reflex microscopic (not at [facility]) - Microalbumin / creatinine urine ratio  Atrial fibrillation, persistent (HCC) Continue anticoagulation and cardio follow up. In NSR since ablation 1 year ago  Type 2 diabetes mellitus with stage 2 chronic kidney disease, without long-term current use of insulin ([place]) Discussed general issues about diabetes pathophysiology and management., Educational material distributed., Suggested low cholesterol diet., Encouraged aerobic exercise., Discussed foot care., Reminded to get yearly retinal exam. Continue ARB - Hemoglobin A1c  Hyperlipidemia -continue medications, check lipids, decrease fatty foods, increase activity.  - Lipid panel  Gout without tophus, unspecified cause, unspecified chronicity, unspecified site - Uric acid  OSA (obstructive sleep apnea) Emphasized need to try CPAP to help prevent Afib in the future  Thyroid disease Hypothyroidism-check TSH level, continue medications the same, reminded to take on an empty stomach 30-46mins before food.  - TSH  Vitamin D deficiency Continue supplement   Medication management - Magnesium  Encounter for long-term (current) use of medications   BPH without obstruction/lower urinary tract symptoms - PSA  Encounter for Medicare annual wellness exam 1 year Colonoscopy- patient declines a colonoscopy even though the risks and benefits were discussed at length. Colon cancer is 3rd most diagnosed cancer and 2nd leading cause of death in both men and women 7 years of age and older. Patient understands the risk of cancer and death with  declining the test however they are willing to do cologuard screening instead. They understand that this is not as sensitive or specific as a colonoscopy and they are still recommended to get a colonoscopy. The cologuard will be sent out to their house.   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 2018; non-smoker, Possible UIP with  summer 2017 through Feb 2018 Monitor  Morbid obesity ([place]) - follow up 3 months for progress monitoring - increase veggies, decrease carbs - long discussion about weight loss, diet, and exercise  BMI 34.0-34.9,adult  Need for hepatitis C screening test -     Hepatitis C antibody   Over 40 minutes of exam, counseling, chart review and critical decision making was performed  Plan:   During the course of the visit the patient was educated and counseled about appropriate screening and preventive services including:    Pneumococcal vaccine  Prevnar 13   Influenza vaccine  Td vaccine  Screening electrocardiogram  Bone densitometry screening  Colorectal cancer screening  Diabetes screening  Glaucoma screening  Nutrition counseling   Advanced directives: requested   Subjective:  Christopher Wade is a 67 y.o. male who presents for Welcome to medicare visit.    His blood pressure has been controlled at home, losartan 100, atenolol 25, norvasc 5mg , at home 120-130 on average, very rare will be in 150's, today their BP is  BP Readings from Last 6 Encounters:  06/09/17 (!) 152/80  06/09/17 (!) 142/82  03/03/17 136/83  02/23/17 (!) 174/88  02/02/17 (!) 142/80  12/10/16 (!) 156/90   He does not workout but is active at work. He denies chest pain, shortness of breath, dizziness.  He has history of ASCAD s/p  DES stent to LCx/OM. He was found to have Afib, follows with [name], had [place] 03/2015, second DCCV on 10/23 and now in NSR,had ablation with Dr. Curt Bears Jan 8th 2018 and has been in NSR since  that time. He has OSA is non compliant with CPAP, unable to tolerate.  He is on cholesterol medication and denies myalgias. His cholesterol is at goal. The cholesterol last visit was:   Lab Results  Component Value Date   CHOL 91 03/03/2017   HDL 36 (L) 03/03/2017   LDLCALC 42 11/30/2016   TRIG 139 03/03/2017   CHOLHDL 2.5 03/03/2017    He has been working on diet and exercise for diabetes with CKd, he is not on bASA due to xarelto, he is on ACE/ARB, he is on metformin 3 a day, and denies paresthesia of the feet, polydipsia, polyuria and visual disturbances. Last A1C in the office was:  Lab Results  Component Value Date   HGBA1C 7.0 (H) 03/03/2017   Lab Results  Component Value Date   GFRNONAA 67 (L) 03/03/2017  Patient is on Vitamin D supplement.   Lab Results  Component Value Date   VD25OH 22 08/24/2016     He is on thyroid medication. His medication was changed last visit, he is on 153mcg  Lab Results  Component Value Date   TSH 1.42 03/03/2017  .  Patient is on allopurinol for gout and does not report a recent flare.  Lab Results  Component Value Date   LABURIC 5.2 03/03/2017   BMI is Body mass index is 34.2 kg/m., he is working on diet and exercise. Wt Readings from Last 3 Encounters:  06/09/17 252 lb 3.2 oz (114.4 kg)  06/09/17 252 lb (114.3 kg)  03/03/17 253 lb 9.6 oz (115 kg)      Medication Review: Current Outpatient Medications on File Prior to Visit  Medication Sig  . allopurinol (ZYLOPRIM) 300 MG tablet Take 150 mg by mouth daily.  [name] amLODipine (NORVASC) 5 MG tablet take 1 tablet by mouth once daily  . atorvastatin (LIPITOR) 80 MG tablet take 1 tablet by mouth once daily AT 6PM  . B Complex Vitamins (B COMPLEX PO) Take 1 tablet by mouth daily.  . carvedilol (COREG) 12.5 MG tablet Take 1 tablet (12.5 mg total) by mouth 2 (two) times daily.  . cholecalciferol (VITAMIN D) 1000 UNITS tablet Take 2,000 Units by mouth daily.  . colchicine 0.6 MG tablet take 1  tablet by mouth once daily TO PREVENT GOUT  . levothyroxine (SYNTHROID, LEVOTHROID) 125 MCG tablet take 1 tablet by mouth every morning ON AN EMPTY STOMACH- WAIT 30 MINUTES TO EAT, NO ANTACIDS FOR 4 HOURS  . loratadine (CLARITIN) 10 MG tablet Take 10 mg by mouth daily.  [name] losartan (COZAAR) 100 MG tablet take 1 tablet by mouth once daily for blood pressure heart and KIDNEYS  . magnesium gluconate (MAGONATE) 500 MG tablet Take 1,000 mg by mouth 2 (two) times daily.   . metFORMIN (GLUCOPHAGE) 500 MG tablet take 1 to 2 tablets by mouth twice a day with food as directed for diabetes  . nystatin cream (MYCOSTATIN) Apply 1 application topically 2 (two) times daily as needed (wound care).  [name] 20 MG TABS tablet take 1 tablet by mouth once daily WITH SUPPER   No current facility-administered medications on file prior to visit.      Current Problems (verified) Patient Active Problem List   Diagnosis Date Noted  . Hypothyroidism   .  Arthritis   . 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 Persistent atrial fibrillation ([place])   . CAD (coronary artery disease), native coronary artery 02/12/2016  . Encounter for Medicare annual wellness exam 05/16/2015  . OSA (obstructive sleep apnea)   . Gout 11/12/2014  . Essential hypertension 11/12/2014  . Encounter for long-term (current) use of medications 07/31/2013  . Morbid obesity ([place]) 07/31/2013  . Hyperlipidemia   . DM type 2 causing CKD stage 2 ([place])   . Thyroid disease   . Vitamin D deficiency     Screening Tests Immunization History  Administered Date(s) Administered  . Influenza Split 04/05/2014  . Influenza-Unspecified 02/02/2013, 05/21/2016  . Pneumococcal Conjugate-13 05/01/2014  . Pneumococcal Polysaccharide-23 05/16/2015  . Td 04/25/2010  . Varicella Zoster Immune Globulin 02/22/2014   Preventative  care: Last colonoscopy: 2009, Dr. Carlean Purl, would prefer to try cologuard first Echo 03/2015, normal EF, mild LAE, moderate RV Cath 2017  Prior vaccinations: TD or Tdap: 2011 Influenza: 2018 Pneumococcal: 2016 Prevnar13: 2015 Shingles/Zostavax: 2015  Names of Other Physician/Practitioners you currently use: 1. [facility] here for primary care 2. [facility], eye doctor, last visit 10/2016 last [facility] 3.dentist, none  Patient Care Team: [name], MD as PCP - General (Internal Medicine) Gatha Mayer, MD as Consulting Physician (Gastroenterology) [name], MD as Consulting Physician (Cardiology)  Allergies Allergies  Allergen Reactions  . Amiodarone Other (See Comments)    Lung toxicity  . Bee Venom Other (See Comments)    Leg went numb    SURGICAL HISTORY He  has a past surgical history that includes Cardioversion (N/A, 04/05/2015); Cardioversion (N/A, 12/09/2015); Cardioversion (N/A, 03/16/2016); Cardiac catheterization (N/A, 06/11/2016); Total hip arthroplasty (Bilateral, 2001); Joint replacement; Uvulectomy; Cardiac catheterization (N/A, 10/11/2015); Cardiac catheterization (N/A, 10/11/2015); Cardiac catheterization (N/A, 10/11/2015); Cardiac catheterization (N/A, 10/14/2015); and Coronary angioplasty. FAMILY HISTORY His family history includes Colon cancer in his sister; Lung cancer in his father and mother. SOCIAL HISTORY He  reports that  has never smoked. he has never used smokeless tobacco. He reports that he does not drink alcohol or use drugs.  MEDICARE WELLNESS OBJECTIVES: Physical activity: Current Exercise Habits: The patient has a physically strenous job, but has no regular exercise apart from work. Cardiac risk factors: Cardiac Risk Factors include: advanced age (>58men, >74 women);diabetes mellitus;dyslipidemia;hypertension;male gender;obesity (BMI >30kg/m2) Depression/mood screen:    Depression screen [facility] 2/9 06/09/2017  Decreased Interest 0  Down, Depressed, Hopeless 0  PHQ - 2 Score 0    ADLs:  In your present state of health, do you have any difficulty performing the following activities: 06/09/2017 08/24/2016  Hearing? N N  Vision? N N  Difficulty concentrating or making decisions? N -  Walking or climbing stairs? N N  Dressing or bathing? N N  Doing errands, shopping? N N  Some recent data might be hidden     Cognitive Testing  Alert? Yes  Normal Appearance?Yes  Oriented to person? Yes  Place? Yes   Time? Yes  Recall of three objects?  Yes  Can perform simple calculations? Yes  Displays appropriate judgment?Yes  Can read the correct time from a watch face?Yes  EOL planning: Does Patient Have a Medical Advance Directive?: No Would patient like information on creating a medical advance directive?: Yes (MAU/Ambulatory/Procedural Areas - Information given)  Review of Systems  Constitutional: Negative.   HENT: Negative.   Eyes: Negative.   Respiratory: Negative.   Cardiovascular: Negative.   Gastrointestinal: Negative.   Genitourinary: Negative.   Musculoskeletal: Positive for joint pain (hit hand while moving things, in brace, but improving) and myalgias. Negative for back pain, falls and neck pain.  Skin: Negative.   Neurological: Negative.   Endo/Heme/Allergies: Negative.   Psychiatric/Behavioral: Negative.      Objective:     Today's Vitals   06/09/17 1454  BP: (!) 152/80  Pulse: 80  Resp: 18  Temp: 98.1 F (36.7 C)  SpO2: 96%  Weight: 252 lb 3.2 oz (114.4 kg)  Height: 6' (1.829 m)  PainSc: 0-No pain   Body mass index is 34.2 kg/m.  General appearance: alert, no distress, WD/WN, male HEENT: normocephalic, sclerae anicteric, TMs pearly, nares patent, no discharge or erythema, pharynx normal Oral cavity: MMM, no lesions Neck: supple, no lymphadenopathy, no thyromegaly, no masses Heart: NSR, normal S1, S2, no murmurs Lungs: CTA  bilaterally, no wheezes, rhonchi, or rales Abdomen: +bs, soft, obese, non tender, non distended, ventral hernia, no masses, no hepatomegaly, no splenomegaly Musculoskeletal: nontender, no swelling, no obvious deformity Extremities: 1+ edema, no cyanosis, no clubbing Pulses: 2+ symmetric, upper and lower extremities, normal cap refill Neurological: alert, oriented x 3, CN2-12 intact, strength normal upper extremities and lower extremities, sensation normal throughout, DTRs 2+ throughout, no cerebellar signs, gait Antalgic Psychiatric: normal affect, behavior normal, pleasant   Diabetic Foot Exam - Simple   Simple Foot Form Diabetic Foot exam was performed with the following findings:  Yes 06/09/2017  3:21 PM  Visual Inspection No deformities, no ulcerations, no other skin breakdown bilaterally:  Yes Sensation Testing Intact to touch and monofilament testing bilaterally:  Yes Pulse Check Posterior Tibialis and Dorsalis pulse intact bilaterally:  Yes Comments normal DP and PT pulses, no trophic changes or ulcerative lesions, normal sensory exam and tinea pedis     Medicare Attestation I have personally reviewed: The patient's medical and social history Their use of alcohol, tobacco or illicit drugs Their current medications and supplements The patient's functional ability including ADLs,fall risks, home safety risks, cognitive, and hearing and visual impairment Diet and physical activities Evidence for depression or mood disorders  The patient's weight, height, BMI, and visual acuity have been recorded in the chart.  I have made referrals, counseling, and provided education to the patient based on review of the above and I have provided the patient with a written personalized care plan for preventive services.     Vicie Mutters, PA-C   06/09/2017

## 2017-06-09 NOTE — Progress Notes (Signed)
Electrophysiology Office Note   Date:  06/09/2017   ID:  Kasper, Mudrick 1950/08/08, MRN [id number]  PCP:  [name], MD  Cardiologist:  Radford Pax Primary Electrophysiologist:   [name], MD    Chief Complaint  Patient presents with  . Follow-up    Persistent Afib     History of Present Illness: GAYLE COLLARD is a 67 y.o. male who presents today for electrophysiology evaluation.   He has a history of atrial fibrillation s/p DCCV 03/2015 to NSR but before discharge he went back into afib. He also has a history of HTN, dyslipidemia, DM, ASCAD cath showing 50% LM, 40% distal LAD, 70% ost to prox LCs, 40% mid LAD and 95% mid to dist RCA s/p PCI with DES x 2 and stage PCI of the LCx/OM. At last OV he was loaded with Amio and then he was set up to have DCCV which he successfully had done in July 2017. Repeat cardioversion 03/16/16. AF ablation 06/11/16.  Today, denies symptoms of palpitations, chest pain, shortness of breath, orthopnea, PND, lower extremity edema, claudication, dizziness, presyncope, syncope, bleeding, or neurologic sequela. The patient is tolerating medications without difficulties.  He is overall feeling well without major complaint.  He is noted no further episodes of atrial fibrillation.  Past Medical History:  Diagnosis Date  . Arthritis    "joints get stiff" (06/11/2016)  . Bee sting allergy   . Bee sting allergy   . CAD (coronary artery disease), native coronary artery 02/12/2016   cath showing 50% LM, 40% distal LAD, 70% ost to prox LCs, 40% mid LAD and 95% mid to dist RCA s/p PCI with DES x 2 and stage PCI of the LCx/OM.  [name] History of gout   . Hyperlipidemia   . Hypertension   . Hypothyroidism   . OSA (obstructive sleep apnea)    "tried CPAP; couldn't do it" (06/11/2016)  . Persistent atrial fibrillation ([place])    s/p DCCV 03/2015 with reversion back to atrial fibrillation, loaded with Amio and s/p DCCV to NSR 11/2015.  He is now s/p afib  albation.  He had amio lung toxicity and amio was stopped.  . Thyroid disease   . Type II diabetes mellitus ([place])   . Vitamin D deficiency    Past Surgical History:  Procedure Laterality Date  . CARDIAC CATHETERIZATION N/A 10/11/2015   Procedure: Left Heart Cath and Coronary Angiography;  Surgeon: [name], MD;  Location: [facility];  Service: Cardiovascular;  Laterality: N/A;  . CARDIAC CATHETERIZATION N/A 10/11/2015   Procedure: Coronary Stent Intervention;  Surgeon: [name], MD;  Location: [place] [facility];  Service: Cardiovascular;  Laterality: N/A;  . CARDIAC CATHETERIZATION N/A 10/11/2015   Procedure: Intravascular Pressure Wire/FFR Study;  Surgeon: [name], MD;  Location: [place] [facility];  Service: Cardiovascular;  Laterality: N/A;  . CARDIAC CATHETERIZATION N/A 10/14/2015   Procedure: Coronary Stent Intervention;  Surgeon: [name], MD;  Location: [place] [facility];  Service: Cardiovascular;  Laterality: N/A;  . CARDIOVERSION N/A 04/05/2015   Procedure: CARDIOVERSION;  Surgeon: [name], MD;  Location: [place] [facility];  Service: Cardiovascular;  Laterality: N/A;  . CARDIOVERSION N/A 12/09/2015   Procedure: CARDIOVERSION;  Surgeon: [name], MD;  [facility];  Service: Cardiovascular;  Laterality: N/A;  . CARDIOVERSION N/A 03/16/2016   Procedure: CARDIOVERSION;  Surgeon: [name], MD;  Location: [place];  Service: Cardiovascular;  Laterality: N/A;  .  CORONARY ANGIOPLASTY    . ELECTROPHYSIOLOGIC STUDY N/A 06/11/2016   Procedure: Atrial Fibrillation Ablation;  Surgeon:  [name], MD;  Location: [facility];  Service: Cardiovascular;  Laterality: N/A;  . JOINT REPLACEMENT    . TOTAL HIP ARTHROPLASTY Bilateral 2001  . UVULECTOMY       Current Outpatient Medications  Medication Sig Dispense Refill  . allopurinol (ZYLOPRIM) 300 MG tablet Take 150 mg by mouth daily.    [name] amLODipine (NORVASC) 5 MG  tablet take 1 tablet by mouth once daily 30 tablet 11  . atenolol (TENORMIN) 25 MG tablet   0  . atorvastatin (LIPITOR) 80 MG tablet take 1 tablet by mouth once daily AT 6PM 90 tablet 1  . B Complex Vitamins (B COMPLEX PO) Take 1 tablet by mouth daily.    . carvedilol (COREG) 12.5 MG tablet Take 1 tablet (12.5 mg total) by mouth 2 (two) times daily. 60 tablet 6  . cholecalciferol (VITAMIN D) 1000 UNITS tablet Take 2,000 Units by mouth daily.    . colchicine 0.6 MG tablet take 1 tablet by mouth once daily TO PREVENT GOUT 90 tablet 0  . levothyroxine (SYNTHROID, LEVOTHROID) 125 MCG tablet take 1 tablet by mouth every morning ON AN EMPTY STOMACH- WAIT 30 MINUTES TO EAT, NO ANTACIDS FOR 4 HOURS 90 tablet 1  . loratadine (CLARITIN) 10 MG tablet Take 10 mg by mouth daily.    [name] losartan (COZAAR) 100 MG tablet take 1 tablet by mouth once daily for blood pressure heart and KIDNEYS 90 tablet 1  . magnesium gluconate (MAGONATE) 500 MG tablet Take 1,000 mg by mouth 2 (two) times daily.     . metFORMIN (GLUCOPHAGE) 500 MG tablet take 1 to 2 tablets by mouth twice a day with food as directed for diabetes 360 tablet 1  . nystatin cream (MYCOSTATIN) Apply 1 application topically 2 (two) times daily as needed (wound care). 30 g 1  . XARELTO 20 MG TABS tablet take 1 tablet by mouth once daily WITH SUPPER 90 tablet 1   No current facility-administered medications for this visit.     Allergies:   Amiodarone and Bee venom   Social History:  The patient  reports that  has never smoked. he has never used smokeless tobacco. He reports that he does not drink alcohol or use drugs.   Family History:  The patient's family history includes Colon cancer in his sister; Lung cancer in his father and mother.    ROS:  Please see the history of present illness.   Otherwise, review of systems is positive for none.   All other systems are reviewed and negative.   PHYSICAL EXAM: VS:  BP (!) 142/82   Pulse 67   Ht 6' (1.829  m)   Wt 252 lb (114.3 kg)   BMI 34.18 kg/m  , BMI Body mass index is 34.18 kg/m. GEN: Well nourished, well developed, in no acute distress  HEENT: normal  Neck: no JVD, carotid bruits, or masses Cardiac: RRR; no murmurs, rubs, or gallops,no edema  Respiratory:  clear to auscultation bilaterally, normal work of breathing GI: soft, nontender, nondistended, + BS MS: no deformity or atrophy  Skin: warm and dry Neuro:  Strength and sensation are intact Psych: euthymic mood, full affect  EKG:  EKG is ordered today. Personal review of the ekg ordered shows sinus rhythm, rate 67   Recent Labs: 12/10/2016: NT-Pro BNP 102 03/03/2017: ALT 35; BUN 17; Creat 1.26; Hemoglobin  14.3; Magnesium 1.9; Platelets 251; Potassium 3.9; Sodium 140; TSH 1.42    Lipid Panel     Component Value Date/Time   CHOL 91 03/03/2017 1657   TRIG 139 03/03/2017 1657   HDL 36 (L) 03/03/2017 1657   CHOLHDL 2.5 03/03/2017 1657   VLDL 19 11/30/2016 1609   LDLCALC 42 11/30/2016 1609     Wt Readings from Last 3 Encounters:  06/09/17 252 lb (114.3 kg)  03/03/17 253 lb 9.6 oz (115 kg)  02/23/17 262 lb 3.2 oz (118.9 kg)      Other studies Reviewed: Additional studies/ records that were reviewed today include: TTE 03/2015, Cath 10/14/15  Review of the above records today demonstrates:  - Left ventricle: The cavity size was normal. Wall thickness was   increased in a pattern of mild LVH. Systolic function was normal.   The estimated ejection fraction was in the range of 60% to 65%.   Indeterminant diastolic function (atrial fibrillation). Wall   motion was normal; there were no regional wall motion   abnormalities. - Aortic valve: There was no stenosis. - Aorta: Borderline dilated aortic root. - Mitral valve: There was trivial regurgitation. - Left atrium: The atrium was mildly dilated. - Right ventricle: The cavity size was moderately dilated. Systolic   function was normal. - Right atrium: The atrium was  mildly dilated. - Tricuspid valve: Peak RV-RA gradient (S): 32 mm Hg. - Pulmonary arteries: PA peak pressure: 35 mm Hg (S). - Inferior vena cava: The vessel was normal in size. The   respirophasic diameter changes were in the normal range (>= 50%),   consistent with normal central venous pressure.   Ost 1st Mrg to 1st Mrg lesion to mid circumflex, 95% stenosed. Post intervention with a 2.75 x 32 Synergy, postdilated to 3.5 mm, there is a 0% residual stenosis.  The Synergy stent extends from the OM back into the native circumflex, jailing the mid circumflex. Postprocedure, there is TIMI-3 flow in the circumflex with only a moderate stenosis at the jailed portion.   Cath 10/11/15  LM lesion, 50% stenosed.  Dist LAD lesion, 40% stenosed.  Ost Cx to Prox Cx lesion, 70% stenosed.  1st Mrg lesion, 70% stenosed.  Mid LAD lesion, 40% stenosed.  Mid RCA to Dist RCA lesion, 95% stenosed. Post intervention, there is a 0% residual stenosis. The lesion was not previously treated.   ASSESSMENT AND PLAN:  1.  Persistent atrial fibrillation/flutter: On Xarelto.  Had ablation 06/11/16.  Remains in sinus rhythm today.  No changes.    This patients CHA2DS2-VASc Score and unadjusted Ischemic Stroke Rate (% per year) is equal to 4.8 % stroke rate/year from a score of 4  Above score calculated as 1 point each if present [CHF, HTN, DM, Vascular=MI/PAD/Aortic Plaque, Age if 65-74, or Male] Above score calculated as 2 points each if present [Age > 75, or Stroke/TIA/TE]  2. Hypertension: Mildly elevated today but has been normal in the past.  Does have a physical later today with his primary physician.  We  make no changes.  3. Hyperlipidemia: Continue statin  4. CAD: Status post circumflex and RCA PCI.  No current chest pain.  5. OSA: CPAP compliance encouraged  Current medicines are reviewed at length with the patient today.   The patient does not have concerns regarding his medicines.  The  following changes were made today:  none  Labs/ tests ordered today include:  Orders Placed This Encounter  Procedures  . EKG 12-Lead  Disposition:   FU with   12 months  Signed,  [name], MD  06/09/2017 11:50 AM     [facility] [address] [place] [address] [phone] (office) [phone] (fax)

## 2017-06-09 NOTE — Patient Instructions (Signed)
Cologuard is an easy to use noninvasive colon cancer screening test based on the latest advances in stool DNA science.   Colon cancer is 3rd most diagnosed cancer and 2nd leading cause of death in both men and women 68 years of age and older despite being one of the most preventable and treatable cancers if found early.  4 of out 5 people diagnosed with colon cancer have NO prior family history.  When caught EARLY 90% of colon cancer is curable.   You have agreed to do a Cologuard screening and have declined a colonoscopy in spite of being explained the risks and benefits of the colonoscopy in detail, including cancer and death. Please understand that this is test not as sensitive or specific as a colonoscopy and you are still recommended to get a colonoscopy.   If you are NOT medicare please call your insurance company and give them these items to see if they will cover it: 1) CPT code, [phone] 2) Provider is Probation officer 3) [employer] NPI [id number] 4) [place] Tax ID [phone]  Out-of-pocket cost for Cologuard can range from $0 - $649 so please call  You will receive a short call from [place] support center at [employer], when you receive a call they will say they are from [place],  to confirm your mailing address and give you more information.  When they calll you, it will appear on the caller ID as "[employer]" or in some cases only this number will appear, [phone].   [employer] will ship your collection kit directly to you. You will collect a single stool sample in the privacy of your own home, no special preparation required. You will return the kit via [place] pre-paid shipping or pick-up, in the same box it arrived in. Then I will contact you to discuss your results after I receive them from the laboratory.   If you have any questions or concerns, Cologuard Customer Support Specialist are available 24 hours a  day, 7 days a week at [phone] or go to [email or web address].       Your A1C is a measure of your sugar over the past 3 months and is not affected by what you have eaten over the past few days. Diabetes increases your chances of stroke and heart attack over 300 % and is the leading cause of blindness and kidney failure in the [place]. Please make sure you decrease bad carbs like white bread, white rice, potatoes, corn, soft drinks, pasta, cereals, refined sugars, sweet tea, dried fruits, and fruit juice. Good carbs are okay to eat in moderation like sweet potatoes, brown rice, whole grain pasta/bread, most fruit (except dried fruit) and you can eat as many veggies as you want.   Greater than 6.5 is considered diabetic. Between 6.4 and 5.7 is prediabetic If your A1C is less than 5.7 you are NOT diabetic.  Targets for Glucose Readings: Time of Check Target for patients WITHOUT Diabetes Target for DIABETICS  Before Meals Less than 100  less than 150  Two hours after meals Less than 200  Less than 250      Bad carbs also include fruit juice, alcohol, and sweet tea. These are empty calories that do not signal to your brain that you are full.   Please remember the good carbs are still carbs which convert into sugar. So please measure them out no more than 1/2-1 cup of rice, oatmeal, pasta, and beans  Veggies are however free foods! Pile them on.   Not all fruit is created equal. Please see the list below, the fruit at the bottom is higher in sugars than the fruit at the top. Please avoid all dried fruits.

## 2017-06-10 LAB — LIPID PANEL
Cholesterol: 101 mg/dL (ref <200)
HDL: 37 mg/dL — ABNORMAL LOW (ref >40)
LDL CHOLESTEROL (CALC): 42 mg/dL
NON-HDL CHOLESTEROL (CALC): 64 mg/dL (ref <130)
TRIGLYCERIDES: 134 mg/dL (ref <150)
Total CHOL/HDL Ratio: 2.7 (calc) (ref <5.0)

## 2017-06-10 LAB — BASIC METABOLIC PANEL WITH GFR
BUN: 16 mg/dL (ref 7–25)
CALCIUM: 10.1 mg/dL (ref 8.6–10.3)
CO2: 28 mmol/L (ref 20–32)
Chloride: 104 mmol/L (ref 98–110)
Creat: 1.14 mg/dL (ref 0.70–1.25)
GFR, EST AFRICAN AMERICAN: 77 mL/min/{1.73_m2} (ref >=60)
GFR, EST NON AFRICAN AMERICAN: 66 mL/min/{1.73_m2} (ref >=60)
Glucose, Bld: 121 mg/dL — ABNORMAL HIGH (ref 65–99)
POTASSIUM: 4.4 mmol/L (ref 3.5–5.3)
Sodium: 142 mmol/L (ref 135–146)

## 2017-06-10 LAB — CBC WITH DIFFERENTIAL/PLATELET
BASOS PCT: 0.3 %
Basophils Absolute: 34 cells/uL (ref 0–200)
EOS PCT: 1 %
Eosinophils Absolute: 112 cells/uL (ref 15–500)
HCT: 44 % (ref 38.5–50.0)
HEMOGLOBIN: 14.8 g/dL (ref 13.2–17.1)
Lymphs Abs: 1669 cells/uL (ref 850–3900)
MCH: 31.2 pg (ref 27.0–33.0)
MCHC: 33.6 g/dL (ref 32.0–36.0)
MCV: 92.8 fL (ref 80.0–100.0)
MONOS PCT: 11.2 %
MPV: 10.7 fL (ref 7.5–12.5)
NEUTROS ABS: 8131 {cells}/uL — AB (ref 1500–7800)
Neutrophils Relative %: 72.6 %
PLATELETS: 268 10*3/uL (ref 140–400)
RBC: 4.74 10*6/uL (ref 4.20–5.80)
RDW: 14.2 % (ref 11.0–15.0)
TOTAL LYMPHOCYTE: 14.9 %
WBC mixed population: 1254 cells/uL — ABNORMAL HIGH (ref 200–950)
WBC: 11.2 10*3/uL — AB (ref 3.8–10.8)

## 2017-06-10 LAB — HEPATIC FUNCTION PANEL
AG RATIO: 1.5 (calc) (ref 1.0–2.5)
ALBUMIN MSPROF: 4.4 g/dL (ref 3.6–5.1)
ALT: 24 U/L (ref 9–46)
AST: 24 U/L (ref 10–35)
Alkaline phosphatase (APISO): 100 U/L (ref 40–115)
BILIRUBIN DIRECT: 0.2 mg/dL (ref 0.0–0.2)
BILIRUBIN TOTAL: 0.8 mg/dL (ref 0.2–1.2)
GLOBULIN: 3 g/dL (ref 1.9–3.7)
Indirect Bilirubin: 0.6 mg/dL (calc) (ref 0.2–1.2)
Total Protein: 7.4 g/dL (ref 6.1–8.1)

## 2017-06-10 LAB — URINALYSIS, ROUTINE W REFLEX MICROSCOPIC
Bilirubin Urine: NEGATIVE
Glucose, UA: NEGATIVE
Hgb urine dipstick: NEGATIVE
Ketones, ur: NEGATIVE
LEUKOCYTES UA: NEGATIVE
Nitrite: NEGATIVE
PH: 7 (ref 5.0–8.0)
Protein, ur: NEGATIVE
SPECIFIC GRAVITY, URINE: 1.01 (ref 1.001–1.03)

## 2017-06-10 LAB — HEMOGLOBIN A1C
HEMOGLOBIN A1C: 7 %{Hb} — AB (ref <5.7)
MEAN PLASMA GLUCOSE: 154 (calc)
eAG (mmol/L): 8.5 (calc)

## 2017-06-10 LAB — HEPATITIS C ANTIBODY
Hepatitis C Ab: NONREACTIVE
SIGNAL TO CUT-OFF: 0.03 (ref <1.00)

## 2017-06-10 LAB — MICROALBUMIN / CREATININE URINE RATIO
Creatinine, Urine: 78 mg/dL (ref 20–320)
MICROALB UR: 4.6 mg/dL
MICROALB/CREAT RATIO: 59 ug/mg{creat} — AB (ref <30)

## 2017-06-10 LAB — TSH: TSH: 0.85 m[IU]/L (ref 0.40–4.50)

## 2017-06-10 LAB — URIC ACID: Uric Acid, Serum: 5.8 mg/dL (ref 4.0–8.0)

## 2017-06-10 LAB — MAGNESIUM: Magnesium: 2 mg/dL (ref 1.5–2.5)

## 2017-06-11 ENCOUNTER — Other Ambulatory Visit: Payer: Self-pay | Admitting: Internal Medicine

## 2017-06-29 ENCOUNTER — Other Ambulatory Visit: Payer: Self-pay | Admitting: Physician Assistant

## 2017-06-29 ENCOUNTER — Other Ambulatory Visit: Payer: Self-pay

## 2017-06-29 ENCOUNTER — Telehealth: Payer: Self-pay | Admitting: Physician Assistant

## 2017-06-29 MED ORDER — AMLODIPINE BESYLATE 10 MG PO TABS: 10.0000 mg | ORAL_TABLET | Freq: Every day | ORAL | 3 refills | Status: DC

## 2017-06-29 MED ORDER — AMLODIPINE BESYLATE 5 MG PO TABS: ORAL_TABLET | ORAL | 11 refills | Status: DC

## 2017-06-29 MED ORDER — OLMESARTAN MEDOXOMIL 40 MG PO TABS: 40.0000 mg | ORAL_TABLET | Freq: Every day | ORAL | 3 refills | Status: DC

## 2017-06-29 NOTE — Telephone Encounter (Signed)
Patient wants refill of norvasc, medication was "doubled" last time which I thought was 10 mg but patient is now asking for 1.5 pills or 15 mg. This is too much medication. Will send in Norvasc 10 mg, switch/stop losartan and start on benicar 40mg  and continue other medications the same. Monitor BP at home.

## 2017-06-29 NOTE — Telephone Encounter (Signed)
Pharmacy states that patient is actually taking the amlodipine 5mg , 1 and 1/2 tablets daily (7.5mg ). States that the 10mg  made his BP drop too much.

## 2017-07-05 ENCOUNTER — Other Ambulatory Visit: Payer: Self-pay | Admitting: Cardiology

## 2017-07-05 MED ORDER — CARVEDILOL 12.5 MG PO TABS: 12.5000 mg | ORAL_TABLET | Freq: Two times a day (BID) | ORAL | 3 refills | Status: DC

## 2017-07-16 ENCOUNTER — Other Ambulatory Visit: Payer: Self-pay | Admitting: Internal Medicine

## 2017-07-16 IMAGING — NM NM MISC PROCEDURE
6 series · 36 of 36 positions shown · non-contrast
Comparison: none

[Series 1: rest · 6.40mm/px · 6 of 64 frames shown]
[frame 6/64]
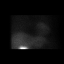
[frame 16/64]
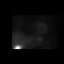
[frame 27/64]
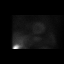
[frame 38/64]
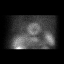
[frame 48/64]
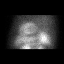
[frame 59/64]
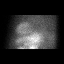

[Series 1: stress-gsp · 6.40mm/px · 6 of 464 frames shown]
[frame 39/464  full-range]
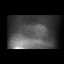
[frame 116/464  full-range]
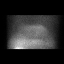
[frame 194/464  full-range]
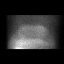
[frame 271/464  full-range]
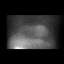
[frame 348/464  full-range]
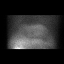
[frame 426/464  full-range]
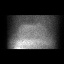

[Series 1: wbr_s-proj_st stress-sum-em · 6.40mm/px · 6 of 64 frames shown]
[frame 6/64]
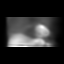
[frame 16/64]
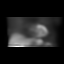
[frame 27/64]
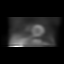
[frame 38/64]
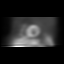
[frame 48/64]
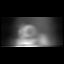
[frame 59/64]
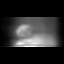

[Series 1: wbr_r-proj_st rest · 6.40mm/px · 6 of 64 frames shown]
[frame 6/64]
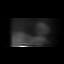
[frame 16/64]
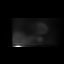
[frame 27/64]
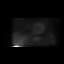
[frame 38/64]
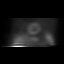
[frame 48/64]
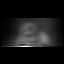
[frame 59/64]
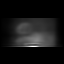

[Series 1: wbr_s-proj_st stress-gsp · 6.40mm/px · 6 of 512 frames shown]
[frame 43/512]
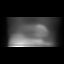
[frame 128/512]
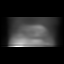
[frame 214/512]
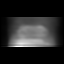
[frame 299/512]
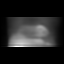
[frame 384/512]
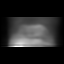
[frame 470/512]
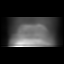

[Series 1: stress-sum-em · 6.40mm/px · 6 of 64 frames shown]
[frame 6/64]
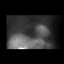
[frame 16/64]
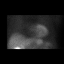
[frame 27/64]
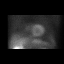
[frame 38/64]
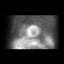
[frame 48/64]
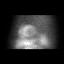
[frame 59/64]
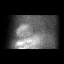

[36 of 36 positions shown; findings below may reference images not displayed]

Canned report from images found in remote index.

Refer to host system for actual result text.

## 2017-07-20 DIAGNOSIS — Z1211 Encounter for screening for malignant neoplasm of colon: Secondary | ICD-10-CM | POA: Diagnosis not present

## 2017-07-20 DIAGNOSIS — Z1212 Encounter for screening for malignant neoplasm of rectum: Secondary | ICD-10-CM | POA: Diagnosis not present

## 2017-07-26 LAB — COLOGUARD: COLOGUARD: POSITIVE

## 2017-07-27 ENCOUNTER — Other Ambulatory Visit: Payer: Self-pay

## 2017-07-27 MED ORDER — OLMESARTAN MEDOXOMIL 40 MG PO TABS: 40.0000 mg | ORAL_TABLET | Freq: Every day | ORAL | 3 refills | Status: DC

## 2017-07-27 MED ORDER — COLCHICINE 0.6 MG PO TABS: ORAL_TABLET | ORAL | 0 refills | Status: DC

## 2017-07-27 MED ORDER — RIVAROXABAN 20 MG PO TABS: ORAL_TABLET | ORAL | 1 refills | Status: DC

## 2017-07-29 ENCOUNTER — Other Ambulatory Visit: Payer: Self-pay | Admitting: Cardiology

## 2017-07-29 ENCOUNTER — Other Ambulatory Visit: Payer: Self-pay | Admitting: *Deleted

## 2017-07-29 DIAGNOSIS — E039 Hypothyroidism, unspecified: Secondary | ICD-10-CM

## 2017-07-29 MED ORDER — LEVOTHYROXINE SODIUM 125 MCG PO TABS: ORAL_TABLET | ORAL | 1 refills | Status: DC

## 2017-07-29 MED ORDER — AMLODIPINE BESYLATE 5 MG PO TABS: ORAL_TABLET | ORAL | 3 refills | Status: DC

## 2017-07-29 MED ORDER — ALLOPURINOL 300 MG PO TABS: 300.0000 mg | ORAL_TABLET | Freq: Every day | ORAL | 1 refills | Status: DC

## 2017-07-30 ENCOUNTER — Encounter: Payer: Self-pay | Admitting: Internal Medicine

## 2017-07-30 ENCOUNTER — Other Ambulatory Visit: Payer: Self-pay | Admitting: Physician Assistant

## 2017-07-30 ENCOUNTER — Encounter: Payer: Self-pay | Admitting: Physician Assistant

## 2017-07-30 ENCOUNTER — Telehealth: Payer: Self-pay | Admitting: Physician Assistant

## 2017-07-30 DIAGNOSIS — R195 Other fecal abnormalities: Secondary | ICD-10-CM

## 2017-07-30 NOTE — Telephone Encounter (Signed)
Spoke with patient and advised Cologuard POSITIVE. Referring patient to [facility] for evalution, possible colonoscopy. patient acknowledge understanding of the results. Mailed copy of results to patient. Scanned into Epic/Labs

## 2017-08-02 ENCOUNTER — Other Ambulatory Visit: Payer: Self-pay | Admitting: Internal Medicine

## 2017-08-02 MED ORDER — METFORMIN HCL ER 500 MG PO TB24: ORAL_TABLET | ORAL | 1 refills | Status: DC

## 2017-08-31 ENCOUNTER — Other Ambulatory Visit: Payer: Self-pay | Admitting: Physician Assistant

## 2017-08-31 MED ORDER — TELMISARTAN 80 MG PO TABS: 80.0000 mg | ORAL_TABLET | Freq: Every day | ORAL | 1 refills | Status: DC

## 2017-09-03 ENCOUNTER — Encounter: Payer: Self-pay | Admitting: Cardiology

## 2017-09-09 ENCOUNTER — Ambulatory Visit (INDEPENDENT_AMBULATORY_CARE_PROVIDER_SITE_OTHER): Payer: Medicare Other | Admitting: Cardiology

## 2017-09-09 ENCOUNTER — Encounter: Payer: Self-pay | Admitting: Cardiology

## 2017-09-09 VITALS — BP 144/78 | HR 77 | Ht 72.0 in | Wt 243.0 lb

## 2017-09-09 DIAGNOSIS — I481 Persistent atrial fibrillation: Secondary | ICD-10-CM

## 2017-09-09 DIAGNOSIS — R0989 Other specified symptoms and signs involving the circulatory and respiratory systems: Secondary | ICD-10-CM

## 2017-09-09 DIAGNOSIS — I2510 Atherosclerotic heart disease of native coronary artery without angina pectoris: Secondary | ICD-10-CM

## 2017-09-09 DIAGNOSIS — I10 Essential (primary) hypertension: Secondary | ICD-10-CM | POA: Diagnosis not present

## 2017-09-09 DIAGNOSIS — E7800 Pure hypercholesterolemia, unspecified: Secondary | ICD-10-CM | POA: Diagnosis not present

## 2017-09-09 DIAGNOSIS — I4819 Other persistent atrial fibrillation: Secondary | ICD-10-CM

## 2017-09-09 NOTE — Progress Notes (Signed)
Cardiology Office Note:    Date:  09/09/2017   ID:  Christopher Wade, DOB Aug 18, 1950, MRN [id number]  PCP:  [name], MD  Cardiologist:  No primary care provider on file.    Referring MD: [name], MD   Chief Complaint  Patient presents with  . Coronary Artery Disease  . Atrial Fibrillation  . Hypertension  . Hyperlipidemia    History of Present Illness:    Christopher Wade is a 67 y.o. male with a hx of  persistent atrial fibrillation s/p DCCV 03/2015 to NSR but before discharge he went back into afib.He was loaded with Amio and then he was set up to have DCCV which he successfully had done in July 2017. He ultimately underwent afib ablation 06/11/2016. He was found to have early lung toxicity and his Amio was stopped. He also has a history of HTN, dyslipidemia, DM, ASCAD cath showing 50% LM, 40% distal LAD, 70% ost to prox LCX, 40% mid LAD and 95% mid to dist RCA s/p PCI with DES x 2 and stage PCI of the LCx/OM.    He is here today for followup and is doing well.  He denies any chest pain or pressure, SOB, DOE, PND, orthopnea, LE edema, dizziness, palpitations or syncope. He is compliant with his meds and is tolerating meds with no SE.    Past Medical History:  Diagnosis Date  . Arthritis    "joints get stiff" (06/11/2016)  . Bee sting allergy   . Bee sting allergy   . CAD (coronary artery disease), native coronary artery 02/12/2016   cath showing 50% LM, 40% distal LAD, 70% ost to prox LCs, 40% mid LAD and 95% mid to dist RCA s/p PCI with DES x 2 and stage PCI of the LCx/OM.  [name] History of gout   . Hyperlipidemia   . Hypertension   . Hypothyroidism   . OSA (obstructive sleep apnea)    "tried CPAP; couldn't do it" (06/11/2016)  . Persistent atrial fibrillation ([place])    s/p DCCV 03/2015 with reversion back to atrial fibrillation, loaded with Amio and s/p DCCV to NSR 11/2015.  He is now s/p afib albation.  He had amio lung toxicity and amio was stopped.  . Thyroid  disease   . Type II diabetes mellitus ([place])   . Vitamin D deficiency     Past Surgical History:  Procedure Laterality Date  . CARDIAC CATHETERIZATION N/A 10/11/2015   Procedure: Left Heart Cath and Coronary Angiography;  Surgeon: [name], MD;  Location: [facility];  Service: Cardiovascular;  Laterality: N/A;  . CARDIAC CATHETERIZATION N/A 10/11/2015   Procedure: Coronary Stent Intervention;  Surgeon: [name], MD;  Location: [facility];  Service: Cardiovascular;  Laterality: N/A;  . CARDIAC CATHETERIZATION N/A 10/11/2015   Procedure: Intravascular Pressure Wire/FFR Study;  Surgeon: [name], MD;  Location: [facility];  Service: Cardiovascular;  Laterality: N/A;  . CARDIAC CATHETERIZATION N/A 10/14/2015   Procedure: Coronary Stent Intervention;  Surgeon: [name], MD;  Location: [facility];  Service: Cardiovascular;  Laterality: N/A;  . CARDIOVERSION N/A 04/05/2015   Procedure: CARDIOVERSION;  Surgeon: [name], MD;  Location: [facility];  Service: Cardiovascular;  Laterality: N/A;  . CARDIOVERSION N/A 12/09/2015   Procedure: CARDIOVERSION;  Surgeon: [name], MD;  Location: [facility];  Service: Cardiovascular;  Laterality: N/A;  . CARDIOVERSION N/A 03/16/2016   Procedure: CARDIOVERSION;  Surgeon:  [name], MD;  Location: [facility];  Service: Cardiovascular;  Laterality: N/A;  . CORONARY ANGIOPLASTY    . ELECTROPHYSIOLOGIC STUDY N/A 06/11/2016   Procedure: Atrial Fibrillation Ablation;  Surgeon: Will [name], MD;  Location: [facility];  Service: Cardiovascular;  Laterality: N/A;  . JOINT REPLACEMENT    . TOTAL HIP ARTHROPLASTY Bilateral 2001  . UVULECTOMY      Current Medications: Current Meds  Medication Sig  . allopurinol (ZYLOPRIM) 300 MG tablet Take 150 mg by mouth daily.  [name] amLODipine (NORVASC) 5 MG tablet Take 1.5 tablets by mouth daily  . atorvastatin (LIPITOR) 80 MG tablet take  1 tablet by mouth once daily AT 6PM  . B Complex Vitamins (B COMPLEX PO) Take 1 tablet by mouth daily.  . carvedilol (COREG) 12.5 MG tablet Take 1 tablet (12.5 mg total) by mouth 2 (two) times daily.  . cholecalciferol (VITAMIN D) 1000 UNITS tablet Take 2,000 Units by mouth daily.  . colchicine 0.6 MG tablet TAKE 1 TABLET BY MOUTH 1  TIME DAILY TO PREVENT GOUT  . levothyroxine (SYNTHROID, LEVOTHROID) 125 MCG tablet take 1 tablet by mouth every morning ON AN EMPTY STOMACH- WAIT 30 MINUTES TO EAT, NO ANTACIDS FOR 4 HOURS  . loratadine (CLARITIN) 10 MG tablet Take 10 mg by mouth daily.  [name] losartan (COZAAR) 100 MG tablet Take 100 mg by mouth daily.  . magnesium gluconate (MAGONATE) 500 MG tablet Take 1,000 mg by mouth 2 (two) times daily.   . metFORMIN (GLUCOPHAGE) 500 MG tablet Take 500 mg by mouth. Take 2 tablets in the a.m. and 1 tablet in the p.m by mouth  . nystatin cream (MYCOSTATIN) Apply 1 application topically 2 (two) times daily as needed (wound care).  . rivaroxaban (XARELTO) 20 MG TABS tablet take 1 tablet by mouth once daily WITH SUPPER  . telmisartan (MICARDIS) 80 MG tablet Take 1 tablet (80 mg total) by mouth daily.  . [DISCONTINUED] allopurinol (ZYLOPRIM) 300 MG tablet Take 1 tablet (300 mg total) by mouth daily. (Patient taking differently: Take 150 mg by mouth daily. )     Allergies:   Amiodarone and Bee venom   Social History   Socioeconomic History  . Marital status: Single    Spouse name: Not on file  . Number of children: Not on file  . Years of education: Not on file  . Highest education level: Not on file  Occupational History  . Not on file  Social Needs  . Financial resource strain: Not on file  . Food insecurity:    Worry: Not on file    Inability: Not on file  . Transportation needs:    Medical: Not on file    Non-medical: Not on file  Tobacco Use  . Smoking status: Never Smoker  . Smokeless tobacco: Never Used  Substance and Sexual Activity  . Alcohol  use: No  . Drug use: No  . Sexual activity: Not Currently  Lifestyle  . Physical activity:    Days per week: Not on file    Minutes per session: Not on file  . Stress: Not on file  Relationships  . Social connections:    Talks on phone: Not on file    Gets together: Not on file    Attends religious service: Not on file    Active member of club or organization: Not on file    Attends meetings of clubs or organizations: Not on file    Relationship status:  Not on file  Other Topics Concern  . Not on file  Social History Narrative   Architect      Family History: The patient's family history includes Colon cancer in his sister; Lung cancer in his father and mother.  ROS:   Please see the history of present illness.    ROS  All other systems reviewed and negative.   EKGs/Labs/Other Studies Reviewed:    The following studies were reviewed today: none  EKG:  EKG is  ordered today.    Recent Labs: 12/10/2016: NT-Pro BNP 102 06/09/2017: ALT 24; BUN 16; Creat 1.14; Hemoglobin 14.8; Magnesium 2.0; Platelets 268; Potassium 4.4; Sodium 142; TSH 0.85   Recent Lipid Panel    Component Value Date/Time   CHOL 101 06/09/2017 1540   TRIG 134 06/09/2017 1540   HDL 37 (L) 06/09/2017 1540   CHOLHDL 2.7 06/09/2017 1540   VLDL 19 11/30/2016 1609   LDLCALC 42 06/09/2017 1540    Physical Exam:    VS:  BP (!) 144/78   Pulse 77   Ht 6' (1.829 m)   Wt 243 lb (110.2 kg)   SpO2 93%   BMI 32.96 kg/m     Wt Readings from Last 3 Encounters:  09/09/17 243 lb (110.2 kg)  06/09/17 252 lb 3.2 oz (114.4 kg)  06/09/17 252 lb (114.3 kg)     GEN:  Well nourished, well developed in no acute distress HEENT: Normal NECK: No JVD; No carotid bruits LYMPHATICS: No lymphadenopathy CARDIAC: RRR, no murmurs, rubs, gallops RESPIRATORY:  Clear to auscultation without rales, wheezing or rhonchi  ABDOMEN: Soft, non-tender, non-distended MUSCULOSKELETAL:  No edema; No deformity  SKIN: Warm and  dry NEUROLOGIC:  Alert and oriented x 3 PSYCHIATRIC:  Normal affect   ASSESSMENT:    1. Persistent atrial fibrillation ([place])   2. Coronary artery disease involving native coronary artery of native heart without angina pectoris   3. Essential hypertension   4. Pure hypercholesterolemia    PLAN:    In order of problems listed above:  1.  Persistent atrial fibrillation -he is maintaining normal sinus rhythm on exam today.  He will continue on Xarelto 20 mg daily and carvedilol 12.5 mg twice daily.  Creatinine was stable at 1.14 on 06/09/2017.  The same blood draw showed a normal hemoglobin of 14.8.  Creatinine clearance is 98.03 mL/min.  2.  ASCAD - s/p cath 2017 showing 50% LM, 40% distal LAD, 70% ost to prox LCX, 40% mid LAD and 95% mid to dist RCA s/p PCI with DES x 2 and stage PCI of the LCx/OM.    He denies any anginal symptoms.  He is tolerating his medicines with no problems.  He will continue on atorvastatin and carvedilol.  He is not on aspirin therapy due to Xarelto.  3.  HTN - blood pressure is fairly well controlled on exam today.  He will continue on amlodipine 5 mg daily, carvedilol 12.5 mg twice daily, Micardis 80 mg daily.  4.  Hyperlipidemia with LDL goal < 70.  LDL was at goal at 42 on 06/10/2017.  He will continue on atorvastatin 80 mg daily.   Medication Adjustments/Labs and Tests Ordered: Current medicines are reviewed at length with the patient today.  Concerns regarding medicines are outlined above.  No orders of the defined types were placed in this encounter.  No orders of the defined types were placed in this encounter.   Signed, Fransico Him, MD  09/09/2017 1:24 PM  [facility]

## 2017-09-09 NOTE — Patient Instructions (Addendum)
Medication Instructions:  Your physician recommends that you continue on your current medications as directed. Please refer to the Current Medication list given to you today.  If you need a refill on your cardiac medications, please contact your pharmacy first.  Labwork: None ordered   Testing/Procedures: Your physician has requested that you have a carotid duplex. This test is an ultrasound of the carotid arteries in your neck. It looks at blood flow through these arteries that supply the brain with blood. Allow one hour for this exam. There are no restrictions or special instructions.  Follow-Up: Your physician wants you to follow-up in: 6 months with [name]. You will receive a reminder letter in the mail two months in advance. If you don't receive a letter, please call our office to schedule the follow-up appointment.  Any Other Special Instructions Will Be Listed Below (If Applicable).   Thank you for choosing [facility], RN  [phone]  If you need a refill on your cardiac medications before your next appointment, please call your pharmacy.

## 2017-09-10 ENCOUNTER — Other Ambulatory Visit: Payer: Self-pay | Admitting: Cardiology

## 2017-09-10 DIAGNOSIS — I6523 Occlusion and stenosis of bilateral carotid arteries: Secondary | ICD-10-CM

## 2017-09-10 DIAGNOSIS — R0989 Other specified symptoms and signs involving the circulatory and respiratory systems: Secondary | ICD-10-CM

## 2017-09-14 NOTE — Progress Notes (Signed)
Assessment and Plan:  Hypertension -Continue medication, monitor blood pressure at home. Continue DASH diet.  Reminder to go to the ER if any CP, SOB, nausea, dizziness, severe HA, changes vision/speech, left arm numbness and tingling and jaw pain. - CALL Monday/TUESDAY WITH BP MAY HAVE TO SWITCH MICARDIS TO BENICAR   Cholesterol -Continue diet and exercise. Check cholesterol.    Diabetes with diabetic chronic kidney disease -Continue diet and exercise. Check A1C Recheck BMP/GFR   Vitamin D Def - check level and continue medications.    Hypothyroidism -check TSH level, continue medications the same, reminded to take on an empty stomach 30-64mins before food.    Obesity with co morbidities - long discussion about weight loss, diet, and exercise   Afib rate controlled Continue following up with cardio, still unable to tolerate CPAP due to dryness.   Continue diet and meds as discussed. Further disposition pending results of labs. Discussed med's effects and SE's.    Future Appointments  Date Time Provider [place]  09/28/2017 10:30 AM Gatha Mayer, MD [facility]  06/23/2018  3:00 PM Vicie Mutters, PA-C [facility] None     HPI 67 y.o. male  presents for 3 month follow up  has a past medical history of Arthritis, Bee sting allergy, Bee sting allergy, CAD (coronary artery disease), native coronary artery (02/12/2016), History of gout, Hyperlipidemia, Hypertension, Hypothyroidism, OSA (obstructive sleep apnea), Persistent atrial fibrillation ([place]), Thyroid disease, Type II diabetes mellitus ([place]), and Vitamin D deficiency.  His blood pressure has been controlled at home, HE HAS [place] has been on micardis x 2 days, today his BP is BP: (!) 160/80.  He does workout, he is very active at work but with Afib, has had days with some dyspnea/fatigue with exertion especially with the heat, follows with cardio and on xarelto.   He is on cholesterol medication,  fenofibrate stopped due to myalgias and elevated CPK. His cholesterol is not at goal. The cholesterol was:   Lab Results  Component Value Date   CHOL 101 06/09/2017   HDL 37 (L) 06/09/2017   LDLCALC 42 06/09/2017   TRIG 134 06/09/2017   CHOLHDL 2.7 06/09/2017   Lab Results  Component Value Date   CKTOTAL 135 02/02/2017   His last kidney function was worse, will recheck  Lab Results  Component Value Date   GFRNONAA 66 06/09/2017   He was started on allopurinol for elevated uric acid.   He has been working on diet and exercise for diabetes with diabetic chronic kidney disease, he is on bASA, he is on ACE/ARB, and denies  paresthesia of the feet and polydipsia. Last A1C was:  Lab Results  Component Value Date   HGBA1C 7.0 (H) 06/09/2017  Patient is on Vitamin D supplement. Lab Results  Component Value Date   VD25OH 70 08/24/2016  He is on thyroid medication. His medication was not changed last visit.   Lab Results  Component Value Date   TSH 0.85 06/09/2017  BMI is Body mass index is 33.63 kg/m., he is working on diet and exercise. Wt Readings from Last 3 Encounters:  09/16/17 248 lb (112.5 kg)  09/09/17 243 lb (110.2 kg)  06/09/17 252 lb 3.2 oz (114.4 kg)    Current Medications:  Current Outpatient Medications on File Prior to Visit  Medication Sig Dispense Refill  . allopurinol (ZYLOPRIM) 300 MG tablet Take 150 mg by mouth daily.    [name] amLODipine (NORVASC) 5 MG tablet Take 1.5  tablets by mouth daily 135 tablet 3  . atorvastatin (LIPITOR) 80 MG tablet take 1 tablet by mouth once daily AT 6PM 90 tablet 1  . B Complex Vitamins (B COMPLEX PO) Take 1 tablet by mouth daily.    . cholecalciferol (VITAMIN D) 1000 UNITS tablet Take 2,000 Units by mouth daily.    . colchicine 0.6 MG tablet TAKE 1 TABLET BY MOUTH 1  TIME DAILY TO PREVENT GOUT 90 tablet 0  . loratadine (CLARITIN) 10 MG tablet Take 10 mg by mouth daily.    . magnesium gluconate (MAGONATE) 500 MG tablet Take 1,000 mg  by mouth 2 (two) times daily.     . metFORMIN (GLUCOPHAGE) 500 MG tablet Take 500 mg by mouth. Take 2 tablets in the a.m. and 1 tablet in the p.m by mouth    . nystatin cream (MYCOSTATIN) Apply 1 application topically 2 (two) times daily as needed (wound care). 30 g 1  . rivaroxaban (XARELTO) 20 MG TABS tablet take 1 tablet by mouth once daily WITH SUPPER 90 tablet 1  . telmisartan (MICARDIS) 80 MG tablet Take 1 tablet (80 mg total) by mouth daily. 90 tablet 1   No current facility-administered medications on file prior to visit.    Medical History:  Past Medical History:  Diagnosis Date  . Arthritis    "joints get stiff" (06/11/2016)  . Bee sting allergy   . Bee sting allergy   . CAD (coronary artery disease), native coronary artery 02/12/2016   cath showing 50% LM, 40% distal LAD, 70% ost to prox LCs, 40% mid LAD and 95% mid to dist RCA s/p PCI with DES x 2 and stage PCI of the LCx/OM.  [name] History of gout   . Hyperlipidemia   . Hypertension   . Hypothyroidism   . OSA (obstructive sleep apnea)    "tried CPAP; couldn't do it" (06/11/2016)  . Persistent atrial fibrillation ([place])    s/p DCCV 03/2015 with reversion back to atrial fibrillation, loaded with Amio and s/p DCCV to NSR 11/2015.  He is now s/p afib albation.  He had amio lung toxicity and amio was stopped.  . Thyroid disease   . Type II diabetes mellitus ([place])   . Vitamin D deficiency    Allergies:  Allergies  Allergen Reactions  . Amiodarone Other (See Comments)    Lung toxicity  . Bee Venom Other (See Comments)    Leg went numb     Review of Systems:  Review of Systems  Constitutional: Negative.   HENT: Negative.   Eyes: Negative.   Respiratory: Negative for cough, hemoptysis, sputum production, shortness of breath and wheezing.   Cardiovascular: Positive for leg swelling. Negative for chest pain, palpitations, orthopnea, claudication and PND.  Gastrointestinal: Negative.   Genitourinary: Negative.    Musculoskeletal: Positive for back pain, joint pain and myalgias. Negative for falls and neck pain.  Skin: Negative.   Neurological: Negative.   Endo/Heme/Allergies: Negative.   Psychiatric/Behavioral: Negative.     Family history- Review and unchanged Social history- Review and unchanged Physical Exam: BP (!) 160/80   Pulse 77   Temp (!) 97.5 F (36.4 C)   Resp 16   Ht 6' (1.829 m)   Wt 248 lb (112.5 kg)   SpO2 96%   BMI 33.63 kg/m  Wt Readings from Last 3 Encounters:  09/16/17 248 lb (112.5 kg)  09/09/17 243 lb (110.2 kg)  06/09/17 252 lb 3.2 oz (114.4 kg)   General Appearance:  Well nourished, in no apparent distress. Eyes: PERRLA, EOMs, conjunctiva no swelling or erythema Sinuses: No Frontal/maxillary tenderness ENT/Mouth: Ext aud canals clear, TMs without erythema, bulging. No erythema, swelling, or exudate on post pharynx.  Tonsils not swollen or erythematous. Hearing normal.  Neck: Supple, thyroid normal.  Respiratory: Respiratory effort normal, BS equal bilaterally without rales, rhonchi, wheezing or stridor.  Cardio: Irreg irreg with no MRGs. Brisk peripheral pulses with 2+ edema.  Abdomen: Soft, + BS, obese, Non tender, no guarding, rebound, hernias, masses. Lymphatics: Non tender without lymphadenopathy.  Musculoskeletal: Full ROM, 5/5 strength, Normal gait Skin: Right ear with scaly area. Warm, dry without rashes, lesions, ecchymosis.  Neuro: Cranial nerves intact. No cerebellar symptoms.  Psych: Awake and oriented X 3, normal affect, Insight and Judgment appropriate.    Vicie Mutters, PA-C 4:56 PM [facility]

## 2017-09-16 ENCOUNTER — Ambulatory Visit (INDEPENDENT_AMBULATORY_CARE_PROVIDER_SITE_OTHER): Payer: Medicare Other | Admitting: Physician Assistant

## 2017-09-16 ENCOUNTER — Encounter: Payer: Self-pay | Admitting: Physician Assistant

## 2017-09-16 ENCOUNTER — Ambulatory Visit (HOSPITAL_COMMUNITY)
Admission: RE | Admit: 2017-09-16 | Discharge: 2017-09-16 | Disposition: A | Discharge status: Home / Self Care | Payer: Medicare Other | Source: Ambulatory Visit | Attending: Cardiology | Admitting: Cardiology

## 2017-09-16 ENCOUNTER — Other Ambulatory Visit: Payer: Self-pay

## 2017-09-16 VITALS — BP 160/80 | HR 77 | Temp 97.5°F | Resp 16 | Ht 72.0 in | Wt 248.0 lb

## 2017-09-16 DIAGNOSIS — E7800 Pure hypercholesterolemia, unspecified: Secondary | ICD-10-CM | POA: Diagnosis not present

## 2017-09-16 DIAGNOSIS — I4819 Other persistent atrial fibrillation: Secondary | ICD-10-CM

## 2017-09-16 DIAGNOSIS — N182 Chronic kidney disease, stage 2 (mild): Secondary | ICD-10-CM | POA: Diagnosis not present

## 2017-09-16 DIAGNOSIS — I6523 Occlusion and stenosis of bilateral carotid arteries: Secondary | ICD-10-CM | POA: Diagnosis not present

## 2017-09-16 DIAGNOSIS — E119 Type 2 diabetes mellitus without complications: Secondary | ICD-10-CM | POA: Insufficient documentation

## 2017-09-16 DIAGNOSIS — R0989 Other specified symptoms and signs involving the circulatory and respiratory systems: Secondary | ICD-10-CM | POA: Diagnosis not present

## 2017-09-16 DIAGNOSIS — E039 Hypothyroidism, unspecified: Secondary | ICD-10-CM

## 2017-09-16 DIAGNOSIS — E1122 Type 2 diabetes mellitus with diabetic chronic kidney disease: Secondary | ICD-10-CM

## 2017-09-16 DIAGNOSIS — R51 Headache: Secondary | ICD-10-CM | POA: Insufficient documentation

## 2017-09-16 DIAGNOSIS — I481 Persistent atrial fibrillation: Secondary | ICD-10-CM

## 2017-09-16 DIAGNOSIS — E785 Hyperlipidemia, unspecified: Secondary | ICD-10-CM | POA: Diagnosis not present

## 2017-09-16 DIAGNOSIS — I10 Essential (primary) hypertension: Secondary | ICD-10-CM | POA: Insufficient documentation

## 2017-09-16 MED ORDER — LEVOTHYROXINE SODIUM 125 MCG PO TABS: ORAL_TABLET | ORAL | 2 refills | Status: DC

## 2017-09-16 MED ORDER — CARVEDILOL 12.5 MG PO TABS: 12.5000 mg | ORAL_TABLET | Freq: Two times a day (BID) | ORAL | 3 refills | Status: DC

## 2017-09-16 MED ORDER — ATORVASTATIN CALCIUM 80 MG PO TABS: ORAL_TABLET | ORAL | 1 refills | Status: DC

## 2017-09-16 MED ORDER — LOSARTAN POTASSIUM 100 MG PO TABS: 100.0000 mg | ORAL_TABLET | Freq: Every day | ORAL | 2 refills | Status: DC

## 2017-09-16 NOTE — Patient Instructions (Addendum)
Monitor your BP over the weekend You can not get the losartan right now Do not take the losartan AND the micardis together If your BP is still above 140/80 over the weekend CALL the office on Monday and we will switch the micardis to benicar  Monitor your blood pressure at home, please keep a record and bring that in with you to your next office visit.   Go to the ER if any CP, SOB, nausea, dizziness, severe HA, changes vision/speech  Due to a recent study, SPRINT, we have changed our goal for the systolic or top blood pressure number. Ideally we want your top number at 120.  In the [facility] Trial, 5000 people were randomized to a goal BP of 120 and 5000 people were randomized to a goal BP of less than 140. The patients with the goal BP at 120 had LESS DEMENTIA, LESS HEART ATTACKS, AND LESS STROKES, AS WELL AS OVERALL DECREASED MORTALITY OR DEATH RATE.   If you are willing, our goal BP is the top number of 120.  Your most recent BP: BP: (!) 160/80   Take your medications faithfully as instructed. Maintain a healthy weight. Get at least 150 minutes of aerobic exercise per week. Minimize salt intake. Minimize alcohol intake  DASH Eating Plan DASH stands for "Dietary Approaches to Stop Hypertension." The DASH eating plan is a healthy eating plan that has been shown to reduce high blood pressure (hypertension). Additional health benefits may include reducing the risk of type 2 diabetes mellitus, heart disease, and stroke. The DASH eating plan may also help with weight loss. WHAT DO I NEED TO KNOW ABOUT THE DASH EATING PLAN? For the DASH eating plan, you will follow these general guidelines:  Choose foods with a percent daily value for sodium of less than 5% (as listed on the food label).  Use salt-free seasonings or herbs instead of table salt or sea salt.  Check with your health care provider or pharmacist before using salt substitutes.  Eat lower-sodium products, often labeled as "lower  sodium" or "no salt added."  Eat fresh foods.  Eat more vegetables, fruits, and low-fat dairy products.  Choose whole grains. Look for the word "whole" as the first word in the ingredient list.  Choose fish and skinless chicken or [place] more often than red meat. Limit fish, poultry, and meat to 6 oz (170 g) each day.  Limit sweets, desserts, sugars, and sugary drinks.  Choose heart-healthy fats.  Limit cheese to 1 oz (28 g) per day.  Eat more home-cooked food and less restaurant, buffet, and fast food.  Limit fried foods.  Cook foods using methods other than frying.  Limit canned vegetables. If you do use them, rinse them well to decrease the sodium.  When eating at a restaurant, ask that your food be prepared with less salt, or no salt if possible. WHAT FOODS CAN I EAT? Seek help from a dietitian for individual calorie needs. Grains Whole grain or whole wheat bread. Brown rice. Whole grain or whole wheat pasta. Quinoa, bulgur, and whole grain cereals. Low-sodium cereals. Corn or whole wheat flour tortillas. Whole grain cornbread. Whole grain crackers. Low-sodium crackers. Vegetables Fresh or frozen vegetables (raw, steamed, roasted, or grilled). Low-sodium or reduced-sodium tomato and vegetable juices. Low-sodium or reduced-sodium tomato sauce and paste. Low-sodium or reduced-sodium canned vegetables.  Fruits All fresh, canned (in natural juice), or frozen fruits. Meat and Other Protein Products Ground beef (85% or leaner), grass-fed beef, or beef trimmed  of fat. Skinless chicken or [place]. Ground chicken or [place]. Pork trimmed of fat. All fish and seafood. Eggs. Dried beans, peas, or lentils. Unsalted nuts and seeds. Unsalted canned beans. Dairy Low-fat dairy products, such as skim or 1% milk, 2% or reduced-fat cheeses, low-fat ricotta or cottage cheese, or plain low-fat yogurt. Low-sodium or reduced-sodium cheeses. Fats and Oils Tub margarines without trans fats. Light or  reduced-fat mayonnaise and salad dressings (reduced sodium). Avocado. Safflower, olive, or canola oils. Natural peanut or almond butter. Other Unsalted popcorn and pretzels. The items listed above may not be a complete list of recommended foods or beverages. Contact your dietitian for more options. WHAT FOODS ARE NOT RECOMMENDED? Grains White bread. White pasta. White rice. Refined cornbread. Bagels and croissants. Crackers that contain trans fat. Vegetables Creamed or fried vegetables. Vegetables in a cheese sauce. Regular canned vegetables. Regular canned tomato sauce and paste. Regular tomato and vegetable juices. Fruits Dried fruits. Canned fruit in light or heavy syrup. Fruit juice. Meat and Other Protein Products Fatty cuts of meat. Ribs, chicken wings, bacon, sausage, bologna, salami, chitterlings, fatback, hot dogs, bratwurst, and packaged luncheon meats. Salted nuts and seeds. Canned beans with salt. Dairy Whole or 2% milk, cream, half-and-half, and cream cheese. Whole-fat or sweetened yogurt. Full-fat cheeses or blue cheese. Nondairy creamers and whipped toppings. Processed cheese, cheese spreads, or cheese curds. Condiments Onion and garlic salt, seasoned salt, table salt, and sea salt. Canned and packaged gravies. Worcestershire sauce. Tartar sauce. Barbecue sauce. Teriyaki sauce. Soy sauce, including reduced sodium. Steak sauce. Fish sauce. Oyster sauce. Cocktail sauce. Horseradish. Ketchup and mustard. Meat flavorings and tenderizers. Bouillon cubes. Hot sauce. Tabasco sauce. Marinades. Taco seasonings. Relishes. Fats and Oils Butter, stick margarine, lard, shortening, ghee, and bacon fat. Coconut, palm kernel, or palm oils. Regular salad dressings. Other Pickles and olives. Salted popcorn and pretzels. The items listed above may not be a complete list of foods and beverages to avoid. Contact your dietitian for more information. WHERE CAN I FIND MORE INFORMATION? National Heart,  Lung, and Blood Institute: travelstabloid.com Document Released: 04/30/2011 Document Revised: 09/25/2013 Document Reviewed: 03/15/2013 [facility] Patient Information 2015 [place], [place]. This information is not intended to replace advice given to you by your health care provider. Make sure you discuss any questions you have with your health care provider.  About Constipation  Constipation Overview Constipation is the most common gastrointestinal complaint - about 4 million Americans experience constipation and make 2.5 million physician visits a year to get help for the problem.  Constipation can occur when the colon absorbs too much water, the colon's muscle contraction is slow or sluggish, and/or there is delayed transit time through the colon.  The result is stool that is hard and dry.  Indicators of constipation include straining during bowel movements greater than 25% of the time, having fewer than three bowel movements per week, and/or the feeling of incomplete evacuation.  There are established guidelines ([place] II ) for defining constipation. A person needs to have two or more of the following symptoms for at least 12 weeks (not necessarily consecutive) in the preceding 12 months: . Straining in  greater than 25% of bowel movements . Lumpy or hard stools in greater than 25% of bowel movements . Sensation of incomplete emptying in greater than 25% of bowel movements . Sensation of anorectal obstruction/blockade in greater than 25% of bowel movements . Manual maneuvers to help empty greater than 25% of bowel movements (e.g., digital evacuation, support of the  pelvic floor)  . Less than  3 bowel movements/week . Loose stools are not present, and criteria for irritable bowel syndrome are insufficient  Common Causes of Constipation . Lack of fiber in your diet . Lack of physical activity . Medications, including iron and calcium supplements  . Dairy  intake . Dehydration . Abuse of laxatives  Travel  Irritable Bowel Syndrome  Pregnancy  Luteal phase of menstruation (after ovulation and before menses)  Colorectal problems  Intestinal Dysfunction  Treating Constipation  There are several ways of treating constipation, including changes to diet and exercise, use of laxatives, adjustments to the pelvic floor, and scheduled toileting.  These treatments include: . increasing fiber and fluids in the diet  . increasing physical activity . learning muscle coordination   learning proper toileting techniques and toileting modifications   designing and sticking  to a toileting schedule     2007, [employer] Doc.22

## 2017-09-17 ENCOUNTER — Encounter: Payer: Self-pay | Admitting: Cardiology

## 2017-09-17 DIAGNOSIS — I6529 Occlusion and stenosis of unspecified carotid artery: Secondary | ICD-10-CM | POA: Insufficient documentation

## 2017-09-20 LAB — HEMOGLOBIN A1C
EAG (MMOL/L): 8.4 (calc)
Hgb A1c MFr Bld: 6.9 % of total Hgb — ABNORMAL HIGH (ref <5.7)
Mean Plasma Glucose: 151 (calc)

## 2017-09-20 LAB — COMPLETE METABOLIC PANEL WITH GFR
AG Ratio: 1.3 (calc) (ref 1.0–2.5)
ALT: 22 U/L (ref 9–46)
AST: 17 U/L (ref 10–35)
Albumin: 4.3 g/dL (ref 3.6–5.1)
Alkaline phosphatase (APISO): 109 U/L (ref 40–115)
BUN/Creatinine Ratio: 15 (calc) (ref 6–22)
BUN: 20 mg/dL (ref 7–25)
CALCIUM: 10 mg/dL (ref 8.6–10.3)
CO2: 31 mmol/L (ref 20–32)
CREATININE: 1.3 mg/dL — AB (ref 0.70–1.25)
Chloride: 101 mmol/L (ref 98–110)
GFR, EST NON AFRICAN AMERICAN: 56 mL/min/{1.73_m2} — AB (ref >=60)
GFR, Est African American: 65 mL/min/{1.73_m2} (ref >=60)
Globulin: 3.2 g/dL (calc) (ref 1.9–3.7)
Glucose, Bld: 177 mg/dL — ABNORMAL HIGH (ref 65–99)
POTASSIUM: 4 mmol/L (ref 3.5–5.3)
Sodium: 142 mmol/L (ref 135–146)
Total Bilirubin: 0.6 mg/dL (ref 0.2–1.2)
Total Protein: 7.5 g/dL (ref 6.1–8.1)

## 2017-09-20 LAB — CBC WITH DIFFERENTIAL/PLATELET
Basophils Absolute: 42 cells/uL (ref 0–200)
Basophils Relative: 0.5 %
Eosinophils Absolute: 160 cells/uL (ref 15–500)
Eosinophils Relative: 1.9 %
HCT: 41.8 % (ref 38.5–50.0)
Hemoglobin: 14.9 g/dL (ref 13.2–17.1)
LYMPHS ABS: 2050 {cells}/uL (ref 850–3900)
MCH: 33.6 pg — ABNORMAL HIGH (ref 27.0–33.0)
MCHC: 35.6 g/dL (ref 32.0–36.0)
MCV: 94.1 fL (ref 80.0–100.0)
MPV: 10.5 fL (ref 7.5–12.5)
Monocytes Relative: 9.1 %
NEUTROS PCT: 64.1 %
Neutro Abs: 5384 cells/uL (ref 1500–7800)
PLATELETS: 237 10*3/uL (ref 140–400)
RBC: 4.44 10*6/uL (ref 4.20–5.80)
RDW: 14.6 % (ref 11.0–15.0)
Total Lymphocyte: 24.4 %
WBC mixed population: 764 cells/uL (ref 200–950)
WBC: 8.4 10*3/uL (ref 3.8–10.8)

## 2017-09-20 LAB — LIPID PANEL
CHOL/HDL RATIO: 3.3 (calc) (ref <5.0)
CHOLESTEROL: 134 mg/dL (ref <200)
HDL: 41 mg/dL (ref >40)
LDL CHOLESTEROL (CALC): 57 mg/dL
Non-HDL Cholesterol (Calc): 93 mg/dL (calc) (ref <130)
TRIGLYCERIDES: 339 mg/dL — AB (ref <150)

## 2017-09-20 LAB — TSH: TSH: 0.71 mIU/L (ref 0.40–4.50)

## 2017-09-28 ENCOUNTER — Telehealth: Payer: Self-pay

## 2017-09-28 ENCOUNTER — Ambulatory Visit (INDEPENDENT_AMBULATORY_CARE_PROVIDER_SITE_OTHER): Payer: Medicare Other | Admitting: Internal Medicine

## 2017-09-28 ENCOUNTER — Encounter: Payer: Self-pay | Admitting: Internal Medicine

## 2017-09-28 VITALS — BP 146/80 | HR 72 | Ht 72.0 in | Wt 245.1 lb

## 2017-09-28 DIAGNOSIS — I4819 Other persistent atrial fibrillation: Secondary | ICD-10-CM

## 2017-09-28 DIAGNOSIS — I481 Persistent atrial fibrillation: Secondary | ICD-10-CM | POA: Diagnosis not present

## 2017-09-28 DIAGNOSIS — Z800 Family history of malignant neoplasm of digestive organs: Secondary | ICD-10-CM | POA: Diagnosis not present

## 2017-09-28 DIAGNOSIS — Z7901 Long term (current) use of anticoagulants: Secondary | ICD-10-CM

## 2017-09-28 DIAGNOSIS — I6523 Occlusion and stenosis of bilateral carotid arteries: Secondary | ICD-10-CM | POA: Diagnosis not present

## 2017-09-28 DIAGNOSIS — Z86010 Personal history of colonic polyps: Secondary | ICD-10-CM

## 2017-09-28 DIAGNOSIS — R195 Other fecal abnormalities: Secondary | ICD-10-CM | POA: Diagnosis not present

## 2017-09-28 NOTE — Progress Notes (Signed)
Christopher Wade 67 y.o. 10-Jun-1950 [id number]  Assessment & Plan:   Encounter Diagnoses  Name Primary?  . Positive colorectal cancer screening using Cologuard test Yes  . Hx of adenomatous colonic polyps   . Family history of colon cancer   . Long term current use of anticoagulant - xarelto   . Persistent atrial fibrillation (HCC)CHA2DS2-VASc Score 4    Colonoscopy is appropriate for this patient given the positive Cologuard and his personal history of adenomatous polyps of the colon and family history of colon cancer. The latter two are sufficient to recommend repeat colonoscopy.  He takes Xarelto for atrial fibrillation, I would like to hold that 2 days prior to his colonoscopy and will clarify with cardiology.  I have explained the extra rare but real risk of stroke off of Xarelto.  Would like to minimize the time off it I do think 2 days prior to the colonoscopy makes sense given his creatinine level but again will defer to cardiology for final recommendation.  The other risks and benefits as well as alternatives of endoscopic procedure(s) have been discussed and reviewed. All questions answered. The patient agrees to proceed.  I appreciate the opportunity to care for this patient. CC: Christopher Pinto, MD   Subjective:   Chief Complaint: Positive Cologuard  HPI The patient is a 67 year old white man with a history of 2 adenomatous polyps removed by me and colonoscopy in 2009 and a brother and/or sister with colon cancer who recently underwent Cologuard testing which was positive.  He takes Xarelto for stroke prophylaxis and [name] and also has coronary artery disease.  He has had some intermittent bleeding from his gums.  He had worse problems when he was on Plavix and Xarelto.  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  He says the people he used to work for remember when he was out of breath and not doing well prior to his cardiac interventions.  He denies any gastrointestinal problems. Allergies  Allergen Reactions  . Amiodarone Other (See Comments)    Lung toxicity  . Bee Venom Other (See Comments)    Leg went numb   Current Meds  Medication Sig  . allopurinol (ZYLOPRIM) 300 MG tablet Take 150 mg by mouth daily.  [name] amLODipine (NORVASC) 5 MG tablet Take 1.5 tablets by mouth daily  . atorvastatin (LIPITOR) 80 MG tablet take 1 tablet by mouth once daily AT 6PM for cholesterol  . B Complex Vitamins (B COMPLEX PO) Take 1 tablet by mouth daily.  . carvedilol (COREG) 12.5 MG tablet Take 1 tablet (12.5 mg total) by mouth 2 (two) times daily.  . cholecalciferol (VITAMIN D) 1000 UNITS tablet Take 2,000 Units by mouth daily.  . colchicine 0.6 MG tablet TAKE 1 TABLET BY MOUTH 1  TIME DAILY TO PREVENT GOUT  . levothyroxine (SYNTHROID, LEVOTHROID) 125 MCG tablet take 1 tablet by mouth every morning ON AN EMPTY STOMACH- WAIT 30 MINUTES TO EAT, NO ANTACIDS FOR 4 HOURS  . loratadine (CLARITIN) 10 MG tablet Take 10 mg by mouth daily.  . magnesium gluconate (MAGONATE) 500 MG tablet Take 1,000 mg by mouth 2 (two) times daily.   . metFORMIN (GLUCOPHAGE) 500 MG tablet Take 500  mg by mouth. Take 2 tablets in the a.m. and 1 tablet in the p.m by mouth  . nystatin cream (MYCOSTATIN) Apply 1 application topically 2 (two) times daily as needed (wound care).  . rivaroxaban (XARELTO) 20 MG TABS tablet take 1 tablet by mouth once daily WITH SUPPER  . telmisartan (MICARDIS) 80 MG tablet Take 1 tablet (80 mg total) by mouth daily.   Past Medical History:  Diagnosis Date  . Arthritis    "joints get stiff" (06/11/2016)  . Bee sting allergy   . Bee sting allergy   . CAD (coronary artery disease), native coronary artery  02/12/2016   cath showing 50% LM, 40% distal LAD, 70% ost to prox LCs, 40% mid LAD and 95% mid to dist RCA s/p PCI with DES x 2 and stage PCI of the LCx/OM.  [name] Carotid artery stenosis    1-39% bilateral by dopplers 08/2017  . History of gout   . Hyperlipidemia   . Hypertension   . Hypothyroidism   . OSA (obstructive sleep apnea)    "tried CPAP; couldn't do it" (06/11/2016)  . Persistent atrial fibrillation ([place])    s/p DCCV 03/2015 with reversion back to atrial fibrillation, loaded with Amio and s/p DCCV to NSR 11/2015.  He is now s/p afib albation.  He had amio lung toxicity and amio was stopped.  . Thyroid disease   . Type II diabetes mellitus ([name])   . Vitamin D deficiency    Past Surgical History:  Procedure Laterality Date  . CARDIAC CATHETERIZATION N/A 10/11/2015   Procedure: Left Heart Cath and Coronary Angiography;  Surgeon: [name], MD;  Location: [facility];  Service: Cardiovascular;  Laterality: N/A;  . CARDIAC CATHETERIZATION N/A 10/11/2015   Procedure: Coronary Stent Intervention;  Surgeon: [name], MD;  Location: [facility];  Service: Cardiovascular;  Laterality: N/A;  . CARDIAC CATHETERIZATION N/A 10/11/2015   Procedure: Intravascular Pressure Wire/FFR Study;  Surgeon: [name], MD;  Location: [facility];  Service: Cardiovascular;  Laterality: N/A;  . CARDIAC CATHETERIZATION N/A 10/14/2015   Procedure: Coronary Stent Intervention;  Surgeon: [name], MD;  Location: [facility];  Service: Cardiovascular;  Laterality: N/A;  . CARDIOVERSION N/A 04/05/2015   Procedure: CARDIOVERSION;  Surgeon: [name], MD;  Location: [facility];  Service: Cardiovascular;  Laterality: N/A;  . CARDIOVERSION N/A 12/09/2015   Procedure: CARDIOVERSION;  Surgeon: [name], MD;  Location: [facility];  Service: Cardiovascular;  Laterality: N/A;  . CARDIOVERSION N/A 03/16/2016   Procedure: CARDIOVERSION;  Surgeon: [name], MD;  Location: [facility];  Service: Cardiovascular;  Laterality: N/A;  . CORONARY ANGIOPLASTY    . ELECTROPHYSIOLOGIC STUDY N/A 06/11/2016   Procedure: Atrial Fibrillation Ablation;  Surgeon: Will [name], MD;  Location: [facility];  Service: Cardiovascular;  Laterality: N/A;  . JOINT REPLACEMENT    . TOTAL HIP ARTHROPLASTY Bilateral 2001  . UVULECTOMY     Social History   Social History Narrative   He is single, he has been in [employer] for his whole life.  Currently struggling to find much work.09/28/2017   Denies alcohol tobacco or drug use   family history includes Colon cancer in his sister; Lung cancer in his father and mother.   Review of Systems See HPI.  All other review of systems appear negative at this time other than some arthritis pains.  Objective:   Physical Exam @BP  [name])  146/80   Pulse 72   Ht 6' (1.829 m)   Wt 245 lb 2 oz (111.2 kg)   BMI 33.24 kg/m @  General:  Well-developed, well-nourished and in no acute distress Eyes:  anicteric. ENT:   Mouth and posterior pharynx free of lesions.  Lungs: Clear to auscultation bilaterally. Heart:  S1S2, no rubs, murmurs, gallops. Abdomen: Obese soft, non-tender, no hepatosplenomegaly, hernia, or mass and BS+.  There is a good sized diastases recti Rectal: Deferred until colonoscopy Psych:  appropriate mood and  Affect.   Data Reviewed: See HPI I have reviewed recent PCP notes and labs from 2019. Also reviewed cardiology notes

## 2017-09-28 NOTE — Patient Instructions (Signed)
You have been scheduled for a colonoscopy. Please follow written instructions given to you at your visit today.  Please pick up your prep supplies at the pharmacy. If you use inhalers (even only as needed), please bring them with you on the day of your procedure.   You will be contacted by our office prior to your procedure for directions on holding your Xarelto.  If you do not hear from our office 1 week prior to your scheduled procedure, please call [phone] to discuss.    I appreciate the opportunity to care for you. Silvano Rusk, MD, [facility]

## 2017-09-30 DIAGNOSIS — E119 Type 2 diabetes mellitus without complications: Secondary | ICD-10-CM | POA: Diagnosis not present

## 2017-09-30 DIAGNOSIS — H25813 Combined forms of age-related cataract, bilateral: Secondary | ICD-10-CM | POA: Diagnosis not present

## 2017-09-30 DIAGNOSIS — H04123 Dry eye syndrome of bilateral lacrimal glands: Secondary | ICD-10-CM | POA: Diagnosis not present

## 2017-09-30 LAB — HM DIABETES EYE EXAM

## 2017-09-30 NOTE — Telephone Encounter (Signed)
  [facility] Pre-operative Risk Assessment     Request for surgical clearance:     Endoscopy Procedure  What type of surgery is being performed?     colonoscopy  When is this surgery scheduled?     10/05/17  What type of clearance is required ?   Pharmacy  Are there any medications that need to be held prior to surgery and how long? Xarelto, 2 days  Practice name and name of physician performing surgery?      [facility]  What is your office phone and fax number?      Phone- [phone]  [phone]  Anesthesia type (None, local, MAC, general) ?       MAC

## 2017-10-01 NOTE — Telephone Encounter (Signed)
Pt returned call and confirmed he received message about holding his bloodthinner

## 2017-10-01 NOTE — Telephone Encounter (Signed)
Patient with diagnosis of Afib on Xarelto for anticoagulation.    Procedure: endoscopy Date of procedure: 10/05/17  CHADS2-VASc score of  4 (CHF, HTN, AGE, DM2, stroke/tia x 2, CAD, AGE, male)  CrCl 23ml/min  Per office protocol, patient can hold Xarelto for 24 hours prior to procedure.

## 2017-10-01 NOTE — Telephone Encounter (Signed)
I left a detailed message on his cell/home # regarding holding his blood thinner. Told him to call me back so I can make sure he gets the message.

## 2017-10-01 NOTE — Telephone Encounter (Signed)
   Primary Cardiologist: Fransico Him, MD  Chart reviewed as part of pre-operative protocol coverage. Given past medical history and time since last visit, based on ACC/AHA guidelines, DEKKER VERGA would be at acceptable risk for the planned procedure without further cardiovascular testing.   See attached recommendations for anticoagulation.   I will route this recommendation to the requesting party via Epic fax function and remove from pre-op pool.  Please call with questions.  Daune Perch, NP 10/01/2017, 12:50 PM

## 2017-10-05 ENCOUNTER — Encounter: Payer: Self-pay | Admitting: *Deleted

## 2017-10-05 ENCOUNTER — Ambulatory Visit (AMBULATORY_SURGERY_CENTER): Payer: Medicare Other | Admitting: Internal Medicine

## 2017-10-05 ENCOUNTER — Other Ambulatory Visit: Payer: Self-pay

## 2017-10-05 ENCOUNTER — Encounter: Payer: Self-pay | Admitting: Internal Medicine

## 2017-10-05 VITALS — BP 150/71 | HR 70 | Temp 98.6°F | Resp 13 | Ht 72.0 in | Wt 245.0 lb

## 2017-10-05 DIAGNOSIS — D123 Benign neoplasm of transverse colon: Secondary | ICD-10-CM

## 2017-10-05 DIAGNOSIS — Z800 Family history of malignant neoplasm of digestive organs: Secondary | ICD-10-CM | POA: Diagnosis not present

## 2017-10-05 DIAGNOSIS — I4891 Unspecified atrial fibrillation: Secondary | ICD-10-CM | POA: Diagnosis not present

## 2017-10-05 DIAGNOSIS — E119 Type 2 diabetes mellitus without complications: Secondary | ICD-10-CM | POA: Diagnosis not present

## 2017-10-05 DIAGNOSIS — R195 Other fecal abnormalities: Secondary | ICD-10-CM

## 2017-10-05 DIAGNOSIS — Z86010 Personal history of colonic polyps: Secondary | ICD-10-CM

## 2017-10-05 DIAGNOSIS — G4733 Obstructive sleep apnea (adult) (pediatric): Secondary | ICD-10-CM | POA: Diagnosis not present

## 2017-10-05 DIAGNOSIS — I2510 Atherosclerotic heart disease of native coronary artery without angina pectoris: Secondary | ICD-10-CM | POA: Diagnosis not present

## 2017-10-05 DIAGNOSIS — I10 Essential (primary) hypertension: Secondary | ICD-10-CM | POA: Diagnosis not present

## 2017-10-05 MED ORDER — SODIUM CHLORIDE 0.9 % IV SOLN: 500.0000 mL | Freq: Once | INTRAVENOUS | Status: DC

## 2017-10-05 NOTE — Patient Instructions (Addendum)
I found and removed one tiny polyp and saw hemorrhoids.  I will let you know pathology results and when to have another routine colonoscopy by mail and/or My Chart.  I do not think stool tests are right for you due to using a strong blood thinner.  I appreciate the opportunity to care for you. Gatha Mayer, MD, FACG  YOU HAD AN ENDOSCOPIC PROCEDURE TODAY AT [facility]:   Refer to the procedure report that was given to you for any specific questions about what was found during the examination.  If the procedure report does not answer your questions, please call your gastroenterologist to clarify.  If you requested that your care partner not be given the details of your procedure findings, then the procedure report has been included in a sealed envelope for you to review at your convenience later.  YOU SHOULD EXPECT: Some feelings of bloating in the abdomen. Passage of more gas than usual.  Walking can help get rid of the air that was put into your GI tract during the procedure and reduce the bloating. If you had a lower endoscopy (such as a colonoscopy or flexible sigmoidoscopy) you may notice spotting of blood in your stool or on the toilet paper. If you underwent a bowel prep for your procedure, you may not have a normal bowel movement for a few days.  Please Note:  You might notice some irritation and congestion in your nose or some drainage.  This is from the oxygen used during your procedure.  There is no need for concern and it should clear up in a day or so.  SYMPTOMS TO REPORT IMMEDIATELY:   Following lower endoscopy (colonoscopy or flexible sigmoidoscopy):  Excessive amounts of blood in the stool  Significant tenderness or worsening of abdominal pains  Swelling of the abdomen that is new, acute  Fever of 100F or higher   Following upper endoscopy (EGD)  Vomiting of blood or coffee ground material  New chest pain or pain under the shoulder  blades  Painful or persistently difficult swallowing  New shortness of breath  Fever of 100F or higher  Black, tarry-looking stools  For urgent or emergent issues, a gastroenterologist can be reached at any hour by calling [phone].   DIET:  We do recommend a small meal at first, but then you may proceed to your regular diet.  Drink plenty of fluids but you should avoid alcoholic beverages for 24 hours.  ACTIVITY:  You should plan to take it easy for the rest of today and you should NOT DRIVE or use heavy machinery until tomorrow (because of the sedation medicines used during the test).    FOLLOW UP: Our staff will call the number listed on your records the next business day following your procedure to check on you and address any questions or concerns that you may have regarding the information given to you following your procedure. If we do not reach you, we will leave a message.  However, if you are feeling well and you are not experiencing any problems, there is no need to return our call.  We will assume that you have returned to your regular daily activities without incident.  If any biopsies were taken you will be contacted by phone or by letter within the next 1-3 weeks.  Please call us at [phone] if you have not heard about the biopsies in 3 weeks.    SIGNATURES/CONFIDENTIALITY: You and/or your care  partner have signed paperwork which will be entered into your electronic medical record.  These signatures attest to the fact that that the information above on your After Visit Summary has been reviewed and is understood.  Full responsibility of the confidentiality of this discharge information lies with you and/or your care-partner.  Polyp and hemorrhoid information given.  Resume Xarelto at prior dose today

## 2017-10-05 NOTE — Progress Notes (Signed)
Pt's states no medical or surgical changes since previsit or office visit. 

## 2017-10-05 NOTE — Progress Notes (Signed)
Report to PACU, RN, vss, BBS= Clear.  

## 2017-10-05 NOTE — Op Note (Signed)
[place] Patient Name: Christopher Wade Procedure Date: 10/05/2017 3:13 PM MRN: [id number] Endoscopist: Gatha Mayer , MD Age: 67 Referring MD:  Date of Birth: 1951-01-14 Gender: Male Account #: [id number] Procedure:                Colonoscopy Indications:              Positive Cologuard test Medicines:                Propofol per Anesthesia, Monitored Anesthesia Care Procedure:                Pre-Anesthesia Assessment:                           - Prior to the procedure, a History and Physical                            was performed, and patient medications and                            allergies were reviewed. The patient's tolerance of                            previous anesthesia was also reviewed. The risks                            and benefits of the procedure and the sedation                            options and risks were discussed with the patient.                            All questions were answered, and informed consent                            was obtained. Prior Anticoagulants: The patient                            last took Xarelto (rivaroxaban) 2 days prior to the                            procedure. ASA Grade Assessment: III - A patient                            with severe systemic disease. After reviewing the                            risks and benefits, the patient was deemed in                            satisfactory condition to undergo the procedure.                           After obtaining informed consent, the colonoscope  was passed under direct vision. Throughout the                            procedure, the patient's blood pressure, pulse, and                            oxygen saturations were monitored continuously. The                            Colonoscope was introduced through the anus and                            advanced to the the cecum, identified by                            appendiceal orifice  and ileocecal valve. The                            colonoscopy was performed without difficulty. The                            patient tolerated the procedure well. The quality                            of the bowel preparation was adequate. The bowel                            preparation used was Miralax. The ileocecal valve,                            appendiceal orifice, and rectum were photographed. Scope In: 3:30:30 PM Scope Out: 3:48:10 PM Scope Withdrawal Time: 0 hours 13 minutes 59 seconds  Total Procedure Duration: 0 hours 17 minutes 40 seconds  Findings:                 The perianal and digital rectal examinations were                            normal. Pertinent negatives include normal prostate                            (size, shape, and consistency).                           A diminutive polyp was found in the transverse                            colon. The polyp was sessile. The polyp was removed                            with a cold snare. Resection and retrieval were                            complete. Verification of patient identification  for the specimen was done. Estimated blood loss was                            minimal.                           Internal hemorrhoids were found during retroflexion.                           The exam was otherwise without abnormality on                            direct and retroflexion views. Complications:            No immediate complications. Estimated Blood Loss:     Estimated blood loss was minimal. Impression:               - One diminutive polyp in the transverse colon,                            removed with a cold snare. Resected and retrieved.                           - Internal hemorrhoids.                           - The examination was otherwise normal on direct                            and retroflexion views.                           - Personal history of colonic  polyps. Recommendation:           - Patient has a contact number available for                            emergencies. The signs and symptoms of potential                            delayed complications were discussed with the                            patient. Return to normal activities tomorrow.                            Written discharge instructions were provided to the                            patient.                           - Resume previous diet.                           - Continue present medications.                           -  Resume Xarelto (rivaroxaban) at prior dose today.                           - Repeat colonoscopy is recommended for                            surveillance. The colonoscopy date will be                            determined after pathology results from today's                            exam become available for review.                           - Avoid stool based tests in him: - hx polyps and                            anticoagulants will increase risk of false + Gatha Mayer, MD 10/05/2017 3:59:36 PM This report has been signed electronically.

## 2017-10-05 NOTE — Progress Notes (Signed)
Called to room to assist during endoscopic procedure.  Patient ID and intended procedure confirmed with present staff. Received instructions for my participation in the procedure from the performing physician.  

## 2017-10-06 ENCOUNTER — Telehealth: Payer: Self-pay

## 2017-10-06 NOTE — Telephone Encounter (Signed)
  Follow up Call-  Call back number 10/05/2017  Post procedure Call Back phone  # [phone]  Permission to leave phone message Yes  Some recent data might be hidden     Patient questions:  Do you have a fever, pain , or abdominal swelling? No. Pain Score  0 *  Have you tolerated food without any problems? Yes.    Have you been able to return to your normal activities? Yes.    Do you have any questions about your discharge instructions: Diet   No. Medications  No. Follow up visit  No.  Do you have questions or concerns about your Care? No.  Actions: * If pain score is 4 or above: No action needed, pain <4.

## 2017-10-15 ENCOUNTER — Encounter: Payer: Self-pay | Admitting: Internal Medicine

## 2017-10-15 DIAGNOSIS — Z86010 Personal history of colonic polyps: Secondary | ICD-10-CM

## 2017-10-15 NOTE — Progress Notes (Signed)
ssp x 1 - diminutive Colon recall 2024

## 2017-10-25 DIAGNOSIS — H25811 Combined forms of age-related cataract, right eye: Secondary | ICD-10-CM | POA: Diagnosis not present

## 2017-10-25 DIAGNOSIS — H2511 Age-related nuclear cataract, right eye: Secondary | ICD-10-CM | POA: Diagnosis not present

## 2017-12-16 ENCOUNTER — Other Ambulatory Visit: Payer: Self-pay | Admitting: Internal Medicine

## 2017-12-17 ENCOUNTER — Telehealth: Payer: Self-pay | Admitting: *Deleted

## 2017-12-17 MED ORDER — OLMESARTAN MEDOXOMIL 40 MG PO TABS: 40.0000 mg | ORAL_TABLET | Freq: Every day | ORAL | 0 refills | Status: DC

## 2017-12-17 NOTE — Telephone Encounter (Signed)
The patient called and asked which BP medication he should be taking. Losartan was recalled earlier this year and he was changed to Olmesartan 40 mg.  In April, he was changed to Telmisartan 80 mg due to Olmesartan being on backorder.  This week he received Olmesartan 40 mg from [facility]. Per Dr Melford Aase, continue with he Olmesartan 40 mg and his BP will be checked at his 12/22/2017 OV.

## 2017-12-20 NOTE — Progress Notes (Signed)
Electrophysiology Office Note Date: 12/22/2017  ID:  Christopher Wade, Christopher Wade 15-Jul-1950, MRN [id number]  PCP: [name], MD Primary Cardiologist: Radford Pax Electrophysiologist: Curt Bears  CC: AF follow up  Christopher Wade is a 67 y.o. male seen today for Dr Curt Bears.  He presents today for routine electrophysiology followup.  Since last being seen in our clinic, the patient reports doing very well.  He denies chest pain, palpitations, dyspnea, PND, orthopnea, nausea, vomiting, dizziness, syncope, edema, weight gain, or early satiety.  Past Medical History:  Diagnosis Date  . Arthritis    "joints get stiff" (06/11/2016)  . Bee sting allergy   . Bee sting allergy   . CAD (coronary artery disease), native coronary artery 02/12/2016   cath showing 50% LM, 40% distal LAD, 70% ost to prox LCs, 40% mid LAD and 95% mid to dist RCA s/p PCI with DES x 2 and stage PCI of the LCx/OM.  [name] Carotid artery stenosis    1-39% bilateral by dopplers 08/2017  . History of gout   . Hx of adenomatous colonic polyps 04/10/2008  . Hyperlipidemia   . Hypertension   . Hypothyroidism   . OSA (obstructive sleep apnea)    "tried CPAP; couldn't do it" (06/11/2016)  . Persistent atrial fibrillation ([place])    s/p DCCV 03/2015 with reversion back to atrial fibrillation, loaded with Amio and s/p DCCV to NSR 11/2015.  He is now s/p afib albation.  He had amio lung toxicity and amio was stopped.  . Thyroid disease   . Type II diabetes mellitus ([place])   . Vitamin D deficiency    Past Surgical History:  Procedure Laterality Date  . CARDIAC CATHETERIZATION N/A 10/11/2015   Procedure: Left Heart Cath and Coronary Angiography;  Surgeon: [name], MD;  Location: [facility];  Service: Cardiovascular;  Laterality: N/A;  . CARDIAC CATHETERIZATION N/A 10/11/2015   Procedure: Coronary Stent Intervention;  Surgeon: [name], MD;  Location: [facility];  Service: Cardiovascular;  Laterality: N/A;  .  CARDIAC CATHETERIZATION N/A 10/11/2015   Procedure: Intravascular Pressure Wire/FFR Study;  Surgeon: [name], MD;  Location: [facility];  Service: Cardiovascular;  Laterality: N/A;  . CARDIAC CATHETERIZATION N/A 10/14/2015   Procedure: Coronary Stent Intervention;  Surgeon: [name], MD;  Location: [facility];  Service: Cardiovascular;  Laterality: N/A;  . CARDIOVERSION N/A 04/05/2015   Procedure: CARDIOVERSION;  Surgeon: [name], MD;  Location: [facility];  Service: Cardiovascular;  Laterality: N/A;  . CARDIOVERSION N/A 12/09/2015   Procedure: CARDIOVERSION;  Surgeon: [name], MD;  Location: [facility];  Service: Cardiovascular;  Laterality: N/A;  . CARDIOVERSION N/A 03/16/2016   Procedure: CARDIOVERSION;  Surgeon: [name], MD;  Location: [facility];  Service: Cardiovascular;  Laterality: N/A;  . CORONARY ANGIOPLASTY    . ELECTROPHYSIOLOGIC STUDY N/A 06/11/2016   Procedure: Atrial Fibrillation Ablation;  Surgeon: Will [name], MD;  Location: [facility];  Service: Cardiovascular;  Laterality: N/A;  . JOINT REPLACEMENT    . TOTAL HIP ARTHROPLASTY Bilateral 2001  . UVULECTOMY      Current Outpatient Medications  Medication Sig Dispense Refill  . allopurinol (ZYLOPRIM) 300 MG tablet Take 150 mg by mouth daily.    [name] amLODipine (NORVASC) 5 MG tablet Take 1.5 tablets by mouth daily 135 tablet 3  . atorvastatin (LIPITOR) 80 MG tablet take 1 tablet by mouth once daily AT 6PM for  cholesterol 90 tablet 1  . B Complex Vitamins (B COMPLEX PO) Take 1 tablet by mouth daily.    . carvedilol (COREG) 12.5 MG tablet Take 1 tablet (12.5 mg total) by mouth 2 (two) times daily. 180 tablet 3  . cholecalciferol (VITAMIN D) 1000 UNITS tablet Take 2,000 Units by mouth daily.    . colchicine 0.6 MG tablet TAKE 1 TABLET BY MOUTH 1  TIME DAILY TO PREVENT GOUT 90 tablet 0  . levothyroxine (SYNTHROID, LEVOTHROID) 125 MCG tablet take 1 tablet by  mouth every morning ON AN EMPTY STOMACH- WAIT 30 MINUTES TO EAT, NO ANTACIDS FOR 4 HOURS 90 tablet 2  . loratadine (CLARITIN) 10 MG tablet Take 10 mg by mouth daily.    . magnesium gluconate (MAGONATE) 500 MG tablet Take 1,000 mg by mouth 2 (two) times daily.     . metFORMIN (GLUCOPHAGE) 500 MG tablet Take 500 mg by mouth. Take 2 tablets in the a.m. and 1 tablet in the p.m by mouth    . nystatin cream (MYCOSTATIN) Apply 1 application topically 2 (two) times daily as needed (wound care). 30 g 1  . olmesartan (BENICAR) 40 MG tablet Take 1 tablet (40 mg total) by mouth daily. 90 tablet 0  . rivaroxaban (XARELTO) 20 MG TABS tablet take 1 tablet by mouth once daily WITH SUPPER 90 tablet 1   Current Facility-Administered Medications  Medication Dose Route Frequency Provider Last Rate Last Dose  . 0.9 %  sodium chloride infusion  500 mL Intravenous Once Gatha Mayer, MD        Allergies:   Amiodarone and Bee venom   Social History: Social History   Socioeconomic History  . Marital status: Single    Spouse name: Not on file  . Number of children: Not on file  . Years of education: Not on file  . Highest education level: Not on file  Occupational History  . Not on file  Social Needs  . Financial resource strain: Not on file  . Food insecurity:    Worry: Not on file    Inability: Not on file  . Transportation needs:    Medical: Not on file    Non-medical: Not on file  Tobacco Use  . Smoking status: Never Smoker  . Smokeless tobacco: Never Used  Substance and Sexual Activity  . Alcohol use: No  . Drug use: No  . Sexual activity: Not Currently  Lifestyle  . Physical activity:    Days per week: Not on file    Minutes per session: Not on file  . Stress: Not on file  Relationships  . Social connections:    Talks on phone: Not on file    Gets together: Not on file    Attends religious service: Not on file    Active member of club or organization: Not on file    Attends meetings  of clubs or organizations: Not on file    Relationship status: Not on file  . Intimate partner violence:    Fear of current or ex partner: Not on file    Emotionally abused: Not on file    Physically abused: Not on file    Forced sexual activity: Not on file  Other Topics Concern  . Not on file  Social History Narrative   He is single, he has been in [employer] for his whole life.  Currently struggling to find much work.09/28/2017   Denies alcohol tobacco or drug use  Family History: Family History  Problem Relation Age of Onset  . Lung cancer Mother   . Lung cancer Father   . Colon cancer Sister     Review of Systems: All other systems reviewed and are otherwise negative except as noted above.   Physical Exam: VS:  BP (!) 150/80   Pulse 70   Ht 6' (1.829 m)   Wt 237 lb 6.4 oz (107.7 kg)   SpO2 95%   BMI 32.20 kg/m  , BMI Body mass index is 32.2 kg/m. Wt Readings from Last 3 Encounters:  12/22/17 237 lb 6.4 oz (107.7 kg)  10/05/17 245 lb (111.1 kg)  09/28/17 245 lb 2 oz (111.2 kg)    GEN- The patient is well appearing, alert and oriented x 3 today.   HEENT: normocephalic, atraumatic; sclera clear, conjunctiva pink; hearing intact; oropharynx clear; neck supple Lungs- Clear to ausculation bilaterally, normal work of breathing.  No wheezes, rales, rhonchi Heart- Regular rate and rhythm GI- soft, non-tender, non-distended, bowel sounds present Extremities- no clubbing, cyanosis, or edema MS- no significant deformity or atrophy Skin- warm and dry, no rash or lesion  Psych- euthymic mood, full affect Neuro- strength and sensation are intact   EKG:  EKG is not ordered today.  Recent Labs: 06/09/2017: Magnesium 2.0 09/16/2017: ALT 22; BUN 20; Creat 1.30; Hemoglobin 14.9; Platelets 237; Potassium 4.0; Sodium 142; TSH 0.71    Other studies Reviewed: Additional studies/ records that were reviewed today include: Dr Radford Pax and Dr Curt Bears' office notes   Assessment  and Plan:  1.  Persistent atrial fibrillation Doing well s/p ablation off AAD therapy Continue Xarelto for CHADS2VASC of 4 He had amio lung toxicity in the past  2.  HTN Stable No change required today  3.  CAD No recent ischemic symptoms Continue medical therapy  4.  OSA Compliance with CPAP encouraged   5. Obesity Body mass index is 32.2 kg/m. Weight loss encouraged    Current medicines are reviewed at length with the patient today.   The patient does not have concerns regarding his medicines.  The following changes were made today:  none  Labs/ tests ordered today include: none No orders of the defined types were placed in this encounter.    Disposition:   Follow up with Dr Curt Bears 1 year, Dr Radford Pax as scheduled     Signed, Chanetta Marshall, NP 12/22/2017 12:13 PM   [place] [address] [phone] (office) [phone] (fax)

## 2017-12-22 ENCOUNTER — Ambulatory Visit (INDEPENDENT_AMBULATORY_CARE_PROVIDER_SITE_OTHER): Payer: Medicare Other | Admitting: Internal Medicine

## 2017-12-22 ENCOUNTER — Encounter: Payer: Self-pay | Admitting: Internal Medicine

## 2017-12-22 ENCOUNTER — Ambulatory Visit (INDEPENDENT_AMBULATORY_CARE_PROVIDER_SITE_OTHER): Payer: Medicare Other | Admitting: Nurse Practitioner

## 2017-12-22 ENCOUNTER — Encounter: Payer: Self-pay | Admitting: Nurse Practitioner

## 2017-12-22 VITALS — BP 150/80 | HR 70 | Ht 72.0 in | Wt 237.4 lb

## 2017-12-22 VITALS — BP 140/80 | HR 72 | Temp 97.2°F | Resp 16 | Ht 72.0 in | Wt 236.4 lb

## 2017-12-22 DIAGNOSIS — E039 Hypothyroidism, unspecified: Secondary | ICD-10-CM

## 2017-12-22 DIAGNOSIS — Z79899 Other long term (current) drug therapy: Secondary | ICD-10-CM | POA: Diagnosis not present

## 2017-12-22 DIAGNOSIS — E1122 Type 2 diabetes mellitus with diabetic chronic kidney disease: Secondary | ICD-10-CM

## 2017-12-22 DIAGNOSIS — I4819 Other persistent atrial fibrillation: Secondary | ICD-10-CM

## 2017-12-22 DIAGNOSIS — G4733 Obstructive sleep apnea (adult) (pediatric): Secondary | ICD-10-CM

## 2017-12-22 DIAGNOSIS — I481 Persistent atrial fibrillation: Secondary | ICD-10-CM | POA: Diagnosis not present

## 2017-12-22 DIAGNOSIS — N182 Chronic kidney disease, stage 2 (mild): Secondary | ICD-10-CM | POA: Diagnosis not present

## 2017-12-22 DIAGNOSIS — I10 Essential (primary) hypertension: Secondary | ICD-10-CM

## 2017-12-22 DIAGNOSIS — I6523 Occlusion and stenosis of bilateral carotid arteries: Secondary | ICD-10-CM

## 2017-12-22 DIAGNOSIS — E559 Vitamin D deficiency, unspecified: Secondary | ICD-10-CM

## 2017-12-22 DIAGNOSIS — E782 Mixed hyperlipidemia: Secondary | ICD-10-CM | POA: Diagnosis not present

## 2017-12-22 DIAGNOSIS — I2510 Atherosclerotic heart disease of native coronary artery without angina pectoris: Secondary | ICD-10-CM | POA: Diagnosis not present

## 2017-12-22 DIAGNOSIS — M1A9XX0 Chronic gout, unspecified, without tophus (tophi): Secondary | ICD-10-CM

## 2017-12-22 NOTE — Patient Instructions (Signed)
Bleeding Precautions When on Anticoagulant Therapy  WHAT IS ANTICOAGULANT THERAPY? Anticoagulant therapy is taking medicine to prevent or reduce blood clots. It is also called blood thinner therapy. Blood clots that form in your blood vessels can be dangerous. They can break loose and travel to your heart, lungs, or brain. This increases your risk of a heart attack or stroke. Anticoagulant therapy causes blood to clot more slowly. You may need anticoagulant therapy if you have:  A medical condition that increases the likelihood that blood clots will form.  A heart defect or a problem with heart rhythm. It is also a common treatment after heart surgery, such as valve replacement. WHAT ARE COMMON TYPES OF ANTICOAGULANT THERAPY? Anticoagulant medicine can be injected or taken by mouth.If you need anticoagulant therapy quickly at the hospital, the medicine may be injected under your skin or given through an IV tube. Heparin is a common example of an anticoagulant that you may get at the hospital. Most anticoagulant therapy is in the form of pills that you take at home every day. These may include:  Aspirin. This common blood thinner works by preventing blood cells (platelets) from sticking together to form a clot. Aspirin is not as strong as anticoagulants that slow down the time that it takes for your body to form a clot.  Clopidogrel. This is a newer type of drug that affects platelets. It is stronger than aspirin.  Warfarin. This is the most common anticoagulant. It changes the way your body uses vitamin K, a vitamin that helps your blood to clot. The risk of bleeding is higher with warfarin than with aspirin. You will need frequent blood tests to make sure you are taking the safest amount.  New anticoagulants. Several new drugs have been approved. They are all taken by mouth. Studies show that these drugs work as well as warfarin. They do not require blood testing. They may cause less bleeding  risk than warfarin. WHAT DO I NEED TO REMEMBER WHEN TAKING ANTICOAGULANT THERAPY? Anticoagulant therapy decreases your risk of forming a blood clot, but it increases your risk of bleeding. Work closely with your health care provider to make sure you are taking your medicine safely. These tips can help:  Learn ways to reduce your risk of bleeding.  If you are taking warfarin: ? Have blood tests as ordered by your health care provider. ? Do not make any sudden changes to your diet. Vitamin K in your diet can make warfarin less effective. ? Do not get pregnant. This medicine may cause birth defects.  Take your medicine at the same time every day. If you forget to take your medicine, take it as soon as you remember. If you miss a whole day, do not double your dose of medicine. Take your normal dose and call your health care provider to check in.  Do not stop taking your medicine on your own.  Tell your health care provider before you start taking any new medicine, vitamin, or herbal product. Some of these could interfere with your therapy.  Tell all of your health care providers that you are on anticoagulant therapy.  Do not have surgery, medical procedures, or dental work until you tell your health care provider that you are on anticoagulant therapy. WHAT CAN AFFECT HOW ANTICOAGULANTS WORK? Certain foods, vitamins, medicines, supplements, and herbal medicines change the way that anticoagulant therapy works. They may increase or decrease the effects of your anticoagulant therapy. Either result can be dangerous for you.  Many over-the-counter medicines for pain, colds, or stomach problems interfere with anticoagulant therapy. Take these only as told by your health care provider.  Do not drink alcohol. It can interfere with your medicine and increase your risk of an injury that causes bleeding.  If you are taking warfarin, do not begin eating more foods that contain vitamin K. These include  leafy green vegetables. Ask your health care provider if you should avoid any foods. WHAT ARE SOME WAYS TO PREVENT BLEEDING? You can prevent bleeding by taking certain precautions:  Be extra careful when you use knives, scissors, or other sharp objects.  Use an electric razor instead of a blade.  Do not use toothpicks.  Use a soft toothbrush.  Wear shoes that have nonskid soles.  Use bath mats and handrails in your bathroom.  Wear gloves while you do yard work.  Wear a helmet when you ride a bike.  Wear your seat belt.  Prevent falls by removing loose rugs and extension cords from areas where you walk.  Do not play contact sports or participate in other activities that have a high risk of injury. WHEN SHOULD I CONTACT MY HEALTH CARE PROVIDER? Call your health care provider if:  You miss a dose of medicine: ? And you are not sure what to do. ? For more than one day.  You have: ? Menstrual bleeding that is heavier than normal. ? Blood in your urine. ? A bloody nose or bleeding gums. ? Easy bruising. ? Blood in your stool (feces) or have black and tarry stool. ? Side effects from your medicine.  You feel weak or dizzy.  You become pregnant. Seek immediate medical care if:  You have bleeding that will not stop.  You have sudden and severe headache or belly pain.  You vomit or you cough up bright red blood.  You have a severe blow to your head. WHAT ARE SOME QUESTIONS TO ASK MY HEALTH CARE PROVIDER?  What is the best anticoagulant therapy for my condition?  What side effects should I watch for?  When should I take my medicine? What should I do if I forget to take it?  Will I need to have regular blood tests?  Do I need to change my diet? Are there foods or drinks that I should avoid?  What activities are safe for me?  What should I do if I want to get pregnant? This information is not intended to replace advice given to you by your health care provider.  Make sure you discuss any questions you have with your health care provider. Document Released: 04/22/2015 Document Reviewed: 04/22/2015 Elsevier Interactive Patient Education  2017 [employer]  ++++++++++++++++++++++++++++++++++ Recommend Adult Low Dose Aspirin or  coated  Aspirin 81 mg daily  To reduce risk of Colon Cancer 20 %,  Skin Cancer 26 % ,  Melanoma 46%  and  Pancreatic cancer 60% +++++++++++++++++++++++++ Vitamin D goal  is between 70-100.  Please make sure that you are taking your Vitamin D as directed.  It is very important as a natural anti-inflammatory  helping hair, skin, and nails, as well as reducing stroke and heart attack risk.  It helps your bones and helps with mood. It also decreases numerous cancer risks so please take it as directed.  Low Vit D is associated with a 200-300% higher risk for CANCER  and 200-300% higher risk for HEART   ATTACK  &  STROKE.   ...................................... It is also associated with   higher death rate at younger ages,  autoimmune diseases like Rheumatoid arthritis, Lupus, Multiple Sclerosis.    Also many other serious conditions, like depression, Alzheimer's Dementia, infertility, muscle aches, fatigue, fibromyalgia - just to name a few. ++++++++++++++++++++ Recommend the book "The END of DIETING" by Dr Joel Fuhrman  & the book "The END of DIABETES " by Dr Joel Fuhrman At [employer] - get book & Audio CD's    Being diabetic has a  300% increased risk for heart attack, stroke, cancer, and alzheimer- type vascular dementia. It is very important that you work harder with diet by avoiding all foods that are white. Avoid white rice (brown & wild rice is OK), white potatoes (sweetpotatoes in moderation is OK), White bread or wheat bread or anything made out of white flour like bagels, donuts, rolls, buns, biscuits, cakes, pastries, cookies, pizza crust, and pasta (made from white flour & egg whites) - vegetarian pasta or  spinach or wheat pasta is OK. Multigrain breads like Arnold's or [employer], or multigrain sandwich thins or flatbreads.  Diet, exercise and weight loss can reverse and cure diabetes in the early stages.  Diet, exercise and weight loss is very important in the control and prevention of complications of diabetes which affects every system in your body, ie. Brain - dementia/stroke, eyes - glaucoma/blindness, heart - heart attack/heart failure, kidneys - dialysis, stomach - gastric paralysis, intestines - malabsorption, nerves - severe painful neuritis, circulation - gangrene & loss of a leg(s), and finally cancer and Alzheimers.    I recommend avoid fried & greasy foods,  sweets/candy, white rice (brown or wild rice or Quinoa is OK), white potatoes (sweet potatoes are OK) - anything made from white flour - bagels, doughnuts, rolls, buns, biscuits,white and wheat breads, pizza crust and traditional pasta made of white flour & egg white(vegetarian pasta or spinach or wheat pasta is OK).  Multi-grain bread is OK - like multi-grain flat bread or sandwich thins. Avoid alcohol in excess. Exercise is also important.    Eat all the vegetables you want - avoid meat, especially red meat and dairy - especially cheese.  Cheese is the most concentrated form of trans-fats which is the worst thing to clog up our arteries. Veggie cheese is OK which can be found in the fresh produce section at [employer] or [employer] or [employer]  +++++++++++++++++++++ DASH Eating Plan  DASH stands for "Dietary Approaches to Stop Hypertension."   The DASH eating plan is a healthy eating plan that has been shown to reduce high blood pressure (hypertension). Additional health benefits may include reducing the risk of type 2 diabetes mellitus, heart disease, and stroke. The DASH eating plan may also help with weight loss. WHAT DO I NEED TO KNOW ABOUT THE DASH EATING PLAN? For the DASH eating plan, you will follow these general  guidelines:  Choose foods with a percent daily value for sodium of less than 5% (as listed on the food label).  Use salt-free seasonings or herbs instead of table salt or sea salt.  Check with your health care provider or pharmacist before using salt substitutes.  Eat lower-sodium products, often labeled as "lower sodium" or "no salt added."  Eat fresh foods.  Eat more vegetables, fruits, and low-fat dairy products.  Choose whole grains. Look for the word "whole" as the first word in the ingredient list.  Choose fish   Limit sweets, desserts, sugars, and sugary drinks.  Choose heart-healthy fats.  Eat veggie cheese     Eat more home-cooked food and less restaurant, buffet, and fast food.  Limit fried foods.  Cook foods using methods other than frying.  Limit canned vegetables. If you do use them, rinse them well to decrease the sodium.  When eating at a restaurant, ask that your food be prepared with less salt, or no salt if possible.                      WHAT FOODS CAN I EAT? Read Dr Joel Fuhrman's books on The End of Dieting & The End of Diabetes  Grains Whole grain or whole wheat bread. Brown rice. Whole grain or whole wheat pasta. Quinoa, bulgur, and whole grain cereals. Low-sodium cereals. Corn or whole wheat flour tortillas. Whole grain cornbread. Whole grain crackers. Low-sodium crackers.  Vegetables Fresh or frozen vegetables (raw, steamed, roasted, or grilled). Low-sodium or reduced-sodium tomato and vegetable juices. Low-sodium or reduced-sodium tomato sauce and paste. Low-sodium or reduced-sodium canned vegetables.   Fruits All fresh, canned (in natural juice), or frozen fruits.  Protein Products  All fish and seafood.  Dried beans, peas, or lentils. Unsalted nuts and seeds. Unsalted canned beans.  Dairy Low-fat dairy products, such as skim or 1% milk, 2% or reduced-fat cheeses, low-fat ricotta or cottage cheese, or plain low-fat yogurt. Low-sodium or  reduced-sodium cheeses.  Fats and Oils Tub margarines without trans fats. Light or reduced-fat mayonnaise and salad dressings (reduced sodium). Avocado. Safflower, olive, or canola oils. Natural peanut or almond butter.  Other Unsalted popcorn and pretzels. The items listed above may not be a complete list of recommended foods or beverages. Contact your dietitian for more options.  +++++++++++++++  WHAT FOODS ARE NOT RECOMMENDED? Grains/ White flour or wheat flour White bread. White pasta. White rice. Refined cornbread. Bagels and croissants. Crackers that contain trans fat.  Vegetables  Creamed or fried vegetables. Vegetables in a . Regular canned vegetables. Regular canned tomato sauce and paste. Regular tomato and vegetable juices.  Fruits Dried fruits. Canned fruit in light or heavy syrup. Fruit juice.  Meat and Other Protein Products Meat in general - RED meat & White meat.  Fatty cuts of meat. Ribs, chicken wings, all processed meats as bacon, sausage, bologna, salami, fatback, hot dogs, bratwurst and packaged luncheon meats.  Dairy Whole or 2% milk, cream, half-and-half, and cream cheese. Whole-fat or sweetened yogurt. Full-fat cheeses or blue cheese. Non-dairy creamers and whipped toppings. Processed cheese, cheese spreads, or cheese curds.  Condiments Onion and garlic salt, seasoned salt, table salt, and sea salt. Canned and packaged gravies. Worcestershire sauce. Tartar sauce. Barbecue sauce. Teriyaki sauce. Soy sauce, including reduced sodium. Steak sauce. Fish sauce. Oyster sauce. Cocktail sauce. Horseradish. Ketchup and mustard. Meat flavorings and tenderizers. Bouillon cubes. Hot sauce. Tabasco sauce. Marinades. Taco seasonings. Relishes.  Fats and Oils Butter, stick margarine, lard, shortening and bacon fat. Coconut, palm kernel, or palm oils. Regular salad dressings.  Pickles and olives. Salted popcorn and pretzels.  The items listed above may not be a complete  list of foods and beverages to avoid.   

## 2017-12-22 NOTE — Progress Notes (Signed)
This very nice 67 y.o. single WM presents for 3 month follow up with HTN, ASCAD, HLD, T2_NIDDM, Hypothyroidism and Vitamin D Deficiency. Patient has OSA & is intolerant to the mask.      Patient is treated for HTN (2006)n & BP has been controlled at home. Today's BP is at goal -  140/80. Patient has hx/o Afib (2016) an had CV x 2 (2016 & 2017) and then Jan 2018 had an Afib Ablation  (Dr Curt Bears). 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  Patient has had no complaints of any cardiac type chest pain, palpitations, dyspnea / orthopnea / PND, dizziness, claudication, or dependent edema.     Hyperlipidemia is controlled with diet & meds. Patient denies myalgias or other med SE's. Last Lipids were at goal albeit elevated Trig's: Lab Results  Component Value Date   CHOL 134 09/16/2017   HDL 41 09/16/2017   LDLCALC 57 09/16/2017   TRIG 339 (H) 09/16/2017   CHOLHDL 3.3 09/16/2017      Also, the patient has history of  Morbid Obesity (BMI 32+) and T2_NIDDM and has had no symptoms of reactive hypoglycemia, diabetic polys, paresthesias or visual blurring.  Patient is not compliant with diet and last A1c was not at goal: Lab Results  Component Value Date   HGBA1C 6.9 (H) 09/16/2017      Further, the patient also has history of Vitamin D Deficiency ("36" in Dec 2015) and supplements vitamin D without any suspected side-effects. Last vitamin D was still low (goal 70-100):  Lab Results  Component Value Date   VD25OH 53 08/24/2016   Current Outpatient Medications on File Prior to Visit  Medication Sig  . allopurinol (ZYLOPRIM) 300 MG tablet Take 150 mg by mouth daily.  [name] amLODipine (NORVASC) 5 MG tablet Take 1.5 tablets by mouth daily  . atorvastatin (LIPITOR) 80 MG tablet take 1 tablet by mouth once daily AT 6PM for cholesterol  . B Complex Vitamins (B COMPLEX PO) Take 1 tablet by mouth daily.  . carvedilol (COREG) 12.5 MG tablet Take 1 tablet (12.5 mg total)  by mouth 2 (two) times daily.  . cholecalciferol (VITAMIN D) 1000 UNITS tablet Take 2,000 Units by mouth daily.  . colchicine 0.6 MG tablet TAKE 1 TABLET BY MOUTH 1  TIME DAILY TO PREVENT GOUT  . levothyroxine (SYNTHROID, LEVOTHROID) 125 MCG tablet take 1 tablet by mouth every morning ON AN EMPTY STOMACH- WAIT 30 MINUTES TO EAT, NO ANTACIDS FOR 4 HOURS  . loratadine (CLARITIN) 10 MG tablet Take 10 mg by mouth daily.  . magnesium gluconate (MAGONATE) 500 MG tablet Take 1,000 mg by mouth daily.   . metFORMIN (GLUCOPHAGE) 500 MG tablet Take 500 mg by mouth.   . nystatin cream (MYCOSTATIN) Apply 1 application topically 2 (two) times daily as needed (wound care).  [name] olmesartan (BENICAR) 40 MG tablet Take 1 tablet (40 mg total) by mouth daily.  . rivaroxaban (XARELTO) 20 MG TABS tablet take 1 tablet by mouth once daily WITH SUPPER   Current Facility-Administered Medications on File Prior to Visit  Medication  . 0.9 %  sodium chloride infusion   Allergies  Allergen Reactions  . Amiodarone Other (See Comments)    Lung toxicity  . Bee Venom Other (See Comments)    Leg went numb   PMHx:   Past Medical History:  Diagnosis Date  . Arthritis    "joints get  stiff" (06/11/2016)  . Bee sting allergy   . Bee sting allergy   . CAD (coronary artery disease), native coronary artery 02/12/2016   cath showing 50% LM, 40% distal LAD, 70% ost to prox LCs, 40% mid LAD and 95% mid to dist RCA s/p PCI with DES x 2 and stage PCI of the LCx/OM.  [name] Carotid artery stenosis    1-39% bilateral by dopplers 08/2017  . History of gout   . Hx of adenomatous colonic polyps 04/10/2008  . Hyperlipidemia   . Hypertension   . Hypothyroidism   . OSA (obstructive sleep apnea)    "tried CPAP; couldn't do it" (06/11/2016)  . Persistent atrial fibrillation ([place])    s/p DCCV 03/2015 with reversion back to atrial fibrillation, loaded with Amio and s/p DCCV to NSR 11/2015.  He is now s/p afib albation.  He had amio lung toxicity  and amio was stopped.  . Thyroid disease   . Type II diabetes mellitus ([place])   . Vitamin D deficiency    Immunization History  Administered Date(s) Administered  . Influenza Split 04/05/2014  . Influenza-Unspecified 02/02/2013, 05/21/2016  . Pneumococcal Conjugate-13 05/01/2014  . Pneumococcal Polysaccharide-23 05/16/2015  . Td 04/25/2010  . Varicella Zoster Immune Globulin 02/22/2014   Past Surgical History:  Procedure Laterality Date  . CARDIAC CATHETERIZATION N/A 10/11/2015   Procedure: Left Heart Cath and Coronary Angiography;  Surgeon: [name], MD;  Location: [facility];  Service: Cardiovascular;  Laterality: N/A;  . CARDIAC CATHETERIZATION N/A 10/11/2015   Procedure: Coronary Stent Intervention;  Surgeon: [name], MD;  Location: [facility];  Service: Cardiovascular;  Laterality: N/A;  . CARDIAC CATHETERIZATION N/A 10/11/2015   Procedure: Intravascular Pressure Wire/FFR Study;  Surgeon: [name], MD;  Location: [facility];  Service: Cardiovascular;  Laterality: N/A;  . CARDIAC CATHETERIZATION N/A 10/14/2015   Procedure: Coronary Stent Intervention;  Surgeon: [name], MD;  Location: [facility];  Service: Cardiovascular;  Laterality: N/A;  . CARDIOVERSION N/A 04/05/2015   Procedure: CARDIOVERSION;  Surgeon: [name], MD;  Location: [facility];  Service: Cardiovascular;  Laterality: N/A;  . CARDIOVERSION N/A 12/09/2015   Procedure: CARDIOVERSION;  Surgeon: [name], MD;  Location: [facility];  Service: Cardiovascular;  Laterality: N/A;  . CARDIOVERSION N/A 03/16/2016   Procedure: CARDIOVERSION;  Surgeon: [name], MD;  Location: [facility];  Service: Cardiovascular;  Laterality: N/A;  . CORONARY ANGIOPLASTY    . ELECTROPHYSIOLOGIC STUDY N/A 06/11/2016   Procedure: Atrial Fibrillation Ablation;  Surgeon: Will [name], MD;  Location: [facility];  Service: Cardiovascular;  Laterality:  N/A;  . JOINT REPLACEMENT    . TOTAL HIP ARTHROPLASTY Bilateral 2001  . UVULECTOMY     FHx:    Reviewed / unchanged  SHx:    Reviewed / unchanged   Systems Review:  Constitutional: Denies fever, chills, wt changes, headaches, insomnia, fatigue, night sweats, change in appetite. Eyes: Denies redness, blurred vision, diplopia, discharge, itchy, watery eyes.  ENT: Denies discharge, congestion, post nasal drip, epistaxis, sore throat, earache, hearing loss, dental pain, tinnitus, vertigo, sinus pain, snoring.  CV: Denies chest pain, palpitations, irregular heartbeat, syncope, dyspnea, diaphoresis, orthopnea, PND, claudication or edema. Respiratory: denies cough, dyspnea, DOE, pleurisy, hoarseness, laryngitis, wheezing.  Gastrointestinal: Denies dysphagia, odynophagia, heartburn, reflux, water brash, abdominal pain or cramps, nausea, vomiting, bloating, diarrhea, constipation, hematemesis, melena, hematochezia  or hemorrhoids. Genitourinary: Denies dysuria, frequency, urgency, nocturia,  hesitancy, discharge, hematuria or flank pain. Musculoskeletal: Denies arthralgias, myalgias, stiffness, jt. swelling, pain, limping or strain/sprain.  Skin: Denies pruritus, rash, hives, warts, acne, eczema or change in skin lesion(s). Neuro: No weakness, tremor, incoordination, spasms, paresthesia or pain. Psychiatric: Denies confusion, memory loss or sensory loss. Endo: Denies change in weight, skin or hair change.  Heme/Lymph: No excessive bleeding, bruising or enlarged lymph nodes.  Physical Exam  BP 140/80   Pulse 72   Temp (!) 97.2 F (36.2 C)   Resp 16   Ht 6' (1.829 m)   Wt 236 lb 6.4 oz (107.2 kg)   BMI 32.06 kg/m   Appears  well nourished, well groomed  and in no distress.  Eyes: PERRLA, EOMs, conjunctiva no swelling or erythema. Sinuses: No frontal/maxillary tenderness ENT/Mouth: EAC's clear, TM's nl w/o erythema, bulging. Nares clear w/o erythema, swelling, exudates. Oropharynx clear  without erythema or exudates. Oral hygiene is good. Tongue normal, non obstructing. Hearing intact.  Neck: Supple. Thyroid not palpable. Car 2+/2+ without bruits, nodes or JVD. Chest: Respirations nl with BS clear & equal w/o rales, rhonchi, wheezing or stridor.  Cor: Heart sounds normal w/ regular rate and rhythm without sig. murmurs, gallops, clicks or rubs. Peripheral pulses normal and equal  without edema.  Abdomen: Soft & bowel sounds normal. Non-tender w/o guarding, rebound, hernias, masses or organomegaly.  Lymphatics: Unremarkable.  Musculoskeletal: Full ROM all peripheral extremities, joint stability, 5/5 strength and normal gait.  Skin: Warm, dry without exposed rashes, lesions or ecchymosis apparent.  Neuro: Cranial nerves intact, reflexes equal bilaterally. Sensory-motor testing grossly intact. Tendon reflexes grossly intact.  Pysch: Alert & oriented x 3.  Insight and judgement nl & appropriate. No ideations.  Assessment and Plan:  1. Essential hypertension  - Continue medication, monitor blood pressure at home.  - Continue DASH diet.  Reminder to go to the ER if any CP,  SOB, nausea, dizziness, severe HA, changes vision/speech.  - CBC with Differential/Platelet - COMPLETE METABOLIC PANEL WITH GFR - Magnesium - TSH  2. Hyperlipidemia, mixed  - Continue diet/meds, exercise,& lifestyle modifications.  - Continue monitor periodic cholesterol/liver & renal functions   - Lipid panel - TSH  3. Type 2 diabetes mellitus with stage 2 chronic kidney disease, without long-term current use of insulin (HCC)  - Hemoglobin A1c - Insulin, random  4. Vitamin D deficiency  - Continue diet, exercise, lifestyle modifications.  - Monitor appropriate labs. - Continue supplementation.  - VITAMIN D 25 Hydroxyl  5. Chronic gout  - Uric acid  6. Hypothyroidism  - TSH  7. Medication management  - CBC with Differential/Platelet - COMPLETE METABOLIC PANEL WITH GFR -  Magnesium - Lipid panel - TSH - Hemoglobin A1c - Insulin, random - VITAMIN D 25 Hydroxyl - Uric acid       Discussed  regular exercise, BP monitoring, weight control to achieve/maintain BMI less than 25 and discussed med and SE's. Recommended labs to assess and monitor clinical status with further disposition pending results of labs. Over 30 minutes of exam, counseling, chart review was performed.

## 2017-12-22 NOTE — Patient Instructions (Signed)
Medication Instructions:  Your physician recommends that you continue on your current medications as directed. Please refer to the Current Medication list given to you today.  Labwork: NONE  Testing/Procedures: NONE  Follow-Up: Your physician wants you to follow-up in: [place]. You will receive a reminder letter in the mail two months in advance. If you don't receive a letter, please call our office to schedule the follow-up appointment.  Any Other Special Instructions Will Be Listed Below (If Applicable).     If you need a refill on your cardiac medications before your next appointment, please call your pharmacy.

## 2017-12-23 LAB — COMPLETE METABOLIC PANEL WITH GFR
AG RATIO: 1.5 (calc) (ref 1.0–2.5)
ALBUMIN MSPROF: 4.5 g/dL (ref 3.6–5.1)
ALT: 21 U/L (ref 9–46)
AST: 17 U/L (ref 10–35)
Alkaline phosphatase (APISO): 119 U/L — ABNORMAL HIGH (ref 40–115)
BUN / CREAT RATIO: 13 (calc) (ref 6–22)
BUN: 17 mg/dL (ref 7–25)
CALCIUM: 10 mg/dL (ref 8.6–10.3)
CO2: 28 mmol/L (ref 20–32)
Chloride: 106 mmol/L (ref 98–110)
Creat: 1.36 mg/dL — ABNORMAL HIGH (ref 0.70–1.25)
GFR, EST AFRICAN AMERICAN: 62 mL/min/{1.73_m2} (ref >=60)
GFR, EST NON AFRICAN AMERICAN: 53 mL/min/{1.73_m2} — AB (ref >=60)
GLOBULIN: 3.1 g/dL (ref 1.9–3.7)
Glucose, Bld: 113 mg/dL — ABNORMAL HIGH (ref 65–99)
POTASSIUM: 4.4 mmol/L (ref 3.5–5.3)
SODIUM: 145 mmol/L (ref 135–146)
TOTAL PROTEIN: 7.6 g/dL (ref 6.1–8.1)
Total Bilirubin: 0.9 mg/dL (ref 0.2–1.2)

## 2017-12-23 LAB — CBC WITH DIFFERENTIAL/PLATELET
BASOS ABS: 53 {cells}/uL (ref 0–200)
Basophils Relative: 0.7 %
EOS ABS: 83 {cells}/uL (ref 15–500)
Eosinophils Relative: 1.1 %
HEMATOCRIT: 43.6 % (ref 38.5–50.0)
HEMOGLOBIN: 15 g/dL (ref 13.2–17.1)
LYMPHS ABS: 2033 {cells}/uL (ref 850–3900)
MCH: 33.5 pg — AB (ref 27.0–33.0)
MCHC: 34.4 g/dL (ref 32.0–36.0)
MCV: 97.3 fL (ref 80.0–100.0)
MPV: 10.5 fL (ref 7.5–12.5)
Monocytes Relative: 8.7 %
NEUTROS ABS: 4680 {cells}/uL (ref 1500–7800)
NEUTROS PCT: 62.4 %
Platelets: 247 10*3/uL (ref 140–400)
RBC: 4.48 10*6/uL (ref 4.20–5.80)
RDW: 13.8 % (ref 11.0–15.0)
Total Lymphocyte: 27.1 %
WBC: 7.5 10*3/uL (ref 3.8–10.8)
WBCMIX: 653 {cells}/uL (ref 200–950)

## 2017-12-23 LAB — LIPID PANEL
CHOL/HDL RATIO: 2.9 (calc) (ref <5.0)
Cholesterol: 105 mg/dL (ref <200)
HDL: 36 mg/dL — AB (ref >40)
LDL Cholesterol (Calc): 46 mg/dL (calc)
NON-HDL CHOLESTEROL (CALC): 69 mg/dL (ref <130)
Triglycerides: 152 mg/dL — ABNORMAL HIGH (ref <150)

## 2017-12-23 LAB — HEMOGLOBIN A1C
EAG (MMOL/L): 7.7 (calc)
Hgb A1c MFr Bld: 6.5 % of total Hgb — ABNORMAL HIGH (ref <5.7)
MEAN PLASMA GLUCOSE: 140 (calc)

## 2017-12-23 LAB — VITAMIN D 25 HYDROXY (VIT D DEFICIENCY, FRACTURES): VIT D 25 HYDROXY: 85 ng/mL (ref 30–100)

## 2017-12-23 LAB — URIC ACID: URIC ACID, SERUM: 6.2 mg/dL (ref 4.0–8.0)

## 2017-12-23 LAB — MAGNESIUM: Magnesium: 1.9 mg/dL (ref 1.5–2.5)

## 2017-12-23 LAB — INSULIN, RANDOM: INSULIN: 4.3 u[IU]/mL (ref 2.0–19.6)

## 2017-12-23 LAB — TSH: TSH: 0.12 mIU/L — ABNORMAL LOW (ref 0.40–4.50)

## 2017-12-26 ENCOUNTER — Encounter: Payer: Self-pay | Admitting: Internal Medicine

## 2018-02-08 ENCOUNTER — Other Ambulatory Visit: Payer: Self-pay | Admitting: Internal Medicine

## 2018-02-08 DIAGNOSIS — E039 Hypothyroidism, unspecified: Secondary | ICD-10-CM

## 2018-02-08 DIAGNOSIS — M1A9XX0 Chronic gout, unspecified, without tophus (tophi): Secondary | ICD-10-CM

## 2018-02-08 DIAGNOSIS — I10 Essential (primary) hypertension: Secondary | ICD-10-CM

## 2018-02-08 DIAGNOSIS — I4819 Other persistent atrial fibrillation: Secondary | ICD-10-CM

## 2018-02-08 MED ORDER — LEVOTHYROXINE SODIUM 125 MCG PO TABS: ORAL_TABLET | ORAL | 3 refills | Status: DC

## 2018-02-08 MED ORDER — AMLODIPINE BESYLATE 5 MG PO TABS: ORAL_TABLET | ORAL | 3 refills | Status: DC

## 2018-02-08 MED ORDER — RIVAROXABAN 20 MG PO TABS: ORAL_TABLET | ORAL | 3 refills | Status: DC

## 2018-02-08 MED ORDER — OLMESARTAN MEDOXOMIL 40 MG PO TABS: 40.0000 mg | ORAL_TABLET | Freq: Every day | ORAL | 0 refills | Status: DC

## 2018-02-08 MED ORDER — COLCHICINE 0.6 MG PO TABS: ORAL_TABLET | ORAL | 3 refills | Status: DC

## 2018-03-17 ENCOUNTER — Encounter: Payer: Self-pay | Admitting: Cardiology

## 2018-03-17 ENCOUNTER — Ambulatory Visit (INDEPENDENT_AMBULATORY_CARE_PROVIDER_SITE_OTHER): Payer: Medicare Other | Admitting: Cardiology

## 2018-03-17 VITALS — BP 152/84 | HR 61 | Ht 72.0 in | Wt 240.0 lb

## 2018-03-17 DIAGNOSIS — I4819 Other persistent atrial fibrillation: Secondary | ICD-10-CM

## 2018-03-17 DIAGNOSIS — I10 Essential (primary) hypertension: Secondary | ICD-10-CM | POA: Diagnosis not present

## 2018-03-17 DIAGNOSIS — N182 Chronic kidney disease, stage 2 (mild): Secondary | ICD-10-CM

## 2018-03-17 DIAGNOSIS — E7800 Pure hypercholesterolemia, unspecified: Secondary | ICD-10-CM

## 2018-03-17 DIAGNOSIS — E1122 Type 2 diabetes mellitus with diabetic chronic kidney disease: Secondary | ICD-10-CM | POA: Diagnosis not present

## 2018-03-17 DIAGNOSIS — I6523 Occlusion and stenosis of bilateral carotid arteries: Secondary | ICD-10-CM | POA: Diagnosis not present

## 2018-03-17 DIAGNOSIS — I2510 Atherosclerotic heart disease of native coronary artery without angina pectoris: Secondary | ICD-10-CM | POA: Diagnosis not present

## 2018-03-17 NOTE — Patient Instructions (Signed)
Medication Instructions:  Your physician recommends that you continue on your current medications as directed. Please refer to the Current Medication list given to you today.  If you need a refill on your cardiac medications before your next appointment, please call your pharmacy.   Lab work:  If you have labs (blood work) drawn today and your tests are completely normal, you will receive your results only by: [name] MyChart Message (if you have MyChart) OR . A paper copy in the mail If you have any lab test that is abnormal or we need to change your treatment, we will call you to review the results.  Follow-Up: At [facility], you and your health needs are our priority.  As part of our continuing mission to provide you with exceptional heart care, we have created designated Provider Care Teams.  These Care Teams include your primary Cardiologist (physician) and Advanced Practice Providers (APPs -  Physician Assistants and Nurse Practitioners) who all work together to provide you with the care you need, when you need it.  Your physician wants you to follow-up in: 6 months with PA. Please call our office to schedule the follow-up appointment.   You will need a follow up appointment in 1  years.  Please call our office 2 months in advance to schedule this appointment.  You may see Fransico Him, MD or one of the following Advanced Practice Providers on your designated Care Team:   [place], PA-C Melina Copa, PA-C . Ermalinda Barrios, PA-C

## 2018-03-17 NOTE — Progress Notes (Addendum)
Cardiology Office Note:    Date:  03/17/2018   ID:  Christopher Wade, DOB May 08, 1951, MRN [id number]  PCP:  [name], MD  Cardiologist:  Fransico Him, MD    Referring MD: [name], MD   Chief Complaint  Patient presents with  . Atrial Fibrillation  . Hypertension  . Coronary Artery Disease  . Hyperlipidemia    History of Present Illness:    Christopher Wade is a 67 y.o. male with a hx of persistentatrial fibrillation s/p DCCV11/2016 to NSR but before discharge he went back into afib.He was loaded with Amio and then he was set up to have DCCV which he successfully had done in July 2017. He ultimately underwent afib ablation 06/11/2016. He was found to have early lung toxicity and his Amio was stopped.He also has a history of HTN, dyslipidemia, DM, ASCAD cath showing 50% LM, 40% distal LAD, 70% ost to prox LCX, 40% mid LAD and 95% mid to dist RCA s/p PCI with DES x 2 and stage PCI of the LCx/OM.   He is here today for followup and is doing well.  He denies any chest pain or pressure, SOB, DOE, PND, orthopnea, LE edema, dizziness, palpitations or syncope. He is compliant with his meds and is tolerating meds with no SE.    Past Medical History:  Diagnosis Date  . Arthritis    "joints get stiff" (06/11/2016)  . Bee sting allergy   . Bee sting allergy   . CAD (coronary artery disease), native coronary artery 02/12/2016   cath showing 50% LM, 40% distal LAD, 70% ost to prox LCs, 40% mid LAD and 95% mid to dist RCA s/p PCI with DES x 2 and stage PCI of the LCx/OM.  [name] Carotid artery stenosis    1-39% bilateral by dopplers 08/2017  . History of gout   . Hx of adenomatous colonic polyps 04/10/2008  . Hyperlipidemia   . Hypertension   . Hypothyroidism   . OSA (obstructive sleep apnea)    "tried CPAP; couldn't do it" (06/11/2016)  . Persistent atrial fibrillation    s/p DCCV 03/2015 with reversion back to atrial fibrillation, loaded with Amio and s/p DCCV to NSR 11/2015.   He is now s/p afib albation.  He had amio lung toxicity and amio was stopped.  . Thyroid disease   . Type II diabetes mellitus ([place])   . Vitamin D deficiency     Past Surgical History:  Procedure Laterality Date  . CARDIAC CATHETERIZATION N/A 10/11/2015   Procedure: Left Heart Cath and Coronary Angiography;  Surgeon: [name], MD;  Location: [place] [facility];  Service: Cardiovascular;  Laterality: N/A;  . CARDIAC CATHETERIZATION N/A 10/11/2015   Procedure: Coronary Stent Intervention;  Surgeon: [name], MD;  Location: [place] [facility];  Service: Cardiovascular;  Laterality: N/A;  . CARDIAC CATHETERIZATION N/A 10/11/2015   Procedure: Intravascular Pressure Wire/FFR Study;  Surgeon: [name], MD;  Location: [place] [facility];  Service: Cardiovascular;  Laterality: N/A;  . CARDIAC CATHETERIZATION N/A 10/14/2015   Procedure: Coronary Stent Intervention;  Surgeon: [name], MD;  Location: [place] [facility];  Service: Cardiovascular;  Laterality: N/A;  . CARDIOVERSION N/A 04/05/2015   Procedure: CARDIOVERSION;  Surgeon: [name], MD;  Location: [facility];  Service: Cardiovascular;  Laterality: N/A;  . CARDIOVERSION N/A 12/09/2015   Procedure: CARDIOVERSION;  Surgeon: [name], MD;  Location: [place];  Service: Cardiovascular;  Laterality:  N/A;  . CARDIOVERSION N/A 03/16/2016   Procedure: CARDIOVERSION;  Surgeon: [name], MD;  Location: [place];  Service: Cardiovascular;  Laterality: N/A;  . CORONARY ANGIOPLASTY    . ELECTROPHYSIOLOGIC STUDY N/A 06/11/2016   Procedure: Atrial Fibrillation Ablation;  Surgeon: Will [name], MD;  Location: [place] [facility];  Service: Cardiovascular;  Laterality: N/A;  . JOINT REPLACEMENT    . TOTAL HIP ARTHROPLASTY Bilateral 2001  . UVULECTOMY      Current Medications: Current Meds  Medication Sig  . allopurinol (ZYLOPRIM) 300 MG tablet Take 150 mg by mouth daily.  [name] amLODipine (NORVASC)  5 MG tablet Take 1.5 tablets by mouth daily  . atorvastatin (LIPITOR) 80 MG tablet take 1 tablet by mouth once daily AT 6PM for cholesterol  . B Complex Vitamins (B COMPLEX PO) Take 1 tablet by mouth daily.  . carvedilol (COREG) 12.5 MG tablet Take 1 tablet (12.5 mg total) by mouth 2 (two) times daily.  . cholecalciferol (VITAMIN D) 1000 UNITS tablet Take 2,000 Units by mouth daily.  . colchicine 0.6 MG tablet TAKE 1 TABLET BY MOUTH 1  TIME DAILY TO PREVENT GOUT  . levothyroxine (SYNTHROID, LEVOTHROID) 125 MCG tablet Take 125 mcg by mouth as directed. 1/2 tablet Tuesday, Thursday, Saturday, Sunday and 1 whole tablet Monday, Wednesday, and Friday  . loratadine (CLARITIN) 10 MG tablet Take 10 mg by mouth daily.  . magnesium gluconate (MAGONATE) 500 MG tablet Take 1,000 mg by mouth daily.   . metFORMIN (GLUCOPHAGE) 500 MG tablet Take 500 mg by mouth.   . nystatin cream (MYCOSTATIN) Apply 1 application topically 2 (two) times daily as needed (wound care).  [name] olmesartan (BENICAR) 40 MG tablet Take 1 tablet (40 mg total) by mouth daily.  . rivaroxaban (XARELTO) 20 MG TABS tablet take 1 tablet by mouth once daily WITH SUPPER   Current Facility-Administered Medications for the 03/17/18 encounter (Office Visit) with [name], MD  Medication  . 0.9 %  sodium chloride infusion     Allergies:   Amiodarone and Bee venom   Social History   Socioeconomic History  . Marital status: Single    Spouse name: Not on file  . Number of children: Not on file  . Years of education: Not on file  . Highest education level: Not on file  Occupational History  . Not on file  Social Needs  . Financial resource strain: Not on file  . Food insecurity:    Worry: Not on file    Inability: Not on file  . Transportation needs:    Medical: Not on file    Non-medical: Not on file  Tobacco Use  . Smoking status: Never Smoker  . Smokeless tobacco: Never Used  Substance and Sexual Activity  . Alcohol use: No   . Drug use: No  . Sexual activity: Not Currently  Lifestyle  . Physical activity:    Days per week: Not on file    Minutes per session: Not on file  . Stress: Not on file  Relationships  . Social connections:    Talks on phone: Not on file    Gets together: Not on file    Attends religious service: Not on file    Active member of club or organization: Not on file    Attends meetings of clubs or organizations: Not on file    Relationship status: Not on file  Other Topics Concern  . Not on file  Social History Narrative  He is single, he has been in [employer] for his whole life.  Currently struggling to find much work.09/28/2017   Denies alcohol tobacco or drug use     Family History: The patient's family history includes Colon cancer in his sister; Lung cancer in his father and mother.  ROS:   Please see the history of present illness.    ROS  All other systems reviewed and negative.   EKGs/Labs/Other Studies Reviewed:    The following studies were reviewed today: none  EKG:  EKG is not ordered today.    Recent Labs: 12/22/2017: ALT 21; BUN 17; Creat 1.36; Hemoglobin 15.0; Magnesium 1.9; Platelets 247; Potassium 4.4; Sodium 145; TSH 0.12   Recent Lipid Panel    Component Value Date/Time   CHOL 105 12/22/2017 1619   TRIG 152 (H) 12/22/2017 1619   HDL 36 (L) 12/22/2017 1619   CHOLHDL 2.9 12/22/2017 1619   VLDL 19 11/30/2016 1609   LDLCALC 46 12/22/2017 1619    Physical Exam:    VS:  BP (!) 152/84   Pulse 61   Ht 6' (1.829 m)   Wt 240 lb (108.9 kg)   SpO2 98%   BMI 32.55 kg/m     Wt Readings from Last 3 Encounters:  03/17/18 240 lb (108.9 kg)  12/22/17 236 lb 6.4 oz (107.2 kg)  12/22/17 237 lb 6.4 oz (107.7 kg)     GEN:  Well nourished, well developed in no acute distress HEENT: Normal NECK: No JVD; No carotid bruits LYMPHATICS: No lymphadenopathy CARDIAC: RRR, no murmurs, rubs, gallops RESPIRATORY:  Clear to auscultation without rales, wheezing  or rhonchi  ABDOMEN: Soft, non-tender, non-distended MUSCULOSKELETAL:  No edema; No deformity  SKIN: Warm and dry NEUROLOGIC:  Alert and oriented x 3 PSYCHIATRIC:  Normal affect   ASSESSMENT:    1. Coronary artery disease involving native coronary artery of native heart without angina pectoris   2. Essential hypertension   3. Persistent atrial fibrillation   4. Bilateral carotid artery stenosis   5. Pure hypercholesterolemia   6. Type 2 diabetes mellitus with stage 2 chronic kidney disease, without long-term current use of insulin (HCC)    PLAN:    In order of problems listed above:  1.  ASCAD - cath 2017 showed 50% LM, 40% distal LAD, 70% ost to prox LCX, 40% mid LAD and 95% mid to dist RCA s/p PCI with DES x 2 and stage PCI of the LCx/OM.  He has not had any further anginal symptoms.  He will continue on high dose statin therapy and beta-blocker.  He is not on aspirin due to DOAC.  2.  HTN -BP is borderline controlled on exam today.  He says his blood pressure this morning was 126/55 mmHg.  He will continue on Benicar 40 mg daily, carvedilol 12.5 mg twice daily and amlodipine 7.5 mg daily.  His creatinine was stable at 1.36 and potassium 4.4 last summer.  3.  Persistent atrial fibrillation -he is status post [name] ablation in 2018 and amiodarone was stopped due to possible lung toxicity.  He has maintain normal sinus rhythm on exam today.  He will continue on Xarelto 20 mg daily as well as carvedilol 12.5 mg twice daily.  Hemoglobin was stable at 15 on 12/23/2017.  4.  Bilateral carotid artery stenosis -Dopplers 09/16/2017 showed 1 to 39% bilateral stenosis.  He will continue on statin and aspirin therapy.  5.  Hyperlipidemia -LDL goal is less than 70.  His  last LDL was 46 on 12/23/2017 with a normal ALT at 21.  He will continue on atorvastatin 80 mg daily.  6.  Chronic kidney disease stage III -his last creatinine was stable at 1.36 and this was followed by his PCP.   Medication  Adjustments/Labs and Tests Ordered: Current medicines are reviewed at length with the patient today.  Concerns regarding medicines are outlined above.  No orders of the defined types were placed in this encounter.  No orders of the defined types were placed in this encounter.   Signed, Fransico Him, MD  03/17/2018 2:42 PM    [facility]

## 2018-03-25 ENCOUNTER — Other Ambulatory Visit: Payer: Self-pay | Admitting: Internal Medicine

## 2018-03-25 ENCOUNTER — Other Ambulatory Visit: Payer: Self-pay | Admitting: Physician Assistant

## 2018-03-30 NOTE — Progress Notes (Signed)
FOLLOW UP  Assessment and Plan:   Afib rate controlled Doing well s/p ablation off AAD therapy Continue Xarelto for CHADS2VASC of 4 Continue following up with cardio still unable to tolerate CPAP due to dryness.   CAD Control blood pressure, cholesterol, glucose, increase exercise.  Followed by cardiology  Hypertension Typically well controlled, has been off of olmesartan due to running out, will restart and recheck BP in 3-4 weeks Monitor blood pressure at home; patient to call if consistently greater than 130/80 Continue DASH diet.   Reminder to go to the ER if any CP, SOB, nausea, dizziness, severe HA, changes vision/speech, left arm numbness and tingling and jaw pain.  Cholesterol Currently at LDL goal; continue statin; diet for trigs discussed Continue low cholesterol diet and exercise.  Check lipid panel.   Diabetes with diabetic chronic kidney disease Continue medication: metformin 2000 mg daily  Continue diet and exercise.  Perform daily foot/skin check, notify office of any concerning changes.  Check A1C  Morbid obesity Long discussion about weight loss, diet, and exercise Recommended diet heavy in fruits and veggies and low in animal meats, cheeses, and dairy products, appropriate calorie intake Discussed ideal weight for height, goal <225lb Patient will work on pushing vegetables, exercise, portion control Will follow up in 3 months  Vitamin D Def At goal at last visit; continue supplementation to maintain goal of 70-100 Defer Vit D level  Gout Continue allopurinol Diet discussed Check uric acid as needed  Continue diet and meds as discussed. Further disposition pending results of labs. Discussed med's effects and SE's.   Over 30 minutes of exam, counseling, chart review, and critical decision making was performed.   Future Appointments  Date Time Provider [place]  06/14/2018  3:30 PM Constance Haw, MD [facility]   07/14/2018  3:00 PM Vicie Mutters, PA-C [facility] None    ----------------------------------------------------------------------------------------------------------------------  HPI 67 y.o. male  presents for 3 month follow up on hypertension, cholesterol, diabetes, hypothyroid, morbid obesity and vitamin D deficiency. Patient has OSA but is intolerant to the mask; weight loss has been emphasized to patient without much progress. Patient has history of Afib (2016) and had CV x 2 (2016 & 2017) and then Jan 2018 had an Afib Ablation (Dr Curt Bears) and now on [place] and followed by Dr Fransico Him.   BMI is Body mass index is 32.55 kg/m., he has been working on diet and exercise, has been going on a walk most days, spending 30-40 min. His weight is down from 270 lb, he has been pushing vegetable intake.  Wt Readings from Last 3 Encounters:  03/31/18 240 lb (108.9 kg)  03/17/18 240 lb (108.9 kg)  12/22/17 236 lb 6.4 oz (107.2 kg)   His blood pressure has been controlled at home (typically 130s/70s), he has been off of olmesartan for a few days after running out, shipment should arrive today/tomorrow, today their BP is BP: (!) 150/86  He does workout. He denies chest pain, shortness of breath, dizziness.   He is on cholesterol medication Atorvastatin 80 mg daily and denies myalgias. His cholesterol is at goal. The cholesterol last visit was:   Lab Results  Component Value Date   CHOL 105 12/22/2017   HDL 36 (L) 12/22/2017   LDLCALC 46 12/22/2017   TRIG 152 (H) 12/22/2017   CHOLHDL 2.9 12/22/2017    He has been working on diet and exercise for T2 diabetes on metformin 2000 mg daily (has been out for 3-4 days,  waiting on shipment to arrive), and denies foot ulcerations, increased appetite, nausea, paresthesia of the feet, polydipsia and polyuria. He does check fasting sugars, ranging 100- 120. Last A1C in the office was:  Lab Results  Component Value Date   HGBA1C 6.5 (H) 12/22/2017   He  is on thyroid medication. His medication was changed last visit, 125 mcg 1 whole tablet 3 x / week and 1/2 tablet 4 x/ week, takes whole tabs MWF. Lab Results  Component Value Date   TSH 0.12 (L) 12/22/2017   Patient is on Vitamin D supplement.   Lab Results  Component Value Date   VD25OH 85 12/22/2017     Patient is on allopurinol for gout and does not report a recent flare.  Lab Results  Component Value Date   LABURIC 6.2 12/22/2017     Current Medications:  Current Outpatient Medications on File Prior to Visit  Medication Sig  . allopurinol (ZYLOPRIM) 300 MG tablet Take 150 mg by mouth daily.  [name] allopurinol (ZYLOPRIM) 300 MG tablet TAKE 1 TABLET BY MOUTH  DAILY  . amLODipine (NORVASC) 5 MG tablet Take 1.5 tablets by mouth daily  . atorvastatin (LIPITOR) 80 MG tablet TAKE 1 TABLET BY MOUTH ONCE DAILY AT 6PM FOR  CHOLESTEROL  . B Complex Vitamins (B COMPLEX PO) Take 1 tablet by mouth daily.  . carvedilol (COREG) 12.5 MG tablet Take 1 tablet (12.5 mg total) by mouth 2 (two) times daily.  . cholecalciferol (VITAMIN D) 1000 UNITS tablet Take 2,000 Units by mouth daily.  . colchicine 0.6 MG tablet TAKE 1 TABLET BY MOUTH 1  TIME DAILY TO PREVENT GOUT  . levothyroxine (SYNTHROID, LEVOTHROID) 125 MCG tablet Take 125 mcg by mouth as directed. 1/2 tablet Tuesday, Thursday, Saturday, Sunday and 1 whole tablet Monday, Wednesday, and Friday  . loratadine (CLARITIN) 10 MG tablet Take 10 mg by mouth daily.  . magnesium gluconate (MAGONATE) 500 MG tablet Take 1,000 mg by mouth daily.   . metFORMIN (GLUCOPHAGE) 500 MG tablet Take 500 mg by mouth.   . metFORMIN (GLUCOPHAGE-XR) 500 MG 24 hr tablet TAKE 2 TABLETS BY MOUTH TWO TIMES DAILY FOR DIABETES  . nystatin cream (MYCOSTATIN) Apply 1 application topically 2 (two) times daily as needed (wound care).  [name] olmesartan (BENICAR) 40 MG tablet Take 1 tablet (40 mg total) by mouth daily.  . rivaroxaban (XARELTO) 20 MG TABS tablet take 1 tablet by mouth  once daily WITH SUPPER   Current Facility-Administered Medications on File Prior to Visit  Medication  . 0.9 %  sodium chloride infusion     Allergies:  Allergies  Allergen Reactions  . Amiodarone Other (See Comments)    Lung toxicity  . Bee Venom Other (See Comments)    Leg went numb     Medical History:  Past Medical History:  Diagnosis Date  . Arthritis    "joints get stiff" (06/11/2016)  . Bee sting allergy   . Bee sting allergy   . CAD (coronary artery disease), native coronary artery 02/12/2016   cath showing 50% LM, 40% distal LAD, 70% ost to prox LCs, 40% mid LAD and 95% mid to dist RCA s/p PCI with DES x 2 and stage PCI of the LCx/OM.  [name] Carotid artery stenosis    1-39% bilateral by dopplers 08/2017  . History of gout   . Hx of adenomatous colonic polyps 04/10/2008  . Hyperlipidemia   . Hypertension   . Hypothyroidism   . OSA (obstructive  sleep apnea)    "tried CPAP; couldn't do it" (06/11/2016)  . Persistent atrial fibrillation    s/p DCCV 03/2015 with reversion back to atrial fibrillation, loaded with Amio and s/p DCCV to NSR 11/2015.  He is now s/p afib albation.  He had amio lung toxicity and amio was stopped.  . Thyroid disease   . Type II diabetes mellitus ([place])   . Vitamin D deficiency    Family history- Reviewed and unchanged Social history- Reviewed and unchanged   Review of Systems:  Review of Systems  Constitutional: Negative for malaise/fatigue and weight loss.  HENT: Negative for hearing loss and tinnitus.   Eyes: Negative for blurred vision and double vision.  Respiratory: Negative for cough, shortness of breath and wheezing.   Cardiovascular: Negative for chest pain, palpitations, orthopnea, claudication and leg swelling.  Gastrointestinal: Negative for abdominal pain, blood in stool, constipation, diarrhea, heartburn, melena, nausea and vomiting.  Genitourinary: Negative.   Musculoskeletal: Negative for joint pain and myalgias.  Skin:  Negative for rash.  Neurological: Negative for dizziness, tingling, sensory change, weakness and headaches.  Endo/Heme/Allergies: Negative for polydipsia.  Psychiatric/Behavioral: Negative.   All other systems reviewed and are negative.     Physical Exam: BP (!) 150/86   Pulse 63   Temp (!) 97.5 F (36.4 C)   Ht 6' (1.829 m)   Wt 240 lb (108.9 kg)   SpO2 95%   BMI 32.55 kg/m  Wt Readings from Last 3 Encounters:  03/31/18 240 lb (108.9 kg)  03/17/18 240 lb (108.9 kg)  12/22/17 236 lb 6.4 oz (107.2 kg)   General Appearance: Well nourished, in no apparent distress. Eyes: PERRLA, EOMs, conjunctiva no swelling or erythema Sinuses: No Frontal/maxillary tenderness ENT/Mouth: Ext aud canals clear, TMs without erythema, bulging. No erythema, swelling, or exudate on post pharynx.  Tonsils not swollen or erythematous. Hearing normal.  Neck: Supple, thyroid normal.  Respiratory: Respiratory effort normal, BS equal bilaterally without rales, rhonchi, wheezing or stridor.  Cardio: RRR with no MRGs. Brisk peripheral pulses without edema.  Abdomen: Soft, + BS.  Non tender, no guarding, rebound, hernias, masses. Lymphatics: Non tender without lymphadenopathy.  Musculoskeletal: Full ROM, 5/5 strength, Not tested gait Skin: Warm, dry without rashes, lesions, ecchymosis.  Neuro: Cranial nerves intact. No cerebellar symptoms.  Psych: Awake and oriented X 3, normal affect, Insight and Judgment appropriate.    Izora Ribas, NP 4:11 PM [facility]

## 2018-03-31 ENCOUNTER — Encounter: Payer: Self-pay | Admitting: Adult Health

## 2018-03-31 ENCOUNTER — Ambulatory Visit (INDEPENDENT_AMBULATORY_CARE_PROVIDER_SITE_OTHER): Payer: Medicare Other | Admitting: Adult Health

## 2018-03-31 VITALS — BP 150/86 | HR 63 | Temp 97.5°F | Ht 72.0 in | Wt 240.0 lb

## 2018-03-31 DIAGNOSIS — I10 Essential (primary) hypertension: Secondary | ICD-10-CM | POA: Diagnosis not present

## 2018-03-31 DIAGNOSIS — E1169 Type 2 diabetes mellitus with other specified complication: Secondary | ICD-10-CM | POA: Diagnosis not present

## 2018-03-31 DIAGNOSIS — E785 Hyperlipidemia, unspecified: Secondary | ICD-10-CM | POA: Diagnosis not present

## 2018-03-31 DIAGNOSIS — N182 Chronic kidney disease, stage 2 (mild): Secondary | ICD-10-CM | POA: Diagnosis not present

## 2018-03-31 DIAGNOSIS — E66811 Obesity, class 1: Secondary | ICD-10-CM

## 2018-03-31 DIAGNOSIS — E559 Vitamin D deficiency, unspecified: Secondary | ICD-10-CM

## 2018-03-31 DIAGNOSIS — E039 Hypothyroidism, unspecified: Secondary | ICD-10-CM | POA: Diagnosis not present

## 2018-03-31 DIAGNOSIS — E1122 Type 2 diabetes mellitus with diabetic chronic kidney disease: Secondary | ICD-10-CM | POA: Diagnosis not present

## 2018-03-31 DIAGNOSIS — I4819 Other persistent atrial fibrillation: Secondary | ICD-10-CM | POA: Diagnosis not present

## 2018-03-31 DIAGNOSIS — I2510 Atherosclerotic heart disease of native coronary artery without angina pectoris: Secondary | ICD-10-CM

## 2018-03-31 DIAGNOSIS — Z79899 Other long term (current) drug therapy: Secondary | ICD-10-CM | POA: Diagnosis not present

## 2018-03-31 DIAGNOSIS — E669 Obesity, unspecified: Secondary | ICD-10-CM | POA: Diagnosis not present

## 2018-03-31 DIAGNOSIS — I6523 Occlusion and stenosis of bilateral carotid arteries: Secondary | ICD-10-CM | POA: Diagnosis not present

## 2018-03-31 NOTE — Patient Instructions (Addendum)
Goals    . Blood Pressure < 130/80    . Weight (lb) < 225 lb (102.1 kg)      Know what a healthy weight is for you (roughly BMI <25) and aim to maintain this  Aim for 7+ servings of fruits and vegetables daily  65-80+ fluid ounces of water or unsweet tea for healthy kidneys  Limit to max 1 drink of alcohol per day; avoid smoking/tobacco  Limit animal fats in diet for cholesterol and heart health - choose grass fed whenever available  Avoid highly processed foods, and foods high in saturated/trans fats  Aim for low stress - take time to unwind and care for your mental health  Aim for 150 min of moderate intensity exercise weekly for heart health, and weights twice weekly for bone health  Aim for 7-9 hours of sleep daily    HYPERTENSION INFORMATION  Monitor your blood pressure at home, please keep a record and bring that in with you to your next office visit.   Go to the ER if any CP, SOB, nausea, dizziness, severe HA, changes vision/speech  Due to a recent study, SPRINT, we have changed our goal for the systolic or top blood pressure number. Ideally we want your top number at 120.  In the [facility] Trial, 5000 people were randomized to a goal BP of 120 and 5000 people were randomized to a goal BP of less than 140. The patients with the goal BP at 120 had LESS DEMENTIA, LESS HEART ATTACKS, AND LESS STROKES, AS WELL AS OVERALL DECREASED MORTALITY OR DEATH RATE.   Your most recent BP: BP: (!) 150/86   Take your medications faithfully as instructed. Maintain a healthy weight. Get at least 150 minutes of aerobic exercise per week. Minimize salt intake. Minimize alcohol intake  DASH Eating Plan DASH stands for "Dietary Approaches to Stop Hypertension." The DASH eating plan is a healthy eating plan that has been shown to reduce high blood pressure (hypertension). Additional health benefits may include reducing the risk of type 2 diabetes mellitus, heart disease, and stroke. The DASH  eating plan may also help with weight loss. WHAT DO I NEED TO KNOW ABOUT THE DASH EATING PLAN? For the DASH eating plan, you will follow these general guidelines:  Choose foods with a percent daily value for sodium of less than 5% (as listed on the food label).  Use salt-free seasonings or herbs instead of table salt or sea salt.  Check with your health care provider or pharmacist before using salt substitutes.  Eat lower-sodium products, often labeled as "lower sodium" or "no salt added."  Eat fresh foods.  Eat more vegetables, fruits, and low-fat dairy products.  Choose whole grains. Look for the word "whole" as the first word in the ingredient list.  Choose fish and skinless chicken or [place] more often than red meat. Limit fish, poultry, and meat to 6 oz (170 g) each day.  Limit sweets, desserts, sugars, and sugary drinks.  Choose heart-healthy fats.  Limit cheese to 1 oz (28 g) per day.  Eat more home-cooked food and less restaurant, buffet, and fast food.  Limit fried foods.  Cook foods using methods other than frying.  Limit canned vegetables. If you do use them, rinse them well to decrease the sodium.  When eating at a restaurant, ask that your food be prepared with less salt, or no salt if possible. WHAT FOODS CAN I EAT? Seek help from a dietitian for individual calorie needs.  Grains Whole grain or whole wheat bread. Brown rice. Whole grain or whole wheat pasta. Quinoa, bulgur, and whole grain cereals. Low-sodium cereals. Corn or whole wheat flour tortillas. Whole grain cornbread. Whole grain crackers. Low-sodium crackers. Vegetables Fresh or frozen vegetables (raw, steamed, roasted, or grilled). Low-sodium or reduced-sodium tomato and vegetable juices. Low-sodium or reduced-sodium tomato sauce and paste. Low-sodium or reduced-sodium canned vegetables.  Fruits All fresh, canned (in natural juice), or frozen fruits. Meat and Other Protein Products Ground beef (85%  or leaner), grass-fed beef, or beef trimmed of fat. Skinless chicken or [place]. Ground chicken or [place]. Pork trimmed of fat. All fish and seafood. Eggs. Dried beans, peas, or lentils. Unsalted nuts and seeds. Unsalted canned beans. Dairy Low-fat dairy products, such as skim or 1% milk, 2% or reduced-fat cheeses, low-fat ricotta or cottage cheese, or plain low-fat yogurt. Low-sodium or reduced-sodium cheeses. Fats and Oils Tub margarines without trans fats. Light or reduced-fat mayonnaise and salad dressings (reduced sodium). Avocado. Safflower, olive, or canola oils. Natural peanut or almond butter. Other Unsalted popcorn and pretzels. The items listed above may not be a complete list of recommended foods or beverages. Contact your dietitian for more options. WHAT FOODS ARE NOT RECOMMENDED? Grains White bread. White pasta. White rice. Refined cornbread. Bagels and croissants. Crackers that contain trans fat. Vegetables Creamed or fried vegetables. Vegetables in a cheese sauce. Regular canned vegetables. Regular canned tomato sauce and paste. Regular tomato and vegetable juices. Fruits Dried fruits. Canned fruit in light or heavy syrup. Fruit juice. Meat and Other Protein Products Fatty cuts of meat. Ribs, chicken wings, bacon, sausage, bologna, salami, chitterlings, fatback, hot dogs, bratwurst, and packaged luncheon meats. Salted nuts and seeds. Canned beans with salt. Dairy Whole or 2% milk, cream, half-and-half, and cream cheese. Whole-fat or sweetened yogurt. Full-fat cheeses or blue cheese. Nondairy creamers and whipped toppings. Processed cheese, cheese spreads, or cheese curds. Condiments Onion and garlic salt, seasoned salt, table salt, and sea salt. Canned and packaged gravies. Worcestershire sauce. Tartar sauce. Barbecue sauce. Teriyaki sauce. Soy sauce, including reduced sodium. Steak sauce. Fish sauce. Oyster sauce. Cocktail sauce. Horseradish. Ketchup and mustard. Meat flavorings  and tenderizers. Bouillon cubes. Hot sauce. Tabasco sauce. Marinades. Taco seasonings. Relishes. Fats and Oils Butter, stick margarine, lard, shortening, ghee, and bacon fat. Coconut, palm kernel, or palm oils. Regular salad dressings. Other Pickles and olives. Salted popcorn and pretzels. The items listed above may not be a complete list of foods and beverages to avoid. Contact your dietitian for more information. WHERE CAN I FIND MORE INFORMATION? National Heart, Lung, and Blood Institute: travelstabloid.com Document Released: 04/30/2011 Document Revised: 09/25/2013 Document Reviewed: 03/15/2013 [facility] Patient Information 2015 [place], [place]. This information is not intended to replace advice given to you by your health care provider. Make sure you discuss any questions you have with your health care provider.

## 2018-04-01 LAB — CBC WITH DIFFERENTIAL/PLATELET
BASOS ABS: 38 {cells}/uL (ref 0–200)
Basophils Relative: 0.5 %
Eosinophils Absolute: 128 cells/uL (ref 15–500)
Eosinophils Relative: 1.7 %
HEMATOCRIT: 46.4 % (ref 38.5–50.0)
Hemoglobin: 15.8 g/dL (ref 13.2–17.1)
Lymphs Abs: 1853 cells/uL (ref 850–3900)
MCH: 32.9 pg (ref 27.0–33.0)
MCHC: 34.1 g/dL (ref 32.0–36.0)
MCV: 96.7 fL (ref 80.0–100.0)
MPV: 10.6 fL (ref 7.5–12.5)
Monocytes Relative: 9.5 %
NEUTROS PCT: 63.6 %
Neutro Abs: 4770 cells/uL (ref 1500–7800)
PLATELETS: 247 10*3/uL (ref 140–400)
RBC: 4.8 10*6/uL (ref 4.20–5.80)
RDW: 13.6 % (ref 11.0–15.0)
TOTAL LYMPHOCYTE: 24.7 %
WBC: 7.5 10*3/uL (ref 3.8–10.8)
WBCMIX: 713 {cells}/uL (ref 200–950)

## 2018-04-01 LAB — COMPLETE METABOLIC PANEL WITH GFR
AG RATIO: 1.4 (calc) (ref 1.0–2.5)
ALBUMIN MSPROF: 4.4 g/dL (ref 3.6–5.1)
ALT: 37 U/L (ref 9–46)
AST: 33 U/L (ref 10–35)
Alkaline phosphatase (APISO): 108 U/L (ref 40–115)
BUN: 15 mg/dL (ref 7–25)
CALCIUM: 9.9 mg/dL (ref 8.6–10.3)
CO2: 29 mmol/L (ref 20–32)
Chloride: 100 mmol/L (ref 98–110)
Creat: 1.19 mg/dL (ref 0.70–1.25)
GFR, EST AFRICAN AMERICAN: 73 mL/min/{1.73_m2} (ref >=60)
GFR, EST NON AFRICAN AMERICAN: 63 mL/min/{1.73_m2} (ref >=60)
Globulin: 3.2 g/dL (calc) (ref 1.9–3.7)
Glucose, Bld: 120 mg/dL — ABNORMAL HIGH (ref 65–99)
POTASSIUM: 3.8 mmol/L (ref 3.5–5.3)
Sodium: 140 mmol/L (ref 135–146)
TOTAL PROTEIN: 7.6 g/dL (ref 6.1–8.1)
Total Bilirubin: 1.2 mg/dL (ref 0.2–1.2)

## 2018-04-01 LAB — LIPID PANEL
Cholesterol: 144 mg/dL (ref <200)
HDL: 50 mg/dL (ref >40)
LDL Cholesterol (Calc): 71 mg/dL (calc)
NON-HDL CHOLESTEROL (CALC): 94 mg/dL (ref <130)
Total CHOL/HDL Ratio: 2.9 (calc) (ref <5.0)
Triglycerides: 142 mg/dL (ref <150)

## 2018-04-01 LAB — TSH: TSH: 2.23 m[IU]/L (ref 0.40–4.50)

## 2018-04-01 LAB — HEMOGLOBIN A1C
EAG (MMOL/L): 8.2 (calc)
Hgb A1c MFr Bld: 6.8 % of total Hgb — ABNORMAL HIGH (ref <5.7)
MEAN PLASMA GLUCOSE: 148 (calc)

## 2018-04-01 LAB — MAGNESIUM: Magnesium: 2.3 mg/dL (ref 1.5–2.5)

## 2018-04-25 ENCOUNTER — Ambulatory Visit (INDEPENDENT_AMBULATORY_CARE_PROVIDER_SITE_OTHER): Payer: Medicare Other

## 2018-04-25 VITALS — BP 126/68 | Ht 72.0 in | Wt 247.6 lb

## 2018-04-25 DIAGNOSIS — I10 Essential (primary) hypertension: Secondary | ICD-10-CM | POA: Diagnosis not present

## 2018-04-25 NOTE — Progress Notes (Signed)
Pt reports for BLOOD PRESSURE CHECK Taken in   LEFT         arm Vital entered into chart. All questions were answered & check out slip was given to pt for check out.   Pt reports that he was to return to the office & have BLD pressure checked due to him being w/o BLD pressure meds for awhile. He is back on BLD pressure meds and is taking meds or as directed per pt.  Patient reports taking B/P at home & states his number have been in the .......975- Systolic & ...Marland KitchenMarland Kitchen60-Diastolic area.

## 2018-05-30 ENCOUNTER — Other Ambulatory Visit: Payer: Self-pay | Admitting: *Deleted

## 2018-05-30 DIAGNOSIS — M1A9XX0 Chronic gout, unspecified, without tophus (tophi): Secondary | ICD-10-CM

## 2018-05-30 DIAGNOSIS — I10 Essential (primary) hypertension: Secondary | ICD-10-CM

## 2018-05-30 MED ORDER — ALLOPURINOL 300 MG PO TABS: 300.0000 mg | ORAL_TABLET | Freq: Every day | ORAL | 3 refills | Status: DC

## 2018-05-30 MED ORDER — AMLODIPINE BESYLATE 5 MG PO TABS: ORAL_TABLET | ORAL | 3 refills | Status: DC

## 2018-05-30 MED ORDER — COLCHICINE 0.6 MG PO TABS: ORAL_TABLET | ORAL | 3 refills | Status: DC

## 2018-05-31 ENCOUNTER — Other Ambulatory Visit: Payer: Self-pay

## 2018-05-31 DIAGNOSIS — I4819 Other persistent atrial fibrillation: Secondary | ICD-10-CM

## 2018-05-31 MED ORDER — RIVAROXABAN 20 MG PO TABS: ORAL_TABLET | ORAL | 3 refills | Status: DC

## 2018-05-31 NOTE — Telephone Encounter (Signed)
Xarelto refill request.  

## 2018-06-02 ENCOUNTER — Other Ambulatory Visit: Payer: Self-pay | Admitting: *Deleted

## 2018-06-02 ENCOUNTER — Encounter: Payer: Self-pay | Admitting: *Deleted

## 2018-06-14 ENCOUNTER — Ambulatory Visit (INDEPENDENT_AMBULATORY_CARE_PROVIDER_SITE_OTHER): Payer: Medicare Other | Admitting: Cardiology

## 2018-06-14 ENCOUNTER — Encounter: Payer: Self-pay | Admitting: Cardiology

## 2018-06-14 VITALS — BP 142/94 | HR 60 | Ht 72.0 in | Wt 243.0 lb

## 2018-06-14 DIAGNOSIS — E785 Hyperlipidemia, unspecified: Secondary | ICD-10-CM

## 2018-06-14 DIAGNOSIS — I2510 Atherosclerotic heart disease of native coronary artery without angina pectoris: Secondary | ICD-10-CM | POA: Diagnosis not present

## 2018-06-14 DIAGNOSIS — I10 Essential (primary) hypertension: Secondary | ICD-10-CM

## 2018-06-14 DIAGNOSIS — G4733 Obstructive sleep apnea (adult) (pediatric): Secondary | ICD-10-CM

## 2018-06-14 DIAGNOSIS — I483 Typical atrial flutter: Secondary | ICD-10-CM | POA: Diagnosis not present

## 2018-06-14 DIAGNOSIS — I4819 Other persistent atrial fibrillation: Secondary | ICD-10-CM | POA: Diagnosis not present

## 2018-06-14 NOTE — Progress Notes (Signed)
Electrophysiology Office Note   Date:  06/14/2018   ID:  Rafeal, Skibicki [birth year], MRN [id number]  PCP:  [name], MD  Cardiologist:  Radford Pax Primary Electrophysiologist:  Will [name], MD    No chief complaint on file.    History of Present Illness: Christopher Wade is a 68 y.o. male who presents today for electrophysiology evaluation.   He has a history of atrial fibrillation s/p DCCV 03/2015 to NSR but before discharge he went back into afib. He also has a history of HTN, dyslipidemia, DM, ASCAD cath showing 50% LM, 40% distal LAD, 70% ost to prox LCs, 40% mid LAD and 95% mid to dist RCA s/p PCI with DES x 2 and stage PCI of the LCx/OM. At last OV he was loaded with Amio and then he was set up to have DCCV which he successfully had done in July 2017. Repeat cardioversion 03/16/16. AF ablation 06/11/16.  Today, denies symptoms of palpitations, chest pain, shortness of breath, orthopnea, PND, lower extremity edema, claudication, dizziness, presyncope, syncope, bleeding, or neurologic sequela. The patient is tolerating medications without difficulties.  Overall he is feeling well.  He has no chest pain or shortness of breath.  He is noted no further episodes of atrial fibrillation since his ablation.  He is concerned about the cost of his Xarelto.  Past Medical History:  Diagnosis Date  . Arthritis    "joints get stiff" (06/11/2016)  . Bee sting allergy   . Bee sting allergy   . CAD (coronary artery disease), native coronary artery 02/12/2016   cath showing 50% LM, 40% distal LAD, 70% ost to prox LCs, 40% mid LAD and 95% mid to dist RCA s/p PCI with DES x 2 and stage PCI of the LCx/OM.  [name] Carotid artery stenosis    1-39% bilateral by dopplers 08/2017  . History of gout   . Hx of adenomatous colonic polyps 04/10/2008  . Hyperlipidemia   . Hypertension   . Hypothyroidism   . OSA (obstructive sleep apnea)    "tried CPAP; couldn't do it" (06/11/2016)  . Persistent  atrial fibrillation    s/p DCCV 03/2015 with reversion back to atrial fibrillation, loaded with Amio and s/p DCCV to NSR 11/2015.  He is now s/p afib albation.  He had amio lung toxicity and amio was stopped.  . Thyroid disease   . Type II diabetes mellitus ([place])   . Vitamin D deficiency    Past Surgical History:  Procedure Laterality Date  . CARDIAC CATHETERIZATION N/A 10/11/2015   Procedure: Left Heart Cath and Coronary Angiography;  Surgeon: [name], MD;  Location: [facility];  Service: Cardiovascular;  Laterality: N/A;  . CARDIAC CATHETERIZATION N/A 10/11/2015   Procedure: Coronary Stent Intervention;  Surgeon: [name], MD;  Location: [facility];  Service: Cardiovascular;  Laterality: N/A;  . CARDIAC CATHETERIZATION N/A 10/11/2015   Procedure: Intravascular Pressure Wire/FFR Study;  Surgeon: [name], MD;  Location: [facility];  Service: Cardiovascular;  Laterality: N/A;  . CARDIAC CATHETERIZATION N/A 10/14/2015   Procedure: Coronary Stent Intervention;  Surgeon: [name], MD;  Location: [facility];  Service: Cardiovascular;  Laterality: N/A;  . CARDIOVERSION N/A 04/05/2015   Procedure: CARDIOVERSION;  Surgeon: [name], MD;  Location: [facility];  Service: Cardiovascular;  Laterality: N/A;  . CARDIOVERSION N/A 12/09/2015   Procedure: CARDIOVERSION;  Surgeon: [name], MD;  Location: [facility];  Service: Cardiovascular;  Laterality: N/A;  . CARDIOVERSION N/A 03/16/2016   Procedure: CARDIOVERSION;  Surgeon: [name], MD;  Location: [facility];  Service: Cardiovascular;  Laterality: N/A;  . CATARACT EXTRACTION Right 2019   Dr. Patrici Ranks  . CORONARY ANGIOPLASTY    . ELECTROPHYSIOLOGIC STUDY N/A 06/11/2016   Procedure: Atrial Fibrillation Ablation;  Surgeon: Will [name], MD;  Location: [facility];  Service: Cardiovascular;  Laterality: N/A;  . TOTAL HIP ARTHROPLASTY Bilateral 2001  . UVULECTOMY        Current Outpatient Medications  Medication Sig Dispense Refill  . allopurinol (ZYLOPRIM) 300 MG tablet Take 1 tablet (300 mg total) by mouth daily. 90 tablet 3  . amLODipine (NORVASC) 5 MG tablet Take 1.5 tablets by mouth daily 135 tablet 3  . atorvastatin (LIPITOR) 80 MG tablet TAKE 1 TABLET BY MOUTH ONCE DAILY AT 6PM FOR  CHOLESTEROL 90 tablet 1  . B Complex Vitamins (B COMPLEX PO) Take 1 tablet by mouth daily.    . carvedilol (COREG) 12.5 MG tablet Take 1 tablet (12.5 mg total) by mouth 2 (two) times daily. 180 tablet 3  . cholecalciferol (VITAMIN D) 1000 UNITS tablet Take 2,000 Units by mouth daily.    . colchicine 0.6 MG tablet TAKE 1 TABLET BY MOUTH 1  TIME DAILY TO PREVENT GOUT 90 tablet 3  . levothyroxine (SYNTHROID, LEVOTHROID) 125 MCG tablet Take 125 mcg by mouth as directed. 1/2 tablet Tuesday, Thursday, Saturday, Sunday and 1 whole tablet Monday, Wednesday, and Friday    . loratadine (CLARITIN) 10 MG tablet Take 10 mg by mouth daily.    . magnesium gluconate (MAGONATE) 500 MG tablet Take 1,000 mg by mouth daily.     . metFORMIN (GLUCOPHAGE-XR) 500 MG 24 hr tablet TAKE 2 TABLETS BY MOUTH TWO TIMES DAILY FOR DIABETES 360 tablet 1  . nystatin cream (MYCOSTATIN) Apply 1 application topically 2 (two) times daily as needed (wound care). 30 g 1  . olmesartan (BENICAR) 40 MG tablet Take 1 tablet (40 mg total) by mouth daily. 90 tablet 0  . rivaroxaban (XARELTO) 20 MG TABS tablet take 1 tablet by mouth once daily WITH SUPPER 90 tablet 3   Current Facility-Administered Medications  Medication Dose Route Frequency Provider Last Rate Last Dose  . 0.9 %  sodium chloride infusion  500 mL Intravenous Once Gatha Mayer, MD        Allergies:   Amiodarone and Bee venom   Social History:  The patient  reports that he has never smoked. He has never used smokeless tobacco. He reports that he does not drink alcohol or use drugs.   Family History:  The patient's family history includes  Colon cancer in his sister; Lung cancer in his father and mother.    ROS:  Please see the history of present illness.   Otherwise, review of systems is positive for none.   All other systems are reviewed and negative.   PHYSICAL EXAM: VS:  BP (!) 142/94   Pulse 60   Ht 6' (1.829 m)   Wt 243 lb (110.2 kg)   BMI 32.96 kg/m  , BMI Body mass index is 32.96 kg/m. GEN: Well nourished, well developed, in no acute distress  HEENT: normal  Neck: no JVD, carotid bruits, or masses Cardiac: RRR; no murmurs, rubs, or gallops,no edema  Respiratory:  clear to auscultation bilaterally, normal work of breathing GI: soft, nontender, nondistended, + BS MS: no deformity or atrophy  Skin: warm  and dry Neuro:  Strength and sensation are intact Psych: euthymic mood, full affect  EKG:  EKG is ordered today. Personal review of the ekg ordered shows sinus rhythm, rate 60  Recent Labs: 03/31/2018: ALT 37; BUN 15; Creat 1.19; Hemoglobin 15.8; Magnesium 2.3; Platelets 247; Potassium 3.8; Sodium 140; TSH 2.23    Lipid Panel     Component Value Date/Time   CHOL 144 03/31/2018 1633   TRIG 142 03/31/2018 1633   HDL 50 03/31/2018 1633   CHOLHDL 2.9 03/31/2018 1633   VLDL 19 11/30/2016 1609   LDLCALC 71 03/31/2018 1633     Wt Readings from Last 3 Encounters:  06/14/18 243 lb (110.2 kg)  04/25/18 247 lb 9.6 oz (112.3 kg)  03/31/18 240 lb (108.9 kg)      Other studies Reviewed: Additional studies/ records that were reviewed today include: TTE 03/2015, Cath 10/14/15  Review of the above records today demonstrates:  - Left ventricle: The cavity size was normal. Wall thickness was   increased in a pattern of mild LVH. Systolic function was normal.   The estimated ejection fraction was in the range of 60% to 65%.   Indeterminant diastolic function (atrial fibrillation). Wall   motion was normal; there were no regional wall motion   abnormalities. - Aortic valve: There was no stenosis. - Aorta:  Borderline dilated aortic root. - Mitral valve: There was trivial regurgitation. - Left atrium: The atrium was mildly dilated. - Right ventricle: The cavity size was moderately dilated. Systolic   function was normal. - Right atrium: The atrium was mildly dilated. - Tricuspid valve: Peak RV-RA gradient (S): 32 mm Hg. - Pulmonary arteries: PA peak pressure: 35 mm Hg (S). - Inferior vena cava: The vessel was normal in size. The   respirophasic diameter changes were in the normal range (>= 50%),   consistent with normal central venous pressure.   Ost 1st Mrg to 1st Mrg lesion to mid circumflex, 95% stenosed. Post intervention with a 2.75 x 32 Synergy, postdilated to 3.5 mm, there is a 0% residual stenosis.  The Synergy stent extends from the OM back into the native circumflex, jailing the mid circumflex. Postprocedure, there is TIMI-3 flow in the circumflex with only a moderate stenosis at the jailed portion.   Cath 10/11/15  LM lesion, 50% stenosed.  Dist LAD lesion, 40% stenosed.  Ost Cx to Prox Cx lesion, 70% stenosed.  1st Mrg lesion, 70% stenosed.  Mid LAD lesion, 40% stenosed.  Mid RCA to Dist RCA lesion, 95% stenosed. Post intervention, there is a 0% residual stenosis. The lesion was not previously treated.   ASSESSMENT AND PLAN:  1.  Persistent atrial fibrillation/flutter: Currently on Xarelto.  Status post ablation 06/11/2016.  He remains in sinus rhythm.  He wishes to get off of Xarelto and onto a more affordable medication.  We will give him options to take to his pharmacy.  This patients CHA2DS2-VASc Score and unadjusted Ischemic Stroke Rate (% per year) is equal to 4.8 % stroke rate/year from a score of 4  Above score calculated as 1 point each if present [CHF, HTN, DM, Vascular=MI/PAD/Aortic Plaque, Age if 65-74, or Male] Above score calculated as 2 points each if present [Age > 75, or Stroke/TIA/TE]   2. Hypertension: Elevated today but is usually more normal at  home.  Plan per primary physician.  3. Hyperlipidemia: Continue statin  4. CAD: Status post circumflex and RCA PCI.  No current chest pain  5. OSA: CPAP  compliance encouraged  Current medicines are reviewed at length with the patient today.   The patient does not have concerns regarding his medicines.  The following changes were made today: None  Labs/ tests ordered today include:  No orders of the defined types were placed in this encounter.    Disposition:   FU with Will Camnitz 12 months  Signed, Will [name], MD  06/14/2018 9:57 AM     [facility] [address] [place] [address] [phone] (office) [phone] (fax)

## 2018-06-14 NOTE — Addendum Note (Signed)
Addended by: Marlis Edelson C on: 06/14/2018 05:30 PM   Modules accepted: Orders

## 2018-06-14 NOTE — Patient Instructions (Signed)
Medication Instructions:  Your physician recommends that you continue on your current medications as directed. Please refer to the Current Medication list given to you today.  * If you need a refill on your cardiac medications before your next appointment, please call your pharmacy.   Labwork: None ordered  Testing/Procedures: None ordered  Follow-Up: Your physician wants you to follow-up in: 1 year with Dr. Curt Bears.  You will receive a reminder letter in the mail two months in advance. If you don't receive a letter, please call our office to schedule the follow-up appointment.  Thank you for choosing [facility]!!   Trinidad Curet, RN [phone]  Any Other Special Instructions Will Be Listed Below (If Applicable).  Other blood thinner options: Coumadin Eliquis Pradaxa

## 2018-06-23 ENCOUNTER — Encounter: Payer: Self-pay | Admitting: Physician Assistant

## 2018-07-14 ENCOUNTER — Ambulatory Visit (INDEPENDENT_AMBULATORY_CARE_PROVIDER_SITE_OTHER): Payer: Medicare Other | Admitting: Adult Health Nurse Practitioner

## 2018-07-14 ENCOUNTER — Encounter: Payer: Self-pay | Admitting: Physician Assistant

## 2018-07-14 ENCOUNTER — Encounter: Payer: Self-pay | Admitting: Adult Health Nurse Practitioner

## 2018-07-14 VITALS — BP 170/90 | HR 63 | Temp 97.5°F | Ht 72.0 in | Wt 245.0 lb

## 2018-07-14 DIAGNOSIS — E039 Hypothyroidism, unspecified: Secondary | ICD-10-CM

## 2018-07-14 DIAGNOSIS — N182 Chronic kidney disease, stage 2 (mild): Secondary | ICD-10-CM | POA: Diagnosis not present

## 2018-07-14 DIAGNOSIS — E669 Obesity, unspecified: Secondary | ICD-10-CM | POA: Diagnosis not present

## 2018-07-14 DIAGNOSIS — E559 Vitamin D deficiency, unspecified: Secondary | ICD-10-CM | POA: Diagnosis not present

## 2018-07-14 DIAGNOSIS — E66811 Obesity, class 1: Secondary | ICD-10-CM

## 2018-07-14 DIAGNOSIS — I4819 Other persistent atrial fibrillation: Secondary | ICD-10-CM | POA: Diagnosis not present

## 2018-07-14 DIAGNOSIS — G4733 Obstructive sleep apnea (adult) (pediatric): Secondary | ICD-10-CM | POA: Diagnosis not present

## 2018-07-14 DIAGNOSIS — Z79899 Other long term (current) drug therapy: Secondary | ICD-10-CM

## 2018-07-14 DIAGNOSIS — Z0001 Encounter for general adult medical examination with abnormal findings: Secondary | ICD-10-CM

## 2018-07-14 DIAGNOSIS — M1A9XX0 Chronic gout, unspecified, without tophus (tophi): Secondary | ICD-10-CM

## 2018-07-14 DIAGNOSIS — E1122 Type 2 diabetes mellitus with diabetic chronic kidney disease: Secondary | ICD-10-CM

## 2018-07-14 DIAGNOSIS — R6889 Other general symptoms and signs: Secondary | ICD-10-CM

## 2018-07-14 DIAGNOSIS — I2510 Atherosclerotic heart disease of native coronary artery without angina pectoris: Secondary | ICD-10-CM | POA: Diagnosis not present

## 2018-07-14 DIAGNOSIS — E785 Hyperlipidemia, unspecified: Secondary | ICD-10-CM | POA: Diagnosis not present

## 2018-07-14 DIAGNOSIS — E1169 Type 2 diabetes mellitus with other specified complication: Secondary | ICD-10-CM

## 2018-07-14 DIAGNOSIS — Z0000 Encounter for general adult medical examination without abnormal findings: Secondary | ICD-10-CM

## 2018-07-14 DIAGNOSIS — I10 Essential (primary) hypertension: Secondary | ICD-10-CM

## 2018-07-14 NOTE — Progress Notes (Signed)
MEDICARE ANNUAL WELLNESS VISIT AND FOLLOW UP Assessment:   [name] was seen today for medicare wellness.  Diagnoses and all orders for this visit:  Encounter for Medicare annual wellness exam Yearly  Essential hypertension - continue medications, DASH diet, exercise and monitor at home. Call if greater than 130/80.  -     CBC with Differential/Platelet -     COMPLETE METABOLIC PANEL WITH GFR -     Magnesium  Hyperlipidemia associated with type 2 diabetes mellitus ([place]) Discussed dietary and exercise modifications -     Lipid panel  Persistent atrial fibrillation Atrial Fibrillation No concerns with excessive bleeding  No falls Continue medication: xarelto Discussed hospital precautions with patient Will continue to monitor   CKD stage 2 due to type 2 diabetes mellitus (HCC) Increase fluids, avoid NSAIDS, monitor sugars, will monitor -     COMPLETE METABOLIC PANEL WITH GFR  Type 2 diabetes mellitus with stage 2 chronic kidney disease, without long-term current use of insulin (HCC) Continue medications: Continue diet and exercise.  Perform daily foot/skin check, notify office of any concerning changes.  -     Hemoglobin A1c -     Insulin, random  Chronic gout without tophus, unspecified cause, unspecified site Gout- recheck Uric acid as needed, Diet discussed, continue medications. -     Uric acid  OSA (obstructive sleep apnea) Following with neurology for this Having issues with mask not fitting properly Causing xeropthalmia  Coronary artery disease involving native coronary artery of native heart without angina pectoris Discussed dietary and exercise modifications Tight lipid and b/p control  Obesity (BMI 30.0-34.9) Discussed dietary and exercise modifications Encouraged healthy behaviors walking 3-4 times a week.  Discussed increasing time or distance of walks.  Hypothyroidism, Taking levothyroxine 146mcg whole tablet 3 days a week, and half tablet (62.23mcg) 4  days a week. Continue medications the same pending lab results Reminded to take on an empty stomach 30-71mins before food.  -     TSH  Vitamin D deficiency Continue supplimentation -     VITAMIN D 25 Hydroxy (Vit-D Deficiency)  Medication management -     CBC with Differential/Platelet -     COMPLETE METABOLIC PANEL WITH GFR -     Magnesium -     Lipid panel -     TSH -     Hemoglobin A1c -     Insulin, random -     VITAMIN D 25 Hydroxy (Vit-D Deficiency, Fractures)   Discussed hospital precautions with patient and agrees with plan of care.  Call or return with new or worsening symptoms as discussed in appointment.  May contact office via phone [phone] or [place].  Follow up in 3 month(s) for chronic condition management.  Over 30 minutes of exam, counseling, chart review, and critical decision making was performed  Future Appointments  Date Time Provider [place]  10/19/2018  3:00 PM [name], MD [facility] None  07/18/2019  3:00 PM Garnet Sierras, NP [facility] None     Plan:   During the course of the visit the patient was educated and counseled about appropriate screening and preventive services including:    Pneumococcal vaccine   Influenza vaccine  Prevnar 13  Td vaccine  Screening electrocardiogram  Colorectal cancer screening  Diabetes screening  Glaucoma screening  Nutrition counseling    Subjective:  Christopher Wade is a 68 y.o. male who presents for Medicare Annual Wellness Visit and 3 month follow up for HTN, hyperlipidemia, prediabetes, and  vitamin D Def.   His blood pressure has been controlled at home, today their BP is BP: (!) 170/90 He does workout by walking 2-3 days a week. He denies chest pain, shortness of breath, dizziness.  He is on cholesterol medication and denies myalgias. His cholesterol is at goal. The cholesterol last visit was:   Lab Results  Component Value Date   CHOL 205 (H) 07/14/2018   HDL 44  07/14/2018   LDLCALC 129 (H) 07/14/2018   TRIG 181 (H) 07/14/2018   CHOLHDL 4.7 07/14/2018   He has been working on diet and exercise for diabetes, and denies hyperglycemia, hypoglycemia , increased appetite, nausea, polydipsia, polyuria, visual disturbances and vomiting. Last A1C in the office was:  Lab Results  Component Value Date   HGBA1C 6.7 (H) 07/14/2018   Last GFR Lab Results  Component Value Date   [facility] 82 07/14/2018     Lab Results  Component Value Date   GFRAA 95 07/14/2018   Patient is on Vitamin D supplement.   Lab Results  Component Value Date   VD25OH 64 07/14/2018      Medication Review:  Current Outpatient Medications (Endocrine & Metabolic):  .  levothyroxine (SYNTHROID, LEVOTHROID) 125 MCG tablet, Take 125 mcg by mouth as directed. 1/2 tablet Tuesday, Thursday, Saturday, Sunday and 1 whole tablet Monday, Wednesday, and Friday .  metFORMIN (GLUCOPHAGE-XR) 500 MG 24 hr tablet, TAKE 2 TABLETS BY MOUTH TWO TIMES DAILY FOR DIABETES  Current Outpatient Medications (Cardiovascular):  .  amLODipine (NORVASC) 5 MG tablet, Take 1.5 tablets by mouth daily .  atorvastatin (LIPITOR) 80 MG tablet, TAKE 1 TABLET BY MOUTH ONCE DAILY AT 6PM FOR  CHOLESTEROL .  carvedilol (COREG) 12.5 MG tablet, Take 1 tablet (12.5 mg total) by mouth 2 (two) times daily. [name]  olmesartan (BENICAR) 40 MG tablet, Take 1 tablet (40 mg total) by mouth daily.  Current Outpatient Medications (Respiratory):  .  loratadine (CLARITIN) 10 MG tablet, Take 10 mg by mouth daily.  Current Outpatient Medications (Analgesics):  .  allopurinol (ZYLOPRIM) 300 MG tablet, Take 1 tablet (300 mg total) by mouth daily. .  Colchicine 0.6 MG CAPS, Take 1 tablet by mouth daily.  Current Outpatient Medications (Hematological):  .  rivaroxaban (XARELTO) 20 MG TABS tablet, take 1 tablet by mouth once daily WITH SUPPER  Current Outpatient Medications (Other):  [name]  B Complex Vitamins (B COMPLEX PO), Take 1 tablet  by mouth daily. .  cholecalciferol (VITAMIN D) 1000 UNITS tablet, Take 2,000 Units by mouth daily. .  magnesium gluconate (MAGONATE) 500 MG tablet, Take 1,000 mg by mouth daily.  [name]  nystatin cream (MYCOSTATIN), Apply 1 application topically 2 (two) times daily as needed (wound care).  Allergies: Allergies  Allergen Reactions  . Amiodarone Other (See Comments)    Lung toxicity  . 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  Screening Tests Immunization History  Administered Date(s) Administered  . Influenza Split 04/05/2014  . Influenza, High Dose Seasonal PF 03/31/2018  . Influenza-Unspecified 02/02/2013, 05/21/2016  . Pneumococcal Conjugate-13 05/01/2014  . Pneumococcal Polysaccharide-23 05/16/2015  . Td 04/25/2010  . Varicella Zoster Immune Globulin 02/22/2014    Preventative care: Last colonoscopy: 2019  Prior vaccinations: TD or Tdap: 2011  Influenza: 2019 Pneumococcal: 2016 Prevnar13: 2015 Shingles/Zostavax: 2015  Names of Other Physician/Practitioners you currently use: 1. [facility] here for primary care 2. Eye exam 2019 3.Dentist,  Due  Patient Care Team: [name], MD as PCP - General (Internal Medicine) Constance Haw, MD as PCP - Electrophysiology (Cardiology) [name], MD as PCP - Cardiology (Cardiology) Gatha Mayer, MD as Consulting Physician (Gastroenterology) [name], MD as Consulting Physician (Cardiology)  Surgical: He  has a past surgical history that includes Cardioversion (N/A, 04/05/2015); Cardioversion (N/A, 12/09/2015); Cardioversion (N/A, 03/16/2016); Cardiac catheterization (N/A, 06/11/2016); Total hip arthroplasty (Bilateral, 2001); Uvulectomy; Cardiac catheterization (N/A, 10/11/2015); Cardiac catheterization (N/A, 10/11/2015); Cardiac catheterization (N/A, 10/11/2015); Cardiac catheterization (N/A, 10/14/2015); Coronary angioplasty; and Cataract extraction (Right, 2019). Family His family history includes Colon cancer in his sister; Lung cancer in his father and mother. Social history  He reports that he has never smoked. He has never used smokeless tobacco. He reports that he does not drink alcohol or use drugs.  MEDICARE WELLNESS OBJECTIVES: Physical activity: Current Exercise Habits: Home exercise routine, Type of exercise: walking, Frequency (Times/Week): 4, Intensity: Mild, Exercise limited by: cardiac condition(s) Cardiac risk factors: Cardiac Risk Factors include: advanced age (>18men, >44 women);dyslipidemia;diabetes mellitus;hypertension;male gender;obesity (BMI >30kg/m2);sedentary lifestyle Depression/mood screen:   Depression screen [facility] 2/9 07/20/2018  Decreased Interest 0  Down, Depressed, Hopeless 0  PHQ - 2 Score 0    ADLs:  In your present state of health, do you have any difficulty performing the following activities: 07/20/2018 12/26/2017  Hearing? N N  Vision? N N  Difficulty concentrating or making decisions? N N  Walking or climbing stairs? N N  Dressing or bathing? N N  Doing errands, shopping? N N  Preparing Food and eating ? N -  Using the  Toilet? N -  In the past six months, have you accidently leaked urine? N -  Do you have problems with loss of bowel control? N -  Managing your Medications? N -  Managing your Finances? N -  Housekeeping or managing your Housekeeping? N -  Some recent data might be hidden     Cognitive Testing  Alert? Yes  Normal Appearance?Yes  Oriented to person? Yes  Place? Yes   Time? Yes  Recall of three objects?  Yes  Can perform simple calculations? Yes  Displays appropriate judgment?Yes  Can read the correct time from a watch face?Yes  EOL planning: Does Patient Have a Medical Advance Directive?: No Would patient like information on creating a medical advance directive?: No - Patient declined   Objective:   Today's Vitals   07/14/18 1448  BP: (!) 170/90  Pulse: 63  Temp: (!) 97.5 F (36.4 C)  SpO2: 97%  Weight: 245 lb (111.1 kg)  Height: 6' (1.829 m)   Body mass index is 33.23 kg/m.  General appearance: alert, no distress, WD/WN, male HEENT: normocephalic, sclerae anicteric, TMs pearly, nares patent, no discharge or erythema, pharynx normal Oral cavity: MMM, no lesions Neck: supple, no lymphadenopathy, no  thyromegaly, no masses Heart: RRR, normal S1, S2, no murmurs Lungs: CTA bilaterally, no wheezes, rhonchi, or rales Abdomen: +bs, soft, non tender, non distended, no masses, no hepatomegaly, no splenomegaly Musculoskeletal: nontender, no swelling, no obvious deformity Extremities: no edema, no cyanosis, no clubbing Pulses: 2+ symmetric, upper and lower extremities, normal cap refill Neurological: alert, oriented x 3, CN2-12 intact, strength normal upper extremities and lower extremities, sensation normal throughout, DTRs 2+ throughout, no cerebellar signs, gait normal Psychiatric: normal affect, behavior normal, pleasant   Medicare Attestation I have personally reviewed: The patient's medical and social history Their use of alcohol, tobacco or illicit drugs Their current  medications and supplements The patient's functional ability including ADLs,fall risks, home safety risks, cognitive, and hearing and visual impairment Diet and physical activities Evidence for depression or mood disorders  The patient's weight, height, BMI, and visual acuity have been recorded in the chart.  I have made referrals, counseling, and provided education to the patient based on review of the above and I have provided the patient with a written personalized care plan for preventive services.     Garnet Sierras, NP   07/20/2018

## 2018-07-14 NOTE — Patient Instructions (Signed)
  Christopher Wade , Thank you for taking time to come for your Medicare Wellness Visit. I appreciate your ongoing commitment to your health goals. Please review the following plan we discussed and let me know if I can assist you in the future.   These are the goals we discussed: Goals    . Blood Pressure < 130/80    . Weight (lb) < 225 lb (102.1 kg)       This is a list of the screening recommended for you and due dates:  Health Maintenance  Topic Date Due  . Flu Shot  12/23/2017  . Complete foot exam   06/09/2018  . Hemoglobin A1C  09/29/2018  . Eye exam for diabetics  10/01/2018  . Tetanus Vaccine  04/25/2020  . Colon Cancer Screening  10/06/2022  .  Hepatitis C: One time screening is recommended by Center for Disease Control  (CDC) for  adults born from [birth year] through 1966.   Completed  . Pneumonia vaccines  Completed

## 2018-07-15 LAB — CBC WITH DIFFERENTIAL/PLATELET
Absolute Monocytes: 510 {cells}/uL (ref 200–950)
Basophils Absolute: 27 cells/uL (ref 0–200)
Basophils Relative: 0.4 %
Eosinophils Absolute: 129 cells/uL (ref 15–500)
Eosinophils Relative: 1.9 %
HCT: 46.8 % (ref 38.5–50.0)
Hemoglobin: 15.6 g/dL (ref 13.2–17.1)
Lymphs Abs: 1693 cells/uL (ref 850–3900)
MCH: 31.7 pg (ref 27.0–33.0)
MCHC: 33.3 g/dL (ref 32.0–36.0)
MCV: 95.1 fL (ref 80.0–100.0)
MPV: 10.3 fL (ref 7.5–12.5)
Monocytes Relative: 7.5 %
Neutro Abs: 4440 cells/uL (ref 1500–7800)
Neutrophils Relative %: 65.3 %
Platelets: 248 10*3/uL (ref 140–400)
RBC: 4.92 Million/uL (ref 4.20–5.80)
RDW: 13.7 % (ref 11.0–15.0)
Total Lymphocyte: 24.9 %
WBC: 6.8 10*3/uL (ref 3.8–10.8)

## 2018-07-15 LAB — COMPLETE METABOLIC PANEL WITH GFR
AG Ratio: 1.3 (calc) (ref 1.0–2.5)
ALT: 16 U/L (ref 9–46)
AST: 15 U/L (ref 10–35)
Albumin: 4.5 g/dL (ref 3.6–5.1)
Alkaline phosphatase (APISO): 140 U/L (ref 35–144)
BUN: 11 mg/dL (ref 7–25)
CO2: 25 mmol/L (ref 20–32)
Calcium: 9.7 mg/dL (ref 8.6–10.3)
Chloride: 100 mmol/L (ref 98–110)
Creat: 0.95 mg/dL (ref 0.70–1.25)
GFR, Est African American: 95 mL/min/{1.73_m2} (ref >=60)
GFR, Est Non African American: 82 mL/min/{1.73_m2} (ref >=60)
Globulin: 3.4 g/dL (calc) (ref 1.9–3.7)
Glucose, Bld: 126 mg/dL — ABNORMAL HIGH (ref 65–99)
Potassium: 3.6 mmol/L (ref 3.5–5.3)
Total Protein: 7.9 g/dL (ref 6.1–8.1)

## 2018-07-15 LAB — INSULIN, RANDOM: Insulin: 11.3 u[IU]/mL

## 2018-07-15 LAB — COMPLETE METABOLIC PANEL WITHOUT GFR
Sodium: 139 mmol/L (ref 135–146)
Total Bilirubin: 0.9 mg/dL (ref 0.2–1.2)

## 2018-07-15 LAB — TSH: TSH: 2.16 mIU/L (ref 0.40–4.50)

## 2018-07-15 LAB — URIC ACID: Uric Acid, Serum: 5.3 mg/dL (ref 4.0–8.0)

## 2018-07-15 LAB — LIPID PANEL
Cholesterol: 205 mg/dL — ABNORMAL HIGH (ref <200)
HDL: 44 mg/dL (ref >=40)
LDL Cholesterol (Calc): 129 mg/dL (calc) — ABNORMAL HIGH
Non-HDL Cholesterol (Calc): 161 mg/dL (calc) — ABNORMAL HIGH (ref <130)
Total CHOL/HDL Ratio: 4.7 (calc) (ref <5.0)
Triglycerides: 181 mg/dL — ABNORMAL HIGH (ref <150)

## 2018-07-15 LAB — MAGNESIUM: Magnesium: 1.8 mg/dL (ref 1.5–2.5)

## 2018-07-15 LAB — HEMOGLOBIN A1C
Hgb A1c MFr Bld: 6.7 % of total Hgb — ABNORMAL HIGH (ref <5.7)
Mean Plasma Glucose: 146 (calc)
eAG (mmol/L): 8.1 (calc)

## 2018-07-15 LAB — VITAMIN D 25 HYDROXY (VIT D DEFICIENCY, FRACTURES): Vit D, 25-Hydroxy: 64 ng/mL (ref 30–100)

## 2018-07-18 ENCOUNTER — Other Ambulatory Visit: Payer: Self-pay | Admitting: Adult Health Nurse Practitioner

## 2018-07-18 DIAGNOSIS — M1A9XX0 Chronic gout, unspecified, without tophus (tophi): Secondary | ICD-10-CM

## 2018-07-18 MED ORDER — MITIGARE 0.6 MG PO CAPS: ORAL_CAPSULE | ORAL | 1 refills | Status: DC

## 2018-07-20 ENCOUNTER — Other Ambulatory Visit: Payer: Self-pay | Admitting: Adult Health

## 2018-07-20 ENCOUNTER — Encounter: Payer: Self-pay | Admitting: Adult Health Nurse Practitioner

## 2018-07-20 ENCOUNTER — Other Ambulatory Visit: Payer: Self-pay | Admitting: Internal Medicine

## 2018-07-20 ENCOUNTER — Telehealth: Payer: Self-pay

## 2018-07-20 DIAGNOSIS — M1A9XX0 Chronic gout, unspecified, without tophus (tophi): Secondary | ICD-10-CM

## 2018-07-20 MED ORDER — COLCHICINE 0.6 MG PO CAPS: 1.0000 | ORAL_CAPSULE | Freq: Every day | ORAL | 1 refills | Status: DC

## 2018-07-20 MED ORDER — MITIGARE 0.6 MG PO CAPS: ORAL_CAPSULE | ORAL | 3 refills | Status: DC

## 2018-07-20 NOTE — Telephone Encounter (Signed)
Requesting generic of Mitigare, insurance doesn't cover this.

## 2018-07-21 ENCOUNTER — Other Ambulatory Visit: Payer: Self-pay

## 2018-07-21 DIAGNOSIS — I10 Essential (primary) hypertension: Secondary | ICD-10-CM

## 2018-07-21 MED ORDER — ATORVASTATIN CALCIUM 80 MG PO TABS: ORAL_TABLET | ORAL | 1 refills | Status: DC

## 2018-07-21 MED ORDER — OLMESARTAN MEDOXOMIL 40 MG PO TABS: 40.0000 mg | ORAL_TABLET | Freq: Every day | ORAL | 0 refills | Status: DC

## 2018-07-21 MED ORDER — METFORMIN HCL ER 500 MG PO TB24: ORAL_TABLET | ORAL | 1 refills | Status: DC

## 2018-07-21 MED ORDER — CARVEDILOL 12.5 MG PO TABS: 12.5000 mg | ORAL_TABLET | Freq: Two times a day (BID) | ORAL | 3 refills | Status: DC

## 2018-07-23 ENCOUNTER — Other Ambulatory Visit: Payer: Self-pay | Admitting: Internal Medicine

## 2018-07-23 DIAGNOSIS — M1A9XX0 Chronic gout, unspecified, without tophus (tophi): Secondary | ICD-10-CM

## 2018-07-23 MED ORDER — COLCHICINE 0.6 MG PO TABS: ORAL_TABLET | ORAL | 3 refills | Status: DC

## 2018-07-24 ENCOUNTER — Other Ambulatory Visit: Payer: Self-pay

## 2018-07-24 ENCOUNTER — Emergency Department (HOSPITAL_COMMUNITY): Payer: Medicare Other

## 2018-07-24 ENCOUNTER — Encounter (HOSPITAL_COMMUNITY): Payer: Self-pay

## 2018-07-24 ENCOUNTER — Emergency Department (HOSPITAL_COMMUNITY)
Admission: EM | Admit: 2018-07-24 | Discharge: 2018-07-24 | Disposition: A | Discharge status: Home / Self Care | Payer: Medicare Other | Attending: Emergency Medicine | Admitting: Emergency Medicine

## 2018-07-24 DIAGNOSIS — X509XXA Other and unspecified overexertion or strenuous movements or postures, initial encounter: Secondary | ICD-10-CM | POA: Diagnosis not present

## 2018-07-24 DIAGNOSIS — E039 Hypothyroidism, unspecified: Secondary | ICD-10-CM | POA: Insufficient documentation

## 2018-07-24 DIAGNOSIS — Y939 Activity, unspecified: Secondary | ICD-10-CM | POA: Insufficient documentation

## 2018-07-24 DIAGNOSIS — Y929 Unspecified place or not applicable: Secondary | ICD-10-CM | POA: Insufficient documentation

## 2018-07-24 DIAGNOSIS — Z7901 Long term (current) use of anticoagulants: Secondary | ICD-10-CM | POA: Insufficient documentation

## 2018-07-24 DIAGNOSIS — S161XXA Strain of muscle, fascia and tendon at neck level, initial encounter: Secondary | ICD-10-CM | POA: Insufficient documentation

## 2018-07-24 DIAGNOSIS — R0789 Other chest pain: Secondary | ICD-10-CM | POA: Insufficient documentation

## 2018-07-24 DIAGNOSIS — Z96643 Presence of artificial hip joint, bilateral: Secondary | ICD-10-CM | POA: Insufficient documentation

## 2018-07-24 DIAGNOSIS — I129 Hypertensive chronic kidney disease with stage 1 through stage 4 chronic kidney disease, or unspecified chronic kidney disease: Secondary | ICD-10-CM | POA: Diagnosis not present

## 2018-07-24 DIAGNOSIS — E1122 Type 2 diabetes mellitus with diabetic chronic kidney disease: Secondary | ICD-10-CM | POA: Diagnosis not present

## 2018-07-24 DIAGNOSIS — N182 Chronic kidney disease, stage 2 (mild): Secondary | ICD-10-CM | POA: Insufficient documentation

## 2018-07-24 DIAGNOSIS — Y999 Unspecified external cause status: Secondary | ICD-10-CM | POA: Diagnosis not present

## 2018-07-24 DIAGNOSIS — R0602 Shortness of breath: Secondary | ICD-10-CM | POA: Diagnosis not present

## 2018-07-24 DIAGNOSIS — Z7984 Long term (current) use of oral hypoglycemic drugs: Secondary | ICD-10-CM | POA: Insufficient documentation

## 2018-07-24 DIAGNOSIS — Z79899 Other long term (current) drug therapy: Secondary | ICD-10-CM | POA: Insufficient documentation

## 2018-07-24 DIAGNOSIS — R079 Chest pain, unspecified: Secondary | ICD-10-CM | POA: Diagnosis not present

## 2018-07-24 DIAGNOSIS — I2510 Atherosclerotic heart disease of native coronary artery without angina pectoris: Secondary | ICD-10-CM | POA: Diagnosis not present

## 2018-07-24 LAB — BASIC METABOLIC PANEL
Anion gap: 13 (ref 5–15)
BUN: 14 mg/dL (ref 8–23)
CO2: 25 mmol/L (ref 22–32)
Calcium: 8.9 mg/dL (ref 8.9–10.3)
Chloride: 99 mmol/L (ref 98–111)
Creatinine, Ser: 1.1 mg/dL (ref 0.61–1.24)
GFR calc Af Amer: >60 mL/min (ref >60)
GFR calc non Af Amer: >60 mL/min (ref >60)
Glucose, Bld: 197 mg/dL — ABNORMAL HIGH (ref 70–99)
Potassium: 2.9 mmol/L — ABNORMAL LOW (ref 3.5–5.1)
Sodium: 137 mmol/L (ref 135–145)

## 2018-07-24 LAB — CBC
HCT: 44.9 % (ref 39.0–52.0)
Hemoglobin: 15.2 g/dL (ref 13.0–17.0)
MCH: 32.9 pg (ref 26.0–34.0)
MCHC: 33.9 g/dL (ref 30.0–36.0)
MCV: 97.2 fL (ref 80.0–100.0)
Platelets: 264 10*3/uL (ref 150–400)
RBC: 4.62 MIL/uL (ref 4.22–5.81)
RDW: 14.4 % (ref 11.5–15.5)
WBC: 11 10*3/uL — ABNORMAL HIGH (ref 4.0–10.5)
nRBC: 0 % (ref 0.0–0.2)

## 2018-07-24 LAB — I-STAT TROPONIN, ED
Troponin i, poc: 0.02 ng/mL (ref 0.00–0.08)
Troponin i, poc: 0 ng/mL (ref 0.00–0.08)

## 2018-07-24 LAB — CBG MONITORING, ED: Glucose-Capillary: 185 mg/dL — ABNORMAL HIGH (ref 70–99)

## 2018-07-24 MED ORDER — CYCLOBENZAPRINE HCL 10 MG PO TABS
5.0000 mg | ORAL_TABLET | Freq: Once | ORAL | Status: DC
  Filled 2018-07-24: qty 1

## 2018-07-24 MED ORDER — ACETAMINOPHEN 500 MG PO TABS
1000.0000 mg | ORAL_TABLET | Freq: Once | ORAL | Status: AC
  Administered 2018-07-24: 1000 mg via ORAL
  Filled 2018-07-24: qty 2

## 2018-07-24 MED ORDER — LIDOCAINE 5 % EX PTCH
1.0000 | MEDICATED_PATCH | CUTANEOUS | Status: DC
  Administered 2018-07-24: 1 via TRANSDERMAL
  Filled 2018-07-24: qty 1

## 2018-07-24 MED ORDER — POTASSIUM CHLORIDE CRYS ER 20 MEQ PO TBCR
40.0000 meq | EXTENDED_RELEASE_TABLET | Freq: Once | ORAL | Status: AC
  Administered 2018-07-24: 40 meq via ORAL
  Filled 2018-07-24: qty 2

## 2018-07-24 MED ORDER — FENTANYL CITRATE (PF) 100 MCG/2ML IJ SOLN
50.0000 ug | Freq: Once | INTRAMUSCULAR | Status: AC
  Administered 2018-07-24: 50 ug via INTRAVENOUS
  Filled 2018-07-24: qty 2

## 2018-07-24 MED ORDER — NITROGLYCERIN 0.4 MG SL SUBL
0.4000 mg | SUBLINGUAL_TABLET | Freq: Once | SUBLINGUAL | Status: AC
  Administered 2018-07-24: 0.4 mg via SUBLINGUAL
  Filled 2018-07-24: qty 1

## 2018-07-24 MED ORDER — CYCLOBENZAPRINE HCL 5 MG PO TABS: 10.0000 mg | ORAL_TABLET | Freq: Two times a day (BID) | ORAL | 0 refills | Status: DC | PRN

## 2018-07-24 MED ORDER — LIDOCAINE 5 % EX PTCH: 1.0000 | MEDICATED_PATCH | CUTANEOUS | 0 refills | Status: AC

## 2018-07-24 MED ORDER — IBUPROFEN 400 MG PO TABS
600.0000 mg | ORAL_TABLET | Freq: Once | ORAL | Status: AC
  Administered 2018-07-24: 600 mg via ORAL
  Filled 2018-07-24: qty 1

## 2018-07-24 NOTE — Discharge Instructions (Addendum)
Take 600 mg of Motrin every 8 hours as needed for pain.  Take 1000 mg of Tylenol up to 4 times a day as needed for pain.  Use Flexeril lidocaine patch as prescribed.  Return to the ED if symptoms worsen.  Follow-up with your primary care doctor for reevaluation later this week.

## 2018-07-24 NOTE — ED Triage Notes (Signed)
Pt arrived with c/o CP centralized radiating to L neck and LA. Pt states that this is a new pain and due to cardiac hx his PCP informed him to come to ER

## 2018-07-24 NOTE — ED Provider Notes (Signed)
[facility] EMERGENCY DEPARTMENT Provider Note   CSN: [id number] Arrival date & time: 07/24/18  0807    History   Chief Complaint Chief Complaint  Patient presents with  . Chest Pain  . Neck Pain    HPI Christopher Wade is a 68 y.o. male.     The history is provided by the patient.  Chest Pain  Pain location:  Substernal area and L chest Pain quality: aching and dull   Pain radiates to:  Neck and L arm Pain severity:  Mild Onset quality:  Gradual Timing:  Intermittent Progression:  Waxing and waning Chronicity:  New Context: movement and raising an arm   Relieved by:  None tried Worsened by:  Movement Associated symptoms: no abdominal pain, no anxiety, no back pain, no cough, no diaphoresis, no dizziness, no dysphagia, no fever, no lower extremity edema, no nausea, no near-syncope, no numbness, no orthopnea, no palpitations, no PND, no shortness of breath, no syncope and no vomiting   Risk factors: coronary artery disease, high cholesterol and hypertension   Risk factors: no smoking     Past Medical History:  Diagnosis Date  . Arthritis    "joints get stiff" (06/11/2016)  . Bee sting allergy   . Bee sting allergy   . CAD (coronary artery disease), native coronary artery 02/12/2016   cath showing 50% LM, 40% distal LAD, 70% ost to prox LCs, 40% mid LAD and 95% mid to dist RCA s/p PCI with DES x 2 and stage PCI of the LCx/OM.  [name] Carotid artery stenosis    1-39% bilateral by dopplers 08/2017  . History of gout   . Hx of adenomatous colonic polyps 04/10/2008  . Hyperlipidemia   . Hypertension   . Hypothyroidism   . OSA (obstructive sleep apnea)    "tried CPAP; couldn't do it" (06/11/2016)  . Persistent atrial fibrillation    s/p DCCV 03/2015 with reversion back to atrial fibrillation, loaded with Amio and s/p DCCV to NSR 11/2015.  He is now s/p afib albation.  He had amio lung toxicity and amio was stopped.  . Thyroid disease   . Type II diabetes  mellitus ([place])   . Vitamin D deficiency     Patient Active Problem List   Diagnosis Date Noted  . Carotid artery stenosis   . Type II diabetes mellitus ([employer])   . Hypothyroidism   . Arthritis   . Obstructive sleep apnea of adult 08/19/2016  . 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 Persistent atrial fibrillation   . CAD (coronary artery disease), native coronary artery 02/12/2016  . Encounter for Medicare annual wellness exam 05/16/2015  . OSA (obstructive sleep apnea)   . Gout 11/12/2014  . Essential hypertension 11/12/2014  . Encounter for long-term (current) use of medications 07/31/2013  . Obesity (BMI 30.0-34.9) 07/31/2013  . Hyperlipidemia associated with type 2 diabetes mellitus ([place])   . CKD stage 2 due to type 2 diabetes mellitus ([place])   . Thyroid disease   . Vitamin D deficiency   . Hx of adenomatous colonic polyps 04/10/2008    Past Surgical History:  Procedure Laterality Date  . CARDIAC CATHETERIZATION N/A 10/11/2015   Procedure: Left Heart Cath and Coronary Angiography;  Surgeon: [name], MD;  Location: [facility];  Service: Cardiovascular;  Laterality: N/A;  . CARDIAC  CATHETERIZATION N/A 10/11/2015   Procedure: Coronary Stent Intervention;  Surgeon: [name], MD;  Location: [facility];  Service: Cardiovascular;  Laterality: N/A;  . CARDIAC CATHETERIZATION N/A 10/11/2015   Procedure: Intravascular Pressure Wire/FFR Study;  Surgeon: [name], MD;  Location: [facility];  Service: Cardiovascular;  Laterality: N/A;  . CARDIAC CATHETERIZATION N/A 10/14/2015   Procedure: Coronary Stent Intervention;  Surgeon: [name], MD;  Location: [facility];  Service: Cardiovascular;  Laterality: N/A;  . CARDIOVERSION N/A 04/05/2015   Procedure: CARDIOVERSION;  Surgeon: [name], MD;  Location:  [facility];  Service: Cardiovascular;  Laterality: N/A;  . CARDIOVERSION N/A 12/09/2015   Procedure: CARDIOVERSION;  Surgeon: [name], MD;  Location: [facility];  Service: Cardiovascular;  Laterality: N/A;  . CARDIOVERSION N/A 03/16/2016   Procedure: CARDIOVERSION;  Surgeon: [name], MD;  Location: [facility];  Service: Cardiovascular;  Laterality: N/A;  . CATARACT EXTRACTION Right 2019   Dr. Patrici Ranks  . CORONARY ANGIOPLASTY    . ELECTROPHYSIOLOGIC STUDY N/A 06/11/2016   Procedure: Atrial Fibrillation Ablation;  Surgeon: Will [name], MD;  Location: [facility];  Service: Cardiovascular;  Laterality: N/A;  . TOTAL HIP ARTHROPLASTY Bilateral 2001  . UVULECTOMY          Home Medications    Prior to Admission medications   Medication Sig Start Date End Date Taking? Authorizing Provider  allopurinol (ZYLOPRIM) 300 MG tablet Take 1 tablet (300 mg total) by mouth daily. Patient taking differently: Take 150 mg by mouth daily.  05/30/18  Yes [name], MD  amLODipine (NORVASC) 5 MG tablet Take 1.5 tablets by mouth daily Patient taking differently: Take 7.5 mg by mouth daily.  05/30/18  Yes [name], MD  atorvastatin (LIPITOR) 80 MG tablet Take one tablet at 6pm for cholesterol Patient taking differently: Take 80 mg by mouth daily at 6 PM.  07/21/18  Yes Liane Comber, NP  B Complex Vitamins (B COMPLEX PO) Take 1 tablet by mouth daily.   Yes [provider]  carvedilol (COREG) 12.5 MG tablet Take 1 tablet (12.5 mg total) by mouth 2 (two) times daily. 07/21/18  Yes Liane Comber, NP  cholecalciferol (VITAMIN D) 1000 UNITS tablet Take 2,000 Units by mouth daily.   Yes [provider]  colchicine 0.6 MG tablet Take 1 tablet daily to prevent Gout Attacks Patient taking differently: Take 0.6 mg by mouth daily as needed (to prevent gout attacks).  07/23/18  Yes [name], MD  levothyroxine (SYNTHROID, LEVOTHROID) 125 MCG tablet  Take 125 mcg by mouth See admin instructions. 1/2 tablet Tuesday, Thursday, Saturday, Sunday and 1 whole tablet Monday, Wednesday, and Friday    Yes [provider]  loratadine (CLARITIN) 10 MG tablet Take 10 mg by mouth daily.   Yes [provider]  magnesium gluconate (MAGONATE) 500 MG tablet Take 1,000 mg by mouth daily.    Yes [provider]  metFORMIN (GLUCOPHAGE-XR) 500 MG 24 hr tablet TAKE 2 TABLETS BY MOUTH TWO TIMES DAILY FOR DIABETES Patient taking differently: Take 1,000 mg by mouth 2 (two) times daily.  07/21/18  Yes Liane Comber, NP  olmesartan (BENICAR) 40 MG tablet Take 1 tablet (40 mg total) by mouth daily. 07/21/18 07/21/19 Yes Liane Comber, NP  rivaroxaban (XARELTO) 20 MG TABS tablet take 1 tablet by mouth once daily WITH SUPPER Patient taking differently: Take 20 mg by mouth daily with supper.  05/31/18  Yes  Liane Comber, NP  cyclobenzaprine (FLEXERIL) 5 MG tablet Take 2 tablets (10 mg total) by mouth 2 (two) times daily as needed for up to 12 doses for muscle spasms. 07/24/18   Paxtyn Wisdom, DO  lidocaine (LIDODERM) 5 % Place 1 patch onto the skin daily for 15 doses. Remove & Discard patch within 12 hours or as directed by MD 07/24/18 08/08/18  Lennice Sites, DO  nystatin cream (MYCOSTATIN) Apply 1 application topically 2 (two) times daily as needed (wound care). Patient not taking: Reported on 07/24/2018 04/05/15   [name], MD    Family History Family History  Problem Relation Age of Onset  . Lung cancer Mother   . Lung cancer Father   . Colon cancer Sister     Social History Social History   Tobacco Use  . Smoking status: Never Smoker  . Smokeless tobacco: Never Used  Substance Use Topics  . Alcohol use: No  . Drug use: No     Allergies   Amiodarone and Bee venom   Review of Systems Review of Systems  Constitutional: Negative for chills, diaphoresis and fever.  HENT: Negative for ear pain, sore throat and trouble  swallowing.   Eyes: Negative for pain and visual disturbance.  Respiratory: Negative for cough and shortness of breath.   Cardiovascular: Positive for chest pain. Negative for palpitations, orthopnea, syncope, PND and near-syncope.  Gastrointestinal: Negative for abdominal pain, nausea and vomiting.  Genitourinary: Negative for dysuria and hematuria.  Musculoskeletal: Positive for arthralgias, neck pain and neck stiffness. Negative for back pain.  Skin: Negative for color change and rash.  Neurological: Negative for dizziness, seizures, syncope and numbness.  All other systems reviewed and are negative.    Physical Exam Updated Vital Signs  ED Triage Vitals [07/24/18 0818]  Enc Vitals Group     BP (!) 191/96     Pulse Rate 99     Resp 16     Temp 97.7 F (36.5 C)     Temp Source Oral     SpO2 97 %     Weight      Height      Head Circumference      Peak Flow      Pain Score      Pain Loc      Pain Edu?      Excl. in [place]?     Physical Exam Vitals signs and nursing note reviewed.  Constitutional:      Appearance: He is well-developed.  HENT:     Head: Normocephalic and atraumatic.  Eyes:     Conjunctiva/sclera: Conjunctivae normal.  Neck:     Musculoskeletal: Neck supple.  Cardiovascular:     Rate and Rhythm: Normal rate and regular rhythm.     Pulses:          Radial pulses are 2+ on the right side and 2+ on the left side.       Dorsalis pedis pulses are 2+ on the right side and 2+ on the left side.     Heart sounds: Normal heart sounds. No murmur.  Pulmonary:     Effort: Pulmonary effort is normal. No respiratory distress.     Breath sounds: Normal breath sounds. No decreased breath sounds, wheezing, rhonchi or rales.  Chest:     Chest wall: Tenderness (TTP to anterior chest wall on left side into left side of neck and shoulder) present.  Abdominal:     Palpations: Abdomen is soft.  Tenderness: There is no abdominal tenderness.  Musculoskeletal: Normal  range of motion.     Right lower leg: He exhibits no tenderness. No edema.     Left lower leg: He exhibits no tenderness. No edema.     Comments: No midline spinal tenderness, increased pain when turning his head to the left with decreased range of motion, increased muscle tone to the left side of his neck  Skin:    General: Skin is warm and dry.     Capillary Refill: Capillary refill takes less than 2 seconds.  Neurological:     General: No focal deficit present.     Mental Status: He is alert.     Cranial Nerves: No cranial nerve deficit.     Motor: No weakness.      ED Treatments / Results  Labs (all labs ordered are listed, but only abnormal results are displayed) Labs Reviewed  BASIC METABOLIC PANEL - Abnormal; Notable for the following components:      Result Value   Potassium 2.9 (*)    Glucose, Bld 197 (*)    All other components within normal limits  CBC - Abnormal; Notable for the following components:   WBC 11.0 (*)    All other components within normal limits  CBG MONITORING, ED - Abnormal; Notable for the following components:   Glucose-Capillary 185 (*)    All other components within normal limits  I-STAT TROPONIN, ED  I-STAT TROPONIN, ED    EKG EKG Interpretation  Date/Time:  Sunday July 24 2018 08:20:01 EST Ventricular Rate:  98 PR Interval:    QRS Duration: 96 QT Interval:  366 QTC Calculation: 468 R Axis:   23 Text Interpretation:  Sinus rhythm Borderline low voltage, extremity leads Minimal ST depression, lateral leads Baseline wander in lead(s) V2 Confirmed by Lennice Sites [phone]) on 07/24/2018 8:26:36 AM   Radiology Dg Chest Portable 1 View  Result Date: 07/24/2018 CLINICAL DATA:  Chest pain and shortness of breath EXAM: PORTABLE CHEST 1 VIEW COMPARISON:  01/29/2017 FINDINGS: Cardiac shadow is mildly enlarged but accentuated by the portable technique. Mild bibasilar atelectatic changes are seen. No focal confluent infiltrate is noted. No sizable  effusion is seen. No bony abnormality is noted. IMPRESSION: Bibasilar atelectatic changes left slightly greater than right. Electronically Signed   By: Inez Catalina M.D.   On: 07/24/2018 08:56    Procedures Procedures (including critical care time)  Medications Ordered in ED Medications  lidocaine (LIDODERM) 5 % 1 patch (1 patch Transdermal Patch Applied 07/24/18 0856)  cyclobenzaprine (FLEXERIL) tablet 5 mg (5 mg Oral Not Given 07/24/18 1021)  nitroGLYCERIN (NITROSTAT) SL tablet 0.4 mg (0.4 mg Sublingual Given 07/24/18 0833)  fentaNYL (SUBLIMAZE) injection 50 mcg (50 mcg Intravenous Given 07/24/18 0858)  acetaminophen (TYLENOL) tablet 1,000 mg (1,000 mg Oral Given 07/24/18 0856)  potassium chloride SA (K-DUR,KLOR-CON) CR tablet 40 mEq (40 mEq Oral Given 07/24/18 0956)  ibuprofen (ADVIL,MOTRIN) tablet 600 mg (600 mg Oral Given 07/24/18 1021)     Initial Impression / Assessment and Plan / ED Course  I have reviewed the triage vital signs and the nursing notes.  Pertinent labs & imaging results that were available during my care of the patient were reviewed by me and considered in my medical decision making (see chart for details).        MAVRIC CORTRIGHT is a 68 year old male with history of hypertension, high cholesterol, CAD status post stent, atrial fibrillation status post ablation on Xarelto who  presents the ED with chest wall pain, left-sided neck pain, left shoulder pain.  Patient with overall unremarkable vitals.  Pain has been on and off for the last 3 days.  Tried some over-the-counter muscle rub with some relief.  Patient denies any shortness of breath, headaches, abdominal pain.  States that pain is worse with movement and lifting up his arm especially his left arm.  Patient has fairly reproducible tenderness over the left side of his chest wall, paraspinal cervical muscle area.  Pain with range of motion of his left shoulder.  Patient has normal pulses throughout.  Good strength and sensation  throughout.  Overall neurologically intact.  No midline spinal tenderness.  Denies any cough or infectious symptoms.  Patient does have significant cardiac history will evaluate with EKG and serial troponins.  EKG showed sinus rhythm with no ischemic changes.  Overall history and physical is consistent with musculoskeletal origin of pain but will further evaluate cardiac work-up with serial troponins and will try a dose of sublingual nitroglycerin to see if he gets any pain relief.  We will get chest x-ray and basic labs as well.  Will reevaluate after nitroglycerin and likely start to treat with anti-inflammatories.  Patient is on Xarelto and does not have any hypoxia, shortness of breath and doubt PE.  Troponin within normal limits.  No significant leukocytosis, anemia, kidney injury.  Patient did not have any improvement with nitroglycerin.  Was given Tylenol, lidocaine patch, Motrin, fentanyl with improvement of symptoms.  Patient had mild hypokalemia of 2.9 and was repleted.  Patient takes potassium at home.  Chest x-ray did not show any significant findings, no pneumonia, no pneumothorax, no pleural effusion.  Repeat troponin within normal limits as well.  Overall atypical chest pain that is appearing to be more musculoskeletal in origin.  Patient had reproducible tenderness, pain when he moves his left upper extremity at the left shoulder joint.  Had improvement with anti-inflammatory pain meds.  Will prescribe Flexeril, lidocaine patch.  Recommend continued use of Tylenol Motrin at home.  Recommend close follow-up with primary care doctor and given return precautions.  If symptoms persist or change he should return to the ED but at this time do not believe that symptoms are consistent with ACS.  Patient was discharged from ED in good condition.  This chart was dictated using voice recognition software.  Despite best efforts to proofread,  errors can occur which can change the documentation meaning.     Final Clinical Impressions(s) / ED Diagnoses   Final diagnoses:  Atypical chest pain  Chest wall pain  Acute strain of neck muscle, initial encounter    ED Discharge Orders         Ordered    cyclobenzaprine (FLEXERIL) 5 MG tablet  2 times daily PRN     07/24/18 1217    lidocaine (LIDODERM) 5 %  Every 24 hours     07/24/18 1217           Lennice Sites, DO 07/24/18 1310

## 2018-08-01 ENCOUNTER — Telehealth: Payer: Self-pay

## 2018-08-01 NOTE — Telephone Encounter (Signed)
The pt returned his Christopher Wade and Christopher Wade PT Asst application for Xarelto to the office . I have completed the provider part of that application and placed it in Dr Curt Bears mail bin awaiting his signature.

## 2018-08-16 NOTE — Telephone Encounter (Signed)
Letter received from [facility] and [employer] stating that they have denied the pt assistance with his Xarelto. Reason: The pt does not meet the eligibility requirements at this time.  The letter states that they have reached out to the pt.

## 2018-08-22 ENCOUNTER — Telehealth: Payer: Self-pay | Admitting: Cardiology

## 2018-08-22 NOTE — Telephone Encounter (Signed)
F/U Message            Patient is returning [place] call. Would like a call back.

## 2018-08-22 NOTE — Telephone Encounter (Signed)
See phone note from 08/01/2018.

## 2018-08-22 NOTE — Telephone Encounter (Signed)
**Note De-Identified Christopher Wade Obfuscation** I am working today and did leave a message for the pt to call me back today see note from 3/9. Will call pt now.

## 2018-08-22 NOTE — Telephone Encounter (Signed)
I have left another message on the pts VM stating that I will call him back in 10 mins as he may not recognize my number.

## 2018-08-22 NOTE — Telephone Encounter (Signed)
Pt returned call to [place], LPN in regards to Xarelto asst. I explained to the pt Jeani Hawking was not in the office today, nor working remotely due to [employer]. I advised pt looks like Jeani Hawking called on 08/16/18 to advise [employer] denied asst program for Xarelto stating reason pt does not meet requirement at this time. I asked pt if he had any Xarelto left as this is a medication to help reduce his risk pf stroke and he cannot run out of this medication. Pt states he only has a few days left. I stated I will have our refill dept check and see if there are some samples he can have. I will ask for the refill dept to call the pt and place samples at the front desk. I assured the pt that I will also send phone note to [facility], LPN as to our conversation today. I did tell the pt I am not sure if there is something else Jeani Hawking can do to help out with the Xarelto. Pt thanked me for the call.

## 2018-08-22 NOTE — Telephone Encounter (Signed)
Patient is calling to check on status of paper work.

## 2018-08-22 NOTE — Telephone Encounter (Signed)
Left message to call me back.

## 2018-08-22 NOTE — Telephone Encounter (Addendum)
**Note De-Identified Emmanuel Ercole Obfuscation** The pt does not answer when I call so I left a detailed message on his VM asking him to contact [facility] because they denied him and that we cannot give him samples as the office is low at this time.  I also stated in the message that I am working from home so if a blocked number or unidentified number shows up on his caller ID that it is most likely me calling him back.  FYISheila Oats and Eliquis is on the pts plan as a preferred drug but it is name brand medication that is at a tier 3 already and cannot be lowered further under medicare PartD rules.   He can also contact his insurance company to asked what can be done to lower the cost of Xarelto. Unfortunately, he may have to switch to Coumadin if he cannot afford Xarelto.   I will continue to try to reach him by phone.

## 2018-08-23 NOTE — Telephone Encounter (Signed)
LMTCB

## 2018-08-23 NOTE — Telephone Encounter (Signed)
I think we need to involve to make further recommendations of medication.

## 2018-08-24 NOTE — Telephone Encounter (Signed)
lmtcb to discuss

## 2018-08-25 ENCOUNTER — Telehealth: Payer: Self-pay | Admitting: Cardiology

## 2018-08-25 NOTE — Telephone Encounter (Signed)
Follow up:     Patient returning a call back. Please call patient. 

## 2018-08-26 ENCOUNTER — Other Ambulatory Visit: Payer: Self-pay | Admitting: Internal Medicine

## 2018-08-26 DIAGNOSIS — I10 Essential (primary) hypertension: Secondary | ICD-10-CM

## 2018-08-26 DIAGNOSIS — E039 Hypothyroidism, unspecified: Secondary | ICD-10-CM

## 2018-08-26 MED ORDER — LEVOTHYROXINE SODIUM 100 MCG PO TABS: ORAL_TABLET | ORAL | 3 refills | Status: DC

## 2018-08-26 MED ORDER — AMLODIPINE BESYLATE 5 MG PO TABS: ORAL_TABLET | ORAL | 3 refills | Status: DC

## 2018-08-29 ENCOUNTER — Other Ambulatory Visit: Payer: Self-pay | Admitting: Internal Medicine

## 2018-08-29 DIAGNOSIS — M1A9XX0 Chronic gout, unspecified, without tophus (tophi): Secondary | ICD-10-CM

## 2018-08-29 MED ORDER — COLCHICINE 0.6 MG PO TABS: ORAL_TABLET | ORAL | 3 refills | Status: DC

## 2018-08-30 ENCOUNTER — Encounter: Payer: Self-pay | Admitting: Cardiology

## 2018-08-30 NOTE — Telephone Encounter (Signed)
Vickey Huger F at 08/30/2018 10:58 AM  Status: Signed    F/U Message Patient is returning Sherry's call

## 2018-08-30 NOTE — Telephone Encounter (Signed)
This encounter was created in error - please disregard.

## 2018-08-30 NOTE — Telephone Encounter (Signed)
Pt reports he is still working with [employer] on affordability. Pt will stop by the office today to pick up 2 weeks of samples. Pt will call me back once he hears back from [facility]. We will discuss switching to Coumadin if he is unable to get Xarelto more affordable.

## 2018-08-30 NOTE — Telephone Encounter (Signed)
F/U Message          Patient is returning Sherry's call

## 2018-09-06 ENCOUNTER — Telehealth: Payer: Self-pay | Admitting: Physician Assistant

## 2018-09-06 NOTE — Telephone Encounter (Signed)
Will discuss CCM with patient about obesity and CAD with better control of BP and DM.  Left message Recent Visits Date Type Provider Dept  07/14/18 Office Visit Garnet Sierras, NP [facility]  03/31/18 Office Visit Liane Comber, NP [facility]  12/22/17 Office Visit [name], MD [facility]  09/16/17 Office Visit Vicie Mutters, PA-C [facility]  06/09/17 Office Visit Vicie Mutters, PA-C [facility] recent visits within past 540 days with a meds authorizing provider and meeting all other requirements   Future Appointments Date Type Provider Dept  10/19/18 Appointment [name], MD [facility] future appointments within next 150 days with a meds authorizing provider and meeting all other requirements

## 2018-09-21 ENCOUNTER — Telehealth: Payer: Self-pay | Admitting: Cardiology

## 2018-09-21 NOTE — Telephone Encounter (Signed)
  Patient needs to speak with Christopher Wade in regards to his Xarelto. He stated that she would know what it is about.

## 2018-09-21 NOTE — Telephone Encounter (Addendum)
forwarding to prior auth dept to call pt as this is r/t medication assistance. Pt aware that dept will contact him about this

## 2018-09-22 ENCOUNTER — Other Ambulatory Visit: Payer: Self-pay | Admitting: Cardiology

## 2018-09-22 MED ORDER — WARFARIN SODIUM 5 MG PO TABS: 5.0000 mg | ORAL_TABLET | Freq: Every day | ORAL | 1 refills | Status: DC

## 2018-09-22 NOTE — Telephone Encounter (Signed)
Marcelle Overlie, pharmacist sent in new rx for Warfarin for this pt today #30 tabs with 1 refill to be started on 09/25/18.  A 90 day supply is not appropriate at this time, unsure what dosage pt will end up on and need to confirm compliance with INR monitoring and follow-up appts.  90 day rx denied to pharmacy.

## 2018-09-22 NOTE — Telephone Encounter (Signed)
LMOM to call to discuss transition to warfarin.

## 2018-09-22 NOTE — Telephone Encounter (Signed)
Discussed transition to coumadin. Patient states he has 6 days of Xarelto left. I advised he is to overlap coumadin and xarelto for 3 days. He is to start taking warfarin 5mg  once daily at [facility] on Sunday 5/3. His last dose of Xarelto should be Tuesday 5/5. Continue taking just warfarin after that. New coumadin appointment scheduled for 5/7 @ 10. Rx for warfarin 5mg  sent to pharmacy. Patient understands instructions and will call with any questions

## 2018-09-22 NOTE — Telephone Encounter (Signed)
Pt denied medication assistance for both Eliquis & Xarelto.  States it is still too expensive. Pt informed that I would have our Coumadin clinic contact him about discussion for possibly switching to Coumadin which will most likely be more affordable. Pt understands I will follow up with him next week.

## 2018-09-23 ENCOUNTER — Other Ambulatory Visit: Payer: Self-pay | Admitting: Internal Medicine

## 2018-09-27 ENCOUNTER — Telehealth: Payer: Self-pay

## 2018-09-27 NOTE — Telephone Encounter (Signed)

## 2018-09-28 ENCOUNTER — Other Ambulatory Visit: Payer: Self-pay | Admitting: *Deleted

## 2018-09-29 ENCOUNTER — Other Ambulatory Visit: Payer: Self-pay

## 2018-09-29 ENCOUNTER — Ambulatory Visit (INDEPENDENT_AMBULATORY_CARE_PROVIDER_SITE_OTHER): Payer: Medicare Other | Admitting: *Deleted

## 2018-09-29 DIAGNOSIS — Z5181 Encounter for therapeutic drug level monitoring: Secondary | ICD-10-CM | POA: Diagnosis not present

## 2018-09-29 DIAGNOSIS — I4819 Other persistent atrial fibrillation: Secondary | ICD-10-CM

## 2018-09-29 LAB — POCT INR: INR: 1.3 — AB (ref 2.0–3.0)

## 2018-09-29 NOTE — Patient Instructions (Addendum)
A full discussion of the nature of anticoagulants has been carried out.  A benefit risk analysis has been presented to the patient, so that they understand the justification for choosing anticoagulation at this time. The need for frequent and regular monitoring, precise dosage adjustment and compliance is stressed.  Side effects of potential bleeding are discussed.  The patient should avoid any OTC items containing aspirin or ibuprofen, and should avoid great swings in general diet.  Avoid alcohol consumption.  Call if any signs of abnormal bleeding.   Description   Today take 1.5 tablets then continue taking 1 tablet daily. Recheck INR in 1 week. Coumadin Clinic: [phone] Main number: [phone]

## 2018-10-03 ENCOUNTER — Telehealth: Payer: Self-pay

## 2018-10-03 NOTE — Telephone Encounter (Signed)
lmom for prescreen  

## 2018-10-04 ENCOUNTER — Other Ambulatory Visit: Payer: Self-pay

## 2018-10-04 ENCOUNTER — Ambulatory Visit (INDEPENDENT_AMBULATORY_CARE_PROVIDER_SITE_OTHER): Payer: Medicare Other | Admitting: Pharmacist

## 2018-10-04 DIAGNOSIS — Z5181 Encounter for therapeutic drug level monitoring: Secondary | ICD-10-CM

## 2018-10-04 DIAGNOSIS — I4819 Other persistent atrial fibrillation: Secondary | ICD-10-CM

## 2018-10-04 LAB — POCT INR: INR: 1.7 — AB (ref 2.0–3.0)

## 2018-10-10 ENCOUNTER — Telehealth: Payer: Self-pay

## 2018-10-10 NOTE — Telephone Encounter (Signed)
lmom for prescreen  

## 2018-10-10 NOTE — Telephone Encounter (Signed)

## 2018-10-11 ENCOUNTER — Other Ambulatory Visit: Payer: Self-pay

## 2018-10-11 ENCOUNTER — Ambulatory Visit (INDEPENDENT_AMBULATORY_CARE_PROVIDER_SITE_OTHER): Payer: Medicare Other | Admitting: *Deleted

## 2018-10-11 DIAGNOSIS — I4819 Other persistent atrial fibrillation: Secondary | ICD-10-CM

## 2018-10-11 DIAGNOSIS — Z5181 Encounter for therapeutic drug level monitoring: Secondary | ICD-10-CM | POA: Diagnosis not present

## 2018-10-11 LAB — POCT INR: INR: 1.5 — AB (ref 2.0–3.0)

## 2018-10-11 NOTE — Patient Instructions (Addendum)
Description    Spoke with pt and instructed him to take 1.5 tabs today and then take 1 tablet daily excpet for Monday and Fridays take 1.5 tablets. Recheck INR in 1 week. Coumadin Clinic: [phone] Main number: [phone]

## 2018-10-18 ENCOUNTER — Telehealth: Payer: Self-pay

## 2018-10-18 NOTE — Telephone Encounter (Signed)

## 2018-10-18 NOTE — Telephone Encounter (Signed)
lmom for prescreen  

## 2018-10-19 ENCOUNTER — Ambulatory Visit (INDEPENDENT_AMBULATORY_CARE_PROVIDER_SITE_OTHER): Payer: Medicare Other | Admitting: Internal Medicine

## 2018-10-19 ENCOUNTER — Other Ambulatory Visit: Payer: Self-pay | Admitting: Cardiology

## 2018-10-19 ENCOUNTER — Other Ambulatory Visit: Payer: Self-pay | Admitting: Adult Health

## 2018-10-19 ENCOUNTER — Encounter: Payer: Self-pay | Admitting: Internal Medicine

## 2018-10-19 ENCOUNTER — Ambulatory Visit (INDEPENDENT_AMBULATORY_CARE_PROVIDER_SITE_OTHER): Payer: Medicare Other | Admitting: Pharmacist

## 2018-10-19 ENCOUNTER — Other Ambulatory Visit: Payer: Self-pay

## 2018-10-19 VITALS — BP 128/84 | HR 72 | Temp 97.0°F | Resp 18 | Ht 70.5 in | Wt 253.2 lb

## 2018-10-19 DIAGNOSIS — Z136 Encounter for screening for cardiovascular disorders: Secondary | ICD-10-CM | POA: Diagnosis not present

## 2018-10-19 DIAGNOSIS — G4733 Obstructive sleep apnea (adult) (pediatric): Secondary | ICD-10-CM | POA: Diagnosis not present

## 2018-10-19 DIAGNOSIS — Z1211 Encounter for screening for malignant neoplasm of colon: Secondary | ICD-10-CM

## 2018-10-19 DIAGNOSIS — I480 Paroxysmal atrial fibrillation: Secondary | ICD-10-CM | POA: Diagnosis not present

## 2018-10-19 DIAGNOSIS — Z5181 Encounter for therapeutic drug level monitoring: Secondary | ICD-10-CM | POA: Diagnosis not present

## 2018-10-19 DIAGNOSIS — Z79899 Other long term (current) drug therapy: Secondary | ICD-10-CM

## 2018-10-19 DIAGNOSIS — N182 Chronic kidney disease, stage 2 (mild): Secondary | ICD-10-CM

## 2018-10-19 DIAGNOSIS — Z125 Encounter for screening for malignant neoplasm of prostate: Secondary | ICD-10-CM

## 2018-10-19 DIAGNOSIS — N138 Other obstructive and reflux uropathy: Secondary | ICD-10-CM | POA: Diagnosis not present

## 2018-10-19 DIAGNOSIS — I10 Essential (primary) hypertension: Secondary | ICD-10-CM

## 2018-10-19 DIAGNOSIS — E1122 Type 2 diabetes mellitus with diabetic chronic kidney disease: Secondary | ICD-10-CM | POA: Diagnosis not present

## 2018-10-19 DIAGNOSIS — M1A9XX0 Chronic gout, unspecified, without tophus (tophi): Secondary | ICD-10-CM | POA: Diagnosis not present

## 2018-10-19 DIAGNOSIS — E559 Vitamin D deficiency, unspecified: Secondary | ICD-10-CM | POA: Diagnosis not present

## 2018-10-19 DIAGNOSIS — E782 Mixed hyperlipidemia: Secondary | ICD-10-CM | POA: Diagnosis not present

## 2018-10-19 DIAGNOSIS — N401 Enlarged prostate with lower urinary tract symptoms: Secondary | ICD-10-CM | POA: Diagnosis not present

## 2018-10-19 DIAGNOSIS — I4819 Other persistent atrial fibrillation: Secondary | ICD-10-CM | POA: Diagnosis not present

## 2018-10-19 DIAGNOSIS — E039 Hypothyroidism, unspecified: Secondary | ICD-10-CM

## 2018-10-19 DIAGNOSIS — I2510 Atherosclerotic heart disease of native coronary artery without angina pectoris: Secondary | ICD-10-CM | POA: Diagnosis not present

## 2018-10-19 DIAGNOSIS — Z1212 Encounter for screening for malignant neoplasm of rectum: Secondary | ICD-10-CM

## 2018-10-19 DIAGNOSIS — J849 Interstitial pulmonary disease, unspecified: Secondary | ICD-10-CM | POA: Diagnosis not present

## 2018-10-19 LAB — POCT INR: INR: 1.4 — AB (ref 2.0–3.0)

## 2018-10-19 MED ORDER — WARFARIN SODIUM 5 MG PO TABS: ORAL_TABLET | ORAL | 1 refills | Status: DC

## 2018-10-19 NOTE — Patient Instructions (Signed)

## 2018-10-19 NOTE — Progress Notes (Signed)
[facility]   [name], M.D.     Uvaldo Bristle. Silverio Lay, P.A.-C Liane Comber, [place]                [address] [place], [place] [address] Telephone [phone] Telefax [phone] Annual  Screening/Preventative Visit  & Comprehensive Evaluation & Examination  History of Present Illness:     This very nice 68 y.o. single WM presents for a Screening /Preventative Visit & comprehensive evaluation and management of multiple medical co-morbidities.  Patient has been followed for HTN, ASCAD,  HLD, T2_NIDDM  and Vitamin D Deficiency. Patient has hx/o OSA ad is Intolerant to masks.      HTN predates since 2006. Patient's BP has been controlled at home.  Today's BP: 128/84. Patient was dx'd with pAfib (2016) and had CV's  x 2 (2016 & 2017) and then in Jan 2018 had an Afib Ablation  by Dr Curt Bears. Patient is  followed by Dr Fransico Him  Patient also has hx/o Pulm Toxicity on Amiodarone.  He is currently being transitioned from Xarelto to Coumadin for financial considerations and followed at [facility]. He denies any cardiac symptoms as chest pain, palpitations, shortness of breath, dizziness or ankle swelling.     Patient's hyperlipidemia is not controlled with diet and Atorvastatin. Patient denies myalgias or other medication SE's. Last lipids were not at goal: Lab Results  Component Value Date   CHOL 205 (H) 07/14/2018   HDL 44 07/14/2018   LDLCALC 129 (H) 07/14/2018   TRIG 181 (H) 07/14/2018   CHOLHDL 4.7 07/14/2018      Patient has Morbid Obesity (BMI 35+) and  hx/o T2_NIDDM (2012) on Metformin and he denies reactive hypoglycemic symptoms, visual blurring, diabetic polys or paresthesias. Last A1c was not at goal: Lab Results  Component Value Date   HGBA1C 6.7 (H) 07/14/2018       Patient has been on Thyroid replacement since 2011.      Finally, patient has history of  Vitamin D Deficiency ("23"/ 2009) and last vitamin D was at goal: Lab Results  Component Value Date   VD25OH 64 07/14/2018   Current Outpatient Medications on File Prior to Visit  Medication Sig  . allopurinol (ZYLOPRIM) 300 MG tablet Take 1 tablet (300 mg total) by mouth daily. (Patient taking differently: Take 150 mg by mouth daily. )  . amLODipine (NORVASC) 5 MG tablet Take 1 & 1/2 Daily for BP  . atorvastatin (LIPITOR) 80 MG tablet Take one tablet at 6pm for cholesterol (Patient taking differently: Take 80 mg by mouth daily at 6 PM. )  . B Complex Vitamins (B COMPLEX PO) Take 1 tablet by mouth daily.  . carvedilol (COREG) 12.5 MG tablet Take 1 tablet (12.5 mg total) by mouth 2 (two) times daily.  . cholecalciferol (VITAMIN D) 1000 UNITS tablet Take 2,000 Units by mouth daily.  . colchicine 0.6 MG tablet Take 1 tablet daily to prevent Gout Attacks  . levothyroxine (SYNTHROID, LEVOTHROID) 100 MCG tablet Take 1 tablet daily on an empty stomach with only water for 30 minutes & no Antacid meds, Calcium or Magnesium for 4 hours & avoid Biotin  . loratadine (CLARITIN) 10 MG tablet Take 10 mg by mouth daily.  . magnesium gluconate (MAGONATE) 500 MG tablet Take 1,000 mg by mouth daily.   . metFORMIN (GLUCOPHAGE-XR) 500 MG  24 hr tablet TAKE 2 TABLETS BY MOUTH TWO TIMES DAILY FOR DIABETES (Patient taking differently: Take 1,000 mg by mouth 2 (two) times daily. )   No current facility-administered medications on file prior to visit.    Allergies  Allergen Reactions  . Amiodarone Other (See Comments)    Lung toxicity  . Bee Venom Other (See Comments)    Leg went numb   Past Medical History:  Diagnosis Date  . Arthritis    "joints get stiff" (06/11/2016)  . Bee sting allergy   . Bee sting allergy   . CAD (coronary artery disease), native coronary artery 02/12/2016   cath showing 50% LM, 40% distal LAD, 70% ost to prox LCs, 40% mid LAD and 95% mid to dist RCA s/p PCI with DES x 2 and stage PCI  of the LCx/OM.  [name] Carotid artery stenosis    1-39% bilateral by dopplers 08/2017  . History of gout   . Hx of adenomatous colonic polyps 04/10/2008  . Hyperlipidemia   . Hypertension   . Hypothyroidism   . OSA (obstructive sleep apnea)    "tried CPAP; couldn't do it" (06/11/2016)  . Persistent atrial fibrillation    s/p DCCV 03/2015 with reversion back to atrial fibrillation, loaded with Amio and s/p DCCV to NSR 11/2015.  He is now s/p afib albation.  He had amio lung toxicity and amio was stopped.  . Thyroid disease   . Type II diabetes mellitus ([place])   . Vitamin D deficiency    Health Maintenance  Topic Date Due  . FOOT EXAM  06/09/2018  . OPHTHALMOLOGY EXAM  10/01/2018  . INFLUENZA VACCINE  12/24/2018  . HEMOGLOBIN A1C  01/12/2019  . TETANUS/TDAP  04/25/2020  . COLONOSCOPY  10/06/2022  . Hepatitis C Screening  Completed  . PNA vac Low Risk Adult  Completed   Immunization History  Administered Date(s) Administered  . Influenza Split 04/05/2014  . Influenza, High Dose Seasonal PF 03/31/2018  . Influenza-Unspecified 02/02/2013, 05/21/2016  . Pneumococcal Conjugate-13 05/01/2014  . Pneumococcal Polysaccharide-23 05/16/2015  . Td 04/25/2010  . Varicella Zoster Immune Globulin 02/22/2014   Last Colon - 10/05/2017 - Dr Carlean Purl recc 5 yr f/u due May 2024.   Past Surgical History:  Procedure Laterality Date  . CARDIAC CATHETERIZATION N/A 10/11/2015   Procedure: Left Heart Cath and Coronary Angiography;  Surgeon: [name], MD;  Location: [facility];  Service: Cardiovascular;  Laterality: N/A;  . CARDIAC CATHETERIZATION N/A 10/11/2015   Procedure: Coronary Stent Intervention;  Surgeon: [name], MD;  Location: [facility];  Service: Cardiovascular;  Laterality: N/A;  . CARDIAC CATHETERIZATION N/A 10/11/2015   Procedure: Intravascular Pressure Wire/FFR Study;  Surgeon: [name], MD;  Location: [facility];  Service: Cardiovascular;  Laterality:  N/A;  . CARDIAC CATHETERIZATION N/A 10/14/2015   Procedure: Coronary Stent Intervention;  Surgeon: [name], MD;  Location: [facility];  Service: Cardiovascular;  Laterality: N/A;  . CARDIOVERSION N/A 04/05/2015   Procedure: CARDIOVERSION;  Surgeon: [name], MD;  Location: [facility];  Service: Cardiovascular;  Laterality: N/A;  . CARDIOVERSION N/A 12/09/2015   Procedure: CARDIOVERSION;  Surgeon: [name], MD;  Location: [facility];  Service: Cardiovascular;  Laterality: N/A;  . CARDIOVERSION N/A 03/16/2016   Procedure: CARDIOVERSION;  Surgeon: [name], MD;  Location: [facility];  Service: Cardiovascular;  Laterality: N/A;  . CATARACT EXTRACTION Right 2019   Dr. Patrici Ranks  .  CORONARY ANGIOPLASTY    . ELECTROPHYSIOLOGIC STUDY N/A 06/11/2016   Procedure: Atrial Fibrillation Ablation;  Surgeon: Will [name], MD;  Location: [facility];  Service: Cardiovascular;  Laterality: N/A;  . TOTAL HIP ARTHROPLASTY Bilateral 2001  . UVULECTOMY     Family History  Problem Relation Age of Onset  . Lung cancer Mother   . Lung cancer Father   . Colon cancer Sister    Social History   Socioeconomic History  . Marital status: Single  . Number of children: None  Occupational History  . Retired Nature conservation officer  Tobacco Use  . Smoking status: Never Smoker  . Smokeless tobacco: Never Used  Substance and Sexual Activity  . Alcohol use: No  . Drug use: No  . 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  Currently struggling to find much work.09/28/2017   Denies alcohol tobacco or drug use    ROS Constitutional: Denies fever, chills, weight loss/gain, headaches, insomnia,  night sweats or change in appetite. Does c/o fatigue. Eyes: Denies redness, blurred vision, diplopia, discharge, itchy or watery eyes.  ENT: Denies discharge, congestion, post nasal drip, epistaxis,  sore throat, earache, hearing loss, dental pain, Tinnitus, Vertigo, Sinus pain or snoring.  Cardio: Denies chest pain, palpitations, irregular heartbeat, syncope, dyspnea, diaphoresis, orthopnea, PND, claudication or edema Respiratory: denies cough, dyspnea, DOE, pleurisy, hoarseness, laryngitis or wheezing.  Gastrointestinal: Denies dysphagia, heartburn, reflux, water brash, pain, cramps, nausea, vomiting, bloating, diarrhea, constipation, hematemesis, melena, hematochezia, jaundice or hemorrhoids Genitourinary: Denies dysuria, frequency, urgency, nocturia, hesitancy, discharge, hematuria or flank pain Musculoskeletal: Denies arthralgia, myalgia, stiffness, Jt. Swelling, pain, limp or strain/sprain. Denies Falls. Skin: Denies puritis, rash, hives, warts, acne, eczema or change in skin lesion Neuro: No weakness, tremor, incoordination, spasms, paresthesia or pain Psychiatric: Denies confusion, memory loss or sensory loss. Denies Depression. Endocrine: Denies change in weight, skin, hair change, nocturia, and paresthesia, diabetic polys, visual blurring or hyper / hypo glycemic episodes.  Heme/Lymph: No excessive bleeding, bruising or enlarged lymph nodes.  Physical Exam  BP 128/84   Pulse 72   Temp (!) 97 F (36.1 C)   Resp 18   Ht 5' 10.5" (1.791 m)   Wt 253 lb 3.2 oz (114.9 kg)   BMI 35.82 kg/m   General Appearance: Over nourished and well groomed and in no apparent distress.  Eyes: PERRLA, EOMs, conjunctiva no swelling or erythema, normal fundi and vessels. Sinuses: No frontal/maxillary tenderness ENT/Mouth: EACs patent / TMs  nl. Nares clear without erythema, swelling, mucoid exudates. Oral hygiene is good. No erythema, swelling, or exudate. Tongue normal, non-obstructing. Tonsils not swollen or erythematous. Hearing normal.  Neck: Supple, thyroid not palpable. No bruits, nodes or JVD. Respiratory: Respiratory effort normal.  BS equal and clear bilateral without rales, rhonci,  wheezing or stridor. Cardio: Heart sounds are normal with regular rate and rhythm and no murmurs, rubs or gallops. Peripheral pulses are normal and equal bilaterally without edema. No aortic or femoral bruits. Chest: symmetric with normal excursions and percussion.  Abdomen: Soft, with Nl bowel sounds. Nontender, no guarding, rebound, hernias, masses, or organomegaly.  Lymphatics: Non tender without lymphadenopathy.  Musculoskeletal: Full ROM all peripheral extremities, joint stability, 5/5 strength, and normal gait. Skin: Warm and dry without rashes, lesions, cyanosis, clubbing or  ecchymosis.  Neuro: Cranial nerves intact, reflexes equal bilaterally. Normal muscle tone, no cerebellar symptoms. Sensation intact.  Pysch: Alert and  oriented X 3 with normal affect, insight and judgment appropriate.   Assessment and Plan  1. Essential hypertension  - EKG 12-Lead - Urinalysis, Routine w reflex microscopic - Microalbumin / creatinine urine ratio - CBC with Differential/Platelet - COMPLETE METABOLIC PANEL WITH GFR - Magnesium - TSH - [place], RETROPERITNL ABD,  LTD  2. Hyperlipidemia, mixed  - EKG 12-Lead - Lipid panel - TSH - [place], RETROPERITNL ABD,  LTD  3. Type 2 diabetes mellitus with stage 2 chronic kidney disease, without long-term current use of insulin (HCC)  - EKG 12-Lead - Urinalysis, Routine w reflex microscopic - Microalbumin / creatinine urine ratio - HM DIABETES FOOT EXAM - LOW EXTREMITY NEUR EXAM DOCUM - Hemoglobin A1c - Insulin, random - [place], RETROPERITNL ABD,  LTD  4. Vitamin D deficiency  - VITAMIN D 25 Hydroxy  5. Coronary artery disease involving native coronary artery of native heart without angina pectoris  - EKG 12-Lead  6. Paroxismal atrial fibrillation  - EKG 12-Lead - TSH  7. Chronic gout  - Uric acid  8. Hypothyroidism  - TSH  9. OSA (obstructive sleep apnea)   10. 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 Screening for ischemic heart disease  - EKG 12-Lead  12. Screening for AAA (aortic abdominal aneurysm)  - [place], RETROPERITNL ABD,  LTD  13. Screening for colorectal cancer  - had colon 2019  14. BPH with obstruction/lower urinary tract symptoms  - PSA  15. Prostate cancer screening  - PSA  16. Medication management  - Urinalysis, Routine w reflex microscopic - Microalbumin / creatinine urine ratio - CBC with Differential/Platelet - COMPLETE METABOLIC PANEL WITH GFR - Magnesium - Lipid panel - TSH - Hemoglobin A1c - Insulin, random - VITAMIN D 25 Hydroxy - Uric acid - [place], RETROPERITNL ABD,  LTD         Patient was counseled in prudent diet, weight control to achieve/maintain BMI less than 25, BP monitoring, regular exercise and medications as discussed.  Discussed med effects and SE's. Routine screening labs and tests as requested with regular follow-up as recommended. I discussed the assessment and treatment plan as above with the patient. The patient was provided an opportunity to ask questions and all were answered. The patient agreed with the plan and demonstrated an understanding of the instructions.Over 40 minutes of exam, counseling, chart review and high complex critical decision making was performed         The patient was advised to call back or seek an in-person evaluation if the symptoms worsen or if the condition fails to improve as anticipated.       I provided  minutes of non-face-to-face time during this encounter and over 40 minutes of exam, counseling, chart review and  complex critical decision making was performed  Kirtland Bouchard, MD

## 2018-10-20 DIAGNOSIS — N138 Other obstructive and reflux uropathy: Secondary | ICD-10-CM | POA: Insufficient documentation

## 2018-10-20 DIAGNOSIS — H02834 Dermatochalasis of left upper eyelid: Secondary | ICD-10-CM | POA: Diagnosis not present

## 2018-10-20 DIAGNOSIS — N401 Enlarged prostate with lower urinary tract symptoms: Secondary | ICD-10-CM | POA: Insufficient documentation

## 2018-10-20 DIAGNOSIS — H2512 Age-related nuclear cataract, left eye: Secondary | ICD-10-CM | POA: Diagnosis not present

## 2018-10-20 DIAGNOSIS — H02831 Dermatochalasis of right upper eyelid: Secondary | ICD-10-CM | POA: Diagnosis not present

## 2018-10-20 DIAGNOSIS — Z961 Presence of intraocular lens: Secondary | ICD-10-CM | POA: Diagnosis not present

## 2018-10-20 DIAGNOSIS — H43391 Other vitreous opacities, right eye: Secondary | ICD-10-CM | POA: Diagnosis not present

## 2018-10-20 LAB — INSULIN, RANDOM: Insulin: 63.6 u[IU]/mL — ABNORMAL HIGH

## 2018-10-20 LAB — CBC WITH DIFFERENTIAL/PLATELET
Absolute Monocytes: 676 cells/uL (ref 200–950)
Basophils Absolute: 41 cells/uL (ref 0–200)
Basophils Relative: 0.6 %
Eosinophils Absolute: 138 cells/uL (ref 15–500)
Eosinophils Relative: 2 %
HCT: 44.1 % (ref 38.5–50.0)
Hemoglobin: 15 g/dL (ref 13.2–17.1)
Lymphs Abs: 1808 cells/uL (ref 850–3900)
MCH: 32.6 pg (ref 27.0–33.0)
MCHC: 34 g/dL (ref 32.0–36.0)
MCV: 95.9 fL (ref 80.0–100.0)
MPV: 10.6 fL (ref 7.5–12.5)
Monocytes Relative: 9.8 %
Neutro Abs: 4237 cells/uL (ref 1500–7800)
Neutrophils Relative %: 61.4 %
Platelets: 295 10*3/uL (ref 140–400)
RBC: 4.6 10*6/uL (ref 4.20–5.80)
RDW: 14.2 % (ref 11.0–15.0)
Total Lymphocyte: 26.2 %
WBC: 6.9 10*3/uL (ref 3.8–10.8)

## 2018-10-20 LAB — URINALYSIS, ROUTINE W REFLEX MICROSCOPIC
Bacteria, UA: NONE SEEN /HPF
Bilirubin Urine: NEGATIVE
Glucose, UA: NEGATIVE
Hgb urine dipstick: NEGATIVE
Hyaline Cast: NONE SEEN /LPF
Ketones, ur: NEGATIVE
Nitrite: NEGATIVE
Protein, ur: NEGATIVE
Specific Gravity, Urine: 1.019 (ref 1.001–1.03)
Squamous Epithelial / HPF: NONE SEEN /HPF (ref <=5)
pH: 6.5 (ref 5.0–8.0)

## 2018-10-20 LAB — COMPLETE METABOLIC PANEL WITH GFR
AG Ratio: 1.4 (calc) (ref 1.0–2.5)
ALT: 18 U/L (ref 9–46)
AST: 19 U/L (ref 10–35)
Albumin: 4.6 g/dL (ref 3.6–5.1)
Alkaline phosphatase (APISO): 116 U/L (ref 35–144)
BUN: 20 mg/dL (ref 7–25)
CO2: 23 mmol/L (ref 20–32)
Calcium: 9.7 mg/dL (ref 8.6–10.3)
Chloride: 105 mmol/L (ref 98–110)
Creat: 1.15 mg/dL (ref 0.70–1.25)
GFR, Est African American: 75 mL/min/{1.73_m2} (ref >=60)
GFR, Est Non African American: 65 mL/min/{1.73_m2} (ref >=60)
Globulin: 3.3 g/dL (calc) (ref 1.9–3.7)
Glucose, Bld: 168 mg/dL — ABNORMAL HIGH (ref 65–99)
Potassium: 4.2 mmol/L (ref 3.5–5.3)
Sodium: 142 mmol/L (ref 135–146)
Total Bilirubin: 0.8 mg/dL (ref 0.2–1.2)
Total Protein: 7.9 g/dL (ref 6.1–8.1)

## 2018-10-20 LAB — HEMOGLOBIN A1C
Hgb A1c MFr Bld: 6.8 % of total Hgb — ABNORMAL HIGH (ref <5.7)
Mean Plasma Glucose: 148 (calc)
eAG (mmol/L): 8.2 (calc)

## 2018-10-20 LAB — MICROALBUMIN / CREATININE URINE RATIO
Creatinine, Urine: 172 mg/dL (ref 20–320)
Microalb Creat Ratio: 31 mcg/mg creat — ABNORMAL HIGH (ref <30)
Microalb, Ur: 5.3 mg/dL

## 2018-10-20 LAB — LIPID PANEL
Cholesterol: 115 mg/dL (ref <200)
HDL: 34 mg/dL — ABNORMAL LOW (ref >=40)
LDL Cholesterol (Calc): 48 mg/dL (calc)
Non-HDL Cholesterol (Calc): 81 mg/dL (calc) (ref <130)
Total CHOL/HDL Ratio: 3.4 (calc) (ref <5.0)
Triglycerides: 279 mg/dL — ABNORMAL HIGH (ref <150)

## 2018-10-20 LAB — MAGNESIUM: Magnesium: 2.1 mg/dL (ref 1.5–2.5)

## 2018-10-20 LAB — PSA: PSA: 0.4 ng/mL (ref <=4.0)

## 2018-10-20 LAB — URIC ACID: Uric Acid, Serum: 6.1 mg/dL (ref 4.0–8.0)

## 2018-10-20 LAB — TSH: TSH: 3.21 mIU/L (ref 0.40–4.50)

## 2018-10-20 LAB — VITAMIN D 25 HYDROXY (VIT D DEFICIENCY, FRACTURES): Vit D, 25-Hydroxy: 58 ng/mL (ref 30–100)

## 2018-10-24 ENCOUNTER — Telehealth: Payer: Self-pay

## 2018-10-24 DIAGNOSIS — H2512 Age-related nuclear cataract, left eye: Secondary | ICD-10-CM | POA: Diagnosis not present

## 2018-10-24 NOTE — Telephone Encounter (Signed)

## 2018-10-24 NOTE — Telephone Encounter (Signed)
lmom for prescreen  

## 2018-10-26 ENCOUNTER — Ambulatory Visit (INDEPENDENT_AMBULATORY_CARE_PROVIDER_SITE_OTHER): Payer: Medicare Other | Admitting: *Deleted

## 2018-10-26 ENCOUNTER — Other Ambulatory Visit: Payer: Self-pay

## 2018-10-26 DIAGNOSIS — I4819 Other persistent atrial fibrillation: Secondary | ICD-10-CM | POA: Diagnosis not present

## 2018-10-26 DIAGNOSIS — Z5181 Encounter for therapeutic drug level monitoring: Secondary | ICD-10-CM | POA: Diagnosis not present

## 2018-10-26 LAB — POCT INR: INR: 2.3 (ref 2.0–3.0)

## 2018-10-26 NOTE — Patient Instructions (Addendum)
Description   Continue taking 1.5 tablets daily except 1 tablet on Sundays and Thursdays. Recheck INR in 1 week. [facility] Clinic: [phone] Main number: [phone]

## 2018-10-27 ENCOUNTER — Telehealth: Payer: Self-pay

## 2018-10-27 NOTE — Telephone Encounter (Signed)
lmom for prescreen  

## 2018-10-27 NOTE — Telephone Encounter (Signed)

## 2018-10-31 DIAGNOSIS — H5703 Miosis: Secondary | ICD-10-CM | POA: Diagnosis not present

## 2018-10-31 DIAGNOSIS — H2512 Age-related nuclear cataract, left eye: Secondary | ICD-10-CM | POA: Diagnosis not present

## 2018-10-31 DIAGNOSIS — H2181 Floppy iris syndrome: Secondary | ICD-10-CM | POA: Diagnosis not present

## 2018-11-03 ENCOUNTER — Ambulatory Visit (INDEPENDENT_AMBULATORY_CARE_PROVIDER_SITE_OTHER): Payer: Medicare Other | Admitting: *Deleted

## 2018-11-03 ENCOUNTER — Other Ambulatory Visit: Payer: Self-pay

## 2018-11-03 DIAGNOSIS — I4819 Other persistent atrial fibrillation: Secondary | ICD-10-CM | POA: Diagnosis not present

## 2018-11-03 DIAGNOSIS — Z5181 Encounter for therapeutic drug level monitoring: Secondary | ICD-10-CM

## 2018-11-03 LAB — POCT INR: INR: 2.7 (ref 2.0–3.0)

## 2018-11-03 NOTE — Patient Instructions (Signed)
Description   Continue taking 1.5 tablets daily except 1 tablet on Sundays and Thursdays. Recheck INR in 1 week. [facility]mad[facility] Clinic: [phone] Main number: [phone]

## 2018-11-10 ENCOUNTER — Ambulatory Visit (INDEPENDENT_AMBULATORY_CARE_PROVIDER_SITE_OTHER): Payer: Medicare Other

## 2018-11-10 ENCOUNTER — Other Ambulatory Visit: Payer: Self-pay

## 2018-11-10 ENCOUNTER — Encounter (INDEPENDENT_AMBULATORY_CARE_PROVIDER_SITE_OTHER): Payer: Self-pay

## 2018-11-10 DIAGNOSIS — I4819 Other persistent atrial fibrillation: Secondary | ICD-10-CM | POA: Diagnosis not present

## 2018-11-10 DIAGNOSIS — Z5181 Encounter for therapeutic drug level monitoring: Secondary | ICD-10-CM | POA: Diagnosis not present

## 2018-11-10 LAB — POCT INR: INR: 2.9 (ref 2.0–3.0)

## 2018-11-10 NOTE — Patient Instructions (Signed)
Description   Continue on same dosage 1.5 tablets daily except 1 tablet on Sundays and Thursdays. Recheck INR in 2 weeks. Coumadin Clinic: [phone] Main number: [phone]

## 2018-11-17 ENCOUNTER — Telehealth: Payer: Self-pay

## 2018-11-17 NOTE — Telephone Encounter (Signed)

## 2018-11-17 NOTE — Telephone Encounter (Signed)
1. COVID-19 Pre-Screening Questions:  . In the past 7 to 10 days have you had a cough,  shortness of breath, headache, congestion, fever (100 or greater) body aches, chills, sore throat, or sudden loss of taste or sense of smell? No . Have you been around anyone with known Covid 19? No . Have you been around anyone who is awaiting Covid 19 test results in the past 7 to 10 days? No . Have you been around anyone who has been exposed to Covid 19, or has mentioned symptoms of Covid 19 within the past 7 to 10 days? No    2. Pt advised of visitor restrictions (no visitors allowed except if needed to conduct the visit). Also advised to arrive at appointment time and wear a mask.   Pt states he received a message to call back for prescreening, after looking in the chart saw it was already done and he said he spoke to no one but had a msg to call Haleigh back, so I completed the prescreen and documented above. Will forward to [facility] as she documented prescreen was done.

## 2018-11-24 ENCOUNTER — Ambulatory Visit (INDEPENDENT_AMBULATORY_CARE_PROVIDER_SITE_OTHER): Payer: Medicare Other | Admitting: *Deleted

## 2018-11-24 ENCOUNTER — Other Ambulatory Visit: Payer: Self-pay

## 2018-11-24 ENCOUNTER — Encounter (INDEPENDENT_AMBULATORY_CARE_PROVIDER_SITE_OTHER): Payer: Self-pay

## 2018-11-24 DIAGNOSIS — Z5181 Encounter for therapeutic drug level monitoring: Secondary | ICD-10-CM

## 2018-11-24 DIAGNOSIS — I4819 Other persistent atrial fibrillation: Secondary | ICD-10-CM

## 2018-11-24 LAB — POCT INR: INR: 1.8 — AB (ref 2.0–3.0)

## 2018-11-24 NOTE — Patient Instructions (Signed)
Description    Take 1.5 tablets today, then continue on same dosage 1.5 tablets daily except 1 tablet on Sundays and Thursdays. Recheck INR in 2 weeks. [facility] Clinic: [phone] Main number: [phone]

## 2018-12-01 ENCOUNTER — Telehealth: Payer: Self-pay

## 2018-12-01 NOTE — Telephone Encounter (Signed)
lmom for prescreen  

## 2018-12-02 ENCOUNTER — Telehealth: Payer: Self-pay | Admitting: *Deleted

## 2018-12-02 NOTE — Telephone Encounter (Signed)

## 2018-12-06 ENCOUNTER — Other Ambulatory Visit: Payer: Self-pay | Admitting: Internal Medicine

## 2018-12-06 DIAGNOSIS — M1A9XX0 Chronic gout, unspecified, without tophus (tophi): Secondary | ICD-10-CM

## 2018-12-06 MED ORDER — ALLOPURINOL 300 MG PO TABS: ORAL_TABLET | ORAL | 3 refills | Status: DC

## 2018-12-08 ENCOUNTER — Ambulatory Visit (INDEPENDENT_AMBULATORY_CARE_PROVIDER_SITE_OTHER): Payer: Medicare Other | Admitting: *Deleted

## 2018-12-08 ENCOUNTER — Other Ambulatory Visit: Payer: Self-pay | Admitting: *Deleted

## 2018-12-08 ENCOUNTER — Other Ambulatory Visit: Payer: Self-pay

## 2018-12-08 DIAGNOSIS — M1A9XX0 Chronic gout, unspecified, without tophus (tophi): Secondary | ICD-10-CM

## 2018-12-08 DIAGNOSIS — Z5181 Encounter for therapeutic drug level monitoring: Secondary | ICD-10-CM | POA: Diagnosis not present

## 2018-12-08 DIAGNOSIS — I4819 Other persistent atrial fibrillation: Secondary | ICD-10-CM

## 2018-12-08 LAB — POCT INR: INR: 2.9 (ref 2.0–3.0)

## 2018-12-08 MED ORDER — ALLOPURINOL 300 MG PO TABS: ORAL_TABLET | ORAL | 3 refills | Status: DC

## 2018-12-08 NOTE — Patient Instructions (Signed)
Description   Continue on same dosage 1.5 tablets daily except 1 tablet on Sundays and Thursdays. Recheck INR in 3 weeks. [facility]: [phone] Main number: [phone]

## 2018-12-19 ENCOUNTER — Other Ambulatory Visit: Payer: Self-pay | Admitting: Cardiology

## 2018-12-26 ENCOUNTER — Telehealth: Payer: Self-pay | Admitting: *Deleted

## 2018-12-26 NOTE — Telephone Encounter (Signed)
Pt called to confirm his appt on 12/29/2018 at 1pm; prescreen completed  1. COVID-19 Pre-Screening Questions:  . In the past 7 to 10 days have you had a cough,  shortness of breath, headache, congestion, fever (100 or greater) body aches, chills, sore throat, or sudden loss of taste or sense of smell? No . Have you been around anyone with known Covid 19? No . Have you been around anyone who is awaiting Covid 19 test results in the past 7 to 10 days? No . Have you been around anyone who has been exposed to Covid 19, or has mentioned symptoms of Covid 19 within the past 7 to 10 days? No    2. Pt advised of visitor restrictions (no visitors allowed except if needed to conduct the visit). Also advised to arrive at appointment time and wear a mask.

## 2018-12-29 ENCOUNTER — Ambulatory Visit (INDEPENDENT_AMBULATORY_CARE_PROVIDER_SITE_OTHER): Payer: Medicare Other | Admitting: Pharmacist

## 2018-12-29 ENCOUNTER — Other Ambulatory Visit: Payer: Self-pay

## 2018-12-29 DIAGNOSIS — I4819 Other persistent atrial fibrillation: Secondary | ICD-10-CM | POA: Diagnosis not present

## 2018-12-29 DIAGNOSIS — Z5181 Encounter for therapeutic drug level monitoring: Secondary | ICD-10-CM | POA: Diagnosis not present

## 2018-12-29 LAB — POCT INR: INR: 1.7 — AB (ref 2.0–3.0)

## 2018-12-29 NOTE — Patient Instructions (Signed)
Description   Take 1.5 tablets today, then continue on same dosage 1.5 tablets daily except 1 tablet on Sundays and Thursdays. Recheck INR in 3 weeks. Coumad[facility] Clinic: [phone] Main number: [phone]

## 2019-01-13 ENCOUNTER — Other Ambulatory Visit: Payer: Self-pay | Admitting: Adult Health

## 2019-01-13 DIAGNOSIS — I10 Essential (primary) hypertension: Secondary | ICD-10-CM

## 2019-01-19 NOTE — Progress Notes (Signed)
FOLLOW UP  Assessment and Plan:   Afib rate controlled Doing well s/p ablation off AAD therapy Continue coumadin via cardiology for [facility] of [facility] Continue following up with cardio still unable to tolerate CPAP due to dryness.   CAD Control blood pressure, cholesterol, glucose, increase exercise.  Followed by cardiology  Hypertension Typically well controlled, last in office 120s, defer dose adjustment, recheck at next OV Monitor blood pressure at home; patient to call if consistently greater than 130/80 Continue DASH diet.   Reminder to go to the ER if any CP, SOB, nausea, dizziness, severe HA, changes vision/speech, left arm numbness and tingling and jaw pain.  Cholesterol Currently at LDL goal; continue statin - reduce from Lipitor 80 mg to 40 mg if remains aggressively controlled; diet for trigs discussed Continue low cholesterol diet and exercise.  Check lipid panel.   Diabetes with diabetic chronic kidney disease Continue medication: metformin 1500 mg daily - he will check with pharmacy regarding recall Continue diet and exercise.  Perform daily foot/skin check, notify office of any concerning changes.  Check A1C  Morbid obesity Long discussion about weight loss, diet, and exercise Recommended diet heavy in fruits and veggies and low in animal meats, cheeses, and dairy products, appropriate calorie intake Discussed ideal weight for height, goal <225lb Patient will work on eating more frequent small, meals, restart exercise with cooler weather, continue to monitor weight at least weekly  Will follow up in 3 months  Vitamin D Def At goal at last visit; continue supplementation to maintain goal of 70-100 Defer Vit D level  Gout Continue allopurinol Diet discussed Check uric acid as needed  Continue diet and meds as discussed. Further disposition pending results of labs. Discussed med's effects and SE's.   Over 30 minutes of exam, counseling, chart review, and  critical decision making was performed.   Future Appointments  Date Time Provider [place]  02/01/2019  9:20 AM [name], MD [facility]  02/20/2019  1:00 PM [facility]  04/25/2019  2:30 PM [name], MD [facility] None  08/01/2019  3:00 PM Garnet Sierras, NP [facility] None  11/02/2019  3:00 PM [name], MD [facility] None    ----------------------------------------------------------------------------------------------------------------------  HPI 68 y.o. male  presents for 3 month follow up on hypertension, cholesterol, diabetes, hypothyroid, morbid obesity and vitamin D deficiency.   Patient has OSA but is intolerant to the mask; weight loss has been emphasized to patient without much progress.   Patient has history of Afib (2016) and had CV x 2 (2016 & 2017) and then Jan 2018 had an Afib Ablation (Dr Curt Bears) and now on coumadin managed by coumadin clinic (couldn't afford xarelto) followed by Dr Fransico Him. He reports had checked today with no dose change recommendation.  BMI is Body mass index is 36.38 kg/m., he has been working on diet and exercise, not walking as much due to heat making him short of breath,  pushing vegetable intake. His weight is down from 270 lb, he has been pushing vegetable intake.  Wt Readings from Last 3 Encounters:  01/23/19 257 lb 3.2 oz (116.7 kg)  10/19/18 253 lb 3.2 oz (114.9 kg)  07/14/18 245 lb (111.1 kg)   His blood pressure has been controlled at home (typically 120-130s/70s), he has been off of olmesartan for a few days after running out, shipment should arrive today/tomorrow, today their BP is BP: (!) 146/80  He does workout. He denies chest pain, shortness of breath,  dizziness.   He is on cholesterol medication Atorvastatin 80 mg daily and denies myalgias. His cholesterol is at goal. The cholesterol last visit was:   Lab Results  Component Value Date   CHOL  115 10/19/2018   HDL 34 (L) 10/19/2018   LDLCALC 48 10/19/2018   TRIG 279 (H) 10/19/2018   CHOLHDL 3.4 10/19/2018    He has been working on diet and exercise for T2 diabetes on metformin 500 mg TID daily, and denies foot ulcerations, increased appetite, nausea, paresthesia of the feet, polydipsia and polyuria. He does check fasting sugars, ranging 100- 120. Last A1C in the office was:  Lab Results  Component Value Date   HGBA1C 6.8 (H) 10/19/2018   He is on thyroid medication. His medication was not changed last visit, taking 100 mcg daily Lab Results  Component Value Date   TSH 3.21 10/19/2018   Patient is on Vitamin D supplement.   Lab Results  Component Value Date   VD25OH 58 10/19/2018     Patient is on allopurinol for gout and does not report a recent flare.  Lab Results  Component Value Date   LABURIC 6.1 10/19/2018     Current Medications:  Current Outpatient Medications on File Prior to Visit  Medication Sig  . allopurinol (ZYLOPRIM) 300 MG tablet Take 1 tablet Daily to Prevent Gout  . amLODipine (NORVASC) 5 MG tablet Take 1 & 1/2 Daily for BP  . atorvastatin (LIPITOR) 80 MG tablet Take 1 tablet Daily for Cholesterol  . B Complex Vitamins (B COMPLEX PO) Take 1 tablet by mouth daily.  . carvedilol (COREG) 12.5 MG tablet Take 1 tablet (12.5 mg total) by mouth 2 (two) times daily.  . cholecalciferol (VITAMIN D) 1000 UNITS tablet Take 2,000 Units by mouth daily.  . colchicine 0.6 MG tablet Take 1 tablet daily to prevent Gout Attacks  . levothyroxine (SYNTHROID, LEVOTHROID) 100 MCG tablet Take 1 tablet daily on an empty stomach with only water for 30 minutes & no Antacid meds, Calcium or Magnesium for 4 hours & avoid Biotin  . loratadine (CLARITIN) 10 MG tablet Take 10 mg by mouth daily.  . magnesium gluconate (MAGONATE) 500 MG tablet Take 1,000 mg by mouth daily.   . metFORMIN (GLUCOPHAGE-XR) 500 MG 24 hr tablet TAKE 2 TABLETS BY MOUTH TWO TIMES DAILY FOR DIABETES  (Patient taking differently: Take 1,000 mg by mouth 2 (two) times daily. )  . olmesartan (BENICAR) 40 MG tablet Take 1 tablet Daily for BP  . warfarin (COUMADIN) 5 MG tablet TAKE 1 OR 1 AND 1/2 TABLETS BY MOUTH EVERY DAY AS DIRECTED BY COUMADIN CLINIC   No current facility-administered medications on file prior to visit.      Allergies:  Allergies  Allergen Reactions  . Amiodarone Other (See Comments)    Lung toxicity  . Bee Venom Other (See Comments)    Leg went numb     Medical History:  Past Medical History:  Diagnosis Date  . Arthritis    "joints get stiff" (06/11/2016)  . Bee sting allergy   . Bee sting allergy   . CAD (coronary artery disease), native coronary artery 02/12/2016   cath showing 50% LM, 40% distal LAD, 70% ost to prox LCs, 40% mid LAD and 95% mid to dist RCA s/p PCI with DES x 2 and stage PCI of the LCx/OM.  [name] Carotid artery stenosis    1-39% bilateral by dopplers 08/2017  . History of gout   .  Hx of adenomatous colonic polyps 04/10/2008  . Hyperlipidemia   . Hypertension   . Hypothyroidism   . OSA (obstructive sleep apnea)    "tried CPAP; couldn't do it" (06/11/2016)  . Persistent atrial fibrillation    s/p DCCV 03/2015 with reversion back to atrial fibrillation, loaded with Amio and s/p DCCV to NSR 11/2015.  He is now s/p afib albation.  He had amio lung toxicity and amio was stopped.  . Thyroid disease   . Type II diabetes mellitus ([place])   . Vitamin D deficiency    Family history- Reviewed and unchanged Social history- Reviewed and unchanged   Review of Systems:  Review of Systems  Constitutional: Negative for malaise/fatigue and weight loss.  HENT: Negative for hearing loss and tinnitus.   Eyes: Negative for blurred vision and double vision.  Respiratory: Negative for cough, shortness of breath and wheezing.   Cardiovascular: Negative for chest pain, palpitations, orthopnea, claudication and leg swelling.  Gastrointestinal: Negative for  abdominal pain, blood in stool, constipation, diarrhea, heartburn, melena, nausea and vomiting.  Genitourinary: Negative.   Musculoskeletal: Negative for joint pain and myalgias.  Skin: Negative for rash.  Neurological: Negative for dizziness, tingling, sensory change, weakness and headaches.  Endo/Heme/Allergies: Negative for polydipsia.  Psychiatric/Behavioral: Negative.   All other systems reviewed and are negative.     Physical Exam: BP (!) 146/80   Pulse 65   Temp 98.1 F (36.7 C)   Wt 257 lb 3.2 oz (116.7 kg)   SpO2 92%   BMI 36.38 kg/m  Wt Readings from Last 3 Encounters:  01/23/19 257 lb 3.2 oz (116.7 kg)  10/19/18 253 lb 3.2 oz (114.9 kg)  07/14/18 245 lb (111.1 kg)   General Appearance: Well nourished, in no apparent distress. Eyes: PERRLA, EOMs, conjunctiva no swelling or erythema Sinuses: No Frontal/maxillary tenderness ENT/Mouth: Ext aud canals clear, TMs without erythema, bulging. No erythema, swelling, or exudate on post pharynx.  Tonsils not swollen or erythematous. Hearing normal.  Neck: Supple, thyroid normal.  Respiratory: Respiratory effort normal, BS equal bilaterally without rales, rhonchi, wheezing or stridor.  Cardio: RRR with no MRGs. Brisk peripheral pulses without edema.  Abdomen: Soft, + BS.  Non tender, no guarding, rebound, hernias, masses. Lymphatics: Non tender without lymphadenopathy.  Musculoskeletal: Full ROM, 5/5 strength, Not tested gait Skin: Warm, dry without rashes, lesions, ecchymosis.  Neuro: Cranial nerves intact. No cerebellar symptoms.  Psych: Awake and oriented X 3, normal affect, Insight and Judgment appropriate.    Izora Ribas, NP 2:32 PM [facility]

## 2019-01-23 ENCOUNTER — Other Ambulatory Visit: Payer: Self-pay

## 2019-01-23 ENCOUNTER — Ambulatory Visit (INDEPENDENT_AMBULATORY_CARE_PROVIDER_SITE_OTHER): Payer: Medicare Other | Admitting: Adult Health

## 2019-01-23 ENCOUNTER — Encounter: Payer: Self-pay | Admitting: Adult Health

## 2019-01-23 ENCOUNTER — Ambulatory Visit (INDEPENDENT_AMBULATORY_CARE_PROVIDER_SITE_OTHER): Payer: Medicare Other

## 2019-01-23 VITALS — BP 146/80 | HR 65 | Temp 98.1°F | Wt 257.2 lb

## 2019-01-23 DIAGNOSIS — Z5181 Encounter for therapeutic drug level monitoring: Secondary | ICD-10-CM | POA: Diagnosis not present

## 2019-01-23 DIAGNOSIS — M1A9XX0 Chronic gout, unspecified, without tophus (tophi): Secondary | ICD-10-CM

## 2019-01-23 DIAGNOSIS — I2510 Atherosclerotic heart disease of native coronary artery without angina pectoris: Secondary | ICD-10-CM | POA: Diagnosis not present

## 2019-01-23 DIAGNOSIS — I4819 Other persistent atrial fibrillation: Secondary | ICD-10-CM

## 2019-01-23 DIAGNOSIS — E1169 Type 2 diabetes mellitus with other specified complication: Secondary | ICD-10-CM | POA: Diagnosis not present

## 2019-01-23 DIAGNOSIS — E669 Obesity, unspecified: Secondary | ICD-10-CM | POA: Diagnosis not present

## 2019-01-23 DIAGNOSIS — E1122 Type 2 diabetes mellitus with diabetic chronic kidney disease: Secondary | ICD-10-CM

## 2019-01-23 DIAGNOSIS — E079 Disorder of thyroid, unspecified: Secondary | ICD-10-CM

## 2019-01-23 DIAGNOSIS — E785 Hyperlipidemia, unspecified: Secondary | ICD-10-CM

## 2019-01-23 DIAGNOSIS — I10 Essential (primary) hypertension: Secondary | ICD-10-CM | POA: Diagnosis not present

## 2019-01-23 DIAGNOSIS — E66811 Obesity, class 1: Secondary | ICD-10-CM

## 2019-01-23 DIAGNOSIS — N182 Chronic kidney disease, stage 2 (mild): Secondary | ICD-10-CM

## 2019-01-23 LAB — POCT INR: INR: 2.2 (ref 2.0–3.0)

## 2019-01-23 NOTE — Patient Instructions (Signed)
Description   Continue on same dosage 1.5 tablets daily except 1 tablet on Sundays and Thursdays. Recheck INR in 4 weeks. Coumadin Clinic: [phone] Main number: [phone]

## 2019-01-23 NOTE — Patient Instructions (Addendum)
Goals    . Blood Pressure < 130/80    . Exercise 150 min/wk Moderate Activity    . Weight (lb) < 225 lb (102.1 kg)        Please call pharmacist and ask whether your lot of metformin was ok - if not we can switch to immediate release or do 750 mg extrended release 3 times a day    If you continue to struggle with weight loss, may need to consider eating smaller & more frequent meals - diabetics often do not tolerate long periods of fasting   Aim to eat small meals containing protein and fiber at least 3 times a day    Drink 1/2 your body weight in fluid ounces of water daily; drink a tall glass of water 30 min before meals  Don't eat until you're stuffed- listen to your stomach and eat until you are 80% full   Try eating off of a salad plate; wait 10 min after finishing before going back for seconds  Start by eating the vegetables on your plate; aim for 50% of your meals to be fruits or vegetables  Then eat your protein - lean meats (grass fed if possible), fish, beans, nuts in moderation  Eat your carbs/starch last ONLY if you still are hungry. If you can, stop before finishing it all  Avoid sugar and flour - the closer it looks to it's original form in nature, typically the better it is for you  Splurge in moderation - "assign" days when you get to splurge and have the "bad stuff" - I like to follow a 80% - 20% plan- "good" choices 80 % of the time, "bad" choices in moderation 20% of the time  Simple equation is: Calories out > calories in = weight loss - even if you eat the bad stuff, if you limit portions, you will still lose weight    Exercising to Lose Weight Exercise is structured, repetitive physical activity to improve fitness and health. Getting regular exercise is important for everyone. It is especially important if you are overweight. Being overweight increases your risk of heart disease, stroke, diabetes, high blood pressure, and several types of cancer. Reducing  your calorie intake and exercising can help you lose weight. Exercise is usually categorized as moderate or vigorous intensity. To lose weight, most people need to do a certain amount of moderate-intensity or vigorous-intensity exercise each week. Moderate-intensity exercise  Moderate-intensity exercise is any activity that gets you moving enough to burn at least three times more energy (calories) than if you were sitting. Examples of moderate exercise include:  Walking a mile in 15 minutes.  Doing light yard work.  Biking at an easy pace. Most people should get at least 150 minutes (2 hours and 30 minutes) a week of moderate-intensity exercise to maintain their body weight. Vigorous-intensity exercise Vigorous-intensity exercise is any activity that gets you moving enough to burn at least six times more calories than if you were sitting. When you exercise at this intensity, you should be working hard enough that you are not able to carry on a conversation. Examples of vigorous exercise include:  Running.  Playing a team sport, such as football, basketball, and soccer.  Jumping rope. Most people should get at least 75 minutes (1 hour and 15 minutes) a week of vigorous-intensity exercise to maintain their body weight. How can exercise affect me? When you exercise enough to burn more calories than you eat, you lose weight. Exercise also  reduces body fat and builds muscle. The more muscle you have, the more calories you burn. Exercise also:  Improves mood.  Reduces stress and tension.  Improves your overall fitness, flexibility, and endurance.  Increases bone strength. The amount of exercise you need to lose weight depends on:  Your age.  The type of exercise.  Any health conditions you have.  Your overall physical ability. Talk to your health care provider about how much exercise you need and what types of activities are safe for you. What actions can I take to lose  weight? Nutrition   Make changes to your diet as told by your health care provider or diet and nutrition specialist (dietitian). This may include: ? Eating fewer calories. ? Eating more protein. ? Eating less unhealthy fats. ? Eating a diet that includes fresh fruits and vegetables, whole grains, low-fat dairy products, and lean protein. ? Avoiding foods with added fat, salt, and sugar.  Drink plenty of water while you exercise to prevent dehydration or heat stroke. Activity  Choose an activity that you enjoy and set realistic goals. Your health care provider can help you make an exercise plan that works for you.  Exercise at a moderate or vigorous intensity most days of the week. ? The intensity of exercise may vary from person to person. You can tell how intense a workout is for you by paying attention to your breathing and heartbeat. Most people will notice their breathing and heartbeat get faster with more intense exercise.  Do resistance training twice each week, such as: ? Push-ups. ? Sit-ups. ? Lifting weights. ? Using resistance bands.  Getting short amounts of exercise can be just as helpful as long structured periods of exercise. If you have trouble finding time to exercise, try to include exercise in your daily routine. ? Get up, stretch, and walk around every 30 minutes throughout the day. ? Go for a walk during your lunch break. ? Park your car farther away from your destination. ? If you take public transportation, get off one stop early and walk the rest of the way. ? Make phone calls while standing up and walking around. ? Take the stairs instead of elevators or escalators.  Wear comfortable clothes and shoes with good support.  Do not exercise so much that you hurt yourself, feel dizzy, or get very short of breath. Where to find more information  [place] Department of Health and Human Services: [employer]at  Centers for Disease Control and Prevention (CDC):  http://www.wolf.info/ Contact a health care provider:  Before starting a new exercise program.  If you have questions or concerns about your weight.  If you have a medical problem that keeps you from exercising. Get help right away if you have any of the following while exercising:  Injury.  Dizziness.  Difficulty breathing or shortness of breath that does not go away when you stop exercising.  Chest pain.  Rapid heartbeat. Summary  Being overweight increases your risk of heart disease, stroke, diabetes, high blood pressure, and several types of cancer.  Losing weight happens when you burn more calories than you eat.  Reducing the amount of calories you eat in addition to getting regular moderate or vigorous exercise each week helps you lose weight. This information is not intended to replace advice given to you by your health care provider. Make sure you discuss any questions you have with your health care provider. Document Released: 06/13/2010 Document Revised: 05/24/2017 Document Reviewed: 05/24/2017 Elsevier Patient Education  2020 [employer]  

## 2019-01-24 ENCOUNTER — Other Ambulatory Visit: Payer: Self-pay

## 2019-01-24 ENCOUNTER — Other Ambulatory Visit: Payer: Self-pay | Admitting: Adult Health

## 2019-01-24 DIAGNOSIS — N182 Chronic kidney disease, stage 2 (mild): Secondary | ICD-10-CM

## 2019-01-24 DIAGNOSIS — E1122 Type 2 diabetes mellitus with diabetic chronic kidney disease: Secondary | ICD-10-CM

## 2019-01-24 LAB — COMPLETE METABOLIC PANEL WITH GFR
AG Ratio: 1.3 (calc) (ref 1.0–2.5)
ALT: 20 U/L (ref 9–46)
AST: 19 U/L (ref 10–35)
Albumin: 4.3 g/dL (ref 3.6–5.1)
Alkaline phosphatase (APISO): 116 U/L (ref 35–144)
BUN: 15 mg/dL (ref 7–25)
CO2: 27 mmol/L (ref 20–32)
Calcium: 9.9 mg/dL (ref 8.6–10.3)
Chloride: 106 mmol/L (ref 98–110)
Creat: 1.19 mg/dL (ref 0.70–1.25)
GFR, Est African American: 72 mL/min/{1.73_m2} (ref >=60)
GFR, Est Non African American: 62 mL/min/{1.73_m2} (ref >=60)
Globulin: 3.3 g/dL (calc) (ref 1.9–3.7)
Glucose, Bld: 111 mg/dL — ABNORMAL HIGH (ref 65–99)
Potassium: 4.4 mmol/L (ref 3.5–5.3)
Sodium: 144 mmol/L (ref 135–146)
Total Bilirubin: 0.8 mg/dL (ref 0.2–1.2)
Total Protein: 7.6 g/dL (ref 6.1–8.1)

## 2019-01-24 LAB — LIPID PANEL
Cholesterol: 109 mg/dL (ref <200)
HDL: 41 mg/dL (ref >=40)
LDL Cholesterol (Calc): 47 mg/dL (calc)
Non-HDL Cholesterol (Calc): 68 mg/dL (calc) (ref <130)
Total CHOL/HDL Ratio: 2.7 (calc) (ref <5.0)
Triglycerides: 128 mg/dL (ref <150)

## 2019-01-24 LAB — HEMOGLOBIN A1C
Hgb A1c MFr Bld: 7.3 % of total Hgb — ABNORMAL HIGH (ref <5.7)
Mean Plasma Glucose: 163 (calc)
eAG (mmol/L): 9 (calc)

## 2019-01-24 LAB — CBC WITH DIFFERENTIAL/PLATELET
Absolute Monocytes: 692 cells/uL (ref 200–950)
Basophils Absolute: 30 cells/uL (ref 0–200)
Basophils Relative: 0.4 %
Eosinophils Absolute: 312 cells/uL (ref 15–500)
Eosinophils Relative: 4.1 %
HCT: 43.8 % (ref 38.5–50.0)
Hemoglobin: 14.8 g/dL (ref 13.2–17.1)
Lymphs Abs: 1573 cells/uL (ref 850–3900)
MCH: 31.9 pg (ref 27.0–33.0)
MCHC: 33.8 g/dL (ref 32.0–36.0)
MCV: 94.4 fL (ref 80.0–100.0)
MPV: 10.1 fL (ref 7.5–12.5)
Monocytes Relative: 9.1 %
Neutro Abs: 4993 cells/uL (ref 1500–7800)
Neutrophils Relative %: 65.7 %
Platelets: 263 10*3/uL (ref 140–400)
RBC: 4.64 10*6/uL (ref 4.20–5.80)
RDW: 14.8 % (ref 11.0–15.0)
Total Lymphocyte: 20.7 %
WBC: 7.6 10*3/uL (ref 3.8–10.8)

## 2019-01-24 LAB — MAGNESIUM: Magnesium: 1.7 mg/dL (ref 1.5–2.5)

## 2019-01-24 LAB — TSH: TSH: 2.27 mIU/L (ref 0.40–4.50)

## 2019-01-24 MED ORDER — ATORVASTATIN CALCIUM 40 MG PO TABS: ORAL_TABLET | ORAL | 1 refills | Status: DC

## 2019-01-24 MED ORDER — METFORMIN HCL ER 500 MG PO TB24: ORAL_TABLET | ORAL | 1 refills | Status: DC

## 2019-01-25 ENCOUNTER — Telehealth: Payer: Self-pay | Admitting: *Deleted

## 2019-01-25 NOTE — Telephone Encounter (Signed)

## 2019-01-27 ENCOUNTER — Telehealth: Payer: Self-pay

## 2019-01-27 NOTE — Telephone Encounter (Signed)

## 2019-01-31 NOTE — Progress Notes (Signed)
Virtual Visit via Video Note   This visit type was conducted due to national recommendations for restrictions regarding the COVID-19 Pandemic (e.g. social distancing) in an effort to limit this patient's exposure and mitigate transmission in our community.  Due to his co-morbid illnesses, this patient is at least at moderate risk for complications without adequate follow up.  This format is felt to be most appropriate for this patient at this time.  All issues noted in this document were discussed and addressed.  A limited physical exam was performed with this format.  Please refer to the patient's chart for his consent to telehealth for [facility].   Evaluation Performed:  Follow-up visit  This visit type was conducted due to national recommendations for restrictions regarding the COVID-19 Pandemic (e.g. social distancing).  This format is felt to be most appropriate for this patient at this time.  All issues noted in this document were discussed and addressed.  No physical exam was performed (except for noted visual exam findings with Video Visits).  Please refer to the patient's chart (MyChart message for video visits and phone note for telephone visits) for the patient's consent to telehealth for [facility].  Date:  02/01/2019   ID:  [name], DOB January 06, 1951, MRN [id number]  Patient Location:  Home  Provider location:   [place]  PCP:  [name], MD  Cardiologist:  Fransico Him, MD  Electrophysiologist:  Constance Haw, MD   Chief Complaint:  Afib, HTN, CAD, HLD  History of Present Illness:    OC LEMMEN is a 68 y.o. male who presents via audio/video conferencing for a telehealth visit today.    WAH DILDINE is a 68 y.o. male with a hx of persistentatrial fibrillation s/p DCCV11/2016 to NSR but before discharge he went back into afib.He was loaded with Amio and then he was set up to have DCCV which he successfully had done in July 2017. He  ultimately underwent afib ablation 06/11/2016. He was found to have early lung toxicity and his Amio was stopped.He also has a history of HTN, dyslipidemia, DM, ASCAD cath showing 50% LM, 40% distal LAD, 70% ost to prox LCX, 40% mid LAD and 95% mid to dist RCA s/p PCI with DES x 2 and stage PCI of the LCx/OM.  He is here today for followup and is doing well.  He denies any chest pain or pressure, SOB, DOE, PND, orthopnea, LE edema, dizziness, palpitations or syncope. He is compliant with his meds and is tolerating meds with no SE.    The patient does not have symptoms concerning for COVID-19 infection (fever, chills, cough, or new shortness of breath).   Prior CV studies:   The following studies were reviewed today:  none  Past Medical History:  Diagnosis Date  . Arthritis    "joints get stiff" (06/11/2016)  . Bee sting allergy   . Bee sting allergy   . CAD (coronary artery disease), native coronary artery 02/12/2016   cath showing 50% LM, 40% distal LAD, 70% ost to prox LCs, 40% mid LAD and 95% mid to dist RCA s/p PCI with DES x 2 and stage PCI of the LCx/OM.  [name] Carotid artery stenosis    1-39% bilateral by dopplers 08/2017  . History of gout   . Hx of adenomatous colonic polyps 04/10/2008  . Hyperlipidemia   . Hypertension   . Hypothyroidism   . OSA (obstructive sleep apnea)    "tried CPAP; couldn't  do it" (06/11/2016)  . Persistent atrial fibrillation    s/p DCCV 03/2015 with reversion back to atrial fibrillation, loaded with Amio and s/p DCCV to NSR 11/2015.  He is now s/p afib albation.  He had amio lung toxicity and amio was stopped.  . Thyroid disease   . Type II diabetes mellitus ([place])   . Vitamin D deficiency    Past Surgical History:  Procedure Laterality Date  . CARDIAC CATHETERIZATION N/A 10/11/2015   Procedure: Left Heart Cath and Coronary Angiography;  Surgeon: [name], MD;  Location: [place] [facility];  Service: Cardiovascular;  Laterality: N/A;  . CARDIAC  CATHETERIZATION N/A 10/11/2015   Procedure: Coronary Stent Intervention;  Surgeon: [name], MD;  Location: [place] [facility];  Service: Cardiovascular;  Laterality: N/A;  . CARDIAC CATHETERIZATION N/A 10/11/2015   Procedure: Intravascular Pressure Wire/FFR Study;  Surgeon: [name], MD;  Location: [place] [facility];  Service: Cardiovascular;  Laterality: N/A;  . CARDIAC CATHETERIZATION N/A 10/14/2015   Procedure: Coronary Stent Intervention;  Surgeon: [name], MD;  Location: [place] [facility];  Service: Cardiovascular;  Laterality: N/A;  . CARDIOVERSION N/A 04/05/2015   Procedure: CARDIOVERSION;  Surgeon: [name], MD;  Location: [place] [facility];  Service: Cardiovascular;  Laterality: N/A;  . CARDIOVERSION N/A 12/09/2015   Procedure: CARDIOVERSION;  Surgeon: [name], MD;  Location: [place] [facility];  Service: Cardiovascular;  Laterality: N/A;  . CARDIOVERSION N/A 03/16/2016   Procedure: CARDIOVERSION;  Surgeon: [name], MD;  Location: [place];  Service: Cardiovascular;  Laterality: N/A;  . CATARACT EXTRACTION Right 2019   Dr. Patrici Ranks  . CORONARY ANGIOPLASTY    . ELECTROPHYSIOLOGIC STUDY N/A 06/11/2016   Procedure: Atrial Fibrillation Ablation;  Surgeon: Will [name], MD;  Location: [place] [facility];  Service: Cardiovascular;  Laterality: N/A;  . TOTAL HIP ARTHROPLASTY Bilateral 2001  . UVULECTOMY       Current Meds  Medication Sig  . allopurinol (ZYLOPRIM) 300 MG tablet Take 1 tablet Daily to Prevent Gout  . amLODipine (NORVASC) 5 MG tablet Take 1 & 1/2 Daily for BP  . atorvastatin (LIPITOR) 40 MG tablet Take 1 tablet Daily for Cholesterol  . B Complex Vitamins (B COMPLEX PO) Take 1 tablet by mouth daily.  . carvedilol (COREG) 12.5 MG tablet Take 1 tablet (12.5 mg total) by mouth 2 (two) times daily.  . cholecalciferol (VITAMIN D) 1000 UNITS tablet Take 2,000 Units by mouth daily.  . colchicine 0.6 MG tablet Take 1 tablet daily to  prevent Gout Attacks  . levothyroxine (SYNTHROID, LEVOTHROID) 100 MCG tablet Take 1 tablet daily on an empty stomach with only water for 30 minutes & no Antacid meds, Calcium or Magnesium for 4 hours & avoid Biotin  . loratadine (CLARITIN) 10 MG tablet Take 10 mg by mouth daily.  . magnesium gluconate (MAGONATE) 500 MG tablet Take 1,000 mg by mouth daily.   . metFORMIN (GLUCOPHAGE-XR) 500 MG 24 hr tablet TAKE 2 TABLETS BY MOUTH TWO TIMES DAILY FOR DIABETES  . olmesartan (BENICAR) 40 MG tablet Take 1 tablet Daily for BP  . warfarin (COUMADIN) 5 MG tablet TAKE 1 OR 1 AND 1/2 TABLETS BY MOUTH EVERY DAY AS DIRECTED BY COUMADIN CLINIC     Allergies:   Amiodarone and Bee venom   Social History   Tobacco Use  . Smoking status: Never Smoker  . Smokeless tobacco: Never Used  Substance Use Topics  . Alcohol use:  No  . Drug use: No     Family Hx: The patient's family history includes Colon cancer in his sister; Lung cancer in his father and mother.  ROS:   Please see the history of present illness.     All other systems reviewed and are negative.   Labs/Other Tests and Data Reviewed:    Recent Labs: 01/23/2019: ALT 20; BUN 15; Creat 1.19; Hemoglobin 14.8; Magnesium 1.7; Platelets 263; Potassium 4.4; Sodium 144; TSH 2.27   Recent Lipid Panel Lab Results  Component Value Date/Time   CHOL 109 01/23/2019 12:00 AM   TRIG 128 01/23/2019 12:00 AM   HDL 41 01/23/2019 12:00 AM   CHOLHDL 2.7 01/23/2019 12:00 AM   LDLCALC 47 01/23/2019 12:00 AM    Wt Readings from Last 3 Encounters:  02/01/19 251 lb (113.9 kg)  01/23/19 257 lb 3.2 oz (116.7 kg)  10/19/18 253 lb 3.2 oz (114.9 kg)     Objective:    Vital Signs:  BP 130/74   Pulse 64   Ht 6' (1.829 m)   Wt 251 lb (113.9 kg)   BMI 34.04 kg/m    CONSTITUTIONAL:  Well nourished, well developed male in no acute distress.  EYES: anicteric MOUTH: oral mucosa is pink RESPIRATORY: Normal respiratory effort, symmetric expansion  CARDIOVASCULAR: No peripheral edema SKIN: No rash, lesions or ulcers MUSCULOSKELETAL: no digital cyanosis NEURO: Cranial Nerves II-XII grossly intact, moves all extremities PSYCH: Intact judgement and insight.  A&O x 3, Mood/affect appropriate   ASSESSMENT & PLAN:    1.  ASCAD  -cath 2017 showed 50% LM, 40% distal LAD, 70% ost to prox LCX, 40% mid LAD and 95% mid to dist RCA s/p PCI with DES x 2 and stage PCI of the LCx/OM.  -no anginal sx since I saw him last -continue BB and statin  -no ASA due to DOAC   2.  HTN  -BP controlled  -continue Benicar 40 mg daily, carvedilol 12.5 mg twice daily and amlodipine 7.5 mg daily.   -creatinine 1.19 and K+ 4.4 last month  3.  Persistent atrial fibrillation  -he is status post [name] ablation in 2018  -amiodarone stopped due to possible lung toxicity.  -he has not had any palpitations and thinks he is in NSR. -continue Carvedilol and Xarelto 20mg  daily.  -Hbg normal at 14.8 with no bleeding problems   4.  Bilateral carotid artery stenosis  -Dopplers 09/16/2017 showed 1 to 39% bilateral stenosis.   -continue statin  5.  Hyperlipidemia  -LDL goal is less than 70.   -LDL 47 last month.  -His PCP cut his atorva back because his LDL was good.  I told him to go back on the 80mg  daily so we could keep his LDL below 70  6.  Chronic kidney disease stage III  -his last creatinine was stable at 1.19 and this was followed by his PCP.   COVID-19 Education: The signs and symptoms of COVID-19 were discussed with the patient and how to seek care for testing (follow up with PCP or arrange E-visit).  The importance of social distancing was discussed today.  Patient Risk:   After full review of this patient's clinical status, I feel that they are at least moderate risk at this time.  Time:   Today, I have spent 20 minutes directly with the patient on telemedicine discussing medical problems including CAD, HTN, HLD, PAF.  We also reviewed the  symptoms of COVID 19 and the ways to  protect against contracting the virus with telehealth technology.  I spent an additional 5 minutes reviewing patient's chart including labs.  Medication Adjustments/Labs and Tests Ordered: Current medicines are reviewed at length with the patient today.  Concerns regarding medicines are outlined above.  Tests Ordered: No orders of the defined types were placed in this encounter.  Medication Changes: No orders of the defined types were placed in this encounter.   Disposition:  Follow up in 1 year(s)  Signed, Fransico Him, MD  02/01/2019 9:20 AM    [facility]

## 2019-02-01 ENCOUNTER — Other Ambulatory Visit: Payer: Self-pay

## 2019-02-01 ENCOUNTER — Telehealth (INDEPENDENT_AMBULATORY_CARE_PROVIDER_SITE_OTHER): Payer: Medicare Other | Admitting: Cardiology

## 2019-02-01 VITALS — BP 130/74 | HR 64 | Ht 72.0 in | Wt 251.0 lb

## 2019-02-01 DIAGNOSIS — I10 Essential (primary) hypertension: Secondary | ICD-10-CM

## 2019-02-01 DIAGNOSIS — I4819 Other persistent atrial fibrillation: Secondary | ICD-10-CM | POA: Diagnosis not present

## 2019-02-01 DIAGNOSIS — I2510 Atherosclerotic heart disease of native coronary artery without angina pectoris: Secondary | ICD-10-CM

## 2019-02-01 DIAGNOSIS — E785 Hyperlipidemia, unspecified: Secondary | ICD-10-CM | POA: Diagnosis not present

## 2019-02-01 DIAGNOSIS — N183 Chronic kidney disease, stage 3 unspecified: Secondary | ICD-10-CM

## 2019-02-01 MED ORDER — ATORVASTATIN CALCIUM 80 MG PO TABS: 80.0000 mg | ORAL_TABLET | Freq: Every day | ORAL | 3 refills | Status: DC

## 2019-02-01 NOTE — Patient Instructions (Signed)
Medication Instructions:  1) INCREASE LIPITOR (atorvastatin) to 80 mg daily.   Labwork: None  Testing/Procedures: None  Follow-Up: Your provider wants you to follow-up in: 1 year with [name]. You will receive a reminder letter in the mail two months in advance. If you don't receive a letter, please call our office to schedule the follow-up appointment.

## 2019-02-20 ENCOUNTER — Other Ambulatory Visit: Payer: Self-pay

## 2019-02-20 ENCOUNTER — Ambulatory Visit (INDEPENDENT_AMBULATORY_CARE_PROVIDER_SITE_OTHER): Payer: Medicare Other

## 2019-02-20 DIAGNOSIS — Z5181 Encounter for therapeutic drug level monitoring: Secondary | ICD-10-CM

## 2019-02-20 DIAGNOSIS — I4819 Other persistent atrial fibrillation: Secondary | ICD-10-CM | POA: Diagnosis not present

## 2019-02-20 LAB — POCT INR: INR: 3.3 — AB (ref 2.0–3.0)

## 2019-02-20 NOTE — Patient Instructions (Signed)
Description   Skip today's dosage of Coumadin, then resume same dosage 1.5 tablets daily except 1 tablet on Sundays and Thursdays. Recheck INR in 4 weeks. Coumadin Clinic: [phone] Main number: [phone]

## 2019-03-20 ENCOUNTER — Other Ambulatory Visit: Payer: Self-pay

## 2019-03-20 ENCOUNTER — Ambulatory Visit (INDEPENDENT_AMBULATORY_CARE_PROVIDER_SITE_OTHER): Payer: Medicare Other

## 2019-03-20 VITALS — Temp 97.5°F

## 2019-03-20 DIAGNOSIS — Z5181 Encounter for therapeutic drug level monitoring: Secondary | ICD-10-CM

## 2019-03-20 DIAGNOSIS — I4819 Other persistent atrial fibrillation: Secondary | ICD-10-CM

## 2019-03-20 DIAGNOSIS — Z23 Encounter for immunization: Secondary | ICD-10-CM

## 2019-03-20 LAB — POCT INR: INR: 2.2 (ref 2.0–3.0)

## 2019-03-20 NOTE — Patient Instructions (Signed)
Description   Continue on same dosage 1.5 tablets daily except 1 tablet on Sundays and Thursdays. Recheck INR in 4 weeks. Coumadin Clinic: [phone] Main number: [phone]

## 2019-03-20 NOTE — Progress Notes (Signed)
REPORTS for HD flu

## 2019-04-17 ENCOUNTER — Ambulatory Visit (INDEPENDENT_AMBULATORY_CARE_PROVIDER_SITE_OTHER): Payer: Medicare Other

## 2019-04-17 ENCOUNTER — Other Ambulatory Visit: Payer: Self-pay

## 2019-04-17 DIAGNOSIS — Z5181 Encounter for therapeutic drug level monitoring: Secondary | ICD-10-CM | POA: Diagnosis not present

## 2019-04-17 DIAGNOSIS — I4819 Other persistent atrial fibrillation: Secondary | ICD-10-CM

## 2019-04-17 LAB — POCT INR: INR: 2.4 (ref 2.0–3.0)

## 2019-04-17 NOTE — Patient Instructions (Signed)
Description   Continue on same dosage 1.5 tablets daily except 1 tablet on Sundays and Thursdays. Recheck INR in 5 weeks. Coumadin Clinic: [phone] Main number: [phone]

## 2019-04-24 ENCOUNTER — Other Ambulatory Visit: Payer: Self-pay | Admitting: Cardiology

## 2019-04-24 NOTE — Progress Notes (Signed)
History of Present Illness:      This very nice 68 y.o. single WM presents for 3 month follow up with HTN, ASHD/pAfib, HLD, T2_NIDDM and Vitamin D Deficiency.  Patient has hx/o Gout quiescent on his meds. Patient has hx/o OSA alleging mask intolerance.       Patient is treated for HTN (2006)  & BP has been controlled at home. Today's BP uis at goal - 140/82.  In 2016, he was dx'd with pAfib and failed CV in 2016 & 2017.   In Jan 2018, he had an ablation.  Patient has hx/o Amiodarone associated Pulmonary toxicity.   Patient is followed at [facility] clinics. has had no complaints of any cardiac type chest pain, palpitations, dyspnea / orthopnea / PND, dizziness, claudication, or dependent edema.      Hyperlipidemia is controlled with diet & Atorvastatin. Patient denies myalgias or other med SE's. Last Lipids were at goal:  Lab Results  Component Value Date   CHOL 109 01/23/2019   HDL 41 01/23/2019   LDLCALC 47 01/23/2019   TRIG 128 01/23/2019   CHOLHDL 2.7 01/23/2019        Patient was dx'd Hypothyroid in 2011 and initiated on Thyroid Replacement.       Also, the patient has Morbid Obesity (BMI 35+) and history of T2_NIDDM (2012)  And is on Metformin. Patient denies symptoms of reactive hypoglycemia, diabetic polys, paresthesias or visual blurring.  Last A1c was not at goal:  Lab Results  Component Value Date   HGBA1C 7.3 (H) 01/23/2019        Further, the patient also has history of Vitamin D Deficiency ("23" / 2009)  and supplements vitamin D without any suspected side-effects. Last vitamin D was near goal (70-100):  Lab Results  Component Value Date   VD25OH 58 10/19/2018    Current Outpatient Medications on File Prior to Visit  Medication Sig  . allopurinol (ZYLOPRIM) 300 MG tablet Take 1 tablet Daily to Prevent Gout  . amLODipine (NORVASC) 5 MG tablet Take 1 & 1/2 Daily for BP  . atorvastatin (LIPITOR) 80 MG tablet Take 1 tablet (80 mg total) by mouth daily  at 6 PM.  . B Complex Vitamins (B COMPLEX PO) Take 1 tablet by mouth daily.  . carvedilol (COREG) 12.5 MG tablet Take 1 tablet (12.5 mg total) by mouth 2 (two) times daily.  . cholecalciferol (VITAMIN D) 1000 UNITS tablet Take 2,000 Units by mouth daily.  . colchicine 0.6 MG tablet Take 1 tablet daily to prevent Gout Attacks  . levothyroxine (SYNTHROID, LEVOTHROID) 100 MCG tablet Take 1 tablet daily on an empty stomach with only water for 30 minutes & no Antacid meds, Calcium or Magnesium for 4 hours & avoid Biotin  . loratadine (CLARITIN) 10 MG tablet Take 10 mg by mouth daily.  . magnesium gluconate (MAGONATE) 500 MG tablet Take 1,000 mg by mouth daily.   . metFORMIN (GLUCOPHAGE-XR) 500 MG 24 hr tablet TAKE 2 TABLETS BY MOUTH TWO TIMES DAILY FOR DIABETES  . olmesartan (BENICAR) 40 MG tablet Take 1 tablet Daily for BP  . warfarin (COUMADIN) 5 MG tablet TAKE 1 OR 1 AND 1/2 TABLET BY MOUTH EVERY DAY OR AS DIRECTED BY COUMADIN CLINIC   No current facility-administered medications on file prior to visit.     Allergies  Allergen Reactions  . Amiodarone Other (See Comments)    Lung toxicity  . Bee Venom Other (See Comments)  Leg went numb    PMHx:   Past Medical History:  Diagnosis Date  . Arthritis    "joints get stiff" (06/11/2016)  . Bee sting allergy   . Bee sting allergy   . CAD (coronary artery disease), native coronary artery 02/12/2016   cath showing 50% LM, 40% distal LAD, 70% ost to prox LCs, 40% mid LAD and 95% mid to dist RCA s/p PCI with DES x 2 and stage PCI of the LCx/OM.  [name] Carotid artery stenosis    1-39% bilateral by dopplers 08/2017  . History of gout   . Hx of adenomatous colonic polyps 04/10/2008  . Hyperlipidemia   . Hypertension   . Hypothyroidism   . OSA (obstructive sleep apnea)    "tried CPAP; couldn't do it" (06/11/2016)  . Persistent atrial fibrillation ([place])    s/p DCCV 03/2015 with reversion back to atrial fibrillation, loaded with Amio and s/p DCCV  to NSR 11/2015.  He is now s/p afib albation.  He had amio lung toxicity and amio was stopped.  . Thyroid disease   . Type II diabetes mellitus ([place])   . Vitamin D deficiency    Immunization History  Administered Date(s) Administered  . Influenza Split 04/05/2014  . Influenza, High Dose Seasonal PF 03/31/2018, 03/20/2019  . Influenza-Unspecified 02/02/2013, 05/21/2016  . Pneumococcal Conjugate-13 05/01/2014  . Pneumococcal Polysaccharide-23 05/16/2015  . Td 04/25/2010  . Varicella Zoster Immune Globulin 02/22/2014   Past Surgical History:  Procedure Laterality Date  . CARDIAC CATHETERIZATION N/A 10/11/2015   Procedure: Left Heart Cath and Coronary Angiography;  Surgeon: [name], MD;  Location: [place] [facility];  Service: Cardiovascular;  Laterality: N/A;  . CARDIAC CATHETERIZATION N/A 10/11/2015   Procedure: Coronary Stent Intervention;  Surgeon: [name], MD;  Location: [place] [facility];  Service: Cardiovascular;  Laterality: N/A;  . CARDIAC CATHETERIZATION N/A 10/11/2015   Procedure: Intravascular Pressure Wire/FFR Study;  Surgeon: [name], MD;  Location: [place] [facility];  Service: Cardiovascular;  Laterality: N/A;  . CARDIAC CATHETERIZATION N/A 10/14/2015   Procedure: Coronary Stent Intervention;  Surgeon: [name], MD;  Location: [place] [facility];  Service: Cardiovascular;  Laterality: N/A;  . CARDIOVERSION N/A 04/05/2015   Procedure: CARDIOVERSION;  Surgeon: [name], MD;  Location: [place] [facility];  Service: Cardiovascular;  Laterality: N/A;  . CARDIOVERSION N/A 12/09/2015   Procedure: CARDIOVERSION;  Surgeon: [name], MD;  Location: [place] [facility];  Service: Cardiovascular;  Laterality: N/A;  . CARDIOVERSION N/A 03/16/2016   Procedure: CARDIOVERSION;  Surgeon: [name], MD;  Location: [place];  Service: Cardiovascular;  Laterality: N/A;  . CATARACT EXTRACTION Right 2019   Dr. Patrici Ranks  . CORONARY ANGIOPLASTY    .  ELECTROPHYSIOLOGIC STUDY N/A 06/11/2016   Procedure: Atrial Fibrillation Ablation;  Surgeon: Will [name], MD;  Location: [place] [facility];  Service: Cardiovascular;  Laterality: N/A;  . TOTAL HIP ARTHROPLASTY Bilateral 2001  . UVULECTOMY      FHx:    Reviewed / unchanged  SHx:    Reviewed / unchanged   Systems Review:  Constitutional: Denies fever, chills, wt changes, headaches, insomnia, fatigue, night sweats, change in appetite. Eyes: Denies redness, blurred vision, diplopia, discharge, itchy, watery eyes.  ENT: Denies discharge, congestion, post nasal drip, epistaxis, sore throat, earache, hearing loss, dental pain, tinnitus, vertigo, sinus pain, snoring.  CV: Denies chest pain, palpitations, irregular heartbeat, syncope, dyspnea, diaphoresis, orthopnea, PND, claudication or edema. Respiratory: denies  cough, dyspnea, DOE, pleurisy, hoarseness, laryngitis, wheezing.  Gastrointestinal: Denies dysphagia, odynophagia, heartburn, reflux, water brash, abdominal pain or cramps, nausea, vomiting, bloating, diarrhea, constipation, hematemesis, melena, hematochezia  or hemorrhoids. Genitourinary: Denies dysuria, frequency, urgency, nocturia, hesitancy, discharge, hematuria or flank pain. Musculoskeletal: Denies arthralgias, myalgias, stiffness, jt. swelling, pain, limping or strain/sprain.  Skin: Denies pruritus, rash, hives, warts, acne, eczema or change in skin lesion(s). Neuro: No weakness, tremor, incoordination, spasms, paresthesia or pain. Psychiatric: Denies confusion, memory loss or sensory loss. Endo: Denies change in weight, skin or hair change.  Heme/Lymph: No excessive bleeding, bruising or enlarged lymph nodes.  Physical Exam  BP 140/82   Pulse 71   Temp 97.6 F (36.4 C)   Wt 257 lb (116.6 kg)   SpO2 96%   BMI 34.86 kg/m   Appears  well nourished, well groomed  and in no distress.  Eyes: PERRLA, EOMs, conjunctiva no swelling or erythema. Sinuses: No  frontal/maxillary tenderness ENT/Mouth: EAC's clear, TM's nl w/o erythema, bulging. Nares clear w/o erythema, swelling, exudates. Oropharynx clear without erythema or exudates. Oral hygiene is good. Tongue normal, non obstructing. Hearing intact.  Neck: Supple. Thyroid not palpable. Car 2+/2+ without bruits, nodes or JVD. Chest: Respirations nl with BS clear & equal w/o rales, rhonchi, wheezing or stridor.  Cor: Heart sounds normal w/ regular rate and rhythm without sig. murmurs, gallops, clicks or rubs. Peripheral pulses normal and equal  without edema.  Abdomen: Soft & bowel sounds normal. Non-tender w/o guarding, rebound, hernias, masses or organomegaly.  Lymphatics: Unremarkable.  Musculoskeletal: Full ROM all peripheral extremities, joint stability, 5/5 strength and normal gait.  Skin: Warm, dry without exposed rashes, lesions or ecchymosis apparent.  Neuro: Cranial nerves intact, reflexes equal bilaterally. Sensory-motor testing grossly intact. Tendon reflexes grossly intact.  Pysch: Alert & oriented x 3.  Insight and judgement nl & appropriate. No ideations.  Assessment and Plan:  1. Essential hypertension  - Continue medication, monitor blood pressure at home.  - Continue DASH diet.  Reminder to go to the ER if any CP,  SOB, nausea, dizziness, severe HA, changes vision/speech.  - CBC with Diff - COMPLETE METABOLIC PANEL WITH GFR - Magnesium - TSH  2. Hyperlipidemia associated with type 2 diabetes mellitus ([place])  - Continue diet/meds, exercise,& lifestyle modifications.  - Continue monitor periodic cholesterol/liver & renal functions   - Lipid Profile - TSH  3. Type 2 diabetes mellitus with stage 2 chronic kidney disease, without long-term current use of insulin (HCC)  - Continue diet, exercise  - Lifestyle modifications.  - Monitor appropriate labs.  - Hemoglobin A1c ([facility]) - Insulin, random  4. Vitamin D deficiency  - Continue supplementation.  - Vitamin D  (25 hydroxy)  5. Hypothyroidism  - TSH  6. Paroxysmal atrial fibrillation (H[facility])  - TSH  7. Coronary artery disease involving native coronary artery of native heart without angina pectoris  - Lipid Profile  8. Chronic gout  - Uric acid  9. Medication management  - CBC with Diff - COMPLETE METABOLIC PANEL WITH GFR - Magnesium - Lipid Profile - TSH - Hemoglobin A1c ([facility]) - Insulin, random - Vitamin D (25 hydroxy) - Uric acid        Discussed  regular exercise, BP monitoring, weight control to achieve/maintain BMI less than 25 and discussed med and SE's. Recommended labs to assess and monitor clinical status with further disposition pending results of labs.  I discussed the assessment and treatment plan with the patient. The patient was  provided an opportunity to ask questions and all were answered. The patient agreed with the plan and demonstrated an understanding of the instructions.  I provided over 30 minutes of exam, counseling, chart review and  complex critical decision making.  Kirtland Bouchard, MD

## 2019-04-24 NOTE — Patient Instructions (Signed)

## 2019-04-25 ENCOUNTER — Encounter: Payer: Self-pay | Admitting: Internal Medicine

## 2019-04-25 ENCOUNTER — Ambulatory Visit (INDEPENDENT_AMBULATORY_CARE_PROVIDER_SITE_OTHER): Payer: Medicare Other | Admitting: Internal Medicine

## 2019-04-25 ENCOUNTER — Other Ambulatory Visit: Payer: Self-pay

## 2019-04-25 VITALS — BP 140/82 | HR 71 | Temp 97.6°F | Resp 12 | Ht 70.5 in | Wt 257.0 lb

## 2019-04-25 DIAGNOSIS — I2510 Atherosclerotic heart disease of native coronary artery without angina pectoris: Secondary | ICD-10-CM | POA: Diagnosis not present

## 2019-04-25 DIAGNOSIS — E1122 Type 2 diabetes mellitus with diabetic chronic kidney disease: Secondary | ICD-10-CM | POA: Diagnosis not present

## 2019-04-25 DIAGNOSIS — M1A9XX0 Chronic gout, unspecified, without tophus (tophi): Secondary | ICD-10-CM

## 2019-04-25 DIAGNOSIS — E1169 Type 2 diabetes mellitus with other specified complication: Secondary | ICD-10-CM | POA: Diagnosis not present

## 2019-04-25 DIAGNOSIS — I10 Essential (primary) hypertension: Secondary | ICD-10-CM

## 2019-04-25 DIAGNOSIS — Z79899 Other long term (current) drug therapy: Secondary | ICD-10-CM

## 2019-04-25 DIAGNOSIS — E039 Hypothyroidism, unspecified: Secondary | ICD-10-CM

## 2019-04-25 DIAGNOSIS — E559 Vitamin D deficiency, unspecified: Secondary | ICD-10-CM | POA: Diagnosis not present

## 2019-04-25 DIAGNOSIS — N182 Chronic kidney disease, stage 2 (mild): Secondary | ICD-10-CM

## 2019-04-25 DIAGNOSIS — E785 Hyperlipidemia, unspecified: Secondary | ICD-10-CM | POA: Diagnosis not present

## 2019-04-25 DIAGNOSIS — I480 Paroxysmal atrial fibrillation: Secondary | ICD-10-CM | POA: Diagnosis not present

## 2019-04-26 LAB — COMPLETE METABOLIC PANEL WITH GFR
AG Ratio: 1.2 (calc) (ref 1.0–2.5)
ALT: 28 U/L (ref 9–46)
AST: 26 U/L (ref 10–35)
Albumin: 4.2 g/dL (ref 3.6–5.1)
Alkaline phosphatase (APISO): 124 U/L (ref 35–144)
BUN: 17 mg/dL (ref 7–25)
CO2: 25 mmol/L (ref 20–32)
Calcium: 9.2 mg/dL (ref 8.6–10.3)
Chloride: 101 mmol/L (ref 98–110)
Creat: 1.04 mg/dL (ref 0.70–1.25)
GFR, Est African American: 85 mL/min/{1.73_m2} (ref >=60)
GFR, Est Non African American: 73 mL/min/{1.73_m2} (ref >=60)
Globulin: 3.5 g/dL (calc) (ref 1.9–3.7)
Glucose, Bld: 158 mg/dL — ABNORMAL HIGH (ref 65–99)
Potassium: 3.9 mmol/L (ref 3.5–5.3)
Sodium: 139 mmol/L (ref 135–146)
Total Bilirubin: 0.8 mg/dL (ref 0.2–1.2)
Total Protein: 7.7 g/dL (ref 6.1–8.1)

## 2019-04-26 LAB — CBC WITH DIFFERENTIAL/PLATELET
Absolute Monocytes: 689 cells/uL (ref 200–950)
Basophils Absolute: 32 cells/uL (ref 0–200)
Basophils Relative: 0.4 %
Eosinophils Absolute: 113 cells/uL (ref 15–500)
Eosinophils Relative: 1.4 %
HCT: 44.7 % (ref 38.5–50.0)
Hemoglobin: 15.1 g/dL (ref 13.2–17.1)
Lymphs Abs: 1482 cells/uL (ref 850–3900)
MCH: 32.3 pg (ref 27.0–33.0)
MCHC: 33.8 g/dL (ref 32.0–36.0)
MCV: 95.5 fL (ref 80.0–100.0)
MPV: 10.4 fL (ref 7.5–12.5)
Monocytes Relative: 8.5 %
Neutro Abs: 5783 cells/uL (ref 1500–7800)
Neutrophils Relative %: 71.4 %
Platelets: 259 10*3/uL (ref 140–400)
RBC: 4.68 10*6/uL (ref 4.20–5.80)
RDW: 15.2 % — ABNORMAL HIGH (ref 11.0–15.0)
Total Lymphocyte: 18.3 %
WBC: 8.1 10*3/uL (ref 3.8–10.8)

## 2019-04-26 LAB — LIPID PANEL
Cholesterol: 129 mg/dL (ref <200)
HDL: 39 mg/dL — ABNORMAL LOW (ref >=40)
LDL Cholesterol (Calc): 62 mg/dL (calc)
Non-HDL Cholesterol (Calc): 90 mg/dL (calc) (ref <130)
Total CHOL/HDL Ratio: 3.3 (calc) (ref <5.0)
Triglycerides: 214 mg/dL — ABNORMAL HIGH (ref <150)

## 2019-04-26 LAB — URIC ACID: Uric Acid, Serum: 5.7 mg/dL (ref 4.0–8.0)

## 2019-04-26 LAB — HEMOGLOBIN A1C
Hgb A1c MFr Bld: 7.2 % of total Hgb — ABNORMAL HIGH (ref <5.7)
Mean Plasma Glucose: 160 (calc)
eAG (mmol/L): 8.9 (calc)

## 2019-04-26 LAB — TSH: TSH: 3.52 mIU/L (ref 0.40–4.50)

## 2019-04-26 LAB — VITAMIN D 25 HYDROXY (VIT D DEFICIENCY, FRACTURES): Vit D, 25-Hydroxy: 52 ng/mL (ref 30–100)

## 2019-04-26 LAB — INSULIN, RANDOM: Insulin: 36.3 u[IU]/mL — ABNORMAL HIGH

## 2019-04-26 LAB — MAGNESIUM: Magnesium: 1.8 mg/dL (ref 1.5–2.5)

## 2019-05-22 ENCOUNTER — Encounter (INDEPENDENT_AMBULATORY_CARE_PROVIDER_SITE_OTHER): Payer: Self-pay

## 2019-05-22 ENCOUNTER — Ambulatory Visit (INDEPENDENT_AMBULATORY_CARE_PROVIDER_SITE_OTHER): Payer: Medicare Other | Admitting: *Deleted

## 2019-05-22 ENCOUNTER — Other Ambulatory Visit: Payer: Self-pay

## 2019-05-22 DIAGNOSIS — I4819 Other persistent atrial fibrillation: Secondary | ICD-10-CM | POA: Diagnosis not present

## 2019-05-22 DIAGNOSIS — Z5181 Encounter for therapeutic drug level monitoring: Secondary | ICD-10-CM | POA: Diagnosis not present

## 2019-05-22 LAB — POCT INR: INR: 1.5 — AB (ref 2.0–3.0)

## 2019-05-22 NOTE — Patient Instructions (Signed)
Description    Take 2 tablets today and tomorrow. Then continue on same dosage 1.5 tablets daily except 1 tablet on Sundays and Thursdays. Recheck INR in 2 weeks. Coumadin Clinic: [phone] Main number: [phone]

## 2019-06-05 ENCOUNTER — Ambulatory Visit (INDEPENDENT_AMBULATORY_CARE_PROVIDER_SITE_OTHER): Payer: Medicare Other | Admitting: *Deleted

## 2019-06-05 ENCOUNTER — Other Ambulatory Visit: Payer: Self-pay

## 2019-06-05 DIAGNOSIS — Z5181 Encounter for therapeutic drug level monitoring: Secondary | ICD-10-CM

## 2019-06-05 DIAGNOSIS — I4819 Other persistent atrial fibrillation: Secondary | ICD-10-CM

## 2019-06-05 LAB — POCT INR: INR: 2.6 (ref 2.0–3.0)

## 2019-06-05 NOTE — Patient Instructions (Signed)
Description   Continue on same dosage 1.5 tablets daily except 1 tablet on Sundays and Thursdays. Recheck INR in 4 weeks. Coumadin Clinic: [phone] Main number: [phone]

## 2019-07-03 ENCOUNTER — Ambulatory Visit (INDEPENDENT_AMBULATORY_CARE_PROVIDER_SITE_OTHER): Payer: Medicare Other | Admitting: *Deleted

## 2019-07-03 ENCOUNTER — Ambulatory Visit: Payer: Medicare Other | Admitting: Cardiology

## 2019-07-03 ENCOUNTER — Other Ambulatory Visit: Payer: Self-pay

## 2019-07-03 DIAGNOSIS — I4819 Other persistent atrial fibrillation: Secondary | ICD-10-CM

## 2019-07-03 DIAGNOSIS — Z5181 Encounter for therapeutic drug level monitoring: Secondary | ICD-10-CM

## 2019-07-03 LAB — POCT INR: INR: 2.5 (ref 2.0–3.0)

## 2019-07-03 NOTE — Patient Instructions (Signed)
Description   Continue on same dosage 1.5 tablets daily except 1 tablet on Sundays and Thursdays. Recheck INR in 5 weeks. Coumadin Clinic: [phone] Main number: [phone]

## 2019-07-10 ENCOUNTER — Encounter: Payer: Self-pay | Admitting: Cardiology

## 2019-07-10 ENCOUNTER — Ambulatory Visit: Payer: Medicare Other | Admitting: Cardiology

## 2019-07-10 ENCOUNTER — Other Ambulatory Visit: Payer: Self-pay

## 2019-07-10 ENCOUNTER — Encounter (INDEPENDENT_AMBULATORY_CARE_PROVIDER_SITE_OTHER): Payer: Self-pay

## 2019-07-10 VITALS — BP 148/74 | HR 66 | Ht 70.5 in | Wt 259.0 lb

## 2019-07-10 DIAGNOSIS — I4819 Other persistent atrial fibrillation: Secondary | ICD-10-CM | POA: Diagnosis not present

## 2019-07-10 NOTE — Patient Instructions (Signed)
Medication Instructions:  Your physician recommends that you continue on your current medications as directed. Please refer to the Current Medication list given to you today.  *If you need a refill on your cardiac medications before your next appointment, please call your pharmacy*  Lab Work: None ordered If you have labs (blood work) drawn today and your tests are completely normal, you will receive your results only by: . MyChart Message (if you have MyChart) OR . A paper copy in the mail If you have any lab test that is abnormal or we need to change your treatment, we will call you to review the results.  Testing/Procedures: None ordered  Follow-Up: At [facility], you and your health needs are our priority.  As part of our continuing mission to provide you with exceptional heart care, we have created designated Provider Care Teams.  These Care Teams include your primary Cardiologist (physician) and Advanced Practice Providers (APPs -  Physician Assistants and Nurse Practitioners) who all work together to provide you with the care you need, when you need it.  Your next appointment:   1 year(s)  The format for your next appointment:   In Person  Provider:   Will Camnitz, MD   

## 2019-07-10 NOTE — Progress Notes (Signed)
Electrophysiology Office Note   Date:  07/10/2019   ID:  Christopher Wade, [place] [birth year], MRN [id number]  PCP:  [name], MD  Cardiologist:  Radford Pax Primary Electrophysiologist:  Dameian Crisman [name], MD    No chief complaint on file.    History of Present Illness: Christopher Wade is a 69 y.o. male who presents today for electrophysiology evaluation.   He has a history of atrial fibrillation s/p DCCV 03/2015 to NSR but before discharge he went back into afib. He also has a history of HTN, dyslipidemia, DM, ASCAD cath showing 50% LM, 40% distal LAD, 70% ost to prox LCs, 40% mid LAD and 95% mid to dist RCA s/p PCI with DES x 2 and stage PCI of the LCx/OM. At last OV he was loaded with Amio and then he was set up to have DCCV which he successfully had done in July 2017. Repeat cardioversion 03/16/16. AF ablation 06/11/16.  Today, denies symptoms of palpitations, chest pain, shortness of breath, orthopnea, PND, lower extremity edema, claudication, dizziness, presyncope, syncope, bleeding, or neurologic sequela. The patient is tolerating medications without difficulties.  He is overall felt well.  He is noted no further episodes of atrial fibrillation.  Past Medical History:  Diagnosis Date  . Arthritis    "joints get stiff" (06/11/2016)  . Bee sting allergy   . Bee sting allergy   . CAD (coronary artery disease), native coronary artery 02/12/2016   cath showing 50% LM, 40% distal LAD, 70% ost to prox LCs, 40% mid LAD and 95% mid to dist RCA s/p PCI with DES x 2 and stage PCI of the LCx/OM.  [name] Carotid artery stenosis    1-39% bilateral by dopplers 08/2017  . History of gout   . Hx of adenomatous colonic polyps 04/10/2008  . Hyperlipidemia   . Hypertension   . Hypothyroidism   . OSA (obstructive sleep apnea)    "tried CPAP; couldn't do it" (06/11/2016)  . Persistent atrial fibrillation ([place])    s/p DCCV 03/2015 with reversion back to atrial fibrillation, loaded with Amio and s/p  DCCV to NSR 11/2015.  He is now s/p afib albation.  He had amio lung toxicity and amio was stopped.  . Thyroid disease   . Type II diabetes mellitus ([place])   . Vitamin D deficiency    Past Surgical History:  Procedure Laterality Date  . CARDIAC CATHETERIZATION N/A 10/11/2015   Procedure: Left Heart Cath and Coronary Angiography;  Surgeon: [name], MD;  Location: [facility];  Service: Cardiovascular;  Laterality: N/A;  . CARDIAC CATHETERIZATION N/A 10/11/2015   Procedure: Coronary Stent Intervention;  Surgeon: [name], MD;  Location: [facility];  Service: Cardiovascular;  Laterality: N/A;  . CARDIAC CATHETERIZATION N/A 10/11/2015   Procedure: Intravascular Pressure Wire/FFR Study;  Surgeon: [name], MD;  Location: [facility];  Service: Cardiovascular;  Laterality: N/A;  . CARDIAC CATHETERIZATION N/A 10/14/2015   Procedure: Coronary Stent Intervention;  Surgeon: [name], MD;  Location: [facility];  Service: Cardiovascular;  Laterality: N/A;  . CARDIOVERSION N/A 04/05/2015   Procedure: CARDIOVERSION;  Surgeon: [name], MD;  Location: [facility];  Service: Cardiovascular;  Laterality: N/A;  . CARDIOVERSION N/A 12/09/2015   Procedure: CARDIOVERSION;  Surgeon: [name], MD;  Location: [facility];  Service: Cardiovascular;  Laterality: N/A;  . CARDIOVERSION N/A 03/16/2016   Procedure: CARDIOVERSION;  Surgeon: [name], MD;  Location:  [facility];  Service: Cardiovascular;  Laterality: N/A;  . CATARACT EXTRACTION Right 2019   Dr. Patrici Ranks  . CORONARY ANGIOPLASTY    . ELECTROPHYSIOLOGIC STUDY N/A 06/11/2016   Procedure: Atrial Fibrillation Ablation;  Surgeon: Shanon Seawright [name], MD;  Location: [facility];  Service: Cardiovascular;  Laterality: N/A;  . TOTAL HIP ARTHROPLASTY Bilateral 2001  . UVULECTOMY       Current Outpatient Medications  Medication Sig Dispense Refill  . allopurinol (ZYLOPRIM) 300 MG  tablet Take 1 tablet Daily to Prevent Gout 90 tablet 3  . amLODipine (NORVASC) 5 MG tablet Take 1 & 1/2 Daily for BP 135 tablet 3  . atorvastatin (LIPITOR) 80 MG tablet Take 1 tablet (80 mg total) by mouth daily at 6 PM. 90 tablet 3  . B Complex Vitamins (B COMPLEX PO) Take 1 tablet by mouth daily.    . carvedilol (COREG) 12.5 MG tablet Take 1 tablet (12.5 mg total) by mouth 2 (two) times daily. 180 tablet 3  . cholecalciferol (VITAMIN D) 1000 UNITS tablet Take 2,000 Units by mouth daily.    . colchicine 0.6 MG tablet Take 1 tablet daily to prevent Gout Attacks 90 tablet 3  . levothyroxine (SYNTHROID, LEVOTHROID) 100 MCG tablet Take 1 tablet daily on an empty stomach with only water for 30 minutes & no Antacid meds, Calcium or Magnesium for 4 hours & avoid Biotin 90 tablet 3  . loratadine (CLARITIN) 10 MG tablet Take 10 mg by mouth daily.    . magnesium gluconate (MAGONATE) 500 MG tablet Take 1,000 mg by mouth daily.     . metFORMIN (GLUCOPHAGE-XR) 500 MG 24 hr tablet TAKE 2 TABLETS BY MOUTH TWO TIMES DAILY FOR DIABETES 360 tablet 1  . olmesartan (BENICAR) 40 MG tablet Take 1 tablet Daily for BP 90 tablet 3  . warfarin (COUMADIN) 5 MG tablet TAKE 1 OR 1 AND 1/2 TABLET BY MOUTH EVERY DAY OR AS DIRECTED BY COUMADIN CLINIC 45 tablet 2   No current facility-administered medications for this visit.    Allergies:   Amiodarone and Bee venom   Social History:  The patient  reports that he has never smoked. He has never used smokeless tobacco. He reports that he does not drink alcohol or use drugs.   Family History:  The patient's family history includes Colon cancer in his sister; Lung cancer in his father and mother.   ROS:  Please see the history of present illness.   Otherwise, review of systems is positive for none.   All other systems are reviewed and negative.   PHYSICAL EXAM: VS:  BP (!) 148/74   Pulse 66   Ht 5' 10.5" (1.791 m)   Wt 259 lb (117.5 kg)   BMI 36.64 kg/m  , BMI Body mass  index is 36.64 kg/m. GEN: Well nourished, well developed, in no acute distress  HEENT: normal  Neck: no JVD, carotid bruits, or masses Cardiac: RRR; no murmurs, rubs, or gallops,no edema  Respiratory:  clear to auscultation bilaterally, normal work of breathing GI: soft, nontender, nondistended, + BS MS: no deformity or atrophy  Skin: warm and dry Neuro:  Strength and sensation are intact Psych: euthymic mood, full affect  EKG:  EKG is ordered today. Personal review of the ekg ordered shows sinus rhythm, rate 66  Recent Labs: 04/25/2019: ALT 28; BUN 17; Creat 1.04; Hemoglobin 15.1; Magnesium 1.8; Platelets 259; Potassium 3.9; Sodium 139; TSH 3.52    Lipid Panel  Component Value Date/Time   CHOL 129 04/25/2019 1414   TRIG 214 (H) 04/25/2019 1414   HDL 39 (L) 04/25/2019 1414   CHOLHDL 3.3 04/25/2019 1414   VLDL 19 11/30/2016 1609   LDLCALC 62 04/25/2019 1414     Wt Readings from Last 3 Encounters:  07/10/19 259 lb (117.5 kg)  04/25/19 257 lb (116.6 kg)  02/01/19 251 lb (113.9 kg)      Other studies Reviewed: Additional studies/ records that were reviewed today include: TTE 03/2015, Cath 10/14/15  Review of the above records today demonstrates:  - Left ventricle: The cavity size was normal. Wall thickness was   increased in a pattern of mild LVH. Systolic function was normal.   The estimated ejection fraction was in the range of 60% to 65%.   Indeterminant diastolic function (atrial fibrillation). Wall   motion was normal; there were no regional wall motion   abnormalities. - Aortic valve: There was no stenosis. - Aorta: Borderline dilated aortic root. - Mitral valve: There was trivial regurgitation. - Left atrium: The atrium was mildly dilated. - Right ventricle: The cavity size was moderately dilated. Systolic   function was normal. - Right atrium: The atrium was mildly dilated. - Tricuspid valve: Peak RV-RA gradient (S): 32 mm Hg. - Pulmonary arteries: PA peak  pressure: 35 mm Hg (S). - Inferior vena cava: The vessel was normal in size. The   respirophasic diameter changes were in the normal range (>= 50%),   consistent with normal central venous pressure.   Ost 1st Mrg to 1st Mrg lesion to mid circumflex, 95% stenosed. Post intervention with a 2.75 x 32 Synergy, postdilated to 3.5 mm, there is a 0% residual stenosis.  The Synergy stent extends from the OM back into the native circumflex, jailing the mid circumflex. Postprocedure, there is TIMI-3 flow in the circumflex with only a moderate stenosis at the jailed portion.   Cath 10/11/15  LM lesion, 50% stenosed.  Dist LAD lesion, 40% stenosed.  Ost Cx to Prox Cx lesion, 70% stenosed.  1st Mrg lesion, 70% stenosed.  Mid LAD lesion, 40% stenosed.  Mid RCA to Dist RCA lesion, 95% stenosed. Post intervention, there is a 0% residual stenosis. The lesion was not previously treated.   ASSESSMENT AND PLAN:  1.  Persistent atrial fibrillation/flutter: Currently on warfarin.  Status post ablation 06/11/2016.  CHA2DS2-VASc of 4.  He has fortunately remained in sinus rhythm.  No changes.   2. Hypertension: Has whitecoat hypertension.  Is elevated today but is normal at home.  3. Hyperlipidemia: Continue statin  4. CAD: Status post circumflex and RCA PCI.  No current chest pain.  5. OSA: CPAP compliance encouraged  Current medicines are reviewed at length with the patient today.   The patient does not have concerns regarding his medicines.  The following changes were made today: None  Labs/ tests ordered today include:  Orders Placed This Encounter  Procedures  . EKG 12-Lead     Disposition:   FU with Glendoris Nodarse 12 months  Signed, Deneane Stifter [name], MD  07/10/2019 2:25 PM     [place] [address] [phone] (office) [phone] (fax)

## 2019-07-11 ENCOUNTER — Other Ambulatory Visit: Payer: Self-pay | Admitting: Cardiology

## 2019-07-11 ENCOUNTER — Other Ambulatory Visit: Payer: Self-pay | Admitting: Adult Health

## 2019-07-18 ENCOUNTER — Ambulatory Visit: Payer: Self-pay | Admitting: Adult Health Nurse Practitioner

## 2019-07-31 NOTE — Progress Notes (Signed)
MEDICARE ANNUAL WELLNESS VISIT AND 3 MONTH FOLLOW UP Assessment:   Brysan was seen today for medicare wellness.  Diagnoses and all orders for this visit:  Encounter for Medicare annual wellness exam Yearly  Essential hypertension -Continue medications: Norvasc 5mg , Carvedilol 1.5 tablets BID, Benicar 40mg  DASH diet, exercise and monitor at home. Call if greater than 130/80.  -     CBC with Differential/Platelet -     COMPLETE METABOLIC PANEL WITH GFR -     Magnesium 1.5mg  Daily   Hyperlipidemia associated with type 2 diabetes mellitus (HCC) Continue medications: Atorvastatin 80mg  Discussed dietary and exercise modifications Low fat diet -     Lipid panel  Persistent atrial fibrillation No concerns with excessive bleeding  No falls Continue medication: Coumadin current dose 1.5 tablets (7.5mg ) two days a week and 1 tablet (5mg ) five days a week. Continue same dose until lab results Discussed hospital precautions with patient Will continue to monitor   CKD stage 2 due to type 2 diabetes mellitus (HCC) Increase fluids  Avoid NSAIDS Blood pressure control Monitor sugars  Will continue to monitor   Type 2 diabetes mellitus with stage 2 chronic kidney disease, without long-term current use of insulin (HCC) Continue medications: Metformin 500mg  XL two tablets twice a day with meals. Continue diet and exercise.  Perform daily foot/skin check, notify office of any concerning changes.  -     Hemoglobin A1c -     Insulin, random  Chronic gout without tophus, unspecified cause, unspecified site Continue Allopurinol 300mg  daily Gout- recheck Uric acid today Diet discussed Continue medications. -     Uric acid  OSA (obstructive sleep apnea) Following with neurology for this Having issues with mask not fitting properly Causing xeropthalmia Due to follow up for this  Coronary artery disease involving native coronary artery of native heart without angina pectoris Discussed  dietary and exercise modifications Blood pressure and cholesterol control   Obesity (BMI 30.0-34.9) Discussed dietary and exercise modifications Encouraged healthy behaviors walking 3-4 times a week.  Discussed increasing time or distance of walks.  Hypothyroidism, Taking levothyroxine 145mcg tablet daily Continue medications the same pending lab results Reminded to take on an empty stomach 30-33mins before food.  -     TSH  Vitamin D deficiency Continue supplementation Taking Vitamin D 2,000 IU daily  Medication management - Continued   Discussed hospital precautions with patient and agrees with plan of care.  Call or return with new or worsening symptoms as discussed in appointment.  May contact office via phone [phone] or [place].  Follow up in 3 month(s).  Over 40 minutes of face to face interview, exam, counseling, chart review, and critical decision making was performed  Future Appointments  Date Time Provider [place]  09/12/2019  2:00 PM [facility]  11/02/2019  3:00 PM [name], MD [facility] None     Plan:   During the course of the visit the patient was educated and counseled about appropriate screening and preventive services including:    Pneumococcal vaccine   Influenza vaccine  Prevnar 13  Td vaccine  Screening electrocardiogram  Colorectal cancer screening  Diabetes screening  Glaucoma screening  Nutrition counseling    Subjective:  Christopher Wade is a 69 y.o. male who presents for Medicare Annual Wellness Visit and 3 month follow up for HTN, HLD, DMII, GERD, BPH, and vitamin D Def.   His blood pressure has been controlled at home, today their BP is BP:  132/72 He does workout by walking 2-3 days a week. He denies chest pain, shortness of breath, dizziness.  He is on cholesterol medication and denies myalgias. His cholesterol is at goal. The cholesterol last visit was:   Lab  Results  Component Value Date   CHOL 129 04/25/2019   HDL 39 (L) 04/25/2019   LDLCALC 62 04/25/2019   TRIG 214 (H) 04/25/2019   CHOLHDL 3.3 04/25/2019   He has been working on diet and exercise for diabetes, and denies hyperglycemia, hypoglycemia , increased appetite, nausea, polydipsia, polyuria, visual disturbances and vomiting. Last A1C in the office was:  Lab Results  Component Value Date   HGBA1C 7.2 (H) 04/25/2019   Last GFR Lab Results  Component Value Date   GFRNONAA 73 04/25/2019     Lab Results  Component Value Date   GFRAA 85 04/25/2019   Patient is on Vitamin D supplement.   Lab Results  Component Value Date   VD25OH 52 04/25/2019      Medication Review:  Current Outpatient Medications (Endocrine & Metabolic):  .  levothyroxine (SYNTHROID, LEVOTHROID) 100 MCG tablet, Take 1 tablet daily on an empty stomach with only water for 30 minutes & no Antacid meds, Calcium or Magnesium for 4 hours & avoid Biotin .  metFORMIN (GLUCOPHAGE-XR) 500 MG 24 hr tablet, TAKE 2 TABLETS BY MOUTH TWO TIMES DAILY FOR DIABETES  Current Outpatient Medications (Cardiovascular):  .  amLODipine (NORVASC) 5 MG tablet, Take 1 & 1/2 Daily for BP .  atorvastatin (LIPITOR) 80 MG tablet, Take 1 tablet (80 mg total) by mouth daily at 6 PM. .  carvedilol (COREG) 12.5 MG tablet, Take 2 tablet 2 x  /day for BP & Heart .  olmesartan (BENICAR) 40 MG tablet, Take 1 tablet Daily for BP  Current Outpatient Medications (Respiratory):  .  loratadine (CLARITIN) 10 MG tablet, Take 10 mg by mouth daily.  Current Outpatient Medications (Analgesics):  .  allopurinol (ZYLOPRIM) 300 MG tablet, Take 1 tablet Daily to Prevent Gout .  colchicine 0.6 MG tablet, Take 1 tablet daily to prevent Gout Attacks (Patient not taking: Reported on 08/01/2019)  Current Outpatient Medications (Hematological):  .  warfarin (COUMADIN) 5 MG tablet, TAKE 1 OR 1 AND 1/2 TABLET BY MOUTH EVERY DAY OR AS DIRECTED BY COUMADIN  CLINIC  Current Outpatient Medications (Other):  [name]  B Complex Vitamins (B COMPLEX PO), Take 1 tablet by mouth daily. .  cholecalciferol (VITAMIN D) 1000 UNITS tablet, Take 2,000 Units by mouth daily. .  magnesium gluconate (MAGONATE) 500 MG tablet, Take 1,000 mg by mouth daily.   Allergies: Allergies  Allergen Reactions  . Amiodarone Other (See Comments)    Lung toxicity  . 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  Screening Tests Immunization History  Administered  Date(s) Administered  . Influenza Split 04/05/2014  . Influenza, High Dose Seasonal PF 03/31/2018, 03/20/2019  . Influenza-Unspecified 02/02/2013, 05/21/2016  . Pneumococcal Conjugate-13 05/01/2014  . Pneumococcal Polysaccharide-23 05/16/2015  . Td 04/25/2010  . Varicella Zoster Immune Globulin 02/22/2014    Preventative care: Last colonoscopy: 2019  Prior vaccinations: TD or Tdap: 2011, Due 04/2020 discussed with patient Influenza: 2019 Pneumococcal: 2016 Prevnar13: 2015 Shingles/Zostavax: 2015   Names of Other Physician/Practitioners you currently use: 1. [facility]  here for primary care 2. Eye exam 2019 3.Dentist, Due  Patient Care Team: [name], MD as PCP - General (Internal Medicine) Constance Haw, MD as PCP - Electrophysiology (Cardiology) [name], MD as PCP - Cardiology (Cardiology) Gatha Mayer, MD as Consulting Physician (Gastroenterology) [name], MD as Consulting Physician (Cardiology)  Surgical: He  has a past surgical history that includes Cardioversion (N/A, 04/05/2015); Cardioversion (N/A, 12/09/2015); Cardioversion (N/A, 03/16/2016); Cardiac catheterization (N/A, 06/11/2016); Total hip arthroplasty (Bilateral, 2001); Uvulectomy; Cardiac catheterization (N/A, 10/11/2015); Cardiac catheterization (N/A, 10/11/2015); Cardiac catheterization (N/A, 10/11/2015); Cardiac catheterization (N/A, 10/14/2015); Coronary angioplasty; and Cataract extraction (Right, 2019). Family His family history includes Colon cancer in his sister; Lung cancer in his father and mother. Social history  He reports that he has never smoked. He has never used smokeless tobacco. He reports that he does not drink alcohol or use drugs.  MEDICARE WELLNESS OBJECTIVES: Physical activity: Current Exercise Habits: The patient does not participate in regular exercise at present, Exercise limited by: None identified Cardiac risk factors: Cardiac Risk Factors include: advanced age (>6men, >54 women);diabetes mellitus;dyslipidemia;hypertension;male gender;sedentary lifestyle;obesity (BMI >30kg/m2) Depression/mood screen:   Depression screen [name] 2/9 08/01/2019  Decreased Interest 0  Down, Depressed, Hopeless 0  PHQ - 2 Score 0    ADLs:  In your present state of health, do you have any difficulty performing the following activities: 08/01/2019 04/24/2019  Hearing? N N  Vision? N N  Difficulty concentrating or making decisions? N N  Walking or climbing stairs? N N  Dressing or bathing? N N  Doing errands, shopping? N N  Preparing Food and eating ? N -   Using the Toilet? N -  In the past six months, have you accidently leaked urine? N -  Do you have problems with loss of bowel control? N -  Managing your Medications? N -  Managing your Finances? N -  Housekeeping or managing your Housekeeping? N -  Some recent data might be hidden     Cognitive Testing  Alert? Yes  Normal Appearance?Yes  Oriented to person? Yes  Place? Yes   Time? Yes  Recall of three objects?  Yes  Can perform simple calculations? Yes  Displays appropriate judgment?Yes  Can read the correct time from a watch face?Yes  EOL planning: Does Patient Have a Medical Advance Directive?: No Would patient like information on creating a medical advance directive?: No - Patient declined   Objective:   Today's Vitals   08/01/19 1505  BP: 132/72  Pulse: 63  Temp: 97.9 F (36.6 C)  SpO2: 94%  Weight: 257 lb (116.6 kg)   Body mass index is 36.35 kg/m.  General appearance: alert, no distress, WD/WN, male HEENT: normocephalic, sclerae anicteric, TMs pearly, nares patent, no discharge or erythema, pharynx normal Oral cavity: MMM, no lesions Neck: supple, no lymphadenopathy, no thyromegaly, no masses Heart: RRR, normal S1, S2, no murmurs Lungs: CTA bilaterally, no wheezes, rhonchi, or rales Abdomen: +bs, soft, non  tender, non distended, no masses, no hepatomegaly, no splenomegaly Musculoskeletal: nontender, no swelling, no obvious deformity Extremities: no edema, no cyanosis, no clubbing Pulses: 2+ symmetric, upper and lower extremities, normal cap refill Neurological: alert, oriented x 3, CN2-12 intact, strength normal upper extremities and lower extremities, sensation normal throughout, DTRs 2+ throughout, no cerebellar signs, gait normal Psychiatric: normal affect, behavior normal, pleasant   Medicare Attestation I have personally reviewed: The patient's medical and social history Their use of alcohol, tobacco or illicit drugs Their current medications and  supplements The patient's functional ability including ADLs,fall risks, home safety risks, cognitive, and hearing and visual impairment Diet and physical activities Evidence for depression or mood disorders  The patient's weight, height, BMI, and visual acuity have been recorded in the chart.  I have made referrals, counseling, and provided education to the patient based on review of the above and I have provided the patient with a written personalized care plan for preventive services.     Garnet Sierras, NP   08/01/2019

## 2019-08-01 ENCOUNTER — Encounter: Payer: Self-pay | Admitting: Adult Health Nurse Practitioner

## 2019-08-01 ENCOUNTER — Other Ambulatory Visit: Payer: Self-pay

## 2019-08-01 ENCOUNTER — Ambulatory Visit: Payer: Medicare Other | Admitting: Adult Health Nurse Practitioner

## 2019-08-01 ENCOUNTER — Ambulatory Visit: Payer: Medicare Other | Admitting: *Deleted

## 2019-08-01 VITALS — BP 132/72 | HR 63 | Temp 97.9°F | Wt 257.0 lb

## 2019-08-01 DIAGNOSIS — E1169 Type 2 diabetes mellitus with other specified complication: Secondary | ICD-10-CM

## 2019-08-01 DIAGNOSIS — I10 Essential (primary) hypertension: Secondary | ICD-10-CM | POA: Diagnosis not present

## 2019-08-01 DIAGNOSIS — Z5181 Encounter for therapeutic drug level monitoring: Secondary | ICD-10-CM | POA: Diagnosis not present

## 2019-08-01 DIAGNOSIS — E559 Vitamin D deficiency, unspecified: Secondary | ICD-10-CM | POA: Diagnosis not present

## 2019-08-01 DIAGNOSIS — R6889 Other general symptoms and signs: Secondary | ICD-10-CM

## 2019-08-01 DIAGNOSIS — E785 Hyperlipidemia, unspecified: Secondary | ICD-10-CM | POA: Diagnosis not present

## 2019-08-01 DIAGNOSIS — N138 Other obstructive and reflux uropathy: Secondary | ICD-10-CM

## 2019-08-01 DIAGNOSIS — E039 Hypothyroidism, unspecified: Secondary | ICD-10-CM

## 2019-08-01 DIAGNOSIS — N182 Chronic kidney disease, stage 2 (mild): Secondary | ICD-10-CM

## 2019-08-01 DIAGNOSIS — I4819 Other persistent atrial fibrillation: Secondary | ICD-10-CM

## 2019-08-01 DIAGNOSIS — E66811 Obesity, class 1: Secondary | ICD-10-CM

## 2019-08-01 DIAGNOSIS — N401 Enlarged prostate with lower urinary tract symptoms: Secondary | ICD-10-CM

## 2019-08-01 DIAGNOSIS — E1122 Type 2 diabetes mellitus with diabetic chronic kidney disease: Secondary | ICD-10-CM | POA: Diagnosis not present

## 2019-08-01 DIAGNOSIS — Z0000 Encounter for general adult medical examination without abnormal findings: Secondary | ICD-10-CM

## 2019-08-01 DIAGNOSIS — E669 Obesity, unspecified: Secondary | ICD-10-CM

## 2019-08-01 DIAGNOSIS — I2510 Atherosclerotic heart disease of native coronary artery without angina pectoris: Secondary | ICD-10-CM

## 2019-08-01 DIAGNOSIS — Z79899 Other long term (current) drug therapy: Secondary | ICD-10-CM

## 2019-08-01 DIAGNOSIS — G4733 Obstructive sleep apnea (adult) (pediatric): Secondary | ICD-10-CM

## 2019-08-01 DIAGNOSIS — M1A9XX0 Chronic gout, unspecified, without tophus (tophi): Secondary | ICD-10-CM

## 2019-08-01 DIAGNOSIS — Z0001 Encounter for general adult medical examination with abnormal findings: Secondary | ICD-10-CM

## 2019-08-01 LAB — POCT INR: INR: 2.1 (ref 2.0–3.0)

## 2019-08-01 NOTE — Patient Instructions (Addendum)
The Coreg 12.5mg  tablet, take one and half tablet twice a day.   Continue to monitor your blood pressure   If readings are 110/60 or less contact our office.   We will call you in 1-3 days with your lab results.    Vit D  & Vit C 1,000 mg   are recommended to help protect  against the Covid-19 and other Corona viruses.    Also it's recommended  to take  Zinc 50 mg  to help  protect against the Covid-19   and best place to get  is also on [email or web address]  and don't pay more than 6-8 cents /pill !  ================================ Coronavirus (COVID-19) Are you at risk?  Are you at risk for the Coronavirus (COVID-19)?  To be considered HIGH RISK for Coronavirus (COVID-19), you have to meet the following criteria:  . Traveled to [place], [place], [place], [place] or [place]; or in the [place] to [place], [place], [place]  . or [place]; and have fever, cough, and shortness of breath within the last 2 weeks of travel OR . Been in close contact with a person diagnosed with COVID-19 within the last 2 weeks and have  . fever, cough,and shortness of breath .  . IF YOU DO NOT MEET THESE CRITERIA, YOU ARE CONSIDERED LOW RISK FOR COVID-19.  What to do if you are HIGH RISK for COVID-19?  [name] If you are having a medical emergency, call 911. . Seek medical care right away. Before you go to a doctor's office, urgent care or emergency department, .  call ahead and tell them about your recent travel, contact with someone diagnosed with COVID-19  .  and your symptoms.  . You should receive instructions from your physician's office regarding next steps of care.  . When you arrive at healthcare provider, tell the healthcare staff immediately you have returned from  . visiting [place], [place], [place], [place] or [place]; or traveled in the [place] to [place], [place],  . [place] or [place] in the last two weeks or you have been in close contact with a person diagnosed  with  . COVID-19 in the last 2 weeks.   . Tell the health care staff about your symptoms: fever, cough and shortness of breath. . After you have been seen by a medical provider, you will be either: o Tested for (COVID-19) and discharged home on quarantine except to seek medical care if  o symptoms worsen, and asked to  - Stay home and avoid contact with others until you get your results (4-5 days)  - Avoid travel on public transportation if possible (such as bus, train, or airplane) or o Sent to the Emergency Department by EMS for evaluation, COVID-19 testing  and  o possible admission depending on your condition and test results.  What to do if you are LOW RISK for COVID-19?  Reduce your risk of any infection by using the same precautions used for avoiding the common cold or flu:  [name] Wash your hands often with soap and warm water for at least 20 seconds.  If soap and water are not readily available,  . use an alcohol-based hand sanitizer with at least 60% alcohol.  . If coughing or sneezing, cover your mouth and nose by coughing or sneezing into the elbow areas of your shirt or coat, .  into a tissue or into your sleeve (not your hands). . Avoid shaking hands with others  and consider head nods or verbal greetings only. . Avoid touching your eyes, nose, or mouth with unwashed hands.  . Avoid close contact with people who are sick. . Avoid places or events with large numbers of people in one location, like concerts or sporting events. . Carefully consider travel plans you have or are making. . If you are planning any travel outside or inside the [place], visit the CDC's Travelers' Health webpage for the latest health notices. . If you have some symptoms but not all symptoms, continue to monitor at home and seek medical attention  . if your symptoms worsen. . If you are having a medical emergency, call 911. >>>>>>>>>>>>>>>>>>>>>>>>>>>>>>>>>>>>>>>>>>>>>>>>>>>>>>> We Do NOT Approve of  [facility] [place] Soliciting Our Patients  To Do Home Visits  & We Do NOT Approve of [employer] > > > > > > > > > > > > > > > > > > > > > > > > > > > > > > > > > > >  > > > >   Preventive Care for Adults  A healthy lifestyle and preventive care can promote health and wellness. Preventive health guidelines for men include the following key practices:  A routine yearly physical is a good way to check with your health care provider about your health and preventative screening. It is a chance to share any concerns and updates on your health and to receive a thorough exam.  Visit your dentist for a routine exam and preventative care every 6 months. Brush your teeth twice a day and floss once a day. Good oral hygiene prevents tooth decay and gum disease.  The frequency of eye exams is based on your age, health, family medical history, use of contact lenses, and other factors. Follow your health care provider's recommendations for frequency of eye exams.  Eat a healthy diet. Foods such as vegetables, fruits, whole grains, low-fat dairy products, and lean protein foods contain the nutrients you need without too many calories. Decrease your intake of foods high in solid fats, added sugars, and salt. Eat the right amount of calories for you. Get information about a proper diet from your health care provider, if necessary.  Regular physical exercise is one of the most important things you can do for your health. Most adults should get at least 150 minutes of moderate-intensity exercise (any activity that increases your heart rate and causes you to sweat) each week. In addition, most adults need muscle-strengthening exercises on 2 or more days a week.  Maintain a healthy weight. The body mass index (BMI) is a screening tool to identify possible weight problems. It provides an estimate of body fat based on height and weight. Your health care provider can find your BMI and can help you achieve  or maintain a healthy weight. For adults 20 years and older:  A BMI below 18.5 is considered underweight.  A BMI of 18.5 to 24.9 is normal.  A BMI of 25 to 29.9 is considered overweight.  A BMI of 30 and above is considered obese.  Maintain normal blood lipids and cholesterol levels by exercising and minimizing your intake of saturated fat. Eat a balanced diet with plenty of fruit and vegetables. Blood tests for lipids and cholesterol should begin at age 44 and be repeated every 5 years. If your lipid or cholesterol levels are high, you are over 50, or you are at high risk for heart disease, you may need your cholesterol  levels checked more frequently. Ongoing high lipid and cholesterol levels should be treated with medicines if diet and exercise are not working.  If you smoke, find out from your health care provider how to quit. If you do not use tobacco, do not start.  Lung cancer screening is recommended for adults aged 54-80 years who are at high risk for developing lung cancer because of a history of smoking. A yearly low-dose CT scan of the lungs is recommended for people who have at least a 30-pack-year history of smoking and are a current smoker or have quit within the past 15 years. A pack year of smoking is smoking an average of 1 pack of cigarettes a day for 1 year (for example: 1 pack a day for 30 years or 2 packs a day for 15 years). Yearly screening should continue until the smoker has stopped smoking for at least 15 years. Yearly screening should be stopped for people who develop a health problem that would prevent them from having lung cancer treatment.  If you choose to drink alcohol, do not have more than 2 drinks per day. One drink is considered to be 12 ounces (355 mL) of beer, 5 ounces (148 mL) of wine, or 1.5 ounces (44 mL) of liquor.  Avoid use of street drugs. Do not share needles with anyone. Ask for help if you need support or instructions about stopping the use of  drugs.  High blood pressure causes heart disease and increases the risk of stroke. Your blood pressure should be checked at least every 1-2 years. Ongoing high blood pressure should be treated with medicines, if weight loss and exercise are not effective.  If you are 2-56 years old, ask your health care provider if you should take aspirin to prevent heart disease.  Diabetes screening involves taking a blood sample to check your fasting blood sugar level. Testing should be considered at a younger age or be carried out more frequently if you are overweight and have at least 1 risk factor for diabetes.  Colorectal cancer can be detected and often prevented. Most routine colorectal cancer screening begins at the age of 48 and continues through age 50. However, your health care provider may recommend screening at an earlier age if you have risk factors for colon cancer. On a yearly basis, your health care provider may provide home test kits to check for hidden blood in the stool. Use of a small camera at the end of a tube to directly examine the colon (sigmoidoscopy or colonoscopy) can detect the earliest forms of colorectal cancer. Talk to your health care provider about this at age 36, when routine screening begins. Direct exam of the colon should be repeated every 5-10 years through age 65, unless early forms of precancerous polyps or small growths are found.  Hepatitis C blood testing is recommended for all people born from [birth year] through 1965 and any individual with known risks for hepatitis C.  Screening for abdominal aortic aneurysm (AAA)  by ultrasound is recommended for people who have history of high blood pressure or who are current or former smokers.  Healthy men should  receive prostate-specific antigen (PSA) blood tests as part of routine cancer screening. Talk with your health care provider about prostate cancer screening.  Testicular cancer screening is  recommended for adult males.  Screening includes self-exam, a health care provider exam, and other screening tests. Consult with your health care provider about any symptoms you have or any concerns you  have about testicular cancer.  Use sunscreen. Apply sunscreen liberally and repeatedly throughout the day. You should seek shade when your shadow is shorter than you. Protect yourself by wearing long sleeves, pants, a wide-brimmed hat, and sunglasses year round, whenever you are outdoors.  Once a month, do a whole-body skin exam, using a mirror to look at the skin on your back. Tell your health care provider about new moles, moles that have irregular borders, moles that are larger than a pencil eraser, or moles that have changed in shape or color.  Stay current with required vaccines (immunizations).  Influenza vaccine. All adults should be immunized every year.  Tetanus, diphtheria, and acellular pertussis (Td, Tdap) vaccine. An adult who has not previously received Tdap or who does not know his vaccine status should receive 1 dose of Tdap. This initial dose should be followed by tetanus and diphtheria toxoids (Td) booster doses every 10 years. Adults with an unknown or incomplete history of completing a 3-dose immunization series with Td-containing vaccines should begin or complete a primary immunization series including a Tdap dose. Adults should receive a Td booster every 10 years.  Zoster vaccine. One dose is recommended for adults aged 68 years or older unless certain conditions are present.    PREVNAR - Pneumococcal 13-valent conjugate (PCV13) vaccine. When indicated, a person who is uncertain of his immunization history and has no record of immunization should receive the PCV13 vaccine. An adult aged 24 years or older who has certain medical conditions and has not been previously immunized should receive 1 dose of PCV13 vaccine. This PCV13 should be followed with a dose of pneumococcal polysaccharide (PPSV23) vaccine. The  PPSV23 vaccine dose should be obtained 1 or more year(s)after the dose of PCV13 vaccine. An adult aged 23 years or older who has certain medical conditions and previously received 1 or more doses of PPSV23 vaccine should receive 1 dose of PCV13. The PCV13 vaccine dose should be obtained 1 or more years after the last PPSV23 vaccine dose.    PNEUMOVAX - Pneumococcal polysaccharide (PPSV23) vaccine. When PCV13 is also indicated, PCV13 should be obtained first. All adults aged 76 years and older should be immunized. An adult younger than age 44 years who has certain medical conditions should be immunized. Any person who resides in a nursing home or long-term care facility should be immunized. An adult smoker should be immunized. People with an immunocompromised condition and certain other conditions should receive both PCV13 and PPSV23 vaccines. People with human immunodeficiency virus (HIV) infection should be immunized as soon as possible after diagnosis. Immunization during chemotherapy or radiation therapy should be avoided. Routine use of PPSV23 vaccine is not recommended for American Indians, [place] Natives, or people younger than 65 years unless there are medical conditions that require PPSV23 vaccine. When indicated, people who have unknown immunization and have no record of immunization should receive PPSV23 vaccine. One-time revaccination 5 years after the first dose of PPSV23 is recommended for people aged 19-64 years who have chronic kidney failure, nephrotic syndrome, asplenia, or immunocompromised conditions. People who received 1-2 doses of PPSV23 before age 96 years should receive another dose of PPSV23 vaccine at age 90 years or later if at least 5 years have passed since the previous dose. Doses of PPSV23 are not needed for people immunized with PPSV23 at or after age 12 years.    Hepatitis A vaccine. Adults who wish to be protected from this disease, have certain high-risk conditions, work  with  hepatitis A-infected animals, work in hepatitis A research labs, or travel to or work in countries with a high rate of hepatitis A should be immunized. Adults who were previously unvaccinated and who anticipate close contact with an international adoptee during the first 60 days after arrival in the [place] from a country with a high rate of hepatitis A should be immunized.    Hepatitis B vaccine. Adults should be immunized if they wish to be protected from this disease, have certain high-risk conditions, may be exposed to blood or other infectious body fluids, are household contacts or sex partners of hepatitis B positive people, are clients or workers in certain care facilities, or travel to or work in countries with a high rate of hepatitis B.   Preventive Service / Frequency   Ages 90 and over  Blood pressure check.  Lipid and cholesterol check.  Lung cancer screening. / Every year if you are aged 79-80 years and have a 30-pack-year history of smoking and currently smoke or have quit within the past 15 years. Yearly screening is stopped once you have quit smoking for at least 15 years or develop a health problem that would prevent you from having lung cancer treatment.  Fecal occult blood test (FOBT) of stool. You may not have to do this test if you get a colonoscopy every 10 years.  Flexible sigmoidoscopy** or colonoscopy.** / Every 5 years for a flexible sigmoidoscopy or every 10 years for a colonoscopy beginning at age 5 and continuing until age 63.  Hepatitis C blood test.** / For all people born from [birth year] through 1966 and any individual with known risks for hepatitis C.  Abdominal aortic aneurysm (AAA) screening./ Screening current or former smokers or have Hypertension.  Skin self-exam. / Monthly.  Influenza vaccine. / Every year.  Tetanus, diphtheria, and acellular pertussis (Tdap/Td) vaccine.** / 1 dose of Td every 10 years.   Zoster vaccine.** / 1 dose for  adults aged 36 years or older.         Pneumococcal 13-valent conjugate (PCV13) vaccine.    Pneumococcal polysaccharide (PPSV23) vaccine.     Hepatitis A vaccine.** / Consult your health care provider.  Hepatitis B vaccine.** / Consult your health care provider. Screening for abdominal aortic aneurysm (AAA)  by ultrasound is recommended for people who have history of high blood pressure or who are current or former smokers. ++++++++++ Recommend Adult Low Dose Aspirin or  coated  Aspirin 81 mg daily  To reduce risk of Colon Cancer 40 %,  Skin Cancer 26 % ,  Malignant Melanoma 46%  and  Pancreatic cancer 60% ++++++++++++++++++++++ Vitamin D goal  is between 70-100.  Please make sure that you are taking your Vitamin D as directed.  It is very important as a natural anti-inflammatory  helping hair, skin, and nails, as well as reducing stroke and heart attack risk.  It helps your bones and helps with mood. It also decreases numerous cancer risks so please take it as directed.  Low Vit D is associated with a 200-300% higher risk for CANCER  and 200-300% higher risk for HEART   ATTACK  &  STROKE.   .....................................[name] It is also associated with higher death rate at younger ages,  autoimmune diseases like Rheumatoid arthritis, Lupus, Multiple Sclerosis.    Also many other serious conditions, like depression, Alzheimer's Dementia, infertility, muscle aches, fatigue, fibromyalgia - just to name a few. ++++++++++++++++++++++ Recommend the book "The END of DIETING" by Dr  Excell Seltzer  & the book "The END of DIABETES " by Dr Excell Seltzer At [email or web address] - get book & Audio CD's    Being diabetic has a  300% increased risk for heart attack, stroke, cancer, and alzheimer- type vascular dementia. It is very important that you work harder with diet by avoiding all foods that are white. Avoid white rice (brown & wild rice is OK), white potatoes (sweetpotatoes in moderation is  OK), White bread or wheat bread or anything made out of white flour like bagels, donuts, rolls, buns, biscuits, cakes, pastries, cookies, pizza crust, and pasta (made from white flour & egg whites) - vegetarian pasta or spinach or wheat pasta is OK. Multigrain breads like Arnold's or [employer], or multigrain sandwich thins or flatbreads.  Diet, exercise and weight loss can reverse and cure diabetes in the early stages.  Diet, exercise and weight loss is very important in the control and prevention of complications of diabetes which affects every system in your body, ie. Brain - dementia/stroke, eyes - glaucoma/blindness, heart - heart attack/heart failure, kidneys - dialysis, stomach - gastric paralysis, intestines - malabsorption, nerves - severe painful neuritis, circulation - gangrene & loss of a leg(s), and finally cancer and Alzheimers.    I recommend avoid fried & greasy foods,  sweets/candy, white rice (brown or wild rice or Quinoa is OK), white potatoes (sweet potatoes are OK) - anything made from white flour - bagels, doughnuts, rolls, buns, biscuits,white and wheat breads, pizza crust and traditional pasta made of white flour & egg white(vegetarian pasta or spinach or wheat pasta is OK).  Multi-grain bread is OK - like multi-grain flat bread or sandwich thins. Avoid alcohol in excess. Exercise is also important.    Eat all the vegetables you want - avoid meat, especially red meat and dairy - especially cheese.  Cheese is the most concentrated form of trans-fats which is the worst thing to clog up our arteries. Veggie cheese is OK which can be found in the fresh produce section at [employer] or [employer] or [employer]  ++++++++++++++++++++++ DASH Eating Plan  DASH stands for "Dietary Approaches to Stop Hypertension."   The DASH eating plan is a healthy eating plan that has been shown to reduce high blood pressure (hypertension). Additional health benefits may include reducing the risk  of type 2 diabetes mellitus, heart disease, and stroke. The DASH eating plan may also help with weight loss. WHAT DO I NEED TO KNOW ABOUT THE DASH EATING PLAN? For the DASH eating plan, you will follow these general guidelines:  Choose foods with a percent daily value for sodium of less than 5% (as listed on the food label).  Use salt-free seasonings or herbs instead of table salt or sea salt.  Check with your health care provider or pharmacist before using salt substitutes.  Eat lower-sodium products, often labeled as "lower sodium" or "no salt added."  Eat fresh foods.  Eat more vegetables, fruits, and low-fat dairy products.  Choose whole grains. Look for the word "whole" as the first word in the ingredient list.  Choose fish   Limit sweets, desserts, sugars, and sugary drinks.  Choose heart-healthy fats.  Eat veggie cheese   Eat more home-cooked food and less restaurant, buffet, and fast food.  Limit fried foods.  Cook foods using methods other than frying.  Limit canned vegetables. If you do use them, rinse them well to decrease the sodium.  When eating at a restaurant, ask that your  food be prepared with less salt, or no salt if possible.                      WHAT FOODS CAN I EAT? Read Dr Fara Olden Fuhrman's books on The End of Dieting & The End of Diabetes  Grains Whole grain or whole wheat bread. Brown rice. Whole grain or whole wheat pasta. Quinoa, bulgur, and whole grain cereals. Low-sodium cereals. Corn or whole wheat flour tortillas. Whole grain cornbread. Whole grain crackers. Low-sodium crackers.  Vegetables Fresh or frozen vegetables (raw, steamed, roasted, or grilled). Low-sodium or reduced-sodium tomato and vegetable juices. Low-sodium or reduced-sodium tomato sauce and paste. Low-sodium or reduced-sodium canned vegetables.   Fruits All fresh, canned (in natural juice), or frozen fruits.  Protein Products  All fish and seafood.  Dried beans, peas, or  lentils. Unsalted nuts and seeds. Unsalted canned beans.  Dairy Low-fat dairy products, such as skim or 1% milk, 2% or reduced-fat cheeses, low-fat ricotta or cottage cheese, or plain low-fat yogurt. Low-sodium or reduced-sodium cheeses.  Fats and Oils Tub margarines without trans fats. Light or reduced-fat mayonnaise and salad dressings (reduced sodium). Avocado. Safflower, olive, or canola oils. Natural peanut or almond butter.  Other Unsalted popcorn and pretzels. The items listed above may not be a complete list of recommended foods or beverages. Contact your dietitian for more options.  ++++++++++++++++++++  WHAT FOODS ARE NOT RECOMMENDED? Grains/ White flour or wheat flour White bread. White pasta. White rice. Refined cornbread. Bagels and croissants. Crackers that contain trans fat.  Vegetables  Creamed or fried vegetables. Vegetables in a . Regular canned vegetables. Regular canned tomato sauce and paste. Regular tomato and vegetable juices.  Fruits Dried fruits. Canned fruit in light or heavy syrup. Fruit juice.  Meat and Other Protein Products Meat in general - RED meat & White meat.  Fatty cuts of meat. Ribs, chicken wings, all processed meats as bacon, sausage, bologna, salami, fatback, hot dogs, bratwurst and packaged luncheon meats.  Dairy Whole or 2% milk, cream, half-and-half, and cream cheese. Whole-fat or sweetened yogurt. Full-fat cheeses or blue cheese. Non-dairy creamers and whipped toppings. Processed cheese, cheese spreads, or cheese curds.  Condiments Onion and garlic salt, seasoned salt, table salt, and sea salt. Canned and packaged gravies. Worcestershire sauce. Tartar sauce. Barbecue sauce. Teriyaki sauce. Soy sauce, including reduced sodium. Steak sauce. Fish sauce. Oyster sauce. Cocktail sauce. Horseradish. Ketchup and mustard. Meat flavorings and tenderizers. Bouillon cubes. Hot sauce. Tabasco sauce. Marinades. Taco seasonings. Relishes.  Fats and  Oils Butter, stick margarine, lard, shortening and bacon fat. Coconut, palm kernel, or palm oils. Regular salad dressings.  Pickles and olives. Salted popcorn and pretzels.  The items listed above may not be a complete list of foods and beverages to avoid.

## 2019-08-01 NOTE — Patient Instructions (Signed)
Description   Continue on same dosage 1.5 tablets daily except 1 tablet on Sundays and Thursdays. Recheck INR in 6 weeks. [facility]: [phone] Main number: [phone]

## 2019-08-02 LAB — CBC WITH DIFFERENTIAL/PLATELET
Absolute Monocytes: 809 cells/uL (ref 200–950)
Basophils Absolute: 26 cells/uL (ref 0–200)
Basophils Relative: 0.3 %
Eosinophils Absolute: 122 cells/uL (ref 15–500)
Eosinophils Relative: 1.4 %
HCT: 45.1 % (ref 38.5–50.0)
Hemoglobin: 15 g/dL (ref 13.2–17.1)
Lymphs Abs: 1523 cells/uL (ref 850–3900)
MCH: 32.3 pg (ref 27.0–33.0)
MCHC: 33.3 g/dL (ref 32.0–36.0)
MCV: 97 fL (ref 80.0–100.0)
MPV: 10.7 fL (ref 7.5–12.5)
Monocytes Relative: 9.3 %
Neutro Abs: 6221 cells/uL (ref 1500–7800)
Neutrophils Relative %: 71.5 %
Platelets: 251 10*3/uL (ref 140–400)
RBC: 4.65 10*6/uL (ref 4.20–5.80)
RDW: 14.4 % (ref 11.0–15.0)
Total Lymphocyte: 17.5 %
WBC: 8.7 10*3/uL (ref 3.8–10.8)

## 2019-08-02 LAB — LIPID PANEL
Cholesterol: 115 mg/dL (ref <200)
HDL: 38 mg/dL — ABNORMAL LOW (ref >=40)
LDL Cholesterol (Calc): 55 mg/dL (calc)
Non-HDL Cholesterol (Calc): 77 mg/dL (calc) (ref <130)
Total CHOL/HDL Ratio: 3 (calc) (ref <5.0)
Triglycerides: 134 mg/dL (ref <150)

## 2019-08-02 LAB — COMPLETE METABOLIC PANEL WITH GFR
AG Ratio: 1.3 (calc) (ref 1.0–2.5)
ALT: 29 U/L (ref 9–46)
AST: 24 U/L (ref 10–35)
Albumin: 4.4 g/dL (ref 3.6–5.1)
Alkaline phosphatase (APISO): 119 U/L (ref 35–144)
BUN: 18 mg/dL (ref 7–25)
CO2: 29 mmol/L (ref 20–32)
Calcium: 9.2 mg/dL (ref 8.6–10.3)
Chloride: 103 mmol/L (ref 98–110)
Creat: 1.11 mg/dL (ref 0.70–1.25)
GFR, Est African American: 78 mL/min/{1.73_m2} (ref >=60)
GFR, Est Non African American: 67 mL/min/{1.73_m2} (ref >=60)
Globulin: 3.4 g/dL (calc) (ref 1.9–3.7)
Glucose, Bld: 125 mg/dL — ABNORMAL HIGH (ref 65–99)
Potassium: 4.3 mmol/L (ref 3.5–5.3)
Sodium: 142 mmol/L (ref 135–146)
Total Bilirubin: 1 mg/dL (ref 0.2–1.2)
Total Protein: 7.8 g/dL (ref 6.1–8.1)

## 2019-08-02 LAB — HEMOGLOBIN A1C
Hgb A1c MFr Bld: 7.3 % of total Hgb — ABNORMAL HIGH (ref <5.7)
Mean Plasma Glucose: 163 (calc)
eAG (mmol/L): 9 (calc)

## 2019-08-02 LAB — URIC ACID: Uric Acid, Serum: 5.4 mg/dL (ref 4.0–8.0)

## 2019-08-02 LAB — TSH: TSH: 3.94 mIU/L (ref 0.40–4.50)

## 2019-08-02 NOTE — Progress Notes (Signed)
Please contact patient with lab results:   -Blood count, electrolytes, kidney and liver function are in normal range.  -Your cholesterol looks good but HDL, good cholesterol is low.  HDL, good cholesterol is low.  Increase this by fish, [place] nuts, almonds, pistachios, walnuts, peanuts, avocados, beans.   -Your TSH, thyroid, is in normal range, continue same dose.  -A1c, looking at glucose in your blood over the past three months 7.3.  Continue to decrease carbohydrates, bread rice, pasta, potatoes, tortillas and sugars.  -Uric acid: looks good at 5.4.  Continue allopurinol 1 tablet daily.  Let us know if you have any questions or concerns.   Sincerely,        Garnet Sierras, NP

## 2019-08-16 ENCOUNTER — Other Ambulatory Visit: Payer: Self-pay | Admitting: Internal Medicine

## 2019-08-16 ENCOUNTER — Other Ambulatory Visit: Payer: Self-pay | Admitting: Adult Health Nurse Practitioner

## 2019-08-16 DIAGNOSIS — N182 Chronic kidney disease, stage 2 (mild): Secondary | ICD-10-CM

## 2019-08-16 DIAGNOSIS — E039 Hypothyroidism, unspecified: Secondary | ICD-10-CM

## 2019-08-16 DIAGNOSIS — E1122 Type 2 diabetes mellitus with diabetic chronic kidney disease: Secondary | ICD-10-CM

## 2019-08-16 DIAGNOSIS — I10 Essential (primary) hypertension: Secondary | ICD-10-CM

## 2019-08-16 MED ORDER — LEVOTHYROXINE SODIUM 100 MCG PO TABS: ORAL_TABLET | ORAL | 0 refills | Status: DC

## 2019-08-16 MED ORDER — AMLODIPINE BESYLATE 5 MG PO TABS: ORAL_TABLET | ORAL | 0 refills | Status: DC

## 2019-09-12 ENCOUNTER — Ambulatory Visit: Payer: Medicare Other | Admitting: *Deleted

## 2019-09-12 ENCOUNTER — Other Ambulatory Visit: Payer: Self-pay

## 2019-09-12 DIAGNOSIS — Z5181 Encounter for therapeutic drug level monitoring: Secondary | ICD-10-CM

## 2019-09-12 DIAGNOSIS — I4819 Other persistent atrial fibrillation: Secondary | ICD-10-CM | POA: Diagnosis not present

## 2019-09-12 LAB — POCT INR: INR: 2.1 (ref 2.0–3.0)

## 2019-09-12 NOTE — Patient Instructions (Signed)
Description   Continue on same dosage 1.5 tablets daily except 1 tablet on Sundays and Thursdays. Recheck INR in 8 weeks. Coumadin Clinic: [phone] Main number: [phone]    

## 2019-10-15 ENCOUNTER — Other Ambulatory Visit: Payer: Self-pay | Admitting: Internal Medicine

## 2019-10-15 MED ORDER — CARVEDILOL 12.5 MG PO TABS: ORAL_TABLET | ORAL | 0 refills | Status: DC

## 2019-10-18 ENCOUNTER — Other Ambulatory Visit: Payer: Self-pay | Admitting: Cardiology

## 2019-10-26 ENCOUNTER — Other Ambulatory Visit: Payer: Self-pay | Admitting: *Deleted

## 2019-10-26 MED ORDER — CARVEDILOL 12.5 MG PO TABS: ORAL_TABLET | ORAL | 0 refills | Status: DC

## 2019-11-01 ENCOUNTER — Encounter: Payer: Self-pay | Admitting: Internal Medicine

## 2019-11-01 NOTE — Patient Instructions (Signed)

## 2019-11-01 NOTE — Progress Notes (Signed)
Annual  Screening/Preventative Visit  & Comprehensive Evaluation & Examination     This very nice 69 y.o. single WM  presents for a Screening /Preventative Visit & comprehensive evaluation and management of multiple medical co-morbidities.  Patient has been followed for HTN, ASHD / pAfib,  HLD, T2_NIDDM  and Vitamin D Deficiency. Patient has hx/ OSA, but also      HTN predates since 2006. Patient's BP has been controlled at home.  Today's BP is at goal - 132/76. Patient was dx'd with pAfib in 2016 and failed CV in 2016 & 2017.   In Jan 2018, he had an ablation.  Patient was treated with Amiodarone and developed associated Pulmonary toxicity.  Patient remains on Coumadin  monitored at the [facility].  Patient denies any cardiac symptoms as chest pain, palpitations, shortness of breath, dizziness or ankle swelling.     Patient's hyperlipidemia is controlled with diet and medications. Patient denies myalgias or other medication SE's. Last lipids were at goal:  Lab Results  Component Value Date   CHOL 115 08/01/2019   HDL 38 (L) 08/01/2019   LDLCALC 55 08/01/2019   TRIG 134 08/01/2019   CHOLHDL 3.0 08/01/2019       Patient has Morbid Obesity (BMI 35+) and consequent  T2_NIDDM since 2012  and patient denies reactive hypoglycemic symptoms, visual blurring, diabetic polys or paresthesias. Last A1c was not at goal: Lab Results  Component Value Date   HGBA1C 7.3 (H) 08/01/2019       Patient has been on  Thyroid Replacement since 2011.  Finally, patient has history of Vitamin D Deficiency  ("23" / 2009) and last vitamin D was sl low (goal 60-100):  Lab Results  Component Value Date   VD25OH 52 04/25/2019    Current Outpatient Medications on File Prior to Visit  Medication Sig  . allopurinol (ZYLOPRIM) 300 MG tablet Take 1 tablet Daily to Prevent Gout  . amLODipine (NORVASC) 5 MG tablet Take 1 & 1/2 tablets   Daily for BP  . atorvastatin (LIPITOR) 80 MG tablet Take 1  tablet (80 mg total) by mouth daily at 6 PM.  . B Complex Vitamins (B COMPLEX PO) Take 1 tablet by mouth daily.  . carvedilol (COREG) 12.5 MG tablet Take 2 tablet 2 x  /day for BP & Heart  . cholecalciferol (VITAMIN D) 1000 UNITS tablet Take 2,000 Units by mouth daily.  . colchicine 0.6 MG tablet Take 1 tablet daily to prevent Gout Attacks  . levothyroxine (SYNTHROID) 100 MCG tablet Take 1 tablet daily on an empty stomach with only water for 30 minutes & no Antacid meds, Calcium or Magnesium for 4 hours & avoid Biotin  . loratadine (CLARITIN) 10 MG tablet Take 10 mg by mouth daily.  . magnesium gluconate (MAGONATE) 500 MG tablet Take 1,000 mg by mouth daily.   . metFORMIN (GLUCOPHAGE-XR) 500 MG 24 hr tablet Take 2 tablets 2 x /day with Meals for Diabetes  . olmesartan (BENICAR) 40 MG tablet Take 1 tablet Daily for BP  . warfarin (COUMADIN) 5 MG tablet TAKE 1 OR 1 AND 1/2 TABLET BY MOUTH EVERY DAY OR AS DIRECTED BY COUMADIN CLINIC   No current facility-administered medications on file prior to visit.   Allergies  Allergen Reactions  . Amiodarone Other (See Comments)    Lung toxicity  . Bee Venom Other (See Comments)    Leg went numb   Past Medical History:  Diagnosis Date  . Arthritis    "  joints get stiff" (06/11/2016)  . Bee sting allergy   . Bee sting allergy   . CAD (coronary artery disease), native coronary artery 02/12/2016   cath showing 50% LM, 40% distal LAD, 70% ost to prox LCs, 40% mid LAD and 95% mid to dist RCA s/p PCI with DES x 2 and stage PCI of the LCx/OM.  [name] Carotid artery stenosis    1-39% bilateral by dopplers 08/2017  . History of gout   . Hx of adenomatous colonic polyps 04/10/2008  . Hyperlipidemia   . Hypertension   . Hypothyroidism   . OSA (obstructive sleep apnea)    "tried CPAP; couldn't do it" (06/11/2016)  . Persistent atrial fibrillation ([place])    s/p DCCV 03/2015 with reversion back to atrial fibrillation, loaded with Amio and s/p DCCV to NSR 11/2015.   He is now s/p afib albation.  He had amio lung toxicity and amio was stopped.  . Thyroid disease   . Type II diabetes mellitus ([place])   . Vitamin D deficiency    Health Maintenance  Topic Date Due  . COVID-19 Vaccine (1) Never done  . OPHTHALMOLOGY EXAM  10/01/2018  . INFLUENZA VACCINE  12/24/2019  . HEMOGLOBIN A1C  02/01/2020  . TETANUS/TDAP  04/25/2020  . FOOT EXAM  10/31/2020  . COLONOSCOPY  10/06/2022  . Hepatitis C Screening  Completed  . PNA vac Low Risk Adult  Completed   Immunization History  Administered Date(s) Administered  . Influenza Split 04/05/2014  . Influenza, High Dose Seasonal PF 03/31/2018, 03/20/2019  . Influenza-Unspecified 02/02/2013, 05/21/2016  . Pneumococcal Conjugate-13 05/01/2014  . Pneumococcal Polysaccharide-23 05/16/2015  . Td 04/25/2010  . Varicella Zoster Immune Globulin 02/22/2014   Last Colon -  10/05/2017 - Dr Carlean Purl recc 5 yr f/u due May 2024.   Past Surgical History:  Procedure Laterality Date  . CARDIAC CATHETERIZATION N/A 10/11/2015   Procedure: Left Heart Cath and Coronary Angiography;  Surgeon: [name], MD;  Location: [place] [facility];  Service: Cardiovascular;  Laterality: N/A;  . CARDIAC CATHETERIZATION N/A 10/11/2015   Procedure: Coronary Stent Intervention;  Surgeon: [name], MD;  Location: [place] [facility];  Service: Cardiovascular;  Laterality: N/A;  . CARDIAC CATHETERIZATION N/A 10/11/2015   Procedure: Intravascular Pressure Wire/FFR Study;  Surgeon: [name], MD;  Location: [place] [facility];  Service: Cardiovascular;  Laterality: N/A;  . CARDIAC CATHETERIZATION N/A 10/14/2015   Procedure: Coronary Stent Intervention;  Surgeon: [name], MD;  Location: [place] [facility];  Service: Cardiovascular;  Laterality: N/A;  . CARDIOVERSION N/A 04/05/2015   Procedure: CARDIOVERSION;  Surgeon: [name], MD;  Location: [place] [facility];  Service: Cardiovascular;  Laterality: N/A;  . CARDIOVERSION N/A  12/09/2015   Procedure: CARDIOVERSION;  Surgeon: [name], MD;  Location: [place] [facility];  Service: Cardiovascular;  Laterality: N/A;  . CARDIOVERSION N/A 03/16/2016   Procedure: CARDIOVERSION;  Surgeon: [name], MD;  Location: [place];  Service: Cardiovascular;  Laterality: N/A;  . CATARACT EXTRACTION Right 2019   Dr. Patrici Ranks  . CORONARY ANGIOPLASTY    . ELECTROPHYSIOLOGIC STUDY N/A 06/11/2016   Procedure: Atrial Fibrillation Ablation;  Surgeon: Will [name], MD;  Location: [place] [facility];  Service: Cardiovascular;  Laterality: N/A;  . TOTAL HIP ARTHROPLASTY Bilateral 2001  . UVULECTOMY     Family History  Problem Relation Age of Onset  . Lung cancer Mother   . Lung cancer Father   . Colon  cancer Sister    Social History   Socioeconomic History  . Marital status: Single    Spouse name: Not on file  . Number of children: Not on file  . Years of education: Not on file  . Highest education level: Not on file  Occupational History  . Not on file  Tobacco Use  . Smoking status: Never Smoker  . Smokeless tobacco: Never Used  Substance and Sexual Activity  . Alcohol use: No  . Drug use: No  . 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  Currently struggling to find much work.09/28/2017   Denies alcohol tobacco or drug use    ROS Constitutional: Denies fever, chills, weight loss/gain, headaches, insomnia,  night sweats or change in appetite. Does c/o fatigue. Eyes: Denies redness, blurred vision, diplopia, discharge, itchy or watery eyes.  ENT: Denies discharge, congestion, post nasal drip, epistaxis, sore throat, earache, hearing loss, dental pain, Tinnitus, Vertigo, Sinus pain or snoring.  Cardio: Denies chest pain, palpitations, irregular heartbeat, syncope, dyspnea, diaphoresis, orthopnea, PND, claudication or edema Respiratory: denies cough, dyspnea, DOE, pleurisy,  hoarseness, laryngitis or wheezing.  Gastrointestinal: Denies dysphagia, heartburn, reflux, water brash, pain, cramps, nausea, vomiting, bloating, diarrhea, constipation, hematemesis, melena, hematochezia, jaundice or hemorrhoids Genitourinary: Denies dysuria, frequency, urgency, nocturia, hesitancy, discharge, hematuria or flank pain Musculoskeletal: Denies arthralgia, myalgia, stiffness, Jt. Swelling, pain, limp or strain/sprain. Denies Falls. Skin: Denies puritis, rash, hives, warts, acne, eczema or change in skin lesion Neuro: No weakness, tremor, incoordination, spasms, paresthesia or pain Psychiatric: Denies confusion, memory loss or sensory loss. Denies Depression. Endocrine: Denies change in weight, skin, hair change, nocturia, and paresthesia, diabetic polys, visual blurring or hyper / hypo glycemic episodes.  Heme/Lymph: No excessive bleeding, bruising or enlarged lymph nodes.  Physical Exam  BP 132/76   Pulse 72   Temp (!) 97.2 F (36.2 C)   Resp 18   Ht 5' 10.5" (1.791 m)   Wt 258 lb (117 kg)   BMI 36.50 kg/m   General Appearance: Over nourished and  and in no apparent distress.  Eyes: PERRLA, EOMs, conjunctiva no swelling or erythema, normal fundi and vessels. Sinuses: No frontal/maxillary tenderness ENT/Mouth: EACs patent / TMs  nl. Nares clear without erythema, swelling, mucoid exudates. Oral hygiene is good. No erythema, swelling, or exudate. Tongue normal, non-obstructing. Tonsils not swollen or erythematous. Hearing normal.  Neck: Supple, thyroid not palpable. No bruits, nodes or JVD. Respiratory: Respiratory effort normal.  BS equal and clear bilateral without rales, rhonci, wheezing or stridor. Cardio: Heart sounds are normal with regular rate and rhythm and no murmurs, rubs or gallops. Peripheral pulses are normal and equal bilaterally without edema. No aortic or femoral bruits. Chest: symmetric with normal excursions and percussion.  Abdomen: Soft, with Nl bowel  sounds. Nontender, no guarding, rebound, hernias, masses, or organomegaly.  Lymphatics: Non tender without lymphadenopathy.  Musculoskeletal: Full ROM all peripheral extremities, joint stability, 5/5 strength, and normal gait. Skin: Warm and dry without rashes, lesions, cyanosis, clubbing or  ecchymosis.  Neuro: Cranial nerves intact, reflexes equal bilaterally. Normal muscle tone, no cerebellar symptoms. Sensation intact.  Pysch: Alert and oriented X 3 with normal affect, insight and judgment appropriate.   Assessment and Plan  1. Essential hypertension  - [place], RETROPERITNL ABD,  LTD - EKG 12-Lead - Urinalysis, Routine w reflex microscopic - Microalbumin / creatinine urine ratio - CBC with Differential/Platelet - COMPLETE METABOLIC  PANEL WITH GFR - Magnesium - TSH  2. Hyperlipidemia associated with type 2 diabetes mellitus (HCC)  - [place], RETROPERITNL ABD,  LTD - EKG 12-Lead - Lipid panel - TSH  3. Type 2 diabetes mellitus with stage 2 chronic kidney disease,  without long-term current use of insulin (HCC)  - [place], RETROPERITNL ABD,  LTD - EKG 12-Lead - Urinalysis, Routine w reflex microscopic - Microalbumin / creatinine urine ratio - HM DIABETES FOOT EXAM - LOW EXTREMITY NEUR EXAM DOCUM - Lipid panel  4. Vitamin D deficiency  - VITAMIN D 25 Hydroxy (Vit-D Deficiency, Fractures)  5. Paroxysmal atrial fibrillation (HCC)  - EKG 12-Lead - TSH  6. Coronary artery disease involving native coronary  artery of native heart without angina pectoris  - EKG 12-Lead - Lipid panel  7. Hypothyroidism  - TSH  8. Chronic gout   - Uric acid  9. OSA (obstructive sleep apnea)   10. BPH with obstruction/lower urinary tract symptoms  - PSA  11. Prostate cancer screening  - PSA  12. Screening for colorectal cancer  - POC Hemoccult Bld/Stl   13. Screening for ischemic heart disease  - EKG 12-Lead  14. Screening for AAA (aortic abdominal aneurysm)  - [place],  RETROPERITNL ABD,  LTD  15. Medication management  - Urinalysis, Routine w reflex microscopic - Microalbumin / creatinine urine ratio - Uric acid - CBC with Differential/Platelet - COMPLETE METABOLIC PANEL WITH GFR - Magnesium - Lipid panel - TSH - Hemoglobin A1c - Insulin, random - VITAMIN D 25 Hydroxy        Patient was counseled in prudent diet, weight control to achieve/maintain BMI less than 25, BP monitoring, regular exercise and medications as discussed.  Discussed med effects and SE's. Routine screening labs and tests as requested with regular follow-up as recommended. Over 40 minutes of exam, counseling, chart review and high complex critical decision making was performed   Kirtland Bouchard, MD

## 2019-11-02 ENCOUNTER — Other Ambulatory Visit: Payer: Self-pay

## 2019-11-02 ENCOUNTER — Ambulatory Visit (INDEPENDENT_AMBULATORY_CARE_PROVIDER_SITE_OTHER): Payer: Medicare Other | Admitting: Internal Medicine

## 2019-11-02 VITALS — BP 132/76 | HR 72 | Temp 97.2°F | Resp 18 | Ht 70.5 in | Wt 258.0 lb

## 2019-11-02 DIAGNOSIS — I10 Essential (primary) hypertension: Secondary | ICD-10-CM

## 2019-11-02 DIAGNOSIS — Z125 Encounter for screening for malignant neoplasm of prostate: Secondary | ICD-10-CM

## 2019-11-02 DIAGNOSIS — I480 Paroxysmal atrial fibrillation: Secondary | ICD-10-CM

## 2019-11-02 DIAGNOSIS — N138 Other obstructive and reflux uropathy: Secondary | ICD-10-CM

## 2019-11-02 DIAGNOSIS — Z136 Encounter for screening for cardiovascular disorders: Secondary | ICD-10-CM | POA: Diagnosis not present

## 2019-11-02 DIAGNOSIS — Z1211 Encounter for screening for malignant neoplasm of colon: Secondary | ICD-10-CM

## 2019-11-02 DIAGNOSIS — G4733 Obstructive sleep apnea (adult) (pediatric): Secondary | ICD-10-CM

## 2019-11-02 DIAGNOSIS — N401 Enlarged prostate with lower urinary tract symptoms: Secondary | ICD-10-CM

## 2019-11-02 DIAGNOSIS — J849 Interstitial pulmonary disease, unspecified: Secondary | ICD-10-CM

## 2019-11-02 DIAGNOSIS — E785 Hyperlipidemia, unspecified: Secondary | ICD-10-CM | POA: Diagnosis not present

## 2019-11-02 DIAGNOSIS — Z1212 Encounter for screening for malignant neoplasm of rectum: Secondary | ICD-10-CM

## 2019-11-02 DIAGNOSIS — E559 Vitamin D deficiency, unspecified: Secondary | ICD-10-CM | POA: Diagnosis not present

## 2019-11-02 DIAGNOSIS — E1169 Type 2 diabetes mellitus with other specified complication: Secondary | ICD-10-CM | POA: Diagnosis not present

## 2019-11-02 DIAGNOSIS — I2510 Atherosclerotic heart disease of native coronary artery without angina pectoris: Secondary | ICD-10-CM | POA: Diagnosis not present

## 2019-11-02 DIAGNOSIS — E1122 Type 2 diabetes mellitus with diabetic chronic kidney disease: Secondary | ICD-10-CM

## 2019-11-02 DIAGNOSIS — N182 Chronic kidney disease, stage 2 (mild): Secondary | ICD-10-CM

## 2019-11-02 DIAGNOSIS — Z79899 Other long term (current) drug therapy: Secondary | ICD-10-CM

## 2019-11-02 DIAGNOSIS — M1A9XX0 Chronic gout, unspecified, without tophus (tophi): Secondary | ICD-10-CM

## 2019-11-02 DIAGNOSIS — E039 Hypothyroidism, unspecified: Secondary | ICD-10-CM

## 2019-11-03 LAB — CBC WITH DIFFERENTIAL/PLATELET
Absolute Monocytes: 735 cells/uL (ref 200–950)
Basophils Absolute: 32 cells/uL (ref 0–200)
Basophils Relative: 0.4 %
Eosinophils Absolute: 119 cells/uL (ref 15–500)
Eosinophils Relative: 1.5 %
HCT: 44.7 % (ref 38.5–50.0)
Hemoglobin: 15.4 g/dL (ref 13.2–17.1)
Lymphs Abs: 1288 cells/uL (ref 850–3900)
MCH: 33.3 pg — ABNORMAL HIGH (ref 27.0–33.0)
MCHC: 34.5 g/dL (ref 32.0–36.0)
MCV: 96.8 fL (ref 80.0–100.0)
MPV: 10.2 fL (ref 7.5–12.5)
Monocytes Relative: 9.3 %
Neutro Abs: 5728 cells/uL (ref 1500–7800)
Neutrophils Relative %: 72.5 %
Platelets: 272 10*3/uL (ref 140–400)
RBC: 4.62 10*6/uL (ref 4.20–5.80)
RDW: 15.1 % — ABNORMAL HIGH (ref 11.0–15.0)
Total Lymphocyte: 16.3 %
WBC: 7.9 10*3/uL (ref 3.8–10.8)

## 2019-11-03 LAB — MICROALBUMIN / CREATININE URINE RATIO
Creatinine, Urine: 204 mg/dL (ref 20–320)
Microalb Creat Ratio: 15 mcg/mg creat (ref <30)
Microalb, Ur: 3.1 mg/dL

## 2019-11-03 LAB — COMPLETE METABOLIC PANEL WITH GFR
AG Ratio: 1.2 (calc) (ref 1.0–2.5)
ALT: 22 U/L (ref 9–46)
AST: 21 U/L (ref 10–35)
Albumin: 4.3 g/dL (ref 3.6–5.1)
Alkaline phosphatase (APISO): 101 U/L (ref 35–144)
BUN: 18 mg/dL (ref 7–25)
CO2: 26 mmol/L (ref 20–32)
Calcium: 9.9 mg/dL (ref 8.6–10.3)
Chloride: 104 mmol/L (ref 98–110)
Creat: 1.15 mg/dL (ref 0.70–1.25)
GFR, Est African American: 75 mL/min/{1.73_m2} (ref >=60)
GFR, Est Non African American: 65 mL/min/{1.73_m2} (ref >=60)
Globulin: 3.7 g/dL (calc) (ref 1.9–3.7)
Glucose, Bld: 146 mg/dL — ABNORMAL HIGH (ref 65–99)
Potassium: 3.7 mmol/L (ref 3.5–5.3)
Sodium: 142 mmol/L (ref 135–146)
Total Bilirubin: 0.6 mg/dL (ref 0.2–1.2)
Total Protein: 8 g/dL (ref 6.1–8.1)

## 2019-11-03 LAB — URINALYSIS, ROUTINE W REFLEX MICROSCOPIC
Bacteria, UA: NONE SEEN /HPF
Bilirubin Urine: NEGATIVE
Hgb urine dipstick: NEGATIVE
Hyaline Cast: NONE SEEN /LPF
Leukocytes,Ua: NEGATIVE
Nitrite: NEGATIVE
Specific Gravity, Urine: 1.022 (ref 1.001–1.03)
pH: 6 (ref 5.0–8.0)

## 2019-11-03 LAB — URIC ACID: Uric Acid, Serum: 5.1 mg/dL (ref 4.0–8.0)

## 2019-11-03 LAB — HEMOGLOBIN A1C
Hgb A1c MFr Bld: 7.8 % of total Hgb — ABNORMAL HIGH (ref <5.7)
Mean Plasma Glucose: 177 (calc)
eAG (mmol/L): 9.8 (calc)

## 2019-11-03 LAB — LIPID PANEL
Cholesterol: 103 mg/dL (ref <200)
HDL: 40 mg/dL (ref >=40)
LDL Cholesterol (Calc): 44 mg/dL (calc)
Non-HDL Cholesterol (Calc): 63 mg/dL (calc) (ref <130)
Total CHOL/HDL Ratio: 2.6 (calc) (ref <5.0)
Triglycerides: 111 mg/dL (ref <150)

## 2019-11-03 LAB — PSA: PSA: 0.4 ng/mL (ref <=4.0)

## 2019-11-03 LAB — TSH: TSH: 3.86 mIU/L (ref 0.40–4.50)

## 2019-11-03 LAB — INSULIN, RANDOM: Insulin: 13.2 u[IU]/mL

## 2019-11-03 LAB — MAGNESIUM: Magnesium: 2 mg/dL (ref 1.5–2.5)

## 2019-11-03 LAB — VITAMIN D 25 HYDROXY (VIT D DEFICIENCY, FRACTURES): Vit D, 25-Hydroxy: 63 ng/mL (ref 30–100)

## 2019-11-06 ENCOUNTER — Other Ambulatory Visit: Payer: Self-pay

## 2019-11-06 ENCOUNTER — Ambulatory Visit: Payer: Medicare Other | Admitting: *Deleted

## 2019-11-06 DIAGNOSIS — Z5181 Encounter for therapeutic drug level monitoring: Secondary | ICD-10-CM | POA: Diagnosis not present

## 2019-11-06 DIAGNOSIS — I4819 Other persistent atrial fibrillation: Secondary | ICD-10-CM | POA: Diagnosis not present

## 2019-11-06 LAB — POCT INR: INR: 2.1 (ref 2.0–3.0)

## 2019-11-06 NOTE — Patient Instructions (Signed)
Description   Continue on same dosage 1.5 tablets daily except 1 tablet on Sundays and Thursdays. Recheck INR in 8 weeks. Coumadin Clinic: [phone] Main number: [phone]

## 2019-11-10 ENCOUNTER — Other Ambulatory Visit: Payer: Self-pay | Admitting: *Deleted

## 2019-11-10 DIAGNOSIS — N182 Chronic kidney disease, stage 2 (mild): Secondary | ICD-10-CM

## 2019-11-10 DIAGNOSIS — E039 Hypothyroidism, unspecified: Secondary | ICD-10-CM

## 2019-11-10 DIAGNOSIS — I10 Essential (primary) hypertension: Secondary | ICD-10-CM

## 2019-11-10 MED ORDER — LEVOTHYROXINE SODIUM 100 MCG PO TABS: ORAL_TABLET | ORAL | 0 refills | Status: DC

## 2019-11-10 MED ORDER — METFORMIN HCL ER 500 MG PO TB24: ORAL_TABLET | ORAL | 0 refills | Status: DC

## 2019-11-10 MED ORDER — AMLODIPINE BESYLATE 5 MG PO TABS: ORAL_TABLET | ORAL | 0 refills | Status: DC

## 2020-01-03 ENCOUNTER — Other Ambulatory Visit: Payer: Self-pay | Admitting: *Deleted

## 2020-01-03 ENCOUNTER — Other Ambulatory Visit: Payer: Self-pay | Admitting: Internal Medicine

## 2020-01-03 DIAGNOSIS — I10 Essential (primary) hypertension: Secondary | ICD-10-CM

## 2020-01-03 MED ORDER — OLMESARTAN MEDOXOMIL 40 MG PO TABS: ORAL_TABLET | ORAL | 0 refills | Status: DC

## 2020-01-03 MED ORDER — OLMESARTAN MEDOXOMIL 40 MG PO TABS: ORAL_TABLET | ORAL | 3 refills | Status: DC

## 2020-01-04 ENCOUNTER — Ambulatory Visit: Payer: Medicare Other | Admitting: *Deleted

## 2020-01-04 ENCOUNTER — Other Ambulatory Visit: Payer: Self-pay

## 2020-01-04 DIAGNOSIS — Z961 Presence of intraocular lens: Secondary | ICD-10-CM | POA: Diagnosis not present

## 2020-01-04 DIAGNOSIS — Z5181 Encounter for therapeutic drug level monitoring: Secondary | ICD-10-CM

## 2020-01-04 DIAGNOSIS — I4819 Other persistent atrial fibrillation: Secondary | ICD-10-CM

## 2020-01-04 DIAGNOSIS — H43813 Vitreous degeneration, bilateral: Secondary | ICD-10-CM | POA: Diagnosis not present

## 2020-01-04 DIAGNOSIS — H26493 Other secondary cataract, bilateral: Secondary | ICD-10-CM | POA: Diagnosis not present

## 2020-01-04 DIAGNOSIS — H04123 Dry eye syndrome of bilateral lacrimal glands: Secondary | ICD-10-CM | POA: Diagnosis not present

## 2020-01-04 LAB — POCT INR: INR: 2.2 (ref 2.0–3.0)

## 2020-01-04 LAB — HM DIABETES EYE EXAM

## 2020-01-04 NOTE — Patient Instructions (Signed)
Description   Continue taking Warfarin 1.5 tablets daily except 1 tablet on Sundays and Thursdays. Recheck INR in 8 weeks. Coumadin Clinic: [phone] Main number: [phone]

## 2020-01-21 ENCOUNTER — Other Ambulatory Visit: Payer: Self-pay | Admitting: Internal Medicine

## 2020-01-21 DIAGNOSIS — E039 Hypothyroidism, unspecified: Secondary | ICD-10-CM

## 2020-01-21 DIAGNOSIS — M1A9XX0 Chronic gout, unspecified, without tophus (tophi): Secondary | ICD-10-CM

## 2020-01-21 DIAGNOSIS — E1122 Type 2 diabetes mellitus with diabetic chronic kidney disease: Secondary | ICD-10-CM

## 2020-01-21 DIAGNOSIS — N182 Chronic kidney disease, stage 2 (mild): Secondary | ICD-10-CM

## 2020-01-21 DIAGNOSIS — I10 Essential (primary) hypertension: Secondary | ICD-10-CM

## 2020-01-21 MED ORDER — ALLOPURINOL 300 MG PO TABS: ORAL_TABLET | ORAL | 0 refills | Status: DC

## 2020-02-02 DIAGNOSIS — I700 Atherosclerosis of aorta: Secondary | ICD-10-CM | POA: Insufficient documentation

## 2020-02-02 NOTE — Progress Notes (Signed)
FOLLOW UP  Assessment and Plan:   Atherosclerosis of aorta Control blood pressure, cholesterol, glucose, increase exercise.   Afib rate controlled Doing well s/p ablation off AAD therapy Continue coumadin via cardiology for [facility] Continue following up with cardio still unable to tolerate CPAP due to dryness.   CAD Control blood pressure, cholesterol, glucose, increase exercise.  Followed by cardiology  Hypertension Typically well controlled, last in office 120s, defer dose adjustment, recheck at next OV Monitor blood pressure at home; patient to call if consistently greater than 130/80 Continue DASH diet.   Reminder to go to the ER if any CP, SOB, nausea, dizziness, severe HA, changes vision/speech, left arm numbness and tingling and jaw pain.  Cholesterol Currently at LDL goal; continue statin - reduce from Lipitor 80 mg to 40 mg if remains aggressively controlled; diet for trigs discussed Continue low cholesterol diet and exercise.  Check lipid panel.   Diabetes with diabetic chronic kidney disease Continue medication: metformin 2000 mg daily  Continue diet and exercise.  Perform daily foot/skin check, notify office of any concerning changes.  Check A1C  Morbid obesity - BMi 30 + with OSA Long discussion about weight loss, diet, and exercise Recommended diet heavy in fruits and veggies and low in animal meats, cheeses, and dairy products, appropriate calorie intake Discussed ideal weight for height, goal <225lb Patient will work on eating more frequent small, meals, restart exercise with cooler weather, continue to monitor weight at least weekly  Will follow up in 3 months  Vitamin D Def At goal at last visit; continue supplementation to maintain goal of 70-100 Defer Vit D level  Gout Continue allopurinol Diet discussed Check uric acid as needed  Continue diet and meds as discussed. Further disposition pending results of labs. Discussed med's effects  and SE's.   Over 30 minutes of exam, counseling, chart review, and critical decision making was performed.   Future Appointments  Date Time Provider [place]  03/05/2020  3:00 PM [name], MD [facility]  03/05/2020  3:45 PM [facility]  05/07/2020  4:00 PM [name], MD [facility] None  11/06/2020  3:00 PM [name], MD [facility] None    ----------------------------------------------------------------------------------------------------------------------  HPI 69 y.o. male  presents for 3 month follow up on hypertension, cholesterol, diabetes, hypothyroid, morbid obesity and vitamin D deficiency.   Patient has OSA but is intolerant to the mask; weight loss has been emphasized to patient without much progress.   Patient has history of Afib (2016) and had CV x 2 (2016 & 2017) and then Jan 2018 had an Afib Ablation (Dr Curt Bears) without noted recurrence, does remain on coumadin managed by coumadin clinic (couldn't afford xarelto) followed by Dr Fransico Him.  He has ILD followed by Dr. Chase Caller with annual monitoring.   BMI is Body mass index is 37.06 kg/m., he has been working on diet and exercise, not walking as much due to heat making him short of breath,  pushing vegetable intake. His weight is down from 270 lb, he has been pushing vegetable intake.  Wt Readings from Last 3 Encounters:  02/05/20 262 lb (118.8 kg)  11/02/19 258 lb (117 kg)  08/01/19 257 lb (116.6 kg)    He has aortic atherosclerosis per CT chest in 2018.  His blood pressure has been controlled at home (typically 120-130s/70s), today their BP is BP: 130/72  He does workout. He denies chest pain, shortness of breath, dizziness.   He is  on cholesterol medication Atorvastatin 80 mg daily and denies myalgias. His cholesterol is at goal. The cholesterol last visit was:   Lab Results  Component Value Date   CHOL 103 11/02/2019   HDL 40  11/02/2019   LDLCALC 44 11/02/2019   TRIG 111 11/02/2019   CHOLHDL 2.6 11/02/2019    He has been working on diet and exercise for T2 diabetes on metformin 500 mg TID daily, and denies foot ulcerations, increased appetite, nausea, paresthesia of the feet, polydipsia and polyuria. He does check fasting sugars, ranging 110-122. Last A1C in the office was:  Lab Results  Component Value Date   HGBA1C 7.8 (H) 11/02/2019    He has CKD II associated with T2DM and htn monitored at this office; on ARB;  Lab Results  Component Value Date   GFRNONAA 31 11/02/2019   He is on thyroid medication. His medication was not changed last visit, taking 100 mcg daily Lab Results  Component Value Date   TSH 3.86 11/02/2019   Patient is on Vitamin D supplement.   Lab Results  Component Value Date   VD25OH 63 11/02/2019     Patient is on allopurinol for gout and does not report a recent flare.  Lab Results  Component Value Date   LABURIC 5.1 11/02/2019     Current Medications:  Current Outpatient Medications on File Prior to Visit  Medication Sig  . allopurinol (ZYLOPRIM) 300 MG tablet Take 1 tablet Daily to Prevent Gout  . amLODipine (NORVASC) 5 MG tablet Take     1 & 1/2 tablets (7.5 mg)     Daily     for  BP & Heart  . B Complex Vitamins (B COMPLEX PO) Take 1 tablet by mouth daily.  . carvedilol (COREG) 12.5 MG tablet Take 2 tablet 2 x  /day for BP & Heart  . cholecalciferol (VITAMIN D) 1000 UNITS tablet Take 2,000 Units by mouth daily.  . colchicine 0.6 MG tablet Take 1 tablet daily to prevent Gout Attacks  . levothyroxine (SYNTHROID) 100 MCG tablet Take 1 tablet daily on an empty stomach with only water for 30 minutes & no Antacid meds, Calcium or Magnesium for 4 hours & avoid Biotin  . loratadine (CLARITIN) 10 MG tablet Take 10 mg by mouth daily.  . magnesium gluconate (MAGONATE) 500 MG tablet Take 1,000 mg by mouth daily.   . metFORMIN (GLUCOPHAGE-XR) 500 MG 24 hr tablet Take 2 tablets    2  x /day    with Meals for Diabetes  . olmesartan (BENICAR) 40 MG tablet Take 1 tablet Daily for BP  . warfarin (COUMADIN) 5 MG tablet TAKE 1 OR 1 AND 1/2 TABLET BY MOUTH EVERY DAY OR AS DIRECTED BY COUMADIN CLINIC  . atorvastatin (LIPITOR) 80 MG tablet Take 1 tablet (80 mg total) by mouth daily at 6 PM.   No current facility-administered medications on file prior to visit.     Allergies:  Allergies  Allergen Reactions  . Amiodarone Other (See Comments)    Lung toxicity  . Bee Venom Other (See Comments)    Leg went numb     Medical History:  Past Medical History:  Diagnosis Date  . Arthritis    "joints get stiff" (06/11/2016)  . Bee sting allergy   . Bee sting allergy   . CAD (coronary artery disease), native coronary artery 02/12/2016   cath showing 50% LM, 40% distal LAD, 70% ost to prox LCs, 40% mid LAD and 95%  mid to dist RCA s/p PCI with DES x 2 and stage PCI of the LCx/OM.  [name] Carotid artery stenosis    1-39% bilateral by dopplers 08/2017  . History of gout   . Hx of adenomatous colonic polyps 04/10/2008  . Hyperlipidemia   . Hypertension   . Hypothyroidism   . OSA (obstructive sleep apnea)    "tried CPAP; couldn't do it" (06/11/2016)  . Persistent atrial fibrillation ([place])    s/p DCCV 03/2015 with reversion back to atrial fibrillation, loaded with Amio and s/p DCCV to NSR 11/2015.  He is now s/p afib albation.  He had amio lung toxicity and amio was stopped.  . Thyroid disease   . Type II diabetes mellitus ([place])   . Vitamin D deficiency    Family history- Reviewed and unchanged Social history- Reviewed and unchanged   Review of Systems:  Review of Systems  Constitutional: Negative for malaise/fatigue and weight loss.  HENT: Negative for hearing loss and tinnitus.   Eyes: Negative for blurred vision and double vision.  Respiratory: Negative for cough, shortness of breath and wheezing.   Cardiovascular: Negative for chest pain, palpitations, orthopnea, claudication  and leg swelling.  Gastrointestinal: Negative for abdominal pain, blood in stool, constipation, diarrhea, heartburn, melena, nausea and vomiting.  Genitourinary: Negative.   Musculoskeletal: Negative for joint pain and myalgias.  Skin: Negative for rash.  Neurological: Negative for dizziness, tingling, sensory change, weakness and headaches.  Endo/Heme/Allergies: Negative for polydipsia.  Psychiatric/Behavioral: Negative.   All other systems reviewed and are negative.     Physical Exam: BP 130/72   Pulse 68   Temp (!) 97.5 F (36.4 C)   Wt 262 lb (118.8 kg)   SpO2 94%   BMI 37.06 kg/m  Wt Readings from Last 3 Encounters:  02/05/20 262 lb (118.8 kg)  11/02/19 258 lb (117 kg)  08/01/19 257 lb (116.6 kg)   General Appearance: Well nourished, in no apparent distress. Eyes: PERRLA, EOMs, conjunctiva no swelling or erythema Sinuses: No Frontal/maxillary tenderness ENT/Mouth: Ext aud canals clear, TMs without erythema, bulging. No erythema, swelling, or exudate on post pharynx.  Tonsils not swollen or erythematous. Hearing normal.  Neck: Supple, thyroid normal.  Respiratory: Respiratory effort normal, BS equal bilaterally without rales, rhonchi, wheezing or stridor.  Cardio: RRR with no MRGs. Brisk peripheral pulses, scant edema bil ankles Abdomen: Soft, + BS.  Non tender, no guarding, rebound, hernias, masses. Lymphatics: Non tender without lymphadenopathy.  Musculoskeletal: Full ROM, 5/5 strength, Not tested gait Skin: Warm, dry without rashes, lesions, ecchymosis.  Neuro: Cranial nerves intact. No cerebellar symptoms.  Psych: Awake and oriented X 3, normal affect, Insight and Judgment appropriate.    Izora Ribas, NP 4:19 PM [facility]

## 2020-02-05 ENCOUNTER — Encounter: Payer: Self-pay | Admitting: Adult Health

## 2020-02-05 ENCOUNTER — Other Ambulatory Visit: Payer: Self-pay

## 2020-02-05 ENCOUNTER — Ambulatory Visit: Payer: Medicare Other | Admitting: Adult Health

## 2020-02-05 VITALS — BP 130/72 | HR 68 | Temp 97.5°F | Wt 262.0 lb

## 2020-02-05 DIAGNOSIS — E785 Hyperlipidemia, unspecified: Secondary | ICD-10-CM | POA: Diagnosis not present

## 2020-02-05 DIAGNOSIS — E1122 Type 2 diabetes mellitus with diabetic chronic kidney disease: Secondary | ICD-10-CM | POA: Diagnosis not present

## 2020-02-05 DIAGNOSIS — J849 Interstitial pulmonary disease, unspecified: Secondary | ICD-10-CM

## 2020-02-05 DIAGNOSIS — I700 Atherosclerosis of aorta: Secondary | ICD-10-CM | POA: Diagnosis not present

## 2020-02-05 DIAGNOSIS — I4819 Other persistent atrial fibrillation: Secondary | ICD-10-CM | POA: Diagnosis not present

## 2020-02-05 DIAGNOSIS — E039 Hypothyroidism, unspecified: Secondary | ICD-10-CM

## 2020-02-05 DIAGNOSIS — I10 Essential (primary) hypertension: Secondary | ICD-10-CM

## 2020-02-05 DIAGNOSIS — I2510 Atherosclerotic heart disease of native coronary artery without angina pectoris: Secondary | ICD-10-CM | POA: Diagnosis not present

## 2020-02-05 DIAGNOSIS — E1169 Type 2 diabetes mellitus with other specified complication: Secondary | ICD-10-CM

## 2020-02-05 DIAGNOSIS — N182 Chronic kidney disease, stage 2 (mild): Secondary | ICD-10-CM

## 2020-02-05 DIAGNOSIS — E559 Vitamin D deficiency, unspecified: Secondary | ICD-10-CM

## 2020-02-05 DIAGNOSIS — G4733 Obstructive sleep apnea (adult) (pediatric): Secondary | ICD-10-CM

## 2020-02-05 NOTE — Patient Instructions (Addendum)
Goals    . Blood Pressure < 130/80    . Exercise 150 min/wk Moderate Activity    . Weight (lb) < 225 lb (102.1 kg)        High-Fiber Diet Fiber, also called dietary fiber, is a type of carbohydrate that is found in fruits, vegetables, whole grains, and beans. A high-fiber diet can have many health benefits. Your health care provider may recommend a high-fiber diet to help:  Prevent constipation. Fiber can make your bowel movements more regular.  Lower your cholesterol.  Relieve the following conditions: ? Swelling of veins in the anus (hemorrhoids). ? Swelling and irritation (inflammation) of specific areas of the digestive tract (uncomplicated diverticulosis). ? A problem of the large intestine (colon) that sometimes causes pain and diarrhea (irritable bowel syndrome, IBS).  Prevent overeating as part of a weight-loss plan.  Prevent heart disease, type 2 diabetes, and certain cancers. What is my plan? The recommended daily fiber intake in grams (g) includes:  38 g for men age 49 or younger.  30 g for men over age 100.  22 g for women age 5 or younger.  21 g for women over age 42. You can get the recommended daily intake of dietary fiber by:  Eating a variety of fruits, vegetables, grains, and beans.  Taking a fiber supplement, if it is not possible to get enough fiber through your diet. What do I need to know about a high-fiber diet?  It is better to get fiber through food sources rather than from fiber supplements. There is not a lot of research about how effective supplements are.  Always check the fiber content on the nutrition facts label of any prepackaged food. Look for foods that contain 5 g of fiber or more per serving.  Talk with a diet and nutrition specialist (dietitian) if you have questions about specific foods that are recommended or not recommended for your medical condition, especially if those foods are not listed below.  Gradually increase how much  fiber you consume. If you increase your intake of dietary fiber too quickly, you may have bloating, cramping, or gas.  Drink plenty of water. Water helps you to digest fiber. What are tips for following this plan?  Eat a wide variety of high-fiber foods.  Make sure that half of the grains that you eat each day are whole grains.  Eat breads and cereals that are made with whole-grain flour instead of refined flour or white flour.  Eat brown rice, bulgur wheat, or millet instead of white rice.  Start the day with a breakfast that is high in fiber, such as a cereal that contains 5 g of fiber or more per serving.  Use beans in place of meat in soups, salads, and pasta dishes.  Eat high-fiber snacks, such as berries, raw vegetables, nuts, and popcorn.  Choose whole fruits and vegetables instead of processed forms like juice or sauce. What foods can I eat?  Fruits Berries. Pears. Apples. Oranges. Avocado. Prunes and raisins. Dried figs. Vegetables Sweet potatoes. Spinach. Kale. Artichokes. Cabbage. Broccoli. Cauliflower. Green peas. Carrots. Squash. Grains Whole-grain breads. Multigrain cereal. Oats and oatmeal. Brown rice. Barley. Bulgur wheat. [place]. Quinoa. Bran muffins. Popcorn. Rye wafer crackers. Meats and other proteins Navy, kidney, and pinto beans. Soybeans. Split peas. Lentils. Nuts and seeds. Dairy Fiber-fortified yogurt. Beverages Fiber-fortified soy milk. Fiber-fortified orange juice. Other foods Fiber bars. The items listed above may not be a complete list of recommended foods and beverages. Contact  a dietitian for more options. What foods are not recommended? Fruits Fruit juice. Cooked, strained fruit. Vegetables Fried potatoes. Canned vegetables. Well-cooked vegetables. Grains White bread. Pasta made with refined flour. White rice. Meats and other proteins Fatty cuts of meat. Fried chicken or fried fish. Dairy Milk. Yogurt. Cream cheese. Sour cream. Fats and  oils Butters. Beverages Soft drinks. Other foods Cakes and pastries. The items listed above may not be a complete list of foods and beverages to avoid. Contact a dietitian for more information. Summary  Fiber is a type of carbohydrate. It is found in fruits, vegetables, whole grains, and beans.  There are many health benefits of eating a high-fiber diet, such as preventing constipation, lowering blood cholesterol, helping with weight loss, and reducing your risk of heart disease, diabetes, and certain cancers.  Gradually increase your intake of fiber. Increasing too fast can result in cramping, bloating, and gas. Drink plenty of water while you increase your fiber.  The best sources of fiber include whole fruits and vegetables, whole grains, nuts, seeds, and beans. This information is not intended to replace advice given to you by your health care provider. Make sure you discuss any questions you have with your health care provider. Document Revised: 03/15/2017 Document Reviewed: 03/15/2017 Elsevier Patient Education  2020 [employer].

## 2020-02-06 LAB — CBC WITH DIFFERENTIAL/PLATELET
Absolute Monocytes: 730 cells/uL (ref 200–950)
Basophils Absolute: 57 cells/uL (ref 0–200)
Basophils Relative: 0.7 %
Eosinophils Absolute: 279 cells/uL (ref 15–500)
Eosinophils Relative: 3.4 %
HCT: 43.3 % (ref 38.5–50.0)
Hemoglobin: 14.7 g/dL (ref 13.2–17.1)
Lymphs Abs: 1689 cells/uL (ref 850–3900)
MCH: 32.9 pg (ref 27.0–33.0)
MCHC: 33.9 g/dL (ref 32.0–36.0)
MCV: 96.9 fL (ref 80.0–100.0)
MPV: 10.2 fL (ref 7.5–12.5)
Monocytes Relative: 8.9 %
Neutro Abs: 5445 cells/uL (ref 1500–7800)
Neutrophils Relative %: 66.4 %
Platelets: 255 10*3/uL (ref 140–400)
RBC: 4.47 10*6/uL (ref 4.20–5.80)
RDW: 14.4 % (ref 11.0–15.0)
Total Lymphocyte: 20.6 %
WBC: 8.2 10*3/uL (ref 3.8–10.8)

## 2020-02-06 LAB — HEMOGLOBIN A1C
Hgb A1c MFr Bld: 8 % of total Hgb — ABNORMAL HIGH (ref <5.7)
Mean Plasma Glucose: 183 (calc)
eAG (mmol/L): 10.1 (calc)

## 2020-02-06 LAB — COMPLETE METABOLIC PANEL WITH GFR
AG Ratio: 1.3 (calc) (ref 1.0–2.5)
ALT: 22 U/L (ref 9–46)
AST: 22 U/L (ref 10–35)
Albumin: 4.1 g/dL (ref 3.6–5.1)
Alkaline phosphatase (APISO): 108 U/L (ref 35–144)
BUN: 18 mg/dL (ref 7–25)
CO2: 30 mmol/L (ref 20–32)
Calcium: 8.9 mg/dL (ref 8.6–10.3)
Chloride: 100 mmol/L (ref 98–110)
Creat: 1.09 mg/dL (ref 0.70–1.25)
GFR, Est African American: 80 mL/min/{1.73_m2} (ref >=60)
GFR, Est Non African American: 69 mL/min/{1.73_m2} (ref >=60)
Globulin: 3.2 g/dL (calc) (ref 1.9–3.7)
Glucose, Bld: 127 mg/dL — ABNORMAL HIGH (ref 65–99)
Potassium: 3.8 mmol/L (ref 3.5–5.3)
Sodium: 139 mmol/L (ref 135–146)
Total Bilirubin: 0.8 mg/dL (ref 0.2–1.2)
Total Protein: 7.3 g/dL (ref 6.1–8.1)

## 2020-02-06 LAB — LIPID PANEL
Cholesterol: 110 mg/dL (ref <200)
HDL: 37 mg/dL — ABNORMAL LOW (ref >=40)
LDL Cholesterol (Calc): 49 mg/dL (calc)
Non-HDL Cholesterol (Calc): 73 mg/dL (calc) (ref <130)
Total CHOL/HDL Ratio: 3 (calc) (ref <5.0)
Triglycerides: 162 mg/dL — ABNORMAL HIGH (ref <150)

## 2020-02-06 LAB — TSH: TSH: 4.1 mIU/L (ref 0.40–4.50)

## 2020-02-06 LAB — MAGNESIUM: Magnesium: 1.7 mg/dL (ref 1.5–2.5)

## 2020-02-13 NOTE — Progress Notes (Signed)
PATIENT IS AWARE OF LAB RESULTS AND INSTRUCTIONS. -E WELCH

## 2020-02-22 ENCOUNTER — Other Ambulatory Visit: Payer: Self-pay | Admitting: Cardiology

## 2020-03-05 ENCOUNTER — Other Ambulatory Visit: Payer: Self-pay

## 2020-03-05 ENCOUNTER — Ambulatory Visit: Payer: Medicare Other | Admitting: *Deleted

## 2020-03-05 ENCOUNTER — Ambulatory Visit: Payer: Medicare Other | Admitting: Cardiology

## 2020-03-05 DIAGNOSIS — I4819 Other persistent atrial fibrillation: Secondary | ICD-10-CM | POA: Diagnosis not present

## 2020-03-05 DIAGNOSIS — Z5181 Encounter for therapeutic drug level monitoring: Secondary | ICD-10-CM

## 2020-03-05 LAB — POCT INR: INR: 1.8 — AB (ref 2.0–3.0)

## 2020-03-05 NOTE — Patient Instructions (Signed)
Description    Take 2 tablets today and then continue taking Warfarin 1.5 tablets daily except 1 tablet on Sundays and Thursdays. Recheck INR in 4 weeks. Coumadin Clinic: [phone] Main number: [phone]

## 2020-03-13 ENCOUNTER — Encounter: Payer: Self-pay | Admitting: *Deleted

## 2020-03-28 ENCOUNTER — Other Ambulatory Visit: Payer: Self-pay

## 2020-03-28 ENCOUNTER — Encounter: Payer: Self-pay | Admitting: Cardiology

## 2020-03-28 ENCOUNTER — Ambulatory Visit (INDEPENDENT_AMBULATORY_CARE_PROVIDER_SITE_OTHER): Payer: Medicare Other | Admitting: *Deleted

## 2020-03-28 ENCOUNTER — Ambulatory Visit: Payer: Medicare Other | Admitting: Cardiology

## 2020-03-28 VITALS — BP 150/90 | HR 68 | Ht 72.0 in | Wt 259.8 lb

## 2020-03-28 DIAGNOSIS — I2510 Atherosclerotic heart disease of native coronary artery without angina pectoris: Secondary | ICD-10-CM | POA: Diagnosis not present

## 2020-03-28 DIAGNOSIS — E785 Hyperlipidemia, unspecified: Secondary | ICD-10-CM

## 2020-03-28 DIAGNOSIS — I10 Essential (primary) hypertension: Secondary | ICD-10-CM

## 2020-03-28 DIAGNOSIS — Z5181 Encounter for therapeutic drug level monitoring: Secondary | ICD-10-CM | POA: Diagnosis not present

## 2020-03-28 DIAGNOSIS — I6523 Occlusion and stenosis of bilateral carotid arteries: Secondary | ICD-10-CM | POA: Diagnosis not present

## 2020-03-28 DIAGNOSIS — I4819 Other persistent atrial fibrillation: Secondary | ICD-10-CM

## 2020-03-28 LAB — POCT INR: INR: 2.3 (ref 2.0–3.0)

## 2020-03-28 NOTE — Addendum Note (Signed)
Addended by: Loren Racer on: 03/28/2020 03:15 PM   Modules accepted: Orders

## 2020-03-28 NOTE — Patient Instructions (Signed)
Description   Continue taking Warfarin 1.5 tablets daily except 1 tablet on Sundays and Thursdays. Recheck INR in 5 weeks. Coumadin Clinic: [phone] Main number: [phone]

## 2020-03-28 NOTE — Progress Notes (Signed)
Date:  03/28/2020   ID:  Christopher Wade, DOB 06-Jul-1950, MRN [id number]   PCP:  [name], MD  Cardiologist:  Fransico Him, MD  Electrophysiologist:  Constance Haw, MD   Chief Complaint:  Afib, HTN, CAD, HLD  History of Present Illness:    Christopher Wade is a 69 y.o. male  with a hx of persistentatrial fibrillation s/p DCCV11/2016 and again in July 2017 after Amio load. He ultimately underwent afib ablation 06/11/2016. He was found to have early lung toxicity and his Amio was stopped.He also has a history of HTN, dyslipidemia, DM, ASCAD cath showing 50% LM, 40% distal LAD, 70% ost to prox LCX, 40% mid LAD and 95% mid to dist RCA s/p PCI with DES x 2 and stage PCI of the LCx/OM.  He is here today for followup and is doing well.  He has chronic DOE mainly with extreme exertion and has been there for years with no change.  He denies any recent chest pain or pressure, PND, orthopnea,  dizziness, palpitations or syncope.  He occasionally will have some mild LE edema.  He is compliant with his meds and is tolerating meds with no SE.   Prior CV studies:   The following studies were reviewed today:  Labs from PCP  Past Medical History:  Diagnosis Date  . Arthritis    "joints get stiff" (06/11/2016)  . Bee sting allergy   . Bee sting allergy   . CAD (coronary artery disease), native coronary artery 02/12/2016   cath showing 50% LM, 40% distal LAD, 70% ost to prox LCs, 40% mid LAD and 95% mid to dist RCA s/p PCI with DES x 2 and stage PCI of the LCx/OM.  [name] Carotid artery stenosis    1-39% bilateral by dopplers 08/2017  . History of gout   . Hx of adenomatous colonic polyps 04/10/2008  . Hyperlipidemia   . Hypertension   . Hypothyroidism   . OSA (obstructive sleep apnea)    "tried CPAP; couldn't do it" (06/11/2016)  . Persistent atrial fibrillation ([place])    s/p DCCV 03/2015 with reversion back to atrial fibrillation, loaded with Amio and s/p DCCV to NSR 11/2015.  He is  now s/p afib albation.  He had amio lung toxicity and amio was stopped.  . Thyroid disease   . Type II diabetes mellitus ([place])   . Vitamin D deficiency    Past Surgical History:  Procedure Laterality Date  . CARDIAC CATHETERIZATION N/A 10/11/2015   Procedure: Left Heart Cath and Coronary Angiography;  Surgeon: [name], MD;  Location: [place] [facility];  Service: Cardiovascular;  Laterality: N/A;  . CARDIAC CATHETERIZATION N/A 10/11/2015   Procedure: Coronary Stent Intervention;  Surgeon: [name], MD;  Location: [place] [facility];  Service: Cardiovascular;  Laterality: N/A;  . CARDIAC CATHETERIZATION N/A 10/11/2015   Procedure: Intravascular Pressure Wire/FFR Study;  Surgeon: [name], MD;  Location: [place] [facility];  Service: Cardiovascular;  Laterality: N/A;  . CARDIAC CATHETERIZATION N/A 10/14/2015   Procedure: Coronary Stent Intervention;  Surgeon: [name], MD;  Location: [place] [facility];  Service: Cardiovascular;  Laterality: N/A;  . CARDIOVERSION N/A 04/05/2015   Procedure: CARDIOVERSION;  Surgeon: [name], MD;  Location: [facility];  Service: Cardiovascular;  Laterality: N/A;  . CARDIOVERSION N/A 12/09/2015   Procedure: CARDIOVERSION;  Surgeon: [name], MD;  Location: [place];  Service: Cardiovascular;  Laterality: N/A;  .  CARDIOVERSION N/A 03/16/2016   Procedure: CARDIOVERSION;  Surgeon: [name], MD;  Location: [place];  Service: Cardiovascular;  Laterality: N/A;  . CATARACT EXTRACTION Right 2019   Dr. Patrici Ranks  . CORONARY ANGIOPLASTY    . ELECTROPHYSIOLOGIC STUDY N/A 06/11/2016   Procedure: Atrial Fibrillation Ablation;  Surgeon: Will [name], MD;  Location: [place] [facility];  Service: Cardiovascular;  Laterality: N/A;  . TOTAL HIP ARTHROPLASTY Bilateral 2001  . UVULECTOMY       Current Meds  Medication Sig  . allopurinol (ZYLOPRIM) 300 MG tablet Take 1 tablet Daily to Prevent Gout  . amLODipine  (NORVASC) 5 MG tablet Take     1 & 1/2 tablets (7.5 mg)     Daily     for  BP & Heart  . B Complex Vitamins (B COMPLEX PO) Take 1 tablet by mouth daily.  . carvedilol (COREG) 12.5 MG tablet Take 2 tablet 2 x  /day for BP & Heart  . cholecalciferol (VITAMIN D) 1000 UNITS tablet Take 2,000 Units by mouth daily.  . colchicine 0.6 MG tablet Take 1 tablet daily to prevent Gout Attacks  . levothyroxine (SYNTHROID) 100 MCG tablet Take 1 tablet daily on an empty stomach with only water for 30 minutes & no Antacid meds, Calcium or Magnesium for 4 hours & avoid Biotin  . loratadine (CLARITIN) 10 MG tablet Take 10 mg by mouth daily.  . magnesium gluconate (MAGONATE) 500 MG tablet Take 1,000 mg by mouth daily.   . metFORMIN (GLUCOPHAGE-XR) 500 MG 24 hr tablet Take 2 tablets    2 x /day    with Meals for Diabetes  . olmesartan (BENICAR) 40 MG tablet Take 1 tablet Daily for BP  . warfarin (COUMADIN) 5 MG tablet TAKE 1 OR 1 AND 1/2 TABLET BY MOUTH EVERY DAY OR AS DIRECTED BY COUMADIN CLINIC     Allergies:   Amiodarone and Bee venom   Social History   Tobacco Use  . Smoking status: Never Smoker  . Smokeless tobacco: Never Used  Vaping Use  . Vaping Use: Never used  Substance Use Topics  . Alcohol use: No  . Drug use: No     Family Hx: The patient's family history includes Colon cancer in his sister; Lung cancer in his father and mother.  ROS:   Please see the history of present illness.     All other systems reviewed and are negative.   Labs/Other Tests and Data Reviewed:    Recent Labs: 02/05/2020: ALT 22; BUN 18; Creat 1.09; Hemoglobin 14.7; Magnesium 1.7; Platelets 255; Potassium 3.8; Sodium 139; TSH 4.10   Recent Lipid Panel Lab Results  Component Value Date/Time   CHOL 110 02/05/2020 04:49 PM   TRIG 162 (H) 02/05/2020 04:49 PM   HDL 37 (L) 02/05/2020 04:49 PM   CHOLHDL 3.0 02/05/2020 04:49 PM   LDLCALC 49 02/05/2020 04:49 PM    Wt Readings from Last 3 Encounters:  03/28/20 259  lb 12.8 oz (117.8 kg)  02/05/20 262 lb (118.8 kg)  11/02/19 258 lb (117 kg)     Objective:    Vital Signs:  BP (!) 150/90   Pulse 68   Ht 6' (1.829 m)   Wt 259 lb 12.8 oz (117.8 kg)   SpO2 95%   BMI 35.24 kg/m    GEN: Well nourished, well developed in no acute distress HEENT: Normal NECK: No JVD; No carotid bruits LYMPHATICS: No lymphadenopathy CARDIAC:RRR, no murmurs, rubs, gallops RESPIRATORY:  Clear to auscultation without rales, wheezing or rhonchi  ABDOMEN: Soft, non-tender, non-distended MUSCULOSKELETAL:  Trade LE edema; No deformity  SKIN: Warm and dry NEUROLOGIC:  Alert and oriented x 3 PSYCHIATRIC:  Normal affect    ASSESSMENT & PLAN:    1.  ASCAD  -cath 2017 showed 50% LM, 40% distal LAD, 70% ost to prox LCX, 40% mid LAD and 95% mid to dist RCA s/p PCI with DES x 2 and stage PCI of the LCx/OM.  -he denies any anginal symptoms -continue BB and statin  -no ASA due to DOAC   2.  HTN  -Bp borderline but normal at home today at 126/37mmHg -continue Benicar 40 mg daily, carvedilol 25 mg twice daily and amlodipine 7.5 mg daily.   -SCr stable at 1.09 and K+ 3.8 in Aug 2021 -encouraged to follow < 2gm Na diet  3.  Persistent atrial fibrillation  -he is status post [name] ablation in 2018  -amiodarone stopped due to possible lung toxicity.  -he denies any palpitations and is maintaining NSR on exam today -continue Carvedilol and warfarin -denies any bleeding problems on warfarin -Hbg normal at 14.7 in Sept 2021  4.  Bilateral carotid artery stenosis  -Dopplers 09/16/2017 showed 1 to 39% bilateral stenosis.   -repeat dopplers -continue statin  5.  Hyperlipidemia  -LDL goal is less than 70.   -LDL 49 in Sept 2021 -continue Atorvastatin 80mg  daily  6.  Chronic kidney disease stage III  -his last creatinine was stable at 1.19 and this was followed by his PCP.   Medication Adjustments/Labs and Tests Ordered: Current medicines are reviewed at length  with the patient today.  Concerns regarding medicines are outlined above.  Tests Ordered: No orders of the defined types were placed in this encounter.  Medication Changes: No orders of the defined types were placed in this encounter.   Disposition:  Follow up in 1 year(s)  Signed, Fransico Him, MD  03/28/2020 3:06 PM    [place]

## 2020-03-28 NOTE — Patient Instructions (Signed)
Medication Instructions:  Your physician recommends that you continue on your current medications as directed. Please refer to the Current Medication list given to you today.  *If you need a refill on your cardiac medications before your next appointment, please call your pharmacy*   Lab Work: None If you have labs (blood work) drawn today and your tests are completely normal, you will receive your results only by: [name] MyChart Message (if you have MyChart) OR . A paper copy in the mail If you have any lab test that is abnormal or we need to change your treatment, we will call you to review the results.   Testing/Procedures: Your physician has requested that you have a carotid duplex. This test is an ultrasound of the carotid arteries in your neck. It looks at blood flow through these arteries that supply the brain with blood. Allow one hour for this exam. There are no restrictions or special instructions.    Follow-Up: At [facility], you and your health needs are our priority.  As part of our continuing mission to provide you with exceptional heart care, we have created designated Provider Care Teams.  These Care Teams include your primary Cardiologist (physician) and Advanced Practice Providers (APPs -  Physician Assistants and Nurse Practitioners) who all work together to provide you with the care you need, when you need it.  We recommend signing up for the patient portal called "MyChart".  Sign up information is provided on this After Visit Summary.  MyChart is used to connect with patients for Virtual Visits (Telemedicine).  Patients are able to view lab/test results, encounter notes, upcoming appointments, etc.  Non-urgent messages can be sent to your provider as well.   To learn more about what you can do with MyChart, go to [email or web address].    Your next appointment:   12 month(s)  The format for your next appointment:   In Person  Provider:   You may see Fransico Him,  MD or one of the following Advanced Practice Providers on your designated Care Team:    Melina Copa, PA-C  Ermalinda Barrios, PA-C    Other Instructions

## 2020-04-09 ENCOUNTER — Other Ambulatory Visit: Payer: Self-pay | Admitting: Internal Medicine

## 2020-04-09 ENCOUNTER — Other Ambulatory Visit: Payer: Self-pay | Admitting: Cardiology

## 2020-04-09 DIAGNOSIS — E1122 Type 2 diabetes mellitus with diabetic chronic kidney disease: Secondary | ICD-10-CM

## 2020-04-09 DIAGNOSIS — E039 Hypothyroidism, unspecified: Secondary | ICD-10-CM

## 2020-04-09 DIAGNOSIS — M1A9XX0 Chronic gout, unspecified, without tophus (tophi): Secondary | ICD-10-CM

## 2020-04-09 DIAGNOSIS — N182 Chronic kidney disease, stage 2 (mild): Secondary | ICD-10-CM

## 2020-04-09 DIAGNOSIS — I10 Essential (primary) hypertension: Secondary | ICD-10-CM

## 2020-04-11 ENCOUNTER — Other Ambulatory Visit: Payer: Self-pay

## 2020-04-11 ENCOUNTER — Ambulatory Visit (HOSPITAL_COMMUNITY)
Admission: RE | Admit: 2020-04-11 | Discharge: 2020-04-11 | Disposition: A | Discharge status: Home / Self Care | Payer: Medicare Other | Source: Ambulatory Visit | Attending: Cardiovascular Disease | Admitting: Cardiovascular Disease

## 2020-04-11 DIAGNOSIS — I6523 Occlusion and stenosis of bilateral carotid arteries: Secondary | ICD-10-CM | POA: Insufficient documentation

## 2020-05-02 ENCOUNTER — Ambulatory Visit: Payer: Medicare Other | Admitting: Pharmacist

## 2020-05-02 ENCOUNTER — Other Ambulatory Visit: Payer: Self-pay

## 2020-05-02 DIAGNOSIS — Z5181 Encounter for therapeutic drug level monitoring: Secondary | ICD-10-CM | POA: Diagnosis not present

## 2020-05-02 DIAGNOSIS — I4819 Other persistent atrial fibrillation: Secondary | ICD-10-CM | POA: Diagnosis not present

## 2020-05-02 LAB — POCT INR: INR: 3.1 — AB (ref 2.0–3.0)

## 2020-05-02 NOTE — Patient Instructions (Signed)
Description   Eat some extra greens today. Continue taking Warfarin 1.5 tablets daily except 1 tablet on Sundays and Thursdays. Recheck INR in 5 weeks. Coumadin Clinic: [phone] Main number: [phone]

## 2020-05-07 ENCOUNTER — Encounter: Payer: Self-pay | Admitting: Internal Medicine

## 2020-05-07 ENCOUNTER — Other Ambulatory Visit: Payer: Self-pay

## 2020-05-07 ENCOUNTER — Ambulatory Visit: Payer: Medicare Other | Admitting: Internal Medicine

## 2020-05-07 VITALS — BP 142/76 | HR 63 | Temp 97.4°F | Resp 16 | Ht 72.0 in | Wt 258.4 lb

## 2020-05-07 DIAGNOSIS — N182 Chronic kidney disease, stage 2 (mild): Secondary | ICD-10-CM

## 2020-05-07 DIAGNOSIS — E559 Vitamin D deficiency, unspecified: Secondary | ICD-10-CM

## 2020-05-07 DIAGNOSIS — Z23 Encounter for immunization: Secondary | ICD-10-CM

## 2020-05-07 DIAGNOSIS — E785 Hyperlipidemia, unspecified: Secondary | ICD-10-CM | POA: Diagnosis not present

## 2020-05-07 DIAGNOSIS — E1169 Type 2 diabetes mellitus with other specified complication: Secondary | ICD-10-CM | POA: Diagnosis not present

## 2020-05-07 DIAGNOSIS — Z79899 Other long term (current) drug therapy: Secondary | ICD-10-CM

## 2020-05-07 DIAGNOSIS — I10 Essential (primary) hypertension: Secondary | ICD-10-CM

## 2020-05-07 DIAGNOSIS — S41131A Puncture wound without foreign body of right upper arm, initial encounter: Secondary | ICD-10-CM

## 2020-05-07 DIAGNOSIS — M1A9XX0 Chronic gout, unspecified, without tophus (tophi): Secondary | ICD-10-CM

## 2020-05-07 DIAGNOSIS — E1122 Type 2 diabetes mellitus with diabetic chronic kidney disease: Secondary | ICD-10-CM

## 2020-05-07 DIAGNOSIS — E039 Hypothyroidism, unspecified: Secondary | ICD-10-CM

## 2020-05-07 NOTE — Patient Instructions (Signed)

## 2020-05-07 NOTE — Progress Notes (Signed)
History of Present Illness:       This very nice 69 y.o. single WM presents for 3 month follow up with HTN, ASHD/pAfib, HLD, T2_NIDDM and Vitamin D Deficiency.  Patient also has hx/o OSA and was intolerant of CPAP. Patient also has concerns re: a scratch wound of his right arm that appears inflamed.       Patient is treated for HTN & BP has been controlled at home. Today's BP is borderline elevated at 142/76.  In 2016, he was dx'd in Afib and in 2016 and 2017, he failed CV x 2. In 2018 , he had an Ablation. He was treated with Amiodarione which was stopped when he developed Pulmonary toxicity. Patient is on Coumadin followed at [facility].  Patient has had no complaints of any cardiac type chest pain, palpitations, dyspnea / orthopnea / PND, dizziness, claudication, or dependent edema.      Hyperlipidemia is controlled with diet & meds. Patient denies myalgias or other med SE's. Last Lipids were at goal except slightly elevated Trig's:  Lab Results  Component Value Date   CHOL 110 02/05/2020   HDL 37 (L) 02/05/2020   LDLCALC 49 02/05/2020   TRIG 162 (H) 02/05/2020   CHOLHDL 3.0 02/05/2020    Also, the patient has Morbid Obesity (BMI 35+)  and T2_NIDDM  (2012) and has had no symptoms of reactive hypoglycemia, diabetic polys, paresthesias or visual blurring.  Last A1c was not at goal:  Lab Results  Component Value Date   HGBA1C 8.0 (H) 02/05/2020                                   Patient has been on  Thyroid Replacement since 2011.        Further, the patient also has history of Vitamin D Deficiency   and supplements vitamin D without any suspected side-effects. Last vitamin D was  Lab Results  Component Value Date   VD25OH 63 11/02/2019    Current Outpatient Medications on File Prior to Visit  Medication Sig  . allopurinol (ZYLOPRIM) 300 MG tablet Take      1 tablet       Daily        to Prevent Gout  . amLODipine (NORVASC) 5 MG tablet Take      1 &1/2  tablets (7.5 mg)        Daily     For BP & Heart  . atorvastatin (LIPITOR) 80 MG tablet TAKE 1 TABLET(80 MG) BY MOUTH DAILY AT 6 PM  . B Complex Vitamins (B COMPLEX PO) Take 1 tablet by mouth daily.  . carvedilol (COREG) 12.5 MG tablet Take 2 tablet 2 x  /day for BP & Heart  . cholecalciferol (VITAMIN D) 1000 UNITS tablet Take 2,000 Units by mouth daily.  . colchicine 0.6 MG tablet Take 1 tablet daily to prevent Gout Attacks  . levothyroxine (SYNTHROID) 100 MCG tablet Take      1 tablet       Daily       on an empty stomach with only water for 30 minutes & no Antacid meds, Calcium or Magnesium for 4 hours & avoid Biotin  . loratadine (CLARITIN) 10 MG tablet Take 10 mg by mouth daily.  . magnesium gluconate (MAGONATE) 500 MG tablet Take 1,000 mg by mouth daily.   . metFORMIN (GLUCOPHAGE-XR) 500 MG 24  hr tablet Take     2 tablets     2 x /day     with Meals       for Diabetes  . olmesartan (BENICAR) 40 MG tablet Take 1 tablet Daily for BP  . warfarin (COUMADIN) 5 MG tablet TAKE 1 OR 1 AND 1/2 TABLET BY MOUTH EVERY DAY OR AS DIRECTED BY COUMADIN CLINIC    Allergies  Allergen Reactions  . Amiodarone Other (See Comments)    Lung toxicity  . Bee Venom Other (See Comments)    Leg went numb    PMHx:   Past Medical History:  Diagnosis Date  . Arthritis    "joints get stiff" (06/11/2016)  . Bee sting allergy   . Bee sting allergy   . CAD (coronary artery disease), native coronary artery 02/12/2016   cath showing 50% LM, 40% distal LAD, 70% ost to prox LCs, 40% mid LAD and 95% mid to dist RCA s/p PCI with DES x 2 and stage PCI of the LCx/OM.  [name] Carotid artery stenosis    1-39% bilateral by dopplers 08/2017  . History of gout   . Hx of adenomatous colonic polyps 04/10/2008  . Hyperlipidemia   . Hypertension   . Hypothyroidism   . OSA (obstructive sleep apnea)    "tried CPAP; couldn't do it" (06/11/2016)  . Persistent atrial fibrillation ([place])    s/p DCCV 03/2015 with reversion back to  atrial fibrillation, loaded with Amio and s/p DCCV to NSR 11/2015.  He is now s/p afib albation.  He had amio lung toxicity and amio was stopped.  . Thyroid disease   . Type II diabetes mellitus ([place])   . Vitamin D deficiency     Immunization History  Administered Date(s) Administered  . Influenza Split 04/05/2014  . Influenza, High Dose Seasonal PF 03/31/2018, 03/20/2019  . Influenza-Unspecified 02/02/2013, 05/21/2016  . PFIZER SARS-COV-2 Vaccination 12/13/2019, 01/03/2020  . Pneumococcal Conjugate-13 05/01/2014  . Pneumococcal Polysaccharide-23 05/16/2015  . Td 04/25/2010  . Varicella Zoster Immune Globulin 02/22/2014    Past Surgical History:  Procedure Laterality Date  . CARDIAC CATHETERIZATION N/A 10/11/2015   Procedure: Left Heart Cath and Coronary Angiography;  Surgeon: [name], MD;  Location: [facility];  Service: Cardiovascular;  Laterality: N/A;  . CARDIAC CATHETERIZATION N/A 10/11/2015   Procedure: Coronary Stent Intervention;  Surgeon: [name], MD;  Location: [facility];  Service: Cardiovascular;  Laterality: N/A;  . CARDIAC CATHETERIZATION N/A 10/11/2015   Procedure: Intravascular Pressure Wire/FFR Study;  Surgeon: [name], MD;  Location: [facility];  Service: Cardiovascular;  Laterality: N/A;  . CARDIAC CATHETERIZATION N/A 10/14/2015   Procedure: Coronary Stent Intervention;  Surgeon: [name], MD;  Location: [facility];  Service: Cardiovascular;  Laterality: N/A;  . CARDIOVERSION N/A 04/05/2015   Procedure: CARDIOVERSION;  Surgeon: [name], MD;  Location: [facility];  Service: Cardiovascular;  Laterality: N/A;  . CARDIOVERSION N/A 12/09/2015   Procedure: CARDIOVERSION;  Surgeon: [name], MD;  Location: [facility];  Service: Cardiovascular;  Laterality: N/A;  . CARDIOVERSION N/A 03/16/2016   Procedure: CARDIOVERSION;  Surgeon: [name], MD;  Location: [facility];  Service:  Cardiovascular;  Laterality: N/A;  . CATARACT EXTRACTION Right 2019   Dr. Patrici Ranks  . CORONARY ANGIOPLASTY    . ELECTROPHYSIOLOGIC STUDY N/A 06/11/2016   Procedure: Atrial Fibrillation Ablation;  Surgeon: Will [name], MD;  Location: [facility];  Service: Cardiovascular;  Laterality: N/A;  . TOTAL HIP ARTHROPLASTY Bilateral 2001  . UVULECTOMY      FHx:    Reviewed / unchanged  SHx:    Reviewed / unchanged   Systems Review:  Constitutional: Denies fever, chills, wt changes, headaches, insomnia, fatigue, night sweats, change in appetite. Eyes: Denies redness, blurred vision, diplopia, discharge, itchy, watery eyes.  ENT: Denies discharge, congestion, post nasal drip, epistaxis, sore throat, earache, hearing loss, dental pain, tinnitus, vertigo, sinus pain, snoring.  CV: Denies chest pain, palpitations, irregular heartbeat, syncope, dyspnea, diaphoresis, orthopnea, PND, claudication or edema. Respiratory: denies cough, dyspnea, DOE, pleurisy, hoarseness, laryngitis, wheezing.  Gastrointestinal: Denies dysphagia, odynophagia, heartburn, reflux, water brash, abdominal pain or cramps, nausea, vomiting, bloating, diarrhea, constipation, hematemesis, melena, hematochezia  or hemorrhoids. Genitourinary: Denies dysuria, frequency, urgency, nocturia, hesitancy, discharge, hematuria or flank pain. Musculoskeletal: Denies arthralgias, myalgias, stiffness, jt. swelling, pain, limping or strain/sprain.  Skin: Denies pruritus, rash, hives, warts, acne, eczema or change in skin lesion(s). Neuro: No weakness, tremor, incoordination, spasms, paresthesia or pain. Psychiatric: Denies confusion, memory loss or sensory loss. Endo: Denies change in weight, skin or hair change.  Heme/Lymph: No excessive bleeding, bruising or enlarged lymph nodes.  Physical Exam  BP (!) 142/76   Pulse 63   Temp (!) 97.4 F (36.3 C)   Resp 16   Ht 6' (1.829 m)   Wt 258 lb 6.4 oz (117.2 kg)   SpO2 95%   BMI  35.05 kg/m   Appears  well nourished, well groomed  and in no distress.  Eyes: PERRLA, EOMs, conjunctiva no swelling or erythema. Sinuses: No frontal/maxillary tenderness ENT/Mouth: EAC's clear, TM's nl w/o erythema, bulging. Nares clear w/o erythema, swelling, exudates. Oropharynx clear without erythema or exudates. Oral hygiene is good. Tongue normal, non obstructing. Hearing intact.  Neck: Supple. Thyroid not palpable. Car 2+/2+ without bruits, nodes or JVD. Chest: Respirations nl with BS clear & equal w/o rales, rhonchi, wheezing or stridor.  Cor: Heart sounds normal w/ regular rate and rhythm without sig. murmurs, gallops, clicks or rubs. Peripheral pulses normal and equal  without edema.  Abdomen: Soft & bowel sounds normal. Non-tender w/o guarding, rebound, hernias, masses or organomegaly.  Lymphatics: Unremarkable.  Musculoskeletal: Full ROM all peripheral extremities, joint stability, 5/5 strength and normal gait.  Skin: Warm, dry without exposed rashesor ecchymosis apparent. There is a linear inflamed deep scratch approx 4 "  of the Rt dorsal forearm.  Neuro: Cranial nerves intact, reflexes equal bilaterally. Sensory-motor testing grossly intact. Tendon reflexes grossly intact.  Pysch: Alert & oriented x 3.  Insight and judgement nl & appropriate. No ideations.  Assessment and Plan:  1. Essential hypertension  - Continue medication, monitor blood pressure at home.  - Continue DASH diet.  Reminder to go to the ER if any CP,  SOB, nausea, dizziness, severe HA, changes vision/speech.  - CBC with Differential/Platelet - COMPLETE METABOLIC PANEL WITH GFR - Magnesium - TSH  2. Hyperlipidemia associated with type 2 diabetes mellitus ([place])  - Continue diet/meds, exercise,& lifestyle modifications.  - Continue monitor periodic cholesterol/liver & renal functions   - Lipid panel - TSH  3. Type 2 diabetes mellitus with stage 2 chronic kidney  disease, without long-term current  use of insulin ([facility])  - Hemoglobin A1c - Insulin, random  4. Vitamin D deficiency  - VITAMIN D 25 Hydroxy   5. Chronic gout   - Uric acid  6. Hypothyroidism  - TSH  7. Medication management  -  CBC with Differential/Platelet - COMPLETE METABOLIC PANEL WITH GFR - Magnesium - Lipid panel - TSH - Hemoglobin A1c - Insulin, random - VITAMIN D 25 Hydroxy - Uric acid  8. Need for prophylactic vaccination with tetanus-diphtheria (Td)  - Td : Tetanus/diphtheria >7yo Preservative  free  9. Need for immunization against influenza  - Flu vaccine HIGH DOSE PF (Fluzone High dose)  10. Puncture wound of right upper arm, initial encounter  - Td : Tetanus/diphtheria >7yo Preservative  free        Discussed  regular exercise, BP monitoring, weight control to achieve/maintain BMI less than 25 and discussed med and SE's. Recommended labs to assess and monitor clinical status with further disposition pending results of labs.  I discussed the assessment and treatment plan with the patient. The patient was provided an opportunity to ask questions and all were answered. The patient agreed with the plan and demonstrated an understanding of the instructions.  I provided over 30 minutes of exam, counseling, chart review and  complex critical decision making.  Kirtland Bouchard, MD

## 2020-05-08 LAB — COMPLETE METABOLIC PANEL WITH GFR
AG Ratio: 1.3 (calc) (ref 1.0–2.5)
ALT: 20 U/L (ref 9–46)
AST: 21 U/L (ref 10–35)
Albumin: 4.2 g/dL (ref 3.6–5.1)
Alkaline phosphatase (APISO): 103 U/L (ref 35–144)
BUN: 13 mg/dL (ref 7–25)
CO2: 27 mmol/L (ref 20–32)
Calcium: 9.3 mg/dL (ref 8.6–10.3)
Chloride: 102 mmol/L (ref 98–110)
Creat: 0.89 mg/dL (ref 0.70–1.18)
GFR, Est African American: 100 mL/min/{1.73_m2} (ref >=60)
GFR, Est Non African American: 87 mL/min/{1.73_m2} (ref >=60)
Globulin: 3.3 g/dL (calc) (ref 1.9–3.7)
Glucose, Bld: 122 mg/dL — ABNORMAL HIGH (ref 65–99)
Potassium: 4.5 mmol/L (ref 3.5–5.3)
Sodium: 139 mmol/L (ref 135–146)
Total Bilirubin: 0.6 mg/dL (ref 0.2–1.2)
Total Protein: 7.5 g/dL (ref 6.1–8.1)

## 2020-05-08 LAB — CBC WITH DIFFERENTIAL/PLATELET
Absolute Monocytes: 529 cells/uL (ref 200–950)
Basophils Absolute: 27 cells/uL (ref 0–200)
Basophils Relative: 0.4 %
Eosinophils Absolute: 147 cells/uL (ref 15–500)
Eosinophils Relative: 2.2 %
HCT: 43.8 % (ref 38.5–50.0)
Hemoglobin: 14.7 g/dL (ref 13.2–17.1)
Lymphs Abs: 1796 cells/uL (ref 850–3900)
MCH: 32.4 pg (ref 27.0–33.0)
MCHC: 33.6 g/dL (ref 32.0–36.0)
MCV: 96.5 fL (ref 80.0–100.0)
MPV: 10.3 fL (ref 7.5–12.5)
Monocytes Relative: 7.9 %
Neutro Abs: 4201 cells/uL (ref 1500–7800)
Neutrophils Relative %: 62.7 %
Platelets: 283 10*3/uL (ref 140–400)
RBC: 4.54 10*6/uL (ref 4.20–5.80)
RDW: 15 % (ref 11.0–15.0)
Total Lymphocyte: 26.8 %
WBC: 6.7 10*3/uL (ref 3.8–10.8)

## 2020-05-08 LAB — HEMOGLOBIN A1C
Hgb A1c MFr Bld: 7.3 % of total Hgb — ABNORMAL HIGH (ref <5.7)
Mean Plasma Glucose: 163 mg/dL
eAG (mmol/L): 9 mmol/L

## 2020-05-08 LAB — LIPID PANEL
Cholesterol: 94 mg/dL (ref <200)
HDL: 34 mg/dL — ABNORMAL LOW (ref >=40)
LDL Cholesterol (Calc): 36 mg/dL (calc)
Non-HDL Cholesterol (Calc): 60 mg/dL (calc) (ref <130)
Total CHOL/HDL Ratio: 2.8 (calc) (ref <5.0)
Triglycerides: 159 mg/dL — ABNORMAL HIGH (ref <150)

## 2020-05-08 LAB — MAGNESIUM: Magnesium: 1.9 mg/dL (ref 1.5–2.5)

## 2020-05-08 LAB — INSULIN, RANDOM: Insulin: 14.2 u[IU]/mL

## 2020-05-08 LAB — VITAMIN D 25 HYDROXY (VIT D DEFICIENCY, FRACTURES): Vit D, 25-Hydroxy: 56 ng/mL (ref 30–100)

## 2020-05-08 LAB — URIC ACID: Uric Acid, Serum: 4 mg/dL (ref 4.0–8.0)

## 2020-05-08 LAB — TSH: TSH: 3.6 mIU/L (ref 0.40–4.50)

## 2020-05-08 NOTE — Progress Notes (Signed)
========================================================== ==========================================================  -    Total Chol = 94  and bad LDL Chol = 36 - Both  Excellent   - Very low risk for Heart Attack  / Stroke ========================================================  - Recommend that you cut your Atorvastatin  ( Lipitor dose in 1/2 )  ==========================================================  -  A1c only down slightly from 8.0 to now 7.3% - which is still too high  (Ideal or Goal is less than 5.7%)   So . . . . . . . . . . [name]  It is very important that you work harder with diet by  avoiding all foods that are white except chicken,  fish & calliflower.  - Avoid white rice  (brown & wild rice is OK),   - Avoid white potatoes  (sweet potatoes in moderation is OK),   White bread or wheat bread or anything made out of   white flour like bagels, donuts, rolls, buns, biscuits, cakes,  - pastries, cookies, pizza crust, and pasta (made from  white flour & egg whites)   - vegetarian pasta or spinach or wheat pasta is OK.  - Multigrain breads like Arnold's, Pepperidge Farm or   multigrain sandwich thins or high fiber breads like   [place]ka bread or "Dave's Killer" breads that are  4 to 5 grams fiber per slice !  are best.    - Diet, exercise and weight loss is very important in the   control and prevention of complications of diabetes which  affects every system in your body, ie.   -Brain - dementia/stroke,  - eyes - glaucoma/blindness,  - heart - heart attack/heart failure,  - kidneys - dialysis,  - stomach - gastric paralysis,  - intestines - malabsorption,  - nerves - severe painful neuritis,  - circulation - gangrene & loss of a leg(s)  - and finally  . . . . . . . . . . . . . . . . . .    - cancer and Alzheimers. ==========================================================  -  Vitamin D = 56 - OK   ========================================================== -  Uric Acid / Gout test is Normal - So  please continue Allopurinol same  ==========================================================  -  All Else - CBC - Kidneys - Electrolytes - Liver - Magnesium & Thyroid    - all  Normal / OK ==========================================================

## 2020-05-14 NOTE — Progress Notes (Signed)
PATIENT IS AWARE OF LAB RESULTS AND INSTRUCTIONS. -E WELCH

## 2020-05-26 ENCOUNTER — Inpatient Hospital Stay (HOSPITAL_COMMUNITY)
Admission: EM | Admit: 2020-05-26 | Discharge: 2020-05-29 | DRG: 378 | Disposition: A | Discharge status: Home Health | Payer: Medicare Other | Attending: Internal Medicine | Admitting: Internal Medicine

## 2020-05-26 ENCOUNTER — Other Ambulatory Visit: Payer: Self-pay

## 2020-05-26 ENCOUNTER — Encounter (HOSPITAL_COMMUNITY): Payer: Self-pay | Admitting: Emergency Medicine

## 2020-05-26 DIAGNOSIS — G4733 Obstructive sleep apnea (adult) (pediatric): Secondary | ICD-10-CM | POA: Diagnosis present

## 2020-05-26 DIAGNOSIS — M1990 Unspecified osteoarthritis, unspecified site: Secondary | ICD-10-CM | POA: Diagnosis present

## 2020-05-26 DIAGNOSIS — M109 Gout, unspecified: Secondary | ICD-10-CM | POA: Diagnosis present

## 2020-05-26 DIAGNOSIS — E876 Hypokalemia: Secondary | ICD-10-CM | POA: Diagnosis present

## 2020-05-26 DIAGNOSIS — D500 Iron deficiency anemia secondary to blood loss (chronic): Secondary | ICD-10-CM | POA: Diagnosis present

## 2020-05-26 DIAGNOSIS — E119 Type 2 diabetes mellitus without complications: Secondary | ICD-10-CM

## 2020-05-26 DIAGNOSIS — E1122 Type 2 diabetes mellitus with diabetic chronic kidney disease: Secondary | ICD-10-CM | POA: Diagnosis present

## 2020-05-26 DIAGNOSIS — E559 Vitamin D deficiency, unspecified: Secondary | ICD-10-CM | POA: Diagnosis present

## 2020-05-26 DIAGNOSIS — K921 Melena: Secondary | ICD-10-CM | POA: Diagnosis not present

## 2020-05-26 DIAGNOSIS — K264 Chronic or unspecified duodenal ulcer with hemorrhage: Principal | ICD-10-CM | POA: Diagnosis present

## 2020-05-26 DIAGNOSIS — K922 Gastrointestinal hemorrhage, unspecified: Secondary | ICD-10-CM | POA: Diagnosis not present

## 2020-05-26 DIAGNOSIS — Z888 Allergy status to other drugs, medicaments and biological substances status: Secondary | ICD-10-CM

## 2020-05-26 DIAGNOSIS — N182 Chronic kidney disease, stage 2 (mild): Secondary | ICD-10-CM | POA: Diagnosis present

## 2020-05-26 DIAGNOSIS — R1310 Dysphagia, unspecified: Secondary | ICD-10-CM | POA: Diagnosis present

## 2020-05-26 DIAGNOSIS — I2510 Atherosclerotic heart disease of native coronary artery without angina pectoris: Secondary | ICD-10-CM | POA: Diagnosis present

## 2020-05-26 DIAGNOSIS — Z20822 Contact with and (suspected) exposure to covid-19: Secondary | ICD-10-CM | POA: Diagnosis present

## 2020-05-26 DIAGNOSIS — Z91030 Bee allergy status: Secondary | ICD-10-CM

## 2020-05-26 DIAGNOSIS — N179 Acute kidney failure, unspecified: Secondary | ICD-10-CM | POA: Diagnosis not present

## 2020-05-26 DIAGNOSIS — I10 Essential (primary) hypertension: Secondary | ICD-10-CM | POA: Diagnosis not present

## 2020-05-26 DIAGNOSIS — I959 Hypotension, unspecified: Secondary | ICD-10-CM | POA: Diagnosis not present

## 2020-05-26 DIAGNOSIS — Z79890 Hormone replacement therapy (postmenopausal): Secondary | ICD-10-CM

## 2020-05-26 DIAGNOSIS — I4819 Other persistent atrial fibrillation: Secondary | ICD-10-CM | POA: Diagnosis present

## 2020-05-26 DIAGNOSIS — R600 Localized edema: Secondary | ICD-10-CM | POA: Diagnosis present

## 2020-05-26 DIAGNOSIS — I1310 Hypertensive heart and chronic kidney disease without heart failure, with stage 1 through stage 4 chronic kidney disease, or unspecified chronic kidney disease: Secondary | ICD-10-CM | POA: Diagnosis present

## 2020-05-26 DIAGNOSIS — Z7984 Long term (current) use of oral hypoglycemic drugs: Secondary | ICD-10-CM

## 2020-05-26 DIAGNOSIS — Z7901 Long term (current) use of anticoagulants: Secondary | ICD-10-CM

## 2020-05-26 DIAGNOSIS — R109 Unspecified abdominal pain: Secondary | ICD-10-CM

## 2020-05-26 DIAGNOSIS — Z79899 Other long term (current) drug therapy: Secondary | ICD-10-CM

## 2020-05-26 DIAGNOSIS — E039 Hypothyroidism, unspecified: Secondary | ICD-10-CM | POA: Diagnosis present

## 2020-05-26 DIAGNOSIS — K449 Diaphragmatic hernia without obstruction or gangrene: Secondary | ICD-10-CM | POA: Diagnosis present

## 2020-05-26 DIAGNOSIS — R52 Pain, unspecified: Secondary | ICD-10-CM | POA: Diagnosis not present

## 2020-05-26 DIAGNOSIS — K2970 Gastritis, unspecified, without bleeding: Secondary | ICD-10-CM

## 2020-05-26 DIAGNOSIS — K269 Duodenal ulcer, unspecified as acute or chronic, without hemorrhage or perforation: Secondary | ICD-10-CM

## 2020-05-26 DIAGNOSIS — E785 Hyperlipidemia, unspecified: Secondary | ICD-10-CM | POA: Diagnosis present

## 2020-05-26 DIAGNOSIS — K2990 Gastroduodenitis, unspecified, without bleeding: Secondary | ICD-10-CM | POA: Diagnosis present

## 2020-05-26 LAB — TYPE AND SCREEN
ABO/RH(D): A NEG
Antibody Screen: NEGATIVE

## 2020-05-26 LAB — CBC
HCT: 41.8 % (ref 39.0–52.0)
Hemoglobin: 14 g/dL (ref 13.0–17.0)
MCH: 32.9 pg (ref 26.0–34.0)
MCHC: 33.5 g/dL (ref 30.0–36.0)
MCV: 98.1 fL (ref 80.0–100.0)
Platelets: 277 10*3/uL (ref 150–400)
RBC: 4.26 MIL/uL (ref 4.22–5.81)
RDW: 16.6 % — ABNORMAL HIGH (ref 11.5–15.5)
WBC: 10.5 10*3/uL (ref 4.0–10.5)
nRBC: 0 % (ref 0.0–0.2)

## 2020-05-26 LAB — COMPREHENSIVE METABOLIC PANEL
ALT: 36 U/L (ref 0–44)
AST: 69 U/L — ABNORMAL HIGH (ref 15–41)
Albumin: 3.6 g/dL (ref 3.5–5.0)
Alkaline Phosphatase: 91 U/L (ref 38–126)
Anion gap: 15 (ref 5–15)
BUN: 32 mg/dL — ABNORMAL HIGH (ref 8–23)
CO2: 24 mmol/L (ref 22–32)
Calcium: 9.3 mg/dL (ref 8.9–10.3)
Chloride: 101 mmol/L (ref 98–111)
Creatinine, Ser: 1.36 mg/dL — ABNORMAL HIGH (ref 0.61–1.24)
GFR, Estimated: 56 mL/min — ABNORMAL LOW (ref >60)
Glucose, Bld: 131 mg/dL — ABNORMAL HIGH (ref 70–99)
Potassium: 3.8 mmol/L (ref 3.5–5.1)
Sodium: 140 mmol/L (ref 135–145)
Total Bilirubin: 1.5 mg/dL — ABNORMAL HIGH (ref 0.3–1.2)
Total Protein: 7.7 g/dL (ref 6.5–8.1)

## 2020-05-26 NOTE — ED Triage Notes (Addendum)
Pt states he was brought in by [employer].  C/o black tarry stools and diarrhea since Wednesday.  Reports generalized abd pain. 20g LAC.

## 2020-05-26 NOTE — ED Triage Notes (Signed)
Patient BIB [facility] from home for GI bleed. Patient reports black tarry stools x3 days.

## 2020-05-27 ENCOUNTER — Inpatient Hospital Stay (HOSPITAL_COMMUNITY): Payer: Medicare Other

## 2020-05-27 DIAGNOSIS — E1122 Type 2 diabetes mellitus with diabetic chronic kidney disease: Secondary | ICD-10-CM

## 2020-05-27 DIAGNOSIS — N179 Acute kidney failure, unspecified: Secondary | ICD-10-CM

## 2020-05-27 DIAGNOSIS — K8020 Calculus of gallbladder without cholecystitis without obstruction: Secondary | ICD-10-CM | POA: Diagnosis not present

## 2020-05-27 DIAGNOSIS — K2990 Gastroduodenitis, unspecified, without bleeding: Secondary | ICD-10-CM | POA: Diagnosis not present

## 2020-05-27 DIAGNOSIS — R600 Localized edema: Secondary | ICD-10-CM

## 2020-05-27 DIAGNOSIS — K3189 Other diseases of stomach and duodenum: Secondary | ICD-10-CM | POA: Diagnosis not present

## 2020-05-27 DIAGNOSIS — E559 Vitamin D deficiency, unspecified: Secondary | ICD-10-CM | POA: Diagnosis not present

## 2020-05-27 DIAGNOSIS — I10 Essential (primary) hypertension: Secondary | ICD-10-CM | POA: Diagnosis not present

## 2020-05-27 DIAGNOSIS — R531 Weakness: Secondary | ICD-10-CM | POA: Diagnosis not present

## 2020-05-27 DIAGNOSIS — N210 Calculus in bladder: Secondary | ICD-10-CM | POA: Diagnosis not present

## 2020-05-27 DIAGNOSIS — K2289 Other specified disease of esophagus: Secondary | ICD-10-CM | POA: Diagnosis not present

## 2020-05-27 DIAGNOSIS — K449 Diaphragmatic hernia without obstruction or gangrene: Secondary | ICD-10-CM | POA: Diagnosis not present

## 2020-05-27 DIAGNOSIS — E039 Hypothyroidism, unspecified: Secondary | ICD-10-CM

## 2020-05-27 DIAGNOSIS — R1030 Lower abdominal pain, unspecified: Secondary | ICD-10-CM

## 2020-05-27 DIAGNOSIS — K209 Esophagitis, unspecified without bleeding: Secondary | ICD-10-CM | POA: Diagnosis not present

## 2020-05-27 DIAGNOSIS — N182 Chronic kidney disease, stage 2 (mild): Secondary | ICD-10-CM

## 2020-05-27 DIAGNOSIS — M109 Gout, unspecified: Secondary | ICD-10-CM | POA: Diagnosis present

## 2020-05-27 DIAGNOSIS — K2270 Barrett's esophagus without dysplasia: Secondary | ICD-10-CM | POA: Diagnosis not present

## 2020-05-27 DIAGNOSIS — E785 Hyperlipidemia, unspecified: Secondary | ICD-10-CM | POA: Diagnosis present

## 2020-05-27 DIAGNOSIS — I1310 Hypertensive heart and chronic kidney disease without heart failure, with stage 1 through stage 4 chronic kidney disease, or unspecified chronic kidney disease: Secondary | ICD-10-CM | POA: Diagnosis present

## 2020-05-27 DIAGNOSIS — I2510 Atherosclerotic heart disease of native coronary artery without angina pectoris: Secondary | ICD-10-CM | POA: Diagnosis present

## 2020-05-27 DIAGNOSIS — K922 Gastrointestinal hemorrhage, unspecified: Secondary | ICD-10-CM | POA: Diagnosis present

## 2020-05-27 DIAGNOSIS — N3289 Other specified disorders of bladder: Secondary | ICD-10-CM | POA: Diagnosis not present

## 2020-05-27 DIAGNOSIS — K921 Melena: Secondary | ICD-10-CM | POA: Diagnosis present

## 2020-05-27 DIAGNOSIS — E876 Hypokalemia: Secondary | ICD-10-CM | POA: Diagnosis not present

## 2020-05-27 DIAGNOSIS — Z20822 Contact with and (suspected) exposure to covid-19: Secondary | ICD-10-CM | POA: Diagnosis not present

## 2020-05-27 DIAGNOSIS — Z7901 Long term (current) use of anticoagulants: Secondary | ICD-10-CM

## 2020-05-27 DIAGNOSIS — Z7984 Long term (current) use of oral hypoglycemic drugs: Secondary | ICD-10-CM | POA: Diagnosis not present

## 2020-05-27 DIAGNOSIS — Z79890 Hormone replacement therapy (postmenopausal): Secondary | ICD-10-CM | POA: Diagnosis not present

## 2020-05-27 DIAGNOSIS — N323 Diverticulum of bladder: Secondary | ICD-10-CM | POA: Diagnosis not present

## 2020-05-27 DIAGNOSIS — K269 Duodenal ulcer, unspecified as acute or chronic, without hemorrhage or perforation: Secondary | ICD-10-CM | POA: Diagnosis not present

## 2020-05-27 DIAGNOSIS — R1310 Dysphagia, unspecified: Secondary | ICD-10-CM | POA: Diagnosis not present

## 2020-05-27 DIAGNOSIS — K2970 Gastritis, unspecified, without bleeding: Secondary | ICD-10-CM | POA: Diagnosis not present

## 2020-05-27 DIAGNOSIS — M1990 Unspecified osteoarthritis, unspecified site: Secondary | ICD-10-CM | POA: Diagnosis present

## 2020-05-27 DIAGNOSIS — G4733 Obstructive sleep apnea (adult) (pediatric): Secondary | ICD-10-CM | POA: Diagnosis present

## 2020-05-27 DIAGNOSIS — K264 Chronic or unspecified duodenal ulcer with hemorrhage: Secondary | ICD-10-CM | POA: Diagnosis not present

## 2020-05-27 DIAGNOSIS — I4819 Other persistent atrial fibrillation: Secondary | ICD-10-CM | POA: Diagnosis not present

## 2020-05-27 DIAGNOSIS — D500 Iron deficiency anemia secondary to blood loss (chronic): Secondary | ICD-10-CM | POA: Diagnosis not present

## 2020-05-27 HISTORY — DX: Acute kidney failure, unspecified: N17.9

## 2020-05-27 LAB — HEMOGLOBIN AND HEMATOCRIT, BLOOD
HCT: 41.6 % (ref 39.0–52.0)
HCT: 38.9 % — ABNORMAL LOW (ref 39.0–52.0)
Hemoglobin: 13.3 g/dL (ref 13.0–17.0)
Hemoglobin: 12.6 g/dL — ABNORMAL LOW (ref 13.0–17.0)

## 2020-05-27 LAB — I-STAT VENOUS BLOOD GAS, ED
Acid-Base Excess: 2 mmol/L (ref 0.0–2.0)
Bicarbonate: 23 mmol/L (ref 20.0–28.0)
Calcium, Ion: 0.84 mmol/L — CL (ref 1.15–1.40)
HCT: 37 % — ABNORMAL LOW (ref 39.0–52.0)
Hemoglobin: 12.6 g/dL — ABNORMAL LOW (ref 13.0–17.0)
O2 Saturation: 95 %
Potassium: 3.2 mmol/L — ABNORMAL LOW (ref 3.5–5.1)
Sodium: 137 mmol/L (ref 135–145)
TCO2: 24 mmol/L (ref 22–32)
pCO2, Ven: 25 mmHg — ABNORMAL LOW (ref 44.0–60.0)
pH, Ven: 7.572 — ABNORMAL HIGH (ref 7.250–7.430)
pO2, Ven: 64 mmHg — ABNORMAL HIGH (ref 32.0–45.0)

## 2020-05-27 LAB — CREATININE, URINE, RANDOM: Creatinine, Urine: 104.87 mg/dL

## 2020-05-27 LAB — URINALYSIS, ROUTINE W REFLEX MICROSCOPIC
Bilirubin Urine: NEGATIVE
Glucose, UA: NEGATIVE mg/dL
Hgb urine dipstick: NEGATIVE
Ketones, ur: 20 mg/dL — AB
Leukocytes,Ua: NEGATIVE
Nitrite: NEGATIVE
Protein, ur: NEGATIVE mg/dL
Specific Gravity, Urine: 1.015 (ref 1.005–1.030)
pH: 6 (ref 5.0–8.0)

## 2020-05-27 LAB — PROTIME-INR
INR: 1.8 — ABNORMAL HIGH (ref 0.8–1.2)
Prothrombin Time: 20.6 seconds — ABNORMAL HIGH (ref 11.4–15.2)

## 2020-05-27 LAB — POC OCCULT BLOOD, ED: Fecal Occult Bld: POSITIVE — AB

## 2020-05-27 LAB — CBG MONITORING, ED
Glucose-Capillary: 119 mg/dL — ABNORMAL HIGH (ref 70–99)
Glucose-Capillary: 130 mg/dL — ABNORMAL HIGH (ref 70–99)

## 2020-05-27 LAB — ECHOCARDIOGRAM COMPLETE
AR max vel: 2.74 cm2
AV Area VTI: 2.6 cm2
AV Area mean vel: 2.63 cm2
AV Mean grad: 3 mmHg
AV Peak grad: 4.9 mmHg
Ao pk vel: 1.11 m/s
Area-P 1/2: 3.21 cm2
S' Lateral: 3.1 cm

## 2020-05-27 LAB — BILIRUBIN, DIRECT: Bilirubin, Direct: 0.2 mg/dL (ref 0.0–0.2)

## 2020-05-27 LAB — GLUCOSE, CAPILLARY: Glucose-Capillary: 113 mg/dL — ABNORMAL HIGH (ref 70–99)

## 2020-05-27 LAB — TROPONIN I (HIGH SENSITIVITY)
Troponin I (High Sensitivity): 10 ng/L (ref <18)
Troponin I (High Sensitivity): 10 ng/L (ref <18)

## 2020-05-27 LAB — SARS CORONAVIRUS 2 (TAT 6-24 HRS): SARS Coronavirus 2: NEGATIVE

## 2020-05-27 LAB — SODIUM, URINE, RANDOM: Sodium, Ur: 102 mmol/L

## 2020-05-27 LAB — HIV ANTIBODY (ROUTINE TESTING W REFLEX): HIV Screen 4th Generation wRfx: NONREACTIVE

## 2020-05-27 LAB — ABO/RH: ABO/RH(D): A NEG

## 2020-05-27 LAB — BRAIN NATRIURETIC PEPTIDE: B Natriuretic Peptide: 86.8 pg/mL (ref 0.0–100.0)

## 2020-05-27 MED ORDER — ALLOPURINOL 300 MG PO TABS
300.0000 mg | ORAL_TABLET | Freq: Every day | ORAL | Status: DC
  Administered 2020-05-27 – 2020-05-29 (×3): 300 mg via ORAL
  Filled 2020-05-27 (×3): qty 1

## 2020-05-27 MED ORDER — ALBUTEROL SULFATE (2.5 MG/3ML) 0.083% IN NEBU: 2.5000 mg | INHALATION_SOLUTION | Freq: Four times a day (QID) | RESPIRATORY_TRACT | Status: DC | PRN

## 2020-05-27 MED ORDER — IOHEXOL 350 MG/ML SOLN
100.0000 mL | Freq: Once | INTRAVENOUS | Status: AC | PRN
  Administered 2020-05-27: 100 mL via INTRAVENOUS

## 2020-05-27 MED ORDER — INSULIN ASPART 100 UNIT/ML ~~LOC~~ SOLN: 0.0000 [IU] | Freq: Every day | SUBCUTANEOUS | Status: DC

## 2020-05-27 MED ORDER — SODIUM CHLORIDE 0.9 % IV SOLN
80.0000 mg | Freq: Once | INTRAVENOUS | Status: AC
  Administered 2020-05-27: 11:00:00 80 mg via INTRAVENOUS
  Filled 2020-05-27: qty 80

## 2020-05-27 MED ORDER — POTASSIUM CHLORIDE CRYS ER 20 MEQ PO TBCR
40.0000 meq | EXTENDED_RELEASE_TABLET | ORAL | Status: AC
  Administered 2020-05-27: 40 meq via ORAL
  Filled 2020-05-27: qty 2

## 2020-05-27 MED ORDER — LEVOTHYROXINE SODIUM 100 MCG PO TABS
100.0000 ug | ORAL_TABLET | Freq: Every day | ORAL | Status: DC
  Administered 2020-05-29: 100 ug via ORAL
  Filled 2020-05-27: qty 1

## 2020-05-27 MED ORDER — PERFLUTREN LIPID MICROSPHERE
1.0000 mL | INTRAVENOUS | Status: AC | PRN
  Administered 2020-05-27: 4 mL via INTRAVENOUS
  Filled 2020-05-27: qty 10

## 2020-05-27 MED ORDER — INSULIN ASPART 100 UNIT/ML ~~LOC~~ SOLN
0.0000 [IU] | Freq: Three times a day (TID) | SUBCUTANEOUS | Status: DC
  Administered 2020-05-28: 3 [IU] via SUBCUTANEOUS
  Administered 2020-05-28 – 2020-05-29 (×2): 2 [IU] via SUBCUTANEOUS

## 2020-05-27 MED ORDER — CARVEDILOL 25 MG PO TABS
25.0000 mg | ORAL_TABLET | Freq: Two times a day (BID) | ORAL | Status: DC
  Administered 2020-05-27 – 2020-05-29 (×4): 25 mg via ORAL
  Filled 2020-05-27: qty 1
  Filled 2020-05-27: qty 2
  Filled 2020-05-27 (×2): qty 1

## 2020-05-27 MED ORDER — PANTOPRAZOLE SODIUM 40 MG IV SOLR: 40.0000 mg | Freq: Two times a day (BID) | INTRAVENOUS | Status: DC

## 2020-05-27 MED ORDER — SODIUM CHLORIDE 0.9% FLUSH
3.0000 mL | Freq: Two times a day (BID) | INTRAVENOUS | Status: DC
  Administered 2020-05-27 – 2020-05-29 (×5): 3 mL via INTRAVENOUS

## 2020-05-27 MED ORDER — CALCIUM GLUCONATE-NACL 1-0.675 GM/50ML-% IV SOLN
1.0000 g | Freq: Once | INTRAVENOUS | Status: AC
  Administered 2020-05-27: 1000 mg via INTRAVENOUS
  Filled 2020-05-27: qty 50

## 2020-05-27 MED ORDER — ONDANSETRON HCL 4 MG/2ML IJ SOLN: 4.0000 mg | Freq: Four times a day (QID) | INTRAMUSCULAR | Status: DC | PRN

## 2020-05-27 MED ORDER — SODIUM CHLORIDE 0.9 % IV SOLN
8.0000 mg/h | INTRAVENOUS | Status: DC
  Administered 2020-05-27 (×2): 8 mg/h via INTRAVENOUS
  Filled 2020-05-27 (×4): qty 80

## 2020-05-27 MED ORDER — ONDANSETRON HCL 4 MG PO TABS: 4.0000 mg | ORAL_TABLET | Freq: Four times a day (QID) | ORAL | Status: DC | PRN

## 2020-05-27 MED ORDER — HYDRALAZINE HCL 20 MG/ML IJ SOLN: 10.0000 mg | INTRAMUSCULAR | Status: DC | PRN

## 2020-05-27 MED ORDER — ATORVASTATIN CALCIUM 40 MG PO TABS
40.0000 mg | ORAL_TABLET | Freq: Every day | ORAL | Status: DC
Start: 2020-05-27 — End: 2020-05-29
  Administered 2020-05-27 – 2020-05-29 (×3): 40 mg via ORAL
  Filled 2020-05-27 (×3): qty 1

## 2020-05-27 MED ORDER — ACETAMINOPHEN 325 MG PO TABS: 650.0000 mg | ORAL_TABLET | Freq: Four times a day (QID) | ORAL | Status: DC | PRN

## 2020-05-27 MED ORDER — SODIUM CHLORIDE 0.9 % IV SOLN: INTRAVENOUS | Status: DC

## 2020-05-27 MED ORDER — AMLODIPINE BESYLATE 5 MG PO TABS
7.5000 mg | ORAL_TABLET | Freq: Every day | ORAL | Status: DC
  Administered 2020-05-27 – 2020-05-29 (×3): 7.5 mg via ORAL
  Filled 2020-05-27: qty 1
  Filled 2020-05-27: qty 2
  Filled 2020-05-27: qty 1

## 2020-05-27 MED ORDER — ACETAMINOPHEN 650 MG RE SUPP: 650.0000 mg | Freq: Four times a day (QID) | RECTAL | Status: DC | PRN

## 2020-05-27 NOTE — Progress Notes (Signed)
  Echocardiogram 2D Echocardiogram has been performed with [facility].  Gerda Diss 05/27/2020, 2:31 PM

## 2020-05-27 NOTE — ED Notes (Signed)
Pt placed on hospital bed for comfort. Pt denies any wants and concerns.

## 2020-05-27 NOTE — ED Provider Notes (Signed)
[facility] EMERGENCY DEPARTMENT Provider Note   CSN: [id number] Arrival date & time: 05/26/20  1348     History Chief Complaint  Patient presents with  . GI Bleeding    Christopher Wade is a 70 y.o. male.   Diarrhea Quality:  Black and tarry Severity:  Severe Onset quality:  Gradual Timing:  Constant Progression:  Unchanged Relieved by:  Nothing Worsened by:  Nothing Ineffective treatments:  None tried Associated symptoms: abdominal pain   Associated symptoms: no arthralgias, no chills, no fever, no headaches and no vomiting   Risk factors comment:  On blood thinners      Past Medical History:  Diagnosis Date  . Arthritis    "joints get stiff" (06/11/2016)  . Bee sting allergy   . Bee sting allergy   . CAD (coronary artery disease), native coronary artery 02/12/2016   cath showing 50% LM, 40% distal LAD, 70% ost to prox LCs, 40% mid LAD and 95% mid to dist RCA s/p PCI with DES x 2 and stage PCI of the LCx/OM.  [name] Carotid artery stenosis    1-39% bilateral by dopplers 08/2017  . History of gout   . Hx of adenomatous colonic polyps 04/10/2008  . Hyperlipidemia   . Hypertension   . Hypothyroidism   . OSA (obstructive sleep apnea)    "tried CPAP; couldn't do it" (06/11/2016)  . Persistent atrial fibrillation ([place])    s/p DCCV 03/2015 with reversion back to atrial fibrillation, loaded with Amio and s/p DCCV to NSR 11/2015.  He is now s/p afib albation.  He had amio lung toxicity and amio was stopped.  . Thyroid disease   . Type II diabetes mellitus ([place])   . Vitamin D deficiency     Patient Active Problem List   Diagnosis Date Noted  . Aortic atherosclerosis ([place]) 02/02/2020  . BPH with obstruction/lower urinary tract symptoms 10/20/2018  . Encounter for therapeutic drug monitoring 09/29/2018  . Carotid artery stenosis   . Type II diabetes mellitus ([place])   . Hypothyroidism   . 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 Persistent atrial fibrillation ([place])   . CAD (coronary artery disease), native coronary artery 02/12/2016  . Encounter for Medicare annual wellness exam 05/16/2015  . OSA (obstructive sleep apnea)   . Gout 11/12/2014  . Essential hypertension 11/12/2014  . Morbid obesity ([place]) - BMI 30+ with OSA 07/31/2013  . Hyperlipidemia associated with type 2 diabetes mellitus ([place])   . CKD stage 2 due to type 2 diabetes mellitus ([place])   . Vitamin D deficiency   . Hx of adenomatous colonic polyps 04/10/2008    Past Surgical History:  Procedure Laterality Date  . CARDIAC CATHETERIZATION N/A 10/11/2015   Procedure: Left Heart Cath and Coronary Angiography;  Surgeon: [name], MD;  Location: [facility];  Service: Cardiovascular;  Laterality: N/A;  . CARDIAC CATHETERIZATION N/A 10/11/2015   Procedure: Coronary Stent Intervention;  Surgeon: [name], MD;  Location: [facility];  Service: Cardiovascular;  Laterality: N/A;  . CARDIAC CATHETERIZATION N/A 10/11/2015   Procedure: Intravascular Pressure Wire/FFR Study;  Surgeon: [name], MD;  Location: [facility];  Service: Cardiovascular;  Laterality: N/A;  . CARDIAC CATHETERIZATION N/A 10/14/2015   Procedure: Coronary Stent Intervention;  Surgeon: [name], MD;  [facility];  Service: Cardiovascular;  Laterality: N/A;  . CARDIOVERSION N/A 04/05/2015   Procedure: CARDIOVERSION;  Surgeon: [name], MD;  Location: [facility];  Service: Cardiovascular;  Laterality: N/A;  . CARDIOVERSION N/A 12/09/2015   Procedure: CARDIOVERSION;  Surgeon: [name], MD;  Location: [facility];  Service: Cardiovascular;  Laterality: N/A;  . CARDIOVERSION N/A 03/16/2016   Procedure: CARDIOVERSION;  Surgeon: Chilton Si, MD;  Location: [facility] ENDOSCOPY;  Service: Cardiovascular;  Laterality: N/A;  .  CATARACT EXTRACTION Right 2019   Dr. Lucious Groves  . CORONARY ANGIOPLASTY    . ELECTROPHYSIOLOGIC STUDY N/A 06/11/2016   Procedure: Atrial Fibrillation Ablation;  Surgeon: Will Jorja Loa, MD;  [facility];  Service: Cardiovascular;  Laterality: N/A;  . TOTAL HIP ARTHROPLASTY Bilateral 2001  . UVULECTOMY         Family History  Problem Relation Age of Onset  . Lung cancer Mother   . Lung cancer Father   . Colon cancer Sister     Social History   Tobacco Use  . Smoking status: Never Smoker  . Smokeless tobacco: Never Used  Vaping Use  . Vaping Use: Never used  Substance Use Topics  . Alcohol use: No  . Drug use: No    Home Medications Prior to Admission medications   Medication Sig Start Date End Date Taking? Authorizing Provider  allopurinol (ZYLOPRIM) 300 MG tablet Take      1 tablet       Daily        to Prevent Gout 04/09/20   [name], MD  amLODipine (NORVASC) 5 MG tablet Take      1 &1/2 tablets (7.5 mg)        Daily     For BP & Heart 04/09/20   [name], MD  atorvastatin (LIPITOR) 80 MG tablet TAKE 1 TABLET(80 MG) BY MOUTH DAILY AT 6 PM 04/09/20   [name], MD  B Complex Vitamins (B COMPLEX PO) Take 1 tablet by mouth daily.    [provider]  carvedilol (COREG) 12.5 MG tablet Take 2 tablet 2 x  /day for BP & Heart 10/26/19   [name], MD  cholecalciferol (VITAMIN D) 1000 UNITS tablet Take 2,000 Units by mouth daily.    [provider]  colchicine 0.6 MG tablet Take 1 tablet daily to prevent Gout Attacks 08/29/18   [name], MD  levothyroxine (SYNTHROID) 100 MCG tablet Take      1 tablet       Daily       on an empty stomach with only water for 30 minutes & no Antacid meds, Calcium or Magnesium for 4 hours & avoid Biotin 04/09/20   [name], MD  loratadine (CLARITIN) 10 MG tablet Take 10 mg by mouth daily.    [provider]  magnesium gluconate (MAGONATE) 500 MG tablet Take 1,000 mg  by mouth daily.     [provider]  metFORMIN (GLUCOPHAGE-XR) 500 MG 24 hr tablet Take     2 tablets     2 x /day     with Meals       for Diabetes 04/09/20   [name], MD  olmesartan [facility]) 40 MG tablet Take 1 tablet Daily for BP 01/03/20   [name], MD  warfarin (COUMADIN) 5 MG tablet TAKE 1 OR 1 AND 1/2 TABLET BY MOUTH EVERY DAY OR AS DIRECTED BY COUMADIN CLINIC 02/22/20   Camnitz,  Ocie Doyne, MD    Allergies    Amiodarone and Bee venom  Review of Systems   Review of Systems  Constitutional: Negative for chills and fever.  HENT: Negative for congestion and rhinorrhea.   Respiratory: Negative for cough and shortness of breath.   Cardiovascular: Negative for chest pain and palpitations.  Gastrointestinal: Positive for abdominal pain, blood in stool and diarrhea. Negative for nausea and vomiting.  Genitourinary: Negative for difficulty urinating and dysuria.  Musculoskeletal: Negative for arthralgias and back pain.  Skin: Negative for color change and rash.  Neurological: Negative for light-headedness and headaches.  Hematological: Bruises/bleeds easily.    Physical Exam Updated Vital Signs BP (!) 170/71   Pulse 73   Temp 99.1 F (37.3 C) (Oral)   Resp 18   SpO2 93%   Physical Exam Vitals and nursing note reviewed.  Constitutional:      General: He is not in acute distress.    Appearance: Normal appearance.  HENT:     Head: Normocephalic and atraumatic.     Nose: No rhinorrhea.     Mouth/Throat:     Mouth: Mucous membranes are moist.  Eyes:     General:        Right eye: No discharge.        Left eye: No discharge.     Conjunctiva/sclera: Conjunctivae normal.  Cardiovascular:     Rate and Rhythm: Normal rate and regular rhythm.  Pulmonary:     Effort: Pulmonary effort is normal.     Breath sounds: No stridor.  Abdominal:     General: Abdomen is flat. There is no distension.     Palpations: Abdomen is soft. There is no mass.      Tenderness: There is abdominal tenderness. There is no right CVA tenderness, left CVA tenderness, guarding or rebound.  Genitourinary:    Rectum: Guaiac result positive.  Musculoskeletal:        General: No deformity or signs of injury.  Skin:    General: Skin is warm and dry.     Capillary Refill: Capillary refill takes less than 2 seconds.  Neurological:     General: No focal deficit present.     Mental Status: He is alert. Mental status is at baseline.     Motor: No weakness.  Psychiatric:        Mood and Affect: Mood normal.        Behavior: Behavior normal.        Thought Content: Thought content normal.     ED Results / Procedures / Treatments   Labs (all labs ordered are listed, but only abnormal results are displayed) Labs Reviewed  COMPREHENSIVE METABOLIC PANEL - Abnormal; Notable for the following components:      Result Value   Glucose, Bld 131 (*)    BUN 32 (*)    Creatinine, Ser 1.36 (*)    AST 69 (*)    Total Bilirubin 1.5 (*)    GFR, Estimated 56 (*)    All other components within normal limits  CBC - Abnormal; Notable for the following components:   RDW 16.6 (*)    All other components within normal limits  PROTIME-INR - Abnormal; Notable for the following components:   Prothrombin Time 20.6 (*)    INR 1.8 (*)    All other components within normal limits  POC OCCULT BLOOD, ED - Abnormal; Notable for the following components:   Fecal Occult Bld POSITIVE (*)    All  other components within normal limits  SARS CORONAVIRUS 2 (TAT 6-24 HRS)  BRAIN NATRIURETIC PEPTIDE  TYPE AND SCREEN  ABO/RH  TROPONIN I (HIGH SENSITIVITY)  TROPONIN I (HIGH SENSITIVITY)    EKG None  Radiology No results found.  Procedures Procedures (including critical care time)  Medications Ordered in ED Medications - No data to display  ED Course  I have reviewed the triage vital signs and the nursing notes.  Pertinent labs & imaging results that were available during my  care of the patient were reviewed by me and considered in my medical decision making (see chart for details).    MDM Rules/Calculators/A&P                          70 year old male has had black tarry stools for several days.  On blood thinners, Coumadin for his heart.  We will check a level.  His hemoglobin is a stable his blood pressure is stable his heart rate is normal however his heart rate is controlled with beta-blocker.  Hemoccult positive as well as melena on my digital exam.  Will likely need admission.  Does have severe pitting edema and exertional dyspnea lately.  No history of heart failure.  Will get labs to screen for heart failure.  We will also get Covid testing.  Patient's INR is subtherapeutic, in the setting of GI bleed we will leave that B.  Will admit this patient to medicine for possible gastroenterology consultation.  Patient's vital signs remained stable still waiting on heart failure related labs, mildly elevated creatinine, possibly in the setting of GI bleed, possible need for intervention versus consultation there as well.  Final Clinical Impression(s) / ED Diagnoses Final diagnoses:  Gastrointestinal hemorrhage, unspecified gastrointestinal hemorrhage type  Melena  AKI (acute kidney injury) ([place])    Rx / DC Orders ED Discharge Orders    None       Breck Coons, MD 05/27/20 [phone]

## 2020-05-27 NOTE — H&P (Addendum)
History and Physical    Christopher Wade [id number] DOB: 1951/02/18 DOA: 05/26/2020  Referring MD/NP/PA: Dewaine Conger, MD PCP: [name], MD  Patient coming from: Home via EMS  Chief Complaint: "Black stools"  I have personally briefly reviewed patient's old medical records in [place]   HPI: Christopher Wade is a 70 y.o. male with medical history significant of hypertension, hyperlipidemia, persistent atrial fibrillation on Coumadin, diabetes mellitus type 2, and hypothyroidism presents with complaints of black stools for the last 5 days.  He is unsure what onset symptoms.  Reports having several loose stools per day that were all black in appearance.  Bowel movements were occurring so frequently that he sometimes is having accidents and started wearing adult briefs.  Associated symptoms included crampy abdominal pain, headache, lightheadedness, insomnia, lower extremity swelling, and decreased appetite.  He feels that he has lost probably 8 pounds but has not recently checked.  The abdominal pain is severe and located mostly in the left lower quadrant of his abdomen.  Denies having any fever, cough, nausea, vomiting, loss of consciousness, or chest pain.  He has never had any symptoms like this previously in the past.  Patient does not use any NSAIDs on, but is on Coumadin.  Last reports taking Coumadin 2 days ago. Last colonoscopy was in 2019 with Dr. Carlean Purl revealed sessile polyp which was removed and internal hemorrhoids.    ED Course: Upon admission into the emergency department patient was seen to be afebrile with respirations 18-24, blood pressures 134/62 171/61, and O2 saturation maintained on room air.  Labs significant for WBC 10.5, hemoglobin 14, BUN 32, creatinine 1.36, and INR 1.86.  Stool guaiacs were noted to be positive.  He was typed and screened for possible need of blood products.  TRH called to admit.  Patient has had 3 bowel movements since coming into the hospital  yesterday.  Review of Systems  Constitutional: Positive for malaise/fatigue. Negative for fever.  HENT: Negative for ear discharge and nosebleeds.   Eyes: Negative for photophobia and pain.  Respiratory: Negative for cough and shortness of breath.   Cardiovascular: Positive for leg swelling. Negative for chest pain and orthopnea.  Gastrointestinal: Positive for abdominal pain, diarrhea and melena. Negative for nausea and vomiting.  Genitourinary: Negative for dysuria and hematuria.  Musculoskeletal: Negative for falls and joint pain.  Skin: Negative for itching and rash.  Neurological: Positive for dizziness and headaches. Negative for loss of consciousness.  Endo/Heme/Allergies: Negative for polydipsia.  Psychiatric/Behavioral: Negative for substance abuse. The patient has insomnia.     Past Medical History:  Diagnosis Date  . Arthritis    "joints get stiff" (06/11/2016)  . Bee sting allergy   . Bee sting allergy   . CAD (coronary artery disease), native coronary artery 02/12/2016   cath showing 50% LM, 40% distal LAD, 70% ost to prox LCs, 40% mid LAD and 95% mid to dist RCA s/p PCI with DES x 2 and stage PCI of the LCx/OM.  [name] Carotid artery stenosis    1-39% bilateral by dopplers 08/2017  . History of gout   . Hx of adenomatous colonic polyps 04/10/2008  . Hyperlipidemia   . Hypertension   . Hypothyroidism   . OSA (obstructive sleep apnea)    "tried CPAP; couldn't do it" (06/11/2016)  . Persistent atrial fibrillation ([place])    s/p DCCV 03/2015 with reversion back to atrial fibrillation, loaded with Amio and s/p DCCV to NSR 11/2015.  He is  now s/p afib albation.  He had amio lung toxicity and amio was stopped.  . Thyroid disease   . Type II diabetes mellitus ([place])   . Vitamin D deficiency     Past Surgical History:  Procedure Laterality Date  . CARDIAC CATHETERIZATION N/A 10/11/2015   Procedure: Left Heart Cath and Coronary Angiography;  Surgeon: [name], MD;  Location:  [facility];  Service: Cardiovascular;  Laterality: N/A;  . CARDIAC CATHETERIZATION N/A 10/11/2015   Procedure: Coronary Stent Intervention;  Surgeon: [name], MD;  Location: [facility] LAB;  Service: Cardiovascular;  Laterality: N/A;  . CARDIAC CATHETERIZATION N/A 10/11/2015   Procedure: Intravascular Pressure Wire/FFR Study;  Surgeon: [name], MD;  Location: [facility];  Service: Cardiovascular;  Laterality: N/A;  . CARDIAC CATHETERIZATION N/A 10/14/2015   Procedure: Coronary Stent Intervention;  Surgeon: [name], MD;  Location: [facility];  Service: Cardiovascular;  Laterality: N/A;  . CARDIOVERSION N/A 04/05/2015   Procedure: CARDIOVERSION;  Surgeon: [name], MD;  Location: [facility];  Service: Cardiovascular;  Laterality: N/A;  . CARDIOVERSION N/A 12/09/2015   Procedure: CARDIOVERSION;  Surgeon: [name], MD;  Location: [facility];  Service: Cardiovascular;  Laterality: N/A;  . CARDIOVERSION N/A 03/16/2016   Procedure: CARDIOVERSION;  Surgeon: [name], MD;  Location: [facility];  Service: Cardiovascular;  Laterality: N/A;  . CATARACT EXTRACTION Right 2019   Dr. Patrici Ranks  . CORONARY ANGIOPLASTY    . ELECTROPHYSIOLOGIC STUDY N/A 06/11/2016   Procedure: Atrial Fibrillation Ablation;  Surgeon: Will [name], MD;  Location: [facility];  Service: Cardiovascular;  Laterality: N/A;  . TOTAL HIP ARTHROPLASTY Bilateral 2001  . UVULECTOMY       reports that he has never smoked. He has never used smokeless tobacco. He reports that he does not drink alcohol and does not use drugs.  Allergies  Allergen Reactions  . Amiodarone Other (See Comments)    Lung toxicity  . Bee Venom Other (See Comments)    Leg went numb    Family History  Problem Relation Age of Onset  . Lung cancer Mother   . Lung cancer Father   . Colon cancer Sister     Prior to Admission medications   Medication Sig Start Date End  Date Taking? Authorizing Provider  allopurinol (ZYLOPRIM) 300 MG tablet Take      1 tablet       Daily        to Prevent Gout 04/09/20   [name], MD  amLODipine (NORVASC) 5 MG tablet Take      1 &1/2 tablets (7.5 mg)        Daily     For BP & Heart 04/09/20   [name], MD  atorvastatin (LIPITOR) 80 MG tablet TAKE 1 TABLET(80 MG) BY MOUTH DAILY AT 6 PM 04/09/20   [name], MD  B Complex Vitamins (B COMPLEX PO) Take 1 tablet by mouth daily.    [provider]  carvedilol (COREG) 12.5 MG tablet Take 2 tablet 2 x  /day for BP & Heart 10/26/19   [name], MD  cholecalciferol (VITAMIN D) 1000 UNITS tablet Take 2,000 Units by mouth daily.    [provider]  colchicine 0.6 MG tablet Take 1 tablet daily to prevent Gout Attacks 08/29/18   [name], MD  levothyroxine (SYNTHROID) 100 MCG tablet Take      1 tablet  Daily       on an empty stomach with only water for 30 minutes & no Antacid meds, Calcium or Magnesium for 4 hours & avoid Biotin 04/09/20   [name], MD  loratadine (CLARITIN) 10 MG tablet Take 10 mg by mouth daily.    [provider]  magnesium gluconate (MAGONATE) 500 MG tablet Take 1,000 mg by mouth daily.     [provider]  metFORMIN (GLUCOPHAGE-XR) 500 MG 24 hr tablet Take     2 tablets     2 x /day     with Meals       for Diabetes 04/09/20   [name], MD  olmesartan [facility]) 40 MG tablet Take 1 tablet Daily for BP 01/03/20   [name], MD  warfarin (COUMADIN) 5 MG tablet TAKE 1 OR 1 AND 1/2 TABLET BY MOUTH EVERY DAY OR AS DIRECTED BY COUMADIN CLINIC 02/22/20   Camnitz, Ocie Doyne, MD    Physical Exam:  Constitutional: Elderly male who appears to be in no acute distress at this time. Vitals:   05/27/20 0835 05/27/20 0900 05/27/20 0915 05/27/20 0930  BP: (!) 148/58 (!) 166/73 (!) 168/73 (!) 170/71  Pulse: 72 69 71 73  Resp: (!) 21 (!) 24 20 18   Temp:      TempSrc:      SpO2: 96% 96%  95% 93%   Eyes: PERRL, lids and conjunctivae normal ENMT: Mucous membranes are moist. Posterior pharynx clear of any exudate or lesions.  Neck: normal, supple, no masses, no thyromegaly Respiratory: clear to auscultation bilaterally, no wheezing, no crackles. Normal respiratory effort. No accessory muscle use.  Cardiovascular: Regular rate and rhythm, no murmurs / rubs / gallops.  2+ pitting lower extremity edema. 2+ pedal pulses. No carotid bruits.  Abdomen: protuberant abdomen TTP of LLQ.  Bowel sounds present. Musculoskeletal: no clubbing / cyanosis. No joint deformity upper and lower extremities. Good ROM, no contractures. Normal muscle tone.  Skin: no rashes, lesions, ulcers. No induration Neurologic: CN 2-12 grossly intact. Sensation intact, DTR normal. Strength 5/5 in all 4.  Psychiatric: Normal judgment and insight. Alert and oriented x 3. Normal mood.     Labs on Admission: I have personally reviewed following labs and imaging studies  CBC: Recent Labs  Lab 05/26/20 1558  WBC 10.5  HGB 14.0  HCT 41.8  MCV 98.1  PLT 99991111   Basic Metabolic Panel: Recent Labs  Lab 05/26/20 1558  NA 140  K 3.8  CL 101  CO2 24  GLUCOSE 131*  BUN 32*  CREATININE 1.36*  CALCIUM 9.3   GFR: CrCl cannot be calculated (Unknown ideal weight.). Liver Function Tests: Recent Labs  Lab 05/26/20 1558  AST 69*  ALT 36  ALKPHOS 91  BILITOT 1.5*  PROT 7.7  ALBUMIN 3.6   No results for input(s): LIPASE, AMYLASE in the last 168 hours. No results for input(s): AMMONIA in the last 168 hours. Coagulation Profile: Recent Labs  Lab 05/27/20 0829  INR 1.8*   Cardiac Enzymes: No results for input(s): CKTOTAL, CKMB, CKMBINDEX, TROPONINI in the last 168 hours. BNP (last 3 results) No results for input(s): PROBNP in the last 8760 hours. HbA1C: No results for input(s): HGBA1C in the last 72 hours. CBG: No results for input(s): GLUCAP in the last 168 hours. Lipid Profile: No results for  input(s): CHOL, HDL, LDLCALC, TRIG, CHOLHDL, LDLDIRECT in the last 72 hours. Thyroid Function Tests: No results for input(s): TSH, T4TOTAL, FREET4, T3FREE, THYROIDAB  in the last 72 hours. Anemia Panel: No results for input(s): VITAMINB12, FOLATE, FERRITIN, TIBC, IRON, RETICCTPCT in the last 72 hours. Urine analysis:    Component Value Date/Time   COLORURINE DARK YELLOW 11/02/2019 1446   APPEARANCEUR CLOUDY (A) 11/02/2019 1446   LABSPEC 1.022 11/02/2019 1446   PHURINE 6.0 11/02/2019 1446   GLUCOSEU 1+ (A) 11/02/2019 1446   HGBUR NEGATIVE 11/02/2019 1446   BILIRUBINUR NEGATIVE 05/21/2016 1555   KETONESUR TRACE (A) 11/02/2019 1446   PROTEINUR TRACE (A) 11/02/2019 1446   UROBILINOGEN 0.2 05/01/2014 1444   NITRITE NEGATIVE 11/02/2019 1446   LEUKOCYTESUR NEGATIVE 11/02/2019 1446   Sepsis Labs: No results found for this or any previous visit (from the past 240 hour(s)).   Radiological Exams on Admission: No results found.  EKG: Independently reviewed. Sinus rhythm at 72 bpm  Assessment/Plan Suspected upper GI bleed: Acute.  Patient reports several days of dark tarry stools and left lower quadrant pain.  Vital signs and hemoglobin currently stable.  Stool was noted to be guaiac positive.  Based off patient's history and elevated BUN suspect a likely upper GI bleed.  His last colonoscopy was in 2019 by Dr. Leone Payor revealed a diminutive sessile polyp in the transverse colon which was removed and internal hemorrhoids, but has had EGD before. -Admit to medical telemetry -Serial monitoring of H&H -Check acute abdominal series but may warrant CT scan of the abdomen and pelvis -Transfuse blood products as needed -Protonix bolus and drip per protocol -[facility] consulted,  will follow-up for further recommendation  Acute kidney injury: Patient presents with creatinine elevated at 1.36 with BUN 32.  Baseline creatinine previously noted to be 0.89 less than 1 month ago.  Suspect prerenal cause  of symptoms. -Check FeNa -Check urinalysis -Normal saline IV fluids at 75 mL/h -Hold nephrotoxic agent -Recheck kidney function in a.m.  Lower extremity edema: Acute. Patient with at least 2+ pitting edema in the bilateral lower extremities.  Denies any complaints of shortness of breath.  BNP 86.8. -Elevate lower extremity  Paroxysmal atrial fibrillation with subtherapeutic INR: Patient reports prior history of ablation and cardioversion.  INR subtherapeutic at 1.8 and last reports taking Coumadin 2 days ago.  Appears to be in sinus rhythm currently. -Hold Coumadin due to bleeding  Essential hypertension: Home blood pressure medications include amlodipine 7.5 mg daily, Coreg 25 mg twice daily, -Continue Coreg and amlodipine -Hydralazine  Diabetes mellitus type 2, uncontrolled: Last hemoglobin A1c noted to be 7.3 on 05/07/2020. Home medications include Metformin 100 mg twice daily. -Hypoglycemic protocol -Hold Metformin -CBGs before every meal with moderate SSI  Hypothyroidism: Last TSH was 3.6 on 05/07/2020. -Continue levothyroxine  Elevated AST: Acute.  AST 69 patient, but previously within normal limits.  Suspect likely reactive in nature. -Consider rechecking later point time  DVT prophylaxis: SCD Code Status: Full Family Communication: Patient's brother updated over the phone Disposition Plan: Likely discharge home in 1 to 2 days Consults called: [facility] Admission status: Observation  Clydie Braun MD [facility]   If 7PM-7AM, please contact night-coverage   05/27/2020, 9:53 AM

## 2020-05-27 NOTE — ED Notes (Signed)
Lunch Tray Ordered @ 1045 

## 2020-05-27 NOTE — H&P (View-Only) (Signed)
 Referring Provider: Dr. Rondell Smith  Primary Care Physician:  McKeown, William, MD Primary Gastroenterologist:  Dr. Gessner  Reason for Consultation:  Lower bdominal pain, melena  HPI: Christopher Wade is a 69 y.o. male with a past medical history of hypertension, coronary artery disease s/p 2 coronary stents 2017, atrial fibrillation with past ablation x 2 on Warfarin, OSA, carotid artery disease, hypothyroidism, diabetes mellitus type 2, CKD stage 2 and colon polyps. He presented to [facility] ED by [facility] on 05/26/2020 for further for lower abdominal pain and reports of melenic stool.  He developed constipation on Wednesday 05/22/2020.  On Thursday 12/30 he felt constipated with the development of lower abdominal pain.  On Friday, 12/31 he took six scoops of MiraLAX in 12 ounces of water.  Later that evening, he stated everything broke loose.  He passed a large amount of liquid messy black stool, he was initially in the bathroom for 1-1/2 hours.  He continued to pass black loose stools throughout the night.  Saturday 1/1 he continued to have lower abdominal pain and passed two or three loose black stools.  His appetite was decreased.  No fever.  Sunday 1/2 his lower abdominal pain worsened and he passed a small amount of loose black stool.  He reports passing a squirt of loose black stool earlier this morning.  No bright red rectal bleeding.  Prior to his recent onset of constipation, he reported passing normal formed brown bowel movement on most days.  If he eats greasy fatty foods he develops diarrhea.  He continues to have lower abdominal pain, LLQ > RLQ.  He is on Warfarin due to having atrial fibrillation.  His last dose Warfarin was taken on Saturday 05/25/2020 at 6 PM.  He denies taking any aspirin or other NSAIDs.  No alcohol use.  He denies any prior history of GI bleed. Three brothers and one sister with history of colon cancer.  He underwent a colonoscopy by Dr. Gessner in 2019  which identified a diminutive sessile serrated polyp in transverse colon and internal hemorrhoids.  He was advised to repeat a colonoscopy in 5 years.  No nausea or vomiting.  No dysphagia, heartburn or upper abdominal pain.  No history of ulcers/PUD.  Labs in the ED showed a sodium 140.  Potassium 3.8.  Glucose 131.  BUN 32.  Creatinine 1.36 (baseline Cr 0.89 on 05/07/2020).  Alk phos 91.  AST 69.  ALT 36.  Total bili 1.5.  SARS coronavirus two pending.  Troponin 10.  WBC 10.5.  Hemoglobin 14.0 which is his baseline level.  Hematocrit 41.8.  Repeat hemoglobin level 13.3 at 11:18 AM.  MCV 98.1.  Platelet 277.  INR 1.8.  Abdominal  x-rays have been ordered but not yet completed.  A GI consult was requested for further evaluation.  Colonoscopy 10/05/2017: - The perianal and digital rectal examinations were normal. Pertinent negatives include normal prostate (size, shape, and consistency). - A diminutive polyp was found in the transverse colon. The polyp was sessile. The polyp was removed with a cold snare. Resection and retrieval were complete. Verification of patient identification for the specimen was done. Estimated blood loss was minimal. - Internal hemorrhoids were found during retroflexion. - The exam was otherwise without abnormality on direct and retroflexion views. -5 year recall colonoscopy   Colonoscopy 04/10/2008: Three polyps removed from the left colon  Echo 04/01/2015: LV EF: 60% -  65%  Past Medical History:  Diagnosis Date  .   Arthritis    "joints get stiff" (06/11/2016)  . Bee sting allergy   . Bee sting allergy   . CAD (coronary artery disease), native coronary artery 02/12/2016   cath showing 50% LM, 40% distal LAD, 70% ost to prox LCs, 40% mid LAD and 95% mid to dist RCA s/p PCI with DES x 2 and stage PCI of the LCx/OM.  . Carotid artery stenosis    1-39% bilateral by dopplers 08/2017  . History of gout   . Hx of adenomatous colonic polyps 04/10/2008  .  Hyperlipidemia   . Hypertension   . Hypothyroidism   . OSA (obstructive sleep apnea)    "tried CPAP; couldn't do it" (06/11/2016)  . Persistent atrial fibrillation (HCC)    s/p DCCV 03/2015 with reversion back to atrial fibrillation, loaded with Amio and s/p DCCV to NSR 11/2015.  He is now s/p afib albation.  He had amio lung toxicity and amio was stopped.  . Thyroid disease   . Type II diabetes mellitus (HCC)   . Vitamin D deficiency     Past Surgical History:  Procedure Laterality Date  . CARDIAC CATHETERIZATION N/A 10/11/2015   Procedure: Left Heart Cath and Coronary Angiography;  Surgeon: Peter C Nishan, MD;  Location: [facility];  Service: Cardiovascular;  Laterality: N/A;  . CARDIAC CATHETERIZATION N/A 10/11/2015   Procedure: Coronary Stent Intervention;  Surgeon: Peter M Jordan, MD;  Location: [facility];  Service: Cardiovascular;  Laterality: N/A;  . CARDIAC CATHETERIZATION N/A 10/11/2015   Procedure: Intravascular Pressure Wire/FFR Study;  Surgeon: Peter M Jordan, MD;  Location: [facility];  Service: Cardiovascular;  Laterality: N/A;  . CARDIAC CATHETERIZATION N/A 10/14/2015   Procedure: Coronary Stent Intervention;  Surgeon: Jayadeep S Varanasi, MD;  Location: [facility];  Service: Cardiovascular;  Laterality: N/A;  . CARDIOVERSION N/A 04/05/2015   Procedure: CARDIOVERSION;  Surgeon: Traci R Turner, MD;  Location: [facility];  Service: Cardiovascular;  Laterality: N/A;  . CARDIOVERSION N/A 12/09/2015   Procedure: CARDIOVERSION;  Surgeon: Traci R Turner, MD;  Location: [facility];  Service: Cardiovascular;  Laterality: N/A;  . CARDIOVERSION N/A 03/16/2016   Procedure: CARDIOVERSION;  Surgeon: Tiffany , MD;  Location: [facility];  Service: Cardiovascular;  Laterality: N/A;  . CATARACT EXTRACTION Right 2019   Dr. Groats  . CORONARY ANGIOPLASTY    . ELECTROPHYSIOLOGIC STUDY N/A 06/11/2016   Procedure: Atrial Fibrillation Ablation;  Surgeon:  Will Martin Camnitz, MD;  Location: [facility];  Service: Cardiovascular;  Laterality: N/A;  . TOTAL HIP ARTHROPLASTY Bilateral 2001  . UVULECTOMY      Prior to Admission medications   Medication Sig Start Date End Date Taking? Authorizing Provider  acetaminophen (TYLENOL) 325 MG tablet Take 650 mg by mouth every 6 (six) hours as needed for mild pain.   Yes [provider]  allopurinol (ZYLOPRIM) 300 MG tablet Take      1 tablet       Daily        to Prevent Gout 04/09/20  Yes McKeown, William, MD  amLODipine (NORVASC) 5 MG tablet Take      1 &1/2 tablets (7.5 mg)        Daily     For BP & Heart Patient taking differently: Take 7.5 mg by mouth daily. 04/09/20  Yes McKeown, William, MD  Ascorbic Acid (VITAMIN C WITH ROSE HIPS) 500 MG tablet Take 500 mg by mouth daily.   Yes [provider]  atorvastatin (LIPITOR) 80 MG tablet TAKE 1 TABLET(80 MG) BY MOUTH DAILY AT 6 PM Patient taking differently: Take 40 mg by mouth daily. 04/09/20  Yes Turner, Traci R, MD  B Complex Vitamins (B COMPLEX PO) Take 1 tablet by mouth daily.   Yes [provider]  carvedilol (COREG) 12.5 MG tablet Take 2 tablet 2 x  /day for BP & Heart Patient taking differently: Take 25 mg by mouth 2 (two) times daily with a meal. 10/26/19  Yes McKeown, William, MD  cholecalciferol (VITAMIN D) 1000 UNITS tablet Take 2,000 Units by mouth daily.   Yes [provider]  colchicine 0.6 MG tablet Take 1 tablet daily to prevent Gout Attacks Patient taking differently: Take 0.6-1.2 mg by mouth daily as needed (flare up). 08/29/18  Yes McKeown, William, MD  levothyroxine (SYNTHROID) 100 MCG tablet Take      1 tablet       Daily       on an empty stomach with only water for 30 minutes & no Antacid meds, Calcium or Magnesium for 4 hours & avoid Biotin Patient taking differently: Take 100 mcg by mouth daily before breakfast. 04/09/20  Yes McKeown, William, MD  loratadine (CLARITIN) 10 MG tablet Take 10 mg  by mouth daily.   Yes [provider]  magnesium gluconate (MAGONATE) 500 MG tablet Take 1,000 mg by mouth daily.    Yes [provider]  metFORMIN (GLUCOPHAGE-XR) 500 MG 24 hr tablet Take     2 tablets     2 x /day     with Meals       for Diabetes Patient taking differently: Take 1,000 mg by mouth 2 (two) times daily. 04/09/20  Yes McKeown, William, MD  olmesartan (BENICAR) 40 MG tablet Take 1 tablet Daily for BP Patient taking differently: Take 40 mg by mouth daily. 01/03/20  Yes McKeown, William, MD  Omega-3 Fatty Acids (FISH OIL) 1200 MG CAPS Take 1,200 mg by mouth daily.   Yes [provider]  warfarin (COUMADIN) 5 MG tablet TAKE 1 OR 1 AND 1/2 TABLET BY MOUTH EVERY DAY OR AS DIRECTED BY COUMADIN CLINIC Patient taking differently: Take 5-7.5 mg by mouth See admin instructions. Takes 1 tablet (5 mg totally) by mouth on Thurs and Suns; take 1.5 tablets (7.5 mg totally) by mouth all other days 02/22/20  Yes Camnitz, Will Martin, MD  zinc gluconate 50 MG tablet Take 50 mg by mouth daily.   Yes [provider]    Current Facility-Administered Medications  Medication Dose Route Frequency Provider Last Rate Last Admin  . 0.9 %  sodium chloride infusion   Intravenous Continuous Smith, Rondell A, MD 75 mL/hr at 05/27/20 1047 New Bag at 05/27/20 1047  . acetaminophen (TYLENOL) tablet 650 mg  650 mg Oral Q6H PRN Smith, Rondell A, MD       Or  . acetaminophen (TYLENOL) suppository 650 mg  650 mg Rectal Q6H PRN Smith, Rondell A, MD      . albuterol (PROVENTIL) (2.5 MG/3ML) 0.083% nebulizer solution 2.5 mg  2.5 mg Nebulization Q6H PRN Smith, Rondell A, MD      . allopurinol (ZYLOPRIM) tablet 300 mg  300 mg Oral Daily Smith, Rondell A, MD   300 mg at 05/27/20 1136  . amLODipine (NORVASC) tablet 7.5 mg  7.5 mg Oral Daily Smith, Rondell A, MD   7.5 mg at 05/27/20 1136  . atorvastatin (LIPITOR) tablet 40 mg  40 mg   Oral Daily Smith, Rondell A, MD   40 mg at 05/27/20 1136  .  carvedilol (COREG) tablet 25 mg  25 mg Oral BID WC Smith, Rondell A, MD      . hydrALAZINE (APRESOLINE) injection 10 mg  10 mg Intravenous Q4H PRN Smith, Rondell A, MD      . insulin aspart (novoLOG) injection 0-15 Units  0-15 Units Subcutaneous TID WC Smith, Rondell A, MD      . insulin aspart (novoLOG) injection 0-5 Units  0-5 Units Subcutaneous QHS Smith, Rondell A, MD      . [START ON 05/28/2020] levothyroxine (SYNTHROID) tablet 100 mcg  100 mcg Oral QAC breakfast Smith, Rondell A, MD      . ondansetron (ZOFRAN) tablet 4 mg  4 mg Oral Q6H PRN Smith, Rondell A, MD       Or  . ondansetron (ZOFRAN) injection 4 mg  4 mg Intravenous Q6H PRN Smith, Rondell A, MD      . pantoprazole (PROTONIX) 80 mg in sodium chloride 0.9 % 100 mL (0.8 mg/mL) infusion  8 mg/hr Intravenous Continuous Smith, Rondell A, MD 10 mL/hr at 05/27/20 1048 8 mg/hr at 05/27/20 1048  . [START ON 05/30/2020] pantoprazole (PROTONIX) injection 40 mg  40 mg Intravenous Q12H Smith, Rondell A, MD      . sodium chloride flush (NS) 0.9 % injection 3 mL  3 mL Intravenous Q12H Smith, Rondell A, MD   3 mL at 05/27/20 1026   Current Outpatient Medications  Medication Sig Dispense Refill  . acetaminophen (TYLENOL) 325 MG tablet Take 650 mg by mouth every 6 (six) hours as needed for mild pain.    . allopurinol (ZYLOPRIM) 300 MG tablet Take      1 tablet       Daily        to Prevent Gout 90 tablet 0  . amLODipine (NORVASC) 5 MG tablet Take      1 &1/2 tablets (7.5 mg)        Daily     For BP & Heart (Patient taking differently: Take 7.5 mg by mouth daily.) 135 tablet 0  . Ascorbic Acid (VITAMIN C WITH ROSE HIPS) 500 MG tablet Take 500 mg by mouth daily.    . atorvastatin (LIPITOR) 80 MG tablet TAKE 1 TABLET(80 MG) BY MOUTH DAILY AT 6 PM (Patient taking differently: Take 40 mg by mouth daily.) 90 tablet 3  . B Complex Vitamins (B COMPLEX PO) Take 1 tablet by mouth daily.    . carvedilol (COREG) 12.5 MG tablet Take 2 tablet 2 x  /day for BP & Heart  (Patient taking differently: Take 25 mg by mouth 2 (two) times daily with a meal.) 360 tablet 0  . cholecalciferol (VITAMIN D) 1000 UNITS tablet Take 2,000 Units by mouth daily.    . colchicine 0.6 MG tablet Take 1 tablet daily to prevent Gout Attacks (Patient taking differently: Take 0.6-1.2 mg by mouth daily as needed (flare up).) 90 tablet 3  . levothyroxine (SYNTHROID) 100 MCG tablet Take      1 tablet       Daily       on an empty stomach with only water for 30 minutes & no Antacid meds, Calcium or Magnesium for 4 hours & avoid Biotin (Patient taking differently: Take 100 mcg by mouth daily before breakfast.) 90 tablet 0  . loratadine (CLARITIN) 10 MG tablet Take 10 mg by mouth daily.    . magnesium gluconate (MAGONATE)   500 MG tablet Take 1,000 mg by mouth daily.     . metFORMIN (GLUCOPHAGE-XR) 500 MG 24 hr tablet Take     2 tablets     2 x /day     with Meals       for Diabetes (Patient taking differently: Take 1,000 mg by mouth 2 (two) times daily.) 360 tablet 0  . olmesartan (BENICAR) 40 MG tablet Take 1 tablet Daily for BP (Patient taking differently: Take 40 mg by mouth daily.) 90 tablet 0  . Omega-3 Fatty Acids (FISH OIL) 1200 MG CAPS Take 1,200 mg by mouth daily.    . warfarin (COUMADIN) 5 MG tablet TAKE 1 OR 1 AND 1/2 TABLET BY MOUTH EVERY DAY OR AS DIRECTED BY COUMADIN CLINIC (Patient taking differently: Take 5-7.5 mg by mouth See admin instructions. Takes 1 tablet (5 mg totally) by mouth on Thurs and Suns; take 1.5 tablets (7.5 mg totally) by mouth all other days) 45 tablet 3  . zinc gluconate 50 MG tablet Take 50 mg by mouth daily.      Allergies as of 05/26/2020 - Review Complete 05/07/2020  Allergen Reaction Noted  . Amiodarone Other (See Comments) 07/23/2016  . Bee venom Other (See Comments) 04/05/2015    Family History  Problem Relation Age of Onset  . Lung cancer Mother   . Lung cancer Father   . Colon cancer Sister     Social History   Socioeconomic History  .  Marital status: Single    Spouse name: Not on file  . Number of children: Not on file  . Years of education: Not on file  . Highest education level: Not on file  Occupational History  . Not on file  Tobacco Use  . Smoking status: Never Smoker  . Smokeless tobacco: Never Used  Vaping Use  . Vaping Use: Never used  Substance and Sexual Activity  . Alcohol use: No  . Drug use: No  . Sexual activity: Not Currently  Other Topics Concern  . 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  Currently struggling to find much work.09/28/2017   Denies alcohol tobacco or drug use   Social Determinants of Health   Financial Resource Strain: Not on file  Food Insecurity: Not on file  Transportation Needs: Not on file  Physical Activity: Not on file  Stress: Not on file  Social Connections: Not on file  Intimate Partner Violence: Not on file    Review of Systems: Gen: Denies fever, sweats or chills. No weight loss.  CV: Denies chest pain, palpitations or edema. Resp: Denies cough, shortness of breath of hemoptysis.  GI: See HPI. GU : Denies urinary burning, blood in urine, increased urinary frequency or incontinence. MS: Denies joint pain, muscles aches or weakness. Derm: Denies rash, itchiness, skin lesions or unhealing ulcers. Psych: Denies depression, anxiety or memory loss. Heme: Denies easy bruising, bleeding. Neuro:  Denies headaches, dizziness or paresthesias. Endo:  + DM II.   Physical Exam: Vital signs in last 24 hours: Temp:  [98.1 F (36.7 C)-99.1 F (37.3 C)] 99.1 F (37.3 C) (01/03 0228) Pulse Rate:  [63-76] 71 (01/03 1145) Resp:  [14-33] 24 (01/03 1145) BP: (134-175)/(58-122) 170/72 (01/03 1145) SpO2:  [91 %-98 %] 92 % (01/03 1145) FiO2 (%):  [21 %] 21 % (01/03 1002)   General: Alert 69-year-old male in no acute distress. Head:  Normocephalic and atraumatic. Eyes:    No scleral icterus. Conjunctiva  pink. Ears:  Normal auditory acuity. Nose:  No deformity, discharge or lesions. Mouth:  Dentition intact. No ulcers or lesions.  Neck:  Supple. No lymphadenopathy or thyromegaly.  Lungs: Breath sounds clear throughout. Heart: Regular rate and rhythm, no murmurs. Abdomen: Nondistended but mildly firm on palpation, no mass or organomegaly.  Mild left lower quadrant tenderness without rebound or guarding.  Hypoactive bowel sounds to all four quadrants. Rectal: No external hemorrhoids.  Poor anal sphincter tone.  Watery loose dark brown stool grossly heme positive. Musculoskeletal:  Symmetrical without gross deformities.  Pulses:  Normal pulses noted. Extremities:  Bilateral LEs with 2 - 3+ pitting edema.  Neurologic:  Alert and  oriented x4. No focal deficits.  Skin:  Intact without significant lesions or rashes. Psych:  Alert and cooperative. Normal mood and affect.  Intake/Output from previous day: No intake/output data recorded. Intake/Output this shift: No intake/output data recorded.  Lab Results: Recent Labs    05/26/20 1558  WBC 10.5  HGB 14.0  HCT 41.8  PLT 277   BMET Recent Labs    05/26/20 1558  NA 140  K 3.8  CL 101  CO2 24  GLUCOSE 131*  BUN 32*  CREATININE 1.36*  CALCIUM 9.3   LFT Recent Labs    05/26/20 1558  PROT 7.7  ALBUMIN 3.6  AST 69*  ALT 36  ALKPHOS 91  BILITOT 1.5*   PT/INR Recent Labs    05/27/20 0829  LABPROT 20.6*  INR 1.8*   Hepatitis Panel No results for input(s): HEPBSAG, HCVAB, HEPAIGM, HEPBIGM in the last 72 hours.    Studies/Results: No results found.  IMPRESSION/PLAN:  1.  69-year-old male admitted to the hospital with lower abdominal pain, constipation and melenic stools which started after he took 6 doses of Miralax.  Hemoglobin 14 -> 13.3.  No frank melena or bright red rectal bleeding since arriving to the ED.  He passed a small amount of dark brown watery stool earlier today.  FOBT positive.  On Pantoprazole  infusion. Last dose of Warfarin was on 05/25/2020. -Hold Warfarin -Continue IV PPI infusion for now -Continue to monitor H&H closely -Transfuse for hemoglobin less than eight -Abdominal x-rays ordered, not yet completed -Recommend abdominal/pelvic CT with oral contrast (no IV contrast with elevated creatinine level) -Defer decision regarding endoscopic evaluation until CTAP results received -Await further recommendations per Dr. Pyrtle  2. History of atrial fibrillation on warfarin -Hold Warfarin as noted above.  3. History of CAD status post coronary stent x 3 in 2015  4. Mildly elevated ALT and total bilirubin levels.  SARS coronavirus 2 negative. -Acute hepatitis panel -Repeat hepatic panel in a.m. -Add direct bilirubin level to prior lab draw  5.  Lower extremity edema -Consider repeat echo during this hospital admission  6. History of Sessile serrated colon polyp. Family history of colon cancer. -Next colonoscopy due 09/2022    Betty Brooks M Kennedy-Smith  05/27/2020, 11:47 AM      

## 2020-05-27 NOTE — Consult Note (Addendum)
Referring Provider: Dr. Fuller Plan  Primary Care Physician:  [name], MD Primary Gastroenterologist:  Dr. Carlean Purl  Reason for Consultation:  Lower bdominal pain, melena  HPI: Christopher Wade is a 70 y.o. male with a past medical history of hypertension, coronary artery disease s/p 2 coronary stents 2017, atrial fibrillation with past ablation x 2 on Warfarin, OSA, carotid artery disease, hypothyroidism, diabetes mellitus type 2, CKD stage 2 and colon polyps. He presented to [facility] ED by [facility] on 05/26/2020 for further for lower abdominal pain and reports of melenic stool.  He developed constipation on Wednesday 05/22/2020.  On Thursday 12/30 he felt constipated with the development of lower abdominal pain.  On Friday, 12/31 he took six scoops of MiraLAX in 12 ounces of water.  Later that evening, he stated everything broke loose.  He passed a large amount of liquid messy black stool, he was initially in the bathroom for 1-1/2 hours.  He continued to pass black loose stools throughout the night.  Saturday 1/1 he continued to have lower abdominal pain and passed two or three loose black stools.  His appetite was decreased.  No fever.  Sunday 1/2 his lower abdominal pain worsened and he passed a small amount of loose black stool.  He reports passing a squirt of loose black stool earlier this morning.  No bright red rectal bleeding.  Prior to his recent onset of constipation, he reported passing normal formed brown bowel movement on most days.  If he eats greasy fatty foods he develops diarrhea.  He continues to have lower abdominal pain, LLQ > RLQ.  He is on Warfarin due to having atrial fibrillation.  His last dose Warfarin was taken on Saturday 05/25/2020 at 6 PM.  He denies taking any aspirin or other NSAIDs.  No alcohol use.  He denies any prior history of GI bleed. Three brothers and one sister with history of colon cancer.  He underwent a colonoscopy by Dr. Carlean Purl in 2019  which identified a diminutive sessile serrated polyp in transverse colon and internal hemorrhoids.  He was advised to repeat a colonoscopy in 5 years.  No nausea or vomiting.  No dysphagia, heartburn or upper abdominal pain.  No history of ulcers/PUD.  Labs in the ED showed a sodium 140.  Potassium 3.8.  Glucose 131.  BUN 32.  Creatinine 1.36 (baseline Cr 0.89 on 05/07/2020).  Alk phos 91.  AST 69.  ALT 36.  Total bili 1.5.  SARS coronavirus two pending.  Troponin 10.  WBC 10.5.  Hemoglobin 14.0 which is his baseline level.  Hematocrit 41.8.  Repeat hemoglobin level 13.3 at 11:18 AM.  MCV 98.1.  Platelet 277.  INR 1.8.  Abdominal  x-rays have been ordered but not yet completed.  A GI consult was requested for further evaluation.  Colonoscopy 10/05/2017: - The perianal and digital rectal examinations were normal. Pertinent negatives include normal prostate (size, shape, and consistency). - A diminutive polyp was found in the transverse colon. The polyp was sessile. The polyp was removed with a cold snare. Resection and retrieval were complete. Verification of patient identification for the specimen was done. Estimated blood loss was minimal. - Internal hemorrhoids were found during retroflexion. - The exam was otherwise without abnormality on direct and retroflexion views. -5 year recall colonoscopy   Colonoscopy 04/10/2008: Three polyps removed from the left colon  Echo 04/01/2015: LV EF: 60% -  65%  Past Medical History:  Diagnosis Date  .  Arthritis    "joints get stiff" (06/11/2016)  . Bee sting allergy   . Bee sting allergy   . CAD (coronary artery disease), native coronary artery 02/12/2016   cath showing 50% LM, 40% distal LAD, 70% ost to prox LCs, 40% mid LAD and 95% mid to dist RCA s/p PCI with DES x 2 and stage PCI of the LCx/OM.  [name] Carotid artery stenosis    1-39% bilateral by dopplers 08/2017  . History of gout   . Hx of adenomatous colonic polyps 04/10/2008  .  Hyperlipidemia   . Hypertension   . Hypothyroidism   . OSA (obstructive sleep apnea)    "tried CPAP; couldn't do it" (06/11/2016)  . Persistent atrial fibrillation ([place])    s/p DCCV 03/2015 with reversion back to atrial fibrillation, loaded with Amio and s/p DCCV to NSR 11/2015.  He is now s/p afib albation.  He had amio lung toxicity and amio was stopped.  . Thyroid disease   . Type II diabetes mellitus ([place])   . Vitamin D deficiency     Past Surgical History:  Procedure Laterality Date  . CARDIAC CATHETERIZATION N/A 10/11/2015   Procedure: Left Heart Cath and Coronary Angiography;  Surgeon: [name], MD;  Location: [place] [facility];  Service: Cardiovascular;  Laterality: N/A;  . CARDIAC CATHETERIZATION N/A 10/11/2015   Procedure: Coronary Stent Intervention;  Surgeon: [name], MD;  Location: [place] [facility];  Service: Cardiovascular;  Laterality: N/A;  . CARDIAC CATHETERIZATION N/A 10/11/2015   Procedure: Intravascular Pressure Wire/FFR Study;  Surgeon: [name], MD;  Location: [place] [facility];  Service: Cardiovascular;  Laterality: N/A;  . CARDIAC CATHETERIZATION N/A 10/14/2015   Procedure: Coronary Stent Intervention;  Surgeon: [name], MD;  Location: [place] [facility];  Service: Cardiovascular;  Laterality: N/A;  . CARDIOVERSION N/A 04/05/2015   Procedure: CARDIOVERSION;  Surgeon: [name], MD;  Location: [place] [facility];  Service: Cardiovascular;  Laterality: N/A;  . CARDIOVERSION N/A 12/09/2015   Procedure: CARDIOVERSION;  Surgeon: [name], MD;  Location: [place] [facility];  Service: Cardiovascular;  Laterality: N/A;  . CARDIOVERSION N/A 03/16/2016   Procedure: CARDIOVERSION;  Surgeon: [name], MD;  Location: [place];  Service: Cardiovascular;  Laterality: N/A;  . CATARACT EXTRACTION Right 2019   Dr. Patrici Ranks  . CORONARY ANGIOPLASTY    . ELECTROPHYSIOLOGIC STUDY N/A 06/11/2016   Procedure: Atrial Fibrillation Ablation;  Surgeon:  Will [name], MD;  Location: [place] [facility];  Service: Cardiovascular;  Laterality: N/A;  . TOTAL HIP ARTHROPLASTY Bilateral 2001  . UVULECTOMY      Prior to Admission medications   Medication Sig Start Date End Date Taking? Authorizing Provider  acetaminophen (TYLENOL) 325 MG tablet Take 650 mg by mouth every 6 (six) hours as needed for mild pain.   Yes [provider]  allopurinol (ZYLOPRIM) 300 MG tablet Take      1 tablet       Daily        to Prevent Gout 04/09/20  Yes [name], MD  amLODipine (NORVASC) 5 MG tablet Take      1 &1/2 tablets (7.5 mg)        Daily     For BP & Heart Patient taking differently: Take 7.5 mg by mouth daily. 04/09/20  Yes [name], MD  Ascorbic Acid (VITAMIN C WITH ROSE HIPS) 500 MG tablet Take 500 mg by mouth daily.   Yes [provider]  atorvastatin (LIPITOR) 80 MG tablet TAKE 1 TABLET(80 MG) BY MOUTH DAILY AT 6 PM Patient taking differently: Take 40 mg by mouth daily. 04/09/20  Yes Turner, Eber Hong, MD  B Complex Vitamins (B COMPLEX PO) Take 1 tablet by mouth daily.   Yes [provider]  carvedilol (COREG) 12.5 MG tablet Take 2 tablet 2 x  /day for BP & Heart Patient taking differently: Take 25 mg by mouth 2 (two) times daily with a meal. 10/26/19  Yes [name], MD  cholecalciferol (VITAMIN D) 1000 UNITS tablet Take 2,000 Units by mouth daily.   Yes [provider]  colchicine 0.6 MG tablet Take 1 tablet daily to prevent Gout Attacks Patient taking differently: Take 0.6-1.2 mg by mouth daily as needed (flare up). 08/29/18  Yes [name], MD  levothyroxine (SYNTHROID) 100 MCG tablet Take      1 tablet       Daily       on an empty stomach with only water for 30 minutes & no Antacid meds, Calcium or Magnesium for 4 hours & avoid Biotin Patient taking differently: Take 100 mcg by mouth daily before breakfast. 04/09/20  Yes [name], MD  loratadine (CLARITIN) 10 MG tablet Take 10 mg  by mouth daily.   Yes [provider]  magnesium gluconate (MAGONATE) 500 MG tablet Take 1,000 mg by mouth daily.    Yes [provider]  metFORMIN (GLUCOPHAGE-XR) 500 MG 24 hr tablet Take     2 tablets     2 x /day     with Meals       for Diabetes Patient taking differently: Take 1,000 mg by mouth 2 (two) times daily. 04/09/20  Yes [name], MD  olmesartan (BENICAR) 40 MG tablet Take 1 tablet Daily for BP Patient taking differently: Take 40 mg by mouth daily. 01/03/20  Yes [name], MD  Omega-3 Fatty Acids (FISH OIL) 1200 MG CAPS Take 1,200 mg by mouth daily.   Yes [provider]  warfarin (COUMADIN) 5 MG tablet TAKE 1 OR 1 AND 1/2 TABLET BY MOUTH EVERY DAY OR AS DIRECTED BY COUMADIN CLINIC Patient taking differently: Take 5-7.5 mg by mouth See admin instructions. Takes 1 tablet (5 mg totally) by mouth on Thurs and Suns; take 1.5 tablets (7.5 mg totally) by mouth all other days 02/22/20  Yes Camnitz, Will Hassell Done, MD  zinc gluconate 50 MG tablet Take 50 mg by mouth daily.   Yes [provider]    Current Facility-Administered Medications  Medication Dose Route Frequency Provider Last Rate Last Admin  . 0.9 %  sodium chloride infusion   Intravenous Continuous Fuller Plan A, MD 75 mL/hr at 05/27/20 1047 New Bag at 05/27/20 1047  . acetaminophen (TYLENOL) tablet 650 mg  650 mg Oral Q6H PRN Norval Morton, MD       Or  . acetaminophen (TYLENOL) suppository 650 mg  650 mg Rectal Q6H PRN Smith, Rondell A, MD      . albuterol (PROVENTIL) (2.5 MG/3ML) 0.083% nebulizer solution 2.5 mg  2.5 mg Nebulization Q6H PRN Smith, Rondell A, MD      . allopurinol (ZYLOPRIM) tablet 300 mg  300 mg Oral Daily Tamala Julian, Rondell A, MD   300 mg at 05/27/20 1136  . amLODipine (NORVASC) tablet 7.5 mg  7.5 mg Oral Daily Smith, Rondell A, MD   7.5 mg at 05/27/20 1136  . atorvastatin (LIPITOR) tablet 40 mg  40 mg  Oral Daily Fuller Plan A, MD   40 mg at 05/27/20 1136  .  carvedilol (COREG) tablet 25 mg  25 mg Oral BID WC Smith, Rondell A, MD      . hydrALAZINE (APRESOLINE) injection 10 mg  10 mg Intravenous Q4H PRN Smith, Rondell A, MD      . insulin aspart (novoLOG) injection 0-15 Units  0-15 Units Subcutaneous TID WC Smith, Rondell A, MD      . insulin aspart (novoLOG) injection 0-5 Units  0-5 Units Subcutaneous QHS Smith, Rondell A, MD      . Derrill Memo ON 05/28/2020] levothyroxine (SYNTHROID) tablet 100 mcg  100 mcg Oral QAC breakfast Smith, Rondell A, MD      . ondansetron (ZOFRAN) tablet 4 mg  4 mg Oral Q6H PRN Fuller Plan A, MD       Or  . ondansetron (ZOFRAN) injection 4 mg  4 mg Intravenous Q6H PRN Smith, Rondell A, MD      . pantoprazole (PROTONIX) 80 mg in sodium chloride 0.9 % 100 mL (0.8 mg/mL) infusion  8 mg/hr Intravenous Continuous Smith, Rondell A, MD 10 mL/hr at 05/27/20 1048 8 mg/hr at 05/27/20 1048  . [START ON 05/30/2020] pantoprazole (PROTONIX) injection 40 mg  40 mg Intravenous Q12H Smith, Rondell A, MD      . sodium chloride flush (NS) 0.9 % injection 3 mL  3 mL Intravenous Q12H Smith, Rondell A, MD   3 mL at 05/27/20 1026   Current Outpatient Medications  Medication Sig Dispense Refill  . acetaminophen (TYLENOL) 325 MG tablet Take 650 mg by mouth every 6 (six) hours as needed for mild pain.    [name] allopurinol (ZYLOPRIM) 300 MG tablet Take      1 tablet       Daily        to Prevent Gout 90 tablet 0  . amLODipine (NORVASC) 5 MG tablet Take      1 &1/2 tablets (7.5 mg)        Daily     For BP & Heart (Patient taking differently: Take 7.5 mg by mouth daily.) 135 tablet 0  . Ascorbic Acid (VITAMIN C WITH ROSE HIPS) 500 MG tablet Take 500 mg by mouth daily.    [name] atorvastatin (LIPITOR) 80 MG tablet TAKE 1 TABLET(80 MG) BY MOUTH DAILY AT 6 PM (Patient taking differently: Take 40 mg by mouth daily.) 90 tablet 3  . B Complex Vitamins (B COMPLEX PO) Take 1 tablet by mouth daily.    . carvedilol (COREG) 12.5 MG tablet Take 2 tablet 2 x  /day for BP & Heart  (Patient taking differently: Take 25 mg by mouth 2 (two) times daily with a meal.) 360 tablet 0  . cholecalciferol (VITAMIN D) 1000 UNITS tablet Take 2,000 Units by mouth daily.    . colchicine 0.6 MG tablet Take 1 tablet daily to prevent Gout Attacks (Patient taking differently: Take 0.6-1.2 mg by mouth daily as needed (flare up).) 90 tablet 3  . levothyroxine (SYNTHROID) 100 MCG tablet Take      1 tablet       Daily       on an empty stomach with only water for 30 minutes & no Antacid meds, Calcium or Magnesium for 4 hours & avoid Biotin (Patient taking differently: Take 100 mcg by mouth daily before breakfast.) 90 tablet 0  . loratadine (CLARITIN) 10 MG tablet Take 10 mg by mouth daily.    . magnesium gluconate (MAGONATE)  500 MG tablet Take 1,000 mg by mouth daily.     . metFORMIN (GLUCOPHAGE-XR) 500 MG 24 hr tablet Take     2 tablets     2 x /day     with Meals       for Diabetes (Patient taking differently: Take 1,000 mg by mouth 2 (two) times daily.) 360 tablet 0  . olmesartan (BENICAR) 40 MG tablet Take 1 tablet Daily for BP (Patient taking differently: Take 40 mg by mouth daily.) 90 tablet 0  . Omega-3 Fatty Acids (FISH OIL) 1200 MG CAPS Take 1,200 mg by mouth daily.    [name] warfarin (COUMADIN) 5 MG tablet TAKE 1 OR 1 AND 1/2 TABLET BY MOUTH EVERY DAY OR AS DIRECTED BY COUMADIN CLINIC (Patient taking differently: Take 5-7.5 mg by mouth See admin instructions. Takes 1 tablet (5 mg totally) by mouth on Thurs and Suns; take 1.5 tablets (7.5 mg totally) by mouth all other days) 45 tablet 3  . zinc gluconate 50 MG tablet Take 50 mg by mouth daily.      Allergies as of 05/26/2020 - Review Complete 05/07/2020  Allergen Reaction Noted  . Amiodarone Other (See Comments) 07/23/2016  . Bee venom Other (See Comments) 04/05/2015    Family History  Problem Relation Age of Onset  . Lung cancer Mother   . Lung cancer Father   . Colon cancer Sister     Social History   Socioeconomic History  .  Marital status: Single    Spouse name: Not on file  . Number of children: Not on file  . Years of education: Not on file  . Highest education level: Not on file  Occupational History  . Not on file  Tobacco Use  . Smoking status: Never Smoker  . Smokeless tobacco: Never Used  Vaping Use  . Vaping Use: Never used  Substance and Sexual Activity  . Alcohol use: No  . Drug use: No  . Sexual activity: Not Currently  Other Topics Concern  . Not on file  Social History Narrative   He is single, he has been in [employer] for his whole life.  Currently struggling to find much work.09/28/2017   Denies alcohol tobacco or drug use   Social Determinants of Health   Financial Resource Strain: Not on file  Food Insecurity: Not on file  Transportation Needs: Not on file  Physical Activity: Not on file  Stress: Not on file  Social Connections: Not on file  Intimate Partner Violence: Not on file    Review of Systems: Gen: Denies fever, sweats or chills. No weight loss.  CV: Denies chest pain, palpitations or edema. Resp: Denies cough, shortness of breath of hemoptysis.  GI: See HPI. GU : Denies urinary burning, blood in urine, increased urinary frequency or incontinence. MS: Denies joint pain, muscles aches or weakness. Derm: Denies rash, itchiness, skin lesions or unhealing ulcers. Psych: Denies depression, anxiety or memory loss. Heme: Denies easy bruising, bleeding. Neuro:  Denies headaches, dizziness or paresthesias. Endo:  + DM II.   Physical Exam: Vital signs in last 24 hours: Temp:  [98.1 F (36.7 C)-99.1 F (37.3 C)] 99.1 F (37.3 C) (01/03 0228) Pulse Rate:  [63-76] 71 (01/03 1145) Resp:  [14-33] 24 (01/03 1145) BP: (134-175)/(58-122) 170/72 (01/03 1145) SpO2:  [91 %-98 %] 92 % (01/03 1145) FiO2 (%):  [21 %] 21 % (01/03 1002)   General: Alert 70 year old male in no acute distress. Head:  Normocephalic and atraumatic. Eyes:  No scleral icterus. Conjunctiva  pink. Ears:  Normal auditory acuity. Nose:  No deformity, discharge or lesions. Mouth:  Dentition intact. No ulcers or lesions.  Neck:  Supple. No lymphadenopathy or thyromegaly.  Lungs: Breath sounds clear throughout. Heart: Regular rate and rhythm, no murmurs. Abdomen: Nondistended but mildly firm on palpation, no mass or organomegaly.  Mild left lower quadrant tenderness without rebound or guarding.  Hypoactive bowel sounds to all four quadrants. Rectal: No external hemorrhoids.  Poor anal sphincter tone.  Watery loose dark brown stool grossly heme positive. Musculoskeletal:  Symmetrical without gross deformities.  Pulses:  Normal pulses noted. Extremities:  Bilateral LEs with 2 - 3+ pitting edema.  Neurologic:  Alert and  oriented x4. No focal deficits.  Skin:  Intact without significant lesions or rashes. Psych:  Alert and cooperative. Normal mood and affect.  Intake/Output from previous day: No intake/output data recorded. Intake/Output this shift: No intake/output data recorded.  Lab Results: Recent Labs    05/26/20 1558  WBC 10.5  HGB 14.0  HCT 41.8  PLT 277   BMET Recent Labs    05/26/20 1558  NA 140  K 3.8  CL 101  CO2 24  GLUCOSE 131*  BUN 32*  CREATININE 1.36*  CALCIUM 9.3   LFT Recent Labs    05/26/20 1558  PROT 7.7  ALBUMIN 3.6  AST 69*  ALT 36  ALKPHOS 91  BILITOT 1.5*   PT/INR Recent Labs    05/27/20 0829  LABPROT 20.6*  INR 1.8*   Hepatitis Panel No results for input(s): HEPBSAG, HCVAB, HEPAIGM, HEPBIGM in the last 72 hours.    Studies/Results: No results found.  IMPRESSION/PLAN:  2.  70 year old male admitted to the hospital with lower abdominal pain, constipation and melenic stools which started after he took 6 doses of Miralax.  Hemoglobin 14 -> 13.3.  No frank melena or bright red rectal bleeding since arriving to the ED.  He passed a small amount of dark brown watery stool earlier today.  FOBT positive.  On Pantoprazole  infusion. Last dose of Warfarin was on 05/25/2020. -Hold Warfarin -Continue IV PPI infusion for now -Continue to monitor H&H closely -Transfuse for hemoglobin less than eight -Abdominal x-rays ordered, not yet completed -Recommend abdominal/pelvic CT with oral contrast (no IV contrast with elevated creatinine level) -Defer decision regarding endoscopic evaluation until CTAP results received -Await further recommendations per Dr. Hilarie Fredrickson  2. History of atrial fibrillation on warfarin -Hold Warfarin as noted above.  3. History of CAD status post coronary stent x 3 in 2015  4. Mildly elevated ALT and total bilirubin levels.  SARS coronavirus 2 negative. -Acute hepatitis panel -Repeat hepatic panel in a.m. -Add direct bilirubin level to prior lab draw  5.  Lower extremity edema -Consider repeat echo during this hospital admission  6. History of Sessile serrated colon polyp. Family history of colon cancer. -Next colonoscopy due 09/2022    Noralyn Pick  05/27/2020, 11:47 AM

## 2020-05-28 ENCOUNTER — Other Ambulatory Visit: Payer: Self-pay | Admitting: Physician Assistant

## 2020-05-28 ENCOUNTER — Encounter (HOSPITAL_COMMUNITY): Payer: Self-pay | Admitting: Internal Medicine

## 2020-05-28 ENCOUNTER — Inpatient Hospital Stay (HOSPITAL_COMMUNITY): Payer: Medicare Other | Admitting: Anesthesiology

## 2020-05-28 ENCOUNTER — Encounter (HOSPITAL_COMMUNITY)
Admission: EM | Disposition: A | Discharge status: Home Health | Payer: Self-pay | Source: Home / Self Care | Attending: Internal Medicine

## 2020-05-28 DIAGNOSIS — K2970 Gastritis, unspecified, without bleeding: Secondary | ICD-10-CM

## 2020-05-28 DIAGNOSIS — K2990 Gastroduodenitis, unspecified, without bleeding: Secondary | ICD-10-CM

## 2020-05-28 DIAGNOSIS — I10 Essential (primary) hypertension: Secondary | ICD-10-CM | POA: Diagnosis not present

## 2020-05-28 DIAGNOSIS — K269 Duodenal ulcer, unspecified as acute or chronic, without hemorrhage or perforation: Secondary | ICD-10-CM | POA: Diagnosis not present

## 2020-05-28 HISTORY — PX: BIOPSY: SHX5522

## 2020-05-28 HISTORY — PX: ESOPHAGOGASTRODUODENOSCOPY (EGD) WITH PROPOFOL: SHX5813

## 2020-05-28 LAB — HEPATITIS PANEL, ACUTE
HCV Ab: NONREACTIVE
Hep A IgM: NONREACTIVE
Hep B C IgM: NONREACTIVE
Hepatitis B Surface Ag: NONREACTIVE

## 2020-05-28 LAB — CBC
HCT: 36.1 % — ABNORMAL LOW (ref 39.0–52.0)
HCT: 40.7 % (ref 39.0–52.0)
Hemoglobin: 12.4 g/dL — ABNORMAL LOW (ref 13.0–17.0)
Hemoglobin: 13.2 g/dL (ref 13.0–17.0)
MCH: 33 pg (ref 26.0–34.0)
MCH: 31.9 pg (ref 26.0–34.0)
MCHC: 34.3 g/dL (ref 30.0–36.0)
MCHC: 32.4 g/dL (ref 30.0–36.0)
MCV: 96 fL (ref 80.0–100.0)
MCV: 98.3 fL (ref 80.0–100.0)
Platelets: 251 10*3/uL (ref 150–400)
Platelets: 273 10*3/uL (ref 150–400)
RBC: 3.76 MIL/uL — ABNORMAL LOW (ref 4.22–5.81)
RBC: 4.14 MIL/uL — ABNORMAL LOW (ref 4.22–5.81)
RDW: 16.1 % — ABNORMAL HIGH (ref 11.5–15.5)
RDW: 15.9 % — ABNORMAL HIGH (ref 11.5–15.5)
WBC: 7.8 10*3/uL (ref 4.0–10.5)
WBC: 8.5 10*3/uL (ref 4.0–10.5)
nRBC: 0 % (ref 0.0–0.2)
nRBC: 0 % (ref 0.0–0.2)

## 2020-05-28 LAB — BASIC METABOLIC PANEL
Anion gap: 10 (ref 5–15)
BUN: 10 mg/dL (ref 8–23)
CO2: 26 mmol/L (ref 22–32)
Calcium: 8.5 mg/dL — ABNORMAL LOW (ref 8.9–10.3)
Chloride: 103 mmol/L (ref 98–111)
Creatinine, Ser: 0.9 mg/dL (ref 0.61–1.24)
GFR, Estimated: >60 mL/min (ref >60)
Glucose, Bld: 126 mg/dL — ABNORMAL HIGH (ref 70–99)
Potassium: 3.4 mmol/L — ABNORMAL LOW (ref 3.5–5.1)
Sodium: 139 mmol/L (ref 135–145)

## 2020-05-28 LAB — GLUCOSE, CAPILLARY
Glucose-Capillary: 112 mg/dL — ABNORMAL HIGH (ref 70–99)
Glucose-Capillary: 129 mg/dL — ABNORMAL HIGH (ref 70–99)
Glucose-Capillary: 196 mg/dL — ABNORMAL HIGH (ref 70–99)
Glucose-Capillary: 141 mg/dL — ABNORMAL HIGH (ref 70–99)

## 2020-05-28 LAB — HEPATIC FUNCTION PANEL
ALT: 29 U/L (ref 0–44)
AST: 30 U/L (ref 15–41)
Albumin: 2.9 g/dL — ABNORMAL LOW (ref 3.5–5.0)
Alkaline Phosphatase: 77 U/L (ref 38–126)
Bilirubin, Direct: 0.2 mg/dL (ref 0.0–0.2)
Indirect Bilirubin: 1.1 mg/dL — ABNORMAL HIGH (ref 0.3–0.9)
Total Bilirubin: 1.3 mg/dL — ABNORMAL HIGH (ref 0.3–1.2)
Total Protein: 6.6 g/dL (ref 6.5–8.1)

## 2020-05-28 LAB — MAGNESIUM: Magnesium: 1.5 mg/dL — ABNORMAL LOW (ref 1.7–2.4)

## 2020-05-28 SURGERY — ESOPHAGOGASTRODUODENOSCOPY (EGD) WITH PROPOFOL
Anesthesia: Monitor Anesthesia Care

## 2020-05-28 MED ORDER — MAGNESIUM SULFATE 2 GM/50ML IV SOLN
2.0000 g | Freq: Once | INTRAVENOUS | Status: AC
  Administered 2020-05-28: 2 g via INTRAVENOUS
  Filled 2020-05-28: qty 50

## 2020-05-28 MED ORDER — LACTATED RINGERS IV SOLN: INTRAVENOUS | Status: DC | PRN

## 2020-05-28 MED ORDER — SODIUM CHLORIDE 0.9 % IV SOLN: INTRAVENOUS | Status: DC

## 2020-05-28 MED ORDER — SODIUM CHLORIDE 0.9 % IV SOLN
1.0000 g | Freq: Once | INTRAVENOUS | Status: AC
  Administered 2020-05-28: 1 g via INTRAVENOUS
  Filled 2020-05-28 (×2): qty 10

## 2020-05-28 MED ORDER — LABETALOL HCL 5 MG/ML IV SOLN
INTRAVENOUS | Status: DC | PRN
  Administered 2020-05-28: 5 mg via INTRAVENOUS

## 2020-05-28 MED ORDER — PROPOFOL 500 MG/50ML IV EMUL
INTRAVENOUS | Status: DC | PRN
  Administered 2020-05-28: 100 ug/kg/min via INTRAVENOUS

## 2020-05-28 MED ORDER — PANTOPRAZOLE SODIUM 40 MG PO TBEC
40.0000 mg | DELAYED_RELEASE_TABLET | Freq: Two times a day (BID) | ORAL | Status: DC
  Administered 2020-05-28 – 2020-05-29 (×3): 40 mg via ORAL
  Filled 2020-05-28 (×4): qty 1

## 2020-05-28 MED ORDER — GLYCOPYRROLATE 0.2 MG/ML IJ SOLN
INTRAMUSCULAR | Status: DC | PRN
  Administered 2020-05-28 (×2): .1 mg via INTRAVENOUS

## 2020-05-28 MED ORDER — LIDOCAINE VISCOUS HCL 2 % MT SOLN
OROMUCOSAL | Status: AC
  Filled 2020-05-28: qty 15

## 2020-05-28 MED ORDER — POTASSIUM CHLORIDE CRYS ER 20 MEQ PO TBCR
40.0000 meq | EXTENDED_RELEASE_TABLET | Freq: Once | ORAL | Status: AC
  Administered 2020-05-28: 40 meq via ORAL
  Filled 2020-05-28: qty 2

## 2020-05-28 MED ORDER — PROPOFOL 10 MG/ML IV BOLUS
INTRAVENOUS | Status: DC | PRN
  Administered 2020-05-28: 10 mg via INTRAVENOUS
  Administered 2020-05-28: 20 mg via INTRAVENOUS
  Administered 2020-05-28: 10 mg via INTRAVENOUS
  Administered 2020-05-28: 20 mg via INTRAVENOUS

## 2020-05-28 MED ORDER — DEXMEDETOMIDINE (PRECEDEX) IN NS 20 MCG/5ML (4 MCG/ML) IV SYRINGE
PREFILLED_SYRINGE | INTRAVENOUS | Status: DC | PRN
  Administered 2020-05-28 (×2): 4 ug via INTRAVENOUS

## 2020-05-28 SURGICAL SUPPLY — 15 items

## 2020-05-28 NOTE — Procedures (Addendum)
I spoke to the patient's brother, Christen Bame, by phone after the procedure We discussed the findings and treatment plans. Time provided for questions and answers and he thanked me for the call

## 2020-05-28 NOTE — Interval H&P Note (Signed)
History and Physical Interval Note: For EGD today to evaluate recent dark, heme + stools and duodenitis/thcikening suggested by CTA abd/pelvis yesterday. The nature of the procedure, as well as the risks, benefits, and alternatives were carefully and thoroughly reviewed with the patient. Ample time for discussion and questions allowed. The patient understood, was satisfied, and agreed to proceed.   CBC Latest Ref Rng & Units 05/28/2020 05/27/2020 05/27/2020  WBC 4.0 - 10.5 K/uL 7.8 - -  Hemoglobin 13.0 - 17.0 g/dL 12.4(L) 12.6(L) 12.6(L)  Hematocrit 39.0 - 52.0 % 36.1(L) 37.0(L) 38.9(L)  Platelets 150 - 400 K/uL 251 - -     05/28/2020 8:16 AM  Jerral Ralph  has presented today for surgery, with the diagnosis of melena.  The various methods of treatment have been discussed with the patient and family. After consideration of risks, benefits and other options for treatment, the patient has consented to  Procedure(s): ESOPHAGOGASTRODUODENOSCOPY (EGD) WITH PROPOFOL (N/A) as a surgical intervention.  The patient's history has been reviewed, patient examined, no change in status, stable for surgery.  I have reviewed the patient's chart and labs.  Questions were answered to the patient's satisfaction.     Carie Caddy Pyrtle

## 2020-05-28 NOTE — Evaluation (Signed)
Physical Therapy Evaluation Patient Details Name: Christopher Wade MRN: [id number] DOB: 19-Dec-1950 Today's Date: 05/28/2020   History of Present Illness  70yo male c/o black stools for the past 5 days, LLQ pain. Admitted for possible upper GIB. PMH CAD, gout, HLD, HTN, A-fib, DM, hx cardioversion, THA  Clinical Impression   Patient received in bed, pleasant but anxious regarding his medical situation. Able to mobilize on a min guard to MinA basis with RW, steady with device but easily fatigued. Did need cues for safe use of device, but also has not used a RW before. Attempted but unable to have a BM. Left in bed in chair position positioned to comfort, all needs met and bed alarm active. Would benefit from skilled HHPT follow-up moving forward.     Follow Up Recommendations Home health PT    Equipment Recommendations  Rolling walker with 5" wheels;3in1 (PT)    Recommendations for Other Services       Precautions / Restrictions Precautions Precautions: Fall Restrictions Weight Bearing Restrictions: No      Mobility  Bed Mobility Overal bed mobility: Needs Assistance Bed Mobility: Supine to Sit;Sit to Supine     Supine to sit: Min assist;HOB elevated Sit to supine: Min guard   General bed mobility comments: MinA to bring trunk up to sitting at EOB, able to get back into flat bed with min guard/extended time    Transfers Overall transfer level: Needs assistance Equipment used: Rolling walker (2 wheeled) Transfers: Sit to/from Stand Sit to Stand: Min guard         General transfer comment: min guard for safety, cues for hand placement  Ambulation/Gait Ambulation/Gait assistance: Min guard Gait Distance (Feet): 30 Feet (32f x2) Assistive device: Rolling walker (2 wheeled) Gait Pattern/deviations: Step-through pattern;Trunk flexed;Decreased step length - right;Decreased step length - left;Decreased stride length Gait velocity: decreased   General Gait Details: slow  but steady with RW, did need cues for RW management during transfers. Easily fatigued.  Stairs            Wheelchair Mobility    Modified Rankin (Stroke Patients Only)       Balance Overall balance assessment: Mild deficits observed, not formally tested                                           Pertinent Vitals/Pain Pain Assessment: No/denies pain    Home Living Family/patient expects to be discharged to:: Private residence Living Arrangements: Alone Available Help at Discharge: Family;Available PRN/intermittently (brother [place] in [place] Type of Home: Other(Comment) (double wide trailer) Home Access: Stairs to enter Entrance Stairs-Rails: None Entrance Stairs-Number of Steps: 2 no rails Home Layout: One level Home Equipment: Crutches;Grab bars - toilet;Cane - single point      Prior Function Level of Independence: Independent with assistive device(s)         Comments: intermittently uses cane and crutch for balance, otherwise does well     Hand Dominance        Extremity/Trunk Assessment   Upper Extremity Assessment Upper Extremity Assessment: Generalized weakness    Lower Extremity Assessment Lower Extremity Assessment: Generalized weakness    Cervical / Trunk Assessment Cervical / Trunk Assessment: Normal  Communication   Communication: No difficulties  Cognition Arousal/Alertness: Awake/alert Behavior During Therapy: WFL for tasks assessed/performed;Flat affect Overall Cognitive Status: Within Functional Limits for tasks assessed  General Comments      Exercises     Assessment/Plan    PT Assessment Patient needs continued PT services  PT Problem List Decreased strength;Obesity;Decreased knowledge of use of DME;Decreased activity tolerance;Decreased balance;Decreased mobility;Decreased coordination       PT Treatment Interventions DME instruction;Balance  training;Gait training;Stair training;Functional mobility training;Patient/family education;Therapeutic activities;Therapeutic exercise    PT Goals (Current goals can be found in the Care Plan section)  Acute Rehab PT Goals Patient Stated Goal: to be able to eat again PT Goal Formulation: With patient Time For Goal Achievement: 06/11/20 Potential to Achieve Goals: Good    Frequency Min 3X/week   Barriers to discharge        Co-evaluation               AM-PAC PT "6 Clicks" Mobility  Outcome Measure Help needed turning from your back to your side while in a flat bed without using bedrails?: A Little Help needed moving from lying on your back to sitting on the side of a flat bed without using bedrails?: A Little Help needed moving to and from a bed to a chair (including a wheelchair)?: A Little Help needed standing up from a chair using your arms (e.g., wheelchair or bedside chair)?: A Little Help needed to walk in hospital room?: A Little Help needed climbing 3-5 steps with a railing? : A Little 6 Click Score: 18    End of Session Equipment Utilized During Treatment: Gait belt Activity Tolerance: Patient limited by fatigue Patient left: in bed;with call bell/phone within reach;with bed alarm set (in chair position) Nurse Communication: Mobility status PT Visit Diagnosis: Muscle weakness (generalized) (M62.81);Difficulty in walking, not elsewhere classified (R26.2)    Time: 5465-6812 PT Time Calculation (min) (ACUTE ONLY): 30 min   Charges:   PT Evaluation $PT Eval Moderate Complexity: 1 Mod PT Treatments $Gait Training: 8-22 mins        Windell Norfolk, DPT, PN1   Supplemental Physical Therapist [place]    Pager [phone] Acute Rehab Office [phone]

## 2020-05-28 NOTE — Anesthesia Postprocedure Evaluation (Signed)
Anesthesia Post Note  Patient: Christopher Wade  Procedure(s) Performed: ESOPHAGOGASTRODUODENOSCOPY (EGD) WITH PROPOFOL (N/A ) BIOPSY     Patient location during evaluation: Endoscopy Anesthesia Type: MAC Level of consciousness: awake and alert Pain management: pain level controlled Vital Signs Assessment: post-procedure vital signs reviewed and stable Respiratory status: spontaneous breathing, nonlabored ventilation and respiratory function stable Cardiovascular status: blood pressure returned to baseline and stable Postop Assessment: no apparent nausea or vomiting Anesthetic complications: no   No complications documented.  Last Vitals:  Vitals:   05/28/20 0910 05/28/20 1201  BP: (!) 175/62 (!) 166/80  Pulse: 67 69  Resp: (!) 25 20  Temp:  36.6 C  SpO2: 90% 92%    Last Pain:  Vitals:   05/28/20 1201  TempSrc: Oral  PainSc:                  Lidia Collum

## 2020-05-28 NOTE — Transfer of Care (Signed)
Immediate Anesthesia Transfer of Care Note  Patient: Christopher Wade  Procedure(s) Performed: ESOPHAGOGASTRODUODENOSCOPY (EGD) WITH PROPOFOL (N/A ) BIOPSY  Patient Location: PACU  Anesthesia Type:MAC  Level of Consciousness: drowsy  Airway & Oxygen Therapy: Patient Spontanous Breathing and Patient connected to face mask oxygen  Post-op Assessment: Report given to RN and Post -op Vital signs reviewed and stable  Post vital signs: Reviewed and stable  Last Vitals:  Vitals Value Taken Time  BP    Temp    Pulse    Resp    SpO2      Last Pain:  Vitals:   05/28/20 0740  TempSrc: Oral  PainSc: 0-No pain         Complications: No complications documented.

## 2020-05-28 NOTE — Progress Notes (Signed)
Pt transported to Endo 

## 2020-05-28 NOTE — Progress Notes (Signed)
Progress Note    Christopher Wade  [id number] DOB: 09-21-1950  DOA: 05/26/2020 PCP: [name], MD    Brief Narrative:     Medical records reviewed and are as summarized below:  Christopher Wade is an 70 y.o. male with medical history significant of hypertension, hyperlipidemia, persistent atrial fibrillation on Coumadin, diabetes mellitus type 2, and hypothyroidism presents with complaints of black stools for the last 5 days.  He is unsure what onset symptoms.  Reports having several loose stools per day that were all black in appearance.  Bowel movements were occurring so frequently that he sometimes is having accidents and started wearing adult briefs.  S/p EGD: 3 duodenal ulcers with pigmented material.     Assessment/Plan:   Active Problems:   Essential hypertension   Type II diabetes mellitus (HCC)   Hypothyroidism   Upper GI bleed   AKI (acute kidney injury) ([place])   Lower extremity edema   Duodenal ulcer   Gastritis and gastroduodenitis   Suspected upper GI bleed: Acute.  Patient reports several days of dark tarry stools and left lower quadrant pain.  -Serial monitoring of H&H -[facility] consulted:    - 3 duodenal ulcers with pigmented material. This  is the source of heme positive and dark  stools.    - Continue present medications. Continue pantoprazole 40 mg BID-AC x 12 weeks.     - No ibuprofen, naproxen, or other non-steroidal  anti-inflammatory drugs.      - Would hold warfarin for approximately 5 days and  then resume with close attention to any  recurrent dark, melenic, stools.      - Await pathology results to exclude H. Pylori   infection.         - Office follow-up with Dr. Carlean Purl in 4-6 weeks.  Dr. Carlean Purl can discuss whether to repeat  EGD in 12 weeks to assess healing.  Acute kidney injury: -resolved  Lower extremity edema: Acute. Patient with at least 2+ pitting edema in the bilateral lower extremities.  Denies any complaints of shortness of  breath.  BNP 86.8. -Elevate lower extremity  Paroxysmal atrial fibrillation with subtherapeutic INR: Patient reports prior history of ablation and cardioversion.  INR subtherapeutic at 1.8 and last reports taking Coumadin 2 days ago.  Appears to be in sinus rhythm currently. -Hold Coumadin due to bleeding- restart in 5 days  Essential hypertension: Home blood pressure medications include amlodipine 7.5 mg daily, Coreg 25 mg twice daily, -Continue Coreg and amlodipine  Diabetes mellitus type 2, uncontrolled: Last hemoglobin A1c noted to be 7.3 on 05/07/2020. Home medications include Metformin 100 mg twice daily. -Hypoglycemic protocol -Hold Metformin -CBGs before every meal with moderate SSI  Hypothyroidism: Last TSH was 3.6 on 05/07/2020. -Continue levothyroxine  Elevated AST: Acute.  AST 69 patient, but previously within normal limits.  Suspect likely reactive in nature. -Outpatient follow-up  Hypokalemia/hypomagnesemia -replete   Family Communication/Anticipated D/C date and plan/Code Status   DVT prophylaxis: scd Code Status: Full Code.  Disposition Plan: Status is: Inpatient  Remains inpatient appropriate because:Inpatient level of care appropriate due to severity of illness   Dispo: The patient is from: Home              Anticipated d/c is to: Home              Anticipated d/c date is: 1 day              Patient currently is not  medically stable to d/c.         Medical Consultants:    GI     Subjective:   After eating chicken, feels like a piece got stuck in his throat   Objective:    Vitals:   05/28/20 0850 05/28/20 0900 05/28/20 0910 05/28/20 1201  BP: (!) 159/55 (!) 163/53 (!) 175/62 (!) 166/80  Pulse: 62 62 67 69  Resp: (!) 21 (!) 24 (!) 25 20  Temp:    97.8 F (36.6 C)  TempSrc:    Oral  SpO2: 95% 96% 90% 92%  Weight:      Height:        Intake/Output Summary (Last 24 hours) at 05/28/2020 1323 Last data filed at 05/28/2020  0919 Gross per 24 hour  Intake 441.62 ml  Output 1125 ml  Net -683.38 ml   Filed Weights   05/28/20 0412 05/28/20 0740  Weight: 110.1 kg 99.8 kg    Exam:  General: Appearance:     Overweight male who upon 2nd eval appears uncomfortable in relation to pain in his throat     Lungs:     respirations unlabored  Heart:    Normal heart rate.No murmurs, rubs, or gallops.   MS:   All extremities are intact.   Neurologic:   Awake, alert, oriented x 3.appears frail    Data Reviewed:   I have personally reviewed following labs and imaging studies:  Labs: Labs show the following:   Basic Metabolic Panel: Recent Labs  Lab 05/26/20 1558 05/27/20 1451 05/28/20 0210  NA 140 137 139  K 3.8 3.2* 3.4*  CL 101  --  103  CO2 24  --  26  GLUCOSE 131*  --  126*  BUN 32*  --  10  CREATININE 1.36*  --  0.90  CALCIUM 9.3  --  8.5*  MG  --   --  1.5*   GFR Estimated Creatinine Clearance: 93.4 mL/min (by C-G formula based on SCr of 0.9 mg/dL). Liver Function Tests: Recent Labs  Lab 05/26/20 1558 05/28/20 0210  AST 69* 30  ALT 36 29  ALKPHOS 91 77  BILITOT 1.5* 1.3*  PROT 7.7 6.6  ALBUMIN 3.6 2.9*   No results for input(s): LIPASE, AMYLASE in the last 168 hours. No results for input(s): AMMONIA in the last 168 hours. Coagulation profile Recent Labs  Lab 05/27/20 0829  INR 1.8*    CBC: Recent Labs  Lab 05/26/20 1558 05/27/20 1118 05/27/20 1424 05/27/20 1451 05/28/20 0210  WBC 10.5  --   --   --  7.8  HGB 14.0 13.3 12.6* 12.6* 12.4*  HCT 41.8 41.6 38.9* 37.0* 36.1*  MCV 98.1  --   --   --  96.0  PLT 277  --   --   --  251   Cardiac Enzymes: No results for input(s): CKTOTAL, CKMB, CKMBINDEX, TROPONINI in the last 168 hours. BNP (last 3 results) No results for input(s): PROBNP in the last 8760 hours. CBG: Recent Labs  Lab 05/27/20 1252 05/27/20 1727 05/27/20 2311 05/28/20 0632 05/28/20 1107  GLUCAP 119* 130* 113* 112* 129*   D-Dimer: No results for  input(s): DDIMER in the last 72 hours. Hgb A1c: No results for input(s): HGBA1C in the last 72 hours. Lipid Profile: No results for input(s): CHOL, HDL, LDLCALC, TRIG, CHOLHDL, LDLDIRECT in the last 72 hours. Thyroid function studies: No results for input(s): TSH, T4TOTAL, T3FREE, THYROIDAB in the last 72 hours.  Invalid  input(s): FREET3 Anemia work up: No results for input(s): VITAMINB12, FOLATE, FERRITIN, TIBC, IRON, RETICCTPCT in the last 72 hours. Sepsis Labs: Recent Labs  Lab 05/26/20 1558 05/28/20 0210  WBC 10.5 7.8    Microbiology Recent Results (from the past 240 hour(s))  SARS CORONAVIRUS 2 (TAT 6-24 HRS) Nasopharyngeal Nasopharyngeal Swab     Status: None   Collection Time: 05/27/20  8:30 AM   Specimen: Nasopharyngeal Swab  Result Value Ref Range Status   SARS Coronavirus 2 NEGATIVE NEGATIVE Final    Comment: (NOTE) SARS-CoV-2 target nucleic acids are NOT DETECTED.  The SARS-CoV-2 RNA is generally detectable in upper and lower respiratory specimens during the acute phase of infection. Negative results do not preclude SARS-CoV-2 infection, do not rule out co-infections with other pathogens, and should not be used as the sole basis for treatment or other patient management decisions. Negative results must be combined with clinical observations, patient history, and epidemiological information. The expected result is Negative.  Fact Sheet for Patients: HairSlick.no  Fact Sheet for Healthcare Providers: quierodirigir.com  This test is not yet approved or cleared by the [place] FDA and  has been authorized for detection and/or diagnosis of SARS-CoV-2 by FDA under an Emergency Use Authorization (EUA). This EUA will remain  in effect (meaning this test can be used) for the duration of the COVID-19 declaration under Se ction 564(b)(1) of the Act, 21 U.S.C. section 360bbb-3(b)(1), unless the authorization is  terminated or revoked sooner.  Performed at [facility], [address] [place], [place] [address]     Procedures and diagnostic studies:  DG ABD ACUTE 2+V W 1V CHEST  Result Date: 05/27/2020 CLINICAL DATA:  Generalized abdominal pain X 1 day.  GI bleed. EXAM: DG ABDOMEN ACUTE WITH 1 VIEW CHEST COMPARISON:  CT abdomen/pelvis 05/27/2020, chest x-ray 07/24/2018 FINDINGS: There is no evidence of dilated bowel loops or free intraperitoneal air. Excreted intravenous contrast noted to opacify the urinary bladder lumen and known right diverticula as well as partially opacifying bilateral renal collecting systems. No radiopaque calculi or other significant radiographic abnormality is seen. The heart size and mediastinal contours are unchanged. Bibasilar compressive changes. Left base bullous disease better appreciated on CT abdomen pelvis 05/27/2020. No focal consolidation. No pulmonary edema. No pleural effusion. No pneumothorax. No acute osseous abnormality. Multilevel degenerative changes of the spine. Bilateral acromioclavicular degenerative changes. IMPRESSION: 1. Nonobstructive bowel gas pattern. Please see separately dictated CT abdomen/pelvis 05/27/2020 for further details. 2. No acute cardiopulmonary disease. Electronically Signed   By: Tish Frederickson M.D.   On: 05/27/2020 15:54   ECHOCARDIOGRAM COMPLETE  Result Date: 05/27/2020    ECHOCARDIOGRAM REPORT   Patient Name:   KHAZA BLANSETT Date of Exam: 05/27/2020 Medical Rec #:  [id number]     Height:       72.0 in Accession #:    [id number]    Weight:       258.4 lb Date of Birth:  1951-03-27    BSA:          2.376 m Patient Age:    69 years      BP:           170/87 mmHg Patient Gender: M             HR:           71 bpm. Exam Location:  Inpatient Procedure: 2D Echo, Cardiac Doppler and Color Doppler Indications:    CHF  History:  Patient has prior history of Echocardiogram examinations.                 Arrythmias:Atrial Fibrillation; Risk  Factors:Hypertension,                 Diabetes and Dyslipidemia.  Sonographer:    Clayton Lefort RDCS (AE) Referring Phys: [id number] [facility] A SMITH  Sonographer Comments: Technically difficult study due to poor echo windows, suboptimal apical window, suboptimal subcostal window and patient is morbidly obese. Image acquisition challenging due to patient body habitus. IMPRESSIONS  1. Left ventricular ejection fraction, by estimation, is 55 to 60%. The left ventricle has normal function. The left ventricle has no regional wall motion abnormalities. There is moderate concentric left ventricular hypertrophy. Left ventricular diastolic parameters were normal.  2. Right ventricular systolic function is normal. The right ventricular size is normal. There is normal pulmonary artery systolic pressure.  3. Left atrial size was mildly dilated.  4. The mitral valve is normal in structure. No evidence of mitral valve regurgitation. No evidence of mitral stenosis.  5. The aortic valve is tricuspid. Aortic valve regurgitation is not visualized. No aortic stenosis is present.  6. The inferior vena cava is normal in size with greater than 50% respiratory variability, suggesting right atrial pressure of 3 mmHg. FINDINGS  Left Ventricle: Left ventricular ejection fraction, by estimation, is 55 to 60%. The left ventricle has normal function. The left ventricle has no regional wall motion abnormalities. The left ventricular internal cavity size was normal in size. There is  moderate concentric left ventricular hypertrophy. Left ventricular diastolic parameters were normal. Indeterminate filling pressures. Right Ventricle: The right ventricular size is normal. No increase in right ventricular wall thickness. Right ventricular systolic function is normal. There is normal pulmonary artery systolic pressure. The tricuspid regurgitant velocity is 2.86 m/s, and  with an assumed right atrial pressure of 3 mmHg, the estimated right ventricular  systolic pressure is 123XX123 mmHg. Left Atrium: Left atrial size was mildly dilated. Right Atrium: Right atrial size was normal in size. Pericardium: There is no evidence of pericardial effusion. Mitral Valve: The mitral valve is normal in structure. No evidence of mitral valve regurgitation. No evidence of mitral valve stenosis. MV peak gradient, 5.1 mmHg. The mean mitral valve gradient is 2.0 mmHg. Tricuspid Valve: The tricuspid valve is normal in structure. Tricuspid valve regurgitation is trivial. No evidence of tricuspid stenosis. Aortic Valve: The aortic valve is tricuspid. Aortic valve regurgitation is not visualized. No aortic stenosis is present. Aortic valve mean gradient measures 3.0 mmHg. Aortic valve peak gradient measures 4.9 mmHg. Aortic valve area, by VTI measures 2.60 cm. Pulmonic Valve: The pulmonic valve was normal in structure. Pulmonic valve regurgitation is not visualized. No evidence of pulmonic stenosis. Aorta: The aortic root is normal in size and structure. Venous: The inferior vena cava is normal in size with greater than 50% respiratory variability, suggesting right atrial pressure of 3 mmHg. IAS/Shunts: No atrial level shunt detected by color flow Doppler.  LEFT VENTRICLE PLAX 2D LVIDd:         4.40 cm  Diastology LVIDs:         3.10 cm  LV e' medial:    8.92 cm/s LV PW:         1.60 cm  LV E/e' medial:  9.8 LV IVS:        1.57 cm  LV e' lateral:   9.14 cm/s LVOT diam:     2.10 cm  LV  E/e' lateral: 9.6 LV SV:         68 LV SV Index:   29 LVOT Area:     3.46 cm  RIGHT VENTRICLE RV S prime:     15.60 cm/s TAPSE (M-mode): 2.9 cm LEFT ATRIUM             Index LA diam:        4.70 cm 1.98 cm/m LA Vol (A2C):   59.4 ml 25.00 ml/m LA Vol (A4C):   78.8 ml 33.17 ml/m LA Biplane Vol: 75.4 ml 31.74 ml/m  AORTIC VALVE AV Area (Vmax):    2.74 cm AV Area (Vmean):   2.63 cm AV Area (VTI):     2.60 cm AV Vmax:           111.00 cm/s AV Vmean:          75.800 cm/s AV VTI:            0.261 m AV Peak  Grad:      4.9 mmHg AV Mean Grad:      3.0 mmHg LVOT Vmax:         87.70 cm/s LVOT Vmean:        57.600 cm/s LVOT VTI:          0.196 m LVOT/AV VTI ratio: 0.75  AORTA Ao Root diam: 3.60 cm Ao Asc diam:  3.60 cm MITRAL VALVE               TRICUSPID VALVE MV Area (PHT): 3.21 cm    TR Peak grad:   32.7 mmHg MV Peak grad:  5.1 mmHg    TR Vmax:        286.00 cm/s MV Mean grad:  2.0 mmHg MV Vmax:       1.13 m/s    SHUNTS MV Vmean:      62.2 cm/s   Systemic VTI:  0.20 m MV Decel Time: 236 msec    Systemic Diam: 2.10 cm MV E velocity: 87.30 cm/s MV A velocity: 76.60 cm/s MV E/A ratio:  1.14 [name] MD Electronically signed by [name] MD Signature Date/Time: 05/27/2020/5:30:41 PM    Final    CT Angio Abd/Pel w/ and/or w/o  Result Date: 05/27/2020 CLINICAL DATA:  Acute lower GI bleeding, left lower quadrant pain EXAM: CT ANGIOGRAPHY ABDOMEN AND PELVIS WITH CONTRAST AND WITHOUT CONTRAST TECHNIQUE: Multidetector CT imaging of the abdomen and pelvis was performed using the standard protocol during bolus administration of intravenous contrast. Multiplanar reconstructed images and MIPs were obtained and reviewed to evaluate the vascular anatomy. CONTRAST:  150mL OMNIPAQUE IOHEXOL 350 MG/ML SOLN COMPARISON:  None. FINDINGS: VASCULAR Aorta: Calcific atherosclerosis most pronounced of the infrarenal aorta with mild infrarenal aortic luminal narrowing but no occlusive process. Negative for aneurysm. No dissection appreciated or retroperitoneal hemorrhage. No evidence of rupture. Celiac: Atherosclerotic origin but remains patent including its branches. No active arterial bleeding within the celiac vascular territory. SMA: Atherosclerotic origin but remains patent including its branches. No active bleeding in the SMA territory. Renals: Atherosclerotic origins with mild ostial narrowing suspected but both renal arteries remain patent. IMA: Calcified narrowed origin but appears to remain patent off the distal aorta  including its branches. No active lower GI bleeding in the IMA territory. Inflow: Atherosclerotic and tortuous iliac vasculature without inflow disease or occlusion. No acute pelvic arterial vascular finding. Proximal Outflow: Atherosclerotic changes of the femoral vasculature but all remain patent. Veins: No veno-occlusive process. Review of the MIP images  confirms the above findings. NON-VASCULAR Lower chest: Bibasilar atelectasis/hypoventilatory changes. Left lower lobe bleb noted. Mild cardiomegaly. No pericardial or pleural effusion. Hepatobiliary: No focal liver abnormality is seen. No gallstones, gallbladder wall thickening, or biliary dilatation. Pancreas: Mild diffuse fatty replacement most pronounced of the head and uncinate process. No ductal dilatation or acute process. No surrounding free fluid or inflammation. Spleen: Normal in size without focal abnormality. Adrenals/Urinary Tract: Normal adrenal glands. No focal renal abnormality or obstruction pattern. No hydronephrosis. No upper urinary tract calculi. Ureters are symmetric and decompressed. No hydroureter or ureteral obstruction. Bladder demonstrates a right posterior diverticulum containing layering radiopaque gallstones. Diverticula measures 4 cm, image 76/series 12. Areas of bladder wall thickening and smaller diverticula noted on the left side. Bladder stones noted along the left bladder wall measuring up to 8 mm. These bladder findings appear to be chronic. Stomach/Bowel: Slight strandy edema about the duodenum proximally suggesting nonspecific duodenitis. Small duodenal diverticulum also noted with an air-fluid level. Negative for bowel obstruction, significant dilatation, ileus, or free air. No evidence of appendicitis. No acute colitis or diverticulitis. No free fluid, fluid collection, hemorrhage, hematoma, abscess, or ascites. Lymphatic: No bulky adenopathy. Reproductive: Prostate calcifications noted. Lower pelvis is obscured by artifact  from the hip replacements. Other: Intact abdominal wall.  No hernia. Musculoskeletal: Hip arthroplasty is noted bilaterally creating artifact through the lower pelvis. Degenerative changes of the spine. No acute osseous finding. IMPRESSION: VASCULAR Negative for active arterial GI bleeding. No other acute finding by CTA. Aortoiliac atherosclerosis as detailed above. NON-VASCULAR Mild strandy edema and wall thickening of the duodenum, compatible with nonspecific duodenitis. This can be seen with peptic ulcer disease. No evidence of free air or perforation. Consider correlation with upper endoscopy. Chronic bladder diverticula containing radiopaque stones. No acute obstructive uropathy or ureteral calculus. Electronically Signed   By: Jerilynn Mages.  Shick M.D.   On: 05/27/2020 15:18    Medications:   . allopurinol  300 mg Oral Daily  . amLODipine  7.5 mg Oral Daily  . atorvastatin  40 mg Oral Daily  . carvedilol  25 mg Oral BID WC  . insulin aspart  0-15 Units Subcutaneous TID WC  . insulin aspart  0-5 Units Subcutaneous QHS  . levothyroxine  100 mcg Oral QAC breakfast  . pantoprazole  40 mg Oral BID AC  . sodium chloride flush  3 mL Intravenous Q12H   Continuous Infusions:   LOS: 1 day   Geradine Girt  [facility]   How to contact the [facility] Attending or Consulting provider [place] or covering provider during after hours [employer], for this patient?  1. Check the care team in [facility] and look for a) attending/consulting [facility] provider listed and b) the [facility] team listed 2. Log into [email or web address] and use 's universal password to access. If you do not have the password, please contact the hospital operator. 3. Locate the [facility] provider you are looking for under [facility] and page to a number that you can be directly reached. 4. If you still have difficulty reaching the provider, please page the [facility] (Director on Call) for the Hospitalists listed on amion for assistance.  05/28/2020, 1:23 PM

## 2020-05-28 NOTE — Op Note (Signed)
[facility] Patient Name: Christopher Wade Procedure Date : 05/28/2020 MRN: [id number] Attending MD: Jerene Bears , MD Date of Birth: Apr 29, 1951 CSN: [id number] Age: 70 Admit Type: Inpatient Procedure:                Upper GI endoscopy Indications:              Dark, heme positive stool; Abnormal CT of the GI                            tract suggestive duodenitis Providers:                Lajuan Lines. Hilarie Fredrickson, MD, Clyde Lundborg, RN, Laverda Sorenson, Technician, Nicholes Calamity, CRNA Referring MD:             [facility] Medicines:                Monitored Anesthesia Care Complications:            No immediate complications. Estimated Blood Loss:     Estimated blood loss was minimal. Procedure:                Pre-Anesthesia Assessment:                           - Prior to the procedure, a History and Physical                            was performed, and patient medications and                            allergies were reviewed. The patient's tolerance of                            previous anesthesia was also reviewed. The risks                            and benefits of the procedure and the sedation                            options and risks were discussed with the patient.                            All questions were answered, and informed consent                            was obtained. Prior Anticoagulants: The patient has                            taken Coumadin (warfarin), last dose was 3 days                            prior to procedure. ASA Grade Assessment: III - A  patient with severe systemic disease. After                            reviewing the risks and benefits, the patient was                            deemed in satisfactory condition to undergo the                            procedure.                           After obtaining informed consent, the endoscope was                             passed under direct vision. Throughout the                            procedure, the patient's blood pressure, pulse, and                            oxygen saturations were monitored continuously. The                            GIF-H190 ([id number]) Olympus gastroscope was                            introduced through the mouth, and advanced to the                            second part of duodenum. The upper GI endoscopy was                            accomplished without difficulty. The patient                            tolerated the procedure well. Scope In: Scope Out: Findings:      The esophagus and gastroesophageal junction were examined with white       light and narrow band imaging (NBI) from a forward view and retroflexed       position. There were esophageal mucosal changes suggestive of       short-segment Barrett's esophagus. These changes involved the mucosa at       the upper extent of the gastric folds (41 cm from the incisors)       extending to the Z-line (38 cm from the incisors). One tongue of       salmon-colored mucosa was present from 39 to 41 cm and 1 island of       salmon-colored mucosa was present at 38 cm. The maximum longitudinal       extent of these esophageal mucosal changes was 2-3 cm in length. Mucosa       was biopsied with a cold forceps for histology in a targeted manner from       38 to 41 cm from the incisors. One specimen bottle was sent to pathology.  A 2 cm hiatal hernia was present.      Diffuse moderate inflammation characterized by congestion (edema),       erosions and erythema was found in the gastric antrum and in the       prepyloric region of the stomach. Biopsies were taken with a cold       forceps for histology and Helicobacter pylori testing (body, antrum and       incisura).      Three non-bleeding cratered duodenal ulcers (kissing ulcers) with dark       pigmented material were found in the duodenal bulb. The largest lesion        was 12 mm in largest dimension. No visible vessels were seen. Impression:               - Esophageal mucosal changes suggestive of                            short-segment Barrett's esophagus. Biopsied.                           - 2 cm hiatal hernia.                           - Gastritis. Biopsied.                           - 3 duodenal ulcers with pigmented material. This                            is the source of heme positive and dark stools. Moderate Sedation:      N/A Recommendation:           - Return patient to hospital ward for ongoing care.                           - Advance diet as tolerated.                           - Continue present medications. Continue                            pantoprazole 40 mg BID-AC x 12 weeks.                           - No ibuprofen, naproxen, or other non-steroidal                            anti-inflammatory drugs.                           - Would hold warfarin for approximately 5 days and                            then resume with close attention to any recurrent                            dark, melenic, stools.                           -  Await pathology results to exclude H. Pylori                            infection.                           - Office follow-up with Dr. Carlean Purl in 4-6 weeks.                            Dr. Carlean Purl can discuss whether to repeat EGD in 12                            weeks to assess healing. Procedure Code(s):        --- Professional ---                           [phone], Esophagogastroduodenoscopy, flexible,                            transoral; with biopsy, single or multiple Diagnosis Code(s):        --- Professional ---                           K22.8, Other specified diseases of esophagus                           K44.9, Diaphragmatic hernia without obstruction or                            gangrene                           K29.70, Gastritis, unspecified, without bleeding                            K26.9, Duodenal ulcer, unspecified as acute or                            chronic, without hemorrhage or perforation                           R19.5, Other fecal abnormalities                           R93.3, Abnormal findings on diagnostic imaging of                            other parts of digestive tract CPT copyright 2019 [employer]. All rights reserved. The codes documented in this report are preliminary and upon coder review may  be revised to meet current compliance requirements. Jerene Bears, MD 05/28/2020 8:53:19 AM This report has been signed electronically. Number of Addenda: 0

## 2020-05-28 NOTE — Anesthesia Preprocedure Evaluation (Signed)
Anesthesia Evaluation  Patient identified by MRN, date of birth, ID band Patient awake    Reviewed: Allergy & Precautions, NPO status , Patient's Chart, lab work & pertinent test results, reviewed documented beta blocker date and time   History of Anesthesia Complications Negative for: history of anesthetic complications  Airway Mallampati: III  TM Distance: >3 FB Neck ROM: Full    Dental  (+) Teeth Intact   Pulmonary sleep apnea , Patient abstained from smoking.,    Pulmonary exam normal        Cardiovascular hypertension, Pt. on medications and Pt. on home beta blockers + CAD and + Cardiac Stents (2017)  Normal cardiovascular exam+ dysrhythmias (s/p ablation) Atrial Fibrillation      Neuro/Psych B/l carotid stenosis negative psych ROS   GI/Hepatic negative GI ROS, Neg liver ROS, melena   Endo/Other  diabetes, Type 2, Oral Hypoglycemic AgentsHypothyroidism   Renal/GU Renal Insufficiency and ARFRenal disease  negative genitourinary   Musculoskeletal  (+) Arthritis ,   Abdominal   Peds  Hematology  (+) Blood dyscrasia (Hgb 12.4), anemia , Coumadin   Anesthesia Other Findings  Echo 05/27/20: EF 55-60%, mod LVH, normal RV function, valves unremarkable  Stress test 2018:   Nuclear stress EF: 61%.  There was no ST segment deviation noted during stress.  The study is normal.  This is a low risk study. Low risk stress nuclear study with normal perfusion and normal left ventricular regional and global systolic function.  Reproductive/Obstetrics                           Anesthesia Physical Anesthesia Plan  ASA: III  Anesthesia Plan: MAC   Post-op Pain Management:    Induction: Intravenous  PONV Risk Score and Plan: 1 and Propofol infusion, TIVA and Treatment may vary due to age or medical condition  Airway Management Planned: Natural Airway, Nasal Cannula and Simple Face  Mask  Additional Equipment: None  Intra-op Plan:   Post-operative Plan:   Informed Consent: I have reviewed the patients History and Physical, chart, labs and discussed the procedure including the risks, benefits and alternatives for the proposed anesthesia with the patient or authorized representative who has indicated his/her understanding and acceptance.       Plan Discussed with:   Anesthesia Plan Comments:         Anesthesia Quick Evaluation

## 2020-05-29 DIAGNOSIS — N179 Acute kidney failure, unspecified: Secondary | ICD-10-CM | POA: Diagnosis not present

## 2020-05-29 DIAGNOSIS — K922 Gastrointestinal hemorrhage, unspecified: Secondary | ICD-10-CM | POA: Diagnosis not present

## 2020-05-29 DIAGNOSIS — K2270 Barrett's esophagus without dysplasia: Secondary | ICD-10-CM | POA: Diagnosis not present

## 2020-05-29 DIAGNOSIS — K269 Duodenal ulcer, unspecified as acute or chronic, without hemorrhage or perforation: Secondary | ICD-10-CM | POA: Diagnosis not present

## 2020-05-29 LAB — GLUCOSE, CAPILLARY
Glucose-Capillary: 146 mg/dL — ABNORMAL HIGH (ref 70–99)
Glucose-Capillary: 120 mg/dL — ABNORMAL HIGH (ref 70–99)

## 2020-05-29 LAB — CBC
HCT: 37.5 % — ABNORMAL LOW (ref 39.0–52.0)
Hemoglobin: 12.6 g/dL — ABNORMAL LOW (ref 13.0–17.0)
MCH: 32.7 pg (ref 26.0–34.0)
MCHC: 33.6 g/dL (ref 30.0–36.0)
MCV: 97.4 fL (ref 80.0–100.0)
Platelets: 238 10*3/uL (ref 150–400)
RBC: 3.85 MIL/uL — ABNORMAL LOW (ref 4.22–5.81)
RDW: 16 % — ABNORMAL HIGH (ref 11.5–15.5)
WBC: 8 10*3/uL (ref 4.0–10.5)
nRBC: 0 % (ref 0.0–0.2)

## 2020-05-29 LAB — BASIC METABOLIC PANEL
Anion gap: 12 (ref 5–15)
BUN: 12 mg/dL (ref 8–23)
CO2: 26 mmol/L (ref 22–32)
Calcium: 8.8 mg/dL — ABNORMAL LOW (ref 8.9–10.3)
Chloride: 101 mmol/L (ref 98–111)
Creatinine, Ser: 0.94 mg/dL (ref 0.61–1.24)
GFR, Estimated: >60 mL/min (ref >60)
Glucose, Bld: 145 mg/dL — ABNORMAL HIGH (ref 70–99)
Potassium: 3.5 mmol/L (ref 3.5–5.1)
Sodium: 139 mmol/L (ref 135–145)

## 2020-05-29 LAB — SURGICAL PATHOLOGY

## 2020-05-29 MED ORDER — PANTOPRAZOLE SODIUM 40 MG PO TBEC: 40.0000 mg | DELAYED_RELEASE_TABLET | Freq: Two times a day (BID) | ORAL | 0 refills | Status: DC

## 2020-05-29 MED ORDER — WARFARIN SODIUM 5 MG PO TABS: ORAL_TABLET | ORAL | 3 refills | Status: DC

## 2020-05-29 NOTE — TOC Initial Note (Signed)
Transition of Care [facility]) - Initial/Assessment Note    Patient Details  Name: Christopher Wade MRN: [id number] Date of Birth: 25-Jan-1951  Transition of Care [facility]) CM/SW Contact:    Marilu Favre, RN Phone Number: 05/29/2020, 3:00 PM  Clinical Narrative:                 Patient will be staying with his brother Edd Arbour at discharge.   Ronnie's address is [place]  Confirmed patient's cell number  Patient has a walker at home already. Ordered 3 in1   Expected Discharge Plan: [place] Barriers to Discharge: No Barriers Identified   Patient Goals and CMS Choice Patient states their goals for this hospitalization and ongoing recovery are:: to return to home CMS Medicare.gov Compare Post Acute Care list provided to:: Patient Choice offered to / list presented to : Patient  Expected Discharge Plan and Services Expected Discharge Plan: [place]   Discharge Planning Services: CM Consult Post Acute Care Choice: [place] arrangements for the past 2 months: Single Family Home Expected Discharge Date: 05/29/20               DME Arranged: 3-N-1 DME Agency: [employer] Date DME Agency Contacted: 05/29/20 Time DME Agency Contacted: [phone] Representative spoke with at DME Agency: [place] Arranged: PT [place]: [place] Date [place]: 05/29/20 Time [place]: 58 Representative spoke with at [place]: [place] Arrangements/Services Living arrangements for the past 2 months: [place] Lives with:: Self Patient language and need for interpreter reviewed:: Yes Do you feel safe going back to the place where you live?: Yes      Need for Family Participation in Patient Care: Yes (Comment) Care giver support system in place?: Yes (comment) Current home services: DME Criminal Activity/Legal Involvement Pertinent to Current  Situation/Hospitalization: No - Comment as needed  Activities of Daily Living Home Assistive Devices/Equipment: Blood pressure cuff,Eyeglasses ADL Screening (condition at time of admission) Patient's cognitive ability adequate to safely complete daily activities?: Yes Is the patient deaf or have difficulty hearing?: No Does the patient have difficulty seeing, even when wearing glasses/contacts?: Yes Does the patient have difficulty concentrating, remembering, or making decisions?: No Patient able to express need for assistance with ADLs?: Yes Does the patient have difficulty dressing or bathing?: No Independently performs ADLs?: No Communication: Independent Dressing (OT): Independent Grooming: Independent Feeding: Independent Bathing: Independent Toileting: Independent In/Out Bed: Independent Walks in Home: Independent Does the patient have difficulty walking or climbing stairs?: No Weakness of Legs: None Weakness of Arms/Hands: None  Permission Sought/Granted   Permission granted to share information with : No              Emotional Assessment Appearance:: Appears stated age Attitude/Demeanor/Rapport: Engaged Affect (typically observed): Accepting Orientation: : Oriented to Self,Oriented to Place,Oriented to  Time,Oriented to Situation Alcohol / Substance Use: Not Applicable Psych Involvement: No (comment)  Admission diagnosis:  Melena [K92.1] GI bleed [K92.2] Upper GI bleed [K92.2] AKI (acute kidney injury) ([place]) [N17.9] Abdominal pain [R10.9] Gastrointestinal hemorrhage, unspecified gastrointestinal hemorrhage type [K92.2] Patient Active Problem List   Diagnosis Date Noted  . Duodenal ulcer   . Gastritis and gastroduodenitis   . Upper GI bleed 05/27/2020  . AKI (acute kidney injury) ([place]) 05/27/2020  . Lower extremity edema 05/27/2020  . Lower abdominal pain   . Melena   . Chronic  anticoagulation   . Aortic atherosclerosis (HCC) 02/02/2020  . BPH with  obstruction/lower urinary tract symptoms 10/20/2018  . Encounter for therapeutic drug monitoring 09/29/2018  . Carotid artery stenosis   . Type II diabetes mellitus (HCC)   . Hypothyroidism   . 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 Persistent atrial fibrillation (HCC)   . CAD (coronary artery disease), native coronary artery 02/12/2016  . Encounter for Medicare annual wellness exam 05/16/2015  . OSA (obstructive sleep apnea)   . Gout 11/12/2014  . Essential hypertension 11/12/2014  . Morbid obesity (HCC) - BMI 30+ with OSA 07/31/2013  . Hyperlipidemia associated with type 2 diabetes mellitus (HCC)   . CKD stage 2 due to type 2 diabetes mellitus (HCC)   . Vitamin D deficiency   . Hx of adenomatous colonic polyps 04/10/2008   PCP:  [name], MD Pharmacy:   [facility] [phone] - [place], [place] - [address] AT [facility] [address] [place] [address] Phone: [phone] Fax: [phone]     Social Determinants of Health (SDOH) Interventions    Readmission Risk Interventions No flowsheet data found.

## 2020-05-29 NOTE — Progress Notes (Signed)
Daily Rounding Note  05/29/2020, 9:54 AM  LOS: 2 days   SUBJECTIVE:   Chief complaint: GIB from duodenal ulcers.  Chronic warfarin on hold. Acute post op dysphagia       Pt served chopped diet at dinner and today's AM meal.  No recurrent dysphagia. Water stool w some blood visible (that is what staff told pt, he did not see the stool just able to sense it was watery).  No abd pain.   Feels well today.  Not dizzy or weak  OBJECTIVE:         Vital signs in last 24 hours:    Temp:  [97.8 F (36.6 C)-98.6 F (37 C)] 98.6 F (37 C) (01/05 0434) Pulse Rate:  [57-69] 63 (01/05 0759) Resp:  [18-20] 18 (01/05 0434) BP: (107-166)/(59-80) 155/63 (01/05 0759) SpO2:  [91 %-94 %] 92 % (01/05 0434) Weight:  [110.2 kg] 110.2 kg (01/05 0434) Last BM Date: 05/29/20 Filed Weights   05/28/20 0412 05/28/20 0740 05/29/20 0434  Weight: 110.1 kg 99.8 kg 110.2 kg   General: looks well, comfortable, talkative   Heart: RRR Chest: clear bil.   Abdomen: soft, NT, ND.  Active BS.    Extremities: no CCE Neuro/Psych:  Oriented x 3.  Loquacious.  No tremors or gross weakness.    Intake/Output from previous day: 01/04 0701 - 01/05 0700 In: 400 [I.V.:400] Out: 325 [Urine:325]  Intake/Output this shift: Total I/O In: 360 [P.O.:360] Out: 175 [Urine:175]  Lab Results: Recent Labs    05/28/20 0210 05/28/20 1503 05/29/20 0213  WBC 7.8 8.5 8.0  HGB 12.4* 13.2 12.6*  HCT 36.1* 40.7 37.5*  PLT 251 273 238   BMET Recent Labs    05/26/20 1558 05/27/20 1451 05/28/20 0210 05/29/20 0213  NA 140 137 139 139  K 3.8 3.2* 3.4* 3.5  CL 101  --  103 101  CO2 24  --  26 26  GLUCOSE 131*  --  126* 145*  BUN 32*  --  10 12  CREATININE 1.36*  --  0.90 0.94  CALCIUM 9.3  --  8.5* 8.8*   LFT Recent Labs    05/26/20 1558 05/27/20 1424 05/28/20 0210  PROT 7.7  --  6.6  ALBUMIN 3.6  --  2.9*  AST 69*  --  30  ALT 36  --  29  ALKPHOS 91   --  77  BILITOT 1.5*  --  1.3*  BILIDIR  --  0.2 0.2  IBILI  --   --  1.1*   PT/INR Recent Labs    05/27/20 0829  LABPROT 20.6*  INR 1.8*   Hepatitis Panel Recent Labs    05/27/20 1424  HEPBSAG NON REACTIVE  HCVAB NON REACTIVE  HEPAIGM NON REACTIVE  HEPBIGM NON REACTIVE    Studies/Results: DG ABD ACUTE 2+V W 1V CHEST  Result Date: 05/27/2020 CLINICAL DATA:  Generalized abdominal pain X 1 day.  GI bleed. EXAM: DG ABDOMEN ACUTE WITH 1 VIEW CHEST COMPARISON:  CT abdomen/pelvis 05/27/2020, chest x-ray 07/24/2018 FINDINGS: There is no evidence of dilated bowel loops or free intraperitoneal air. Excreted intravenous contrast noted to opacify the urinary bladder lumen and known right diverticula as well as partially opacifying bilateral renal collecting systems. No radiopaque calculi or other significant radiographic abnormality is seen. The heart size and mediastinal contours are unchanged. Bibasilar compressive changes. Left base bullous disease better appreciated on CT abdomen pelvis 05/27/2020. No focal  consolidation. No pulmonary edema. No pleural effusion. No pneumothorax. No acute osseous abnormality. Multilevel degenerative changes of the spine. Bilateral acromioclavicular degenerative changes. IMPRESSION: 1. Nonobstructive bowel gas pattern. Please see separately dictated CT abdomen/pelvis 05/27/2020 for further details. 2. No acute cardiopulmonary disease. Electronically Signed   By: Tish Frederickson M.D.   On: 05/27/2020 15:54   ECHOCARDIOGRAM COMPLETE  Result Date: 05/27/2020    ECHOCARDIOGRAM REPORT   [name] Date of Exam: 05/27/2020 Medical Rec #:  [id number]     Height:       72.0 in Accession #:    [id number]    Weight:       258.4 lb Date of Birth:  26-Nov-1950    BSA:          2.376 m Patient Age:    69 years      BP:           170/87 mmHg Patient Gender: M             HR:           71 bpm. Exam Location:  Inpatient Procedure: 2D Echo, Cardiac Doppler and Color  Doppler Indications:    CHF  History:        Patient has prior history of Echocardiogram examinations.                 Arrythmias:Atrial Fibrillation; Risk Factors:Hypertension,                 Diabetes and Dyslipidemia.  Sonographer:    Ross Ludwig RDCS (AE) Referring Phys: [id number] [facility]  Sonographer Comments: Technically difficult study due to poor echo windows, suboptimal apical window, suboptimal subcostal window and patient is morbidly obese. Image acquisition challenging due to patient body habitus. IMPRESSIONS  1. Left ventricular ejection fraction, by estimation, is 55 to 60%. The left ventricle has normal function. The left ventricle has no regional wall motion abnormalities. There is moderate concentric left ventricular hypertrophy. Left ventricular diastolic parameters were normal.  2. Right ventricular systolic function is normal. The right ventricular size is normal. There is normal pulmonary artery systolic pressure.  3. Left atrial size was mildly dilated.  4. The mitral valve is normal in structure. No evidence of mitral valve regurgitation. No evidence of mitral stenosis.  5. The aortic valve is tricuspid. Aortic valve regurgitation is not visualized. No aortic stenosis is present.  6. The inferior vena cava is normal in size with greater than 50% respiratory variability, suggesting right atrial pressure of 3 mmHg. FINDINGS  Left Ventricle: Left ventricular ejection fraction, by estimation, is 55 to 60%. The left ventricle has normal function. The left ventricle has no regional wall motion abnormalities. The left ventricular internal cavity size was normal in size. There is  moderate concentric left ventricular hypertrophy. Left ventricular diastolic parameters were normal. Indeterminate filling pressures. Right Ventricle: The right ventricular size is normal. No increase in right ventricular wall thickness. Right ventricular systolic function is normal. There is normal pulmonary artery  systolic pressure. The tricuspid regurgitant velocity is 2.86 m/s, and  with an assumed right atrial pressure of 3 mmHg, the estimated right ventricular systolic pressure is 35.7 mmHg. Left Atrium: Left atrial size was mildly dilated. Right Atrium: Right atrial size was normal in size. Pericardium: There is no evidence of pericardial effusion. Mitral Valve: The mitral valve is normal in structure. No evidence of mitral valve regurgitation. No evidence of mitral valve stenosis.  MV peak gradient, 5.1 mmHg. The mean mitral valve gradient is 2.0 mmHg. Tricuspid Valve: The tricuspid valve is normal in structure. Tricuspid valve regurgitation is trivial. No evidence of tricuspid stenosis. Aortic Valve: The aortic valve is tricuspid. Aortic valve regurgitation is not visualized. No aortic stenosis is present. Aortic valve mean gradient measures 3.0 mmHg. Aortic valve peak gradient measures 4.9 mmHg. Aortic valve area, by VTI measures 2.60 cm. Pulmonic Valve: The pulmonic valve was normal in structure. Pulmonic valve regurgitation is not visualized. No evidence of pulmonic stenosis. Aorta: The aortic root is normal in size and structure. Venous: The inferior vena cava is normal in size with greater than 50% respiratory variability, suggesting right atrial pressure of 3 mmHg. IAS/Shunts: No atrial level shunt detected by color flow Doppler.  LEFT VENTRICLE PLAX 2D LVIDd:         4.40 cm  Diastology LVIDs:         3.10 cm  LV e' medial:    8.92 cm/s LV PW:         1.60 cm  LV E/e' medial:  9.8 LV IVS:        1.57 cm  LV e' lateral:   9.14 cm/s LVOT diam:     2.10 cm  LV E/e' lateral: 9.6 LV SV:         68 LV SV Index:   29 LVOT Area:     3.46 cm  RIGHT VENTRICLE RV S prime:     15.60 cm/s TAPSE (M-mode): 2.9 cm LEFT ATRIUM             Index LA diam:        4.70 cm 1.98 cm/m LA Vol (A2C):   59.4 ml 25.00 ml/m LA Vol (A4C):   78.8 ml 33.17 ml/m LA Biplane Vol: 75.4 ml 31.74 ml/m  AORTIC VALVE AV Area (Vmax):    2.74 cm  AV Area (Vmean):   2.63 cm AV Area (VTI):     2.60 cm AV Vmax:           111.00 cm/s AV Vmean:          75.800 cm/s AV VTI:            0.261 m AV Peak Grad:      4.9 mmHg AV Mean Grad:      3.0 mmHg LVOT Vmax:         87.70 cm/s LVOT Vmean:        57.600 cm/s LVOT VTI:          0.196 m LVOT/AV VTI ratio: 0.75  AORTA Ao Root diam: 3.60 cm Ao Asc diam:  3.60 cm MITRAL VALVE               TRICUSPID VALVE MV Area (PHT): 3.21 cm    TR Peak grad:   32.7 mmHg MV Peak grad:  5.1 mmHg    TR Vmax:        286.00 cm/s MV Mean grad:  2.0 mmHg MV Vmax:       1.13 m/s    SHUNTS MV Vmean:      62.2 cm/s   Systemic VTI:  0.20 m MV Decel Time: 236 msec    Systemic Diam: 2.10 cm MV E velocity: 87.30 cm/s MV A velocity: 76.60 cm/s MV E/A ratio:  1.14 [name] MD Electronically signed by [name] MD Signature Date/Time: 05/27/2020/5:30:41 PM    Final    CT Angio Abd/Pel w/ and/or  w/o  Result Date: 05/27/2020 CLINICAL DATA:  Acute lower GI bleeding, left lower quadrant pain EXAM: CT ANGIOGRAPHY ABDOMEN AND PELVIS WITH CONTRAST AND WITHOUT CONTRAST TECHNIQUE: Multidetector CT imaging of the abdomen and pelvis was performed using the standard protocol during bolus administration of intravenous contrast. Multiplanar reconstructed images and MIPs were obtained and reviewed to evaluate the vascular anatomy. CONTRAST:  153mL OMNIPAQUE IOHEXOL 350 MG/ML SOLN COMPARISON:  None. FINDINGS: VASCULAR Aorta: Calcific atherosclerosis most pronounced of the infrarenal aorta with mild infrarenal aortic luminal narrowing but no occlusive process. Negative for aneurysm. No dissection appreciated or retroperitoneal hemorrhage. No evidence of rupture. Celiac: Atherosclerotic origin but remains patent including its branches. No active arterial bleeding within the celiac vascular territory. SMA: Atherosclerotic origin but remains patent including its branches. No active bleeding in the SMA territory. Renals: Atherosclerotic origins with  mild ostial narrowing suspected but both renal arteries remain patent. IMA: Calcified narrowed origin but appears to remain patent off the distal aorta including its branches. No active lower GI bleeding in the IMA territory. Inflow: Atherosclerotic and tortuous iliac vasculature without inflow disease or occlusion. No acute pelvic arterial vascular finding. Proximal Outflow: Atherosclerotic changes of the femoral vasculature but all remain patent. Veins: No veno-occlusive process. Review of the MIP images confirms the above findings. NON-VASCULAR Lower chest: Bibasilar atelectasis/hypoventilatory changes. Left lower lobe bleb noted. Mild cardiomegaly. No pericardial or pleural effusion. Hepatobiliary: No focal liver abnormality is seen. No gallstones, gallbladder wall thickening, or biliary dilatation. Pancreas: Mild diffuse fatty replacement most pronounced of the head and uncinate process. No ductal dilatation or acute process. No surrounding free fluid or inflammation. Spleen: Normal in size without focal abnormality. Adrenals/Urinary Tract: Normal adrenal glands. No focal renal abnormality or obstruction pattern. No hydronephrosis. No upper urinary tract calculi. Ureters are symmetric and decompressed. No hydroureter or ureteral obstruction. Bladder demonstrates a right posterior diverticulum containing layering radiopaque gallstones. Diverticula measures 4 cm, image 76/series 12. Areas of bladder wall thickening and smaller diverticula noted on the left side. Bladder stones noted along the left bladder wall measuring up to 8 mm. These bladder findings appear to be chronic. Stomach/Bowel: Slight strandy edema about the duodenum proximally suggesting nonspecific duodenitis. Small duodenal diverticulum also noted with an air-fluid level. Negative for bowel obstruction, significant dilatation, ileus, or free air. No evidence of appendicitis. No acute colitis or diverticulitis. No free fluid, fluid collection,  hemorrhage, hematoma, abscess, or ascites. Lymphatic: No bulky adenopathy. Reproductive: Prostate calcifications noted. Lower pelvis is obscured by artifact from the hip replacements. Other: Intact abdominal wall.  No hernia. Musculoskeletal: Hip arthroplasty is noted bilaterally creating artifact through the lower pelvis. Degenerative changes of the spine. No acute osseous finding. IMPRESSION: VASCULAR Negative for active arterial GI bleeding. No other acute finding by CTA. Aortoiliac atherosclerosis as detailed above. NON-VASCULAR Mild strandy edema and wall thickening of the duodenum, compatible with nonspecific duodenitis. This can be seen with peptic ulcer disease. No evidence of free air or perforation. Consider correlation with upper endoscopy. Chronic bladder diverticula containing radiopaque stones. No acute obstructive uropathy or ureteral calculus. Electronically Signed   By: Jerilynn Mages.  Shick M.D.   On: 05/27/2020 15:18    ASSESMENT:   *  Melenic stool.  05/27/20 CTAP s/o duodenitis but no active bleed   05/28/2020 EGD: Esophageal changes suggesting short segment Barrett's.  2 cm HH, no Cameron's erosions.  Moderate gastritis.  Duodenal ulcers x 3 with pigmented material, this is source of dark, FOBT positive stool. Path pndg (  histo, H pylori).    *   Mild blood loss anemia.  Hgb stable after initial 1.5 gm drop.    *   Isolated episode of acute dysphagia post op after swallowing dry chicken.  No recurrence on soft, chopped diet.    *    A fib, chronic warfarin.  Therapeutic INR at admission.     PLAN   *   Advanced to carb mod/ HH solid diet.   *    Complete 12 weeks of Protonix 40 mg po bid. Hold warfarin for another 4 days, could resume on 06/03/2020  *   OK for discharge.  GI signing off.  ROV 2/14 at 10:30 w Dr Carlean Purl.   Repeat EGD ~ 12 weeks.       Azucena Freed  05/29/2020, 9:54 AM Phone [phone]

## 2020-05-29 NOTE — Progress Notes (Signed)
Nsg Discharge Note  Admit Date:  05/26/2020 Discharge date: 05/29/2020   Christopher Wade to be D/C'd Home per MD order.  AVS completed.  Copy for chart, and copy for patient signed, and dated. Patient/caregiver able to verbalize understanding.  Discharge Medication: Allergies as of 05/29/2020      Reactions   Amiodarone Other (See Comments)   Lung toxicity   Bee Venom Other (See Comments)   Leg went numb      Medication List    TAKE these medications   acetaminophen 325 MG tablet Commonly known as: TYLENOL Take 650 mg by mouth every 6 (six) hours as needed for mild pain.   allopurinol 300 MG tablet Commonly known as: ZYLOPRIM Take      1 tablet       Daily        to Prevent Gout   amLODipine 5 MG tablet Commonly known as: NORVASC Take      1 &1/2 tablets (7.5 mg)        Daily     For BP & Heart What changed:   how much to take  how to take this  when to take this  additional instructions   atorvastatin 80 MG tablet Commonly known as: LIPITOR TAKE 1 TABLET(80 MG) BY MOUTH DAILY AT 6 PM What changed: See the new instructions.   B COMPLEX PO Take 1 tablet by mouth daily.   carvedilol 12.5 MG tablet Commonly known as: COREG Take 2 tablet 2 x  /day for BP & Heart What changed:   how much to take  how to take this  when to take this  additional instructions   cholecalciferol 1000 units tablet Commonly known as: VITAMIN D Take 2,000 Units by mouth daily.   colchicine 0.6 MG tablet Take 1 tablet daily to prevent Gout Attacks What changed:   how much to take  how to take this  when to take this  reasons to take this  additional instructions   Fish Oil 1200 MG Caps Take 1,200 mg by mouth daily.   levothyroxine 100 MCG tablet Commonly known as: SYNTHROID Take      1 tablet       Daily       on an empty stomach with only water for 30 minutes & no Antacid meds, Calcium or Magnesium for 4 hours & avoid Biotin What changed:   how much to take  how to  take this  when to take this  additional instructions   loratadine 10 MG tablet Commonly known as: CLARITIN Take 10 mg by mouth daily.   magnesium gluconate 500 MG tablet Commonly known as: MAGONATE Take 1,000 mg by mouth daily.   metFORMIN 500 MG 24 hr tablet Commonly known as: GLUCOPHAGE-XR Take     2 tablets     2 x /day     with Meals       for Diabetes What changed:   how much to take  how to take this  when to take this  additional instructions   olmesartan 40 MG tablet Commonly known as: BENICAR Take 1 tablet Daily for BP What changed:   how much to take  how to take this  when to take this  additional instructions   pantoprazole 40 MG tablet Commonly known as: PROTONIX Take 1 tablet (40 mg total) by mouth 2 (two) times daily before a meal. Take 2 times daily for 90 days then once daily indefinetly  vitamin C with rose hips 500 MG tablet Take 500 mg by mouth daily.   warfarin 5 MG tablet Commonly known as: COUMADIN Take as directed. If you are unsure how to take this medication, talk to your nurse or doctor. Original instructions: Hold until 06/03/20 Resume 06/03/20 What changed: See the new instructions.   zinc gluconate 50 MG tablet Take 50 mg by mouth daily.            Durable Medical Equipment  (From admission, onward)         Start     Ordered   05/29/20 1455  For home use only DME 3 n 1  Once        05/29/20 1455          Discharge Assessment: Vitals:   05/29/20 0759 05/29/20 1223  BP: (!) 155/63 (!) 166/74  Pulse: 63 66  Resp:  16  Temp:  97.8 F (36.6 C)  SpO2:  (!) 87%   Skin clean, dry and intact without evidence of skin break down, no evidence of skin tears noted. IV catheter discontinued intact. Site without signs and symptoms of complications - no redness or edema noted at insertion site, patient denies c/o pain - only slight tenderness at site.  Dressing with slight pressure applied.  D/c  Instructions-Education: Discharge instructions given to patient/family with verbalized understanding. D/c education completed with patient/family including follow up instructions, medication list, d/c activities limitations if indicated, with other d/c instructions as indicated by MD - patient able to verbalize understanding, all questions fully answered. Patient instructed to return to ED, call 911, or call MD for any changes in condition.  Patient escorted via WC, and D/C home via private auto.  Boykin Nearing, RN 05/29/2020 3:01 PM

## 2020-05-29 NOTE — Discharge Summary (Signed)
Physician Discharge Summary  DONYEA BESEDA [id number] DOB: 1950/09/09 DOA: 05/26/2020  PCP: [name], MD  Admit date: 05/26/2020 Discharge date: 05/29/2020  Time spent: 45 minutes  Recommendations for Outpatient Follow-up:  1. Follow up with Dr Carlean Purl GI as scheduled 07/08/20 2. See PCP 1-2 weeks for evaluation of Hemeglobin 3. Hold coumadin until 06/03/20 4. Consider EGD 12 weeks to assess healing   Discharge Diagnoses:  Active Problems:   Essential hypertension   Type II diabetes mellitus (HCC)   Hypothyroidism   Upper GI bleed   AKI (acute kidney injury) ([place])   Lower extremity edema   Duodenal ulcer   Gastritis and gastroduodenitis   Discharge Condition: stable  Diet recommendation: carb modified  Filed Weights   05/28/20 0412 05/28/20 0740 05/29/20 0434  Weight: 110.1 kg 99.8 kg 110.2 kg    History of present illness:  Christopher Wade is a 70 y.o. male with medical history significant of hypertension, hyperlipidemia, persistent atrial fibrillation on Coumadin, diabetes mellitus type 2, and hypothyroidism presents with complaints of Jasmynn Pfalzgraf stools for the previous 5 days.  He is unsure what onset symptoms.  Reports having several loose stools per day that were all Kadia Abaya in appearance.  Bowel movements were occurring so frequently that he sometimes is having accidents and started wearing adult briefs.  Associated symptoms included crampy abdominal pain, headache, lightheadedness, insomnia, lower extremity swelling, and decreased appetite.  He feels that he has lost probably 8 pounds but has not recently checked.  The abdominal pain is severe and located mostly in the left lower quadrant of his abdomen.  Denies having any fever, cough, nausea, vomiting, loss of consciousness, or chest pain.  He has never had any symptoms like this previously in the past.  Patient does not use any NSAIDs on, but is on Coumadin.  Last reports taking Coumadin 2 days ago. Last colonoscopy was in  2019 with Dr. Carlean Purl revealed sessile polyp which was removed and internal hemorrhoids.  Hospital Course:   UpperGI bleed: Acute. Patient reports several days of dark tarry stoolsand left lower quadrant pain.  -[facility] consulted. 1 /08/2020 EGD: Esophageal changes suggesting short segment Barrett's.  2 cm HH, no Cameron's erosions.  Moderate gastritis.  Duodenal ulcers x 3 with pigmented material, this is source of dark, FOBT positive stool. Path pndg (histo, H pylori).  Discharging with pantoprazole 40 mg BID-AC x 12 weeks then daily thereafter. Has follow up 07/08/20. No ibuprofen, naproxen, or other non-steroidal anti-inflammatory drugs. Holding warfarin until 06/03/20.  Acute kidney injury: -resolved  Lower extremity edema:Acute.Patient with at least 2+ pitting edema in the bilateral lower extremities. Denies any complaints of shortness of breath. BNP 86.8.   Paroxysmalatrial fibrillationwith subtherapeutic INR: Patient reports prior history of ablation and cardioversion. INR subtherapeutic at 1.8 and last reports taking Coumadin 2 days ago. Appears to be in sinus rhythm currently. -Held Coumadin due to bleeding- restart in restart 06/03/20  Essential hypertension: Home blood pressure medications includeamlodipine 7.5 mg daily, Coreg 25 mg twice daily. Controlled. Continue Coreg and amlodipine  Diabetes mellitus type 2, uncontrolled: Last hemoglobin A1c noted to be 7.3 on 05/07/2020.Home medications include Metformin 100 mg twice daily.   Hypothyroidism: Last TSH was 3.6 on 05/07/2020. -Continue levothyroxine  Elevated AST: Acute. AST 69 patient, but previously within normal limits. Suspect likely reactive in nature. -Outpatient follow-up  Hypokalemia/hypomagnesemia -replete   Procedures: EGD Consultations:  [facility]  Discharge Exam: Vitals:   05/29/20 0759 05/29/20 1223  BP: [name])  155/63 (!) 166/74  Pulse: 63 66  Resp:  16  Temp:  97.8 F  (36.6 C)  SpO2:  (!) 87%    General: awake alert no acute distress. Eating lunch Cardiovascular: rrr no MGR no LE edema Respiratory: normal effort BS clear bilaterally no wheeze Abdomen: obese soft +BS throughout non-tender   Discharge Instructions   Discharge Instructions    Call MD for:  persistant dizziness or light-headedness   Complete by: As directed    Call MD for:  persistant nausea and vomiting   Complete by: As directed    Diet - low sodium heart healthy   Complete by: As directed    Diet Carb Modified   Complete by: As directed    Discharge instructions   Complete by: As directed    Take medications as prescribed Follow up with Dr Carlean Purl as scheduled   Increase activity slowly   Complete by: As directed      Allergies as of 05/29/2020      Reactions   Amiodarone Other (See Comments)   Lung toxicity   Bee Venom Other (See Comments)   Leg went numb      Medication List    TAKE these medications   acetaminophen 325 MG tablet Commonly known as: TYLENOL Take 650 mg by mouth every 6 (six) hours as needed for mild pain.   allopurinol 300 MG tablet Commonly known as: ZYLOPRIM Take      1 tablet       Daily        to Prevent Gout   amLODipine 5 MG tablet Commonly known as: NORVASC Take      1 &1/2 tablets (7.5 mg)        Daily     For BP & Heart What changed:   how much to take  how to take this  when to take this  additional instructions   atorvastatin 80 MG tablet Commonly known as: LIPITOR TAKE 1 TABLET(80 MG) BY MOUTH DAILY AT 6 PM What changed: See the new instructions.   B COMPLEX PO Take 1 tablet by mouth daily.   carvedilol 12.5 MG tablet Commonly known as: COREG Take 2 tablet 2 x  /day for BP & Heart What changed:   how much to take  how to take this  when to take this  additional instructions   cholecalciferol 1000 units tablet Commonly known as: VITAMIN D Take 2,000 Units by mouth daily.   colchicine 0.6 MG  tablet Take 1 tablet daily to prevent Gout Attacks What changed:   how much to take  how to take this  when to take this  reasons to take this  additional instructions   Fish Oil 1200 MG Caps Take 1,200 mg by mouth daily.   levothyroxine 100 MCG tablet Commonly known as: SYNTHROID Take      1 tablet       Daily       on an empty stomach with only water for 30 minutes & no Antacid meds, Calcium or Magnesium for 4 hours & avoid Biotin What changed:   how much to take  how to take this  when to take this  additional instructions   loratadine 10 MG tablet Commonly known as: CLARITIN Take 10 mg by mouth daily.   magnesium gluconate 500 MG tablet Commonly known as: MAGONATE Take 1,000 mg by mouth daily.   metFORMIN 500 MG 24 hr tablet Commonly known as: GLUCOPHAGE-XR Take  2 tablets     2 x /day     with Meals       for Diabetes What changed:   how much to take  how to take this  when to take this  additional instructions   olmesartan 40 MG tablet Commonly known as: BENICAR Take 1 tablet Daily for BP What changed:   how much to take  how to take this  when to take this  additional instructions   pantoprazole 40 MG tablet Commonly known as: PROTONIX Take 1 tablet (40 mg total) by mouth 2 (two) times daily before a meal. Take 2 times daily for 90 days then once daily indefinetly   vitamin C with rose hips 500 MG tablet Take 500 mg by mouth daily.   warfarin 5 MG tablet Commonly known as: COUMADIN Take as directed. If you are unsure how to take this medication, talk to your nurse or doctor. Original instructions: Hold until 06/03/20 Resume 06/03/20 What changed: See the new instructions.   zinc gluconate 50 MG tablet Take 50 mg by mouth daily.      Allergies  Allergen Reactions  . Amiodarone Other (See Comments)    Lung toxicity  . Bee Venom Other (See Comments)    Leg went numb    Follow-up Information    Iva Boop, MD Follow  up on 07/08/2020.   Specialty: Gastroenterology Why: 10:30 follow up of ulcers, bleeding.   Contact information: [address] [place] [address] [phone]                The results of significant diagnostics from this hospitalization (including imaging, microbiology, ancillary and laboratory) are listed below for reference.    Significant Diagnostic Studies: DG ABD ACUTE 2+V W 1V CHEST  Result Date: 05/27/2020 CLINICAL DATA:  Generalized abdominal pain X 1 day.  GI bleed. EXAM: DG ABDOMEN ACUTE WITH 1 VIEW CHEST COMPARISON:  CT abdomen/pelvis 05/27/2020, chest x-ray 07/24/2018 FINDINGS: There is no evidence of dilated bowel loops or free intraperitoneal air. Excreted intravenous contrast noted to opacify the urinary bladder lumen and known right diverticula as well as partially opacifying bilateral renal collecting systems. No radiopaque calculi or other significant radiographic abnormality is seen. The heart size and mediastinal contours are unchanged. Bibasilar compressive changes. Left base bullous disease better appreciated on CT abdomen pelvis 05/27/2020. No focal consolidation. No pulmonary edema. No pleural effusion. No pneumothorax. No acute osseous abnormality. Multilevel degenerative changes of the spine. Bilateral acromioclavicular degenerative changes. IMPRESSION: 1. Nonobstructive bowel gas pattern. Please see separately dictated CT abdomen/pelvis 05/27/2020 for further details. 2. No acute cardiopulmonary disease. Electronically Signed   By: Tish Frederickson M.D.   On: 05/27/2020 15:54   ECHOCARDIOGRAM COMPLETE  Result Date: 05/27/2020    ECHOCARDIOGRAM REPORT   Patient Name:   OTONIEL MYHAND Date of Exam: 05/27/2020 Medical Rec #:  [id number]     Height:       72.0 in Accession #:    [id number]    Weight:       258.4 lb Date of Birth:  1950/10/20    BSA:          2.376 m Patient Age:    69 years      BP:           170/87 mmHg Patient Gender: M             HR:  71  bpm. Exam Location:  Inpatient Procedure: 2D Echo, Cardiac Doppler and Color Doppler Indications:    CHF  History:        Patient has prior history of Echocardiogram examinations.                 Arrythmias:Atrial Fibrillation; Risk Factors:Hypertension,                 Diabetes and Dyslipidemia.  Sonographer:    Clayton Lefort RDCS (AE) Referring Phys: [id number] [facility]  Sonographer Comments: Technically difficult study due to poor echo windows, suboptimal apical window, suboptimal subcostal window and patient is morbidly obese. Image acquisition challenging due to patient body habitus. IMPRESSIONS  1. Left ventricular ejection fraction, by estimation, is 55 to 60%. The left ventricle has normal function. The left ventricle has no regional wall motion abnormalities. There is moderate concentric left ventricular hypertrophy. Left ventricular diastolic parameters were normal.  2. Right ventricular systolic function is normal. The right ventricular size is normal. There is normal pulmonary artery systolic pressure.  3. Left atrial size was mildly dilated.  4. The mitral valve is normal in structure. No evidence of mitral valve regurgitation. No evidence of mitral stenosis.  5. The aortic valve is tricuspid. Aortic valve regurgitation is not visualized. No aortic stenosis is present.  6. The inferior vena cava is normal in size with greater than 50% respiratory variability, suggesting right atrial pressure of 3 mmHg. FINDINGS  Left Ventricle: Left ventricular ejection fraction, by estimation, is 55 to 60%. The left ventricle has normal function. The left ventricle has no regional wall motion abnormalities. The left ventricular internal cavity size was normal in size. There is  moderate concentric left ventricular hypertrophy. Left ventricular diastolic parameters were normal. Indeterminate filling pressures. Right Ventricle: The right ventricular size is normal. No increase in right ventricular wall thickness. Right  ventricular systolic function is normal. There is normal pulmonary artery systolic pressure. The tricuspid regurgitant velocity is 2.86 m/s, and  with an assumed right atrial pressure of 3 mmHg, the estimated right ventricular systolic pressure is 123XX123 mmHg. Left Atrium: Left atrial size was mildly dilated. Right Atrium: Right atrial size was normal in size. Pericardium: There is no evidence of pericardial effusion. Mitral Valve: The mitral valve is normal in structure. No evidence of mitral valve regurgitation. No evidence of mitral valve stenosis. MV peak gradient, 5.1 mmHg. The mean mitral valve gradient is 2.0 mmHg. Tricuspid Valve: The tricuspid valve is normal in structure. Tricuspid valve regurgitation is trivial. No evidence of tricuspid stenosis. Aortic Valve: The aortic valve is tricuspid. Aortic valve regurgitation is not visualized. No aortic stenosis is present. Aortic valve mean gradient measures 3.0 mmHg. Aortic valve peak gradient measures 4.9 mmHg. Aortic valve area, by VTI measures 2.60 cm. Pulmonic Valve: The pulmonic valve was normal in structure. Pulmonic valve regurgitation is not visualized. No evidence of pulmonic stenosis. Aorta: The aortic root is normal in size and structure. Venous: The inferior vena cava is normal in size with greater than 50% respiratory variability, suggesting right atrial pressure of 3 mmHg. IAS/Shunts: No atrial level shunt detected by color flow Doppler.  LEFT VENTRICLE PLAX 2D LVIDd:         4.40 cm  Diastology LVIDs:         3.10 cm  LV e' medial:    8.92 cm/s LV PW:         1.60 cm  LV E/e' medial:  9.8 LV  IVS:        1.57 cm  LV e' lateral:   9.14 cm/s LVOT diam:     2.10 cm  LV E/e' lateral: 9.6 LV SV:         68 LV SV Index:   29 LVOT Area:     3.46 cm  RIGHT VENTRICLE RV S prime:     15.60 cm/s TAPSE (M-mode): 2.9 cm LEFT ATRIUM             Index LA diam:        4.70 cm 1.98 cm/m LA Vol (A2C):   59.4 ml 25.00 ml/m LA Vol (A4C):   78.8 ml 33.17 ml/m LA  Biplane Vol: 75.4 ml 31.74 ml/m  AORTIC VALVE AV Area (Vmax):    2.74 cm AV Area (Vmean):   2.63 cm AV Area (VTI):     2.60 cm AV Vmax:           111.00 cm/s AV Vmean:          75.800 cm/s AV VTI:            0.261 m AV Peak Grad:      4.9 mmHg AV Mean Grad:      3.0 mmHg LVOT Vmax:         87.70 cm/s LVOT Vmean:        57.600 cm/s LVOT VTI:          0.196 m LVOT/AV VTI ratio: 0.75  AORTA Ao Root diam: 3.60 cm Ao Asc diam:  3.60 cm MITRAL VALVE               TRICUSPID VALVE MV Area (PHT): 3.21 cm    TR Peak grad:   32.7 mmHg MV Peak grad:  5.1 mmHg    TR Vmax:        286.00 cm/s MV Mean grad:  2.0 mmHg MV Vmax:       1.13 m/s    SHUNTS MV Vmean:      62.2 cm/s   Systemic VTI:  0.20 m MV Decel Time: 236 msec    Systemic Diam: 2.10 cm MV E velocity: 87.30 cm/s MV A velocity: 76.60 cm/s MV E/A ratio:  1.14 [name] MD Electronically signed by [name] MD Signature Date/Time: 05/27/2020/5:30:41 PM    Final    CT Angio Abd/Pel w/ and/or w/o  Result Date: 05/27/2020 CLINICAL DATA:  Acute lower GI bleeding, left lower quadrant pain EXAM: CT ANGIOGRAPHY ABDOMEN AND PELVIS WITH CONTRAST AND WITHOUT CONTRAST TECHNIQUE: Multidetector CT imaging of the abdomen and pelvis was performed using the standard protocol during bolus administration of intravenous contrast. Multiplanar reconstructed images and MIPs were obtained and reviewed to evaluate the vascular anatomy. CONTRAST:  169mL OMNIPAQUE IOHEXOL 350 MG/ML SOLN COMPARISON:  None. FINDINGS: VASCULAR Aorta: Calcific atherosclerosis most pronounced of the infrarenal aorta with mild infrarenal aortic luminal narrowing but no occlusive process. Negative for aneurysm. No dissection appreciated or retroperitoneal hemorrhage. No evidence of rupture. Celiac: Atherosclerotic origin but remains patent including its branches. No active arterial bleeding within the celiac vascular territory. SMA: Atherosclerotic origin but remains patent including its branches. No  active bleeding in the SMA territory. Renals: Atherosclerotic origins with mild ostial narrowing suspected but both renal arteries remain patent. IMA: Calcified narrowed origin but appears to remain patent off the distal aorta including its branches. No active lower GI bleeding in the IMA territory. Inflow: Atherosclerotic and tortuous iliac vasculature without inflow disease or  occlusion. No acute pelvic arterial vascular finding. Proximal Outflow: Atherosclerotic changes of the femoral vasculature but all remain patent. Veins: No veno-occlusive process. Review of the MIP images confirms the above findings. NON-VASCULAR Lower chest: Bibasilar atelectasis/hypoventilatory changes. Left lower lobe bleb noted. Mild cardiomegaly. No pericardial or pleural effusion. Hepatobiliary: No focal liver abnormality is seen. No gallstones, gallbladder wall thickening, or biliary dilatation. Pancreas: Mild diffuse fatty replacement most pronounced of the head and uncinate process. No ductal dilatation or acute process. No surrounding free fluid or inflammation. Spleen: Normal in size without focal abnormality. Adrenals/Urinary Tract: Normal adrenal glands. No focal renal abnormality or obstruction pattern. No hydronephrosis. No upper urinary tract calculi. Ureters are symmetric and decompressed. No hydroureter or ureteral obstruction. Bladder demonstrates a right posterior diverticulum containing layering radiopaque gallstones. Diverticula measures 4 cm, image 76/series 12. Areas of bladder wall thickening and smaller diverticula noted on the left side. Bladder stones noted along the left bladder wall measuring up to 8 mm. These bladder findings appear to be chronic. Stomach/Bowel: Slight strandy edema about the duodenum proximally suggesting nonspecific duodenitis. Small duodenal diverticulum also noted with an air-fluid level. Negative for bowel obstruction, significant dilatation, ileus, or free air. No evidence of  appendicitis. No acute colitis or diverticulitis. No free fluid, fluid collection, hemorrhage, hematoma, abscess, or ascites. Lymphatic: No bulky adenopathy. Reproductive: Prostate calcifications noted. Lower pelvis is obscured by artifact from the hip replacements. Other: Intact abdominal wall.  No hernia. Musculoskeletal: Hip arthroplasty is noted bilaterally creating artifact through the lower pelvis. Degenerative changes of the spine. No acute osseous finding. IMPRESSION: VASCULAR Negative for active arterial GI bleeding. No other acute finding by CTA. Aortoiliac atherosclerosis as detailed above. NON-VASCULAR Mild strandy edema and wall thickening of the duodenum, compatible with nonspecific duodenitis. This can be seen with peptic ulcer disease. No evidence of free air or perforation. Consider correlation with upper endoscopy. Chronic bladder diverticula containing radiopaque stones. No acute obstructive uropathy or ureteral calculus. Electronically Signed   By: Jerilynn Mages.  Shick M.D.   On: 05/27/2020 15:18    Microbiology: Recent Results (from the past 240 hour(s))  SARS CORONAVIRUS 2 (TAT 6-24 HRS) Nasopharyngeal Nasopharyngeal Swab     Status: None   Collection Time: 05/27/20  8:30 AM   Specimen: Nasopharyngeal Swab  Result Value Ref Range Status   SARS Coronavirus 2 NEGATIVE NEGATIVE Final    Comment: (NOTE) SARS-CoV-2 target nucleic acids are NOT DETECTED.  The SARS-CoV-2 RNA is generally detectable in upper and lower respiratory specimens during the acute phase of infection. Negative results do not preclude SARS-CoV-2 infection, do not rule out co-infections with other pathogens, and should not be used as the sole basis for treatment or other patient management decisions. Negative results must be combined with clinical observations, patient history, and epidemiological information. The expected result is Negative.  Fact Sheet for Patients: SugarRoll.be  Fact  Sheet for Healthcare Providers: https://www.woods-mathews.com/  This test is not yet approved or cleared by the [place] FDA and  has been authorized for detection and/or diagnosis of SARS-CoV-2 by FDA under an Emergency Use Authorization (EUA). This EUA will remain  in effect (meaning this test can be used) for the duration of the COVID-19 declaration under Se ction 564(b)(1) of the Act, 21 U.S.C. section 360bbb-3(b)(1), unless the authorization is terminated or revoked sooner.  Performed at [facility], [place] [address], [place], [place] [address]      Labs: Basic Metabolic Panel: Recent Labs  Lab 05/26/20  1558 05/27/20 1451 05/28/20 0210 05/29/20 0213  NA 140 137 139 139  K 3.8 3.2* 3.4* 3.5  CL 101  --  103 101  CO2 24  --  26 26  GLUCOSE 131*  --  126* 145*  BUN 32*  --  10 12  CREATININE 1.36*  --  0.90 0.94  CALCIUM 9.3  --  8.5* 8.8*  MG  --   --  1.5*  --    Liver Function Tests: Recent Labs  Lab 05/26/20 1558 05/28/20 0210  AST 69* 30  ALT 36 29  ALKPHOS 91 77  BILITOT 1.5* 1.3*  PROT 7.7 6.6  ALBUMIN 3.6 2.9*   No results for input(s): LIPASE, AMYLASE in the last 168 hours. No results for input(s): AMMONIA in the last 168 hours. CBC: Recent Labs  Lab 05/26/20 1558 05/27/20 1118 05/27/20 1424 05/27/20 1451 05/28/20 0210 05/28/20 1503 05/29/20 0213  WBC 10.5  --   --   --  7.8 8.5 8.0  HGB 14.0   < > 12.6* 12.6* 12.4* 13.2 12.6*  HCT 41.8   < > 38.9* 37.0* 36.1* 40.7 37.5*  MCV 98.1  --   --   --  96.0 98.3 97.4  PLT 277  --   --   --  251 273 238   < > = values in this interval not displayed.   Cardiac Enzymes: No results for input(s): CKTOTAL, CKMB, CKMBINDEX, TROPONINI in the last 168 hours. BNP: BNP (last 3 results) Recent Labs    05/27/20 0829  BNP 86.8    ProBNP (last 3 results) No results for input(s): PROBNP in the last 8760 hours.  CBG: Recent Labs  Lab 05/28/20 1107 05/28/20 1622 05/28/20 2142  05/29/20 0608 05/29/20 1107  GLUCAP 129* 196* 141* 146* 120*       Signed:  Radene Gunning NP [facility] 05/29/2020, 2:02 PM

## 2020-05-30 ENCOUNTER — Telehealth: Payer: Self-pay | Admitting: *Deleted

## 2020-05-30 NOTE — Telephone Encounter (Signed)
Called patient on 05/30/2020 , 12:22 PM in an attempt to reach the patient for a hospital follow up. Spoke  with the patient and states he is staying at his brother's home during his recovery.  Admit date: 05/26/20 Discharge: 05/29/20   He does not have any questions or concerns about medications from the hospital admission. The patient's medications were reviewed over the phone, they were counseled to bring in all current medications to the hospital follow up visit.   I advised the patient to call if any questions or concerns arise about the hospital admission or medications. Patient is currently holding his Warfarin and will restart on 06/03/2020.   Home health was started in the hospital. Patient declined PT and states he thinks he will be OK without it. All questions were answered and a follow up appointment was made. Patient has a hospital follow up visit on 06/10/2020 with Judd GaudierAshley Corbett, PA.  Prior to Admission medications   Medication Sig Start Date End Date Taking? Authorizing Provider  acetaminophen (TYLENOL) 325 MG tablet Take 650 mg by mouth every 6 (six) hours as needed for mild pain.    [provider]  allopurinol (ZYLOPRIM) 300 MG tablet Take      1 tablet       Daily        to Prevent Gout 04/09/20   Lucky CowboyMcKeown, William, MD  amLODipine (NORVASC) 5 MG tablet Take      1 &1/2 tablets (7.5 mg)        Daily     For BP & Heart Patient taking differently: Take 7.5 mg by mouth daily. 04/09/20   Lucky CowboyMcKeown, William, MD  Ascorbic Acid (VITAMIN C WITH ROSE HIPS) 500 MG tablet Take 500 mg by mouth daily.    [provider]  atorvastatin (LIPITOR) 80 MG tablet TAKE 1 TABLET(80 MG) BY MOUTH DAILY AT 6 PM Patient taking differently: Take 40 mg by mouth daily. 04/09/20   Quintella Reicherturner, Traci R, MD  B Complex Vitamins (B COMPLEX PO) Take 1 tablet by mouth daily.    [provider]  carvedilol (COREG) 12.5 MG tablet Take 2 tablet 2 x  /day for BP & Heart Patient taking differently:  Take 25 mg by mouth 2 (two) times daily with a meal. 10/26/19   Lucky CowboyMcKeown, William, MD  cholecalciferol (VITAMIN D) 1000 UNITS tablet Take 2,000 Units by mouth daily.    [provider]  colchicine 0.6 MG tablet Take 1 tablet daily to prevent Gout Attacks Patient taking differently: Take 0.6-1.2 mg by mouth daily as needed (flare up). 08/29/18   Lucky CowboyMcKeown, William, MD  levothyroxine (SYNTHROID) 100 MCG tablet Take      1 tablet       Daily       on an empty stomach with only water for 30 minutes & no Antacid meds, Calcium or Magnesium for 4 hours & avoid Biotin Patient taking differently: Take 100 mcg by mouth daily before breakfast. 04/09/20   Lucky CowboyMcKeown, William, MD  loratadine (CLARITIN) 10 MG tablet Take 10 mg by mouth daily.    [provider]  magnesium gluconate (MAGONATE) 500 MG tablet Take 1,000 mg by mouth daily.     [provider]  metFORMIN (GLUCOPHAGE-XR) 500 MG 24 hr tablet Take     2 tablets     2 x /day     with Meals       for Diabetes Patient taking differently: Take 1,000 mg by mouth 2 (two)  times daily. 04/09/20   [name], MD  olmesartan (BENICAR) 40 MG tablet Take 1 tablet Daily for BP Patient taking differently: Take 40 mg by mouth daily. 01/03/20   [name], MD  Omega-3 Fatty Acids (FISH OIL) 1200 MG CAPS Take 1,200 mg by mouth daily.    [provider]  pantoprazole (PROTONIX) 40 MG tablet Take 1 tablet (40 mg total) by mouth 2 (two) times daily before a meal. Take 2 times daily for 90 days then once daily indefinetly 05/29/20 08/27/20  Gwenyth Bender, NP  warfarin (COUMADIN) 5 MG tablet Hold until 06/03/20 Resume 06/03/20 05/29/20   Gwenyth Bender, NP  zinc gluconate 50 MG tablet Take 50 mg by mouth daily.    [provider]

## 2020-06-05 ENCOUNTER — Telehealth: Payer: Self-pay | Admitting: *Deleted

## 2020-06-05 NOTE — Telephone Encounter (Signed)
Returned call to patient regarding diarrhea. Patient states there is no red blood or black stool. Er Dr Melford Aase the patient can try Imodium, up to 12 tablets daily, or Pepto Bismol. Patient advised to go to ED if he see bright red blood.

## 2020-06-06 ENCOUNTER — Ambulatory Visit (INDEPENDENT_AMBULATORY_CARE_PROVIDER_SITE_OTHER): Payer: Medicare Other | Admitting: Pharmacist

## 2020-06-06 ENCOUNTER — Other Ambulatory Visit: Payer: Self-pay

## 2020-06-06 DIAGNOSIS — I4819 Other persistent atrial fibrillation: Secondary | ICD-10-CM

## 2020-06-06 DIAGNOSIS — Z5181 Encounter for therapeutic drug level monitoring: Secondary | ICD-10-CM | POA: Diagnosis not present

## 2020-06-06 LAB — POCT INR: INR: 1.1 — AB (ref 2.0–3.0)

## 2020-06-06 NOTE — Patient Instructions (Signed)
Description   Take 1.5 tablets today and then resume taking your normal Warfarin 1.5 tablets daily except 1 tablet on Sundays and Thursdays. Recheck INR in 1 week. Coumadin Clinic: [phone] Main number: [phone]

## 2020-06-07 NOTE — Progress Notes (Deleted)
Hospital follow up  Assessment and Plan: Hospital visit follow up for:   Diagnoses and all orders for this visit:  Upper GI bleed  Duodenal ulcer  Gastritis and gastroduodenitis     All medications were reviewed with patient and family and fully reconciled. All questions answered fully, and patient and family members were encouraged to call the office with any further questions or concerns. Discussed goal to avoid readmission related to this diagnosis.  There are no discontinued medications.  CAN NOT DO FOR BCBS REGULAR OR MEDICARE Over 40 minutes of exam, counseling, chart review, and complex, high/moderate level critical decision making was performed this visit.   Future Appointments  Date Time Provider [place]  06/10/2020  2:00 PM Liane Comber, NP [facility] None  06/17/2020  1:30 PM [facility]  07/08/2020 10:30 AM Gatha Mayer, MD [facility]  08/05/2020  3:30 PM Garnet Sierras, NP [facility] None  11/06/2020  3:00 PM [name], MD [facility] None     HPI 70 y.o.male presents for follow up for transition from recent hospitalization or SNIF stay. Admit date to the hospital was 05/26/20, patient was discharged from the hospital on 05/29/20 and our clinical staff contacted the office the day after discharge to set up a follow up appointment. The discharge summary, medications, and diagnostic test results were reviewed before meeting with the patient. The patient was admitted for:   Discharge Diagnoses:  Active Problems:   Essential hypertension   Type II diabetes mellitus (HCC)   Hypothyroidism   Upper GI bleed   AKI (acute kidney injury) ([place])   Lower extremity edema   Duodenal ulcer   Gastritis and gastroduodenitis  Recommendations for Outpatient Follow-up:  1. Follow up with Dr Carlean Purl GI as scheduled 07/08/20 2. See PCP 1-2 weeks for evaluation of Hemeglobin 3. Hold coumadin until  06/03/20 4. Consider EGD 12 weeks to assess healing   HPI and hospital course:  Christopher Wade a 70 y.o.malewith medical history significant ofpersistent atrial fibrillation on Coumadinpresented with complaints of loose black stools for the previous 5 days, 8 lb weight loss. No NSAID use. He had + homoccult. Last colonoscopy was in 2019 with Dr. Carlean Purl revealed sessile polyp which was removed and internal hemorrhoids.  Hospital Course:   UpperGI bleed: Acute. Patient reports several days of dark tarry stoolsand left lower quadrant pain.  -[facility] consulted. 05/28/2020 CHY:IFOYDXAJOI changes suggesting short segment Barrett's. 2 cm HH, no Cameron's erosions. Moderate gastritis. Duodenal ulcers x 3with pigmented material, this is source of dark, FOBT positive stool. Path pndg (histo, H pylori). Discharging with pantoprazole 40 mg BID-AC x 12 weeks then daily thereafter. Has follow up 07/08/20. No ibuprofen, naproxen, or other non-steroidal anti-inflammatory drugs. Holding warfarin until 06/03/20.  Acute kidney injury: -resolved   Paroxysmalatrial fibrillationwith subtherapeutic INR: Patient reports prior history of ablation and cardioversion. NSR while admitted -Held Coumadin due to bleeding- restart in restart 06/03/20  Elevated AST: Acute. AST 69 patient, but previously within normal limits. Suspect likely reactive in nature. -Outpatient follow-up  Hypokalemia/hypomagnesemia -replete   Procedures: EGD Consultations:  [facility]  FINAL MICROSCOPIC DIAGNOSIS:   A. STOMACH, BODY AND ANTRUM, BIOPSY:  - Reactive gastropathy  - No H. pylori or intestinal metaplasia identified  - See comment   B. ESOPHAGUS, DISTAL, BIOPSY:  - Squamous and glandular epithelium with acute and chronic inflammation  - Intestinal metaplasia present  - No dysplasia or malignancy identified  - See comment  Hospital follow up 06/10/2020:  There were no vitals  taken for this visit.  ***    CBC Latest Ref Rng & Units 05/29/2020 05/28/2020 05/28/2020  WBC 4.0 - 10.5 K/uL 8.0 8.5 7.8  Hemoglobin 13.0 - 17.0 g/dL 12.6(L) 13.2 12.4(L)  Hematocrit 39.0 - 52.0 % 37.5(L) 40.7 36.1(L)  Platelets 150 - 400 K/uL 238 273 251       Home health {ACTION; IS/IS [id number]} involved.   Images while in the hospital: DG ABD ACUTE 2+V W 1V CHEST  Result Date: 05/27/2020 CLINICAL DATA:  Generalized abdominal pain X 1 day.  GI bleed. EXAM: DG ABDOMEN ACUTE WITH 1 VIEW CHEST COMPARISON:  CT abdomen/pelvis 05/27/2020, chest x-ray 07/24/2018 FINDINGS: There is no evidence of dilated bowel loops or free intraperitoneal air. Excreted intravenous contrast noted to opacify the urinary bladder lumen and known right diverticula as well as partially opacifying bilateral renal collecting systems. No radiopaque calculi or other significant radiographic abnormality is seen. The heart size and mediastinal contours are unchanged. Bibasilar compressive changes. Left base bullous disease better appreciated on CT abdomen pelvis 05/27/2020. No focal consolidation. No pulmonary edema. No pleural effusion. No pneumothorax. No acute osseous abnormality. Multilevel degenerative changes of the spine. Bilateral acromioclavicular degenerative changes. IMPRESSION: 1. Nonobstructive bowel gas pattern. Please see separately dictated CT abdomen/pelvis 05/27/2020 for further details. 2. No acute cardiopulmonary disease. Electronically Signed   By: Iven Finn M.D.   On: 05/27/2020 15:54   ECHOCARDIOGRAM COMPLETE  Result Date: 05/27/2020    ECHOCARDIOGRAM REPORT   [name] Date of Exam: 05/27/2020 Medical Rec #:  [id number]     Height:       72.0 in Accession #:    [id number]    Weight:       258.4 lb Date of Birth:  July 29, 1950    BSA:          2.376 m Patient Age:    5 years      BP:           170/87 mmHg Patient Gender: M             HR:           71 bpm. Exam Location:  Inpatient  Procedure: 2D Echo, Cardiac Doppler and Color Doppler Indications:    CHF  History:        Patient has prior history of Echocardiogram examinations.                 Arrythmias:Atrial Fibrillation; Risk Factors:Hypertension,                 Diabetes and Dyslipidemia.  Sonographer:    Clayton Lefort RDCS (AE) Referring Phys: [id number] [facility]  Sonographer Comments: Technically difficult study due to poor echo windows, suboptimal apical window, suboptimal subcostal window and patient is morbidly obese. Image acquisition challenging due to patient body habitus. IMPRESSIONS  1. Left ventricular ejection fraction, by estimation, is 55 to 60%. The left ventricle has normal function. The left ventricle has no regional wall motion abnormalities. There is moderate concentric left ventricular hypertrophy. Left ventricular diastolic parameters were normal.  2. Right ventricular systolic function is normal. The right ventricular size is normal. There is normal pulmonary artery systolic pressure.  3. Left atrial size was mildly dilated.  4. The mitral valve is normal in structure. No evidence of mitral valve regurgitation. No evidence of mitral stenosis.  5. The aortic valve is tricuspid.  Aortic valve regurgitation is not visualized. No aortic stenosis is present.  6. The inferior vena cava is normal in size with greater than 50% respiratory variability, suggesting right atrial pressure of 3 mmHg. FINDINGS  Left Ventricle: Left ventricular ejection fraction, by estimation, is 55 to 60%. The left ventricle has normal function. The left ventricle has no regional wall motion abnormalities. The left ventricular internal cavity size was normal in size. There is  moderate concentric left ventricular hypertrophy. Left ventricular diastolic parameters were normal. Indeterminate filling pressures. Right Ventricle: The right ventricular size is normal. No increase in right ventricular wall thickness. Right ventricular systolic function  is normal. There is normal pulmonary artery systolic pressure. The tricuspid regurgitant velocity is 2.86 m/s, and  with an assumed right atrial pressure of 3 mmHg, the estimated right ventricular systolic pressure is 45.4 mmHg. Left Atrium: Left atrial size was mildly dilated. Right Atrium: Right atrial size was normal in size. Pericardium: There is no evidence of pericardial effusion. Mitral Valve: The mitral valve is normal in structure. No evidence of mitral valve regurgitation. No evidence of mitral valve stenosis. MV peak gradient, 5.1 mmHg. The mean mitral valve gradient is 2.0 mmHg. Tricuspid Valve: The tricuspid valve is normal in structure. Tricuspid valve regurgitation is trivial. No evidence of tricuspid stenosis. Aortic Valve: The aortic valve is tricuspid. Aortic valve regurgitation is not visualized. No aortic stenosis is present. Aortic valve mean gradient measures 3.0 mmHg. Aortic valve peak gradient measures 4.9 mmHg. Aortic valve area, by VTI measures 2.60 cm. Pulmonic Valve: The pulmonic valve was normal in structure. Pulmonic valve regurgitation is not visualized. No evidence of pulmonic stenosis. Aorta: The aortic root is normal in size and structure. Venous: The inferior vena cava is normal in size with greater than 50% respiratory variability, suggesting right atrial pressure of 3 mmHg. IAS/Shunts: No atrial level shunt detected by color flow Doppler.  LEFT VENTRICLE PLAX 2D LVIDd:         4.40 cm  Diastology LVIDs:         3.10 cm  LV e' medial:    8.92 cm/s LV PW:         1.60 cm  LV E/e' medial:  9.8 LV IVS:        1.57 cm  LV e' lateral:   9.14 cm/s LVOT diam:     2.10 cm  LV E/e' lateral: 9.6 LV SV:         68 LV SV Index:   29 LVOT Area:     3.46 cm  RIGHT VENTRICLE RV S prime:     15.60 cm/s TAPSE (M-mode): 2.9 cm LEFT ATRIUM             Index LA diam:        4.70 cm 1.98 cm/m LA Vol (A2C):   59.4 ml 25.00 ml/m LA Vol (A4C):   78.8 ml 33.17 ml/m LA Biplane Vol: 75.4 ml 31.74 ml/m   AORTIC VALVE AV Area (Vmax):    2.74 cm AV Area (Vmean):   2.63 cm AV Area (VTI):     2.60 cm AV Vmax:           111.00 cm/s AV Vmean:          75.800 cm/s AV VTI:            0.261 m AV Peak Grad:      4.9 mmHg AV Mean Grad:      3.0 mmHg LVOT Vmax:  87.70 cm/s LVOT Vmean:        57.600 cm/s LVOT VTI:          0.196 m LVOT/AV VTI ratio: 0.75  AORTA Ao Root diam: 3.60 cm Ao Asc diam:  3.60 cm MITRAL VALVE               TRICUSPID VALVE MV Area (PHT): 3.21 cm    TR Peak grad:   32.7 mmHg MV Peak grad:  5.1 mmHg    TR Vmax:        286.00 cm/s MV Mean grad:  2.0 mmHg MV Vmax:       1.13 m/s    SHUNTS MV Vmean:      62.2 cm/s   Systemic VTI:  0.20 m MV Decel Time: 236 msec    Systemic Diam: 2.10 cm MV E velocity: 87.30 cm/s MV A velocity: 76.60 cm/s MV E/A ratio:  1.14 [name] MD Electronically signed by [name] MD Signature Date/Time: 05/27/2020/5:30:41 PM    Final    CT Angio Abd/Pel w/ and/or w/o  Result Date: 05/27/2020 CLINICAL DATA:  Acute lower GI bleeding, left lower quadrant pain EXAM: CT ANGIOGRAPHY ABDOMEN AND PELVIS WITH CONTRAST AND WITHOUT CONTRAST TECHNIQUE: Multidetector CT imaging of the abdomen and pelvis was performed using the standard protocol during bolus administration of intravenous contrast. Multiplanar reconstructed images and MIPs were obtained and reviewed to evaluate the vascular anatomy. CONTRAST:  123mL OMNIPAQUE IOHEXOL 350 MG/ML SOLN COMPARISON:  None. FINDINGS: VASCULAR Aorta: Calcific atherosclerosis most pronounced of the infrarenal aorta with mild infrarenal aortic luminal narrowing but no occlusive process. Negative for aneurysm. No dissection appreciated or retroperitoneal hemorrhage. No evidence of rupture. Celiac: Atherosclerotic origin but remains patent including its branches. No active arterial bleeding within the celiac vascular territory. SMA: Atherosclerotic origin but remains patent including its branches. No active bleeding in the SMA  territory. Renals: Atherosclerotic origins with mild ostial narrowing suspected but both renal arteries remain patent. IMA: Calcified narrowed origin but appears to remain patent off the distal aorta including its branches. No active lower GI bleeding in the IMA territory. Inflow: Atherosclerotic and tortuous iliac vasculature without inflow disease or occlusion. No acute pelvic arterial vascular finding. Proximal Outflow: Atherosclerotic changes of the femoral vasculature but all remain patent. Veins: No veno-occlusive process. Review of the MIP images confirms the above findings. NON-VASCULAR Lower chest: Bibasilar atelectasis/hypoventilatory changes. Left lower lobe bleb noted. Mild cardiomegaly. No pericardial or pleural effusion. Hepatobiliary: No focal liver abnormality is seen. No gallstones, gallbladder wall thickening, or biliary dilatation. Pancreas: Mild diffuse fatty replacement most pronounced of the head and uncinate process. No ductal dilatation or acute process. No surrounding free fluid or inflammation. Spleen: Normal in size without focal abnormality. Adrenals/Urinary Tract: Normal adrenal glands. No focal renal abnormality or obstruction pattern. No hydronephrosis. No upper urinary tract calculi. Ureters are symmetric and decompressed. No hydroureter or ureteral obstruction. Bladder demonstrates a right posterior diverticulum containing layering radiopaque gallstones. Diverticula measures 4 cm, image 76/series 12. Areas of bladder wall thickening and smaller diverticula noted on the left side. Bladder stones noted along the left bladder wall measuring up to 8 mm. These bladder findings appear to be chronic. Stomach/Bowel: Slight strandy edema about the duodenum proximally suggesting nonspecific duodenitis. Small duodenal diverticulum also noted with an air-fluid level. Negative for bowel obstruction, significant dilatation, ileus, or free air. No evidence of appendicitis. No acute colitis or  diverticulitis. No free fluid, fluid collection, hemorrhage, hematoma, abscess, or  ascites. Lymphatic: No bulky adenopathy. Reproductive: Prostate calcifications noted. Lower pelvis is obscured by artifact from the hip replacements. Other: Intact abdominal wall.  No hernia. Musculoskeletal: Hip arthroplasty is noted bilaterally creating artifact through the lower pelvis. Degenerative changes of the spine. No acute osseous finding. IMPRESSION: VASCULAR Negative for active arterial GI bleeding. No other acute finding by CTA. Aortoiliac atherosclerosis as detailed above. NON-VASCULAR Mild strandy edema and wall thickening of the duodenum, compatible with nonspecific duodenitis. This can be seen with peptic ulcer disease. No evidence of free air or perforation. Consider correlation with upper endoscopy. Chronic bladder diverticula containing radiopaque stones. No acute obstructive uropathy or ureteral calculus. Electronically Signed   By: Jerilynn Mages.  Shick M.D.   On: 05/27/2020 15:18     Current Outpatient Medications (Endocrine & Metabolic):  .  levothyroxine (SYNTHROID) 100 MCG tablet, Take      1 tablet       Daily       on an empty stomach with only water for 30 minutes & no Antacid meds, Calcium or Magnesium for 4 hours & avoid Biotin (Patient taking differently: Take 100 mcg by mouth daily before breakfast.) .  metFORMIN (GLUCOPHAGE-XR) 500 MG 24 hr tablet, Take     2 tablets     2 x /day     with Meals       for Diabetes (Patient taking differently: Take 1,000 mg by mouth 2 (two) times daily.)  Current Outpatient Medications (Cardiovascular):  .  amLODipine (NORVASC) 5 MG tablet, Take      1 &1/2 tablets (7.5 mg)        Daily     For BP & Heart (Patient taking differently: Take 7.5 mg by mouth daily.) .  atorvastatin (LIPITOR) 80 MG tablet, TAKE 1 TABLET(80 MG) BY MOUTH DAILY AT 6 PM (Patient taking differently: Take 40 mg by mouth daily.) .  carvedilol (COREG) 12.5 MG tablet, Take 2 tablet 2 x  /day for BP &  Heart (Patient taking differently: Take 25 mg by mouth 2 (two) times daily with a meal.) .  olmesartan (BENICAR) 40 MG tablet, Take 1 tablet Daily for BP (Patient taking differently: Take 40 mg by mouth daily.)  Current Outpatient Medications (Respiratory):  .  loratadine (CLARITIN) 10 MG tablet, Take 10 mg by mouth daily.  Current Outpatient Medications (Analgesics):  .  acetaminophen (TYLENOL) 325 MG tablet, Take 650 mg by mouth every 6 (six) hours as needed for mild pain. [name]  allopurinol (ZYLOPRIM) 300 MG tablet, Take      1 tablet       Daily        to Prevent Gout .  colchicine 0.6 MG tablet, Take 1 tablet daily to prevent Gout Attacks (Patient taking differently: Take 0.6-1.2 mg by mouth daily as needed (flare up).)  Current Outpatient Medications (Hematological):  .  warfarin (COUMADIN) 5 MG tablet, Hold until 06/03/20 Resume 06/03/20  Current Outpatient Medications (Other):  [name]  Ascorbic Acid (VITAMIN C WITH ROSE HIPS) 500 MG tablet, Take 500 mg by mouth daily. .  B Complex Vitamins (B COMPLEX PO), Take 1 tablet by mouth daily. .  cholecalciferol (VITAMIN D) 1000 UNITS tablet, Take 2,000 Units by mouth daily. .  magnesium gluconate (MAGONATE) 500 MG tablet, Take 1,000 mg by mouth daily.  .  Omega-3 Fatty Acids (FISH OIL) 1200 MG CAPS, Take 1,200 mg by mouth daily. .  pantoprazole (PROTONIX) 40 MG tablet, Take 1 tablet (40 mg total) by  mouth 2 (two) times daily before a meal. Take 2 times daily for 90 days then once daily indefinetly .  zinc gluconate 50 MG tablet, Take 50 mg by mouth daily.  Past Medical History:  Diagnosis Date  . Arthritis    "joints get stiff" (06/11/2016)  . Bee sting allergy   . Bee sting allergy   . CAD (coronary artery disease), native coronary artery 02/12/2016   cath showing 50% LM, 40% distal LAD, 70% ost to prox LCs, 40% mid LAD and 95% mid to dist RCA s/p PCI with DES x 2 and stage PCI of the LCx/OM.  [name] Carotid artery stenosis    1-39% bilateral by  dopplers 08/2017  . History of gout   . Hx of adenomatous colonic polyps 04/10/2008  . Hyperlipidemia   . Hypertension   . Hypothyroidism   . OSA (obstructive sleep apnea)    "tried CPAP; couldn't do it" (06/11/2016)  . Persistent atrial fibrillation ([place])    s/p DCCV 03/2015 with reversion back to atrial fibrillation, loaded with Amio and s/p DCCV to NSR 11/2015.  He is now s/p afib albation.  He had amio lung toxicity and amio was stopped.  . Thyroid disease   . Type II diabetes mellitus ([place])   . Vitamin D deficiency      Allergies  Allergen Reactions  . Amiodarone Other (See Comments)    Lung toxicity  . Bee Venom Other (See Comments)    Leg went numb    ROS: all negative except above.   Physical Exam: There were no vitals filed for this visit. There were no vitals taken for this visit. General Appearance: Well nourished, in no apparent distress. Eyes: PERRLA, EOMs, conjunctiva no swelling or erythema Sinuses: No Frontal/maxillary tenderness ENT/Mouth: Ext aud canals clear, TMs without erythema, bulging. No erythema, swelling, or exudate on post pharynx.  Tonsils not swollen or erythematous. Hearing normal.  Neck: Supple, thyroid normal.  Respiratory: Respiratory effort normal, BS equal bilaterally without rales, rhonchi, wheezing or stridor.  Cardio: RRR with no MRGs. Brisk peripheral pulses without edema.  Abdomen: Soft, + BS.  Non tender, no guarding, rebound, hernias, masses. Lymphatics: Non tender without lymphadenopathy.  Musculoskeletal: Full ROM, 5/5 strength, normal gait.  Skin: Warm, dry without rashes, lesions, ecchymosis.  Neuro: Cranial nerves intact. Normal muscle tone, no cerebellar symptoms. Sensation intact.  Psych: Awake and oriented X 3, normal affect, Insight and Judgment appropriate.     Izora Ribas, NP 3:18 PM [facility]

## 2020-06-10 ENCOUNTER — Inpatient Hospital Stay: Payer: Medicare Other | Admitting: Adult Health

## 2020-06-10 DIAGNOSIS — K269 Duodenal ulcer, unspecified as acute or chronic, without hemorrhage or perforation: Secondary | ICD-10-CM

## 2020-06-10 DIAGNOSIS — K2970 Gastritis, unspecified, without bleeding: Secondary | ICD-10-CM

## 2020-06-10 DIAGNOSIS — K922 Gastrointestinal hemorrhage, unspecified: Secondary | ICD-10-CM

## 2020-06-12 ENCOUNTER — Other Ambulatory Visit: Payer: Self-pay | Admitting: *Deleted

## 2020-06-12 MED ORDER — CARVEDILOL 12.5 MG PO TABS: ORAL_TABLET | ORAL | 0 refills | Status: DC

## 2020-06-17 ENCOUNTER — Ambulatory Visit (INDEPENDENT_AMBULATORY_CARE_PROVIDER_SITE_OTHER): Payer: Medicare Other | Admitting: *Deleted

## 2020-06-17 ENCOUNTER — Encounter (INDEPENDENT_AMBULATORY_CARE_PROVIDER_SITE_OTHER): Payer: Self-pay

## 2020-06-17 ENCOUNTER — Other Ambulatory Visit: Payer: Self-pay

## 2020-06-17 DIAGNOSIS — I4819 Other persistent atrial fibrillation: Secondary | ICD-10-CM

## 2020-06-17 DIAGNOSIS — Z5181 Encounter for therapeutic drug level monitoring: Secondary | ICD-10-CM

## 2020-06-17 LAB — POCT INR: INR: 2.5 (ref 2.0–3.0)

## 2020-06-17 NOTE — Patient Instructions (Signed)
Description   Continue taking Warfarin 1.5 tablets daily except 1 tablet on Sundays and Thursdays. Recheck INR in 3 weeks per pt request. Coumadin Clinic: [phone] Main number: [phone]

## 2020-06-19 ENCOUNTER — Other Ambulatory Visit: Payer: Self-pay

## 2020-06-19 ENCOUNTER — Ambulatory Visit (INDEPENDENT_AMBULATORY_CARE_PROVIDER_SITE_OTHER): Payer: Medicare Other | Admitting: Adult Health

## 2020-06-19 ENCOUNTER — Encounter: Payer: Self-pay | Admitting: Adult Health

## 2020-06-19 VITALS — BP 160/82 | HR 73 | Temp 97.5°F | Wt 249.0 lb

## 2020-06-19 DIAGNOSIS — K2270 Barrett's esophagus without dysplasia: Secondary | ICD-10-CM

## 2020-06-19 DIAGNOSIS — I10 Essential (primary) hypertension: Secondary | ICD-10-CM | POA: Diagnosis not present

## 2020-06-19 DIAGNOSIS — K922 Gastrointestinal hemorrhage, unspecified: Secondary | ICD-10-CM

## 2020-06-19 DIAGNOSIS — K269 Duodenal ulcer, unspecified as acute or chronic, without hemorrhage or perforation: Secondary | ICD-10-CM

## 2020-06-19 DIAGNOSIS — K2970 Gastritis, unspecified, without bleeding: Secondary | ICD-10-CM | POA: Diagnosis not present

## 2020-06-19 DIAGNOSIS — K2990 Gastroduodenitis, unspecified, without bleeding: Secondary | ICD-10-CM

## 2020-06-19 MED ORDER — AMLODIPINE BESYLATE 10 MG PO TABS: ORAL_TABLET | ORAL | 1 refills | Status: DC

## 2020-06-19 NOTE — Patient Instructions (Addendum)
Goals    . Blood Pressure < 130/80    . Exercise 150 min/wk Moderate Activity    . Weight (lb) < 225 lb (102.1 kg)        Recommend increasing amlodipine to 10 mg daily -   Continue coreg 25 mg twice daily   Move olmesartan 40 mg to the evening    Please monitor blood pressure twice daily, different times of the day and keep a log   Please call our office to schedule a follow up if persistently running 140/80+ in 2 weeks despite the medication change   HYPERTENSION INFORMATION  Monitor your blood pressure at home, please keep a record and bring that in with you to your next office visit.   Go to the ER if any CP, SOB, nausea, dizziness, severe HA, changes vision/speech  Testing/Procedures: HOW TO TAKE YOUR BLOOD PRESSURE:  Rest 5 minutes before taking your blood pressure.  Don't smoke or drink caffeinated beverages for at least 30 minutes before.  Take your blood pressure before (not after) you eat.  Sit comfortably with your back supported and both feet on the floor (don't cross your legs).  Elevate your arm to heart level on a table or a desk.  Use the proper sized cuff. It should fit smoothly and snugly around your bare upper arm. There should be enough room to slip a fingertip under the cuff. The bottom edge of the cuff should be 1 inch above the crease of the elbow.  Your most recent BP: BP: (!) 160/82   Take your medications faithfully as instructed. Maintain a healthy weight. Get at least 150 minutes of aerobic exercise per week. Minimize salt intake. Minimize alcohol intake  DASH Eating Plan DASH stands for "Dietary Approaches to Stop Hypertension." The DASH eating plan is a healthy eating plan that has been shown to reduce high blood pressure (hypertension). Additional health benefits may include reducing the risk of type 2 diabetes mellitus, heart disease, and stroke. The DASH eating plan may also help with weight loss. WHAT DO I NEED TO KNOW ABOUT THE  DASH EATING PLAN? For the DASH eating plan, you will follow these general guidelines:  Choose foods with a percent daily value for sodium of less than 5% (as listed on the food label).  Use salt-free seasonings or herbs instead of table salt or sea salt.  Check with your health care provider or pharmacist before using salt substitutes.  Eat lower-sodium products, often labeled as "lower sodium" or "no salt added."  Eat fresh foods.  Eat more vegetables, fruits, and low-fat dairy products.  Choose whole grains. Look for the word "whole" as the first word in the ingredient list.  Choose fish and skinless chicken or [place] more often than red meat. Limit fish, poultry, and meat to 6 oz (170 g) each day.  Limit sweets, desserts, sugars, and sugary drinks.  Choose heart-healthy fats.  Limit cheese to 1 oz (28 g) per day.  Eat more home-cooked food and less restaurant, buffet, and fast food.  Limit fried foods.  Cook foods using methods other than frying.  Limit canned vegetables. If you do use them, rinse them well to decrease the sodium.  When eating at a restaurant, ask that your food be prepared with less salt, or no salt if possible. WHAT FOODS CAN I EAT? Seek help from a dietitian for individual calorie needs. Grains Whole grain or whole wheat bread. Brown rice. Whole grain or whole wheat pasta.  Quinoa, bulgur, and whole grain cereals. Low-sodium cereals. Corn or whole wheat flour tortillas. Whole grain cornbread. Whole grain crackers. Low-sodium crackers. Vegetables Fresh or frozen vegetables (raw, steamed, roasted, or grilled). Low-sodium or reduced-sodium tomato and vegetable juices. Low-sodium or reduced-sodium tomato sauce and paste. Low-sodium or reduced-sodium canned vegetables.  Fruits All fresh, canned (in natural juice), or frozen fruits. Meat and Other Protein Products Ground beef (85% or leaner), grass-fed beef, or beef trimmed of fat. Skinless chicken or  [place]. Ground chicken or [place]. Pork trimmed of fat. All fish and seafood. Eggs. Dried beans, peas, or lentils. Unsalted nuts and seeds. Unsalted canned beans. Dairy Low-fat dairy products, such as skim or 1% milk, 2% or reduced-fat cheeses, low-fat ricotta or cottage cheese, or plain low-fat yogurt. Low-sodium or reduced-sodium cheeses. Fats and Oils Tub margarines without trans fats. Light or reduced-fat mayonnaise and salad dressings (reduced sodium). Avocado. Safflower, olive, or canola oils. Natural peanut or almond butter. Other Unsalted popcorn and pretzels. The items listed above may not be a complete list of recommended foods or beverages. Contact your dietitian for more options. WHAT FOODS ARE NOT RECOMMENDED? Grains White bread. White pasta. White rice. Refined cornbread. Bagels and croissants. Crackers that contain trans fat. Vegetables Creamed or fried vegetables. Vegetables in a cheese sauce. Regular canned vegetables. Regular canned tomato sauce and paste. Regular tomato and vegetable juices. Fruits Dried fruits. Canned fruit in light or heavy syrup. Fruit juice. Meat and Other Protein Products Fatty cuts of meat. Ribs, chicken wings, bacon, sausage, bologna, salami, chitterlings, fatback, hot dogs, bratwurst, and packaged luncheon meats. Salted nuts and seeds. Canned beans with salt. Dairy Whole or 2% milk, cream, half-and-half, and cream cheese. Whole-fat or sweetened yogurt. Full-fat cheeses or blue cheese. Nondairy creamers and whipped toppings. Processed cheese, cheese spreads, or cheese curds. Condiments Onion and garlic salt, seasoned salt, table salt, and sea salt. Canned and packaged gravies. Worcestershire sauce. Tartar sauce. Barbecue sauce. Teriyaki sauce. Soy sauce, including reduced sodium. Steak sauce. Fish sauce. Oyster sauce. Cocktail sauce. Horseradish. Ketchup and mustard. Meat flavorings and tenderizers. Bouillon cubes. Hot sauce. Tabasco sauce. Marinades.  Taco seasonings. Relishes. Fats and Oils Butter, stick margarine, lard, shortening, ghee, and bacon fat. Coconut, palm kernel, or palm oils. Regular salad dressings. Other Pickles and olives. Salted popcorn and pretzels. The items listed above may not be a complete list of foods and beverages to avoid. Contact your dietitian for more information. WHERE CAN I FIND MORE INFORMATION? National [employer], [employer], [employer] Institute: travelstabl[employer].com Document Released: 04/30/2011 Document Revised: 09/25/2013 Document Reviewed: 03/15/2013 [facility] Patient Information 2015 [place], [place]. This information is not intended to replace advice given to you by your health care provider. Make sure you discuss any questions you have with your health care provider.       Barrett's Esophagus  Barrett's esophagus occurs when the tissue that lines the esophagus changes or becomes damaged. The esophagus is the tube that carries food from the throat to the stomach. With Barrett's esophagus, the cells that line the esophagus are replaced by cells that are similar to the lining of the intestines (intestinal metaplasia). Barrett's esophagus itself may not cause any symptoms. However, many people who have Barrett's esophagus also have gastroesophageal reflux disease (GERD), which may cause symptoms such as heartburn. Over time, a few people with this condition may develop cancer of the esophagus. Treatment may include medicines, procedures to destroy the abnormal cells, or surgery. What are the causes? The exact cause of this  condition is not known. In some cases, the condition develops from damage to the lining of the esophagus caused by gastroesophageal reflux disease (GERD). GERD occurs when stomach acids flow up from the stomach into the esophagus. Frequent symptoms of GERD may cause intestinal metaplasia or cause cell changes (dysplasia). What increases the risk? You are more likely  to develop this condition if you:  Have GERD.  Are male.  Are of European descent.  Are obese.  Are older than 50.  Have a hiatal hernia. This is a condition in which part of your stomach bulges into your chest.  Smoke. What are the signs or symptoms? People with Barrett's esophagus often have no symptoms. However, many people with this condition also have GERD. Symptoms of GERD may include:  Heartburn.  Difficulty swallowing.  Dry cough. How is this diagnosed? This condition may be diagnosed based on:  Results of an upper gastrointestinal endoscopy. For this exam, a thin, flexible tube with a light and a camera on the end (endoscope) is passed down your esophagus. Your health care provider can view the inside of your esophagus during this procedure.  Results of a biopsy. For this procedure, several tissue samples are removed (biopsy) from your esophagus to look at under a microscope. They are then checked for intestinal metaplasia or dysplasia. How is this treated? Treatment for this condition may include:  Medicines (proton pump inhibitors, or PPIs) to decrease or stop GERD.  Periodic endoscopic exams to make sure that cancer is not developing.  A procedure or surgery for dysplasia. This may include: ? Removal or destruction of abnormal cells. ? Removal of part of the esophagus. Follow these instructions at home: Eating and drinking  Eat more fruits and vegetables.  Avoid fatty foods.  Eat small, frequent meals instead of large meals.  Avoid foods that cause heartburn. These foods include: ? Coffee and alcoholic drinks. ? Tomatoes and foods made with tomatoes. ? Greasy or spicy foods. ? Chocolate and peppermint.  Do not drink alcohol. General instructions  Take over-the-counter and prescription medicines only as told by your health care provider.  Do not use any products that contain nicotine or tobacco, such as cigarettes, e-cigarettes, and chewing  tobacco. If you need help quitting, ask your health care provider.  If you are being treated for GERD, make sure you take medicines and follow all instructions as told by your health care provider.  Keep all follow-up visits as told by your health care provider. This is important. Contact a health care provider if:  You have heartburn or GERD symptoms.  You have difficulty swallowing. Get help right away if:  You have chest pain.  You are unable to swallow.  You vomit blood or material that looks like coffee grounds.  Your stool (feces) is bright red or dark. These symptoms may represent a serious problem that is an emergency. Do not wait to see if the symptoms will go away. Get medical help right away. Call your local emergency services (911 in the [place] Do not drive yourself to the hospital. Summary  Barrett's esophagus occurs when the tissue that lines the esophagus changes or becomes damaged.  Barrett's esophagus may be diagnosed with an upper gastrointestinal endoscopy and a biopsy.  Treatment may include medicines, procedures to remove abnormal cells, or surgery.  Follow your health care provider's instructions about what to eat and drink, what medicines to take, and when to call for help. This information is not intended to replace  advice given to you by your health care provider. Make sure you discuss any questions you have with your health care provider. Document Revised: 07/29/2019 Document Reviewed: 07/29/2019 [employer] Patient Education  [place].

## 2020-06-19 NOTE — Progress Notes (Signed)
Hospital follow up  Assessment and Plan: Hospital visit follow up for:   Christopher Wade was seen today for hospitalization follow-up.  Diagnoses and all orders for this visit:  Upper GI bleed Duodenal ulcer Gastritis and gastroduodenitis Avoid NSAIDs, alcohol, GERD foods and lifestyle reviewed Continue pantoprazone 40 mg BID as per discharge x 12 weeks then once daily indefinitely due to short seg distal Barrett's Reports melena is resolving Follow up if any worsening sx Follow up GI as scheduled -     CBC with Differential/Platelet  Barrett's esophagus without dysplasia Continue PPI indefinitely; discussed no current dysplasia but likely will monitor for trend Follow up Dr. Carlean Purl as scheduled, anticipate follow up EGD  Essential hypertension Persistently above goal;  Maximize current therapy; increase amlodipine to 10 mg  Continue coreg 25 mg BID and olmesartan; move ARB to PM Check BP BID, rotating checks, keep log for trend Follow up in 10-14 days  Schedule follow up in office if persistently 140/80+ Monitor blood pressure at home; call if consistently over 130/80 Continue DASH diet.   Reminder to go to the ER if any CP, SOB, nausea, dizziness, severe HA, changes vision/speech, left arm numbness and tingling and jaw pain. -     CBC with Differential/Platelet -     COMPLETE METABOLIC PANEL WITH GFR -     amLODipine (NORVASC) 10 MG tablet; Take 1 tab (10 mg) Daily For BP & Heart  All medications were reviewed with patient and family and fully reconciled. All questions answered fully, and patient and family members were encouraged to call the office with any further questions or concerns. Discussed goal to avoid readmission related to this diagnosis.  Medications Discontinued During This Encounter  Medication Reason  . amLODipine (NORVASC) 5 MG tablet     CAN NOT DO FOR BCBS REGULAR OR MEDICARE Over 40 minutes of exam, counseling, chart review, and complex, high/moderate level  critical decision making was performed this visit.   Future Appointments  Date Time Provider [place]  07/08/2020 10:30 AM Gatha Mayer, MD [facility] [place]  07/08/2020  1:30 PM [facility]  08/05/2020  3:30 PM Garnet Sierras, NP [facility] None  11/06/2020  3:00 PM [name], MD [facility] None     HPI 70 y.o.male presents for follow up for transition from recent hospitalization or SNIF stay. Admit date to the hospital was 05/26/20, patient was discharged from the hospital on 05/29/20 and our clinical staff contacted the office the day after discharge to set up a follow up appointment. The discharge summary, medications, and diagnostic test results were reviewed before meeting with the patient. The patient was admitted for:   Recommendations for Outpatient Follow-up:  1. Follow up with Dr Carlean Purl GI as scheduled 07/08/20 2. See PCP 1-2 weeks for evaluation of Hemeglobin 3. Hold coumadin until 06/03/20 4. Consider EGD 12 weeks to assess healing  Discharge Diagnoses:  Active Problems:   Essential hypertension   Type II diabetes mellitus (HCC)   Hypothyroidism   Upper GI bleed   AKI (acute kidney injury) ([place])   Lower extremity edema   Duodenal ulcer   Gastritis and gastroduodenitis   History of present illness:  Christopher Garrabrant Isleyis a 70 y.o.malewith medical history significant ofhypertension, persistent atrial fibrillation on Coumadin, diabetes mellitus type 2,andhypothyroidismpresented to ED with complaints of black stools for the previous 5 days and LLQ abdominal pain. Reported having several loose stools per day that were all black in appearance. Bowel movements were  occurring so frequently that he sometimes reported having accidents and started wearing adult briefs. Associated symptoms included crampy abdominal pain, headache, lightheadedness, insomnia, lower extremity swelling, and decreased appetite. The abdominal  painwas reportedly severe and located mostly in the left lower quadrant of his abdomen. Denied having any fever, cough, nausea, vomiting, loss of consciousness, or chest pain.No hx of similar, denied NSAID use.. Last colonoscopy was in 2019 with Dr. Carlean Purl revealed sessile polyp which was removed and internal hemorrhoids.  Hospital Course:  UpperGI bleed: Acute. Patient reports several days of dark tarry stoolsand left lower quadrant pain.  -[facility] consulted. 05/28/2020 [id number] changes suggesting short segment Barrett's. 2 cm HH, no Cameron's erosions. Moderate gastritis. Duodenal ulcers x 3with pigmented material, this is source of dark, FOBT positive stool. Path pndg (histo, H pylori).  Discharging with pantoprazole 40 mg BID-AC x 12 weeks then daily thereafter. Has follow up 07/08/20. No ibuprofen, naproxen, or other non-steroidal anti-inflammatory drugs. Holding warfarin until 06/03/20.  Paroxysmalatrial fibrillationwith subtherapeutic INR: Patient reports prior history of ablation and cardioversion. INR subtherapeutic at 1.8 and last reports taking Coumadin 2 days ago. Sinus rhythm while admitted.  -Held Coumadin due to bleeding- planned to restart 06/03/20  Elevated AST: Acute. AST 69 patient, but previously within normal limits. Suspect likely reactive in nature. -Outpatient follow-up  Hypokalemia/hypomagnesemia -replete  Procedures: EGD Consultations:  [facility]   06/19/2020 hospital follow up:   BP (!) 160/82   Pulse 73   Temp (!) 97.5 F (36.4 C)   Wt 249 lb (112.9 kg)   SpO2 96%   BMI 33.77 kg/m   70 y.o. male with htn and [name] on coumadin here for delayed hospital follow up for recent hospitalization for GI bleed with ulcers, gastritis and also found distal short segment Barrett's. Pathology showed acute and chronic inflammatory changes, neg H. Pylori, intestinal metaplasia without dysplasia or malignancy. He restarted coumadin on  06/03/2020 as was recommended at discharge via coumadin clinic, last INR was 06/17/2020 at 2.5 with follow up planned 07/08/2020.   He has been taking pantoprazole 40 mg BID per discharge recommendations. Denies recurrent pain. Does endorse intermittent dark stools but notes this seems to be improving, less frequent. He has follow up with Dr. Carlean Purl 07/08/2020 and discussing follow up EGD to confirm resolution. Last CBC showed very mild anemia only, hgb 12.6, hct 37.5.   His blood pressure has not been controlled at home (130-150s/80s), today their BP is BP: (!) 160/82 On reivew note has been trending persistently above goal since 03/2020. He has CAD, [name], followed by cardiology [name]. He is currently taking amlodipine 7.5 mg AM, coreg 25 mg BID, olmesartan 40 mg AM.    He does not workout. He denies chest pain, shortness of breath, dizziness.   Lab Results  Component Value Date   INR 2.5 06/17/2020   INR 1.1 (A) 06/06/2020   INR 1.8 (H) 05/27/2020   CBC Latest Ref Rng & Units 05/29/2020 05/28/2020 05/28/2020  WBC 4.0 - 10.5 K/uL 8.0 8.5 7.8  Hemoglobin 13.0 - 17.0 g/dL 12.6(L) 13.2 12.4(L)  Hematocrit 39.0 - 52.0 % 37.5(L) 40.7 36.1(L)  Platelets 150 - 400 K/uL 238 273 251   Lab Results  Component Value Date   NA 139 05/29/2020   K 3.5 05/29/2020   CL 101 05/29/2020   CO2 26 05/29/2020   GLUCOSE 145 (H) 05/29/2020   BUN 12 05/29/2020   CREATININE 0.94 05/29/2020   CALCIUM 8.8 (L) 05/29/2020  GFRAA 100 05/07/2020   GFRNONAA >60 05/29/2020   Lab Results  Component Value Date   ALT 29 05/28/2020   AST 30 05/28/2020   ALKPHOS 77 05/28/2020   BILITOT 1.3 (H) 05/28/2020    Home health is not involved.   Images while in the hospital: DG ABD ACUTE 2+V W 1V CHEST  Result Date: 05/27/2020 CLINICAL DATA:  Generalized abdominal pain X 1 day.  GI bleed. EXAM: DG ABDOMEN ACUTE WITH 1 VIEW CHEST COMPARISON:  CT abdomen/pelvis 05/27/2020, chest x-ray 07/24/2018 FINDINGS: There is no  evidence of dilated bowel loops or free intraperitoneal air. Excreted intravenous contrast noted to opacify the urinary bladder lumen and known right diverticula as well as partially opacifying bilateral renal collecting systems. No radiopaque calculi or other significant radiographic abnormality is seen. The heart size and mediastinal contours are unchanged. Bibasilar compressive changes. Left base bullous disease better appreciated on CT abdomen pelvis 05/27/2020. No focal consolidation. No pulmonary edema. No pleural effusion. No pneumothorax. No acute osseous abnormality. Multilevel degenerative changes of the spine. Bilateral acromioclavicular degenerative changes. IMPRESSION: 1. Nonobstructive bowel gas pattern. Please see separately dictated CT abdomen/pelvis 05/27/2020 for further details. 2. No acute cardiopulmonary disease. Electronically Signed   By: Iven Finn M.D.   On: 05/27/2020 15:54   ECHOCARDIOGRAM COMPLETE  Result Date: 05/27/2020    ECHOCARDIOGRAM REPORT   [name] Date of Exam: 05/27/2020 Medical Rec #:  [id number]     Height:       72.0 in Accession #:    [id number]    Weight:       258.4 lb Date of Birth:  [birth year]    BSA:          2.376 m Patient Age:    17 years      BP:           170/87 mmHg Patient Gender: M             HR:           71 bpm. Exam Location:  Inpatient Procedure: 2D Echo, Cardiac Doppler and Color Doppler Indications:    CHF  History:        Patient has prior history of Echocardiogram examinations.                 Arrythmias:Atrial Fibrillation; Risk Factors:Hypertension,                 Diabetes and Dyslipidemia.  Sonographer:    Clayton Lefort RDCS (AE) Referring Phys: [id number] [facility] A SM[facility]  Sonographer Comments: Technically difficult study due to poor echo windows, suboptimal apical window, suboptimal subcostal window and patient is morbidly obese. Image acquisition challenging due to patient body habitus. IMPRESSIONS  1. Left ventricular  ejection fraction, by estimation, is 55 to 60%. The left ventricle has normal function. The left ventricle has no regional wall motion abnormalities. There is moderate concentric left ventricular hypertrophy. Left ventricular diastolic parameters were normal.  2. Right ventricular systolic function is normal. The right ventricular size is normal. There is normal pulmonary artery systolic pressure.  3. Left atrial size was mildly dilated.  4. The mitral valve is normal in structure. No evidence of mitral valve regurgitation. No evidence of mitral stenosis.  5. The aortic valve is tricuspid. Aortic valve regurgitation is not visualized. No aortic stenosis is present.  6. The inferior vena cava is normal in size with greater than 50% respiratory variability, suggesting right atrial  pressure of 3 mmHg. FINDINGS  Left Ventricle: Left ventricular ejection fraction, by estimation, is 55 to 60%. The left ventricle has normal function. The left ventricle has no regional wall motion abnormalities. The left ventricular internal cavity size was normal in size. There is  moderate concentric left ventricular hypertrophy. Left ventricular diastolic parameters were normal. Indeterminate filling pressures. Right Ventricle: The right ventricular size is normal. No increase in right ventricular wall thickness. Right ventricular systolic function is normal. There is normal pulmonary artery systolic pressure. The tricuspid regurgitant velocity is 2.86 m/s, and  with an assumed right atrial pressure of 3 mmHg, the estimated right ventricular systolic pressure is 123XX123 mmHg. Left Atrium: Left atrial size was mildly dilated. Right Atrium: Right atrial size was normal in size. Pericardium: There is no evidence of pericardial effusion. Mitral Valve: The mitral valve is normal in structure. No evidence of mitral valve regurgitation. No evidence of mitral valve stenosis. MV peak gradient, 5.1 mmHg. The mean mitral valve gradient is 2.0 mmHg.  Tricuspid Valve: The tricuspid valve is normal in structure. Tricuspid valve regurgitation is trivial. No evidence of tricuspid stenosis. Aortic Valve: The aortic valve is tricuspid. Aortic valve regurgitation is not visualized. No aortic stenosis is present. Aortic valve mean gradient measures 3.0 mmHg. Aortic valve peak gradient measures 4.9 mmHg. Aortic valve area, by VTI measures 2.60 cm. Pulmonic Valve: The pulmonic valve was normal in structure. Pulmonic valve regurgitation is not visualized. No evidence of pulmonic stenosis. Aorta: The aortic root is normal in size and structure. Venous: The inferior vena cava is normal in size with greater than 50% respiratory variability, suggesting right atrial pressure of 3 mmHg. IAS/Shunts: No atrial level shunt detected by color flow Doppler.  LEFT VENTRICLE PLAX 2D LVIDd:         4.40 cm  Diastology LVIDs:         3.10 cm  LV e' medial:    8.92 cm/s LV PW:         1.60 cm  LV E/e' medial:  9.8 LV IVS:        1.57 cm  LV e' lateral:   9.14 cm/s LVOT diam:     2.10 cm  LV E/e' lateral: 9.6 LV SV:         68 LV SV Index:   29 LVOT Area:     3.46 cm  RIGHT VENTRICLE RV S prime:     15.60 cm/s TAPSE (M-mode): 2.9 cm LEFT ATRIUM             Index LA diam:        4.70 cm 1.98 cm/m LA Vol (A2C):   59.4 ml 25.00 ml/m LA Vol (A4C):   78.8 ml 33.17 ml/m LA Biplane Vol: 75.4 ml 31.74 ml/m  AORTIC VALVE AV Area (Vmax):    2.74 cm AV Area (Vmean):   2.63 cm AV Area (VTI):     2.60 cm AV Vmax:           111.00 cm/s AV Vmean:          75.800 cm/s AV VTI:            0.261 m AV Peak Grad:      4.9 mmHg AV Mean Grad:      3.0 mmHg LVOT Vmax:         87.70 cm/s LVOT Vmean:        57.600 cm/s LVOT VTI:  0.196 m LVOT/AV VTI ratio: 0.75  AORTA Ao Root diam: 3.60 cm Ao Asc diam:  3.60 cm MITRAL VALVE               TRICUSPID VALVE MV Area (PHT): 3.21 cm    TR Peak grad:   32.7 mmHg MV Peak grad:  5.1 mmHg    TR Vmax:        286.00 cm/s MV Mean grad:  2.0 mmHg MV Vmax:        1.13 m/s    SHUNTS MV Vmean:      62.2 cm/s   Systemic VTI:  0.20 m MV Decel Time: 236 msec    Systemic Diam: 2.10 cm MV E velocity: 87.30 cm/s MV A velocity: 76.60 cm/s MV E/A ratio:  1.14 [name] MD Electronically signed by [name] MD Signature Date/Time: 05/27/2020/5:30:41 PM    Final    CT Angio Abd/Pel w/ and/or w/o  Result Date: 05/27/2020 CLINICAL DATA:  Acute lower GI bleeding, left lower quadrant pain EXAM: CT ANGIOGRAPHY ABDOMEN AND PELVIS WITH CONTRAST AND WITHOUT CONTRAST TECHNIQUE: Multidetector CT imaging of the abdomen and pelvis was performed using the standard protocol during bolus administration of intravenous contrast. Multiplanar reconstructed images and MIPs were obtained and reviewed to evaluate the vascular anatomy. CONTRAST:  146mL OMNIPAQUE IOHEXOL 350 MG/ML SOLN COMPARISON:  None. FINDINGS: VASCULAR Aorta: Calcific atherosclerosis most pronounced of the infrarenal aorta with mild infrarenal aortic luminal narrowing but no occlusive process. Negative for aneurysm. No dissection appreciated or retroperitoneal hemorrhage. No evidence of rupture. Celiac: Atherosclerotic origin but remains patent including its branches. No active arterial bleeding within the celiac vascular territory. SMA: Atherosclerotic origin but remains patent including its branches. No active bleeding in the SMA territory. Renals: Atherosclerotic origins with mild ostial narrowing suspected but both renal arteries remain patent. IMA: Calcified narrowed origin but appears to remain patent off the distal aorta including its branches. No active lower GI bleeding in the IMA territory. Inflow: Atherosclerotic and tortuous iliac vasculature without inflow disease or occlusion. No acute pelvic arterial vascular finding. Proximal Outflow: Atherosclerotic changes of the femoral vasculature but all remain patent. Veins: No veno-occlusive process. Review of the MIP images confirms the above findings. NON-VASCULAR  Lower chest: Bibasilar atelectasis/hypoventilatory changes. Left lower lobe bleb noted. Mild cardiomegaly. No pericardial or pleural effusion. Hepatobiliary: No focal liver abnormality is seen. No gallstones, gallbladder wall thickening, or biliary dilatation. Pancreas: Mild diffuse fatty replacement most pronounced of the head and uncinate process. No ductal dilatation or acute process. No surrounding free fluid or inflammation. Spleen: Normal in size without focal abnormality. Adrenals/Urinary Tract: Normal adrenal glands. No focal renal abnormality or obstruction pattern. No hydronephrosis. No upper urinary tract calculi. Ureters are symmetric and decompressed. No hydroureter or ureteral obstruction. Bladder demonstrates a right posterior diverticulum containing layering radiopaque gallstones. Diverticula measures 4 cm, image 76/series 12. Areas of bladder wall thickening and smaller diverticula noted on the left side. Bladder stones noted along the left bladder wall measuring up to 8 mm. These bladder findings appear to be chronic. Stomach/Bowel: Slight strandy edema about the duodenum proximally suggesting nonspecific duodenitis. Small duodenal diverticulum also noted with an air-fluid level. Negative for bowel obstruction, significant dilatation, ileus, or free air. No evidence of appendicitis. No acute colitis or diverticulitis. No free fluid, fluid collection, hemorrhage, hematoma, abscess, or ascites. Lymphatic: No bulky adenopathy. Reproductive: Prostate calcifications noted. Lower pelvis is obscured by artifact from the hip replacements. Other: Intact abdominal wall.  No hernia. Musculoskeletal: Hip arthroplasty is noted bilaterally creating artifact through the lower pelvis. Degenerative changes of the spine. No acute osseous finding. IMPRESSION: VASCULAR Negative for active arterial GI bleeding. No other acute finding by CTA. Aortoiliac atherosclerosis as detailed above. NON-VASCULAR Mild strandy edema  and wall thickening of the duodenum, compatible with nonspecific duodenitis. This can be seen with peptic ulcer disease. No evidence of free air or perforation. Consider correlation with upper endoscopy. Chronic bladder diverticula containing radiopaque stones. No acute obstructive uropathy or ureteral calculus. Electronically Signed   By: Jerilynn Mages.  Shick M.D.   On: 05/27/2020 15:18     Current Outpatient Medications (Endocrine & Metabolic):  .  levothyroxine (SYNTHROID) 100 MCG tablet, Take      1 tablet       Daily       on an empty stomach with only water for 30 minutes & no Antacid meds, Calcium or Magnesium for 4 hours & avoid Biotin (Patient taking differently: Take 100 mcg by mouth daily before breakfast.) .  metFORMIN (GLUCOPHAGE-XR) 500 MG 24 hr tablet, Take     2 tablets     2 x /day     with Meals       for Diabetes (Patient taking differently: Take 1,000 mg by mouth 2 (two) times daily.)  Current Outpatient Medications (Cardiovascular):  .  atorvastatin (LIPITOR) 80 MG tablet, TAKE 1 TABLET(80 MG) BY MOUTH DAILY AT 6 PM (Patient taking differently: Take 40 mg by mouth daily.) .  carvedilol (COREG) 12.5 MG tablet, Take 2 tablet 2 x  /day for BP & Heart .  olmesartan (BENICAR) 40 MG tablet, Take 1 tablet Daily for BP (Patient taking differently: Take 40 mg by mouth daily.) .  amLODipine (NORVASC) 10 MG tablet, Take 1 tab (10 mg) Daily For BP & Heart  Current Outpatient Medications (Respiratory):  .  loratadine (CLARITIN) 10 MG tablet, Take 10 mg by mouth daily.  Current Outpatient Medications (Analgesics):  .  acetaminophen (TYLENOL) 325 MG tablet, Take 650 mg by mouth every 6 (six) hours as needed for mild pain. [name]  allopurinol (ZYLOPRIM) 300 MG tablet, Take      1 tablet       Daily        to Prevent Gout .  colchicine 0.6 MG tablet, Take 1 tablet daily to prevent Gout Attacks (Patient not taking: Reported on 06/19/2020)  Current Outpatient Medications (Hematological):  .  warfarin  (COUMADIN) 5 MG tablet, Hold until 06/03/20 Resume 06/03/20  Current Outpatient Medications (Other):  [name]  Ascorbic Acid (VITAMIN C WITH ROSE HIPS) 500 MG tablet, Take 500 mg by mouth daily. .  B Complex Vitamins (B COMPLEX PO), Take 1 tablet by mouth daily. .  cholecalciferol (VITAMIN D) 1000 UNITS tablet, Take 2,000 Units by mouth daily. .  magnesium gluconate (MAGONATE) 500 MG tablet, Take 1,000 mg by mouth daily.  .  Omega-3 Fatty Acids (FISH OIL) 1200 MG CAPS, Take 1,200 mg by mouth daily. .  pantoprazole (PROTONIX) 40 MG tablet, Take 1 tablet (40 mg total) by mouth 2 (two) times daily before a meal. Take 2 times daily for 90 days then once daily indefinetly .  zinc gluconate 50 MG tablet, Take 50 mg by mouth daily.  Past Medical History:  Diagnosis Date  . Arthritis    "joints get stiff" (06/11/2016)  . Bee sting allergy   . Bee sting allergy   . CAD (coronary artery disease), native coronary artery 02/12/2016  cath showing 50% LM, 40% distal LAD, 70% ost to prox LCs, 40% mid LAD and 95% mid to dist RCA s/p PCI with DES x 2 and stage PCI of the LCx/OM.  [name] Carotid artery stenosis    1-39% bilateral by dopplers 08/2017  . History of gout   . Hx of adenomatous colonic polyps 04/10/2008  . Hyperlipidemia   . Hypertension   . Hypothyroidism   . OSA (obstructive sleep apnea)    "tried CPAP; couldn't do it" (06/11/2016)  . Persistent atrial fibrillation ([place])    s/p DCCV 03/2015 with reversion back to atrial fibrillation, loaded with Amio and s/p DCCV to NSR 11/2015.  He is now s/p afib albation.  He had amio lung toxicity and amio was stopped.  . Thyroid disease   . Type II diabetes mellitus ([place])   . Vitamin D deficiency      Allergies  Allergen Reactions  . Amiodarone Other (See Comments)    Lung toxicity  . Bee Venom Other (See Comments)    Leg went numb    ROS: all negative except above.   Physical Exam: Filed Weights   06/19/20 1611  Weight: 249 lb (112.9 kg)   BP (!)  160/82   Pulse 73   Temp (!) 97.5 F (36.4 C)   Wt 249 lb (112.9 kg)   SpO2 96%   BMI 33.77 kg/m  General Appearance: Well nourished, in no apparent distress. Eyes: PERRLA, EOMs, conjunctiva no swelling or erythema Sinuses: No Frontal/maxillary tenderness ENT/Mouth: Ext aud canals clear, TMs without erythema, bulging. No erythema, swelling, or exudate on post pharynx.  Tonsils not swollen or erythematous. Hearing normal.  Neck: Supple, thyroid normal.  Respiratory: Respiratory effort normal, BS equal bilaterally without rales, rhonchi, wheezing or stridor.  Cardio: RRR with no MRGs. Brisk peripheral pulses without edema.  Abdomen: Soft, + BS.  Non tender, no guarding, rebound, hernias, masses. Lymphatics: Non tender without lymphadenopathy.  Musculoskeletal: Full ROM, 5/5 strength, slow steady gait with cane Skin: Warm, dry without rashes, lesions, ecchymosis.  Neuro: Cranial nerves intact. Normal muscle tone, no cerebellar symptoms. Sensation intact.  Psych: Awake and oriented X 3, normal affect, Insight and Judgment appropriate.     Izora Ribas, NP 5:32 PM [facility]

## 2020-06-20 LAB — CBC WITH DIFFERENTIAL/PLATELET
Absolute Monocytes: 602 cells/uL (ref 200–950)
Basophils Absolute: 28 cells/uL (ref 0–200)
Basophils Relative: 0.4 %
Eosinophils Absolute: 259 cells/uL (ref 15–500)
Eosinophils Relative: 3.7 %
HCT: 40.4 % (ref 38.5–50.0)
Hemoglobin: 13.6 g/dL (ref 13.2–17.1)
Lymphs Abs: 1589 cells/uL (ref 850–3900)
MCH: 32.2 pg (ref 27.0–33.0)
MCHC: 33.7 g/dL (ref 32.0–36.0)
MCV: 95.5 fL (ref 80.0–100.0)
MPV: 10.1 fL (ref 7.5–12.5)
Monocytes Relative: 8.6 %
Neutro Abs: 4522 cells/uL (ref 1500–7800)
Neutrophils Relative %: 64.6 %
Platelets: 293 10*3/uL (ref 140–400)
RBC: 4.23 10*6/uL (ref 4.20–5.80)
RDW: 15.3 % — ABNORMAL HIGH (ref 11.0–15.0)
Total Lymphocyte: 22.7 %
WBC: 7 10*3/uL (ref 3.8–10.8)

## 2020-06-20 LAB — COMPLETE METABOLIC PANEL WITH GFR
AG Ratio: 1.3 (calc) (ref 1.0–2.5)
ALT: 11 U/L (ref 9–46)
AST: 13 U/L (ref 10–35)
Albumin: 4.3 g/dL (ref 3.6–5.1)
Alkaline phosphatase (APISO): 93 U/L (ref 35–144)
BUN: 15 mg/dL (ref 7–25)
CO2: 28 mmol/L (ref 20–32)
Calcium: 9.3 mg/dL (ref 8.6–10.3)
Chloride: 102 mmol/L (ref 98–110)
Creat: 1 mg/dL (ref 0.70–1.18)
GFR, Est African American: 88 mL/min/{1.73_m2} (ref >=60)
GFR, Est Non African American: 76 mL/min/{1.73_m2} (ref >=60)
Globulin: 3.3 g/dL (calc) (ref 1.9–3.7)
Glucose, Bld: 110 mg/dL — ABNORMAL HIGH (ref 65–99)
Potassium: 4 mmol/L (ref 3.5–5.3)
Sodium: 138 mmol/L (ref 135–146)
Total Bilirubin: 0.6 mg/dL (ref 0.2–1.2)
Total Protein: 7.6 g/dL (ref 6.1–8.1)

## 2020-07-05 ENCOUNTER — Other Ambulatory Visit: Payer: Self-pay | Admitting: Internal Medicine

## 2020-07-05 DIAGNOSIS — E039 Hypothyroidism, unspecified: Secondary | ICD-10-CM

## 2020-07-05 DIAGNOSIS — M1A9XX0 Chronic gout, unspecified, without tophus (tophi): Secondary | ICD-10-CM

## 2020-07-05 DIAGNOSIS — I10 Essential (primary) hypertension: Secondary | ICD-10-CM

## 2020-07-06 ENCOUNTER — Other Ambulatory Visit: Payer: Self-pay | Admitting: Adult Health

## 2020-07-08 ENCOUNTER — Ambulatory Visit: Payer: Medicare Other | Admitting: Internal Medicine

## 2020-07-08 ENCOUNTER — Other Ambulatory Visit (INDEPENDENT_AMBULATORY_CARE_PROVIDER_SITE_OTHER): Payer: Medicare Other

## 2020-07-08 ENCOUNTER — Encounter: Payer: Self-pay | Admitting: Internal Medicine

## 2020-07-08 ENCOUNTER — Ambulatory Visit (INDEPENDENT_AMBULATORY_CARE_PROVIDER_SITE_OTHER): Payer: Medicare Other

## 2020-07-08 ENCOUNTER — Other Ambulatory Visit: Payer: Self-pay

## 2020-07-08 VITALS — BP 128/70 | HR 67 | Ht 72.0 in | Wt 243.0 lb

## 2020-07-08 DIAGNOSIS — K264 Chronic or unspecified duodenal ulcer with hemorrhage: Secondary | ICD-10-CM

## 2020-07-08 DIAGNOSIS — I4819 Other persistent atrial fibrillation: Secondary | ICD-10-CM | POA: Diagnosis not present

## 2020-07-08 DIAGNOSIS — K2270 Barrett's esophagus without dysplasia: Secondary | ICD-10-CM | POA: Diagnosis not present

## 2020-07-08 DIAGNOSIS — Z5181 Encounter for therapeutic drug level monitoring: Secondary | ICD-10-CM | POA: Diagnosis not present

## 2020-07-08 DIAGNOSIS — Z7901 Long term (current) use of anticoagulants: Secondary | ICD-10-CM | POA: Diagnosis not present

## 2020-07-08 DIAGNOSIS — D62 Acute posthemorrhagic anemia: Secondary | ICD-10-CM

## 2020-07-08 LAB — CBC WITH DIFFERENTIAL/PLATELET
Basophils Absolute: 0 10*3/uL (ref 0.0–0.1)
Basophils Relative: 0.4 % (ref 0.0–3.0)
Eosinophils Absolute: 0.2 10*3/uL (ref 0.0–0.7)
Eosinophils Relative: 1.9 % (ref 0.0–5.0)
HCT: 41.7 % (ref 39.0–52.0)
Hemoglobin: 14.1 g/dL (ref 13.0–17.0)
Lymphocytes Relative: 16.6 % (ref 12.0–46.0)
Lymphs Abs: 1.5 10*3/uL (ref 0.7–4.0)
MCHC: 34 g/dL (ref 30.0–36.0)
MCV: 96.6 fl (ref 78.0–100.0)
Monocytes Absolute: 0.8 10*3/uL (ref 0.1–1.0)
Monocytes Relative: 8.9 % (ref 3.0–12.0)
Neutro Abs: 6.4 10*3/uL (ref 1.4–7.7)
Neutrophils Relative %: 72.2 % (ref 43.0–77.0)
Platelets: 280 10*3/uL (ref 150.0–400.0)
RBC: 4.31 Mil/uL (ref 4.22–5.81)
RDW: 17 % — ABNORMAL HIGH (ref 11.5–15.5)
WBC: 8.9 10*3/uL (ref 4.0–10.5)

## 2020-07-08 LAB — VITAMIN B12: Vitamin B-12: 541 pg/mL (ref 211–911)

## 2020-07-08 LAB — FERRITIN: Ferritin: 38.6 ng/mL (ref 22.0–322.0)

## 2020-07-08 LAB — H. PYLORI ANTIBODY, IGG: H Pylori IgG: NEGATIVE

## 2020-07-08 LAB — POCT INR: INR: 2.5 (ref 2.0–3.0)

## 2020-07-08 MED ORDER — PANTOPRAZOLE SODIUM 40 MG PO TBEC: 40.0000 mg | DELAYED_RELEASE_TABLET | Freq: Every day | ORAL | 0 refills | Status: DC

## 2020-07-08 NOTE — Patient Instructions (Addendum)
Your provider has requested that you go to the basement level for lab work before leaving today. Press "B" on the elevator. The lab is located at the first door on the left as you exit the elevator.  We are giving you a handout to read on Barrett's.  Due to recent changes in healthcare laws, you may see the results of your imaging and laboratory studies on MyChart before your provider has had a chance to review them.  We understand that in some cases there may be results that are confusing or concerning to you. Not all laboratory results come back in the same time frame and the provider may be waiting for multiple results in order to interpret others.  Please give [place] 48 hours in order for your provider to thoroughly review all the results before contacting the office for clarification of your results.   Change your pantoprazole to once daily before breakfast per Dr Carlean Purl.  We will put you in the system for an EGD recall for 05/2023.   I appreciate the opportunity to care for you. Silvano Rusk, MD, [facility]

## 2020-07-08 NOTE — Progress Notes (Signed)
Christopher Wade 70 y.o. [birth year] [id number]  Assessment & Plan:   Encounter Diagnoses  Name Primary?  . Duodenal ulcer with hemorrhage Yes  . Barrett's esophagus without dysplasia   . Acute blood loss anemia   . Chronic anticoagulation     He is improved.  Anticipate repeating an EGD in about 3 years for Barrett's surveillance.  Lab follow-up regarding anemia as below.  I also want to see if he is positive for H. pylori if he is I would withhold a PPI at some point and do stool testing or breath testing for H. pylori.  We do not know why he had the duodenal ulcers.  He was not using NSAIDs or anything like that.  The images and characteristics of the ulcers at endoscopy themselves raise suggestion of ischemia to me.  He did have a lot of atherosclerosis really some so a ruptured plaque with embolic phenomenon could have been the cause of this.  He also has atrial fibrillation though he was on warfarin.  Normal echocardiogram during that hospitalization on January 3 though no bubble study Orders Placed This Encounter  Procedures  . CBC with Differential/Platelet  . Ferritin  . Vitamin B12  . H. pylori antibody, IgG    Meds ordered this encounter  Medications  . pantoprazole (PROTONIX) 40 MG tablet    Sig: Take 1 tablet (40 mg total) by mouth daily before breakfast.    Dispense:  180 tablet    Refill:  0   I appreciate the opportunity to care for this patient.  CC: Christopher Pinto, MD  Lab Results  Component Value Date   WBC 8.9 07/08/2020   HGB 14.1 07/08/2020   HCT 41.7 07/08/2020   MCV 96.6 07/08/2020   PLT 280.0 07/08/2020   Lab Results  Component Value Date   FERRITIN 38.6 07/08/2020   H. pylori serology negative so he did not have H. Pylori  Lab Results  Component Value Date   [id number] 025 07/08/2020   I have thought about this further and plan to refer Wade to cardiology.  Question could he have a patent foramen ovale (seems unlikely given his  previous work-up) or could have he ruptured a plaque in one of his atherosclerotic gut arteries (seems more likely).  I am not sure anything different should be done but I think it merits a follow-up with Christopher Wade, his primary cardiologist.  We will be notifying the patient and requesting an appointment.   Christopher Wade, Christopher Saxon, MD Christopher Him, MD  Subjective:   Chief Complaint: Follow-up of duodenal ulcer  HPI Christopher Wade is a 70 year old white man discharged from the hospital in January due to GI bleeding from duodenal ulcer and also diagnosed with short segment Barrett's esophagus without dysplasia at endoscopy by Dr. Hilarie Wade.  He is here with his niece.  He is not experiencing any more hematochezia or melena.  No abdominal pain.  Using a crutch to steady himself for a cane due to some balance issues.  Feels like he is improving.  He is not driving as of yet.  Continues on twice daily PPI.   The clinical impression was that of ischemic colitis because of onset of constipation several days before admission and some lower abdominal pressure and discomfort.  Initially so a CT was undertaken and showed the following: May 27, 2020 CT angiogram abdomen and pelvis   Celiac: Atherosclerotic origin but remains patent including its branches. No active arterial bleeding within the celiac vascular  territory.  SMA: Atherosclerotic origin but remains patent including its branches. No active bleeding in the SMA territory.   IMPRESSION: VASCULAR  Negative for active arterial GI bleeding.  No other acute finding by CTA.  Aortoiliac atherosclerosis as detailed above.  NON-VASCULAR  Mild strandy edema and wall thickening of the duodenum, compatible with nonspecific duodenitis. This can be seen with peptic ulcer disease. No evidence of free air or perforation. Consider correlation with upper endoscopy.  Chronic bladder diverticula containing radiopaque stones. No acute obstructive  uropathy or ureteral calculus.     EGD impression from May 28, 2020 report and images viewed Esophageal mucosal changes suggestive of short-segment Barrett's esophagus. Biopsied. - 2 cm hiatal hernia. - Gastritis. Biopsied. - 3 duodenal ulcers with pigmented material. This is the source of heme positive and dark stools.   Biopsies from the stomach which showed changes of gastritis were negative for H. pylori.  He was on PPI infusion at that time. Allergies  Allergen Reactions  . Amiodarone Other (See Comments)    Lung toxicity  . Bee Venom Other (See Comments)    Leg went numb   Current Meds  Medication Sig  . acetaminophen (TYLENOL) 325 MG tablet Take 650 mg by mouth every 6 (six) hours as needed for mild pain.  [name] allopurinol (ZYLOPRIM) 300 MG tablet TAKE 1 TABLET BY MOUTH DAILY FOR GOUT PREVENTION  . amLODipine (NORVASC) 10 MG tablet Take 1 tab (10 mg) Daily For BP & Heart  . Ascorbic Acid (VITAMIN C WITH ROSE HIPS) 500 MG tablet Take 500 mg by mouth daily.  [name] atorvastatin (LIPITOR) 80 MG tablet TAKE 1 TABLET(80 MG) BY MOUTH DAILY AT 6 PM (Patient taking differently: Take 40 mg by mouth daily.)  . B Complex Vitamins (B COMPLEX PO) Take 1 tablet by mouth daily.  . carvedilol (COREG) 12.5 MG tablet Take 2 tablet 2 x  /day for BP & Heart  . cholecalciferol (VITAMIN D) 1000 UNITS tablet Take 2,000 Units by mouth daily.  . colchicine 0.6 MG tablet Take 1 tablet daily to prevent Gout Attacks  . levothyroxine (SYNTHROID) 100 MCG tablet TAKE 1 TABLET DAILY ON EMPTY STOMACH WITH WATER FOR 30 MINS& NO ANTACID MEDS, CALCIUM OR MAGNESIUM FOR 4 HOURS& AVOID BIOTIN  . loratadine (CLARITIN) 10 MG tablet Take 10 mg by mouth daily.  . magnesium gluconate (MAGONATE) 500 MG tablet Take 1,000 mg by mouth daily.   . metFORMIN (GLUCOPHAGE-XR) 500 MG 24 hr tablet Take     2 tablets     2 x /day     with Meals       for Diabetes (Patient taking differently: Take 1,000 mg by mouth 2 (two) times  daily.)  . olmesartan (BENICAR) 40 MG tablet Take 1 tablet Daily for BP (Patient taking differently: Take 40 mg by mouth daily.)  . Omega-3 Fatty Acids (FISH OIL) 1200 MG CAPS Take 1,200 mg by mouth daily.  . pantoprazole (PROTONIX) 40 MG tablet Take 1 tablet (40 mg total) by mouth 2 (two) times daily before a meal. Take 2 times daily for 90 days then once daily indefinetly  . warfarin (COUMADIN) 5 MG tablet Hold until 06/03/20 Resume 06/03/20 (Patient taking differently: Take by mouth daily.)  . zinc gluconate 50 MG tablet Take 50 mg by mouth daily.   Past Medical History:  Diagnosis Date  . AKI (acute kidney injury) ([place]) 05/27/2020  . Arthritis    "joints get stiff" (06/11/2016)  . Bee sting  allergy   . Bee sting allergy   . CAD (coronary artery disease), native coronary artery 02/12/2016   cath showing 50% LM, 40% distal LAD, 70% ost to prox LCs, 40% mid LAD and 95% mid to dist RCA s/p PCI with DES x 2 and stage PCI of the LCx/OM.  [name] Carotid artery stenosis    1-39% bilateral by dopplers 08/2017  . History of gout   . Hx of adenomatous colonic polyps 04/10/2008  . Hyperlipidemia   . Hypertension   . Hypothyroidism   . OSA (obstructive sleep apnea)    "tried CPAP; couldn't do it" (06/11/2016)  . Persistent atrial fibrillation ([place])    s/p DCCV 03/2015 with reversion back to atrial fibrillation, loaded with Amio and s/p DCCV to NSR 11/2015.  He is now s/p afib albation.  He had amio lung toxicity and amio was stopped.  . Thyroid disease   . Type II diabetes mellitus ([place])   . Vitamin D deficiency    Past Surgical History:  Procedure Laterality Date  . BIOPSY  05/28/2020   Procedure: BIOPSY;  Surgeon: Jerene Bears, MD;  Location: [facility];  Service: Gastroenterology;;  . CARDIAC CATHETERIZATION N/A 10/11/2015   Procedure: Left Heart Cath and Coronary Angiography;  Surgeon: [name], MD;  Location: [facility];  Service: Cardiovascular;  Laterality: N/A;  . CARDIAC  CATHETERIZATION N/A 10/11/2015   Procedure: Coronary Stent Intervention;  Surgeon: [name], MD;  Location: [place] [facility];  Service: Cardiovascular;  Laterality: N/A;  . CARDIAC CATHETERIZATION N/A 10/11/2015   Procedure: Intravascular Pressure Wire/FFR Study;  Surgeon: [name], MD;  Location: [facility];  Service: Cardiovascular;  Laterality: N/A;  . CARDIAC CATHETERIZATION N/A 10/14/2015   Procedure: Coronary Stent Intervention;  Surgeon: [name], MD;  Location: [facility];  Service: Cardiovascular;  Laterality: N/A;  . CARDIOVERSION N/A 04/05/2015   Procedure: CARDIOVERSION;  Surgeon: [name], MD;  Location: [facility];  Service: Cardiovascular;  Laterality: N/A;  . CARDIOVERSION N/A 12/09/2015   Procedure: CARDIOVERSION;  Surgeon: [name], MD;  Location: [facility];  Service: Cardiovascular;  Laterality: N/A;  . CARDIOVERSION N/A 03/16/2016   Procedure: CARDIOVERSION;  Surgeon: [name], MD;  Location: [facility];  Service: Cardiovascular;  Laterality: N/A;  . CATARACT EXTRACTION Right 2019   Dr. Patrici Ranks  . CORONARY ANGIOPLASTY    . ELECTROPHYSIOLOGIC STUDY N/A 06/11/2016   Procedure: Atrial Fibrillation Ablation;  Surgeon: Will [name], MD;  Location: [facility];  Service: Cardiovascular;  Laterality: N/A;  . ESOPHAGOGASTRODUODENOSCOPY (EGD) WITH PROPOFOL N/A 05/28/2020   Procedure: ESOPHAGOGASTRODUODENOSCOPY (EGD) WITH PROPOFOL;  Surgeon: Jerene Bears, MD;  [facility];  Service: Gastroenterology;  Laterality: N/A;  . TOTAL HIP ARTHROPLASTY Bilateral 2001  . UVULECTOMY     Social History   Social History Narrative   He is single, he has been in [employer] for his whole life.  Currently struggling to find much work.09/28/2017   Denies alcohol tobacco or drug use   family history includes Colon cancer in his sister; Liver disease in his brother, father, mother, and sister; Lung cancer in his  father and mother; Prostate cancer in his brother.   Review of Systems As per HPI  Objective:   Physical Exam BP 128/70   Pulse 67   Ht 6' (1.829 m)   Wt 243 lb (110.2 kg)   SpO2 97%   BMI 32.96  kg/m  Elderly white man slow gait a little unsteady requires assistance onto the exam table but not a lot Lungs clear Heart sounds are normal S1-S2 irregular rhythm Abdomen soft nontender no organomegaly or mass

## 2020-07-08 NOTE — Patient Instructions (Signed)
Continue taking Warfarin 1.5 tablets daily except 1 tablet on Sundays and Thursdays. Recheck INR in 4 weeks. Coumadin Clinic: [phone] Main number: [phone]

## 2020-07-10 ENCOUNTER — Other Ambulatory Visit: Payer: Self-pay

## 2020-07-10 DIAGNOSIS — I749 Embolism and thrombosis of unspecified artery: Secondary | ICD-10-CM

## 2020-07-15 ENCOUNTER — Encounter: Payer: Self-pay | Admitting: Cardiology

## 2020-07-15 ENCOUNTER — Other Ambulatory Visit: Payer: Self-pay

## 2020-07-15 ENCOUNTER — Ambulatory Visit: Payer: Medicare Other | Admitting: Cardiology

## 2020-07-15 VITALS — BP 118/62 | HR 62 | Ht 72.0 in | Wt 245.8 lb

## 2020-07-15 DIAGNOSIS — I4819 Other persistent atrial fibrillation: Secondary | ICD-10-CM | POA: Diagnosis not present

## 2020-07-15 NOTE — Patient Instructions (Signed)
Medication Instructions:  Your physician recommends that you continue on your current medications as directed. Please refer to the Current Medication list given to you today.  *If you need a refill on your cardiac medications before your next appointment, please call your pharmacy*   Lab Work: None ordered   Testing/Procedures: None ordered   Follow-Up: At [facility], you and your health needs are our priority.  As part of our continuing mission to provide you with exceptional heart care, we have created designated Provider Care Teams.  These Care Teams include your primary Cardiologist (physician) and Advanced Practice Providers (APPs -  Physician Assistants and Nurse Practitioners) who all work together to provide you with the care you need, when you need it.  We recommend signing up for the patient portal called "MyChart".  Sign up information is provided on this After Visit Summary.  MyChart is used to connect with patients for Virtual Visits (Telemedicine).  Patients are able to view lab/test results, encounter notes, upcoming appointments, etc.  Non-urgent messages can be sent to your provider as well.   To learn more about what you can do with MyChart, go to [email or web address].    Your next appointment:   1 year(s)  The format for your next appointment:   In Person  Provider:   Will Camnitz, MD    Thank you for choosing [facility]!!   Shandrea Lusk, RN [phone]    

## 2020-07-15 NOTE — Progress Notes (Signed)
Electrophysiology Office Note   Date:  07/15/2020   ID:  Christopher Wade, Christopher Wade 17-Nov-1950, MRN [id number]  PCP:  [name], MD  Cardiologist:  Radford Pax Primary Electrophysiologist:  Will [name], MD    No chief complaint on file.    History of Present Illness: Christopher Wade is a 70 y.o. male who presents today for electrophysiology evaluation.     He has a history of atrial fibrillation status post cardioversion November 2016. He unfortunately quickly went back into atrial fibrillation. He also has hypertension, hyperlipidemia, diabetes, coronary artery disease status post RCA, circumflex stent. He was initially loaded on amiodarone and cardioverted but went back into atrial fibrillation. He is status post AF ablation 06/11/2016.  Today, denies symptoms of palpitations, chest pain, shortness of breath, orthopnea, PND, lower extremity edema, claudication, dizziness, presyncope, syncope, bleeding, or neurologic sequela. The patient is tolerating medications without difficulties. Unfortunately was hospitalized in January 2022 with a GI bleed. There was a thought that this could be due to ischemia due to characterization of ulcers during endoscopy. He has done well since then. He has no chest pain or shortness of breath. He is able do all of his daily activities.  Past Medical History:  Diagnosis Date  . AKI (acute kidney injury) ([place]) 05/27/2020  . Arthritis    "joints get stiff" (06/11/2016)  . Bee sting allergy   . Bee sting allergy   . CAD (coronary artery disease), native coronary artery 02/12/2016   cath showing 50% LM, 40% distal LAD, 70% ost to prox LCs, 40% mid LAD and 95% mid to dist RCA s/p PCI with DES x 2 and stage PCI of the LCx/OM.  [name] Carotid artery stenosis    1-39% bilateral by dopplers 08/2017  . History of gout   . Hx of adenomatous colonic polyps 04/10/2008  . Hyperlipidemia   . Hypertension   . Hypothyroidism   . OSA (obstructive sleep apnea)    "tried  CPAP; couldn't do it" (06/11/2016)  . Persistent atrial fibrillation ([place])    s/p DCCV 03/2015 with reversion back to atrial fibrillation, loaded with Amio and s/p DCCV to NSR 11/2015.  He is now s/p afib albation.  He had amio lung toxicity and amio was stopped.  . Thyroid disease   . Type II diabetes mellitus ([place])   . Vitamin D deficiency    Past Surgical History:  Procedure Laterality Date  . BIOPSY  05/28/2020   Procedure: BIOPSY;  Surgeon: Jerene Bears, MD;  Location: [facility];  Service: Gastroenterology;;  . CARDIAC CATHETERIZATION N/A 10/11/2015   Procedure: Left Heart Cath and Coronary Angiography;  Surgeon: [name], MD;  Location: [facility];  Service: Cardiovascular;  Laterality: N/A;  . CARDIAC CATHETERIZATION N/A 10/11/2015   Procedure: Coronary Stent Intervention;  Surgeon: [name], MD;  Location: [facility];  Service: Cardiovascular;  Laterality: N/A;  . CARDIAC CATHETERIZATION N/A 10/11/2015   Procedure: Intravascular Pressure Wire/FFR Study;  Surgeon: [name], MD;  Location: [facility] [facility];  Service: Cardiovascular;  Laterality: N/A;  . CARDIAC CATHETERIZATION N/A 10/14/2015   Procedure: Coronary Stent Intervention;  Surgeon: [name], MD;  Location: [facility];  Service: Cardiovascular;  Laterality: N/A;  . CARDIOVERSION N/A 04/05/2015   Procedure: CARDIOVERSION;  Surgeon: [name], MD;  Location: [facility];  Service: Cardiovascular;  Laterality: N/A;  . CARDIOVERSION N/A 12/09/2015   Procedure: CARDIOVERSION;  Surgeon: Tressia Miners  Remonia Richter, MD;  Location: [facility] [place];  Service: Cardiovascular;  Laterality: N/A;  . CARDIOVERSION N/A 03/16/2016   Procedure: CARDIOVERSION;  Surgeon: [name], MD;  Location: [facility];  Service: Cardiovascular;  Laterality: N/A;  . CATARACT EXTRACTION Right 2019   Dr. Patrici Ranks  . CORONARY ANGIOPLASTY    . ELECTROPHYSIOLOGIC STUDY N/A 06/11/2016   Procedure: Atrial  Fibrillation Ablation;  Surgeon: Will [name], MD;  Location: [facility];  Service: Cardiovascular;  Laterality: N/A;  . ESOPHAGOGASTRODUODENOSCOPY (EGD) WITH PROPOFOL N/A 05/28/2020   Procedure: ESOPHAGOGASTRODUODENOSCOPY (EGD) WITH PROPOFOL;  Surgeon: Jerene Bears, MD;  Location: [facility];  Service: Gastroenterology;  Laterality: N/A;  . TOTAL HIP ARTHROPLASTY Bilateral 2001  . UVULECTOMY       Current Outpatient Medications  Medication Sig Dispense Refill  . acetaminophen (TYLENOL) 325 MG tablet Take 650 mg by mouth every 6 (six) hours as needed for mild pain.    [name] allopurinol (ZYLOPRIM) 300 MG tablet TAKE 1 TABLET BY MOUTH DAILY FOR GOUT PREVENTION 90 tablet 1  . amLODipine (NORVASC) 10 MG tablet Take 1 tab (10 mg) Daily For BP & Heart 90 tablet 1  . Ascorbic Acid (VITAMIN C WITH ROSE HIPS) 500 MG tablet Take 500 mg by mouth daily.    [name] atorvastatin (LIPITOR) 80 MG tablet Take 40 mg by mouth daily.    . B Complex Vitamins (B COMPLEX PO) Take 1 tablet by mouth daily.    . carvedilol (COREG) 12.5 MG tablet Take 2 tablet 2 x  /day for BP & Heart 360 tablet 0  . cholecalciferol (VITAMIN D) 1000 UNITS tablet Take 2,000 Units by mouth daily.    . colchicine 0.6 MG tablet Take 1 tablet daily to prevent Gout Attacks 90 tablet 3  . levothyroxine (SYNTHROID) 100 MCG tablet TAKE 1 TABLET DAILY ON EMPTY STOMACH WITH WATER FOR 30 MINS& NO ANTACID MEDS, CALCIUM OR MAGNESIUM FOR 4 HOURS& AVOID BIOTIN 90 tablet 1  . loratadine (CLARITIN) 10 MG tablet Take 10 mg by mouth daily.    . magnesium gluconate (MAGONATE) 500 MG tablet Take 1,000 mg by mouth daily.     . metFORMIN (GLUCOPHAGE-XR) 500 MG 24 hr tablet Take     2 tablets     2 x /day     with Meals       for Diabetes (Patient taking differently: Take     2 tablets     2 x /day     with Meals       for Diabetes) 360 tablet 0  . olmesartan (BENICAR) 40 MG tablet Take 1 tablet Daily for BP 90 tablet 0  . Omega-3 Fatty Acids (FISH OIL)  1200 MG CAPS Take 1,200 mg by mouth daily.    . pantoprazole (PROTONIX) 40 MG tablet Take 1 tablet (40 mg total) by mouth daily before breakfast. 180 tablet 0  . warfarin (COUMADIN) 5 MG tablet Hold until 06/03/20 Resume 06/03/20 45 tablet 3  . zinc gluconate 50 MG tablet Take 50 mg by mouth daily.     No current facility-administered medications for this visit.    Allergies:   Amiodarone and Bee venom   Social History:  The patient  reports that he has never smoked. He has never used smokeless tobacco. He reports that he does not drink alcohol and does not use drugs.   Family History:  The patient's family history includes Colon cancer in his sister; Liver disease in his brother,  father, mother, and sister; Lung cancer in his father and mother; Prostate cancer in his brother.   ROS:  Please see the history of present illness.   Otherwise, review of systems is positive for none.   All other systems are reviewed and negative.   PHYSICAL EXAM: VS:  BP 118/62   Pulse 62   Ht 6' (1.829 m)   Wt 245 lb 12.8 oz (111.5 kg)   SpO2 95%   BMI 33.34 kg/m  , BMI Body mass index is 33.34 kg/m. GEN: Well nourished, well developed, in no acute distress  HEENT: normal  Neck: no JVD, carotid bruits, or masses Cardiac: RRR; no murmurs, rubs, or gallops,no edema  Respiratory:  clear to auscultation bilaterally, normal work of breathing GI: soft, nontender, nondistended, + BS MS: no deformity or atrophy  Skin: warm and dry Neuro:  Strength and sensation are intact Psych: euthymic mood, full affect  EKG:  EKG is not ordered today. Personal review of the ekg ordered 05/27/20 shows sinus rhythm  Recent Labs: 05/07/2020: TSH 3.60 05/27/2020: B Natriuretic Peptide 86.8 05/28/2020: Magnesium 1.5 06/19/2020: ALT 11; BUN 15; Creat 1.00; Potassium 4.0; Sodium 138 07/08/2020: Hemoglobin 14.1; Platelets 280.0    Lipid Panel     Component Value Date/Time   CHOL 94 05/07/2020 1555   TRIG 159 (H) 05/07/2020  1555   HDL 34 (L) 05/07/2020 1555   CHOLHDL 2.8 05/07/2020 1555   VLDL 19 11/30/2016 1609   LDLCALC 36 05/07/2020 1555     Wt Readings from Last 3 Encounters:  07/15/20 245 lb 12.8 oz (111.5 kg)  07/08/20 243 lb (110.2 kg)  06/19/20 249 lb (112.9 kg)      Other studies Reviewed: Additional studies/ records that were reviewed today include: TTE 03/2015, Cath 10/14/15  Review of the above records today demonstrates:  - Left ventricle: The cavity size was normal. Wall thickness was   increased in a pattern of mild LVH. Systolic function was normal.   The estimated ejection fraction was in the range of 60% to 65%.   Indeterminant diastolic function (atrial fibrillation). Wall   motion was normal; there were no regional wall motion   abnormalities. - Aortic valve: There was no stenosis. - Aorta: Borderline dilated aortic root. - Mitral valve: There was trivial regurgitation. - Left atrium: The atrium was mildly dilated. - Right ventricle: The cavity size was moderately dilated. Systolic   function was normal. - Right atrium: The atrium was mildly dilated. - Tricuspid valve: Peak RV-RA gradient (S): 32 mm Hg. - Pulmonary arteries: PA peak pressure: 35 mm Hg (S). - Inferior vena cava: The vessel was normal in size. The   respirophasic diameter changes were in the normal range (>= 50%),   consistent with normal central venous pressure.   Ost 1st Mrg to 1st Mrg lesion to mid circumflex, 95% stenosed. Post intervention with a 2.75 x 32 Synergy, postdilated to 3.5 mm, there is a 0% residual stenosis.  The Synergy stent extends from the OM back into the native circumflex, jailing the mid circumflex. Postprocedure, there is TIMI-3 flow in the circumflex with only a moderate stenosis at the jailed portion.   Cath 10/11/15  LM lesion, 50% stenosed.  Dist LAD lesion, 40% stenosed.  Ost Cx to Prox Cx lesion, 70% stenosed.  1st Mrg lesion, 70% stenosed.  Mid LAD lesion, 40%  stenosed.  Mid RCA to Dist RCA lesion, 95% stenosed. Post intervention, there is a 0% residual stenosis. The lesion  was not previously treated.   ASSESSMENT AND PLAN:  1. Persistent atrial fibrillation/flutter: Currently on warfarin. Status post ablation 06/11/2016. CHA2DS2-VASc of 4. He has remained in sinus rhythm. No changes. I will discuss his GI bleeding with his gastroenterologist to see whether or not he would recommend stopping warfarin.  2. Hypertension: Has whitecoat hypertension. Blood pressure usually normal at home. Well controlled today after adjustment of medications by his PCP.  3. Hyperlipidemia: Continue statin  4. Coronary artery disease: Status post RCA PCI. No current chest pain.  5. Obstructive sleep apnea: CPAP compliance encouraged  Current medicines are reviewed at length with the patient today.   The patient does not have concerns regarding his medicines.  The following changes were made today: None  Labs/ tests ordered today include:  No orders of the defined types were placed in this encounter.    Disposition:   FU with Will Camnitz 12 months  Signed, Will [name], MD  07/15/2020 8:14 AM     [facility] [address] [place] [address] [phone] (office) [phone] (fax)

## 2020-07-25 ENCOUNTER — Other Ambulatory Visit: Payer: Self-pay | Admitting: *Deleted

## 2020-07-25 DIAGNOSIS — E1122 Type 2 diabetes mellitus with diabetic chronic kidney disease: Secondary | ICD-10-CM

## 2020-07-25 DIAGNOSIS — N182 Chronic kidney disease, stage 2 (mild): Secondary | ICD-10-CM

## 2020-07-25 MED ORDER — METFORMIN HCL ER 500 MG PO TB24: ORAL_TABLET | ORAL | 1 refills | Status: DC

## 2020-08-01 ENCOUNTER — Ambulatory Visit: Payer: Medicare Other | Admitting: Adult Health Nurse Practitioner

## 2020-08-05 ENCOUNTER — Encounter: Payer: Self-pay | Admitting: Adult Health Nurse Practitioner

## 2020-08-05 ENCOUNTER — Other Ambulatory Visit: Payer: Self-pay

## 2020-08-05 ENCOUNTER — Ambulatory Visit (INDEPENDENT_AMBULATORY_CARE_PROVIDER_SITE_OTHER): Payer: Medicare Other

## 2020-08-05 ENCOUNTER — Ambulatory Visit (INDEPENDENT_AMBULATORY_CARE_PROVIDER_SITE_OTHER): Payer: Medicare Other | Admitting: Adult Health Nurse Practitioner

## 2020-08-05 VITALS — BP 140/70 | HR 65 | Temp 97.7°F | Ht 72.0 in | Wt 248.0 lb

## 2020-08-05 DIAGNOSIS — I700 Atherosclerosis of aorta: Secondary | ICD-10-CM

## 2020-08-05 DIAGNOSIS — Z0000 Encounter for general adult medical examination without abnormal findings: Secondary | ICD-10-CM

## 2020-08-05 DIAGNOSIS — Z5181 Encounter for therapeutic drug level monitoring: Secondary | ICD-10-CM | POA: Diagnosis not present

## 2020-08-05 DIAGNOSIS — E785 Hyperlipidemia, unspecified: Secondary | ICD-10-CM | POA: Diagnosis not present

## 2020-08-05 DIAGNOSIS — I10 Essential (primary) hypertension: Secondary | ICD-10-CM

## 2020-08-05 DIAGNOSIS — R6889 Other general symptoms and signs: Secondary | ICD-10-CM

## 2020-08-05 DIAGNOSIS — I4819 Other persistent atrial fibrillation: Secondary | ICD-10-CM | POA: Diagnosis not present

## 2020-08-05 DIAGNOSIS — G4733 Obstructive sleep apnea (adult) (pediatric): Secondary | ICD-10-CM

## 2020-08-05 DIAGNOSIS — I2510 Atherosclerotic heart disease of native coronary artery without angina pectoris: Secondary | ICD-10-CM

## 2020-08-05 DIAGNOSIS — E559 Vitamin D deficiency, unspecified: Secondary | ICD-10-CM

## 2020-08-05 DIAGNOSIS — E1169 Type 2 diabetes mellitus with other specified complication: Secondary | ICD-10-CM | POA: Diagnosis not present

## 2020-08-05 DIAGNOSIS — Z0001 Encounter for general adult medical examination with abnormal findings: Secondary | ICD-10-CM

## 2020-08-05 DIAGNOSIS — J849 Interstitial pulmonary disease, unspecified: Secondary | ICD-10-CM

## 2020-08-05 DIAGNOSIS — E039 Hypothyroidism, unspecified: Secondary | ICD-10-CM

## 2020-08-05 DIAGNOSIS — M1A9XX0 Chronic gout, unspecified, without tophus (tophi): Secondary | ICD-10-CM

## 2020-08-05 DIAGNOSIS — N182 Chronic kidney disease, stage 2 (mild): Secondary | ICD-10-CM

## 2020-08-05 DIAGNOSIS — E1122 Type 2 diabetes mellitus with diabetic chronic kidney disease: Secondary | ICD-10-CM

## 2020-08-05 DIAGNOSIS — Z79899 Other long term (current) drug therapy: Secondary | ICD-10-CM

## 2020-08-05 DIAGNOSIS — K922 Gastrointestinal hemorrhage, unspecified: Secondary | ICD-10-CM

## 2020-08-05 LAB — POCT INR: INR: 3.6 — AB (ref 2.0–3.0)

## 2020-08-05 NOTE — Progress Notes (Addendum)
MEDICARE ANNUAL WELLNESS VISIT AND 3 MONTH FOLLOW UP   Assessment:   [name] was seen today for medicare wellness.  Diagnoses and all orders for this visit:  Encounter for Medicare annual wellness exam Yearly  Essential hypertension -Continue medications: Norvasc 5mg , Carvedilol 1.5 tablets BID, Benicar 40mg  DASH diet, exercise and monitor at home. Call if greater than 130/80.  -     CBC with Differential/Platelet -     COMPLETE METABOLIC PANEL WITH GFR -     Magnesium 1.5mg  Daily   Hyperlipidemia associated with type 2 diabetes mellitus (HCC) Continue medications: Atorvastatin 80mg , half tablet daily Discussed dietary and exercise modifications Low fat diet -     Lipid panel  Persistent atrial fibrillation Last OV 07/15/20 coumadin clinic No falls Continue medication: Coumadin current dose 1.5 tablets (7.5mg ) two days a week (Thurs & Sunday) and 1 tablet (5mg ) five days a week. Has follow up one month. Discussed hospital precautions with patient Will continue to monitor   CKD stage 2 due to type 2 diabetes mellitus (HCC) Increase fluids  Avoid NSAIDS Blood pressure control Monitor sugars  Will continue to monitor   Type 2 diabetes mellitus with stage 2 chronic kidney disease, without long-term current use of insulin (HCC) Continue medications: Metformin 500mg  XL two tablets twice a day with meals. Continue diet and exercise.  Perform daily foot/skin check, notify office of any concerning changes.  -     Hemoglobin A1c -     Insulin, random  Chronic gout without tophus, unspecified cause, unspecified site Continue Allopurinol 300mg  daily No recent flares Diet discussed Continue medications. -     Uric acid  OSA (obstructive sleep apnea) Following with neurology for this Having issues with mask not fitting properly Causing xeropthalmia Due to follow up for this  Coronary artery disease involving native coronary artery of native heart without angina  pectoris Discussed dietary and exercise modifications Blood pressure and cholesterol control Per CT 01/29/21  ILD Doing well Continue to monitor No maintenance medication  Obesity (BMI 30.0-34.9) + OSA Discussed dietary and exercise modifications Encouraged healthy behaviors walking 3-4 times a week.  Discussed increasing time or distance of walks.  Hypothyroidism, Taking levothyroxine 161mcg tablet daily Continue medications the same pending lab results Reminded to take on an empty stomach 30-63mins before food.  -     TSH  Vitamin D deficiency Continue supplementation Taking Vitamin D 2,000 IU daily  Medication management - Continued   Call or return with new or worsening symptoms as discussed in appointment.  May contact office via phone [phone] or [place].  Follow up in 3 month(s).  Over 40 minutes of face to face interview, exam, counseling, chart review, and critical decision making was performed  Future Appointments  Date Time Provider [place]  09/02/2020  2:00 PM [facility]  11/06/2020  3:00 PM [name], MD [facility] None  08/05/2021  3:30 PM Garnet Sierras, NP [facility] None     Plan:   During the course of the visit the patient was educated and counseled about appropriate screening and preventive services including:    Pneumococcal vaccine   Influenza vaccine  Prevnar 13  Td vaccine  Screening electrocardiogram  Colorectal cancer screening  Diabetes screening  Glaucoma screening  Nutrition counseling    Subjective:  Christopher Wade is a 70 y.o. male who presents for Medicare Annual Wellness Visit and 3 month follow up for HTN, HLD, DMII, GERD, BPH, and vitamin D  Def.   Patient had upper GI bleed and hospitalized 05/26/20. Had follow up with our office on 06/19/20, see below  *GI bleed with ulcers, gastritis and also found distal short segment Barrett's. Pathology showed acute  and chronic inflammatory changes, neg H. Pylori, intestinal metaplasia without dysplasia or malignancy. He restarted coumadin on 06/03/2020 as was recommended at discharge via coumadin clinic, last INR was 06/17/2020 at 2.5 with follow up planned 07/08/2020 with Dr Carlean Purl.  Plans to repeat EGD in 3 years for Barrett's surveillance.  He has been taking pantoprazole 40 mg now daily decreased from BID discharge recommendations. Denies recurrent pain. He has follow up with Dr. Carlean Purl 07/08/2020 and discussing follow up EGD to confirm resolution. Last CBC 07/09/19: hgb 14.1, hct 41.7.   Today he reports he is doing well.  Denies any diarrhea or stomach pains and his BM is back to baseline.   His blood pressure has been controlled at home, today their BP is BP: 140/70, slightly elevated.  Reports he checks intermittently at home and to goal. He does workout by walking 2-3 days a week. He denies chest pain, shortness of breath, dizziness.  He is on cholesterol medication and denies myalgias. His cholesterol is at goal. The cholesterol last visit was:   Lab Results  Component Value Date   CHOL 129 04/25/2019   HDL 39 (L) 04/25/2019   LDLCALC 62 04/25/2019   TRIG 214 (H) 04/25/2019   CHOLHDL 3.3 04/25/2019   He has been working on diet and exercise for diabetes, and denies hyperglycemia, hypoglycemia , increased appetite, nausea, polydipsia, polyuria, visual disturbances and vomiting. Last A1C in the office was:  Lab Results  Component Value Date   HGBA1C 7.2 (H) 04/25/2019   Last GFR Lab Results  Component Value Date   GFRNONAA 73 04/25/2019     Lab Results  Component Value Date   GFRAA 85 04/25/2019   Patient is on Vitamin D supplement.   Lab Results  Component Value Date   VD25OH 52 04/25/2019      Medication Review:  Current Outpatient Medications (Endocrine & Metabolic):    levothyroxine (SYNTHROID) 100 MCG tablet, TAKE 1 TABLET DAILY ON EMPTY STOMACH WITH WATER FOR 30 MINS& NO  ANTACID MEDS, CALCIUM OR MAGNESIUM FOR 4 HOURS& AVOID BIOTIN   metFORMIN (GLUCOPHAGE-XR) 500 MG 24 hr tablet, Take     2 tablets     2 x /day     with Meals       for Diabetes  Current Outpatient Medications (Cardiovascular):    amLODipine (NORVASC) 10 MG tablet, Take 1 tab (10 mg) Daily For BP & Heart   atorvastatin (LIPITOR) 80 MG tablet, Take 40 mg by mouth daily.   carvedilol (COREG) 12.5 MG tablet, Take 2 tablet 2 x  /day for BP & Heart   olmesartan (BENICAR) 40 MG tablet, Take 1 tablet Daily for BP  Current Outpatient Medications (Respiratory):    loratadine (CLARITIN) 10 MG tablet, Take 10 mg by mouth daily.  Current Outpatient Medications (Analgesics):    acetaminophen (TYLENOL) 325 MG tablet, Take 650 mg by mouth every 6 (six) hours as needed for mild pain.   allopurinol (ZYLOPRIM) 300 MG tablet, TAKE 1 TABLET BY MOUTH DAILY FOR GOUT PREVENTION   colchicine 0.6 MG tablet, Take 1 tablet daily to prevent Gout Attacks  Current Outpatient Medications (Hematological):    warfarin (COUMADIN) 5 MG tablet, Hold until 06/03/20 Resume 06/03/20  Current Outpatient Medications (Other):  Ascorbic Acid (VITAMIN C WITH ROSE HIPS) 500 MG tablet, Take 500 mg by mouth daily.   B Complex Vitamins (B COMPLEX PO), Take 1 tablet by mouth daily.   cholecalciferol (VITAMIN D) 1000 UNITS tablet, Take 2,000 Units by mouth daily.   magnesium gluconate (MAGONATE) 500 MG tablet, Take 1,000 mg by mouth daily.    Omega-3 Fatty Acids (FISH OIL) 1200 MG CAPS, Take 1,200 mg by mouth daily.   pantoprazole (PROTONIX) 40 MG tablet, Take 1 tablet (40 mg total) by mouth daily before breakfast.   zinc gluconate 50 MG tablet, Take 50 mg by mouth daily.  Allergies: Allergies  Allergen Reactions   Amiodarone Other (See Comments)    Lung toxicity   Bee Venom Other (See Comments)    Leg went numb    Current Problems (verified) has Hyperlipidemia associated with type 2 diabetes mellitus (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   Screening Tests: Immunization History  Administered Date(s) Administered   Influenza Split 04/05/2014   Influenza, High Dose Seasonal PF 03/31/2018, 03/20/2019, 05/07/2020   Influenza-Unspecified 02/02/2013, 05/21/2016   PFIZER(Purple Top)SARS-COV-2 Vaccination 12/13/2019, 01/03/2020, 07/09/2020   Pneumococcal Conjugate-13 05/01/2014   Pneumococcal Polysaccharide-23 05/16/2015   Td 04/25/2010, 05/07/2020   Varicella Zoster Immune Globulin 02/22/2014    Preventative care: Last colonoscopy: 2019  Prior vaccinations: TD or Tdap: 04/2020, for puncture wound, metal Influenza: 2022 Pneumococcal: 2016 Prevnar13: 2015 Shingles/Zostavax: 2015   Names of Other Physician/Practitioners you currently use: 1. [facility] here for primary care 2. Eye exam 10/2019, schedule 10/2020 3.Dentist, scheduled 09/2020   Patient Care Team: [name], MD as PCP - General (Internal Medicine) Constance Haw, MD as PCP - Electrophysiology (Cardiology) [name], MD as PCP - Cardiology  (Cardiology) Gatha Mayer, MD as Consulting Physician (Gastroenterology) [name], MD as Consulting Physician (Cardiology)  Surgical: He  has a past surgical history that includes Cardioversion (N/A, 04/05/2015); Cardioversion (N/A, 12/09/2015); Cardioversion (N/A, 03/16/2016); Cardiac catheterization (N/A, 06/11/2016); Total hip arthroplasty (Bilateral, 2001); Uvulectomy; Cardiac catheterization (N/A, 10/11/2015); Cardiac catheterization (N/A, 10/11/2015); Cardiac catheterization (N/A, 10/11/2015); Cardiac catheterization (N/A, 10/14/2015); Coronary angioplasty; Cataract extraction (Right, 2019); Esophagogastroduodenoscopy (egd) with propofol (N/A, 05/28/2020); and biopsy (05/28/2020). Family His family history includes Colon cancer in his sister; Liver disease in his brother, father, mother, and sister; Lung cancer in his father and mother; Prostate cancer in his brother. Social history  He reports that he has never smoked. He has never used smokeless tobacco. He reports that he does not drink alcohol and does not use drugs.  MEDICARE WELLNESS OBJECTIVES: Physical activity: Current Exercise Habits: The patient does not participate in regular exercise at present, Exercise limited by: None identified Cardiac risk factors: Cardiac Risk Factors include: diabetes mellitus;advanced age (>66men, >73 women);dyslipidemia;male gender;hypertension;obesity (BMI >30kg/m2);sedentary lifestyle Depression/mood screen:  Depression screen PHQ 2/9 08/05/2020  Decreased Interest 0  Down, Depressed, Hopeless 0  PHQ - 2 Score 0  Some recent data might be hidden    ADLs:  In your present state of health, do you have any difficulty performing the following activities: 08/05/2020 05/27/2020  Hearing? N N  Vision? N Y  Difficulty concentrating or making decisions? N N  Walking or climbing stairs? N N  Dressing or bathing? N N  Doing errands, shopping? N Y  Conservation officer, nature and eating ? N -  Using the Toilet? N -   In the past six months, have you accidently leaked urine? N -  Do you have problems with loss of bowel control? N -  Managing your Medications? N -  Managing your Finances? N -  Housekeeping or managing your Housekeeping? N -  Some recent data might be hidden     Cognitive Testing  Alert? Yes  Normal Appearance?Yes  Oriented to person? Yes  Place? Yes   Time? Yes  Recall of three objects?  Yes  Can perform simple calculations? Yes  Displays appropriate judgment?Yes  Can read the correct time from a watch face?Yes  EOL planning: Does Patient Have a Medical Advance Directive?: No Would patient like information on creating a medical advance directive?: No - Patient declined   Objective:   Today's Vitals   08/05/20 1523  BP: 140/70  Pulse: 65  Temp: 97.7 F (36.5 C)  SpO2: 96%  Weight: 248 lb (112.5 kg)  Height: 6' (1.829 m)   Body mass index is 33.63 kg/m.  General appearance: alert, no distress, WD/WN, male HEENT: normocephalic, sclerae anicteric, TMs pearly, nares patent, no discharge or erythema, pharynx normal Oral cavity: MMM, no lesions Neck: supple, no lymphadenopathy, no thyromegaly, no masses Heart: RRR, normal S1, S2, no murmurs Lungs: CTA bilaterally, no wheezes, rhonchi, or rales Abdomen: +bs, soft, non tender, non distended, no masses, no hepatomegaly, no splenomegaly Musculoskeletal: nontender, no swelling, no obvious deformity Extremities: no edema, no cyanosis, no clubbing Pulses: 2+ symmetric, upper and lower extremities, normal cap refill Neurological: alert, oriented x 3, CN2-12 intact, strength normal upper extremities and lower extremities, sensation normal throughout, DTRs 2+ throughout, no cerebellar signs, gait normal Psychiatric: normal affect, behavior normal, pleasant   Medicare Attestation I have personally reviewed: The patient's medical and social history Their use of alcohol, tobacco or illicit drugs Their current medications and  supplements The patient's functional ability including ADLs,fall risks, home safety risks, cognitive, and hearing and visual impairment Diet and physical activities Evidence for depression or mood disorders  The patient's weight, height, BMI, and visual acuity have been recorded in the chart.  I have made referrals, counseling, and provided education to the patient based on review of the above and I have provided the patient with a written personalized care plan for preventive services.     Garnet Sierras, Laqueta Jean, DNP [facility] 08/05/2020  4:31 PM

## 2020-08-05 NOTE — Patient Instructions (Signed)
-   skip warfarin tonight,  - try to have some greens today,  - then continue taking Warfarin 1.5 tablets daily except 1 tablet on Sundays and Thursdays.  - Recheck INR in 4 weeks.  Coumadin Clinic: [phone] Main number: [phone]

## 2020-08-06 LAB — COMPLETE METABOLIC PANEL WITH GFR
AG Ratio: 1.2 (calc) (ref 1.0–2.5)
ALT: 18 U/L (ref 9–46)
AST: 18 U/L (ref 10–35)
Albumin: 4.2 g/dL (ref 3.6–5.1)
Alkaline phosphatase (APISO): 95 U/L (ref 35–144)
BUN: 13 mg/dL (ref 7–25)
CO2: 28 mmol/L (ref 20–32)
Calcium: 9.1 mg/dL (ref 8.6–10.3)
Chloride: 104 mmol/L (ref 98–110)
Creat: 1.02 mg/dL (ref 0.70–1.18)
GFR, Est African American: 86 mL/min/{1.73_m2} (ref >=60)
GFR, Est Non African American: 74 mL/min/{1.73_m2} (ref >=60)
Globulin: 3.4 g/dL (calc) (ref 1.9–3.7)
Glucose, Bld: 107 mg/dL — ABNORMAL HIGH (ref 65–99)
Potassium: 4.3 mmol/L (ref 3.5–5.3)
Sodium: 142 mmol/L (ref 135–146)
Total Bilirubin: 0.7 mg/dL (ref 0.2–1.2)
Total Protein: 7.6 g/dL (ref 6.1–8.1)

## 2020-08-06 LAB — CBC WITH DIFFERENTIAL/PLATELET
Absolute Monocytes: 637 cells/uL (ref 200–950)
Basophils Absolute: 28 cells/uL (ref 0–200)
Basophils Relative: 0.4 %
Eosinophils Absolute: 161 cells/uL (ref 15–500)
Eosinophils Relative: 2.3 %
HCT: 42.1 % (ref 38.5–50.0)
Hemoglobin: 13.9 g/dL (ref 13.2–17.1)
Lymphs Abs: 1855 cells/uL (ref 850–3900)
MCH: 31.8 pg (ref 27.0–33.0)
MCHC: 33 g/dL (ref 32.0–36.0)
MCV: 96.3 fL (ref 80.0–100.0)
MPV: 10.2 fL (ref 7.5–12.5)
Monocytes Relative: 9.1 %
Neutro Abs: 4319 cells/uL (ref 1500–7800)
Neutrophils Relative %: 61.7 %
Platelets: 258 10*3/uL (ref 140–400)
RBC: 4.37 10*6/uL (ref 4.20–5.80)
RDW: 15.3 % — ABNORMAL HIGH (ref 11.0–15.0)
Total Lymphocyte: 26.5 %
WBC: 7 10*3/uL (ref 3.8–10.8)

## 2020-08-06 LAB — HEMOGLOBIN A1C
Hgb A1c MFr Bld: 7 % of total Hgb — ABNORMAL HIGH (ref <5.7)
Mean Plasma Glucose: 154 mg/dL
eAG (mmol/L): 8.5 mmol/L

## 2020-08-06 LAB — LIPID PANEL
Cholesterol: 109 mg/dL (ref <200)
HDL: 41 mg/dL (ref >=40)
LDL Cholesterol (Calc): 42 mg/dL (calc)
Non-HDL Cholesterol (Calc): 68 mg/dL (calc) (ref <130)
Total CHOL/HDL Ratio: 2.7 (calc) (ref <5.0)
Triglycerides: 190 mg/dL — ABNORMAL HIGH (ref <150)

## 2020-08-06 LAB — TSH: TSH: 2.91 mIU/L (ref 0.40–4.50)

## 2020-09-02 ENCOUNTER — Other Ambulatory Visit: Payer: Self-pay | Admitting: Internal Medicine

## 2020-09-02 ENCOUNTER — Ambulatory Visit (INDEPENDENT_AMBULATORY_CARE_PROVIDER_SITE_OTHER): Payer: Medicare Other | Admitting: Pharmacist

## 2020-09-02 ENCOUNTER — Other Ambulatory Visit: Payer: Self-pay

## 2020-09-02 DIAGNOSIS — Z5181 Encounter for therapeutic drug level monitoring: Secondary | ICD-10-CM | POA: Diagnosis not present

## 2020-09-02 DIAGNOSIS — I4819 Other persistent atrial fibrillation: Secondary | ICD-10-CM | POA: Diagnosis not present

## 2020-09-02 LAB — POCT INR: INR: 2.9 (ref 2.0–3.0)

## 2020-09-02 NOTE — Patient Instructions (Addendum)
-   continue taking Warfarin 1.5 tablets daily except 1 tablet on Sundays and Thursdays.  - Recheck INR in 5 weeks.  Coumadin Clinic: [phone] Main number: [phone]

## 2020-10-08 ENCOUNTER — Other Ambulatory Visit: Payer: Self-pay

## 2020-10-08 ENCOUNTER — Ambulatory Visit (INDEPENDENT_AMBULATORY_CARE_PROVIDER_SITE_OTHER): Payer: Medicare Other | Admitting: *Deleted

## 2020-10-08 DIAGNOSIS — Z5181 Encounter for therapeutic drug level monitoring: Secondary | ICD-10-CM | POA: Diagnosis not present

## 2020-10-08 DIAGNOSIS — I4819 Other persistent atrial fibrillation: Secondary | ICD-10-CM

## 2020-10-08 LAB — POCT INR: INR: 1.9 — AB (ref 2.0–3.0)

## 2020-10-08 NOTE — Patient Instructions (Addendum)
Description   -Take 2 tablets of warfarin today.  - Then continue taking Warfarin 1.5 tablets daily except 1 tablet on Sundays and Thursdays.  - Recheck INR in 4 weeks.  -Coumadin Clinic: [phone] Main number: [phone]

## 2020-10-22 ENCOUNTER — Other Ambulatory Visit: Payer: Self-pay

## 2020-10-22 MED ORDER — WARFARIN SODIUM 5 MG PO TABS: ORAL_TABLET | ORAL | 3 refills | Status: DC

## 2020-10-22 MED ORDER — PANTOPRAZOLE SODIUM 40 MG PO TBEC: 40.0000 mg | DELAYED_RELEASE_TABLET | Freq: Every day | ORAL | 0 refills | Status: DC

## 2020-11-06 ENCOUNTER — Ambulatory Visit (INDEPENDENT_AMBULATORY_CARE_PROVIDER_SITE_OTHER): Payer: Medicare Other | Admitting: Internal Medicine

## 2020-11-06 ENCOUNTER — Encounter: Payer: Self-pay | Admitting: Internal Medicine

## 2020-11-06 ENCOUNTER — Other Ambulatory Visit: Payer: Self-pay

## 2020-11-06 ENCOUNTER — Ambulatory Visit (INDEPENDENT_AMBULATORY_CARE_PROVIDER_SITE_OTHER): Payer: Medicare Other | Admitting: *Deleted

## 2020-11-06 VITALS — BP 138/84 | HR 60 | Temp 97.5°F | Resp 17 | Ht 70.0 in | Wt 247.2 lb

## 2020-11-06 DIAGNOSIS — I4819 Other persistent atrial fibrillation: Secondary | ICD-10-CM

## 2020-11-06 DIAGNOSIS — I700 Atherosclerosis of aorta: Secondary | ICD-10-CM | POA: Diagnosis not present

## 2020-11-06 DIAGNOSIS — Z0000 Encounter for general adult medical examination without abnormal findings: Secondary | ICD-10-CM

## 2020-11-06 DIAGNOSIS — Z5181 Encounter for therapeutic drug level monitoring: Secondary | ICD-10-CM | POA: Diagnosis not present

## 2020-11-06 DIAGNOSIS — I10 Essential (primary) hypertension: Secondary | ICD-10-CM

## 2020-11-06 DIAGNOSIS — Z79899 Other long term (current) drug therapy: Secondary | ICD-10-CM

## 2020-11-06 DIAGNOSIS — Z125 Encounter for screening for malignant neoplasm of prostate: Secondary | ICD-10-CM

## 2020-11-06 DIAGNOSIS — Z8249 Family history of ischemic heart disease and other diseases of the circulatory system: Secondary | ICD-10-CM | POA: Diagnosis not present

## 2020-11-06 DIAGNOSIS — E785 Hyperlipidemia, unspecified: Secondary | ICD-10-CM | POA: Diagnosis not present

## 2020-11-06 DIAGNOSIS — E039 Hypothyroidism, unspecified: Secondary | ICD-10-CM

## 2020-11-06 DIAGNOSIS — K2270 Barrett's esophagus without dysplasia: Secondary | ICD-10-CM

## 2020-11-06 DIAGNOSIS — M1A9XX0 Chronic gout, unspecified, without tophus (tophi): Secondary | ICD-10-CM

## 2020-11-06 DIAGNOSIS — N401 Enlarged prostate with lower urinary tract symptoms: Secondary | ICD-10-CM

## 2020-11-06 DIAGNOSIS — G4733 Obstructive sleep apnea (adult) (pediatric): Secondary | ICD-10-CM

## 2020-11-06 DIAGNOSIS — E559 Vitamin D deficiency, unspecified: Secondary | ICD-10-CM

## 2020-11-06 DIAGNOSIS — I2510 Atherosclerotic heart disease of native coronary artery without angina pectoris: Secondary | ICD-10-CM

## 2020-11-06 DIAGNOSIS — E1122 Type 2 diabetes mellitus with diabetic chronic kidney disease: Secondary | ICD-10-CM

## 2020-11-06 DIAGNOSIS — Z136 Encounter for screening for cardiovascular disorders: Secondary | ICD-10-CM

## 2020-11-06 DIAGNOSIS — Z0001 Encounter for general adult medical examination with abnormal findings: Secondary | ICD-10-CM

## 2020-11-06 DIAGNOSIS — I480 Paroxysmal atrial fibrillation: Secondary | ICD-10-CM

## 2020-11-06 DIAGNOSIS — Z1211 Encounter for screening for malignant neoplasm of colon: Secondary | ICD-10-CM

## 2020-11-06 DIAGNOSIS — E1169 Type 2 diabetes mellitus with other specified complication: Secondary | ICD-10-CM | POA: Diagnosis not present

## 2020-11-06 LAB — POCT INR: INR: 2 (ref 2.0–3.0)

## 2020-11-06 NOTE — Patient Instructions (Signed)
Description   Today take 2 tablets then continue taking Warfarin 1.5 tablets daily except 1 tablet on Sundays and Thursdays. Recheck INR in 4 weeks. Coumadin Clinic: [phone] Main number: [phone]

## 2020-11-06 NOTE — Progress Notes (Signed)
Annual  Screening/Preventative Visit  & Comprehensive Evaluation & Examination  Future Appointments  Date Time Provider [place]  11/06/2020  1:30 PM [facility]  11/06/2020  3:00 PM [name], MD [facility] None  08/05/2021 - Wellness  3:30 PM Garnet Sierras, NP [facility] None  11/06/2021  -  CPE  3:00 PM [name], MD [facility] None            This very nice 70 y.o. single WM presents for a Screening /Preventative Visit & comprehensive evaluation and management of multiple medical co-morbidities.  Patient has been followed for HTN, ASHD/pAfib, HLD, T2_NIDDM  and Vitamin D Deficiency. Patient has OSA , but was mask intolerant. Patient is followed by Dr Carlean Purl for Barrett's Esophagus.       HTN predates circa 2006. Patient's BP has been controlled at home.  Today's BP was initially slightly elevated and rechecked at goal - 138/84.  He was dx'd with Afib in 2016 and in  2016 & 2017, he failed CV x 2.   In 2018, he had an Ablation. He was treated with Amiodarione which was stopped when he developed Pulmonary toxicity.  Patient is on Coumadin managed  at [facility] .Patient denies any cardiac symptoms as chest pain, palpitations, shortness of breath, dizziness or ankle swelling.       Patient's hyperlipidemia is controlled with diet and medications. Patient denies myalgias or other medication SE's. Last lipids were at goal except sl elevated Trig's:  Lab Results  Component Value Date   CHOL 109 08/05/2020   HDL 41 08/05/2020   LDLCALC 42 08/05/2020   TRIG 190 (H) 08/05/2020   CHOLHDL 2.7 08/05/2020        Patient has Morbid Obesity  (BMI 35+) and consequent T2_NIDDM (2012) w/CKD2  (GFR 74) and patient denies reactive hypoglycemic symptoms, visual blurring, diabetic polys or paresthesias. Last A1c was not at goal:   Lab Results  Component Value Date   HGBA1C 7.0 (H) 08/05/2020         Patient has been on  Thyroid Replacement since 2011.   Finally, patient has history of Vitamin D Deficiency ("23" / 2009) and last vitamin D was near goal  (70-100):   Lab Results  Component Value Date   VD25OH 56 05/07/2020                   Current Outpatient Medications on File Prior to Visit  Medication Sig   acetaminophen 325 MG tablet Take 650 mg  every 6 hours as needed for mild pain.   allopurinol 300 MG tablet TAKE 1 TABLET DAILY    amLODipine 10 MG tablet Take 1 tab Daily For BP & Heart   VITAMIN C  500 MG tablet Take daily.   atorvastatin 80 MG tablet Take 40 mg daily.   B Complex Vitamins  Take 1 tablet daily.   carvedilol (COREG) 12.5 MG tablet TAKE 2 TABLETS  TWICE DAILY    VITAMIN D  1000 UNITS tablet Take 2,000 Units by mouth daily.   colchicine 0.6 MG tablet Take 1 tablet daily to prevent Gout Attacks   Levothyroxine  100 MCG tablet TAKE 1 TABLET DAILY    loratadine  10 MG tablet Take 10 mg by mouth daily.   magnesium  500 MG tablet Take 1,000 mg by mouth daily.    metFORMIN-XR 500 MG 24 hr tablet Take  2 tablets  2 x /day with Meals     Olmesartan  40 MG tablet Take 1 tablet Daily for BP   Omega-3 FISH OIL 1200 MG CAPS Take 1,200 mg by mouth daily.   pantoprazole  40 MG tablet Take 1 tablet  daily before breakfast.   COUMADIN 5 MG tablet Take 1 tablet on Thurs & Sun  and 1.5 tablets all other days.   zinc 50 MG tablet Take  daily.    Allergies  Allergen Reactions   Amiodarone Other (See Comments)    Lung toxicity   Bee Venom Other (See Comments)    Leg went numb    Past Medical History:  Diagnosis Date   AKI (acute kidney injury) ([place]) 05/27/2020   Arthritis    "joints get stiff" (06/11/2016)   Bee sting allergy    Bee sting allergy    CAD (coronary artery disease), native coronary artery 02/12/2016   cath showing 50% LM, 40% distal LAD, 70% ost to prox LCs, 40% mid LAD and 95% mid to dist RCA s/p PCI with DES x 2 and stage PCI of the LCx/OM.   Carotid artery stenosis    1-39% bilateral by dopplers 08/2017    History of gout    Hx of adenomatous colonic polyps 04/10/2008   Hyperlipidemia    Hypertension    Hypothyroidism    OSA (obstructive sleep apnea)    "tried CPAP; couldn't do it" (06/11/2016)   Persistent atrial fibrillation ([place])    s/p DCCV 03/2015 with reversion back to atrial fibrillation, loaded with Amio and s/p DCCV to NSR 11/2015.  He is now s/p afib albation.  He had amiodarone  lung toxicity and it was stopped.   Thyroid disease    Type II diabetes mellitus ([place])    Vitamin D deficiency      Health Maintenance  Topic Date Due   Zoster Vaccines- Shingrix (1 of 2) Never done   COVID-19 Vaccine (4 - Booster for Pfizer series) 10/06/2020   FOOT EXAM  10/31/2020   INFLUENZA VACCINE  12/23/2020   OPHTHALMOLOGY EXAM  01/03/2021   HEMOGLOBIN A1C  02/05/2021   COLONOSCOPY  10/06/2022   TETANUS/TDAP  05/07/2030   Hepatitis C Screening  Completed   PNA vac Low Risk Adult  Completed   HPV VACCINES  Aged Out     Immunization History  Administered Date(s) Administered   Influenza Split 04/05/2014   Influenza, High Dose  03/31/2018, 03/20/2019, 05/07/2020   Influenza 02/02/2013, 05/21/2016   PFIZER SARS-COV-2 Vacc 12/13/2019, 01/03/2020, 07/09/2020   Pneumococcal-13 05/01/2014   Pneumococcal -23 05/16/2015   Td 04/25/2010, 05/07/2020   Varicella Zoster Immune Globulin 02/22/2014    Last Colon - 10/05/2017 - Dr Carlean Purl recc 5 yr f/u due May 2024   Past Surgical History:  Procedure Laterality Date   BIOPSY  05/28/2020   Procedure: BIOPSY;  Surgeon: Jerene Bears, MD;  Location: [facility];  Service: Gastroenterology;;   CARDIAC CATHETERIZATION N/A 10/11/2015   Procedure: Left Heart Cath and Coronary Angiography;  Surgeon: [name], MD;  Location: [place];  Service: Cardiovascular;  Laterality: N/A;   CARDIAC CATHETERIZATION N/A 10/11/2015   Procedure: Coronary Stent Intervention;  Surgeon: [name], MD;  Location: [place] [facility];  Service:  Cardiovascular;  Laterality: N/A;   CARDIAC CATHETERIZATION N/A 10/11/2015   Procedure: Intravascular Pressure Wire/FFR Study;  Surgeon: [name], MD;  Location: [place];  Service: Cardiovascular;  Laterality: N/A;   CARDIAC CATHETERIZATION N/A 10/14/2015   Procedure: Coronary Stent  Intervention;  Surgeon: [name], MD;  Location: [place] [facility];  Service: Cardiovascular;  Laterality: N/A;   CARDIOVERSION N/A 04/05/2015   Procedure: CARDIOVERSION;  Surgeon: [name], MD;  Location: [place];  Service: Cardiovascular;  Laterality: N/A;   CARDIOVERSION N/A 12/09/2015   Procedure: CARDIOVERSION;  Surgeon: [name], MD;  Location: [facility];  Service: Cardiovascular;  Laterality: N/A;   CARDIOVERSION N/A 03/16/2016   Procedure: CARDIOVERSION;  Surgeon: [name], MD;  Location: [facility];  Service: Cardiovascular;  Laterality: N/A;   CATARACT EXTRACTION Right 2019   Dr. Patrici Ranks   CORONARY ANGIOPLASTY     ELECTROPHYSIOLOGIC STUDY N/A 06/11/2016   Procedure: Atrial Fibrillation Ablation;  Surgeon: Will [name], MD;  Location: [place];  Service: Cardiovascular;  Laterality: N/A;   ESOPHAGOGASTRODUODENOSCOPY (EGD) WITH PROPOFOL N/A 05/28/2020   Procedure: ESOPHAGOGASTRODUODENOSCOPY (EGD) WITH PROPOFOL;  Surgeon: Jerene Bears, MD;  Location: [place];  Service: Gastroenterology;  Laterality: N/A;   TOTAL HIP ARTHROPLASTY Bilateral 2001   UVULECTOMY       Family History  Problem Relation Age of Onset   Lung cancer Mother    Liver disease Mother    Lung cancer Father    Liver disease Father    Colon cancer Sister    Liver disease Sister    Prostate cancer Brother    Liver disease Brother    Stomach cancer Neg Hx    Pancreatic cancer Neg Hx    Esophageal cancer Neg Hx     Social History   Socioeconomic History   Marital status: Single    Spouse name: Not on file   Number of children: Not on file  Occupational  History     Tobacco Use   Smoking status: Never   Smokeless tobacco: Never  Vaping Use   Vaping Use: Never used  Substance and Sexual Activity   Alcohol use: No   Drug use: No   Sexual activity: Not Currently  Social History Narrative   He is single, he has been in [employer] for his whole life.     Denies alcohol tobacco or drug use      ROS Constitutional: Denies fever, chills, weight loss/gain, headaches, insomnia,  night sweats or change in appetite. Does c/o fatigue. Eyes: Denies redness, blurred vision, diplopia, discharge, itchy or watery eyes.  ENT: Denies discharge, congestion, post nasal drip, epistaxis, sore throat, earache, hearing loss, dental pain, Tinnitus, Vertigo, Sinus pain or snoring.  Cardio: Denies chest pain, palpitations, irregular heartbeat, syncope, dyspnea, diaphoresis, orthopnea, PND, claudication or edema Respiratory: denies cough, dyspnea, DOE, pleurisy, hoarseness, laryngitis or wheezing.  Gastrointestinal: Denies dysphagia, heartburn, reflux, water brash, pain, cramps, nausea, vomiting, bloating, diarrhea, constipation, hematemesis, melena, hematochezia, jaundice or hemorrhoids Genitourinary: Denies dysuria, frequency, urgency, nocturia, hesitancy, discharge, hematuria or flank pain Musculoskeletal: Denies arthralgia, myalgia, stiffness, Jt. Swelling, pain, limp or strain/sprain. Denies Falls. Skin: Denies puritis, rash, hives, warts, acne, eczema or change in skin lesion Neuro: No weakness, tremor, incoordination, spasms, paresthesia or pain Psychiatric: Denies confusion, memory loss or sensory loss. Denies Depression. Endocrine: Denies change in weight, skin, hair change, nocturia, and paresthesia, diabetic polys, visual blurring or hyper / hypo glycemic episodes.  Heme/Lymph: No excessive bleeding, bruising or enlarged lymph nodes.   Physical Exam  BP 138/84   Pulse 60   Temp (!) 97.5 F (36.4 C)   Resp 17   Ht 5\' 10"  (1.778 m)   Wt 247 lb  3.2  oz (112.1 kg)   SpO2 94%   BMI 35.47 kg/m   General Appearance: Well nourished and well groomed and in no apparent distress.  Eyes: PERRLA, EOMs, conjunctiva no swelling or erythema, normal fundi and vessels. Sinuses: No frontal/maxillary tenderness ENT/Mouth: EACs patent / TMs  nl. Nares clear without erythema, swelling, mucoid exudates. Oral hygiene is good. No erythema, swelling, or exudate. Tongue normal, non-obstructing. Tonsils not swollen or erythematous. Hearing normal.  Neck: Supple, thyroid not palpable. No bruits, nodes or JVD. Respiratory: Respiratory effort normal.  BS equal and clear bilateral without rales, rhonci, wheezing or stridor. Cardio: Heart sounds are normal with regular rate and rhythm and no murmurs, rubs or gallops. Peripheral pulses are normal and equal bilaterally without edema. No aortic or femoral bruits. Chest: symmetric with normal excursions and percussion.  Abdomen: Soft, with Nl bowel sounds. Nontender, no guarding, rebound, hernias, masses, or organomegaly.  Lymphatics: Non tender without lymphadenopathy.  Musculoskeletal: Full ROM all peripheral extremities, joint stability, 5/5 strength, and normal gait. Skin: Warm and dry without rashes, lesions, cyanosis, clubbing or  ecchymosis.  Neuro: Cranial nerves intact, reflexes equal bilaterally. Normal muscle tone, no cerebellar symptoms. Sensation intact.  Pysch: Alert and oriented X 3 with normal affect, insight and judgment appropriate.   Assessment and Plan  1. Annual Preventative/Screening Exam    2. Essential hypertension  - Microalbumin / creatinine urine ratio - POC Hemoccult Bld/Stl (3-Cd Home Screen); Future - CBC with Differential/Platelet - COMPLETE METABOLIC PANEL WITH GFR - Magnesium - TSH - EKG 12-Lead - [place], RETROPERITNL ABD,  LTD  3. Hyperlipidemia associated with type 2 diabetes mellitus ([place])  - Lipid panel - TSH - EKG 12-Lead - [place], RETROPERITNL ABD,  LTD  4. Type 2  diabetes mellitus with stage 2 chronic kidney disease, without long-term current use of insulin (HCC)  - HM DIABETES FOOT EXAM - LOW EXTREMITY NEUR EXAM DOCUM - Hemoglobin A1c - Insulin, random - EKG 12-Lead - [place], RETROPERITNL ABD,  LTD  5. Vitamin D deficiency  - VITAMIN D 25 Hydroxy   6. Hypothyroidism, unspecified type  - TSH  7. Paroxysmal atrial fibrillation (HCC)  - TSH - EKG 12-Lead  8. Aortic atherosclerosis ([place]) by Chest CT scan in 2018  - EKG 12-Lead - [place], RETROPERITNL ABD,  LTD  9. Coronary artery disease involving native coronary  artery of native heart without angina pectoris  - EKG 12-Lead  10. Chronic gout   - Uric acid  11. Barrett's esophagus without dysplasia   12. OSA (obstructive sleep apnea)   13. BPH with obstruction/lower urinary tract symptoms  - PSA  14. Screening for colorectal cancer  - POC Hemoccult Bld/Stl  15. Prostate cancer screening  - PSA  16. Screening for ischemic heart disease  - EKG 12-Lead  17. FH: hypertension  - EKG 12-Lead - [place], RETROPERITNL ABD,  LTD  18. Screening for AAA (aortic abdominal aneurysm)  - [place], RETROPERITNL ABD,  LTD  19. Medication management  - Uric acid - CBC with Differential/Platelet - COMPLETE METABOLIC PANEL WITH GFR - Magnesium - Lipid panel - TSH - Hemoglobin A1c - Insulin, random - VITAMIN D 25 Hydroxy          Patient was counseled in prudent diet, weight control to achieve/maintain BMI less than 25, BP monitoring, regular exercise and medications as discussed.  Discussed med effects and SE's. Routine screening labs and tests as requested with regular follow-up as recommended. Over 40 minutes of exam, counseling,  chart review and high complex critical decision making was performed   Kirtland Bouchard, MD

## 2020-11-06 NOTE — Patient Instructions (Signed)

## 2020-11-07 LAB — CBC WITH DIFFERENTIAL/PLATELET
Absolute Monocytes: 720 cells/uL (ref 200–950)
Basophils Absolute: 30 cells/uL (ref 0–200)
Basophils Relative: 0.4 %
Eosinophils Absolute: 150 cells/uL (ref 15–500)
Eosinophils Relative: 2 %
HCT: 41 % (ref 38.5–50.0)
Hemoglobin: 13.7 g/dL (ref 13.2–17.1)
Lymphs Abs: 1545 cells/uL (ref 850–3900)
MCH: 31.6 pg (ref 27.0–33.0)
MCHC: 33.4 g/dL (ref 32.0–36.0)
MCV: 94.5 fL (ref 80.0–100.0)
MPV: 10.5 fL (ref 7.5–12.5)
Monocytes Relative: 9.6 %
Neutro Abs: 5055 cells/uL (ref 1500–7800)
Neutrophils Relative %: 67.4 %
Platelets: 234 10*3/uL (ref 140–400)
RBC: 4.34 10*6/uL (ref 4.20–5.80)
RDW: 15.8 % — ABNORMAL HIGH (ref 11.0–15.0)
Total Lymphocyte: 20.6 %
WBC: 7.5 10*3/uL (ref 3.8–10.8)

## 2020-11-07 LAB — LIPID PANEL
Cholesterol: 94 mg/dL (ref <200)
HDL: 32 mg/dL — ABNORMAL LOW (ref >=40)
LDL Cholesterol (Calc): 38 mg/dL (calc)
Non-HDL Cholesterol (Calc): 62 mg/dL (calc) (ref <130)
Total CHOL/HDL Ratio: 2.9 (calc) (ref <5.0)
Triglycerides: 163 mg/dL — ABNORMAL HIGH (ref <150)

## 2020-11-07 LAB — HEMOGLOBIN A1C
Hgb A1c MFr Bld: 6.6 % of total Hgb — ABNORMAL HIGH (ref <5.7)
Mean Plasma Glucose: 143 mg/dL
eAG (mmol/L): 7.9 mmol/L

## 2020-11-07 LAB — COMPLETE METABOLIC PANEL WITH GFR
AG Ratio: 1.2 (calc) (ref 1.0–2.5)
ALT: 17 U/L (ref 9–46)
AST: 15 U/L (ref 10–35)
Albumin: 4.2 g/dL (ref 3.6–5.1)
Alkaline phosphatase (APISO): 97 U/L (ref 35–144)
BUN: 16 mg/dL (ref 7–25)
CO2: 28 mmol/L (ref 20–32)
Calcium: 9.1 mg/dL (ref 8.6–10.3)
Chloride: 102 mmol/L (ref 98–110)
Creat: 1.06 mg/dL (ref 0.70–1.18)
GFR, Est African American: 82 mL/min/{1.73_m2} (ref >=60)
GFR, Est Non African American: 71 mL/min/{1.73_m2} (ref >=60)
Globulin: 3.4 g/dL (calc) (ref 1.9–3.7)
Glucose, Bld: 110 mg/dL — ABNORMAL HIGH (ref 65–99)
Potassium: 4.1 mmol/L (ref 3.5–5.3)
Sodium: 140 mmol/L (ref 135–146)
Total Bilirubin: 0.8 mg/dL (ref 0.2–1.2)
Total Protein: 7.6 g/dL (ref 6.1–8.1)

## 2020-11-07 LAB — MAGNESIUM: Magnesium: 1.6 mg/dL (ref 1.5–2.5)

## 2020-11-07 LAB — INSULIN, RANDOM: Insulin: 11.4 u[IU]/mL

## 2020-11-07 LAB — PSA: PSA: 0.37 ng/mL (ref <=4.00)

## 2020-11-07 LAB — URIC ACID: Uric Acid, Serum: 4.5 mg/dL (ref 4.0–8.0)

## 2020-11-07 LAB — TSH: TSH: 2.5 mIU/L (ref 0.40–4.50)

## 2020-11-07 LAB — MICROALBUMIN / CREATININE URINE RATIO
Creatinine, Urine: 138 mg/dL (ref 20–320)
Microalb Creat Ratio: 31 mcg/mg creat — ABNORMAL HIGH (ref <30)
Microalb, Ur: 4.3 mg/dL

## 2020-11-07 LAB — VITAMIN D 25 HYDROXY (VIT D DEFICIENCY, FRACTURES): Vit D, 25-Hydroxy: 48 ng/mL (ref 30–100)

## 2020-11-17 ENCOUNTER — Encounter: Payer: Self-pay | Admitting: Internal Medicine

## 2020-12-02 ENCOUNTER — Telehealth: Payer: Self-pay | Admitting: *Deleted

## 2020-12-02 NOTE — Telephone Encounter (Signed)
   [facility] Pre-operative Risk Assessment    Patient Name: Christopher Wade  DOB: 07-21-1950 MRN: [id number]  HEARTCARE STAFF:  - IMPORTANT!!!!!! Under Visit Info/Reason for Call, type in Other and utilize the format Clearance MM/DD/YY or Clearance TBD. Do not use dashes or single digits. - Please review there is not already an duplicate clearance open for this procedure. - If request is for dental extraction, please clarify the # of teeth to be extracted. - If the patient is currently at the dentist's office, call Pre-Op Callback Staff (MA/nurse) to input urgent request.  - If the patient is not currently in the dentist office, please route to the Pre-Op pool.  Request for surgical clearance:  What type of surgery is being performed? CALLED DENTAL OFFICE TO CONFIRM HOW MANY TEETH TO BE EXTRACTED, STATES PT HAVING CLEANING AS WELL  When is this surgery scheduled? 12/04/20 ?  What type of clearance is required (medical clearance vs. Pharmacy clearance to hold med vs. Both)? PHARM  Are there any medications that need to be held prior to surgery and how long? WARFARIN  Practice name and name of physician performing surgery? [facility]  What is the office phone number? [phone]   7.   What is the office fax number? [phone]  8.   Anesthesia type (None, local, MAC, general) ? NEED TYPE OF ANESTHESIA TO BE USED IF ANY   Julaine Hua 12/02/2020, 3:19 PM  _________________________________________________________________   (provider comments below)

## 2020-12-03 NOTE — Telephone Encounter (Signed)
   Patient Name: Christopher Wade  DOB: 12/28/1950 MRN: [id number]  Primary Cardiologist: Fransico Him, MD  Chart reviewed as part of pre-operative protocol coverage.   Simple dental extractions defined as 1 or 2 teeth are considered low risk procedures per guidelines and generally do not require any specific cardiac clearance. It is also generally accepted that for simple extractions and dental cleanings, there is no need to interrupt blood thinner therapy.   SBE prophylaxis is not required for the patient from a cardiac standpoint.  I will route this recommendation to the requesting party via Epic fax function and remove from pre-op pool.  Please call with questions.  Tami Lin Bayla Mcgovern, PA 12/03/2020, 1:56 PM     Return call from dental office to clarify procedure.   Pt needs one tooth extraction.

## 2020-12-04 ENCOUNTER — Ambulatory Visit: Payer: Medicare Other | Admitting: *Deleted

## 2020-12-04 ENCOUNTER — Other Ambulatory Visit: Payer: Self-pay

## 2020-12-04 DIAGNOSIS — I4819 Other persistent atrial fibrillation: Secondary | ICD-10-CM | POA: Diagnosis not present

## 2020-12-04 DIAGNOSIS — Z5181 Encounter for therapeutic drug level monitoring: Secondary | ICD-10-CM | POA: Diagnosis not present

## 2020-12-04 LAB — POCT INR: INR: 3.5 — AB (ref 2.0–3.0)

## 2020-12-04 NOTE — Patient Instructions (Signed)
Description   Do not take Warfarin today then continue taking Warfarin 1.5 tablets daily except 1 tablet on Sundays and Thursdays. Recheck INR in 3 weeks. Coumadin Clinic: [phone] Main number: [phone]

## 2020-12-25 ENCOUNTER — Ambulatory Visit: Payer: Medicare Other

## 2020-12-25 ENCOUNTER — Other Ambulatory Visit: Payer: Self-pay

## 2020-12-25 DIAGNOSIS — Z5181 Encounter for therapeutic drug level monitoring: Secondary | ICD-10-CM

## 2020-12-25 DIAGNOSIS — I4819 Other persistent atrial fibrillation: Secondary | ICD-10-CM | POA: Diagnosis not present

## 2020-12-25 LAB — POCT INR: INR: 3 (ref 2.0–3.0)

## 2020-12-25 NOTE — Patient Instructions (Signed)
Description   Eat some greens tonight and then continue taking Warfarin 1.5 tablets daily except 1 tablet on Sundays and Thursdays. Recheck INR in 4 weeks. Coumadin Clinic: [phone] Main number: [phone]

## 2020-12-26 ENCOUNTER — Other Ambulatory Visit: Payer: Self-pay | Admitting: Internal Medicine

## 2020-12-26 DIAGNOSIS — I10 Essential (primary) hypertension: Secondary | ICD-10-CM

## 2020-12-26 MED ORDER — AMLODIPINE BESYLATE 10 MG PO TABS: ORAL_TABLET | ORAL | 3 refills | Status: DC

## 2021-01-06 ENCOUNTER — Other Ambulatory Visit: Payer: Self-pay

## 2021-01-06 ENCOUNTER — Other Ambulatory Visit: Payer: Self-pay | Admitting: Adult Health

## 2021-01-06 ENCOUNTER — Other Ambulatory Visit: Payer: Self-pay | Admitting: Internal Medicine

## 2021-01-06 DIAGNOSIS — E039 Hypothyroidism, unspecified: Secondary | ICD-10-CM

## 2021-01-06 DIAGNOSIS — N182 Chronic kidney disease, stage 2 (mild): Secondary | ICD-10-CM

## 2021-01-06 DIAGNOSIS — I10 Essential (primary) hypertension: Secondary | ICD-10-CM

## 2021-01-06 DIAGNOSIS — M1A9XX0 Chronic gout, unspecified, without tophus (tophi): Secondary | ICD-10-CM

## 2021-01-06 MED ORDER — OLMESARTAN MEDOXOMIL 40 MG PO TABS: ORAL_TABLET | ORAL | 0 refills | Status: DC

## 2021-01-14 DIAGNOSIS — H04123 Dry eye syndrome of bilateral lacrimal glands: Secondary | ICD-10-CM | POA: Diagnosis not present

## 2021-01-14 DIAGNOSIS — H02831 Dermatochalasis of right upper eyelid: Secondary | ICD-10-CM | POA: Diagnosis not present

## 2021-01-14 DIAGNOSIS — E119 Type 2 diabetes mellitus without complications: Secondary | ICD-10-CM | POA: Diagnosis not present

## 2021-01-14 DIAGNOSIS — H02834 Dermatochalasis of left upper eyelid: Secondary | ICD-10-CM | POA: Diagnosis not present

## 2021-01-14 LAB — HM DIABETES EYE EXAM

## 2021-01-22 ENCOUNTER — Encounter (INDEPENDENT_AMBULATORY_CARE_PROVIDER_SITE_OTHER): Payer: Self-pay

## 2021-01-22 ENCOUNTER — Other Ambulatory Visit: Payer: Self-pay

## 2021-01-22 ENCOUNTER — Ambulatory Visit: Payer: Medicare Other

## 2021-01-22 DIAGNOSIS — I4819 Other persistent atrial fibrillation: Secondary | ICD-10-CM

## 2021-01-22 DIAGNOSIS — Z5181 Encounter for therapeutic drug level monitoring: Secondary | ICD-10-CM

## 2021-01-22 LAB — POCT INR: INR: 2.6 (ref 2.0–3.0)

## 2021-01-22 NOTE — Patient Instructions (Signed)
Description   Continue taking Warfarin 1.5 tablets daily except 1 tablet on Sundays and Thursdays. Recheck INR in 4 weeks. Coumadin Clinic: [phone] Main number: [phone]

## 2021-02-07 NOTE — Progress Notes (Signed)
3 MONTH FOLLOW UP  Assessment and Plan:   Atherosclerosis of aorta Control blood pressure, cholesterol, glucose, increase exercise.   Afib rate controlled Doing well s/p ablation off AAD therapy Continue coumadin via cardiology for [facility] of 4 Continue following up with cardio still unable to tolerate CPAP due to dryness.   CAD Control blood pressure, cholesterol, glucose, increase exercise.  Followed by cardiology  Hypertension Typically well controlled, last in office 120s, defer dose adjustment, recheck at next OV Monitor blood pressure at home; patient to call if consistently greater than 130/80 Continue DASH diet.   Reminder to go to the ER if any CP, SOB, nausea, dizziness, severe HA, changes vision/speech, left arm numbness and tingling and jaw pain. CBC CMP  Cholesterol Currently at LDL goal; continue statin - reduce from Lipitor 80 mg to 40 mg if remains aggressively controlled; diet for trigs discussed Continue low cholesterol diet and exercise.  Check lipid panel.   Hypothyroidism TSH Continue Levothyroxine 100 mcg daily on empty stomach with water, wait 30 minutes for other meds and food.    Diabetes with diabetic chronic kidney disease Continue medication: metformin 2000 mg daily  Continue diet and exercise.  Perform daily foot/skin check, notify office of any concerning changes.  Check A1C Microalbumin/ creatinine ratio urine  Morbid obesity - BMi 30 + with OSA Long discussion about weight loss, diet, and exercise Recommended diet heavy in fruits and veggies and low in animal meats, cheeses, and dairy products, appropriate calorie intake Discussed ideal weight for height, goal <225lb Patient will work on eating more frequent small, meals, restart exercise with cooler weather, continue to monitor weight at least weekly  Will follow up in 3 months  Vitamin D Def At goal at last visit; continue supplementation to maintain goal of 70-100 Defer Vit D  level  Gout Continue allopurinol Diet discussed Check uric acid as needed  Chronic use of anticoagulant PT/INR Continue Coumadin '5mg'$  1.5 tab PO QD , 1 tab Thursday and Sunday   Continue diet and meds as discussed. Further disposition pending results of labs. Discussed med's effects and SE's.   Over 30 minutes of exam, counseling, chart review, and critical decision making was performed.   Future Appointments  Date Time Provider [place]  02/19/2021  1:45 PM [facility]  04/10/2021  3:00 PM [name], MD [facility]  05/13/2021  3:30 PM [name], MD [facility] None  07/01/2021  2:15 PM Constance Haw, MD [facility]  08/14/2021  2:30 PM Liane Comber, NP [facility] None  11/06/2021  3:00 PM [name], MD [facility] None    ----------------------------------------------------------------------------------------------------------------------  HPI 70 y.o. male  presents for 3 month follow up on hypertension, cholesterol, diabetes, hypothyroid, morbid obesity and vitamin D deficiency.   Patient has OSA but is intolerant to the mask; weight loss has been emphasized to patient without much progress.   Patient has history of Afib (2016) and had CV x 2 (2016 & 2017) and then Jan 2018 had an Afib Ablation (Dr Curt Bears) without noted recurrence, does remain on coumadin managed by coumadin clinic (couldn't afford xarelto) followed by Dr Fransico Him.  He has ILD followed by Dr. Chase Caller with annual monitoring.   BMI is Body mass index is 36.42 kg/m., he has been working on diet and exercise, not walking as much due to heat making him short of breath,  pushing vegetable intake. His weight is down from 270 lb, he has been pushing  vegetable intake.  Wt Readings from Last 3 Encounters:  02/10/21 253 lb 12.8 oz (115.1 kg)  11/06/20 247 lb 3.2 oz (112.1 kg)  08/05/20 248 lb (112.5 kg)    He  has aortic atherosclerosis per CT chest in 2018.  His blood pressure has been controlled at home (typically 120-130s/70s), today their BP is BP: 140/78 He has started takling his carvedilol 2 at 6 am 1 at 1 pm and 1 at 6 PM states BP's are more regulated.  He does workout. He denies chest pain, shortness of breath, dizziness.   He is on cholesterol medication Atorvastatin 80 mg daily and denies myalgias. His cholesterol is at goal. The cholesterol last visit was:   Lab Results  Component Value Date   CHOL 94 11/06/2020   HDL 32 (L) 11/06/2020   LDLCALC 38 11/06/2020   TRIG 163 (H) 11/06/2020   CHOLHDL 2.9 11/06/2020    He has been working on diet and exercise for T2 diabetes on metformin 500 mg 2 BID, and denies foot ulcerations, increased appetite, nausea, paresthesia of the feet, polydipsia and polyuria. He does check fasting sugars, ranging 110-122. Last A1C in the office was:  Lab Results  Component Value Date   HGBA1C 6.6 (H) 11/06/2020    He has CKD II associated with T2DM and htn monitored at this office; on ARB;  Lab Results  Component Value Date   GFRNONAA 71 11/06/2020   He is on thyroid medication. His medication was not changed last visit, taking 100 mcg daily Lab Results  Component Value Date   TSH 2.50 11/06/2020   Patient is on Vitamin D supplement.   Lab Results  Component Value Date   VD25OH 48 11/06/2020     Patient is on allopurinol for gout and does not report a recent flare.  Lab Results  Component Value Date   LABURIC 4.5 11/06/2020     Current Medications:  Current Outpatient Medications on File Prior to Visit  Medication Sig   acetaminophen (TYLENOL) 325 MG tablet Take 650 mg by mouth every 6 (six) hours as needed for mild pain.   allopurinol (ZYLOPRIM) 300 MG tablet TAKE 1 TABLET BY MOUTH DAILY FOR GOUT PREVENTION   amLODipine (NORVASC) 10 MG tablet Take 1 tab  Daily For BP & Heart   Ascorbic Acid (VITAMIN C WITH ROSE HIPS) 500 MG tablet Take  500 mg by mouth daily.   atorvastatin (LIPITOR) 80 MG tablet Take 40 mg by mouth daily.   B Complex Vitamins (B COMPLEX PO) Take 1 tablet by mouth daily.   carvedilol (COREG) 12.5 MG tablet TAKE 2 TABLETS BY MOUTH TWICE DAILY FOR BLOOD PRESSURE AND HEART   cholecalciferol (VITAMIN D) 1000 UNITS tablet Take 2,000 Units by mouth daily.   levothyroxine (SYNTHROID) 100 MCG tablet TAKE 1 TABLET DAILY ON EMPTY STOMACH WITH WATER FOR 30 MINS& NO ANTACID MEDS, CALCIUM, OR MAGNESIUM FOR 4 HRS& AVOID BIOTIN   loratadine (CLARITIN) 10 MG tablet Take 10 mg by mouth daily.   magnesium gluconate (MAGONATE) 500 MG tablet Take 1,000 mg by mouth daily.    metFORMIN (GLUCOPHAGE-XR) 500 MG 24 hr tablet TAKE 2 TABLETS BY MOUTH TWICE DAILY WITH MEALS FOR DIABETES   olmesartan (BENICAR) 40 MG tablet Take 1 tablet Daily for BP   Omega-3 Fatty Acids (FISH OIL) 1200 MG CAPS Take 1,200 mg by mouth daily.   warfarin (COUMADIN) 5 MG tablet Take 1 tablet on Thursdays and Sundays and 1.5 tablets all  other days.   zinc gluconate 50 MG tablet Take 50 mg by mouth daily.   colchicine 0.6 MG tablet Take 1 tablet daily to prevent Gout Attacks (Patient not taking: Reported on 11/06/2020)   pantoprazole (PROTONIX) 40 MG tablet Take 1 tablet (40 mg total) by mouth daily before breakfast.   No current facility-administered medications on file prior to visit.     Allergies:  Allergies  Allergen Reactions   Amiodarone Other (See Comments)    Lung toxicity   Bee Venom Other (See Comments)    Leg went numb     Medical History:  Past Medical History:  Diagnosis Date   AKI (acute kidney injury) ([place]) 05/27/2020   Arthritis    "joints get stiff" (06/11/2016)   Bee sting allergy    Bee sting allergy    CAD (coronary artery disease), native coronary artery 02/12/2016   cath showing 50% LM, 40% distal LAD, 70% ost to prox LCs, 40% mid LAD and 95% mid to dist RCA s/p PCI with DES x 2 and stage PCI of the LCx/OM.   Carotid artery  stenosis    1-39% bilateral by dopplers 08/2017   History of gout    Hx of adenomatous colonic polyps 04/10/2008   Hyperlipidemia    Hypertension    Hypothyroidism    OSA (obstructive sleep apnea)    "tried CPAP; couldn't do it" (06/11/2016)   Persistent atrial fibrillation ([place])    s/p DCCV 03/2015 with reversion back to atrial fibrillation, loaded with Amio and s/p DCCV to NSR 11/2015.  He is now s/p afib albation.  He had amio lung toxicity and amio was stopped.   Thyroid disease    Type II diabetes mellitus ([place])    Vitamin D deficiency    Family history- Reviewed and unchanged Social history- Reviewed and unchanged   Review of Systems:  Review of Systems  Constitutional:  Negative for malaise/fatigue and weight loss.  HENT:  Negative for hearing loss and tinnitus.   Eyes:  Negative for blurred vision and double vision.  Respiratory:  Negative for cough, shortness of breath and wheezing.   Cardiovascular:  Negative for chest pain, palpitations, orthopnea, claudication and leg swelling.  Gastrointestinal:  Negative for abdominal pain, blood in stool, constipation, diarrhea, heartburn, melena, nausea and vomiting.  Genitourinary: Negative.   Musculoskeletal:  Negative for joint pain and myalgias.  Skin:  Negative for rash.  Neurological:  Negative for dizziness, tingling, sensory change, weakness and headaches.  Endo/Heme/Allergies:  Negative for polydipsia.  Psychiatric/Behavioral: Negative.    All other systems reviewed and are negative.    Physical Exam: BP 140/78   Pulse 65   Temp (!) 97.5 F (36.4 C)   Wt 253 lb 12.8 oz (115.1 kg)   SpO2 95%   BMI 36.42 kg/m  Wt Readings from Last 3 Encounters:  02/10/21 253 lb 12.8 oz (115.1 kg)  11/06/20 247 lb 3.2 oz (112.1 kg)  08/05/20 248 lb (112.5 kg)   General Appearance: Well nourished, in no apparent distress. Eyes: PERRLA, EOMs, conjunctiva no swelling or erythema Sinuses: No Frontal/maxillary tenderness ENT/Mouth:  Ext aud canals clear, TMs without erythema, bulging. No erythema, swelling, or exudate on post pharynx.  Tonsils not swollen or erythematous. Hearing normal.  Neck: Supple, thyroid normal.  Respiratory: Respiratory effort normal, BS equal bilaterally without rales, rhonchi, wheezing or stridor.  Cardio: RRR with no MRGs. Brisk peripheral pulses, scant edema bil ankles Abdomen: Soft, + BS.  Non tender, no  guarding, rebound, hernias, masses. Lymphatics: Non tender without lymphadenopathy.  Musculoskeletal: Full ROM, 5/5 strength, normal gait Skin: Warm, dry without rashes, lesions, ecchymosis.  Neuro: Cranial nerves intact. No cerebellar symptoms.  Psych: Awake and oriented X 3, normal affect, Insight and Judgment appropriate.    Magda Bernheim, NP 2:52 PM [facility]

## 2021-02-10 ENCOUNTER — Other Ambulatory Visit: Payer: Self-pay

## 2021-02-10 ENCOUNTER — Encounter: Payer: Self-pay | Admitting: Nurse Practitioner

## 2021-02-10 ENCOUNTER — Ambulatory Visit (INDEPENDENT_AMBULATORY_CARE_PROVIDER_SITE_OTHER): Payer: Medicare Other | Admitting: Nurse Practitioner

## 2021-02-10 VITALS — BP 140/78 | HR 65 | Temp 97.5°F | Wt 253.8 lb

## 2021-02-10 DIAGNOSIS — E1122 Type 2 diabetes mellitus with diabetic chronic kidney disease: Secondary | ICD-10-CM | POA: Diagnosis not present

## 2021-02-10 DIAGNOSIS — I700 Atherosclerosis of aorta: Secondary | ICD-10-CM | POA: Diagnosis not present

## 2021-02-10 DIAGNOSIS — I480 Paroxysmal atrial fibrillation: Secondary | ICD-10-CM

## 2021-02-10 DIAGNOSIS — E039 Hypothyroidism, unspecified: Secondary | ICD-10-CM

## 2021-02-10 DIAGNOSIS — I10 Essential (primary) hypertension: Secondary | ICD-10-CM

## 2021-02-10 DIAGNOSIS — Z7901 Long term (current) use of anticoagulants: Secondary | ICD-10-CM

## 2021-02-10 DIAGNOSIS — E785 Hyperlipidemia, unspecified: Secondary | ICD-10-CM | POA: Diagnosis not present

## 2021-02-10 DIAGNOSIS — E1169 Type 2 diabetes mellitus with other specified complication: Secondary | ICD-10-CM | POA: Diagnosis not present

## 2021-02-10 DIAGNOSIS — N182 Chronic kidney disease, stage 2 (mild): Secondary | ICD-10-CM

## 2021-02-10 DIAGNOSIS — I2510 Atherosclerotic heart disease of native coronary artery without angina pectoris: Secondary | ICD-10-CM

## 2021-02-10 NOTE — Patient Instructions (Signed)
Warfarin Coagulopathy Warfarin coagulopathy refers to bleeding that may occur as a complication of the medicine warfarin. This can be life-threatening. Warfarin is a blood thinner (anticoagulant). Anticoagulants prevent dangerous blood clots. Bleeding is the most common and most serious complication of warfarin. While taking warfarin, you will need to have blood tests (prothrombin tests, or PT tests) regularly to measure your blood clotting time. The PT test results will be reported as the international normalized ratio (INR). The INR tells your health care provider whether your dosage of warfarin needs to be changed. The longer it takes your blood to clot, the higher the INR. Your risk of warfarin coagulopathy increases as your INR increases. What are the causes? This condition may be caused by: Taking too much warfarin (overdose). Underlying medical conditions. Dietary changes. Interactions with medicines, supplements, or alcohol. What are the signs or symptoms? Warfarin coagulopathy may cause bleeding from anywhere. Symptoms may include: Bleeding from the gums, or a nosebleed that is not easily stopped. Blood in your stool or urine. This may look like bright red, dark, or black, tarry stool, or pink, red, or brown urine. Unusual bruising or bruising easily, or a cut that does not stop bleeding within 10 minutes. Coughing up or vomiting blood. Feeling nauseous for longer than 1 day. Broken blood vessels in the eye. Abdominal or back pain with or without bruising. Unusual vaginal bleeding. Swelling or pain at an injection site. Skin scarring due to tissue death of fatty tissue. This may cause pain in the waist, thighs, or buttocks. This is more common among women. Symptoms may also include stroke signs, such as: Sudden, severe headache. Sudden weakness or numbness of the face, arm, or leg, especially on one side of the body. Sudden confusion. Difficulty speaking or understanding  speech. Sudden trouble seeing out of one or both eyes. Unexpected difficulty walking. Dizziness. Loss of balance or coordination. How is this diagnosed? This condition is diagnosed after your health care provider places you on warfarin and then tests your blood's ability to clot. Prothrombin time (PT) clotting tests are used to monitor your clotting ability. How is this treated? If you have bleeding, you may be treated with vitamin K. Vitamin K helps the blood to clot. You may also receive donated plasma. Plasma is the liquid part of blood. It contains substances that help the blood clot. If you have life-threatening bleeding, you may be given other medicines through an IV. Follow these instructions at home: Medicines Take warfarin exactly as told by your health care provider, at the same time every day. Doing this helps you avoid bleeding or blood clots that could result in serious injury, pain, or disability. Contact your health care provider if a dose is forgotten or missed. Do not change or take additional doses to make up for missed or accidental extra doses. Many prescription, over-the-counter, and pain medicines, as well as vitamins, herbs, and supplements can interfere with warfarin. Talk with your health care provider or pharmacist before starting or stopping any new medicines. Your warfarin dosage may need to be adjusted. Eating and drinking It is important to maintain a normal, balanced diet while taking warfarin. Avoid major changes in your diet. If you are planning to change your diet, talk with your health care provider before making changes. Your health care provider may recommend that you work with a dietitian. Vitamin K makes warfarin less effective. It is found in many foods. Eat a consistent amount of foods that contain vitamin K. For example,  you may decide to eat 2 vitamin K-containing foods each day. Eating the same amount each day enables your health care provider to set the  correct dose of warfarin. Tests  Make sure to have PT tests at least once every 4-6 weeks while you are taking warfarin. Ask your health care provider what your target INR range is. Make sure you always know your target range. If your INR is not in your target range, your health care provider may adjust your dosage. Preventing bleeding and injury Some common over-the-counter medicines and supplements may increase the risk of bleeding while taking warfarin, including: Aspirin. NSAIDs, such as ibuprofen or naproxen. Vitamin E. Fish oils. Avoid situations that cause bleeding by: Using a softer toothbrush. Flossing with waxed floss, not unwaxed floss. Shaving with an Copy, not with a blade. Limiting your use of sharp objects. Avoiding potentially harmful activities, such as contact sports. General instructions Wear a medical alert bracelet or carry a card that lists what medicines you take. Make sure that all health care providers, including your dentist, know that you are taking warfarin. If you plan to breastfeed or become pregnant while taking warfarin, talk with your health care provider. Avoid alcohol, drugs, and products that contain nicotine and tobacco. If you change the amount of nicotine, tobacco, or alcohol you use, tell your health care provider. Keep all follow-up visits, including visits for lab tests. This is important. Contact a health care provider if: You miss a dose. You take an extra dose. You plan to have any kind of surgery or procedure. You may have to stop taking warfarin before your surgery. You are unable to take your medicine due to nausea, vomiting, or diarrhea. You have any major changes in your diet, or you plan to make major changes in your diet. You start or stop any over-the-counter medicine, prescription medicine, or dietary supplement. You become pregnant, plan to become pregnant, or think you may be pregnant. You have menstrual periods that  are heavier than usual, or unusual vaginal bleeding. You have unusual bruising. You lose your appetite. You have a fever. You have diarrhea that lasts for more than 24 hours. Get help right away if: You develop symptoms of an allergic reaction, such as: Swelling of the lips, face, tongue, mouth, or throat. Rash. Itching. Itchy, red, swollen areas of skin (hives). Trouble breathing. Chest tightness. You have any symptoms of a stroke. "BE FAST" is an easy way to remember the main warning signs of a stroke: B - Balance. Signs are dizziness, sudden trouble walking, or loss of balance. E - Eyes. Signs are trouble seeing or a sudden change in vision. F - Face. Signs are sudden weakness or numbness of the face, or the face or eyelid drooping on one side. A - Arms. Signs are weakness or numbness in an arm. This happens suddenly and usually on one side of the body. S - Speech. Signs are sudden trouble speaking, slurred speech, or trouble understanding what people say. T - Time. Time to call emergency services. Write down what time symptoms started. You have other signs of stroke, such as: A sudden, severe headache with no known cause. Nausea or vomiting. Seizure. You have signs or symptoms of a blood clot, such as: Pain or swelling in your leg or arm. Skin that is red or warm to the touch on your arm or leg. Shortness of breath or difficulty breathing. Chest pain. Unexplained fever. You have: A fall or an accident, especially  if you hit your head. Blood in your urine. Your urine may look reddish, pinkish, or tea-colored. Blood in your stool. Your stool may be black or bright red. Bleeding that does not stop after applying pressure to the area for 30 minutes. Severe pain in your joints or back. Purple or blue toes. Skin ulcers that do not go away. You vomit blood or cough up blood. The blood may be bright red, or it may look like coffee grounds. These symptoms may represent a serious  problem that is an emergency. Do not wait to see if the symptoms will go away. Get medical help right away. Call your local emergency services (911 in the [place] Do not drive yourself to the hospital. Summary Warfarin needs to be closely monitored with blood tests. It is very important to keep all lab visits and follow-up visits with your health care provider. Make sure you know your target INR range and your warfarin dosage. Monitor how much vitamin K you eat every day. Try to eat the same amount every day. Wear or carry identification that says you are taking warfarin. Take warfarin at the same time every day. Call your health care provider if you miss a dose or if you take an extra dose. Do not change the dosage of warfarin on your own. Know the signs and symptoms of blood clots, bleeding, and stroke. Know when to get emergency medical help. This information is not intended to replace advice given to you by your health care provider. Make sure you discuss any questions you have with your health care provider. Document Revised: 07/31/2020 Document Reviewed: 07/31/2020 Elsevier Patient Education  [place].

## 2021-02-11 LAB — HEMOGLOBIN A1C
Hgb A1c MFr Bld: 7.1 % of total Hgb — ABNORMAL HIGH (ref <5.7)
Mean Plasma Glucose: 157 mg/dL
eAG (mmol/L): 8.7 mmol/L

## 2021-02-11 LAB — LIPID PANEL
Cholesterol: 96 mg/dL (ref <200)
HDL: 37 mg/dL — ABNORMAL LOW (ref >=40)
LDL Cholesterol (Calc): 33 mg/dL (calc)
Non-HDL Cholesterol (Calc): 59 mg/dL (calc) (ref <130)
Total CHOL/HDL Ratio: 2.6 (calc) (ref <5.0)
Triglycerides: 189 mg/dL — ABNORMAL HIGH (ref <150)

## 2021-02-11 LAB — COMPLETE METABOLIC PANEL WITH GFR
AG Ratio: 1.3 (calc) (ref 1.0–2.5)
ALT: 21 U/L (ref 9–46)
AST: 18 U/L (ref 10–35)
Albumin: 4.4 g/dL (ref 3.6–5.1)
Alkaline phosphatase (APISO): 109 U/L (ref 35–144)
BUN: 12 mg/dL (ref 7–25)
CO2: 29 mmol/L (ref 20–32)
Calcium: 9.4 mg/dL (ref 8.6–10.3)
Chloride: 101 mmol/L (ref 98–110)
Creat: 0.96 mg/dL (ref 0.70–1.28)
Globulin: 3.4 g/dL (calc) (ref 1.9–3.7)
Glucose, Bld: 129 mg/dL — ABNORMAL HIGH (ref 65–99)
Potassium: 4.3 mmol/L (ref 3.5–5.3)
Sodium: 141 mmol/L (ref 135–146)
Total Bilirubin: 0.9 mg/dL (ref 0.2–1.2)
Total Protein: 7.8 g/dL (ref 6.1–8.1)
eGFR: 85 mL/min/{1.73_m2} (ref >=60)

## 2021-02-11 LAB — CBC WITH DIFFERENTIAL/PLATELET
Absolute Monocytes: 540 cells/uL (ref 200–950)
Basophils Absolute: 37 cells/uL (ref 0–200)
Basophils Relative: 0.5 %
Eosinophils Absolute: 192 cells/uL (ref 15–500)
Eosinophils Relative: 2.6 %
HCT: 45.2 % (ref 38.5–50.0)
Hemoglobin: 14.6 g/dL (ref 13.2–17.1)
Lymphs Abs: 1487 cells/uL (ref 850–3900)
MCH: 30.9 pg (ref 27.0–33.0)
MCHC: 32.3 g/dL (ref 32.0–36.0)
MCV: 95.6 fL (ref 80.0–100.0)
MPV: 10.1 fL (ref 7.5–12.5)
Monocytes Relative: 7.3 %
Neutro Abs: 5143 cells/uL (ref 1500–7800)
Neutrophils Relative %: 69.5 %
Platelets: 276 10*3/uL (ref 140–400)
RBC: 4.73 10*6/uL (ref 4.20–5.80)
RDW: 15.3 % — ABNORMAL HIGH (ref 11.0–15.0)
Total Lymphocyte: 20.1 %
WBC: 7.4 10*3/uL (ref 3.8–10.8)

## 2021-02-11 LAB — TSH: TSH: 3.22 mIU/L (ref 0.40–4.50)

## 2021-02-11 LAB — PROTIME-INR
INR: 4.5 — ABNORMAL HIGH
Prothrombin Time: 41.6 s — ABNORMAL HIGH (ref 9.0–11.5)

## 2021-02-11 LAB — MICROALBUMIN / CREATININE URINE RATIO
Creatinine, Urine: 87 mg/dL (ref 20–320)
Microalb Creat Ratio: 30 mcg/mg creat — ABNORMAL HIGH (ref <30)
Microalb, Ur: 2.6 mg/dL

## 2021-02-19 ENCOUNTER — Ambulatory Visit: Payer: Medicare Other | Admitting: *Deleted

## 2021-02-19 ENCOUNTER — Other Ambulatory Visit: Payer: Self-pay

## 2021-02-19 DIAGNOSIS — I4819 Other persistent atrial fibrillation: Secondary | ICD-10-CM | POA: Diagnosis not present

## 2021-02-19 DIAGNOSIS — Z5181 Encounter for therapeutic drug level monitoring: Secondary | ICD-10-CM | POA: Diagnosis not present

## 2021-02-19 LAB — POCT INR: INR: 1.8 — AB (ref 2.0–3.0)

## 2021-02-19 NOTE — Patient Instructions (Signed)
Description   Resume taking Warfarin 1.5 tablets daily except 1 tablet on Sundays and Thursdays. Recheck INR in 2 weeks. [facility]in Clinic: [phone] Main number: [phone] Fax number [phone]

## 2021-02-26 ENCOUNTER — Other Ambulatory Visit: Payer: Self-pay | Admitting: Cardiology

## 2021-03-05 ENCOUNTER — Ambulatory Visit: Payer: Medicare Other

## 2021-03-05 ENCOUNTER — Other Ambulatory Visit: Payer: Self-pay

## 2021-03-05 ENCOUNTER — Encounter: Payer: Self-pay | Admitting: Internal Medicine

## 2021-03-05 DIAGNOSIS — I4819 Other persistent atrial fibrillation: Secondary | ICD-10-CM | POA: Diagnosis not present

## 2021-03-05 DIAGNOSIS — Z5181 Encounter for therapeutic drug level monitoring: Secondary | ICD-10-CM

## 2021-03-05 LAB — POCT INR: INR: 2.6 (ref 2.0–3.0)

## 2021-03-05 NOTE — Patient Instructions (Signed)
Description   Continue taking Warfarin 1.5 tablets daily except 1 tablet on Sundays and Thursdays. Recheck INR in 4 weeks. Coumadin Clinic: [phone] Main number: [phone] Fax number [phone]

## 2021-03-10 ENCOUNTER — Other Ambulatory Visit: Payer: Self-pay | Admitting: Adult Health

## 2021-03-31 ENCOUNTER — Other Ambulatory Visit: Payer: Self-pay | Admitting: Internal Medicine

## 2021-03-31 DIAGNOSIS — I10 Essential (primary) hypertension: Secondary | ICD-10-CM

## 2021-04-02 ENCOUNTER — Telehealth: Payer: Self-pay | Admitting: Internal Medicine

## 2021-04-02 NOTE — Chronic Care Management (AMB) (Signed)
  Chronic Care Management   Outreach Note  04/02/2021 Name: ZAKERY NORMINGTON MRN: [id number] DOB: 06-Feb-1951  Referred by: [name], MD Reason for referral : No chief complaint on file.   An unsuccessful telephone outreach was attempted today. The patient was referred to the pharmacist for assistance with care management and care coordination.   Follow Up Plan:   Tatjana Dellinger Upstream Scheduler

## 2021-04-02 NOTE — Progress Notes (Signed)
  Chronic Care Management   Note  04/02/2021 Name: MEDFORD STAHELI MRN: [id number] DOB: 1951/01/12  [name] is a 70 y.o. year old male who is a primary care patient of [name], MD. I reached out to [name] by phone today in response to a referral sent by Mr. Yann Biehn Maves's PCP, [name], MD.   Mr. Sara was given information about Chronic Care Management services today including:  CCM service includes personalized support from designated clinical staff supervised by his physician, including individualized plan of care and coordination with other care providers 24/7 contact phone numbers for assistance for urgent and routine care needs. Service will only be billed when office clinical staff spend 20 minutes or more in a month to coordinate care. Only one practitioner may furnish and bill the service in a calendar month. The patient may stop CCM services at any time (effective at the end of the month) by phone call to the office staff.   Patient agreed to services and verbal consent obtained.   Follow up plan:   Tatjana Secretary/administrator

## 2021-04-07 ENCOUNTER — Other Ambulatory Visit: Payer: Self-pay | Admitting: *Deleted

## 2021-04-07 MED ORDER — WARFARIN SODIUM 5 MG PO TABS: ORAL_TABLET | ORAL | 3 refills | Status: DC

## 2021-04-10 ENCOUNTER — Ambulatory Visit (INDEPENDENT_AMBULATORY_CARE_PROVIDER_SITE_OTHER): Payer: Medicare Other

## 2021-04-10 ENCOUNTER — Other Ambulatory Visit: Payer: Self-pay | Admitting: Adult Health

## 2021-04-10 ENCOUNTER — Ambulatory Visit: Payer: Medicare Other | Admitting: Cardiology

## 2021-04-10 ENCOUNTER — Other Ambulatory Visit: Payer: Self-pay

## 2021-04-10 ENCOUNTER — Encounter: Payer: Self-pay | Admitting: Cardiology

## 2021-04-10 VITALS — BP 136/70 | HR 68 | Ht 72.0 in | Wt 256.6 lb

## 2021-04-10 DIAGNOSIS — E1169 Type 2 diabetes mellitus with other specified complication: Secondary | ICD-10-CM

## 2021-04-10 DIAGNOSIS — I10 Essential (primary) hypertension: Secondary | ICD-10-CM

## 2021-04-10 DIAGNOSIS — I6523 Occlusion and stenosis of bilateral carotid arteries: Secondary | ICD-10-CM

## 2021-04-10 DIAGNOSIS — I4819 Other persistent atrial fibrillation: Secondary | ICD-10-CM | POA: Diagnosis not present

## 2021-04-10 DIAGNOSIS — I2510 Atherosclerotic heart disease of native coronary artery without angina pectoris: Secondary | ICD-10-CM | POA: Diagnosis not present

## 2021-04-10 DIAGNOSIS — Z5181 Encounter for therapeutic drug level monitoring: Secondary | ICD-10-CM | POA: Diagnosis not present

## 2021-04-10 DIAGNOSIS — E785 Hyperlipidemia, unspecified: Secondary | ICD-10-CM

## 2021-04-10 DIAGNOSIS — R079 Chest pain, unspecified: Secondary | ICD-10-CM

## 2021-04-10 LAB — POCT INR: INR: 2.9 (ref 2.0–3.0)

## 2021-04-10 MED ORDER — ATORVASTATIN CALCIUM 80 MG PO TABS: 40.0000 mg | ORAL_TABLET | Freq: Every day | ORAL | 1 refills | Status: DC

## 2021-04-10 MED ORDER — AMLODIPINE BESYLATE 10 MG PO TABS: ORAL_TABLET | ORAL | 3 refills | Status: DC

## 2021-04-10 MED ORDER — CARVEDILOL 12.5 MG PO TABS: 12.5000 mg | ORAL_TABLET | Freq: Two times a day (BID) | ORAL | 3 refills | Status: DC

## 2021-04-10 MED ORDER — OLMESARTAN MEDOXOMIL 40 MG PO TABS: ORAL_TABLET | ORAL | 3 refills | Status: DC

## 2021-04-10 NOTE — Patient Instructions (Signed)
Description   Continue taking Warfarin 1.5 tablets daily except 1 tablet on Sundays and Thursdays. Recheck INR in 6 weeks. Coumadin Clinic: [phone] Main number: [phone] Fax number [phone]

## 2021-04-10 NOTE — Addendum Note (Signed)
Addended by: Antonieta Iba on: 04/10/2021 02:53 PM   Modules accepted: Orders

## 2021-04-10 NOTE — Progress Notes (Signed)
Date:  04/10/2021   ID:  Christopher Wade, DOB 1950/12/03, MRN [id number]   PCP:  [name], MD  Cardiologist:  Fransico Him, MD  Electrophysiologist:  Constance Haw, MD   Chief Complaint:  Afib, HTN, CAD, HLD  History of Present Illness:    Christopher Wade is a 70 y.o. male  with a hx of persistent atrial fibrillation s/p  DCCV 03/2015 and again in July 2017 after Amio load. He ultimately underwent afib ablation 06/11/2016.  He was found to have early lung toxicity and his Amio was stopped. He also has a history of HTN, dyslipidemia, DM, ASCAD cath showing 50% LM, 40% distal LAD, 70% ost to prox LCX, 40% mid LAD and 95% mid to dist RCA s/p PCI with DES x 2 and stage PCI of the LCx/OM.    He has chronic DOE mainly with extreme exertion and has been there for years with no change.  He occasionally has some chest pain that is sharp that occurs when he is up moving around.  He will get SOB with the pain but no nausea or diaphoresis.  The pain occurs sporadically and then it will come on .  He denies any PND, orthopnea, dizziness, palpitations, syncope.  He also has chronic mild lower extremity edema which is fairly stable.  Prior CV studies:   The following studies were reviewed today:  Labs from PCP  Past Medical History:  Diagnosis Date   AKI (acute kidney injury) ([place]) 05/27/2020   Arthritis    "joints get stiff" (06/11/2016)   Bee sting allergy    Bee sting allergy    CAD (coronary artery disease), native coronary artery 02/12/2016   cath showing 50% LM, 40% distal LAD, 70% ost to prox LCs, 40% mid LAD and 95% mid to dist RCA s/p PCI with DES x 2 and stage PCI of the LCx/OM.   Carotid artery stenosis    1-39% bilateral by dopplers 08/2017   History of gout    Hx of adenomatous colonic polyps 04/10/2008   Hyperlipidemia    Hypertension    Hypothyroidism    OSA (obstructive sleep apnea)    "tried CPAP; couldn't do it" (06/11/2016)   Persistent atrial fibrillation ([place])     s/p DCCV 03/2015 with reversion back to atrial fibrillation, loaded with Amio and s/p DCCV to NSR 11/2015.  He is now s/p afib albation.  He had amio lung toxicity and amio was stopped.   Thyroid disease    Type II diabetes mellitus ([place])    Vitamin D deficiency    Past Surgical History:  Procedure Laterality Date   BIOPSY  05/28/2020   Procedure: BIOPSY;  Surgeon: Jerene Bears, MD;  Location: [facility] ENDOSCOPY;  Service: Gastroenterology;;   CARDIAC CATHETERIZATION N/A 10/11/2015   Procedure: Left Heart Cath and Coronary Angiography;  Surgeon: [name], MD;  Location: [place] CV LAB;  Service: Cardiovascular;  Laterality: N/A;   CARDIAC CATHETERIZATION N/A 10/11/2015   Procedure: Coronary Stent Intervention;  Surgeon: [name], MD;  Location: [place] CV LAB;  Service: Cardiovascular;  Laterality: N/A;   CARDIAC CATHETERIZATION N/A 10/11/2015   Procedure: Intravascular Pressure Wire/FFR Study;  Surgeon: [name], MD;  Location: [place] [facility];  Service: Cardiovascular;  Laterality: N/A;   CARDIAC CATHETERIZATION N/A 10/14/2015   Procedure: Coronary Stent Intervention;  Surgeon: [name], MD;  Location: [place] [facility];  Service: Cardiovascular;  Laterality: N/A;  CARDIOVERSION N/A 04/05/2015   Procedure: CARDIOVERSION;  Surgeon: [name], MD;  Location: [place];  Service: Cardiovascular;  Laterality: N/A;   CARDIOVERSION N/A 12/09/2015   Procedure: CARDIOVERSION;  Surgeon: [name], MD;  Location: [facility] ENDOSCOPY;  Service: Cardiovascular;  Laterality: N/A;   CARDIOVERSION N/A 03/16/2016   Procedure: CARDIOVERSION;  Surgeon: [name], MD;  Location: [place];  Service: Cardiovascular;  Laterality: N/A;   CATARACT EXTRACTION Right 2019   Dr. Patrici Ranks   CORONARY ANGIOPLASTY     ELECTROPHYSIOLOGIC STUDY N/A 06/11/2016   Procedure: Atrial Fibrillation Ablation;  Surgeon: Will [name], MD;  Location: [place] [facility];  Service:  Cardiovascular;  Laterality: N/A;   ESOPHAGOGASTRODUODENOSCOPY (EGD) WITH PROPOFOL N/A 05/28/2020   Procedure: ESOPHAGOGASTRODUODENOSCOPY (EGD) WITH PROPOFOL;  Surgeon: Jerene Bears, MD;  Location: [place];  Service: Gastroenterology;  Laterality: N/A;   TOTAL HIP ARTHROPLASTY Bilateral 2001   UVULECTOMY       Current Meds  Medication Sig   acetaminophen (TYLENOL) 325 MG tablet Take 650 mg by mouth every 6 (six) hours as needed for mild pain.   allopurinol (ZYLOPRIM) 300 MG tablet TAKE 1 TABLET BY MOUTH DAILY FOR GOUT PREVENTION   amLODipine (NORVASC) 10 MG tablet Take 1 tab  Daily For BP & Heart   Ascorbic Acid (VITAMIN C WITH ROSE HIPS) 500 MG tablet Take 500 mg by mouth daily.   atorvastatin (LIPITOR) 80 MG tablet Take 0.5 tablets (40 mg total) by mouth daily.   B Complex Vitamins (B COMPLEX PO) Take 1 tablet by mouth daily.   carvedilol (COREG) 12.5 MG tablet TAKE 2 TABLETS BY MOUTH TWICE DAILY FOR BLOOD PRESSURE AND HEART   cholecalciferol (VITAMIN D) 1000 UNITS tablet Take 2,000 Units by mouth daily.   levothyroxine (SYNTHROID) 100 MCG tablet TAKE 1 TABLET DAILY ON EMPTY STOMACH WITH WATER FOR 30 MINS& NO ANTACID MEDS, CALCIUM, OR MAGNESIUM FOR 4 HRS& AVOID BIOTIN   loratadine (CLARITIN) 10 MG tablet Take 10 mg by mouth daily.   magnesium gluconate (MAGONATE) 500 MG tablet Take 1,000 mg by mouth daily.    metFORMIN (GLUCOPHAGE-XR) 500 MG 24 hr tablet TAKE 2 TABLETS BY MOUTH TWICE DAILY WITH MEALS FOR DIABETES   olmesartan (BENICAR) 40 MG tablet TAKE 1 TABLET BY MOUTH DAILY FOR BLOOD PRESSURE   Omega-3 Fatty Acids (FISH OIL) 1200 MG CAPS Take 1,200 mg by mouth daily.   pantoprazole (PROTONIX) 40 MG tablet TAKE 1 TABLET EVERY DAY BEFORE BREAKFAST   warfarin (COUMADIN) 5 MG tablet Take 1 tablet by mouth on Thursdays and Sundays and 1.5 tablets all other days.   zinc gluconate 50 MG tablet Take 50 mg by mouth daily.     Allergies:   Amiodarone and Bee venom   Social History    Tobacco Use   Smoking status: Never   Smokeless tobacco: Never  Vaping Use   Vaping Use: Never used  Substance Use Topics   Alcohol use: No   Drug use: No     Family Hx: The patient's family history includes Colon cancer in his sister; Liver disease in his brother, father, mother, and sister; Lung cancer in his father and mother; Prostate cancer in his brother. There is no history of Stomach cancer, Pancreatic cancer, or Esophageal cancer.  ROS:   Please see the history of present illness.     All other systems reviewed and are negative.   Labs/Other Tests and Data Reviewed:    Recent Labs:  05/27/2020: B Natriuretic Peptide 86.8 11/06/2020: Magnesium 1.6 02/10/2021: ALT 21; BUN 12; Creat 0.96; Hemoglobin 14.6; Platelets 276; Potassium 4.3; Sodium 141; TSH 3.22   Recent Lipid Panel Lab Results  Component Value Date/Time   CHOL 96 02/10/2021 03:16 PM   TRIG 189 (H) 02/10/2021 03:16 PM   HDL 37 (L) 02/10/2021 03:16 PM   CHOLHDL 2.6 02/10/2021 03:16 PM   LDLCALC 33 02/10/2021 03:16 PM    Wt Readings from Last 3 Encounters:  04/10/21 256 lb 9.6 oz (116.4 kg)  02/10/21 253 lb 12.8 oz (115.1 kg)  11/06/20 247 lb 3.2 oz (112.1 kg)     Objective:    Vital Signs:  BP 136/70   Pulse 68   Ht 6' (1.829 m)   Wt 256 lb 9.6 oz (116.4 kg)   SpO2 94%   BMI 34.80 kg/m    GEN: Well nourished, well developed in no acute distress HEENT: Normal NECK: No JVD; No carotid bruits LYMPHATICS: No lymphadenopathy CARDIAC:RRR, no murmurs, rubs, gallops RESPIRATORY:  Clear to auscultation without rales, wheezing or rhonchi  ABDOMEN: Soft, non-tender, non-distended MUSCULOSKELETAL:  No edema; No deformity  SKIN: Warm and dry NEUROLOGIC:  Alert and oriented x 3 PSYCHIATRIC:  Normal affect   ASSESSMENT & PLAN:    1.  ASCAD  -cath 2017 showed 50% LM, 40% distal LAD, 70% ost to prox LCX, 40% mid LAD and 95% mid to dist RCA s/p PCI with DES x 2 and stage PCI of the LCx/OM.    -He has had  some atypical CP lately that is associated with SOB and comes on with exertion and resolves with rest but his sharp.  He did not have any pain prior to his PCI. -Continue prescription drug management with carvedilol 25 mg twice daily and atorvastatin 80 mg daily with as needed refills -no ASA due to warfarin -I will get a lexsican myoview to rule out ischemia -Shared Decision Making/Informed Consent The risks [chest pain, shortness of breath, cardiac arrhythmias, dizziness, blood pressure fluctuations, myocardial infarction, stroke/transient ischemic attack, nausea, vomiting, allergic reaction, radiation exposure, metallic taste sensation and life-threatening complications (estimated to be 1 in 10,000)], benefits (risk stratification, diagnosing coronary artery disease, treatment guidance) and alternatives of a nuclear stress test were discussed in detail with Mr. Nuon and he agrees to proceed.  2.  HTN  -BP is controlled on exam today -Continue prescription drug management with Benicar 40 mg daily, carvedilol 25 mg twice daily and amlodipine 10 mg daily with as needed refills -I have personally reviewed and interpreted outside labs performed by patient's PCP which showed creatinine 0.96 and potassium 4.3 on 02/10/2021 -encouraged to follow < 2gm Na diet   3.  Persistent atrial fibrillation  -he is status post [name] ablation in 2018  -amiodarone stopped due to possible lung toxicity.  -He is maintaining normal sinus rhythm and denies any palpitations -continue prescription drug management with carvedilol 25 mg twice daily and warfarin with as needed refills -I have personally reviewed and interpreted outside labs performed by patient's PCP which showed hemoglobin 14.6 on 02/10/2021   4.  Bilateral carotid artery stenosis  -Dopplers 03/2020 showed 1 to 39% bilateral stenosis.   -repeat dopplers in 2023 -continue statin  5.  Hyperlipidemia  -LDL goal is less than 70.   -I have personally  reviewed and interpreted outside labs performed by patient's PCP which showed LDL 33, HDL 37, triglycerides 189, ALT 21 on 02/10/2021  -Continue prescription drug management with  atorvastatin 80 mg daily with as needed refills    Medication Adjustments/Labs and Tests Ordered: Current medicines are reviewed at length with the patient today.  Concerns regarding medicines are outlined above.  Tests Ordered: No orders of the defined types were placed in this encounter.  Medication Changes: No orders of the defined types were placed in this encounter.   Disposition:  Follow up in 1 year(s)  Signed, Fransico Him, MD  04/10/2021 2:41 PM    [place]

## 2021-04-10 NOTE — Patient Instructions (Signed)
Medication Instructions:  Your physician recommends that you continue on your current medications as directed. Please refer to the Current Medication list given to you today.  *If you need a refill on your cardiac medications before your next appointment, please call your pharmacy*  Testing/Procedures: Your physician has requested that you have a lexiscan myoview. For further information please visit [email or web address]. Please follow instruction sheet, as given.  Follow-Up: At [facility], you and your health needs are our priority.  As part of our continuing mission to provide you with exceptional heart care, we have created designated Provider Care Teams.  These Care Teams include your primary Cardiologist (physician) and Advanced Practice Providers (APPs -  Physician Assistants and Nurse Practitioners) who all work together to provide you with the care you need, when you need it.  Your next appointment:   1 year(s)  The format for your next appointment:   In Person  Provider:   Fransico Him, MD

## 2021-04-15 NOTE — Addendum Note (Signed)
Addended by: Antonieta Iba on: 04/15/2021 07:36 AM   Modules accepted: Orders

## 2021-04-15 NOTE — Addendum Note (Signed)
Addended by: [name] on: 04/15/2021 08:12 AM   Modules accepted: Orders

## 2021-04-23 ENCOUNTER — Telehealth (HOSPITAL_COMMUNITY): Payer: Self-pay

## 2021-04-23 NOTE — Telephone Encounter (Signed)
Detailed instructions left on the patient's answering machine. Asked to call back with any questions. S.Ryszard Socarras EMTP 

## 2021-04-24 ENCOUNTER — Other Ambulatory Visit: Payer: Self-pay

## 2021-04-24 ENCOUNTER — Ambulatory Visit (HOSPITAL_COMMUNITY): Payer: Medicare Other | Attending: Cardiovascular Disease

## 2021-04-24 DIAGNOSIS — I10 Essential (primary) hypertension: Secondary | ICD-10-CM | POA: Insufficient documentation

## 2021-04-24 DIAGNOSIS — E1169 Type 2 diabetes mellitus with other specified complication: Secondary | ICD-10-CM | POA: Insufficient documentation

## 2021-04-24 DIAGNOSIS — I4819 Other persistent atrial fibrillation: Secondary | ICD-10-CM | POA: Diagnosis not present

## 2021-04-24 DIAGNOSIS — I2510 Atherosclerotic heart disease of native coronary artery without angina pectoris: Secondary | ICD-10-CM | POA: Insufficient documentation

## 2021-04-24 DIAGNOSIS — I6523 Occlusion and stenosis of bilateral carotid arteries: Secondary | ICD-10-CM

## 2021-04-24 DIAGNOSIS — E785 Hyperlipidemia, unspecified: Secondary | ICD-10-CM

## 2021-04-24 LAB — MYOCARDIAL PERFUSION IMAGING
LV dias vol: 93 mL (ref 62–150)
LV sys vol: 31 mL
Nuc Stress EF: 67 %
Rest Nuclear Isotope Dose: 10.8 mCi
SDS: 0
SRS: 0
SSS: 0
ST Depression (mm): 0 mm
Stress Nuclear Isotope Dose: 31.8 mCi
TID: 1.01

## 2021-04-24 MED ORDER — TECHNETIUM TC 99M TETROFOSMIN IV KIT
10.8000 | PACK | Freq: Once | INTRAVENOUS | Status: AC | PRN
  Administered 2021-04-24: 10.8 via INTRAVENOUS
  Filled 2021-04-24: qty 11

## 2021-04-24 MED ORDER — TECHNETIUM TC 99M TETROFOSMIN IV KIT
31.8000 | PACK | Freq: Once | INTRAVENOUS | Status: AC | PRN
  Administered 2021-04-24: 31.8 via INTRAVENOUS
  Filled 2021-04-24: qty 32

## 2021-04-24 MED ORDER — REGADENOSON 0.4 MG/5ML IV SOLN
0.4000 mg | Freq: Once | INTRAVENOUS | Status: AC
  Administered 2021-04-24: 0.4 mg via INTRAVENOUS

## 2021-05-01 ENCOUNTER — Ambulatory Visit: Payer: Medicare Other | Admitting: Pharmacist

## 2021-05-01 ENCOUNTER — Other Ambulatory Visit: Payer: Self-pay

## 2021-05-01 VITALS — BP 134/76 | HR 72

## 2021-05-01 DIAGNOSIS — E1169 Type 2 diabetes mellitus with other specified complication: Secondary | ICD-10-CM

## 2021-05-01 DIAGNOSIS — I700 Atherosclerosis of aorta: Secondary | ICD-10-CM

## 2021-05-01 DIAGNOSIS — I10 Essential (primary) hypertension: Secondary | ICD-10-CM

## 2021-05-01 DIAGNOSIS — I2510 Atherosclerotic heart disease of native coronary artery without angina pectoris: Secondary | ICD-10-CM

## 2021-05-01 DIAGNOSIS — E1122 Type 2 diabetes mellitus with diabetic chronic kidney disease: Secondary | ICD-10-CM

## 2021-05-01 DIAGNOSIS — E785 Hyperlipidemia, unspecified: Secondary | ICD-10-CM

## 2021-05-01 DIAGNOSIS — N182 Chronic kidney disease, stage 2 (mild): Secondary | ICD-10-CM

## 2021-05-01 DIAGNOSIS — E039 Hypothyroidism, unspecified: Secondary | ICD-10-CM

## 2021-05-01 DIAGNOSIS — M1A9XX0 Chronic gout, unspecified, without tophus (tophi): Secondary | ICD-10-CM

## 2021-05-01 DIAGNOSIS — I4819 Other persistent atrial fibrillation: Secondary | ICD-10-CM

## 2021-05-09 NOTE — Progress Notes (Signed)
Patient Visit with Chart Prep  Christopher Wade, Christopher Wade  [id number] 39 years, Male  DOB: [birth year]  M: [phone] Care Team:   Imogene Burn, Trader   __________________________________________________ Summary: Christopher Wade is a friendly 70 year old male who presents for CCM initial visit. He has no chief complaints today  Recommendations/Changes made from today's visit: Consider pt for BP RPM for home monitoring of BP  Patient scheduled for CCM visit with the clinical pharmacist.  Patient is referred for CCM by their PCP and CPP is under general PCP supervision.: At least 2 of these conditions are expected to last 12 months or longer and patient is at significant risk for acute exacerbations and/or functional decline.  Patient has consented to participation in [facility] program. Visit Type: Phone Call Date of Upcoming Visit: 05/01/2021  Patient Chart Prep [facility])  Chronic Conditions Patient's Chronic Conditions: Hypertension (HTN), Cardiovascular Disease (CVD), Atrial Fibrillation, Other, Chronic Kidney Disease (CKD), Gout, Edema, Hypothyroidism, Diabetes (DM), Hyperlipidemia/Dyslipidemia (HLD), OSA, ILD, Vitamin D Deficiency  Doctor and Hospital Visits Were there PCP Visits in last 6 months?: Yes Visit #1: 02/10/2021- [name], NP- Patient was seen for follow-up. No medication changes.  Visit #2: 11/06/2020- [name]- Patient was seen for an adult medical exam. No medication changes.  Were there Specialist Visits in last 6 months?: Yes Visit #1: 04/10/2021- [name] (cardiology)- Patient was seen for follow-up. Changed Carvedilol 12.5mg  oral 2x daily with meals.  Was there a Hospital Visit in last 30 days?: No Were there other Hospital Visits in last 6 months?: No  Medication Information Are there any Medication discrepancies?: No Are there any Medication adherence gaps (beyond 5 days past due)?: No Medication adherence rates for the STAR rating drugs: atorvastatin (LIPITOR) 80 MG  tablet 45 tablet 1 04/10/2021   Sig - Route: Take 0.5 tablets (40 mg total) by mouth daily   olmesartan (BENICAR) 40 MG tablet 90 tablet 3 04/10/2021   Sig: TAKE 1 TABLET BY MOUTH DAILY FOR BLOOD PRESSURE   List Patient's current Care Gaps: No current Care Gaps identified  Pre-Call Questions [facility]) Are you able to connect with Patient: No Were you able to leave a message?: Yes Relevant details: left detailed vm with appt reminder  Disease Assessments  Subjective Information Current BP: 136/70 Current HR: 68 taken on: 04/10/2021 Previous BP: 140/78 Previous HR: 65 taken on: 02/09/2021 Weight: 256 BMI: 34.79 Last GFR: 129 taken on: 02/09/2021 Why did the patient present?: CCM initial visit Marital status?: Single Details: Did not specify 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 shortness of breath, and get aggravated. Try to do housework Who does the patient spend their time with and what do they do?: Lives alone. Not right now. borther contacts every once a week. Brother Lifestyle habits such as diet and exercise?: Diet: salads, greens, green beans, lima beans. Usually eat 2 meals a day. Does not drink any caffeine. Drinks 80 ounces of water per day Alcohol, tobacco, and illicit drug usage?: No What is the patient's sleep pattern?: No sleep issues, Other (with free form text) Details: He gets up to use the bathroom a few times per night. This does not bother him, he drinks a lot of water How many hours per night does patient typically sleep?: 7-8 hours Patient pleased with health care they are receiving?: Yes Family, occupational, and living circumstances relevant to overall health?: The patient's family history includes Colon cancer in his  sister; Liver disease in his brother, father, mother, and sister; Lung cancer in his father and mother; Prostate cancer in his brother. There is no history of Stomach cancer, Pancreatic  cancer, or Esophageal cancer.    Name and location of Current pharmacy: [facility] [place], [place] - [address] (Ph: [phone]) Current Rx insurance plan: Other Details: BCBS Are meds synced by current pharmacy?: No Are meds delivered by current pharmacy?: No - delivery available but patient prefers to not use Would patient benefit from direct intervention of clinical lead in dispensing process to optimize clinical outcomes?: Yes Are [facility] pharmacy services available where patient lives?: Yes Is patient disadvantaged to use [facility]?: Yes Does patient experience delays in picking up medications due to transportation concerns (getting to pharmacy)?: No Any additional demeanor/mood notes?: Christopher Wade is a friendly 70 year old male who presents for CCM initial visit. He has no chief complaints today  Hypertension (HTN) Assess this condition today?: Yes Is patient able to obtain BP reading today?: Yes BP today is: 134/76 Heart Rate is: 72 Goal: <130/80 mmHG Hypertension Stage: Stage 1 (SBP: 130-139 or DBP: 80-89) Is Patient checking BP at home?: Yes Pt. home BP readings are ranging: 118 - 130s/60s - 70s How often does patient miss taking their blood pressure medications?: Never Has patient experienced hypotension, dizziness, falls or bradycardia?: No Check present secondary causes (below) for HTN: Sleep Apnea BP RPM device: No We discussed: DASH diet:  following a diet emphasizing fruits and vegetables and low-fat dairy products along with whole grains, fish, poultry, and nuts. Reducing red meats and sugars., Recommend using a salt substitute to replace your salt if you need flavor., Reducing the amount of salt intake to 1500mg /per day., Proper Home BP Measurement Assessment:: Controlled Drug: Amlodipine 10mg  QD Assessment: Appropriate, Effective, Safe, Accessible Drug: Carvedilol 12.5mg  BID Assessment:  Appropriate, Effective, Safe, Accessible Drug: Olmesartan 40mg  QD Assessment: Appropriate, Effective, Safe, Accessible HC Follow up: N/A Pharmacist Follow up: Continue to assess BP, CMP  Hyperlipidemia/Dyslipidemia (HLD) Last Lipid panel on: 02/09/2021 TC (Goal<200): 96 LDL: 33 HDL (Goal>40): 37 TG (Goal<150): 189 ASCVD 10-year risk?is:: N/A due to lab values outside the range Assess this condition today?: Yes LDL Goal: <70 Has patient tried and failed any HLD Medications?: No Check present secondary causes (below) that can lead to increased cholesterol levels (multi-choice optional): Beta blockers, Diabetes, Hypothyroidism We discussed: How to reduce cholesterol through diet/weight management and physical activity., How a diet high in plant sterols (fruits/vegetables/nuts/whole grains/legumes) may reduce your cholesterol., Encouraged increasing fiber to a daily intake of 10-25g/day Assessment:: Uncontrolled Drug: Atorvastatin 80mg  QD Assessment: Appropriate, Effective, Safe, Accessible HC Follow up: N/A Pharmacist Follow up: TG elevate, review lipid panel at next visit with [name]  Diabetes (DM) Current A1C: 7.1 taken on: 02/09/2021 Previous A1C: 6.6 taken on: 11/05/2020 Type: 2 The current microalbumin ratio is: 30 tested on: 02/09/2021 Assess this condition today?: Yes Goal A1C: < 7.0 % Type: 2 Is Patient taking statin medication?: Yes Is patient taking ACEi / ARB?: Yes DM RPM device: Does patient qualify?: Yes Patient checking Blood Sugar at home?: Yes Does patient use a Continuous Glucose Monitoring (CGM) device?: No How often does patient check Blood Sugars?: 1 time per day (pt is out of test strips) Recent fasting Blood sugar reading: 115-120 Has patient experienced any hypoglycemic episodes?: No Diet recall discussed: No We discussed: How to recognize and treat signs of hypoglycemia., Low  carbohydrate eating plan with an emphasis on whole grains, legumes,  nuts, fruits, and vegetables and minimal refined and processed foods., Modifying lifestyle, including to participate in moderate physical activity (e.g., walking) at least 150 minutes per week. Assessment:: Uncontrolled Drug: Metformin XR 500mg  2 tabs BID Assessment: Appropriate, Query Effectiveness Additional Info: Pt needs more strips HC Follow up: N/A Pharmacist Follow up: Pt counseled on diet changes, recheck A1c 12/20, if >7% elevated, consider initiating GLP-1  Gout Most recent uric acid: 4.5 taken on: 11/05/2020 Assess this condition today?: Yes When was your last Gout attack?: Patient does not remember What diet changes have you made to reduce the frequency of your gout attacks?: Increase water intake, Limit sodas / fruit juices Have these changes been successful in reducing the frequency of the patient's gout attacks?: Yes We discussed: Limiting consumption of red meat and organ meat (liver, heart, kidneys), Limiting consumption of shellfish, anchovies, sardines and trout, Limiting / avoiding drinks high in sugar like fruit juices and sodas, Maintaining a healthy weight and managing other comorbidities commonly associated with gout Assessment:: Controlled Drug: Allopurinol 300mg  QD Assessment: Appropriate, Effective, Safe, Accessible HC Follow up: N/A Pharmacist Follow up: continue to assess UA level  Atrial Fibrillation Current INR: 2.9 taken on: 04/10/2021 Previous INR: 2.6 taken on: 03/04/2021 Assess this condition today?: Yes CHADS2-VASc: Hypertension (1), Diabetes Mellitus (1), Age 56 to 36 years (1), Vascular Disease (1) CHADS2-VASc Score: 4 HAS-BLED: Medication Usage Predisposing to Bleeding (1), Age 44 years or older (1) HAS-BLED Score: 2 Type AF: Persistent Type of control: Rate controlled We discussed: Signs and symptoms of bleeding associated with anticoagulant use, Importance of adherence with anticoagulant therapy and regular monitoring Assessment::  Controlled Drug: Warfarin 5mg  1 tablet QD on Thursdays and Sundays and 1.5 tablets all other days Assessment: Appropriate, Effective, Safe, Accessible Drug: Carvedilol 12.5mg  Take 1 tablet by mouth 2 times daily with a meal. Assessment: Appropriate, Effective, Safe, Accessible HC Follow up: N/A Pharmacist Follow up: Pt follows with cardiologist and coumadin clinic  Cardiovascular Disease (CVD) Assess this condition today?: Yes Is Patient taking statin medication?: Yes Is patient currently Smoking or Vaping?: No Did patient smoke/vape before?: No Is patient currently on antiplatelet therapy?: Yes Is patient experiencing any abnormal bruising or bleeding?: No Patient has the following: Coronary Artery Disease (CAD) CAD: Prior events / interventions (include dates if available): 01/2016: cath showing 50% LM, 40% distal LAD, 70% ost to prox LCs, 40% mid LAD and 95% mid to dist RCA s/p PCI with DES x 2 and stage PCI of the LCx/OM. Does patient have an Rx for nitroglycerin?: No Has patient experienced any chest pain?: No We discussed: Warning signs of stroke, Importance of controlling other comorbid conditions, Warning signs of MI Assessment:: Controlled Drug: Atorvastatin 80mg  QD Assessment: Appropriate, Effective, Safe, Accessible  Hypothyroidism Current TSH: 3.22 taken on: 02/09/2021 Previous TSH: 2.50 taken on: 11/05/2020 Assess this condition today?: Yes Patient has experienced the following symptoms: Cold intolerance We discussed: Monitoring for signs / symptoms of hypothyroidism (weight gain, fatigue, hair loss, cold intolerance), Proper administration of levothyroxine (empty stomach, 30 min before breakfast and with no other meds / vitamins) Assessment:: Controlled Drug: Levothyroxine 100 mcg 1 tab QD Assessment: Appropriate, Effective, Safe, Accessible HC Follow up: N/A Pharmacist Follow up: continue to monitor TSH level  Exercise, Diet and Non-Drug Coordination  Needs Additional exercise counseling points. We discussed: incorporating flexibility, balance, and strength training exercises, targeting at least 150 minutes per week of moderate-intensity aerobic  exercise., decreasing sedentary behavior Additional diet counseling points. We discussed: key components of a low-carb eating plan, aiming to consume at least 8 cups of water day Discussed Non-Drug Care Coordination Needs: Yes Does Patient have Medication financial barriers?: No  Accountable Health Communities Health-Related Social Needs Screening Tool -  SDOH  ([email or web address]) What is your living situation today? (ref #1): I have a steady place to live Think about the place you live. Do you have problems with any of the following? (ref #2): None of the above Within the past 12 months, you worried that your food would run out before you got money to buy more (ref #3): Never true Within the past 12 months, the food you bought just didn't last and you didn't have money to get more (ref #4): Never true In the past 12 months, has lack of reliable transportation kept you from medical appointments, meetings, work or from getting things needed for daily living? (ref #5): No In the past 12 months, has the electric, gas, oil, or water company threatened to shut off services in your home? (ref #6): No How often does anyone, including family and friends, physically hurt you? (ref #7): Never (1) How often does anyone, including family and friends, insult or talk down to you? (ref #8): Never (1) How often does anyone, including friends and family, threaten you with harm? (ref #9): Never (1) How often does anyone, including family and friends, scream or curse at you? (ref #10): Never (1)  Engagement Notes Newton Pigg on 05/01/2021 09:57 AM CPP Chart Review: 14 min  CPP Office Visit: 40 min  CPP Office Visit Documentation: 12 min  CPP Coordination of  Care:  CPP Care Plan Completion and Review:  32 min Cost Analysis- 74mins HC Chart Prep: 54mins  Engagement Notes Newton Pigg on 05/01/2021 09:57 AM HC F/u: N/A CPP F/u:  05/13/22: Ok [facility] w [facility] 08/14/21: Ok [facility] w/ [facility] 09/16/21 @1 :30pm: CCM follow up in person visit  Care Gaps:  Pt will get flu vaccine this week then will get  COVID vaccine next week.   Patient Name:? Rosekrans,Derryl   DOB:?? 08/12/1950 ?  Last Care Plan Update:? 05/01/2021 COMPREHENSIVE CARE PLAN AND GOALS?     HYPERTENSION   MOST RECENT BLOOD PRESSURE:      134/76 on 05/21/2021 MY GOAL BLOOD PRESSURE:   <130/80 mmH CURRENT MEDICATION AND DOSING:   Amlodipine 10mg  daily Carvedilol 12.5mg  2 times a day Olmesartan 40mg  daily THE GOALS WE HAVE CHOSEN ARE:     -Maintain an at goal blood pressure   BARRIERS TO ACHIEVING GOALS:   -None, controlled PLAN TO WORK ON THESE GOALS:   -Take medications regularly    -Check BP at home   -Reduce salt intake (< 1500mg / day)   -Diet: DASH diet (Choose fruits, vegetables, and low-fat dairy products. Increase whole grains, fish, poultry, nuts. Reduce red meats and sugars)   -Weight: 1 kg = ~1 mmHg reduction   -Exercise    CHOLESTEROL   MOST RECENT LABS:     02/09/2021 -TOTAL CHOLESTEROL: 96  -TRIGLYCERIDES: 189  -HDL: 37  -LDL: 33  CURRENT MEDICATION AND DOSING:   Atorvastatin 80mg  daily THE GOALS WE HAVE CHOSEN ARE:     -Total Cholesterol goal under 200, Triglycerides goal under 150, HDL goal above 40, LDL goal under 100   BARRIERS TO ACHIEVING GOALS:   -Diet PLAN TO WORK ON THESE GOALS:   -Take medication regularly   -Diet: high in plant sterols (fruits/  vegetables/ nuts/ whole grains/ legumes). Increase fiber intake (10-25g/day). Avoid foods high in cholesterol (red meat, egg yolks, dairy, oils/ butter). Choose low-fat options.   -Watch for foods high in triglycerides such as fried foods, dairy products, cakes, cookies, sugary drinks -Exercise   -Weight Management      DIABETES   MOST RECENT A1C:   7.1% on 02/09/2021      MY GOAL A1C:   <7% CURRENT MEDICATION AND DOSING:  Metformin XR 500mg  2 tabs 2 times a day THE GOALS WE HAVE CHOSEN ARE:    -Maintain an at goal A1C level   BARRIERS TO ACHIEVING GOALS:   -Diet  PLAN TO WORK ON THESE GOALS:   -Take medications as prescribed   -Check blood glucose daily    -Diet: natural carbs and sugars (vegetables, fruits, whole grains, legumes, dairy), avoid alcohol, reduce carbohydrate intake and processed sugars. Maintain a low carbohydrate diet, eating 45 grams of carbohydrates per meal (3 servings of carbohydrates per meal), 15 grams of carbohydrates per snack (1 serving of carbohydrate per snack)   -Weight Loss: waist circumference goal < 35 inches for females; < 40 inches for males   -Exercise: 150 minutes of moderate-intensity aerobic activity per week (at least 3 days)     -Discussed Technique: testing / injection as applicable   -Foot care: wash, dry examine feet daily. Moisturize the top and bottom (not in between). Trim nails with nail file (no sharp edges). Wear socks and shoes. Elevate feet when sitting. Annual foot exam by podiatrist.     -Recognize sign and symptoms of hypoglycemia and understand treatment as outlined below   -Hypoglycemia sign and symptoms: dizziness, anxiety/ irritability, shakiness, headache, diaphoresis, hunger, confusion, nausea, ataxia, tremors, palpitations/ tachycardia, blurred vision   -Hypoglycemia Treatment: Rule of 15:    (1) 15-20 grams of glucose/ simple carb (4 oz juice, 8oz milk, 4oz regular soda, 1 tablespoon sugar/ honey/ corn syrup, 3-4 glucose tabs/ 1 serving glucose gel)   (2) Recheck BG after 15 mins. If hypoglycemia continues   (3) Repeat steps 1&2, when BG normalizes, eat small meal or snack      ATRIAL FIBRILLATION   PATIENT IS CURRENTLY RHYTHM/RATE CONTROLLED:   Rate controlled CURRENT MEDICATION AND DOSING:   Warfarin 5mg  1 tablet daily on  Thursdays and Sundays and 1.5 tablets all other days Carvedilol 12.5mg  Take 1 tablet by mouth 2 times daily with a meal THE GOALS WE HAVE CHOSEN ARE:   -Maintain normal heart rhythm -Prevent stroke or heart attack BARRIERS TO ACHIEVING GOALS:   -None, controlled PLAN TO WORK ON THESE GOALS:  -Continue taking medications as prescribed ??  GOUT   URIC ACID/DATE:    4.5 on 11/05/2020 CURRENT MEDICATION AND DOSING:  Allopurinol 300mg  daily THE GOALS WE HAVE CHOSEN ARE:     -Maintain appropriate uric acid level and reduce gout flares   BARRIERS TO ACHIEVING GOALS:   -none, controlled PLAN TO WORK ON THESE GOALS:   -Continue medication regularly   -Avoid foods that worsen gout symptoms    HYPOTHYROIDISM   RECENT TSH/DATE:   3.22 on 02/09/2021 CURRENT MEDICATION AND DOSING:   Levothyroxine 100 mcg 1 tab daily THE GOALS WE HAVE CHOSEN ARE:    -Maintain TSH between 0.45 to 4.5uIU/ml   BARRIERS TO ACHIEVING GOALS:   -None, controlled PLAN TO WORK ON THESE GOALS:     -Continue Medication   -Take medication with water at the same time each day for consistent absorption  at least 1 hour before breakfast or at bedtime (3 hours after last meal)     -Recognize signs and symptoms of thyroid imbalance:   -Hyperthyroid: Heat intolerance (increased sweating), weight loss, agitation/ nervousness/ irritability/ anxiety, palpitations/ tachycardia, fatigue/ muscle weakness, diarrhea, insomnia, tremor, thinning hair, exophthalmos   -Hypothyroid: cold intolerance, dry skin, fatigue, muscle cramps, voice changes, constipation, weight gain, myalgias, weakness, depression, bradycardia, hair loss, memory/ mental impairment  ? ?  ACTIVE MEDICATION LIST   Your current medication list has been updated. To view, log in to your patient portal.   Call if any changes need to be made.      MEDICATION REVIEW   MEDICATION REVIEW CONDUCTED:   Yes    DATE:    05/01/2021  BEST POSSIBLE MEDICATION HISTORY  SOURCE:    Medical Records  ??    HOW DO I? - WHEN DO I?     GET AHOLD OF MY DOCTOR?    DURING BUSINESS HOURS WHEN THE OFFICE IS OPEN    PHONE: [phone]  AFTER HOURS [facility] NURSE WHEN THE OFFICE IS CLOSED   PHONE: [phone]   TALK TO [facility] CARE COORDINATOR NAME: Newton Pigg   PHONE: [phone]  EMAIL:  [email or web address]  CARE COORDINATOR STAFF   NAME: Rosary Lively, Marlene Bast, Sandria Bales   PHONE: [phone]    NOTE SECTION   Thank you for participating in the Chronic Care Management (CCM) program with Dr. Newton Pigg     This program takes a proactive approach to your health and my team will serve as a resource for you throughout the year. Please follow up at [phone] if you have any questions or experience changes to your overall health. Your next CCM appointment will be conducted with Newton Pigg, PharmD as follows:      Date: 09/16/2021    Time:   1:30pm In Office  ?     Rachelle Hora Jeannett Senior, PharmD  Clinical Pharmacist  [email or web address]  [phone]

## 2021-05-12 ENCOUNTER — Encounter: Payer: Self-pay | Admitting: Internal Medicine

## 2021-05-12 NOTE — Progress Notes (Signed)
Future Appointments  Date Time Provider Department  05/13/2021          6 mo OV   3:30 PM [name], MD [facility]  05/22/2021  1:30 PM [facility]MADIN CLINIC [facility]  07/01/2021  2:15 PM Constance Haw, MD [facility]  08/14/2021            Wellness  2:30 PM Liane Comber, NP [facility]  09/16/2021  1:30 PM Newton Pigg, [facility]  11/06/2021             CPE   3:00 PM [name], MD [facility]    History of Present Illness:       This very nice 70 y.o. single WM presents for 6 month follow up with HTN, ASHD/pAfib, HLD, T2_NIDDM  and Vitamin D Deficiency. Patient has hx/o OSA , but is mask intolerant.  Patient has Barrett's Esophagus followed by Dr Carlean Purl .   Patient's Gout is controlled on his meds.        Patient is treated for HTN  (2006) & BP has been controlled at home. Todays BP: 139/73.  OIn 2016 , he was dx'd with Afib - failing CV x 3 and then had an Raymond in 2018 and remains on Coumadin followed at [facility].   Patient has had no complaints of any cardiac type chest pain, palpitations, dyspnea / orthopnea / PND, dizziness, claudication, or dependent edema.       Hyperlipidemia is controlled with diet & meds. Patient denies myalgias or other med SEs. Last Lipids were at goal except sl elevated Trig's :   Lab Results  Component Value Date   CHOL 96 02/10/2021   HDL 37 (L) 02/10/2021   LDLCALC 33 02/10/2021   TRIG 189 (H) 02/10/2021   CHOLHDL 2.6 02/10/2021     Also, the patient has history of Morbid Obesity (BMI 35+) with  consequent T2_DM (2012) w/CKD2  (GFR 74) .  Patient has had no symptoms of reactive hypoglycemia, diabetic polys, paresthesias or visual blurring.  Last A1c was not at goal :  Lab Results  Component Value Date   HGBA1C 7.1 (H) 02/10/2021                      Patient has been on  Thyroid Replacement since 2011.                                                     Further, the patient also has history of  Vitamin D Deficiency  ("23" /2009) and supplements vitamin D without any suspected side-effects. Last vitamin D was still sl low (goal 70-100) :   Lab Results  Component Value Date   VD25OH 48 11/06/2020     Current Outpatient Medications on File Prior to Visit  Medication Sig   acetaminophen 325 MG tablet Take 325 mg as needed for mild pain.   allopurinol  300 MG tablet TAKE 1 TABLET DAILY    amLODipine 10 MG tablet Take 1 tab  Daily    VITAMIN C  500 MG tablet Take daily.   atorvastatin 80 MG tablet Take daily   B Complex Vitamins  Take 1 tablet daily.   carvedilol 12.5 MG tablet Take 25 mg  2 times daily    VITAMIN D 1000 UNITS  Take 2,000 Units  daily.   colchicine 0.6 MG tablet Take 1 tablet daily    levothyroxine 100 MCG tablet TAKE 1 TABLET DAILY    loratadine  10 MG tablet Take daily.   magnesium 500 MG tablet Take 1,000 mg daily.    metFORMIN-XR 500 MG Take 2 tabs 2 x /day w/Meals    olmesartan 40 MG tablet TAKE 1 TABLET DAILY FOR BLOOD PRESSURE   Omega-3 FISH OIL 1200 MG  Take   daily.   pantoprazole 40 MG tablet TAKE 1 TABLET EVERY DAY BEFORE BREAKFAST   warfarin 5 MG tablet Take 1 tablet on Thurs /Sun and 1.5 tablets all other days.   zinc 50 MG tablet Take daily.     Allergies  Allergen Reactions   Amiodarone Other (See Comments)    Lung toxicity   Bee Venom Other (See Comments)    Leg went numb     PMHx:   Past Medical History:  Diagnosis Date   AKI (acute kidney injury) ([place]) 05/27/2020   Arthritis    "joints get stiff" (06/11/2016)   Bee sting allergy    Bee sting allergy    CAD (coronary artery disease), native coronary artery 02/12/2016   cath showing 50% LM, 40% distal LAD, 70% ost to prox LCs, 40% mid LAD and 95% mid to dist RCA s/p PCI with DES x 2 and stage PCI of the LCx/OM.   Carotid artery stenosis    1-39% bilateral by dopplers 08/2017   History of gout    Hx of adenomatous colonic polyps 04/10/2008   Hyperlipidemia    Hypertension     Hypothyroidism    OSA (obstructive sleep apnea)    "tried CPAP; couldn't do it" (06/11/2016)   Persistent atrial fibrillation ([place])    s/p DCCV 03/2015 with reversion back to atrial fibrillation, loaded with Amio and s/p DCCV to NSR 11/2015.  He is now s/p afib albation.  He had amio lung toxicity and amio was stopped.   Thyroid disease    Type II diabetes mellitus ([place])    Vitamin D deficiency      Immunization History  Administered Date(s) Administered   Influenza Split 04/05/2014   Influenza, High Dose 03/31/2018, 03/20/2019, 05/07/2020   Influenza 02/02/2013, 05/21/2016   PFIZER SARS-COV-2 Vacc 12/13/2019, 01/03/2020, 07/09/2020   Pneumococcal -13 05/01/2014   Pneumococcal -23 05/16/2015   Td 04/25/2010, 05/07/2020   Varicella Zoster Immune Globulin 02/22/2014     Past Surgical History:  Procedure Laterality Date   BIOPSY  05/28/2020   BIOPSY;  Jerene Bears, MD   CARDIAC CATHETERIZATION N/A 10/11/2015    Left Heart Cath and Coronary Angiography;  Surgeon: [name], MD   CARDIAC CATHETERIZATION N/A 10/11/2015   Coronary Stent Intervention;  [name], MD   CARDIAC CATHETERIZATION N/A 10/11/2015   Intravascular Pressure Wire/FFR Study; [name], MD   CARDIAC CATHETERIZATION N/A 10/14/2015   Coronary Stent Intervention; Nelida Gores, MD   CARDIOVERSION N/A 04/05/2015   CARDIOVERSION;Traci Remonia Richter, MD   CARDIOVERSION N/A 12/09/2015   CARDIOVERSION;Traci Remonia Richter, MD   CARDIOVERSION N/A 03/16/2016   CARDIOVERSION;  [name], MD   CATARACT EXTRACTION Right 2019   Dr. Patrici Ranks   CORONARY ANGIOPLASTY     ELECTROPHYSIOLOGIC STUDY N/A 06/11/2016   Atrial Fibrillation Ablation;  Will [name], MD   EGD) WITH PROPOFOL N/A 05/28/2020   EGD) WITH PROPOFOL; Pyrtle, Lajuan Lines, MD   TOTAL HIP ARTHROPLASTY Bilateral 2001   UVULECTOMY  FHx:    Reviewed / unchanged  SHx:    Reviewed / unchanged   Systems Review:  Constitutional: Denies fever,  chills, wt changes, headaches, insomnia, fatigue, night sweats, change in appetite. Eyes: Denies redness, blurred vision, diplopia, discharge, itchy, watery eyes.  ENT: Denies discharge, congestion, post nasal drip, epistaxis, sore throat, earache, hearing loss, dental pain, tinnitus, vertigo, sinus pain, snoring.  CV: Denies chest pain, palpitations, irregular heartbeat, syncope, dyspnea, diaphoresis, orthopnea, PND, claudication or edema. Respiratory: denies cough, dyspnea, DOE, pleurisy, hoarseness, laryngitis, wheezing.  Gastrointestinal: Denies dysphagia, odynophagia, heartburn, reflux, water brash, abdominal pain or cramps, nausea, vomiting, bloating, diarrhea, constipation, hematemesis, melena, hematochezia  or hemorrhoids. Genitourinary: Denies dysuria, frequency, urgency, nocturia, hesitancy, discharge, hematuria or flank pain. Musculoskeletal: Denies arthralgias, myalgias, stiffness, jt. swelling, pain, limping or strain/sprain.  Skin: Denies pruritus, rash, hives, warts, acne, eczema or change in skin lesion(s). Neuro: No weakness, tremor, incoordination, spasms, paresthesia or pain. Psychiatric: Denies confusion, memory loss or sensory loss. Endo: Denies change in weight, skin or hair change.  Heme/Lymph: No excessive bleeding, bruising or enlarged lymph nodes.  Physical Exam  BP 139/73    Pulse 72    Temp 97.9 F (36.6 C)    Resp 16    Ht 6' (1.829 m)    Wt 253 lb 6.4 oz (114.9 kg)    SpO2 94%    BMI 34.37 kg/m   Appears  well nourished, well groomed  and in no distress.  Eyes: PERRLA, EOMs, conjunctiva no swelling or erythema. Sinuses: No frontal/maxillary tenderness ENT/Mouth: EAC's clear, TM's nl w/o erythema, bulging. Nares clear w/o erythema, swelling, exudates. Oropharynx clear without erythema or exudates. Oral hygiene is good. Tongue normal, non obstructing. Hearing intact.  Neck: Supple. Thyroid not palpable. Car 2+/2+ without bruits, nodes or JVD. Chest: Respirations  nl with BS clear & equal w/o rales, rhonchi, wheezing or stridor.  Cor: Heart sounds normal w/ regular rate and rhythm without sig. murmurs, gallops, clicks or rubs. Peripheral pulses normal and equal  without edema.  Abdomen: Soft & bowel sounds normal. Non-tender w/o guarding, rebound, hernias, masses or organomegaly.  Lymphatics: Unremarkable.  Musculoskeletal: Full ROM all peripheral extremities, joint stability, 5/5 strength and normal gait.  Skin: Warm, dry without exposed rashes, lesions or ecchymosis apparent.  Neuro: Cranial nerves intact, reflexes equal bilaterally. Sensory-motor testing grossly intact. Tendon reflexes grossly intact.  Pysch: Alert & oriented x 3.  Insight and judgement nl & appropriate. No ideations.  Assessment and Plan:  1. Essential hypertension  - Continue medication, monitor blood pressure at home.  - Continue DASH diet.  Reminder to go to the ER if any CP,  SOB, nausea, dizziness, severe HA, changes vision/speech.   - CBC with Differential/Platelet - COMPLETE METABOLIC PANEL WITH GFR - Magnesium - TSH  2. Hyperlipidemia associated with type 2 diabetes mellitus ([place])  - Continue diet/meds, exercise,& lifestyle modifications.  - Continue monitor periodic cholesterol/liver & renal functions    - Lipid panel - TSH  3. Type 2 diabetes mellitus with stage 2 chronic kidney  disease, without long-term current use of insulin (HCC)  - Continue diet, exercise  - Lifestyle modifications.  - Monitor appropriate labs   - Hemoglobin A1c - Insulin, random  4. Vitamin D deficiency  - Continue supplementation   - VITAMIN D 25 Hydroxy  5. Hypothyroidism  - TSH  6. Chronic gout   - Uric acid  7. Medication management  - CBC with Differential/Platelet - COMPLETE METABOLIC PANEL  WITH GFR - Magnesium - Lipid panel - TSH - Hemoglobin A1c - Insulin, random - VITAMIN D 25 Hydroxy  - Uric acid      Discussed  regular exercise, BP monitoring,  weight control to achieve/maintain BMI less than 25 and discussed med and SE's. Recommended labs to assess and monitor clinical status with further disposition pending results of labs.  I discussed the assessment and treatment plan with the patient. The patient was provided an opportunity to ask questions and all were answered. The patient agreed with the plan and demonstrated an understanding of the instructions.  I provided over 30 minutes of exam, counseling, chart review and  complex critical decision making.        The patient was advised to call back or seek an in-person evaluation if the symptoms worsen or if the condition fails to improve as anticipated.   Kirtland Bouchard, MD

## 2021-05-12 NOTE — Patient Instructions (Signed)

## 2021-05-13 ENCOUNTER — Ambulatory Visit (INDEPENDENT_AMBULATORY_CARE_PROVIDER_SITE_OTHER): Payer: Medicare Other | Admitting: Internal Medicine

## 2021-05-13 ENCOUNTER — Other Ambulatory Visit: Payer: Self-pay

## 2021-05-13 ENCOUNTER — Encounter: Payer: Self-pay | Admitting: Internal Medicine

## 2021-05-13 VITALS — BP 139/73 | HR 72 | Temp 97.9°F | Resp 16 | Ht 72.0 in | Wt 253.4 lb

## 2021-05-13 DIAGNOSIS — E039 Hypothyroidism, unspecified: Secondary | ICD-10-CM

## 2021-05-13 DIAGNOSIS — E1122 Type 2 diabetes mellitus with diabetic chronic kidney disease: Secondary | ICD-10-CM | POA: Diagnosis not present

## 2021-05-13 DIAGNOSIS — E785 Hyperlipidemia, unspecified: Secondary | ICD-10-CM

## 2021-05-13 DIAGNOSIS — Z79899 Other long term (current) drug therapy: Secondary | ICD-10-CM

## 2021-05-13 DIAGNOSIS — E1169 Type 2 diabetes mellitus with other specified complication: Secondary | ICD-10-CM

## 2021-05-13 DIAGNOSIS — N182 Chronic kidney disease, stage 2 (mild): Secondary | ICD-10-CM

## 2021-05-13 DIAGNOSIS — E559 Vitamin D deficiency, unspecified: Secondary | ICD-10-CM

## 2021-05-13 DIAGNOSIS — M1A9XX0 Chronic gout, unspecified, without tophus (tophi): Secondary | ICD-10-CM

## 2021-05-13 DIAGNOSIS — I10 Essential (primary) hypertension: Secondary | ICD-10-CM

## 2021-05-14 LAB — CBC WITH DIFFERENTIAL/PLATELET
Absolute Monocytes: 697 cells/uL (ref 200–950)
Basophils Absolute: 52 cells/uL (ref 0–200)
Basophils Relative: 0.6 %
Eosinophils Absolute: 129 cells/uL (ref 15–500)
Eosinophils Relative: 1.5 %
HCT: 42.5 % (ref 38.5–50.0)
Hemoglobin: 13.6 g/dL (ref 13.2–17.1)
Lymphs Abs: 1806 cells/uL (ref 850–3900)
MCH: 30.1 pg (ref 27.0–33.0)
MCHC: 32 g/dL (ref 32.0–36.0)
MCV: 94 fL (ref 80.0–100.0)
MPV: 10.1 fL (ref 7.5–12.5)
Monocytes Relative: 8.1 %
Neutro Abs: 5917 cells/uL (ref 1500–7800)
Neutrophils Relative %: 68.8 %
Platelets: 310 10*3/uL (ref 140–400)
RBC: 4.52 10*6/uL (ref 4.20–5.80)
RDW: 15.6 % — ABNORMAL HIGH (ref 11.0–15.0)
Total Lymphocyte: 21 %
WBC: 8.6 10*3/uL (ref 3.8–10.8)

## 2021-05-14 LAB — COMPLETE METABOLIC PANEL WITH GFR
AG Ratio: 1.2 (calc) (ref 1.0–2.5)
ALT: 18 U/L (ref 9–46)
AST: 19 U/L (ref 10–35)
Albumin: 4.2 g/dL (ref 3.6–5.1)
Alkaline phosphatase (APISO): 99 U/L (ref 35–144)
BUN: 17 mg/dL (ref 7–25)
CO2: 26 mmol/L (ref 20–32)
Calcium: 9.3 mg/dL (ref 8.6–10.3)
Chloride: 99 mmol/L (ref 98–110)
Creat: 1.14 mg/dL (ref 0.70–1.28)
Globulin: 3.5 g/dL (calc) (ref 1.9–3.7)
Glucose, Bld: 180 mg/dL — ABNORMAL HIGH (ref 65–99)
Potassium: 3.8 mmol/L (ref 3.5–5.3)
Sodium: 139 mmol/L (ref 135–146)
Total Bilirubin: 1 mg/dL (ref 0.2–1.2)
Total Protein: 7.7 g/dL (ref 6.1–8.1)
eGFR: 69 mL/min/{1.73_m2} (ref >=60)

## 2021-05-14 LAB — LIPID PANEL
Cholesterol: 82 mg/dL (ref <200)
HDL: 30 mg/dL — ABNORMAL LOW (ref >=40)
LDL Cholesterol (Calc): 29 mg/dL (calc)
Non-HDL Cholesterol (Calc): 52 mg/dL (calc) (ref <130)
Total CHOL/HDL Ratio: 2.7 (calc) (ref <5.0)
Triglycerides: 146 mg/dL (ref <150)

## 2021-05-14 LAB — HEMOGLOBIN A1C
Hgb A1c MFr Bld: 7.6 % of total Hgb — ABNORMAL HIGH (ref <5.7)
Mean Plasma Glucose: 171 mg/dL
eAG (mmol/L): 9.5 mmol/L

## 2021-05-14 LAB — MAGNESIUM: Magnesium: 1.4 mg/dL — ABNORMAL LOW (ref 1.5–2.5)

## 2021-05-14 LAB — TSH: TSH: 2.7 mIU/L (ref 0.40–4.50)

## 2021-05-14 LAB — URIC ACID: Uric Acid, Serum: 4.1 mg/dL (ref 4.0–8.0)

## 2021-05-14 LAB — INSULIN, RANDOM: Insulin: 34.7 u[IU]/mL — ABNORMAL HIGH

## 2021-05-14 LAB — VITAMIN D 25 HYDROXY (VIT D DEFICIENCY, FRACTURES): Vit D, 25-Hydroxy: 56 ng/mL (ref 30–100)

## 2021-05-14 NOTE — Progress Notes (Signed)
============================================================ °============================================================ ° °-     Magnesium  -   1.4   -  is  very  low- goal is betw 2.0 - 2.5,   - So..............[name]  Recommend that you increase from Magnesium 1,000 mg tablet 2 x /day with your Metformin   - also important to eat lots of  leafy green vegetables   - spinach - Kale - collards - greens - okra - asparagus  - broccoli - quinoa - squash - almonds   - black, red, white beans  -  peas - green beans ============================================================ ============================================================  -  Total chol = 82     &  LDL Chol = 29  -  -   Both  Excellent   - Very low risk for Heart Attack  / Stroke ============================================================ ============================================================  -  Glucose is 180  mg% - Elevated   ( goal or Ideal is less than 120 mg % )  &   - A1c = 7.6 % - worse  ( was 7.1% )   -  Being diabetic has a  300% increased risk for heart attack,  stroke, cancer, and alzheimer- type vascular dementia.   It is very important that you work harder with diet by  avoiding all foods that are white except chicken, fish & calliflower.  - Avoid white rice  (brown & wild rice is OK),   - Avoid white potatoes  (sweet potatoes in moderation is OK),   White bread or wheat bread or anything made out of   white flour like bagels, donuts, rolls, buns, biscuits, cakes,  - pastries, cookies, pizza crust, and pasta (made from  white flour & egg whites)   - vegetarian pasta or spinach or wheat pasta is OK.  - Multigrain breads like Arnold's, Pepperidge Farm or   multigrain sandwich thins or high fiber breads like   [place] bread or "Dave's Killer" breads that are  4 to 5 grams fiber per slice !  are best.    Diet, exercise and weight loss can reverse and cure  diabetes in the early stages.    ============================================================ ============================================================  -  Vitamin D = 56 - OK ============================================================ ============================================================  -  Uric Acid  / Gout test - Normal & OK  ============================================================ ============================================================  -  All Else - CBC - Kidneys - Electrolytes - Liver - Magnesium & Thyroid    - all  Normal / OK ============================================================ ============================================================

## 2021-05-15 NOTE — Progress Notes (Signed)
Lvm asking the pt to call back with lab results

## 2021-05-20 ENCOUNTER — Other Ambulatory Visit: Payer: Self-pay | Admitting: Internal Medicine

## 2021-05-20 DIAGNOSIS — R609 Edema, unspecified: Secondary | ICD-10-CM

## 2021-05-20 DIAGNOSIS — O1202 Gestational edema, second trimester: Secondary | ICD-10-CM

## 2021-05-20 MED ORDER — FUROSEMIDE 40 MG PO TABS: ORAL_TABLET | ORAL | 1 refills | Status: DC

## 2021-05-22 ENCOUNTER — Other Ambulatory Visit: Payer: Self-pay

## 2021-05-22 ENCOUNTER — Ambulatory Visit: Payer: Medicare Other

## 2021-05-22 DIAGNOSIS — N182 Chronic kidney disease, stage 2 (mild): Secondary | ICD-10-CM | POA: Diagnosis not present

## 2021-05-22 DIAGNOSIS — I10 Essential (primary) hypertension: Secondary | ICD-10-CM | POA: Diagnosis not present

## 2021-05-22 DIAGNOSIS — I4819 Other persistent atrial fibrillation: Secondary | ICD-10-CM

## 2021-05-22 DIAGNOSIS — I700 Atherosclerosis of aorta: Secondary | ICD-10-CM | POA: Diagnosis not present

## 2021-05-22 DIAGNOSIS — Z5181 Encounter for therapeutic drug level monitoring: Secondary | ICD-10-CM

## 2021-05-22 DIAGNOSIS — E1122 Type 2 diabetes mellitus with diabetic chronic kidney disease: Secondary | ICD-10-CM | POA: Diagnosis not present

## 2021-05-22 LAB — POCT INR: INR: 3.1 — AB (ref 2.0–3.0)

## 2021-05-22 NOTE — Patient Instructions (Signed)
Description   Continue taking Warfarin 1.5 tablets daily except 1 tablet on Sundays and Thursdays. Recheck INR in 6 weeks. Coumadin Clinic: [phone] Main number: [phone] Fax number [phone]

## 2021-05-27 ENCOUNTER — Telehealth: Payer: Self-pay | Admitting: *Deleted

## 2021-05-27 NOTE — Telephone Encounter (Signed)
° °  Patient Name: Christopher Wade  DOB: 1951/01/27 MRN: [id number]  Primary Cardiologist: Fransico Him, MD  Chart reviewed as part of pre-operative protocol coverage.   Simple dental extractions are considered low risk procedures per guidelines and generally do not require any specific cardiac clearance. It is also generally accepted that for simple extractions and dental cleanings, there is no need to interrupt blood thinner therapy.  SBE prophylaxis is not required for the patient from a cardiac standpoint.  I will route this recommendation to the requesting party via Epic fax function and remove from pre-op pool.  Please call with questions.  [place], [place] 05/27/2021, 3:38 PM

## 2021-05-27 NOTE — Telephone Encounter (Signed)
° °  Pre-operative Risk Assessment    Patient Name: Christopher Wade  DOB: 1950-06-06 MRN: [id number]     Request for Surgical Clearance    Procedure:  Dental Extraction - Amount of Teeth to be Pulled:  1  Date of Surgery:  Clearance TBD                                 Surgeon:   DR. Veva Holes Surgeon's Group or Practice Name:  [facility] Phone number:  [phone] Fax number:  [phone]   Type of Clearance Requested:      Type of Anesthesia:  Local    Additional requests/questions:    Signed, Jeanann Lewandowsky   05/27/2021, 3:09 PM

## 2021-05-27 NOTE — Progress Notes (Signed)
Assessment and Plan: Christopher Wade was seen today for acute visit.  Diagnoses and all orders for this visit:  Aortic atherosclerosis ([place]) by Chest CT scan in 2018 Control blood pressure, cholesterol, glucose, increase exercise  Morbid obesity (HCC) - BMI 30+ with OSA Long discussion about weight loss, diet, and exercise Recommended diet heavy in fruits and veggies and low in animal meats, cheeses, and dairy products, appropriate calorie intake Discussed ideal weight for height, goal <225lb  Paroxysmal atrial fibrillation (HCC) Doing well s/p ablation off AAD therapy Continue coumadin via cardiology for [facility] of 4 Continue following up with cardio To have molar tooth extraction, message sent to [name] to determine if Coumadin dose needs reduced prior to extraction -     Protime-INR drawn today to determine if less than 3  Essential hypertension -     carvedilol (COREG) 12.5 MG tablet; Take 2 tablets (25 mg total) by mouth 2 (two) times daily with a meal. - continue medications, DASH diet, exercise and monitor at home. Call if greater than 130/80.   Go to the ER if any chest pain, shortness of breath, nausea, dizziness, severe HA, changes vision/speech   Hyperlipidemia associated with type 2 diabetes mellitus (HCC) Currently at LDL goal; continue statin - reduce from Lipitor 80 mg to 40 mg if remains aggressively controlled; diet for trigs discussed Continue low cholesterol diet and exercise.   Medication management Continued  Long term current use of anticoagulant therapy -     Protime-INR Continue Coumadin 5mg  1.5 tab PO QD , 1 tab Thursday and Sunday      Further disposition pending results of labs. Discussed med's effects and SE's.   Over 30 minutes of exam, counseling, chart review, and critical decision making was performed.   Future Appointments  Date Time Provider [place]  07/03/2021  1:30 PM [facility]  08/08/2021   4:15 PM Camnitz, Ocie Doyne, MD [facility]  08/14/2021  2:30 PM Liane Comber, NP [facility] None  09/16/2021  1:30 PM Newton Pigg, [place] [facility] None  11/19/2021  2:00 PM [name], MD [facility] None    ------------------------------------------------------------------------------------------------------------------   HPI BP 140/72    Pulse 60    Temp (!) 97.5 F (36.4 C)    Wt 258 lb 12.8 oz (117.4 kg)    SpO2 94%    BMI 35.10 kg/m  71 y.o.male presents for clearance for upcoming dental surgery  He is currently on Carvedilol 12.5 mg 2 tabs BID and Olmesartan 40 mg QD. His blood pressures have been controlled at home running : 130's/70's BP Readings from Last 3 Encounters:  05/28/21 140/72  05/13/21 139/73  05/01/21 134/76    He has aortic atherosclerosis per CT chest in 2018.  Patient has history of Afib (2016) and had CV x 2 (2016 & 2017) and then Jan 2018 had an Afib Ablation (Dr Curt Bears) without noted recurrence, does remain on coumadin managed by coumadin clinic (couldn't afford xarelto) followed by Dr Fransico Him  BMI is Body mass index is 35.1 kg/m., he has been working on diet and exercise. Wt Readings from Last 3 Encounters:  05/28/21 258 lb 12.8 oz (117.4 kg)  05/13/21 253 lb 6.4 oz (114.9 kg)  04/24/21 256 lb (116.1 kg)    He is on cholesterol medication, Atorvastatin 80 mg daily and denies Myalgias Lab Results  Component Value Date   CHOL 82 05/13/2021   HDL 30 (L) 05/13/2021   LDLCALC 29 05/13/2021  TRIG 146 05/13/2021   CHOLHDL 2.7 05/13/2021    He is currently on Coumadin 5 mg 1.5 tabs PO QD and 1 tab Thursday and Sunday Last INR 3.1 on 05/22/21.  Will recheck today as they would like INR to be less than 3 for molar extraction.   Past Medical History:  Diagnosis Date   AKI (acute kidney injury) ([place]) 05/27/2020   Arthritis    "joints get stiff" (06/11/2016)   Bee sting allergy    Bee sting allergy    CAD (coronary artery  disease), native coronary artery 02/12/2016   cath showing 50% LM, 40% distal LAD, 70% ost to prox LCs, 40% mid LAD and 95% mid to dist RCA s/p PCI with DES x 2 and stage PCI of the LCx/OM.   Carotid artery stenosis    1-39% bilateral by dopplers 08/2017   History of gout    Hx of adenomatous colonic polyps 04/10/2008   Hyperlipidemia    Hypertension    Hypothyroidism    OSA (obstructive sleep apnea)    "tried CPAP; couldn't do it" (06/11/2016)   Persistent atrial fibrillation ([place])    s/p DCCV 03/2015 with reversion back to atrial fibrillation, loaded with Amio and s/p DCCV to NSR 11/2015.  He is now s/p afib albation.  He had amio lung toxicity and amio was stopped.   Thyroid disease    Type II diabetes mellitus (HCC)    Vitamin D deficiency      Allergies  Allergen Reactions   Amiodarone Other (See Comments)    Lung toxicity   Bee Venom Other (See Comments)    Leg went numb    Current Outpatient Medications on File Prior to Visit  Medication Sig   acetaminophen (TYLENOL) 325 MG tablet Take 325 mg by mouth as needed for mild pain.   allopurinol (ZYLOPRIM) 300 MG tablet TAKE 1 TABLET BY MOUTH DAILY FOR GOUT PREVENTION   amLODipine (NORVASC) 10 MG tablet Take 1 tab  Daily For BP & Heart   Ascorbic Acid (VITAMIN C WITH ROSE HIPS) 500 MG tablet Take 500 mg by mouth daily.   atorvastatin (LIPITOR) 80 MG tablet Take 0.5 tablets (40 mg total) by mouth daily. (Patient taking differently: Take 80 mg by mouth daily.)   B Complex Vitamins (B COMPLEX PO) Take 1 tablet by mouth daily.   carvedilol (COREG) 12.5 MG tablet Take 1 tablet (12.5 mg total) by mouth 2 (two) times daily with a meal. (Patient taking differently: Take 25 mg by mouth 2 (two) times daily with a meal.)   cholecalciferol (VITAMIN D) 1000 UNITS tablet Take 2,000 Units by mouth daily.   furosemide (LASIX) 40 MG tablet Take 1 tablet  1 or 2 x /day  as needed for  Fluid Retention / Ankle Swelling   levothyroxine (SYNTHROID) 100  MCG tablet TAKE 1 TABLET DAILY ON EMPTY STOMACH WITH WATER FOR 30 MINS& NO ANTACID MEDS, CALCIUM, OR MAGNESIUM FOR 4 HRS& AVOID BIOTIN   loratadine (CLARITIN) 10 MG tablet Take 10 mg by mouth daily.   magnesium gluconate (MAGONATE) 500 MG tablet Take 1,000 mg by mouth daily.    metFORMIN (GLUCOPHAGE-XR) 500 MG 24 hr tablet TAKE 2 TABLETS BY MOUTH TWICE DAILY WITH MEALS FOR DIABETES   olmesartan (BENICAR) 40 MG tablet TAKE 1 TABLET BY MOUTH DAILY FOR BLOOD PRESSURE   Omega-3 Fatty Acids (FISH OIL) 1200 MG CAPS Take 1,200 mg by mouth daily.   pantoprazole (PROTONIX) 40 MG tablet TAKE 1  TABLET EVERY DAY BEFORE BREAKFAST   warfarin (COUMADIN) 5 MG tablet Take 1 tablet by mouth on Thursdays and Sundays and 1.5 tablets all other days.   zinc gluconate 50 MG tablet Take 50 mg by mouth daily.   No current facility-administered medications on file prior to visit.    ROS: all negative except above.   Physical Exam:  BP 140/72    Pulse 60    Temp (!) 97.5 F (36.4 C)    Wt 258 lb 12.8 oz (117.4 kg)    SpO2 94%    BMI 35.10 kg/m   General Appearance: Well nourished, in no apparent distress. Eyes: PERRLA, EOMs, conjunctiva no swelling or erythema Sinuses: No Frontal/maxillary tenderness ENT/Mouth: Ext aud canals clear, TMs without erythema, bulging. No erythema, swelling, or exudate on post pharynx.  Tonsils not swollen or erythematous. Hearing normal.  Neck: Supple, thyroid normal.  Respiratory: Respiratory effort normal, BS equal bilaterally without rales, rhonchi, wheezing or stridor.  Cardio: RRR with no MRGs. Brisk peripheral pulses without edema.  Abdomen: Soft, + BS.  Non tender, no guarding, rebound, hernias, masses. Lymphatics: Non tender without lymphadenopathy.  Musculoskeletal: Full ROM, 5/5 strength, normal gait.  Skin: Warm, dry without rashes, lesions, ecchymosis.  Neuro: Cranial nerves intact. Normal muscle tone, no cerebellar symptoms. Sensation intact.  Psych: Awake and oriented  X 3, normal affect, Insight and Judgment appropriate.     Magda Bernheim, NP 11:50 AM [facility]

## 2021-05-28 ENCOUNTER — Ambulatory Visit (INDEPENDENT_AMBULATORY_CARE_PROVIDER_SITE_OTHER): Payer: Medicare Other | Admitting: Nurse Practitioner

## 2021-05-28 ENCOUNTER — Encounter: Payer: Self-pay | Admitting: Nurse Practitioner

## 2021-05-28 ENCOUNTER — Other Ambulatory Visit: Payer: Self-pay

## 2021-05-28 VITALS — BP 140/72 | HR 60 | Temp 97.5°F | Wt 258.8 lb

## 2021-05-28 DIAGNOSIS — Z7901 Long term (current) use of anticoagulants: Secondary | ICD-10-CM | POA: Diagnosis not present

## 2021-05-28 DIAGNOSIS — Z79899 Other long term (current) drug therapy: Secondary | ICD-10-CM

## 2021-05-28 DIAGNOSIS — E1169 Type 2 diabetes mellitus with other specified complication: Secondary | ICD-10-CM

## 2021-05-28 DIAGNOSIS — I10 Essential (primary) hypertension: Secondary | ICD-10-CM

## 2021-05-28 DIAGNOSIS — I480 Paroxysmal atrial fibrillation: Secondary | ICD-10-CM

## 2021-05-28 DIAGNOSIS — E785 Hyperlipidemia, unspecified: Secondary | ICD-10-CM

## 2021-05-28 DIAGNOSIS — I700 Atherosclerosis of aorta: Secondary | ICD-10-CM | POA: Diagnosis not present

## 2021-05-28 MED ORDER — CARVEDILOL 12.5 MG PO TABS: 25.0000 mg | ORAL_TABLET | Freq: Two times a day (BID) | ORAL | 1 refills | Status: DC

## 2021-05-29 LAB — PROTIME-INR
INR: 1.9 — ABNORMAL HIGH
Prothrombin Time: 18.6 s — ABNORMAL HIGH (ref 9.0–11.5)

## 2021-06-12 ENCOUNTER — Other Ambulatory Visit: Payer: Self-pay

## 2021-06-12 ENCOUNTER — Ambulatory Visit (INDEPENDENT_AMBULATORY_CARE_PROVIDER_SITE_OTHER): Payer: Medicare Other

## 2021-06-12 VITALS — BP 128/78 | HR 66 | Temp 97.9°F | Resp 16 | Ht 72.0 in | Wt 255.4 lb

## 2021-06-12 DIAGNOSIS — I480 Paroxysmal atrial fibrillation: Secondary | ICD-10-CM | POA: Diagnosis not present

## 2021-06-12 NOTE — Progress Notes (Signed)
The patient came in for recheck of blood pressure and repeat PT/INR for surgical clearance with his dentist. The patient's BP was 128/78 and the lab will draw his PT/INR.

## 2021-06-13 LAB — PROTIME-INR
INR: 2.8 — ABNORMAL HIGH
Prothrombin Time: 26.4 s — ABNORMAL HIGH (ref 9.0–11.5)

## 2021-06-28 ENCOUNTER — Other Ambulatory Visit: Payer: Self-pay | Admitting: Internal Medicine

## 2021-06-28 ENCOUNTER — Other Ambulatory Visit: Payer: Self-pay | Admitting: Nurse Practitioner

## 2021-06-28 DIAGNOSIS — E1122 Type 2 diabetes mellitus with diabetic chronic kidney disease: Secondary | ICD-10-CM

## 2021-06-28 DIAGNOSIS — E039 Hypothyroidism, unspecified: Secondary | ICD-10-CM

## 2021-06-28 DIAGNOSIS — M1A9XX0 Chronic gout, unspecified, without tophus (tophi): Secondary | ICD-10-CM

## 2021-07-01 ENCOUNTER — Ambulatory Visit: Payer: Medicare Other | Admitting: Cardiology

## 2021-07-03 ENCOUNTER — Other Ambulatory Visit: Payer: Self-pay

## 2021-07-03 ENCOUNTER — Ambulatory Visit: Payer: Medicare Other | Admitting: *Deleted

## 2021-07-03 DIAGNOSIS — Z5181 Encounter for therapeutic drug level monitoring: Secondary | ICD-10-CM | POA: Diagnosis not present

## 2021-07-03 DIAGNOSIS — I4819 Other persistent atrial fibrillation: Secondary | ICD-10-CM | POA: Diagnosis not present

## 2021-07-03 LAB — POCT INR: INR: 4.2 — AB (ref 2.0–3.0)

## 2021-07-03 NOTE — Patient Instructions (Signed)
Description   Do not take any Warfarin today and tomorrow take 1 tablet then continue taking Warfarin 1.5 tablets daily except 1 tablet on Sundays and Thursdays. Recheck INR in 3 weeks (normally 6 weeks). Coumadin Clinic: [phone] Main number: [phone] Fax number [phone]

## 2021-07-24 ENCOUNTER — Ambulatory Visit: Payer: Medicare Other

## 2021-07-24 ENCOUNTER — Other Ambulatory Visit: Payer: Self-pay

## 2021-07-24 DIAGNOSIS — I4819 Other persistent atrial fibrillation: Secondary | ICD-10-CM

## 2021-07-24 DIAGNOSIS — Z5181 Encounter for therapeutic drug level monitoring: Secondary | ICD-10-CM | POA: Diagnosis not present

## 2021-07-24 LAB — POCT INR: INR: 3.3 — AB (ref 2.0–3.0)

## 2021-07-24 NOTE — Patient Instructions (Signed)
Description   ?Only take 0.5 tablet today and then START taking Warfarin 1.5 tablets daily except 1 tablet on Sundays, Tuesdays and Thursdays. Recheck INR in 2 weeks (normally 6 weeks).  ?Coumadin Clinic: [phone] Main number: [phone] Fax number [phone] ?  ?   ?

## 2021-07-28 ENCOUNTER — Other Ambulatory Visit: Payer: Self-pay | Admitting: Cardiology

## 2021-08-05 ENCOUNTER — Ambulatory Visit: Payer: Medicare Other | Admitting: Adult Health Nurse Practitioner

## 2021-08-08 ENCOUNTER — Other Ambulatory Visit: Payer: Self-pay

## 2021-08-08 ENCOUNTER — Ambulatory Visit (INDEPENDENT_AMBULATORY_CARE_PROVIDER_SITE_OTHER): Payer: Medicare Other

## 2021-08-08 ENCOUNTER — Encounter: Payer: Self-pay | Admitting: Cardiology

## 2021-08-08 ENCOUNTER — Ambulatory Visit: Payer: Medicare Other | Admitting: Cardiology

## 2021-08-08 VITALS — BP 136/82 | HR 70 | Ht 72.0 in | Wt 251.6 lb

## 2021-08-08 DIAGNOSIS — I4819 Other persistent atrial fibrillation: Secondary | ICD-10-CM | POA: Diagnosis not present

## 2021-08-08 DIAGNOSIS — Z5181 Encounter for therapeutic drug level monitoring: Secondary | ICD-10-CM | POA: Diagnosis not present

## 2021-08-08 DIAGNOSIS — Z7901 Long term (current) use of anticoagulants: Secondary | ICD-10-CM

## 2021-08-08 DIAGNOSIS — I700 Atherosclerosis of aorta: Secondary | ICD-10-CM

## 2021-08-08 LAB — POCT INR: INR: 2.5 (ref 2.0–3.0)

## 2021-08-08 NOTE — Patient Instructions (Signed)
-   continue taking Warfarin 1.5 tablets daily except 1 tablet on Sundays, Tuesdays and Thursdays. Recheck INR in 6 weeks ?Coumadin Clinic: [phone] Main number: [phone] Fax number [phone] ?

## 2021-08-08 NOTE — Progress Notes (Signed)
? ?Electrophysiology Office Note ? ? ?Date:  08/08/2021  ? ?ID:  Christopher Wade, DOB July 05, 1950, MRN [id number] ? ?PCP:  [name], MD  ?Cardiologist:  Radford Pax ?Primary Electrophysiologist:  Makilah Dowda [name], MD   ? ?No chief complaint on file. ? ?  ?History of Present Illness: ?Christopher Wade is a 71 y.o. male who presents today for electrophysiology evaluation.    ? ?He has a history significant for atrial fibrillation status post cardioversion November 2016.  Unfortunately quickly went back into atrial fibrillation.  He also has a history of hypertension, diabetes, hyperlipidemia, coronary artery disease status post RCA and circumflex stents.  He was initially on amiodarone but he went back into atrial fibrillation.  He is status post ablation 06/11/2016. ? ?Today, denies symptoms of palpitations, chest pain, orthopnea, PND, lower extremity edema, claudication, dizziness, presyncope, syncope, bleeding, or neurologic sequela. The patient is tolerating medications without difficulties.  He has been doing well.  He has noted no further episodes of atrial fibrillation.  He is overall happy with his control.  Unfortunately, he has been getting somewhat short of breath with exertion.  Fairly well at rest.  He would like to do to more daily activities, but he is limited by his shortness of breath. ? ?Past Medical History:  ?Diagnosis Date  ? AKI (acute kidney injury) ([place]) 05/27/2020  ? Arthritis   ? "joints get stiff" (06/11/2016)  ? Bee sting allergy   ? Bee sting allergy   ? CAD (coronary artery disease), native coronary artery 02/12/2016  ? cath showing 50% LM, 40% distal LAD, 70% ost to prox LCs, 40% mid LAD and 95% mid to dist RCA s/p PCI with DES x 2 and stage PCI of the LCx/OM.  ? Carotid artery stenosis   ? 1-39% bilateral by dopplers 08/2017  ? History of gout   ? Hx of adenomatous colonic polyps 04/10/2008  ? Hyperlipidemia   ? Hypertension   ? Hypothyroidism   ? OSA (obstructive sleep apnea)   ? "tried  CPAP; couldn't do it" (06/11/2016)  ? Persistent atrial fibrillation ([place])   ? s/p DCCV 03/2015 with reversion back to atrial fibrillation, loaded with Amio and s/p DCCV to NSR 11/2015.  He is now s/p afib albation.  He had amio lung toxicity and amio was stopped.  ? Thyroid disease   ? Type II diabetes mellitus ([place])   ? Vitamin D deficiency   ? ?Past Surgical History:  ?Procedure Laterality Date  ? BIOPSY  05/28/2020  ? Procedure: BIOPSY;  Surgeon: Jerene Bears, MD;  Location: [place];  Service: Gastroenterology;;  ? CARDIAC CATHETERIZATION N/A 10/11/2015  ? Procedure: Left Heart Cath and Coronary Angiography;  Surgeon: [name], MD;  Location: [place] [facility];  Service: Cardiovascular;  Laterality: N/A;  ? CARDIAC CATHETERIZATION N/A 10/11/2015  ? Procedure: Coronary Stent Intervention;  Surgeon: [name], MD;  Location: [place] [facility];  Service: Cardiovascular;  Laterality: N/A;  ? CARDIAC CATHETERIZATION N/A 10/11/2015  ? Procedure: Intravascular Pressure Wire/FFR Study;  Surgeon: [name], MD;  Location: [place] [facility];  Service: Cardiovascular;  Laterality: N/A;  ? CARDIAC CATHETERIZATION N/A 10/14/2015  ? Procedure: Coronary Stent Intervention;  Surgeon: [name], MD;  Location: [place] [facility];  Service: Cardiovascular;  Laterality: N/A;  ? CARDIOVERSION N/A 04/05/2015  ? Procedure: CARDIOVERSION;  Surgeon: [name], MD;  Location: [place];  Service: Cardiovascular;  Laterality: N/A;  ?  CARDIOVERSION N/A 12/09/2015  ? Procedure: CARDIOVERSION;  Surgeon: [name], MD;  Location: [facility] ENDOSCOPY;  Service: Cardiovascular;  Laterality: N/A;  ? CARDIOVERSION N/A 03/16/2016  ? Procedure: CARDIOVERSION;  Surgeon: [name], MD;  Location: [place];  Service: Cardiovascular;  Laterality: N/A;  ? CATARACT EXTRACTION Right 2019  ? Dr. Patrici Ranks  ? CORONARY ANGIOPLASTY    ? ELECTROPHYSIOLOGIC STUDY N/A 06/11/2016  ? Procedure: Atrial Fibrillation Ablation;   Surgeon: Gissele Narducci [name], MD;  Location: [place] [facility];  Service: Cardiovascular;  Laterality: N/A;  ? ESOPHAGOGASTRODUODENOSCOPY (EGD) WITH PROPOFOL N/A 05/28/2020  ? Procedure: ESOPHAGOGASTRODUODENOSCOPY (EGD) WITH PROPOFOL;  Surgeon: Jerene Bears, MD;  Location: [facility] ENDOSCOPY;  Service: Gastroenterology;  Laterality: N/A;  ? TOTAL HIP ARTHROPLASTY Bilateral 2001  ? UVULECTOMY    ? ? ? ?Current Outpatient Medications  ?Medication Sig Dispense Refill  ? acetaminophen (TYLENOL) 325 MG tablet Take 325 mg by mouth as needed for mild pain.    ? allopurinol (ZYLOPRIM) 300 MG tablet TAKE 1 TABLET BY MOUTH DAILY FOR GOUT PREVENTION 90 tablet 1  ? amLODipine (NORVASC) 10 MG tablet Take 1 tab  Daily For BP & Heart 90 tablet 3  ? Ascorbic Acid (VITAMIN C WITH ROSE HIPS) 500 MG tablet Take 500 mg by mouth daily.    ? atorvastatin (LIPITOR) 80 MG tablet Take 80 mg by mouth daily.    ? B Complex Vitamins (B COMPLEX PO) Take 1 tablet by mouth daily.    ? carvedilol (COREG) 12.5 MG tablet Take 2 tablets (25 mg total) by mouth 2 (two) times daily with a meal. 360 tablet 1  ? cholecalciferol (VITAMIN D) 1000 UNITS tablet Take 2,000 Units by mouth daily.    ? furosemide (LASIX) 40 MG tablet Take 1 tablet  1 or 2 x /day  as needed for  Fluid Retention / Ankle Swelling 180 tablet 1  ? levothyroxine (SYNTHROID) 100 MCG tablet TAKE 1 TABLET DAILY ON EMPTY STOMACH WITH WATER FOR 30 MINS& NO ANTACID MEDS, CALCIUM, OR MAGNESIUM FOR 4 HOURS. AVOID BIOTIN. 90 tablet 1  ? loratadine (CLARITIN) 10 MG tablet Take 10 mg by mouth daily.    ? magnesium gluconate (MAGONATE) 500 MG tablet Take 1,000 mg by mouth daily.     ? metFORMIN (GLUCOPHAGE-XR) 500 MG 24 hr tablet TAKE 2 TABLETS BY MOUTH TWICE DAILY WITH MEALS FOR DIABETES 360 tablet 1  ? olmesartan (BENICAR) 40 MG tablet TAKE 1 TABLET BY MOUTH DAILY FOR BLOOD PRESSURE 90 tablet 3  ? Omega-3 Fatty Acids (FISH OIL) 1200 MG CAPS Take 1,200 mg by mouth daily.    ? pantoprazole (PROTONIX) 40  MG tablet TAKE 1 TABLET EVERY DAY BEFORE BREAKFAST 180 tablet 0  ? warfarin (COUMADIN) 5 MG tablet TAKE 1.5 TABLETS BY MOUTH DAILY EXCEPT 1 TABLET ON SUNDAYS, TUESDAYS, THURSDAYS OR AS DIRECTED BY ANTICOAGULATION CLINIC. 45 tablet 3  ? zinc gluconate 50 MG tablet Take 50 mg by mouth daily.    ? ?No current facility-administered medications for this visit.  ? ? ?Allergies:   Amiodarone and Bee venom  ? ?Social History:  The patient  reports that he has never smoked. He has never used smokeless tobacco. He reports that he does not drink alcohol and does not use drugs.  ? ?Family History:  The patient's family history includes Colon cancer in his sister; Liver disease in his brother, father, mother, and sister; Lung cancer in his father and mother; Prostate cancer in  his brother.  ? ?ROS:  Please see the history of present illness.   Otherwise, review of systems is positive for none.   All other systems are reviewed and negative.  ? ?PHYSICAL EXAM: ?VS:  BP 136/82   Pulse 70   Ht 6' (1.829 m)   Wt 251 lb 9.6 oz (114.1 kg)   SpO2 94%   BMI 34.12 kg/m?  , BMI Body mass index is 34.12 kg/m?. ?GEN: Well nourished, well developed, in no acute distress  ?HEENT: normal  ?Neck: no JVD, carotid bruits, or masses ?Cardiac: RRR; no murmurs, rubs, or gallops,no edema  ?Respiratory:  clear to auscultation bilaterally, normal work of breathing ?GI: soft, nontender, nondistended, + BS ?MS: no deformity or atrophy  ?Skin: warm and dry ?Neuro:  Strength and sensation are intact ?Psych: euthymic mood, full affect ? ?EKG:  EKG is ordered today. ?Personal review of the ekg ordered shows sinus rhythm, rate 67 ? ?Recent Labs: ?05/13/2021: ALT 18; BUN 17; Creat 1.14; Hemoglobin 13.6; Magnesium 1.4; Platelets 310; Potassium 3.8; Sodium 139; TSH 2.70  ? ? ?Lipid Panel  ?   ?Component Value Date/Time  ? CHOL 82 05/13/2021 1521  ? TRIG 146 05/13/2021 1521  ? HDL 30 (L) 05/13/2021 1521  ? CHOLHDL 2.7 05/13/2021 1521  ? VLDL 19 11/30/2016  1609  ? [place] 29 05/13/2021 1521  ? ? ? ?Wt Readings from Last 3 Encounters:  ?06/12/21 255 lb 6.4 oz (115.8 kg)  ?05/28/21 258 lb 12.8 oz (117.4 kg)  ?05/13/21 253 lb 6.4 oz (114.9 kg)  ?  ? ? ?Other studie

## 2021-08-08 NOTE — Patient Instructions (Signed)
Medication Instructions:  ?Your physician recommends that you continue on your current medications as directed. Please refer to the Current Medication list given to you today. ? ?*If you need a refill on your cardiac medications before your next appointment, please call your pharmacy* ? ? ?Lab Work: ?BNP next week ?If you have labs (blood work) drawn today and your tests are completely normal, you will receive your results only by: ?MyChart Message (if you have MyChart) OR ?A paper copy in the mail ?If you have any lab test that is abnormal or we need to change your treatment, we will call you to review the results. ? ? ?Follow-Up: ?At [facility], you and your health needs are our priority.  As part of our continuing mission to provide you with exceptional heart care, we have created designated Provider Care Teams.  These Care Teams include your primary Cardiologist (physician) and Advanced Practice Providers (APPs -  Physician Assistants and Nurse Practitioners) who all work together to provide you with the care you need, when you need it. ? ?We recommend signing up for the patient portal called "MyChart".  Sign up information is provided on this After Visit Summary.  MyChart is used to connect with patients for Virtual Visits (Telemedicine).  Patients are able to view lab/test results, encounter notes, upcoming appointments, etc.  Non-urgent messages can be sent to your provider as well.   ?To learn more about what you can do with MyChart, go to [email or web address].   ? ?Your next appointment:   ?1 year(s) ? ?The format for your next appointment:   ?In Person ? ?Provider:   ?You will see one of the following Advanced Practice Providers on your designated Care Team:   ?Tommye Standard, PA-C ?Legrand Como "Jonni Sanger" [place], PA-C ?  ?

## 2021-08-14 ENCOUNTER — Other Ambulatory Visit: Payer: Self-pay

## 2021-08-14 ENCOUNTER — Other Ambulatory Visit: Payer: Medicare Other

## 2021-08-14 ENCOUNTER — Ambulatory Visit (INDEPENDENT_AMBULATORY_CARE_PROVIDER_SITE_OTHER): Payer: Medicare Other | Admitting: Adult Health

## 2021-08-14 ENCOUNTER — Encounter: Payer: Self-pay | Admitting: Adult Health

## 2021-08-14 VITALS — BP 132/66 | HR 73 | Temp 97.9°F | Wt 258.0 lb

## 2021-08-14 DIAGNOSIS — N401 Enlarged prostate with lower urinary tract symptoms: Secondary | ICD-10-CM

## 2021-08-14 DIAGNOSIS — I4819 Other persistent atrial fibrillation: Secondary | ICD-10-CM | POA: Diagnosis not present

## 2021-08-14 DIAGNOSIS — J849 Interstitial pulmonary disease, unspecified: Secondary | ICD-10-CM | POA: Diagnosis not present

## 2021-08-14 DIAGNOSIS — I10 Essential (primary) hypertension: Secondary | ICD-10-CM | POA: Diagnosis not present

## 2021-08-14 DIAGNOSIS — N138 Other obstructive and reflux uropathy: Secondary | ICD-10-CM

## 2021-08-14 DIAGNOSIS — N182 Chronic kidney disease, stage 2 (mild): Secondary | ICD-10-CM

## 2021-08-14 DIAGNOSIS — E1169 Type 2 diabetes mellitus with other specified complication: Secondary | ICD-10-CM

## 2021-08-14 DIAGNOSIS — I700 Atherosclerosis of aorta: Secondary | ICD-10-CM

## 2021-08-14 DIAGNOSIS — E1122 Type 2 diabetes mellitus with diabetic chronic kidney disease: Secondary | ICD-10-CM

## 2021-08-14 DIAGNOSIS — Z0001 Encounter for general adult medical examination with abnormal findings: Secondary | ICD-10-CM

## 2021-08-14 DIAGNOSIS — D649 Anemia, unspecified: Secondary | ICD-10-CM | POA: Diagnosis not present

## 2021-08-14 DIAGNOSIS — R6889 Other general symptoms and signs: Secondary | ICD-10-CM

## 2021-08-14 DIAGNOSIS — Z0000 Encounter for general adult medical examination without abnormal findings: Secondary | ICD-10-CM

## 2021-08-14 DIAGNOSIS — E785 Hyperlipidemia, unspecified: Secondary | ICD-10-CM | POA: Diagnosis not present

## 2021-08-14 DIAGNOSIS — Z86010 Personal history of colonic polyps: Secondary | ICD-10-CM

## 2021-08-14 DIAGNOSIS — E559 Vitamin D deficiency, unspecified: Secondary | ICD-10-CM

## 2021-08-14 DIAGNOSIS — Z860101 Personal history of adenomatous and serrated colon polyps: Secondary | ICD-10-CM

## 2021-08-14 DIAGNOSIS — M1A9XX0 Chronic gout, unspecified, without tophus (tophi): Secondary | ICD-10-CM

## 2021-08-14 DIAGNOSIS — Z5181 Encounter for therapeutic drug level monitoring: Secondary | ICD-10-CM | POA: Diagnosis not present

## 2021-08-14 DIAGNOSIS — K2270 Barrett's esophagus without dysplasia: Secondary | ICD-10-CM

## 2021-08-14 DIAGNOSIS — E039 Hypothyroidism, unspecified: Secondary | ICD-10-CM

## 2021-08-14 DIAGNOSIS — I2510 Atherosclerotic heart disease of native coronary artery without angina pectoris: Secondary | ICD-10-CM

## 2021-08-14 DIAGNOSIS — G4733 Obstructive sleep apnea (adult) (pediatric): Secondary | ICD-10-CM

## 2021-08-14 DIAGNOSIS — I6523 Occlusion and stenosis of bilateral carotid arteries: Secondary | ICD-10-CM

## 2021-08-14 NOTE — Progress Notes (Signed)
MEDICARE ANNUAL WELLNESS VISIT AND 3 MONTH FOLLOW UP ? ? ?Assessment:  ? ?Keddrick was seen today for medicare wellness. ? ?Diagnoses and all orders for this visit: ? ?Annual Medicare Wellness Visit ?Due annually  ?Health maintenance reviewed ? ?Essential hypertension ?-Continue medications ?DASH diet, exercise and monitor at home. Call if greater than 130/80.  ?-     CBC with Differential/Platelet ?-     COMPLETE METABOLIC PANEL WITH GFR ?-     Magnesium ? ?Hyperlipidemia associated with type 2 diabetes mellitus ([place]) ?Titrate statin for LDL goal <70 ?Discussed dietary and exercise modifications ?Low fat diet ?-     Lipid panel ? ?Persistent atrial fibrillation ([place]) ?Doing well s/p ablation off AAD therapy ?Continue coumadin via cardiology for [facility] of 4 ?Continue following up with cardio/ coumadin clinic  ? ?CKD stage 2 due to type 2 diabetes mellitus ([place]) ?Increase fluids, avoid NSAIDS, monitor sugars, will monitor ?Continue ARB ? ? ?Type 2 diabetes mellitus with stage 2 chronic kidney disease, without long-term current use of insulin ([place]) ?Continue medications: Metformin 552m XL two tablets twice a day with meals.  ?Continue diet and exercise.  ?Perform daily foot/skin check, notify office of any concerning changes.  ?-     Hemoglobin A1c ? ?Chronic gout without tophus, unspecified cause, unspecified site ?Continue Allopurinol 3015mdaily ?No recent flares ?Diet discussed ?Continue medications. ?-     Uric acid ? ?OSA (obstructive sleep apnea) ?Was following neuro, tolerated CPAP poorly, adamantly declined further follow up/referral back. Discussed cardiac/memory risks. Continues to decline for now.  ?Working on weight loss.  ? ?Coronary artery disease involving native coronary artery of native heart without angina pectoris ?Discussed dietary and exercise modifications ?Blood pressure and cholesterol control ?Per CT 01/29/21 ? ?ILD ?Doing well ?Continue to monitor ?No maintenance medication ? ?Morbid obesity  ([place]-  BMI 30+ with OSA ?Discussed dietary and exercise modifications ?Encouraged healthy behaviors walking 3-4 times a week.  Discussed increasing time or distance of walks. ? ?Hypothyroidism, ?Levothyroxine ?Continue medications the same pending lab results ?Reminded to take on an empty stomach 30-6056m before food.  ?-     TSH ? ?Vitamin D deficiency ?Continue supplementation ? ?Medication management ?- Continued ? ?Barrett's esophagus ?Continue PPI daily, follow up EGD due 2025 per GI ? ?History of colon polyps ?UTD; next due 09/2022 ?High fiber diet encouarged ? ?Orders Placed This Encounter  ?Procedures  ? CBC with Differential/Platelet  ? COMPLETE METABOLIC PANEL WITH GFR  ? Magnesium  ? Lipid panel  ? TSH  ? Hemoglobin A1c  ? ? ? ?Over 40 minutes of face to face interview, exam, counseling, chart review, and critical decision making was performed ? ?Future Appointments  ?Date Time Provider DepFishers Landing4/25/2023 11:45 AM [facility]  ?09/16/2021  1:30 PM [facility] None  ?11/19/2021  2:00 PM McKUnk PintoD GA[facility] None  ?08/19/2022  4:00 PM CorLiane ComberP GA[facility] None  ? ? ? ?Plan:  ? ?During the course of the visit the patient was educated and counseled about appropriate screening and preventive services including:  ? ?Pneumococcal vaccine  ?Influenza vaccine ?Prevnar 13 ?Td vaccine ?Screening electrocardiogram ?Colorectal cancer screening ?Diabetes screening ?Glaucoma screening ?Nutrition counseling  ? ? ?Subjective:  ?Christopher Wade a [age]o. male who presents for Medicare Annual Wellness Visit and 3 month follow up. 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 ? ? ?Patient had upper GI bleed and hospitalized 05/26/20. With duodenal ulcer, gastritis and also found distal short segment Barrett's. Pathology showed acute and chronic inflammatory changes, neg H. Pylori, intestinal metaplasia without dysplasia or malignancy. Plans to repeat EGD in 3 years for Barrett's surveillance. ?  ?He has been taking pantoprazole 40 mg now daily decreased from BID to once daily and reports sx remain well controlled.  ? ?OSA recommended CPAP but patient declined, declines referral.  ? ?ILD/ dyspnea improved off of amiodarone, did see Dr. Chase Caller in 2018, was stable/negative labs and no follow up since.  ? ?BMI is Body mass index is 34.99 kg/m?., he has been working on diet and exercise, walks 30 min daily. Down to 244 lb at home, working slowly towards 225 lb.  ?Wt Readings from Last 3 Encounters:  ?08/14/21 258 lb (117 kg)  ?08/08/21 251 lb 9.6 oz (114.1 kg)  ?06/12/21 255 lb 6.4 oz (115.8 kg)  ? ?He has aortic atherosclerosis per CT chest in 2018.  ?Patient has history of Afib (2016) and had CV x 2 (2016 & 2017) and then Jan 2018 had an Afib Ablation (Dr Curt Bears) without noted recurrence, does remain on coumadin managed by coumadin clinic (couldn't afford xarelto) followed by Dr Fransico Him. ? ?His blood pressure has been controlled at home, today their BP is BP: 132/66,  Reports he checks intermittently at home and to goal. ?He does  workout by walking 7 days a week. He denies chest pain, shortness of breath, dizziness.  ? ?He is on cholesterol medication (atorvastatin 80 mg) and denies myalgias. His cholesterol is at goal. The cholesterol last visit was:   ?Lab Results  ?Component Value Date  ? CHOL 82 05/13/2021  ? HDL 30 (L) 05/13/2021  ? [place] 29 05/13/2021  ? TRIG 146 05/13/2021  ? CHOLHDL 2.7 05/13/2021  ? ?He has been working on diet and exercise for diabetes (metformin 1000 mg BID) with CKD II, and denies hyperglycemia, hypoglycemia , increased appetite, nausea, polydipsia, polyuria, visual disturbances and vomiting.  ?Fasting ranges 107-130.  ?Last A1C in the office was:  ?Lab Results  ?Component Value Date  ? HGBA1C 7.6 (H) 05/13/2021  ? ?Last GFR ?Lab Results  ?Component Value Date  ? EGFR 69 05/13/2021  ? ?Taking 100 mcg levothyroxine daily without recent dose change.  ?Lab Results  ?Component Value Date  ? TSH 2.70 05/13/2021  ? ?Patient is on Vitamin D supplement.   ?Lab Results  ?Component Value Date  ? VD25OH 56 05/13/2021  ?   ? ? ?Medication Review: ? ?Current Outpatient Medications (Endocrine & Metabolic):  ?  levothyroxine (SYNTHROID) 100 MCG tablet, TAKE 1 TABLET DAILY ON EMPTY STOMACH WITH WATER FOR 30 MINS& NO ANTACID MEDS, CALCIUM, OR MAGNESIUM FOR 4 HOURS. AVOID BIOTIN. ?  metFORMIN (GLUCOPHAGE-XR) 500 MG 24 hr tablet, TAKE 2 TABLETS  BY MOUTH TWICE DAILY WITH MEALS FOR DIABETES ? ?Current Outpatient Medications (Cardiovascular):  ?  amLODipine (NORVASC) 10 MG tablet, Take 1 tab  Daily For BP & Heart ?  atorvastatin (LIPITOR) 80 MG tablet, Take 80 mg by mouth daily. ?  carvedilol (COREG) 12.5 MG tablet, Take 2 tablets (25 mg total) by mouth 2 (two) times daily with a meal. (Patient taking differently: Take 25 mg by mouth 2 (two) times daily with a meal. Taking 1 tablet twice a day) ?  furosemide (LASIX) 40 MG tablet, Take 1 tablet  1 or 2 x /day  as needed for  Fluid Retention / Ankle Swelling ?  olmesartan (BENICAR) 40  MG tablet, TAKE 1 TABLET BY MOUTH DAILY FOR BLOOD PRESSURE ? ?Current Outpatient Medications (Respiratory):  ?  loratadine (CLARITIN) 10 MG tablet, Take 10 mg by mouth daily. ? ?Current Outpatient [place]

## 2021-08-15 LAB — PRO B NATRIURETIC PEPTIDE: NT-Pro BNP: 238 pg/mL (ref 0–376)

## 2021-08-16 ENCOUNTER — Other Ambulatory Visit: Payer: Self-pay | Admitting: Adult Health

## 2021-08-16 ENCOUNTER — Encounter: Payer: Self-pay | Admitting: Adult Health

## 2021-08-16 DIAGNOSIS — D509 Iron deficiency anemia, unspecified: Secondary | ICD-10-CM

## 2021-08-16 LAB — CBC WITH DIFFERENTIAL/PLATELET
Absolute Monocytes: 731 cells/uL (ref 200–950)
Basophils Absolute: 42 cells/uL (ref 0–200)
Basophils Relative: 0.5 %
Eosinophils Absolute: 118 cells/uL (ref 15–500)
Eosinophils Relative: 1.4 %
HCT: 38.3 % — ABNORMAL LOW (ref 38.5–50.0)
Hemoglobin: 12.8 g/dL — ABNORMAL LOW (ref 13.2–17.1)
Lymphs Abs: 1764 cells/uL (ref 850–3900)
MCH: 30.5 pg (ref 27.0–33.0)
MCHC: 33.4 g/dL (ref 32.0–36.0)
MCV: 91.2 fL (ref 80.0–100.0)
MPV: 10.1 fL (ref 7.5–12.5)
Monocytes Relative: 8.7 %
Neutro Abs: 5746 cells/uL (ref 1500–7800)
Neutrophils Relative %: 68.4 %
Platelets: 286 10*3/uL (ref 140–400)
RBC: 4.2 10*6/uL (ref 4.20–5.80)
RDW: 17.1 % — ABNORMAL HIGH (ref 11.0–15.0)
Total Lymphocyte: 21 %
WBC: 8.4 10*3/uL (ref 3.8–10.8)

## 2021-08-16 LAB — TEST AUTHORIZATION

## 2021-08-16 LAB — COMPLETE METABOLIC PANEL WITH GFR
AG Ratio: 1.2 (calc) (ref 1.0–2.5)
ALT: 18 U/L (ref 9–46)
AST: 18 U/L (ref 10–35)
Albumin: 4.1 g/dL (ref 3.6–5.1)
Alkaline phosphatase (APISO): 94 U/L (ref 35–144)
BUN: 21 mg/dL (ref 7–25)
CO2: 26 mmol/L (ref 20–32)
Calcium: 9.2 mg/dL (ref 8.6–10.3)
Chloride: 102 mmol/L (ref 98–110)
Creat: 1.19 mg/dL (ref 0.70–1.28)
Globulin: 3.3 g/dL (calc) (ref 1.9–3.7)
Glucose, Bld: 175 mg/dL — ABNORMAL HIGH (ref 65–99)
Potassium: 4 mmol/L (ref 3.5–5.3)
Sodium: 139 mmol/L (ref 135–146)
Total Bilirubin: 0.8 mg/dL (ref 0.2–1.2)
Total Protein: 7.4 g/dL (ref 6.1–8.1)
eGFR: 65 mL/min/{1.73_m2} (ref >=60)

## 2021-08-16 LAB — LIPID PANEL
Cholesterol: 86 mg/dL (ref <200)
HDL: 34 mg/dL — ABNORMAL LOW (ref >=40)
LDL Cholesterol (Calc): 27 mg/dL (calc)
Non-HDL Cholesterol (Calc): 52 mg/dL (calc) (ref <130)
Total CHOL/HDL Ratio: 2.5 (calc) (ref <5.0)
Triglycerides: 173 mg/dL — ABNORMAL HIGH (ref <150)

## 2021-08-16 LAB — TSH: TSH: 2.66 mIU/L (ref 0.40–4.50)

## 2021-08-16 LAB — IRON,TIBC AND FERRITIN PANEL
%SAT: 14 % (calc) — ABNORMAL LOW (ref 20–48)
Ferritin: 22 ng/mL — ABNORMAL LOW (ref 24–380)
Iron: 57 ug/dL (ref 50–180)
TIBC: 410 mcg/dL (calc) (ref 250–425)

## 2021-08-16 LAB — MAGNESIUM: Magnesium: 1.5 mg/dL (ref 1.5–2.5)

## 2021-08-16 LAB — HEMOGLOBIN A1C
Hgb A1c MFr Bld: 8.1 % of total Hgb — ABNORMAL HIGH (ref <5.7)
Mean Plasma Glucose: 186 mg/dL
eAG (mmol/L): 10.3 mmol/L

## 2021-08-16 MED ORDER — IRON 325 (65 FE) MG PO TABS
1.0000 | ORAL_TABLET | ORAL | 0 refills | Status: AC
Start: 2021-08-16 — End: ?

## 2021-08-19 ENCOUNTER — Other Ambulatory Visit: Payer: Self-pay

## 2021-08-19 MED ORDER — MAGNESIUM GLUCONATE 500 MG PO TABS
ORAL_TABLET | ORAL | 0 refills | Status: DC
Start: 2021-08-19 — End: 2023-09-13

## 2021-09-01 ENCOUNTER — Other Ambulatory Visit: Payer: Self-pay

## 2021-09-01 DIAGNOSIS — D509 Iron deficiency anemia, unspecified: Secondary | ICD-10-CM

## 2021-09-01 DIAGNOSIS — Z1212 Encounter for screening for malignant neoplasm of rectum: Secondary | ICD-10-CM | POA: Diagnosis not present

## 2021-09-01 DIAGNOSIS — Z1211 Encounter for screening for malignant neoplasm of colon: Secondary | ICD-10-CM | POA: Diagnosis not present

## 2021-09-01 LAB — POC HEMOCCULT BLD/STL (HOME/3-CARD/SCREEN)
Card #2 Fecal Occult Blod, POC: NEGATIVE
Card #3 Fecal Occult Blood, POC: NEGATIVE
Fecal Occult Blood, POC: NEGATIVE

## 2021-09-11 ENCOUNTER — Telehealth: Payer: Self-pay

## 2021-09-11 NOTE — Telephone Encounter (Signed)
LM-09/11/21-Called pt. At [phone] to complete CCM to CCS transfer and cancel upcoming CP visit. Unable to reach patient. Excel x1.  ? ?Total time spent: 2 min. ?

## 2021-09-11 NOTE — Telephone Encounter (Signed)
LM-09/11/21-Pt. returned call & completed program transfer to CCM to CCS and canceled cp visit. Pt. verbalized understanding and agreed. ? ?Total time spent: 5 min. ?

## 2021-09-16 ENCOUNTER — Ambulatory Visit: Payer: Medicare Other | Admitting: Pharmacist

## 2021-09-16 ENCOUNTER — Ambulatory Visit: Payer: Medicare Other | Admitting: Pharmacy Technician

## 2021-09-16 DIAGNOSIS — Z5181 Encounter for therapeutic drug level monitoring: Secondary | ICD-10-CM | POA: Diagnosis not present

## 2021-09-16 DIAGNOSIS — I4819 Other persistent atrial fibrillation: Secondary | ICD-10-CM

## 2021-09-16 LAB — POCT INR: INR: 3.7 — AB (ref 2.0–3.0)

## 2021-09-16 NOTE — Patient Instructions (Signed)
Description   ?Skip your warfarin today,then continue taking 1.5 tablets daily except 1 tablet on Sundays, Tuesdays and Thursdays. Recheck INR in 3 weeks ?Coumadin Clinic: [phone] Main number: [phone] Fax number [phone] ?  ? ? ?

## 2021-09-21 DIAGNOSIS — N182 Chronic kidney disease, stage 2 (mild): Secondary | ICD-10-CM | POA: Diagnosis not present

## 2021-09-21 DIAGNOSIS — E1122 Type 2 diabetes mellitus with diabetic chronic kidney disease: Secondary | ICD-10-CM | POA: Diagnosis not present

## 2021-09-21 DIAGNOSIS — I700 Atherosclerosis of aorta: Secondary | ICD-10-CM | POA: Diagnosis not present

## 2021-09-21 DIAGNOSIS — I10 Essential (primary) hypertension: Secondary | ICD-10-CM | POA: Diagnosis not present

## 2021-09-23 ENCOUNTER — Other Ambulatory Visit: Payer: Self-pay | Admitting: Cardiology

## 2021-10-07 ENCOUNTER — Ambulatory Visit: Payer: Medicare Other | Admitting: *Deleted

## 2021-10-07 DIAGNOSIS — I4819 Other persistent atrial fibrillation: Secondary | ICD-10-CM | POA: Diagnosis not present

## 2021-10-07 DIAGNOSIS — Z5181 Encounter for therapeutic drug level monitoring: Secondary | ICD-10-CM | POA: Diagnosis not present

## 2021-10-07 LAB — POCT INR: INR: 2 (ref 2.0–3.0)

## 2021-10-07 NOTE — Patient Instructions (Signed)
Description   ?Continue taking 1.5 tablets daily except 1 tablet on Sundays, Tuesdays and Thursdays. Recheck INR in 4 weeks. Coumadin Clinic: [phone] Main number: [phone] Fax number [phone] ?  ?  ?

## 2021-10-09 ENCOUNTER — Other Ambulatory Visit: Payer: Self-pay | Admitting: Nurse Practitioner

## 2021-11-04 ENCOUNTER — Ambulatory Visit: Payer: Medicare Other | Admitting: *Deleted

## 2021-11-04 DIAGNOSIS — I4819 Other persistent atrial fibrillation: Secondary | ICD-10-CM | POA: Diagnosis not present

## 2021-11-04 DIAGNOSIS — Z5181 Encounter for therapeutic drug level monitoring: Secondary | ICD-10-CM | POA: Diagnosis not present

## 2021-11-04 LAB — POCT INR: INR: 3.4 — AB (ref 2.0–3.0)

## 2021-11-04 NOTE — Patient Instructions (Signed)
Description   Hold warfarin today then continue taking 1.5 tablets daily except 1 tablet on Sundays, Tuesdays and Thursdays. Recheck INR in 3 weeks. Coumadin Clinic: [phone] Main number: [phone] Fax number [phone]

## 2021-11-06 ENCOUNTER — Encounter: Payer: Medicare Other | Admitting: Internal Medicine

## 2021-11-12 ENCOUNTER — Other Ambulatory Visit: Payer: Self-pay | Admitting: Internal Medicine

## 2021-11-12 DIAGNOSIS — R609 Edema, unspecified: Secondary | ICD-10-CM

## 2021-11-19 ENCOUNTER — Ambulatory Visit (INDEPENDENT_AMBULATORY_CARE_PROVIDER_SITE_OTHER): Payer: Medicare Other | Admitting: Internal Medicine

## 2021-11-19 ENCOUNTER — Encounter: Payer: Self-pay | Admitting: Internal Medicine

## 2021-11-19 VITALS — BP 128/70 | HR 66 | Temp 96.9°F | Resp 16 | Ht 71.0 in | Wt 253.4 lb

## 2021-11-19 DIAGNOSIS — E559 Vitamin D deficiency, unspecified: Secondary | ICD-10-CM

## 2021-11-19 DIAGNOSIS — I10 Essential (primary) hypertension: Secondary | ICD-10-CM

## 2021-11-19 DIAGNOSIS — Z136 Encounter for screening for cardiovascular disorders: Secondary | ICD-10-CM | POA: Diagnosis not present

## 2021-11-19 DIAGNOSIS — Z125 Encounter for screening for malignant neoplasm of prostate: Secondary | ICD-10-CM | POA: Diagnosis not present

## 2021-11-19 DIAGNOSIS — N138 Other obstructive and reflux uropathy: Secondary | ICD-10-CM

## 2021-11-19 DIAGNOSIS — I700 Atherosclerosis of aorta: Secondary | ICD-10-CM

## 2021-11-19 DIAGNOSIS — Z8249 Family history of ischemic heart disease and other diseases of the circulatory system: Secondary | ICD-10-CM

## 2021-11-19 DIAGNOSIS — Z79899 Other long term (current) drug therapy: Secondary | ICD-10-CM

## 2021-11-19 DIAGNOSIS — E1169 Type 2 diabetes mellitus with other specified complication: Secondary | ICD-10-CM | POA: Diagnosis not present

## 2021-11-19 DIAGNOSIS — E1122 Type 2 diabetes mellitus with diabetic chronic kidney disease: Secondary | ICD-10-CM | POA: Diagnosis not present

## 2021-11-19 DIAGNOSIS — Z0000 Encounter for general adult medical examination without abnormal findings: Secondary | ICD-10-CM

## 2021-11-19 DIAGNOSIS — Z1211 Encounter for screening for malignant neoplasm of colon: Secondary | ICD-10-CM

## 2021-11-19 DIAGNOSIS — I480 Paroxysmal atrial fibrillation: Secondary | ICD-10-CM

## 2021-11-19 DIAGNOSIS — M1A9XX0 Chronic gout, unspecified, without tophus (tophi): Secondary | ICD-10-CM

## 2021-11-19 DIAGNOSIS — E039 Hypothyroidism, unspecified: Secondary | ICD-10-CM

## 2021-11-19 MED ORDER — TIRZEPATIDE 2.5 MG/0.5ML ~~LOC~~ SOAJ: SUBCUTANEOUS | 0 refills | Status: DC

## 2021-11-19 NOTE — Patient Instructions (Signed)

## 2021-11-19 NOTE — Progress Notes (Signed)
Annual  Screening/Preventative Visit  & Comprehensive Evaluation & Examination  Future Appointments  Date Time Provider Department  11/19/2021                  CPE  2:00 PM [name], MD [facility]  08/19/2022                  Wellness  4:00 PM Darrol Jump, NP [facility]  11/24/2022                    CPE  3:00 PM [name], MD [facility]              This very nice 71 y.o. single WM presents for a Screening /Preventative Visit & comprehensive evaluation and management of multiple medical co-morbidities.  Patient has been followed for HTN, ASHD/pAfib, HLD, T2_NIDDM  and Vitamin D Deficiency. Patient has OSA , but was mask intolerant. Patient is followed by Dr Carlean Purl for Barrett's Esophagus.       HTN predates circa 2006. Patient's BP has been controlled at home.  Today's BP was initially slightly elevated and rechecked at goal - 138/84.  He was dx'd with Afib in 2016 and in  2016 & 2017, he failed CV x 2.   In 2018, he had an Ablation. He was treated with Amiodarione which was stopped when he developed Pulmonary toxicity.  Patient is on Coumadin managed  at [facility] .  Patient denies any cardiac symptoms as chest pain, palpitations, shortness of breath, dizziness or ankle swelling.       Patient's hyperlipidemia is controlled with diet and medications. Patient denies myalgias or other medication SE's. Last lipids were at goal except sl elevated Trig's:  Lab Results  Component Value Date   CHOL 86 08/14/2021   HDL 34 (L) 08/14/2021   LDLCALC 27 08/14/2021   TRIG 173 (H) 08/14/2021   CHOLHDL 2.5 08/14/2021         Patient has Morbid Obesity  (BMI 35+) and consequent T2_NIDDM (2012) w/CKD2  (GFR 74) and patient denies reactive hypoglycemic symptoms, visual blurring, diabetic polys or paresthesias. Last A1c was not at goal:   Lab Results  Component Value Date   HGBA1C 8.1 (H) 08/14/2021        Patient has been on  Thyroid Replacement since 2011.  Finally,  patient has history of Vitamin D Deficiency ("23" / 2009) and last vitamin D was near goal  (70-100):   Lab Results  Component Value Date   VD25OH 56 05/13/2021                    Current Outpatient Medications on File Prior to Visit  Medication Sig   acetaminophen 325 MG tablet Take 650 mg  every 6 hours as needed for mild pain.   allopurinol 300 MG tablet TAKE 1 TABLET DAILY    amLODipine 10 MG tablet Take 1 tab Daily For BP & Heart   VITAMIN C  500 MG tablet Take daily.   atorvastatin 80 MG tablet Take 40 mg daily.   B Complex Vitamins  Take 1 tablet daily.   carvedilol (COREG) 12.5 MG tablet TAKE 2 TABLETS  TWICE DAILY    VITAMIN D  1000 UNITS tablet Take 2,000 Units by mouth daily.   colchicine 0.6 MG tablet Take 1 tablet daily to prevent Gout Attacks   Levothyroxine  100 MCG tablet TAKE 1 TABLET DAILY    loratadine  10 MG  tablet Take 10 mg by mouth daily.   magnesium  500 MG tablet Take 1,000 mg by mouth daily.    metFORMIN-XR 500 MG 24 hr tablet Take  2 tablets  2 x /day with Meals     Olmesartan  40 MG tablet Take 1 tablet Daily for BP   Omega-3 FISH OIL 1200 MG CAPS Take 1,200 mg by mouth daily.   pantoprazole  40 MG tablet Take 1 tablet  daily before breakfast.   COUMADIN 5 MG tablet Take 1 tablet on Thurs & Sun  and 1.5 tablets all other days.   zinc 50 MG tablet Take  daily.    Allergies  Allergen Reactions   Amiodarone Other (See Comments)    Lung toxicity   Bee Venom Other (See Comments)    Leg went numb    Past Medical History:  Diagnosis Date   AKI (acute kidney injury) ([place]) 05/27/2020   Arthritis    "joints get stiff" (06/11/2016)   Bee sting allergy    Bee sting allergy    CAD (coronary artery disease), native coronary artery 02/12/2016   cath showing 50% LM, 40% distal LAD, 70% ost to prox LCs, 40% mid LAD and 95% mid to dist RCA s/p PCI with DES x 2 and stage PCI of the LCx/OM.   Carotid artery stenosis    1-39% bilateral by dopplers 08/2017    History of gout    Hx of adenomatous colonic polyps 04/10/2008   Hyperlipidemia    Hypertension    Hypothyroidism    OSA (obstructive sleep apnea)    "tried CPAP; couldn't do it" (06/11/2016)   Persistent atrial fibrillation ([place])    s/p DCCV 03/2015 with reversion back to atrial fibrillation, loaded with Amio and s/p DCCV to NSR 11/2015.  He is now s/p afib albation.  He had amiodarone  lung toxicity and it was stopped.   Thyroid disease    Type II diabetes mellitus ([place])    Vitamin D deficiency      Health Maintenance  Topic Date Due   Zoster Vaccines- Shingrix (1 of 2) Never done   COVID-19 Vaccine (4 - Booster for Pfizer series) 10/06/2020   FOOT EXAM  10/31/2020   INFLUENZA VACCINE  12/23/2020   OPHTHALMOLOGY EXAM  01/03/2021   HEMOGLOBIN A1C  02/05/2021   COLONOSCOPY  10/06/2022   TETANUS/TDAP  05/07/2030   Hepatitis C Screening  Completed   PNA vac Low Risk Adult  Completed   HPV VACCINES  Aged Out     Immunization History  Administered Date(s) Administered   Influenza Split 04/05/2014   Influenza, High Dose  03/31/2018, 03/20/2019, 05/07/2020   Influenza 02/02/2013, 05/21/2016   PFIZER SARS-COV-2 Vacc 12/13/2019, 01/03/2020, 07/09/2020   Pneumococcal-13 05/01/2014   Pneumococcal -23 05/16/2015   Td 04/25/2010, 05/07/2020   Varicella Zoster Immune Globulin 02/22/2014    Last Colon - 10/05/2017 - Dr Carlean Purl recc 5 yr f/u due May 2024   Past Surgical History:  Procedure Laterality Date   BIOPSY  05/28/2020   Procedure: BIOPSY;  Surgeon: Jerene Bears, MD;  Location: [place];  Service: Gastroenterology;;   CARDIAC CATHETERIZATION N/A 10/11/2015   Procedure: Left Heart Cath and Coronary Angiography;  Surgeon: [name], MD;  Location: [place] [facility];  Service: Cardiovascular;  Laterality: N/A;   CARDIAC CATHETERIZATION N/A 10/11/2015   Procedure: Coronary Stent Intervention;  Surgeon: [name], MD;  Location: [place] [facility];  Service:  Cardiovascular;  Laterality: N/A;   CARDIAC CATHETERIZATION N/A 10/11/2015   Procedure: Intravascular Pressure Wire/FFR Study;  Surgeon: [name], MD;  Location: [place] [facility];  Service: Cardiovascular;  Laterality: N/A;   CARDIAC CATHETERIZATION N/A 10/14/2015   Procedure: Coronary Stent Intervention;  Surgeon: [name], MD;  Location: [place] [facility];  Service: Cardiovascular;  Laterality: N/A;   CARDIOVERSION N/A 04/05/2015   Procedure: CARDIOVERSION;  Surgeon: [name], MD;  Location: [place];  Service: Cardiovascular;  Laterality: N/A;   CARDIOVERSION N/A 12/09/2015   Procedure: CARDIOVERSION;  Surgeon: [name], MD;  Location: [place] [facility];  Service: Cardiovascular;  Laterality: N/A;   CARDIOVERSION N/A 03/16/2016   Procedure: CARDIOVERSION;  Surgeon: [name], MD;  Location: [facility];  Service: Cardiovascular;  Laterality: N/A;   CATARACT EXTRACTION Right 2019   Dr. Patrici Ranks   CORONARY ANGIOPLASTY     ELECTROPHYSIOLOGIC STUDY N/A 06/11/2016   Procedure: Atrial Fibrillation Ablation;  Surgeon: Will [name], MD;  Location: [place] [facility];  Service: Cardiovascular;  Laterality: N/A;   ESOPHAGOGASTRODUODENOSCOPY (EGD) WITH PROPOFOL N/A 05/28/2020   Procedure: ESOPHAGOGASTRODUODENOSCOPY (EGD) WITH PROPOFOL;  Surgeon: Jerene Bears, MD;  Location: [facility];  Service: Gastroenterology;  Laterality: N/A;   TOTAL HIP ARTHROPLASTY Bilateral 2001   UVULECTOMY       Family History  Problem Relation Age of Onset   Lung cancer Mother    Liver disease Mother    Lung cancer Father    Liver disease Father    Colon cancer Sister    Liver disease Sister    Prostate cancer Brother    Liver disease Brother    Stomach cancer Neg Hx    Pancreatic cancer Neg Hx    Esophageal cancer Neg Hx     Social History   Socioeconomic History   Marital status: Single    Spouse name: Not on file   Number of children: Not on file  Occupational  History     Tobacco Use   Smoking status: Never   Smokeless tobacco: Never  Vaping Use   Vaping Use: Never used  Substance and Sexual Activity   Alcohol use: No   Drug use: No   Sexual activity: Not Currently  Social History Narrative   He is single, he has been in [employer] for his whole life.     Denies alcohol tobacco or drug use      ROS Constitutional: Denies fever, chills, weight loss/gain, headaches, insomnia,  night sweats or change in appetite. Does c/o fatigue. Eyes: Denies redness, blurred vision, diplopia, discharge, itchy or watery eyes.  ENT: Denies discharge, congestion, post nasal drip, epistaxis, sore throat, earache, hearing loss, dental pain, Tinnitus, Vertigo, Sinus pain or snoring.  Cardio: Denies chest pain, palpitations, irregular heartbeat, syncope, dyspnea, diaphoresis, orthopnea, PND, claudication or edema Respiratory: denies cough, dyspnea, DOE, pleurisy, hoarseness, laryngitis or wheezing.  Gastrointestinal: Denies dysphagia, heartburn, reflux, water brash, pain, cramps, nausea, vomiting, bloating, diarrhea, constipation, hematemesis, melena, hematochezia, jaundice or hemorrhoids Genitourinary: Denies dysuria, frequency, urgency, nocturia, hesitancy, discharge, hematuria or flank pain Musculoskeletal: Denies arthralgia, myalgia, stiffness, Jt. Swelling, pain, limp or strain/sprain. Denies Falls. Skin: Denies puritis, rash, hives, warts, acne, eczema or change in skin lesion Neuro: No weakness, tremor, incoordination, spasms, paresthesia or pain Psychiatric: Denies confusion, memory loss or sensory loss. Denies Depression. Endocrine: Denies change in weight, skin, hair change, nocturia, and paresthesia, diabetic polys, visual blurring or hyper / hypo glycemic episodes.  Heme/Lymph: No  excessive bleeding, bruising or enlarged lymph nodes.   Physical Exam  BP 128/70   Pulse 66   Temp (!) 96.9 F (36.1 C)   Resp 16   Ht '5\' 11"'$  (1.803 m)   Wt 253 lb  6.4 oz (114.9 kg)   SpO2 99%   BMI 35.34 kg/m   General Appearance: Well nourished and well groomed and in no apparent distress.  Eyes: PERRLA, EOMs, conjunctiva no swelling or erythema, normal fundi and vessels. Sinuses: No frontal/maxillary tenderness ENT/Mouth: EACs patent / TMs  nl. Nares clear without erythema, swelling, mucoid exudates. Oral hygiene is good. No erythema, swelling, or exudate. Tongue normal, non-obstructing. Tonsils not swollen or erythematous. Hearing normal.  Neck: Supple, thyroid not palpable. No bruits, nodes or JVD. Respiratory: Respiratory effort normal.  BS equal and clear bilateral without rales, rhonci, wheezing or stridor. Cardio: Heart sounds are normal with regular rate and rhythm and no murmurs, rubs or gallops. Peripheral pulses are normal and equal bilaterally without edema. No aortic or femoral bruits. Chest: symmetric with normal excursions and percussion.  Abdomen: Soft, with Nl bowel sounds. Nontender, no guarding, rebound, hernias, masses, or organomegaly.  Lymphatics: Non tender without lymphadenopathy.  Musculoskeletal: Full ROM all peripheral extremities, joint stability, 5/5 strength, and normal gait. Skin: Warm and dry without rashes, lesions, cyanosis, clubbing or  ecchymosis.  Neuro: Cranial nerves intact, reflexes equal bilaterally. Normal muscle tone, no cerebellar symptoms. Sensation intact.  Pysch: Alert and oriented X 3 with normal affect, insight and judgment appropriate.   Assessment and Plan  1. Annual Preventative/Screening Exam    2. Essential hypertension  - Microalbumin / creatinine urine ratio - POC Hemoccult Bld/Stl (3-Cd Home Screen); Future - CBC with Differential/Platelet - COMPLETE METABOLIC PANEL WITH GFR - Magnesium - TSH - EKG 12-Lead - [place], RETROPERITNL ABD,  LTD  3. Hyperlipidemia associated with type 2 diabetes mellitus ([place])  - Lipid panel - TSH - EKG 12-Lead - [place], RETROPERITNL ABD,  LTD  4. Type 2  diabetes mellitus with stage 2 chronic kidney disease, without long-term current use of insulin (HCC)  - New start Mounjaro 2.5 Mg weekly  - HM DIABETES FOOT EXAM - LOW EXTREMITY NEUR EXAM DOCUM - Hemoglobin A1c - Insulin, random - EKG 12-Lead - [place], RETROPERITNL ABD,  LTD  5. Vitamin D deficiency  - VITAMIN D 25 Hydroxy   6. Hypothyroidism, unspecified type  - TSH  7. Paroxysmal atrial fibrillation (HCC)  - TSH - EKG 12-Lead  8. Aortic atherosclerosis ([place]) by Chest CT scan in 2018  - EKG 12-Lead - [place], RETROPERITNL ABD,  LTD  9. Coronary artery disease involving native coronary  artery of native heart without angina pectoris  - EKG 12-Lead  10. Chronic gout   - Uric acid  11. Barrett's esophagus without dysplasia   12. OSA (obstructive sleep apnea)   13. BPH with obstruction/lower urinary tract symptoms  - PSA  14. Screening for colorectal cancer  - POC Hemoccult Bld/Stl  15. Prostate cancer screening  - PSA  16. Screening for ischemic heart disease  - EKG 12-Lead  17. FH: hypertension  - EKG 12-Lead - [place], RETROPERITNL ABD,  LTD  18. Screening for AAA (aortic abdominal aneurysm)  - [place], RETROPERITNL ABD,  LTD  19. Medication management  - Uric acid - CBC with Differential/Platelet - COMPLETE METABOLIC PANEL WITH GFR - Magnesium - Lipid panel - TSH - Hemoglobin A1c - Insulin, random - VITAMIN D 25 Hydroxy  Patient was counseled in prudent diet, weight control to achieve/maintain BMI less than 25, BP monitoring, regular exercise and medications as discussed.  Discussed med effects and SE's. Routine screening labs and tests as requested with regular follow-up as recommended. Over 40 minutes of exam, counseling, chart review and high complex critical decision making was performed   Kirtland Bouchard, MD

## 2021-11-20 LAB — URINALYSIS, ROUTINE W REFLEX MICROSCOPIC
Bacteria, UA: NONE SEEN /HPF
Bilirubin Urine: NEGATIVE
Glucose, UA: NEGATIVE
Hgb urine dipstick: NEGATIVE
Hyaline Cast: NONE SEEN /LPF
Ketones, ur: NEGATIVE
Leukocytes,Ua: NEGATIVE
Nitrite: NEGATIVE
RBC / HPF: NONE SEEN /HPF (ref 0–2)
Specific Gravity, Urine: 1.018 (ref 1.001–1.035)
WBC, UA: NONE SEEN /HPF (ref 0–5)
pH: 6.5 (ref 5.0–8.0)

## 2021-11-20 LAB — MICROALBUMIN / CREATININE URINE RATIO
Creatinine, Urine: 162 mg/dL (ref 20–320)
Microalb Creat Ratio: 9 mcg/mg creat (ref <30)
Microalb, Ur: 1.4 mg/dL

## 2021-11-20 LAB — PSA: PSA: 0.53 ng/mL (ref <=4.00)

## 2021-11-20 LAB — TSH: TSH: 4.5 mIU/L (ref 0.40–4.50)

## 2021-11-20 LAB — HEMOGLOBIN A1C
Hgb A1c MFr Bld: 7.5 % of total Hgb — ABNORMAL HIGH (ref <5.7)
Mean Plasma Glucose: 169 mg/dL
eAG (mmol/L): 9.3 mmol/L

## 2021-11-20 LAB — COMPLETE METABOLIC PANEL WITH GFR
AG Ratio: 1.3 (calc) (ref 1.0–2.5)
ALT: 30 U/L (ref 9–46)
AST: 30 U/L (ref 10–35)
Albumin: 4.4 g/dL (ref 3.6–5.1)
Alkaline phosphatase (APISO): 104 U/L (ref 35–144)
BUN/Creatinine Ratio: 17 (calc) (ref 6–22)
BUN: 26 mg/dL — ABNORMAL HIGH (ref 7–25)
CO2: 29 mmol/L (ref 20–32)
Calcium: 9.8 mg/dL (ref 8.6–10.3)
Chloride: 99 mmol/L (ref 98–110)
Creat: 1.52 mg/dL — ABNORMAL HIGH (ref 0.70–1.28)
Globulin: 3.4 g/dL (calc) (ref 1.9–3.7)
Glucose, Bld: 148 mg/dL — ABNORMAL HIGH (ref 65–99)
Potassium: 4 mmol/L (ref 3.5–5.3)
Sodium: 141 mmol/L (ref 135–146)
Total Bilirubin: 1 mg/dL (ref 0.2–1.2)
Total Protein: 7.8 g/dL (ref 6.1–8.1)
eGFR: 49 mL/min/{1.73_m2} — ABNORMAL LOW (ref >=60)

## 2021-11-20 LAB — CBC WITH DIFFERENTIAL/PLATELET
Absolute Monocytes: 702 cells/uL (ref 200–950)
Basophils Absolute: 36 cells/uL (ref 0–200)
Basophils Relative: 0.4 %
Eosinophils Absolute: 270 cells/uL (ref 15–500)
Eosinophils Relative: 3 %
HCT: 45.1 % (ref 38.5–50.0)
Hemoglobin: 15.2 g/dL (ref 13.2–17.1)
Lymphs Abs: 1557 cells/uL (ref 850–3900)
MCH: 32.6 pg (ref 27.0–33.0)
MCHC: 33.7 g/dL (ref 32.0–36.0)
MCV: 96.8 fL (ref 80.0–100.0)
MPV: 10.4 fL (ref 7.5–12.5)
Monocytes Relative: 7.8 %
Neutro Abs: 6435 cells/uL (ref 1500–7800)
Neutrophils Relative %: 71.5 %
Platelets: 267 10*3/uL (ref 140–400)
RBC: 4.66 10*6/uL (ref 4.20–5.80)
RDW: 16.2 % — ABNORMAL HIGH (ref 11.0–15.0)
Total Lymphocyte: 17.3 %
WBC: 9 10*3/uL (ref 3.8–10.8)

## 2021-11-20 LAB — LIPID PANEL
Cholesterol: 96 mg/dL (ref <200)
HDL: 32 mg/dL — ABNORMAL LOW (ref >=40)
LDL Cholesterol (Calc): 38 mg/dL (calc)
Non-HDL Cholesterol (Calc): 64 mg/dL (calc) (ref <130)
Total CHOL/HDL Ratio: 3 (calc) (ref <5.0)
Triglycerides: 184 mg/dL — ABNORMAL HIGH (ref <150)

## 2021-11-20 LAB — URIC ACID: Uric Acid, Serum: 6.2 mg/dL (ref 4.0–8.0)

## 2021-11-20 LAB — MAGNESIUM: Magnesium: 1.8 mg/dL (ref 1.5–2.5)

## 2021-11-20 LAB — INSULIN, RANDOM: Insulin: 10.2 u[IU]/mL

## 2021-11-20 LAB — VITAMIN D 25 HYDROXY (VIT D DEFICIENCY, FRACTURES): Vit D, 25-Hydroxy: 54 ng/mL (ref 30–100)

## 2021-11-26 ENCOUNTER — Ambulatory Visit: Payer: Medicare Other | Admitting: *Deleted

## 2021-11-26 DIAGNOSIS — Z5181 Encounter for therapeutic drug level monitoring: Secondary | ICD-10-CM | POA: Diagnosis not present

## 2021-11-26 DIAGNOSIS — I4819 Other persistent atrial fibrillation: Secondary | ICD-10-CM

## 2021-11-26 LAB — POCT INR: INR: 3.5 — AB (ref 2.0–3.0)

## 2021-11-26 NOTE — Patient Instructions (Signed)
Description   Hold warfarin today then start taking 1 tablet daily except 1 tablet on Monday, Wednesday, and Friday. Recheck INR in 3 weeks. [facility]: [phone] Main number: [phone] Fax number [phone]

## 2021-12-16 ENCOUNTER — Ambulatory Visit (INDEPENDENT_AMBULATORY_CARE_PROVIDER_SITE_OTHER): Payer: Medicare Other

## 2021-12-16 DIAGNOSIS — Z5181 Encounter for therapeutic drug level monitoring: Secondary | ICD-10-CM | POA: Diagnosis not present

## 2021-12-16 DIAGNOSIS — I4819 Other persistent atrial fibrillation: Secondary | ICD-10-CM

## 2021-12-16 LAB — POCT INR: INR: 3.4 — AB (ref 2.0–3.0)

## 2021-12-16 NOTE — Patient Instructions (Signed)
Hold warfarin today then continue 1 tablet daily except 1.5 tablets on Monday, Wednesday, and Friday. Recheck INR in 4 weeks. [facility]: [phone] Main number: [phone] Fax number [phone]

## 2021-12-17 ENCOUNTER — Other Ambulatory Visit: Payer: Self-pay | Admitting: Internal Medicine

## 2021-12-17 DIAGNOSIS — I10 Essential (primary) hypertension: Secondary | ICD-10-CM

## 2021-12-17 MED ORDER — AMLODIPINE BESYLATE 10 MG PO TABS: ORAL_TABLET | ORAL | 3 refills | Status: DC

## 2021-12-18 ENCOUNTER — Other Ambulatory Visit: Payer: Self-pay

## 2021-12-18 DIAGNOSIS — E039 Hypothyroidism, unspecified: Secondary | ICD-10-CM

## 2021-12-18 DIAGNOSIS — E1122 Type 2 diabetes mellitus with diabetic chronic kidney disease: Secondary | ICD-10-CM

## 2021-12-18 MED ORDER — METFORMIN HCL ER 500 MG PO TB24: ORAL_TABLET | ORAL | 1 refills | Status: DC

## 2021-12-18 MED ORDER — LEVOTHYROXINE SODIUM 100 MCG PO TABS: ORAL_TABLET | ORAL | 1 refills | Status: DC

## 2022-01-07 ENCOUNTER — Other Ambulatory Visit: Payer: Self-pay | Admitting: Cardiology

## 2022-01-07 NOTE — Telephone Encounter (Signed)
Refill request for warfarin:  Last INR was 3.4 on 12/16/21 Next INR due on 01/13/22 LOV was 08/08/21  Elliot Cousin MD  Refill approved.

## 2022-01-13 ENCOUNTER — Ambulatory Visit (INDEPENDENT_AMBULATORY_CARE_PROVIDER_SITE_OTHER): Payer: Medicare Other | Admitting: *Deleted

## 2022-01-13 DIAGNOSIS — I4819 Other persistent atrial fibrillation: Secondary | ICD-10-CM | POA: Diagnosis not present

## 2022-01-13 DIAGNOSIS — Z5181 Encounter for therapeutic drug level monitoring: Secondary | ICD-10-CM

## 2022-01-13 LAB — POCT INR: INR: 3.6 — AB (ref 2.0–3.0)

## 2022-01-13 NOTE — Patient Instructions (Signed)
Description   Hold warfarin today then start taking 1 tablet daily except 1.5 tablets on Monday and Friday. Recheck INR in 3 weeks. [facility]: [phone] Main number: [phone] Fax number [phone]

## 2022-01-20 DIAGNOSIS — H02834 Dermatochalasis of left upper eyelid: Secondary | ICD-10-CM | POA: Diagnosis not present

## 2022-01-20 DIAGNOSIS — H02831 Dermatochalasis of right upper eyelid: Secondary | ICD-10-CM | POA: Diagnosis not present

## 2022-01-20 DIAGNOSIS — E119 Type 2 diabetes mellitus without complications: Secondary | ICD-10-CM | POA: Diagnosis not present

## 2022-01-20 DIAGNOSIS — H04123 Dry eye syndrome of bilateral lacrimal glands: Secondary | ICD-10-CM | POA: Diagnosis not present

## 2022-01-20 LAB — HM DIABETES EYE EXAM

## 2022-01-29 ENCOUNTER — Encounter: Payer: Self-pay | Admitting: Internal Medicine

## 2022-01-29 ENCOUNTER — Other Ambulatory Visit: Payer: Self-pay

## 2022-01-29 DIAGNOSIS — M1A9XX0 Chronic gout, unspecified, without tophus (tophi): Secondary | ICD-10-CM

## 2022-01-29 MED ORDER — ALLOPURINOL 300 MG PO TABS: ORAL_TABLET | ORAL | 1 refills | Status: DC

## 2022-02-03 ENCOUNTER — Ambulatory Visit: Payer: Medicare Other | Attending: Interventional Cardiology | Admitting: *Deleted

## 2022-02-03 DIAGNOSIS — I4819 Other persistent atrial fibrillation: Secondary | ICD-10-CM | POA: Diagnosis not present

## 2022-02-03 DIAGNOSIS — Z5181 Encounter for therapeutic drug level monitoring: Secondary | ICD-10-CM | POA: Diagnosis not present

## 2022-02-03 LAB — POCT INR: INR: 3.7 — AB (ref 2.0–3.0)

## 2022-02-03 NOTE — Patient Instructions (Signed)
Description   Hold warfarin today then start taking 1 tablet daily except 1.5 tablets on Monday. Recheck INR in 3 weeks. [facility] Clinic: [phone] or [phone] Main number: [phone] Fax number [phone]

## 2022-02-10 DIAGNOSIS — H26493 Other secondary cataract, bilateral: Secondary | ICD-10-CM | POA: Diagnosis not present

## 2022-02-18 ENCOUNTER — Encounter: Payer: Self-pay | Admitting: Internal Medicine

## 2022-02-25 ENCOUNTER — Ambulatory Visit (INDEPENDENT_AMBULATORY_CARE_PROVIDER_SITE_OTHER): Payer: Medicare Other | Admitting: Nurse Practitioner

## 2022-02-25 ENCOUNTER — Ambulatory Visit: Payer: Medicare Other | Attending: Interventional Cardiology | Admitting: *Deleted

## 2022-02-25 ENCOUNTER — Encounter: Payer: Self-pay | Admitting: Nurse Practitioner

## 2022-02-25 VITALS — BP 138/76 | HR 69 | Temp 97.9°F | Ht 71.0 in | Wt 246.0 lb

## 2022-02-25 DIAGNOSIS — E785 Hyperlipidemia, unspecified: Secondary | ICD-10-CM

## 2022-02-25 DIAGNOSIS — I2510 Atherosclerotic heart disease of native coronary artery without angina pectoris: Secondary | ICD-10-CM

## 2022-02-25 DIAGNOSIS — E1169 Type 2 diabetes mellitus with other specified complication: Secondary | ICD-10-CM | POA: Diagnosis not present

## 2022-02-25 DIAGNOSIS — I4819 Other persistent atrial fibrillation: Secondary | ICD-10-CM

## 2022-02-25 DIAGNOSIS — E559 Vitamin D deficiency, unspecified: Secondary | ICD-10-CM | POA: Diagnosis not present

## 2022-02-25 DIAGNOSIS — E1122 Type 2 diabetes mellitus with diabetic chronic kidney disease: Secondary | ICD-10-CM

## 2022-02-25 DIAGNOSIS — I10 Essential (primary) hypertension: Secondary | ICD-10-CM | POA: Diagnosis not present

## 2022-02-25 DIAGNOSIS — M1A9XX0 Chronic gout, unspecified, without tophus (tophi): Secondary | ICD-10-CM

## 2022-02-25 DIAGNOSIS — H26492 Other secondary cataract, left eye: Secondary | ICD-10-CM | POA: Diagnosis not present

## 2022-02-25 DIAGNOSIS — J849 Interstitial pulmonary disease, unspecified: Secondary | ICD-10-CM

## 2022-02-25 DIAGNOSIS — Z5181 Encounter for therapeutic drug level monitoring: Secondary | ICD-10-CM | POA: Diagnosis not present

## 2022-02-25 DIAGNOSIS — E039 Hypothyroidism, unspecified: Secondary | ICD-10-CM

## 2022-02-25 DIAGNOSIS — N182 Chronic kidney disease, stage 2 (mild): Secondary | ICD-10-CM

## 2022-02-25 DIAGNOSIS — Z79899 Other long term (current) drug therapy: Secondary | ICD-10-CM

## 2022-02-25 LAB — POCT INR: INR: 2.9 (ref 2.0–3.0)

## 2022-02-25 NOTE — Progress Notes (Signed)
FOLLOW UP   Assessment:   Christopher Wade was seen today for medicare wellness.  Diagnoses and all orders for this visit:   Essential hypertension Discussed DASH (Dietary Approaches to Stop Hypertension) DASH diet is lower in sodium than a typical American diet. Cut back on foods that are high in saturated fat, cholesterol, and trans fats. Eat more whole-grain foods, fish, poultry, and nuts Remain active and exercise as tolerated daily.  Monitor BP at home-Call if greater than 130/80.  Check CMP/CBC   Hyperlipidemia associated with type 2 diabetes mellitus ([place]) Discussed lifestyle modifications. Recommended diet heavy in fruits and veggies, omega 3's. Decrease consumption of animal meats, cheeses, and dairy products. Remain active and exercise as tolerated. Continue to monitor. Check lipids/TSH   Persistent atrial fibrillation [facility]) Doing well s/p ablation off AAD therapy Continue coumadin via cardiology for [facility] of 4 Continue following up with cardio/ coumadin clinic   CKD stage 2 due to type 2 diabetes mellitus ([place]) Discussed how what you eat and drink can aide in kidney protection. Stay well hydrated. Avoid high salt foods. Avoid NSAIDS. Keep BP and BG well controlled.   Take medications as prescribed. Remain active and exercise as tolerated daily. Maintain weight.  Continue to monitor. Check CMP/GFR/Microablumin    Type 2 diabetes mellitus with stage 2 chronic kidney disease, without long-term current use of insulin [facility]) Education: Reviewed 'ABCs' of diabetes management  Discussed goals to be met and/or maintained include A1C (<7) Blood pressure (<130/80) Cholesterol (LDL <70) Continue Eye Exam yearly  Continue Dental Exam Q6 mo Discussed dietary recommendations Discussed Physical Activity recommendations Foot exam UTD Check A1C   Chronic gout without tophus, unspecified cause, unspecified site Continue Allopurinol 360m daily No recent flares Low  purine diet discussed Continue to monitor  Coronary artery disease involving native coronary artery of native heart without angina pectoris Discussed dietary and exercise modifications Blood pressure and cholesterol control Per CT 01/29/21  ILD Continue to monitor Cardiology following No maintenance medication  Morbid obesity ([place] -  BMI 30+ with OSA Discussed appropriate BMI Goal of losing 1 lb per month. Diet modification. Physical activity. Encouraged/praised to build confidence.   Hypothyroidism, Controlled. Continue Levothyroxine. Reminded to take on an empty stomach 30-644ms before food.  Stop any Biotin Supplement 48-72 hours before next TSH level to reduce the risk of falsely low TSH levels. Continue to monitor.     Vitamin D deficiency Continue supplementation Check and monitor levels  Medication management All medications discussed and reviewed in full. All questions and concerns regarding medications addressed.    Orders Placed This Encounter  Procedures   CBC with Differential/Platelet   COMPLETE METABOLIC PANEL WITH GFR   Lipid panel   Hemoglobin A1c   VITAMIN D 25 Hydroxy (Vit-D Deficiency, Fractures)   TSH    Over 20 minutes of face to face interview, exam, counseling, chart review, and critical decision making was performed  Future Appointments  Date Time Provider [place]10/26/2023  2:00 PM [facility] None  08/19/2022  4:00 PM CrDarrol JumpNP [facility] None  11/24/2022  3:00 PM McUnk PintoMD [facility] None   Subjective:  Christopher CARILLOs a [age].o. male who presents for Medicare Annual Wellness Visit and 3 month follow up. 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  Overall he reports feeling well.    He just followed up with Dr. Katy Fitch this morning with eye exam.   He has been taking pantoprazole 40 mg now daily decreased from BID to once daily and reports sx remain well controlled.   OSA recommended CPAP but patient declined, declines referral.   ILD/ dyspnea improved off of amiodarone, did see Dr. Chase Caller in 2018, was stable/negative labs and no follow up since.   BMI is Body mass index is 34.31 kg/m.,  Wt Readings from Last 3 Encounters:  02/25/22 246 lb (111.6 kg)  11/19/21 253 lb 6.4 oz (114.9 kg)  08/14/21 258 lb (117 kg)   He has aortic atherosclerosis per CT chest in 2018.  Patient has history of Afib (2016) and had CV x 2 (2016 & 2017) and then Jan 2018 had an Afib Ablation (Dr Curt Bears) without noted recurrence, does remain on coumadin managed by coumadin clinic (couldn't afford xarelto) followed by Dr Fransico Him.  His blood pressure has been controlled at home, today their BP is BP: 138/76,  Reports he checks intermittently at home and to goal. He does workout by walking 7 days a week. He denies chest pain, shortness of breath, dizziness.   He is on cholesterol medication (atorvastatin 80 mg) and denies myalgias. His cholesterol is at goal. The cholesterol last visit was:   Lab Results  Component Value Date   CHOL 96 11/19/2021   HDL  32 (L) 11/19/2021   LDLCALC 38 11/19/2021   TRIG 184 (H) 11/19/2021   CHOLHDL 3.0 11/19/2021   He has been working on diet and exercise for diabetes (metformin 1000 mg BID) with CKD II, and denies hyperglycemia, hypoglycemia , increased appetite, nausea, polydipsia, polyuria, visual disturbances and vomiting.  Fasting ranges 107-130.  Last A1C in the office was:  Lab Results  Component Value Date   HGBA1C 7.5 (H) 11/19/2021   Last GFR Lab Results  Component Value Date   EGFR 49 (L) 11/19/2021   Taking 100 mcg levothyroxine daily without recent dose change.  Lab Results  Component Value Date   TSH 4.50 11/19/2021   Patient is on Vitamin D supplement.   Lab Results  Component Value Date   VD25OH 54 11/19/2021       Medication Review:  Current Outpatient Medications (Endocrine & Metabolic):    levothyroxine (SYNTHROID) 100 MCG tablet, TAKE 1 TABLET DAILY ON EMPTY STOMACH WITH WATER FOR 30 MINS& NO ANTACID MEDS, CALCIUM, OR MAGNESIUM FOR 4 HOURS. AVOID BIOTIN.   metFORMIN (GLUCOPHAGE-XR) 500 MG 24 hr tablet, Take two tablets daily with meals for Diabetes   tirzepatide [facility]) 2.5 MG/0.5ML Pen, Inject 2.5 mg into the skin each Week for Diabetes  ( Dx:  e11.29 )  Current Outpatient Medications (Cardiovascular):    amLODipine (NORVASC) 10 MG tablet, Take 1 tab  Daily For BP & Heart   atorvastatin (LIPITOR) 80 MG tablet, TAKE 1/2 TABLET(40 MG) BY MOUTH DAILY  carvedilol (COREG) 12.5 MG tablet, Take 2 tablets (25 mg total) by mouth 2 (two) times daily with a meal. (Patient taking differently: Take 25 mg by mouth 2 (two) times daily with a meal. Taking 1 tablet twice a day)   furosemide (LASIX) 40 MG tablet, TAKE 1 TABLET BY MOUTH 1 TO 2 TIMES PER DAY AS NEEDED FOR FLUID RETENTION OR ANKLE SWELLING   olmesartan (BENICAR) 40 MG tablet, TAKE 1 TABLET BY MOUTH DAILY FOR BLOOD PRESSURE  Current Outpatient Medications (Respiratory):    loratadine (CLARITIN) 10 MG tablet, Take 10 mg  by mouth daily.  Current Outpatient Medications (Analgesics):    acetaminophen (TYLENOL) 325 MG tablet, Take 325 mg by mouth as needed for mild pain.   allopurinol (ZYLOPRIM) 300 MG tablet, TAKE 1 TABLET BY MOUTH DAILY FOR GOUT PREVENTION  Current Outpatient Medications (Hematological):    Ferrous Sulfate (IRON) 325 (65 Fe) MG TABS, Take 1 tablet (325 mg total) by mouth every other day.   warfarin (COUMADIN) 5 MG tablet, Take 1 tablet daily except 1 1/2 tablets on Mondays, Wednesdays and Fridays or as directed.  Current Outpatient Medications (Other):    Ascorbic Acid (VITAMIN C WITH ROSE HIPS) 500 MG tablet, Take 500 mg by mouth daily.   B Complex Vitamins (B COMPLEX PO), Take 1 tablet by mouth daily.   cholecalciferol (VITAMIN D) 1000 UNITS tablet, Take 2,000 Units by mouth daily.   magnesium gluconate (MAGONATE) 500 MG tablet, Take 529m TID and with meals.   Omega-3 Fatty Acids (FISH OIL) 1200 MG CAPS, Take 1,200 mg by mouth daily.   pantoprazole (PROTONIX) 40 MG tablet, TAKE 1 TABLET EVERY DAY BEFORE BREAKFAST   zinc gluconate 50 MG tablet, Take 50 mg by mouth daily.  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   Screening Tests: Immunization History  Administered Date(s) Administered   Influenza Split 04/05/2014   Influenza, High Dose Seasonal PF 03/31/2018, 03/20/2019, 05/07/2020, 05/28/2021   Influenza-Unspecified 02/02/2013, 05/21/2016   PFIZER Comirnaty(Gray Top)Covid-19 Tri-Sucrose Vaccine 05/30/2021   PFIZER(Purple Top)SARS-COV-2 Vaccination 12/13/2019, 01/03/2020, 07/09/2020   Pneumococcal Conjugate-13 05/01/2014   Pneumococcal Polysaccharide-23 05/16/2015   Td 04/25/2010, 05/07/2020   Varicella Zoster Immune Globulin 02/22/2014   Health Maintenance  Topic Date Due   Zoster Vaccines- Shingrix (1 of 2) Never done   COVID-19 Vaccine (5 - Pfizer risk series) 07/25/2021   FOOT EXAM  11/06/2021   INFLUENZA VACCINE  12/23/2021   HEMOGLOBIN A1C  05/21/2022   COLONOSCOPY (Pts 45-426yrInsurance coverage will need to be confirmed)  10/06/2022   Diabetic kidney evaluation - GFR measurement  11/20/2022   Diabetic kidney evaluation - Urine ACR  11/20/2022   OPHTHALMOLOGY EXAM  01/21/2023   TETANUS/TDAP  05/07/2030   Pneumonia Vaccine 20+ Years old  Completed   Hepatitis C Screening  Completed   HPV VACCINES  Aged Out    Names of Other Physician/Practitioners you currently use: 1. [facility] here for primary care 2. Dr. Katy Fitch, Eye exam 01/14/2021, negative for retinopathy  3. Dentist, last 05/2021, goes q62m Patient Care Team: MUnk Pinto MD as PCP - General (Internal Medicine) CConstance Haw MD as PCP - Electrophysiology (Cardiology) TSueanne Margarita MD as PCP - Cardiology (Cardiology) GGatha Mayer MD as Consulting Physician (Gastroenterology) TSueanne Margarita MD as Consulting Physician  (Cardiology) ANewton Pigg [facility] LLCas Pharmacist (Pharmacist)  Surgical: He  has a past surgical history that includes Cardioversion (N/A, 04/05/2015); Cardioversion (N/A, 12/09/2015); Cardioversion (N/A, 03/16/2016); Cardiac catheterization (N/A, 06/11/2016); Total hip arthroplasty (Bilateral, 2001); Uvulectomy; Cardiac catheterization (N/A, 10/11/2015); Cardiac catheterization (N/A, 10/11/2015); Cardiac catheterization (N/A, 10/11/2015); Cardiac catheterization (N/A, 10/14/2015); Coronary angioplasty; Cataract extraction (Right, 2019); Esophagogastroduodenoscopy (egd) with propofol (N/A, 05/28/2020); and biopsy (05/28/2020). Family His family history includes Colon cancer in his sister; Liver disease in his brother, father, mother, and sister; Lung cancer in his father and mother; Prostate cancer in his brother. Social history  He reports that he has never smoked. He has never used smokeless tobacco. He reports that he does not drink alcohol and does not use drugs.    Objective:   Today's Vitals   02/25/22 1426  BP: 138/76  Pulse: 69  Temp: 97.9 F (36.6 C)  SpO2: 93%  Weight: 246 lb (111.6 kg)  Height: '5\' 11"'  (1.803 m)    Body mass index is 34.31 kg/m.  General appearance: alert, no distress, WD/WN, male HEENT: normocephalic, sclerae anicteric, TMs pearly, nares patent, no discharge or erythema, pharynx normal Oral cavity: MMM, no lesions Neck: supple, no lymphadenopathy, no thyromegaly, no masses Heart: RRR, normal S1, S2, no murmurs Lungs: CTA bilaterally, no wheezes, rhonchi, or rales Abdomen: +bs, soft, non tender, non distended, no masses, no hepatomegaly, no splenomegaly Musculoskeletal: nontender, no swelling, no obvious deformity Extremities: no edema, no cyanosis, no clubbing Pulses: 2+ symmetric, upper and lower extremities, normal cap refill Neurological: alert, oriented x 3, CN2-12 intact, strength normal upper extremities and lower extremities, sensation normal throughout,  DTRs 2+ throughout, no cerebellar signs, gait normal Psychiatric: normal affect, behavior normal, pleasant   TDarrol Jump NP 2:48 PM [facility]

## 2022-02-25 NOTE — Patient Instructions (Addendum)
Description   Continue taking 1 tablet daily except 1.5 tablets on Monday. Add another leafy veggie to your diet. Recheck INR in 3 weeks. Coumadin Clinic: [phone] or [phone] Main number: [phone] Fax number [phone]

## 2022-02-25 NOTE — Patient Instructions (Signed)

## 2022-02-26 ENCOUNTER — Other Ambulatory Visit: Payer: Self-pay | Admitting: Nurse Practitioner

## 2022-02-26 DIAGNOSIS — E876 Hypokalemia: Secondary | ICD-10-CM

## 2022-02-26 LAB — CBC WITH DIFFERENTIAL/PLATELET
Absolute Monocytes: 670 cells/uL (ref 200–950)
Basophils Absolute: 44 cells/uL (ref 0–200)
Basophils Relative: 0.5 %
Eosinophils Absolute: 122 cells/uL (ref 15–500)
Eosinophils Relative: 1.4 %
HCT: 37.4 % — ABNORMAL LOW (ref 38.5–50.0)
Hemoglobin: 12.9 g/dL — ABNORMAL LOW (ref 13.2–17.1)
Lymphs Abs: 1888 cells/uL (ref 850–3900)
MCH: 34.7 pg — ABNORMAL HIGH (ref 27.0–33.0)
MCHC: 34.5 g/dL (ref 32.0–36.0)
MCV: 100.5 fL — ABNORMAL HIGH (ref 80.0–100.0)
MPV: 10.5 fL (ref 7.5–12.5)
Monocytes Relative: 7.7 %
Neutro Abs: 5977 cells/uL (ref 1500–7800)
Neutrophils Relative %: 68.7 %
Platelets: 267 10*3/uL (ref 140–400)
RBC: 3.72 10*6/uL — ABNORMAL LOW (ref 4.20–5.80)
RDW: 15 % (ref 11.0–15.0)
Total Lymphocyte: 21.7 %
WBC: 8.7 10*3/uL (ref 3.8–10.8)

## 2022-02-26 LAB — HEMOGLOBIN A1C
Hgb A1c MFr Bld: 6.6 % of total Hgb — ABNORMAL HIGH (ref <5.7)
Mean Plasma Glucose: 143 mg/dL
eAG (mmol/L): 7.9 mmol/L

## 2022-02-26 LAB — COMPLETE METABOLIC PANEL WITH GFR
AG Ratio: 1.5 (calc) (ref 1.0–2.5)
ALT: 12 U/L (ref 9–46)
AST: 14 U/L (ref 10–35)
Albumin: 4.1 g/dL (ref 3.6–5.1)
Alkaline phosphatase (APISO): 81 U/L (ref 35–144)
BUN/Creatinine Ratio: 14 (calc) (ref 6–22)
BUN: 22 mg/dL (ref 7–25)
CO2: 30 mmol/L (ref 20–32)
Calcium: 8.2 mg/dL — ABNORMAL LOW (ref 8.6–10.3)
Chloride: 97 mmol/L — ABNORMAL LOW (ref 98–110)
Creat: 1.55 mg/dL — ABNORMAL HIGH (ref 0.70–1.28)
Globulin: 2.7 g/dL (calc) (ref 1.9–3.7)
Glucose, Bld: 125 mg/dL — ABNORMAL HIGH (ref 65–99)
Potassium: 3.3 mmol/L — ABNORMAL LOW (ref 3.5–5.3)
Sodium: 142 mmol/L (ref 135–146)
Total Bilirubin: 0.9 mg/dL (ref 0.2–1.2)
Total Protein: 6.8 g/dL (ref 6.1–8.1)
eGFR: 48 mL/min/{1.73_m2} — ABNORMAL LOW (ref >=60)

## 2022-02-26 LAB — LIPID PANEL
Cholesterol: 101 mg/dL (ref <200)
HDL: 33 mg/dL — ABNORMAL LOW (ref >=40)
LDL Cholesterol (Calc): 43 mg/dL (calc)
Non-HDL Cholesterol (Calc): 68 mg/dL (calc) (ref <130)
Total CHOL/HDL Ratio: 3.1 (calc) (ref <5.0)
Triglycerides: 173 mg/dL — ABNORMAL HIGH (ref <150)

## 2022-02-26 LAB — VITAMIN D 25 HYDROXY (VIT D DEFICIENCY, FRACTURES): Vit D, 25-Hydroxy: 50 ng/mL (ref 30–100)

## 2022-02-26 LAB — TSH: TSH: 3.51 mIU/L (ref 0.40–4.50)

## 2022-03-05 ENCOUNTER — Ambulatory Visit (INDEPENDENT_AMBULATORY_CARE_PROVIDER_SITE_OTHER): Payer: Medicare Other

## 2022-03-05 DIAGNOSIS — E876 Hypokalemia: Secondary | ICD-10-CM

## 2022-03-05 NOTE — Progress Notes (Signed)
The patient came in for repeat labs today. He voiced no new complaints.

## 2022-03-06 LAB — POTASSIUM: Potassium: 3.6 mmol/L (ref 3.5–5.3)

## 2022-03-19 ENCOUNTER — Ambulatory Visit: Payer: Medicare Other | Attending: Interventional Cardiology

## 2022-03-19 DIAGNOSIS — I4819 Other persistent atrial fibrillation: Secondary | ICD-10-CM

## 2022-03-19 DIAGNOSIS — Z5181 Encounter for therapeutic drug level monitoring: Secondary | ICD-10-CM | POA: Diagnosis not present

## 2022-03-19 LAB — POCT INR: INR: 1.3 — AB (ref 2.0–3.0)

## 2022-03-19 NOTE — Patient Instructions (Signed)
Description   Take 1.5 tablets today and 1.5 tablets tomorrow and then continue taking 1 tablet daily except 1.5 tablets on Monday.  Stay consistent with greens each week (4 times per week)  Recheck INR in 2 weeks.  Coumadin Clinic:[phone] Fax number [phone]

## 2022-04-02 ENCOUNTER — Ambulatory Visit: Payer: Medicare Other | Attending: Cardiovascular Disease

## 2022-04-02 DIAGNOSIS — Z5181 Encounter for therapeutic drug level monitoring: Secondary | ICD-10-CM

## 2022-04-02 DIAGNOSIS — Z7901 Long term (current) use of anticoagulants: Secondary | ICD-10-CM

## 2022-04-02 LAB — POCT INR: INR: 2.2 (ref 2.0–3.0)

## 2022-04-02 NOTE — Patient Instructions (Signed)
Description   Continue taking 1 tablet daily except 1.5 tablets on Monday.  Stay consistent with greens each week (2-3 times per week)  Recheck INR in 3 weeks.  Coumadin [phone] Fax number [phone]

## 2022-04-09 ENCOUNTER — Other Ambulatory Visit: Payer: Self-pay | Admitting: Nurse Practitioner

## 2022-04-28 ENCOUNTER — Ambulatory Visit: Payer: Medicare Other | Attending: Interventional Cardiology | Admitting: *Deleted

## 2022-04-28 DIAGNOSIS — Z5181 Encounter for therapeutic drug level monitoring: Secondary | ICD-10-CM | POA: Diagnosis not present

## 2022-04-28 DIAGNOSIS — I4819 Other persistent atrial fibrillation: Secondary | ICD-10-CM

## 2022-04-28 LAB — POCT INR: INR: 2.8 (ref 2.0–3.0)

## 2022-04-28 NOTE — Patient Instructions (Signed)
Description   Continue taking 1 tablet daily except 1.5 tablets on Monday. Stay consistent with greens each week (2-3 times per week). Recheck INR in 4 weeks.  Coumadin Clinic:[phone] Fax number [phone]

## 2022-05-26 ENCOUNTER — Other Ambulatory Visit: Payer: Self-pay | Admitting: Nurse Practitioner

## 2022-05-26 ENCOUNTER — Ambulatory Visit: Payer: Medicare Other | Attending: Internal Medicine

## 2022-05-26 DIAGNOSIS — I4819 Other persistent atrial fibrillation: Secondary | ICD-10-CM

## 2022-05-26 DIAGNOSIS — Z5181 Encounter for therapeutic drug level monitoring: Secondary | ICD-10-CM

## 2022-05-26 DIAGNOSIS — R609 Edema, unspecified: Secondary | ICD-10-CM

## 2022-05-26 LAB — POCT INR: INR: 3 (ref 2.0–3.0)

## 2022-05-26 NOTE — Patient Instructions (Signed)
Continue taking 1 tablet daily except 1.5 tablets on Monday. Stay consistent with greens each week (2-3 times per week). Recheck INR in 6 weeks.  Coumadin Clinic:[phone] Fax number [phone]

## 2022-05-28 ENCOUNTER — Ambulatory Visit (INDEPENDENT_AMBULATORY_CARE_PROVIDER_SITE_OTHER): Payer: Medicare Other | Admitting: Nurse Practitioner

## 2022-05-28 ENCOUNTER — Encounter: Payer: Self-pay | Admitting: Nurse Practitioner

## 2022-05-28 VITALS — BP 140/72 | HR 63 | Temp 97.9°F | Ht 71.0 in | Wt 246.4 lb

## 2022-05-28 DIAGNOSIS — J849 Interstitial pulmonary disease, unspecified: Secondary | ICD-10-CM

## 2022-05-28 DIAGNOSIS — E039 Hypothyroidism, unspecified: Secondary | ICD-10-CM

## 2022-05-28 DIAGNOSIS — Z79899 Other long term (current) drug therapy: Secondary | ICD-10-CM

## 2022-05-28 DIAGNOSIS — E785 Hyperlipidemia, unspecified: Secondary | ICD-10-CM

## 2022-05-28 DIAGNOSIS — M1A9XX0 Chronic gout, unspecified, without tophus (tophi): Secondary | ICD-10-CM

## 2022-05-28 DIAGNOSIS — I2510 Atherosclerotic heart disease of native coronary artery without angina pectoris: Secondary | ICD-10-CM

## 2022-05-28 DIAGNOSIS — E1169 Type 2 diabetes mellitus with other specified complication: Secondary | ICD-10-CM | POA: Diagnosis not present

## 2022-05-28 DIAGNOSIS — I10 Essential (primary) hypertension: Secondary | ICD-10-CM

## 2022-05-28 DIAGNOSIS — N182 Chronic kidney disease, stage 2 (mild): Secondary | ICD-10-CM

## 2022-05-28 DIAGNOSIS — E1122 Type 2 diabetes mellitus with diabetic chronic kidney disease: Secondary | ICD-10-CM | POA: Diagnosis not present

## 2022-05-28 DIAGNOSIS — I4819 Other persistent atrial fibrillation: Secondary | ICD-10-CM

## 2022-05-28 NOTE — Progress Notes (Signed)
FOLLOW UP  Assessment:   Christopher Wade was seen today for medicare wellness.  Diagnoses and all orders for this visit:   Essential hypertension Discussed DASH (Dietary Approaches to Stop Hypertension) DASH diet is lower in sodium than a typical American diet. Cut back on foods that are high in saturated fat, cholesterol, and trans fats. Eat more whole-grain foods, fish, poultry, and nuts Remain active and exercise as tolerated daily.  Monitor BP at home-Call if greater than 130/80.  Check CMP/CBC   Hyperlipidemia associated with type 2 diabetes mellitus ([place]) Discussed lifestyle modifications. Recommended diet heavy in fruits and veggies, omega 3's. Decrease consumption of animal meats, cheeses, and dairy products. Remain active and exercise as tolerated. Continue to monitor. Check lipids/TSH   Persistent atrial fibrillation [facility]) Doing well s/p ablation off AAD therapy Continue coumadin via cardiology for [facility] of 4 Continue following up with cardio/ coumadin clinic   CKD stage 2 due to type 2 diabetes mellitus ([place]) Discussed how what you eat and drink can aide in kidney protection. Stay well hydrated. Avoid high salt foods. Avoid NSAIDS. Keep BP and BG well controlled.   Take medications as prescribed. Remain active and exercise as tolerated daily. Maintain weight.  Continue to monitor. Check CMP/GFR/Microablumin    Type 2 diabetes mellitus with stage 2 chronic kidney disease, without long-term current use of insulin [facility]) Education: Reviewed 'ABCs' of diabetes management  Discussed goals to be met and/or maintained include A1C (<7) Blood pressure (<130/80) Cholesterol (LDL <70) Continue Eye Exam yearly  Continue Dental Exam Q6 mo Discussed dietary recommendations Discussed Physical Activity recommendations Foot exam UTD Check A1C   Chronic gout without tophus, unspecified cause, unspecified site Continue Allopurinol 361m daily No recent flares Low  purine diet discussed Continue to monitor  Coronary artery disease involving native coronary artery of native heart without angina pectoris Discussed dietary and exercise modifications Blood pressure and cholesterol control Per CT 01/29/21  ILD Continue to monitor Cardiology following No maintenance medication  Morbid obesity ([place] -  BMI 30+ with OSA Discussed appropriate BMI Goal of losing 1 lb per month. Diet modification. Physical activity. Encouraged/praised to build confidence.   Hypothyroidism, Controlled. Continue Levothyroxine. Reminded to take on an empty stomach 30-626ms before food.  Stop any Biotin Supplement 48-72 hours before next TSH level to reduce the risk of falsely low TSH levels. Continue to monitor.    Medication management All medications discussed and reviewed in full. All questions and concerns regarding medications addressed.    Orders Placed This Encounter  Procedures   CBC with Differential/Platelet   COMPLETE METABOLIC PANEL WITH GFR   Lipid panel   TSH   Hemoglobin A1c    Over 20 minutes of face to face interview, exam, counseling, chart review, and critical decision making was performed  Future Appointments  Date Time Provider [place]1/22/2024 11:40 AM TuSueanne MargaritaMD [facility]  07/07/2022  2:00 PM [facility] None  08/04/2022  1:30 PM UrBaldwin JamaicaPA-C [facility]  08/27/2022  2:30 PM CrDarrol JumpNP [facility] None  12/02/2022  2:00 PM McUnk PintoMD [facility] None   Subjective:  Christopher SNOWDONs a [age].o. male who presents for a follow up. 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  Overall he reports feeling well.    He has been taking pantoprazole 40 mg now daily decreased from BID to once daily and reports sx remain well controlled.   OSA recommended CPAP but patient declined, declines referral.   ILD/ dyspnea improved off of amiodarone, did see Dr. Chase Caller in 2018, was stable/negative labs and no follow up since.   BMI is Body mass index is 34.37 kg/m.,  Wt Readings from Last 3 Encounters:  05/28/22 246 lb 6.4 oz (111.8 kg)  03/05/22 249 lb 12.8 oz (113.3 kg)  02/25/22 246 lb (111.6 kg)   He has aortic atherosclerosis per CT chest in 2018.  Patient has history of Afib (2016) and had CV x 2 (2016 & 2017) and then Jan 2018 had an Afib Ablation (Dr Curt Bears) without noted recurrence, does remain on coumadin managed by coumadin clinic (couldn't afford xarelto) followed by Dr Fransico Him.  His blood pressure has been controlled at home, today their BP is BP: (!) 140/72,  Reports he checks intermittently at home and to goal. He does workout by walking 7 days a week. He denies chest pain, shortness of breath, dizziness.   He is on cholesterol medication (atorvastatin 80 mg) and denies myalgias. His cholesterol is at goal. The cholesterol last visit was:   Lab Results  Component Value Date   CHOL 101 02/25/2022   HDL 33 (L) 02/25/2022   LDLCALC 43 02/25/2022   TRIG 173 (H) 02/25/2022   CHOLHDL 3.1  02/25/2022   He has been working on diet and exercise for diabetes (metformin 1000 mg BID) with CKD II, and denies hyperglycemia, hypoglycemia , increased appetite, nausea, polydipsia, polyuria, visual disturbances and vomiting.  Fasting ranges 107-130.  Last A1C in the office was:  Lab Results  Component Value Date   HGBA1C 6.6 (H) 02/25/2022   Last GFR Lab Results  Component Value Date   EGFR 48 (L) 02/25/2022   Taking 100 mcg levothyroxine daily without recent dose change.  Lab Results  Component Value Date   TSH 3.51 02/25/2022   Patient is on Vitamin D supplement.   Lab Results  Component Value Date   VD25OH 50 02/25/2022       Medication Review:  Current Outpatient Medications (Endocrine & Metabolic):    levothyroxine (SYNTHROID) 100 MCG tablet, TAKE 1 TABLET DAILY ON EMPTY STOMACH WITH WATER FOR 30 MINS& NO ANTACID MEDS, CALCIUM, OR MAGNESIUM FOR 4 HOURS. AVOID BIOTIN.   metFORMIN (GLUCOPHAGE-XR) 500 MG 24 hr tablet, Take two tablets daily with meals for Diabetes   tirzepatide (MOUNJARO) 2.5 MG/0.5ML Pen, Inject 2.5 mg into the skin each Week for Diabetes  ( Dx:  e11.29 )  Current Outpatient Medications (Cardiovascular):    amLODipine (NORVASC) 10 MG tablet, Take 1 tab  Daily For BP & Heart   atorvastatin (LIPITOR) 80 MG tablet, TAKE 1/2 TABLET(40 MG) BY MOUTH DAILY   carvedilol (COREG) 12.5 MG tablet, Take 2 tablets (25 mg total) by mouth 2 (two) times  daily with a meal. (Patient taking differently: Take 25 mg by mouth 2 (two) times daily with a meal. Taking 1 tablet twice a day)   furosemide (LASIX) 40 MG tablet, TAKE 1 TABLET BY MOUTH 1 TO 2 TIMES PER DAY AS NEEDED FOR FLUID RETENTION OR ANKLE SWELLING   olmesartan (BENICAR) 40 MG tablet, TAKE 1 TABLET BY MOUTH DAILY FOR BLOOD PRESSURE  Current Outpatient Medications (Respiratory):    loratadine (CLARITIN) 10 MG tablet, Take 10 mg by mouth daily.  Current Outpatient Medications (Analgesics):    acetaminophen  (TYLENOL) 325 MG tablet, Take 325 mg by mouth as needed for mild pain.   allopurinol (ZYLOPRIM) 300 MG tablet, TAKE 1 TABLET BY MOUTH DAILY FOR GOUT PREVENTION  Current Outpatient Medications (Hematological):    Ferrous Sulfate (IRON) 325 (65 Fe) MG TABS, Take 1 tablet (325 mg total) by mouth every other day.   warfarin (COUMADIN) 5 MG tablet, Take 1 tablet daily except 1 1/2 tablets on Mondays, Wednesdays and Fridays or as directed. (Patient taking differently: Take 1 tablet daily except 1 1/2 tablets on Mondays)  Current Outpatient Medications (Other):    Ascorbic Acid (VITAMIN C WITH ROSE HIPS) 500 MG tablet, Take 500 mg by mouth daily.   B Complex Vitamins (B COMPLEX PO), Take 1 tablet by mouth daily.   cholecalciferol (VITAMIN D) 1000 UNITS tablet, Take 2,000 Units by mouth daily.   magnesium gluconate (MAGONATE) 500 MG tablet, Take 575m TID and with meals.   Omega-3 Fatty Acids (FISH OIL) 1200 MG CAPS, Take 1,200 mg by mouth daily.   pantoprazole (PROTONIX) 40 MG tablet, TAKE 1 TABLET BY MOUTH EVERY DAY BEFORE BREAKFAST   zinc gluconate 50 MG tablet, Take 50 mg by mouth daily.  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   Screening Tests: Immunization History  Administered Date(s) Administered   Influenza Split 04/05/2014   Influenza, High Dose Seasonal PF 03/31/2018, 03/20/2019, 05/07/2020, 05/28/2021   Influenza-Unspecified 02/02/2013, 05/21/2016   PFIZER Comirnaty(Gray Top)Covid-19 Tri-Sucrose Vaccine 05/30/2021   PFIZER(Purple Top)SARS-COV-2 Vaccination 12/13/2019, 01/03/2020, 07/09/2020   Pneumococcal Conjugate-13 05/01/2014   Pneumococcal Polysaccharide-23 05/16/2015   Td 04/25/2010, 05/07/2020   Varicella Zoster Immune Globulin 02/22/2014   Health Maintenance  Topic Date Due   Medicare Annual Wellness (AWV)  Never done   Zoster Vaccines- Shingrix (1 of 2) Never done   FOOT EXAM  11/06/2021   INFLUENZA VACCINE  12/23/2021   COVID-19 Vaccine (5 - 2023-24 season) 01/23/2022   HEMOGLOBIN A1C  08/27/2022   COLONOSCOPY (Pts 45-467yrInsurance coverage will need to be confirmed)  10/06/2022  Diabetic kidney evaluation - Urine ACR  11/20/2022   OPHTHALMOLOGY EXAM  01/21/2023   Diabetic kidney evaluation - eGFR measurement  02/26/2023   DTaP/Tdap/Td (3 - Tdap) 05/07/2030   Pneumonia Vaccine 26+ Years old  Completed   Hepatitis C Screening  Completed   HPV VACCINES  Aged Out    Names of Other Physician/Practitioners you currently use: 1. [facility] here for primary care 2. Dr. Katy Fitch, Eye exam 01/14/2021, negative for retinopathy  3. Dentist, last 05/2021, goes q38m Patient Care Team: MUnk Pinto MD as PCP - General (Internal Medicine) CConstance Haw MD as PCP - Electrophysiology (Cardiology) TSueanne Margarita MD as PCP - Cardiology (Cardiology) GGatha Mayer MD as Consulting Physician  (Gastroenterology) TSueanne Margarita MD as Consulting Physician (Cardiology) [facility]ew[facility] Pigg [facility] as Pharmacist (Pharmacist)  Surgical: He  has a past surgical history that includes Cardioversion (N/A, 04/05/2015); Cardioversion (N/A, 12/09/2015); Cardioversion (N/A, 03/16/2016); Cardiac catheterization (N/A, 06/11/2016); Total hip arthroplasty (Bilateral, 2001); Uvulectomy; Cardiac catheterization (N/A, 10/11/2015); Cardiac catheterization (N/A, 10/11/2015); Cardiac catheterization (N/A, 10/11/2015); Cardiac catheterization (N/A, 10/14/2015); Coronary angioplasty; Cataract extraction (Right, 2019); Esophagogastroduodenoscopy (egd) with propofol (N/A, 05/28/2020); and biopsy (05/28/2020). Family His family history includes Colon cancer in his sister; Liver disease in his brother, father, mother, and sister; Lung cancer in his father and mother; Prostate cancer in his brother. Social history  He reports that he has never smoked. He has never used smokeless tobacco. He reports that he does not drink alcohol and does not use drugs.    Objective:   Today's Vitals   05/28/22 1423  BP: (!) 140/72  Pulse: 63  Temp: 97.9 F (36.6 C)  SpO2: 93%  Weight: 246 lb 6.4 oz (111.8 kg)  Height: _0  (1.803 m)    Body mass index is 34.37 kg/m.  General appearance: alert, no distress, WD/WN, male HEENT: normocephalic, sclerae anicteric, TMs pearly, nares patent, no discharge or erythema, pharynx normal Oral cavity: MMM, no lesions Neck: supple, no lymphadenopathy, no thyromegaly, no masses Heart: RRR, normal S1, S2, no murmurs Lungs: CTA bilaterally, no wheezes, rhonchi, or rales Abdomen: +bs, soft, non tender, non distended, no masses, no hepatomegaly, no splenomegaly Musculoskeletal: nontender, no swelling, no obvious deformity Extremities: no edema, no cyanosis, no clubbing Pulses: 2+ symmetric, upper and lower extremities, normal cap refill Neurological: alert, oriented x 3, CN2-12 intact,  strength normal upper extremities and lower extremities, sensation normal throughout, DTRs 2+ throughout, no cerebellar signs, gait normal Psychiatric: normal affect, behavior normal, pleasant   TDarrol Jump NP 2:58 PM [facility]

## 2022-05-28 NOTE — Patient Instructions (Signed)

## 2022-05-29 LAB — COMPLETE METABOLIC PANEL WITH GFR
AG Ratio: 1.2 (calc) (ref 1.0–2.5)
ALT: 21 U/L (ref 9–46)
AST: 18 U/L (ref 10–35)
Albumin: 4.1 g/dL (ref 3.6–5.1)
Alkaline phosphatase (APISO): 98 U/L (ref 35–144)
BUN: 13 mg/dL (ref 7–25)
CO2: 31 mmol/L (ref 20–32)
Calcium: 8.9 mg/dL (ref 8.6–10.3)
Chloride: 100 mmol/L (ref 98–110)
Creat: 1.03 mg/dL (ref 0.70–1.28)
Globulin: 3.3 g/dL (calc) (ref 1.9–3.7)
Glucose, Bld: 159 mg/dL — ABNORMAL HIGH (ref 65–139)
Potassium: 3.5 mmol/L (ref 3.5–5.3)
Sodium: 143 mmol/L (ref 135–146)
Total Bilirubin: 0.7 mg/dL (ref 0.2–1.2)
Total Protein: 7.4 g/dL (ref 6.1–8.1)
eGFR: 77 mL/min/{1.73_m2} (ref >=60)

## 2022-05-29 LAB — LIPID PANEL
Cholesterol: 123 mg/dL (ref <200)
HDL: 40 mg/dL (ref >=40)
LDL Cholesterol (Calc): 57 mg/dL (calc)
Non-HDL Cholesterol (Calc): 83 mg/dL (calc) (ref <130)
Total CHOL/HDL Ratio: 3.1 (calc) (ref <5.0)
Triglycerides: 185 mg/dL — ABNORMAL HIGH (ref <150)

## 2022-05-29 LAB — HEMOGLOBIN A1C
Hgb A1c MFr Bld: 7.6 % of total Hgb — ABNORMAL HIGH (ref <5.7)
Mean Plasma Glucose: 171 mg/dL
eAG (mmol/L): 9.5 mmol/L

## 2022-05-29 LAB — CBC WITH DIFFERENTIAL/PLATELET
Absolute Monocytes: 558 cells/uL (ref 200–950)
Basophils Absolute: 48 cells/uL (ref 0–200)
Basophils Relative: 0.7 %
Eosinophils Absolute: 714 cells/uL — ABNORMAL HIGH (ref 15–500)
Eosinophils Relative: 10.5 %
HCT: 40.9 % (ref 38.5–50.0)
Hemoglobin: 14.1 g/dL (ref 13.2–17.1)
Lymphs Abs: 1530 cells/uL (ref 850–3900)
MCH: 34.5 pg — ABNORMAL HIGH (ref 27.0–33.0)
MCHC: 34.5 g/dL (ref 32.0–36.0)
MCV: 100 fL (ref 80.0–100.0)
MPV: 10.2 fL (ref 7.5–12.5)
Monocytes Relative: 8.2 %
Neutro Abs: 3951 cells/uL (ref 1500–7800)
Neutrophils Relative %: 58.1 %
Platelets: 258 10*3/uL (ref 140–400)
RBC: 4.09 10*6/uL — ABNORMAL LOW (ref 4.20–5.80)
RDW: 14.5 % (ref 11.0–15.0)
Total Lymphocyte: 22.5 %
WBC: 6.8 10*3/uL (ref 3.8–10.8)

## 2022-05-29 LAB — TSH: TSH: 2.27 mIU/L (ref 0.40–4.50)

## 2022-06-01 ENCOUNTER — Other Ambulatory Visit: Payer: Self-pay | Admitting: Cardiology

## 2022-06-01 DIAGNOSIS — I10 Essential (primary) hypertension: Secondary | ICD-10-CM

## 2022-06-12 ENCOUNTER — Other Ambulatory Visit: Payer: Self-pay | Admitting: Cardiology

## 2022-06-12 ENCOUNTER — Other Ambulatory Visit: Payer: Self-pay | Admitting: Internal Medicine

## 2022-06-12 DIAGNOSIS — E039 Hypothyroidism, unspecified: Secondary | ICD-10-CM

## 2022-06-12 DIAGNOSIS — I10 Essential (primary) hypertension: Secondary | ICD-10-CM

## 2022-06-15 ENCOUNTER — Encounter: Payer: Self-pay | Admitting: Cardiology

## 2022-06-15 ENCOUNTER — Ambulatory Visit: Payer: Medicare Other | Attending: Cardiology | Admitting: Cardiology

## 2022-06-15 VITALS — BP 146/70 | HR 66 | Ht 71.0 in | Wt 243.6 lb

## 2022-06-15 DIAGNOSIS — I6523 Occlusion and stenosis of bilateral carotid arteries: Secondary | ICD-10-CM | POA: Diagnosis not present

## 2022-06-15 DIAGNOSIS — E1169 Type 2 diabetes mellitus with other specified complication: Secondary | ICD-10-CM

## 2022-06-15 DIAGNOSIS — I4819 Other persistent atrial fibrillation: Secondary | ICD-10-CM

## 2022-06-15 DIAGNOSIS — I2510 Atherosclerotic heart disease of native coronary artery without angina pectoris: Secondary | ICD-10-CM | POA: Diagnosis not present

## 2022-06-15 DIAGNOSIS — I10 Essential (primary) hypertension: Secondary | ICD-10-CM | POA: Diagnosis not present

## 2022-06-15 DIAGNOSIS — E785 Hyperlipidemia, unspecified: Secondary | ICD-10-CM

## 2022-06-15 NOTE — Addendum Note (Signed)
Addended by: Joni Reining on: 06/15/2022 11:45 AM   Modules accepted: Orders

## 2022-06-15 NOTE — Progress Notes (Signed)
Date:  06/15/2022   ID:  Christopher Wade, DOB 1950-06-04, MRN [id number]   PCP:  [name], MD  Cardiologist:  Fransico Him, MD  Electrophysiologist:  Constance Haw, MD   Chief Complaint:  Afib, HTN, CAD, HLD  History of Present Illness:    Christopher Wade is a 72 y.o. male  with a hx of persistent atrial fibrillation s/p  DCCV 03/2015 and again in July 2017 after Amio load. He ultimately underwent afib ablation 06/11/2016.  He was found to have early lung toxicity and his Amio was stopped. He also has a history of HTN, dyslipidemia, DM, ASCAD cath showing 50% LM, 40% distal LAD, 70% ost to prox LCX, 40% mid LAD and 95% mid to dist RCA s/p PCI with DES x 2 and stage PCI of the LCx/OM.    He is here today for followup and is doing well.  He has chronic DOE when he really exerts himself but not with normal activities.  He denies any chest pain or pressure,  PND, orthopnea, LE edema, dizziness, palpitations or syncope. He is compliant with his meds and is tolerating meds with Wade SE.    Prior CV studies:   The following studies were reviewed today:  Labs from PCP  Past Medical History:  Diagnosis Date   AKI (acute kidney injury) ([place]) 05/27/2020   Arthritis    "joints get stiff" (06/11/2016)   Bee sting allergy    Bee sting allergy    CAD (coronary artery disease), native coronary artery 02/12/2016   cath showing 50% LM, 40% distal LAD, 70% ost to prox LCs, 40% mid LAD and 95% mid to dist RCA s/p PCI with DES x 2 and stage PCI of the LCx/OM.   Carotid artery stenosis    1-39% bilateral by dopplers 08/2017   Duodenal ulcer    History of gout    Hx of adenomatous colonic polyps 04/10/2008   Hyperlipidemia    Hypertension    Hypothyroidism    OSA (obstructive sleep apnea)    "tried CPAP; couldn't do it" (06/11/2016)   Persistent atrial fibrillation ([place])    s/p DCCV 03/2015 with reversion back to atrial fibrillation, loaded with Amio and s/p DCCV to NSR 11/2015.  He is now s/p  afib albation.  He had amio lung toxicity and amio was stopped.   Thyroid disease    Type II diabetes mellitus ([place])    Vitamin D deficiency    Past Surgical History:  Procedure Laterality Date   BIOPSY  05/28/2020   Procedure: BIOPSY;  Surgeon: Jerene Bears, MD;  [facility] ENDOSCOPY;  Service: Gastroenterology;;   CARDIAC CATHETERIZATION N/A 10/11/2015   Procedure: Left Heart Cath and Coronary Angiography;  Surgeon: [name], MD;  Location: [place] CV LAB;  Service: Cardiovascular;  Laterality: N/A;   CARDIAC CATHETERIZATION N/A 10/11/2015   Procedure: Coronary Stent Intervention;  Surgeon: [name], MD;  Location: [place] CV LAB;  Service: Cardiovascular;  Laterality: N/A;   CARDIAC CATHETERIZATION N/A 10/11/2015   Procedure: Intravascular Pressure Wire/FFR Study;  Surgeon: [name], MD;  Location: [place] CV LAB;  Service: Cardiovascular;  Laterality: N/A;   CARDIAC CATHETERIZATION N/A 10/14/2015   Procedure: Coronary Stent Intervention;  Surgeon: [name], MD;  Location: [place] CV LAB;  Service: Cardiovascular;  Laterality: N/A;   CARDIOVERSION N/A 04/05/2015   Procedure: CARDIOVERSION;  Surgeon: [name], MD;  Location: [place];  Service:  Cardiovascular;  Laterality: N/A;   CARDIOVERSION N/A 12/09/2015   Procedure: CARDIOVERSION;  Surgeon: [name], MD;  Location: [name] ENDOSCOPY;  Service: Cardiovascular;  Laterality: N/A;   CARDIOVERSION N/A 03/16/2016   Procedure: CARDIOVERSION;  Surgeon: [name], MD;  Location: [place];  Service: Cardiovascular;  Laterality: N/A;   CATARACT EXTRACTION Right 2019   Dr. Patrici Ranks   CORONARY ANGIOPLASTY     ELECTROPHYSIOLOGIC STUDY N/A 06/11/2016   Procedure: Atrial Fibrillation Ablation;  Surgeon: Will [name], MD;  Location: [place] CV LAB;  Service: Cardiovascular;  Laterality: N/A;   ESOPHAGOGASTRODUODENOSCOPY (EGD) WITH PROPOFOL N/A 05/28/2020   Procedure:  ESOPHAGOGASTRODUODENOSCOPY (EGD) WITH PROPOFOL;  Surgeon: Jerene Bears, MD;  Location: [place];  Service: Gastroenterology;  Laterality: N/A;   TOTAL HIP ARTHROPLASTY Bilateral 2001   UVULECTOMY       Current Meds  Medication Sig   acetaminophen (TYLENOL) 325 MG tablet Take 325 mg by mouth as needed for mild pain.   allopurinol (ZYLOPRIM) 300 MG tablet TAKE 1 TABLET BY MOUTH DAILY FOR GOUT PREVENTION   amLODipine (NORVASC) 10 MG tablet Take 1 tab  Daily For BP & Heart   Ascorbic Acid (VITAMIN C WITH ROSE HIPS) 500 MG tablet Take 500 mg by mouth daily.   atorvastatin (LIPITOR) 80 MG tablet Take 0.5 tablets (40 mg total) by mouth daily. Please keep schedule appointment for future refills. Thank you.   B Complex Vitamins (B COMPLEX PO) Take 1 tablet by mouth daily.   carvedilol (COREG) 12.5 MG tablet Take 1 tablet (12.5 mg total) by mouth 2 (two) times daily with a meal.   cholecalciferol (VITAMIN D) 1000 UNITS tablet Take 2,000 Units by mouth daily.   Ferrous Sulfate (IRON) 325 (65 Fe) MG TABS Take 1 tablet (325 mg total) by mouth every other day.   furosemide (LASIX) 40 MG tablet TAKE 1 TABLET BY MOUTH 1 TO 2 TIMES PER DAY AS NEEDED FOR FLUID RETENTION OR ANKLE SWELLING   levothyroxine (SYNTHROID) 100 MCG tablet Take  1 tablet  Daily  on an empty stomach with only water for 30 minutes & Wade Antacid meds, Calcium or Magnesium for 4 hours & avoid Biotin                                                                                 /                                         TAKE                                                     BY  MOUTH   loratadine (CLARITIN) 10 MG tablet Take 10 mg by mouth daily.   magnesium gluconate (MAGONATE) 500 MG tablet Take '500mg'$  TID and with meals.   metFORMIN (GLUCOPHAGE-XR) 500 MG 24 hr tablet Take two tablets daily with meals for Diabetes   olmesartan (BENICAR) 40 MG tablet TAKE 1 TABLET BY MOUTH DAILY FOR BLOOD  PRESSURE. Please keep schedule appointment for future refills. Thank you.   Omega-3 Fatty Acids (FISH OIL) 1200 MG CAPS Take 1,200 mg by mouth daily.   pantoprazole (PROTONIX) 40 MG tablet TAKE 1 TABLET BY MOUTH EVERY DAY BEFORE BREAKFAST   warfarin (COUMADIN) 5 MG tablet Take 1 tablet daily except 1 1/2 tablets on Mondays, Wednesdays and Fridays or as directed. (Patient taking differently: Take 1 tablet daily except 1 1/2 tablets on Mondays)   zinc gluconate 50 MG tablet Take 50 mg by mouth daily.     Allergies:   Amiodarone and Bee venom   Social History   Tobacco Use   Smoking status: Never   Smokeless tobacco: Never  Vaping Use   Vaping Use: Never used  Substance Use Topics   Alcohol use: Wade   Drug use: Wade     Family Hx: The patient's family history includes Colon cancer in his sister; Liver disease in his brother, father, mother, and sister; Lung cancer in his father and mother; Prostate cancer in his brother. There is Wade history of Stomach cancer, Pancreatic cancer, or Esophageal cancer.  ROS:   Please see the history of present illness.     All other systems reviewed and are negative.   Labs/Other Tests and Data Reviewed:    Recent Labs: 08/14/2021: NT-Pro BNP 238 11/19/2021: Magnesium 1.8 05/28/2022: ALT 21; BUN 13; Creat 1.03; Hemoglobin 14.1; Platelets 258; Potassium 3.5; Sodium 143; TSH 2.27   Recent Lipid Panel Lab Results  Component Value Date/Time   CHOL 123 05/28/2022 03:12 PM   TRIG 185 (H) 05/28/2022 03:12 PM   HDL 40 05/28/2022 03:12 PM   CHOLHDL 3.1 05/28/2022 03:12 PM   LDLCALC 57 05/28/2022 03:12 PM    Wt Readings from Last 3 Encounters:  06/15/22 243 lb 9.6 oz (110.5 kg)  05/28/22 246 lb 6.4 oz (111.8 kg)  03/05/22 249 lb 12.8 oz (113.3 kg)     Objective:    Vital Signs:  BP (!) 146/70   Pulse 66   Ht '5\' 11"'$  (1.803 m)   Wt 243 lb 9.6 oz (110.5 kg)   SpO2 93%   BMI 33.98 kg/m   GEN: Well nourished, well developed in Wade acute  distress HEENT: Normal NECK: Wade JVD; Wade carotid bruits LYMPHATICS: Wade lymphadenopathy CARDIAC:RRR, Wade murmurs, rubs, gallops RESPIRATORY:  Clear to auscultation without rales, wheezing or rhonchi  ABDOMEN: Soft, non-tender, non-distended MUSCULOSKELETAL:  Wade edema; Wade deformity  SKIN: Warm and dry NEUROLOGIC:  Alert and oriented x 3 PSYCHIATRIC:  Normal affect  ASSESSMENT & PLAN:    1.  ASCAD  -cath 2017 showed 50% LM, 40% distal LAD, 70% ost to prox LCX, 40% mid LAD and 95% mid to dist RCA s/p PCI with DES x 2 and stage PCI of the LCx/OM.    -Nuclear stress test 04/2021 showed Wade ischemia -He has not had any further anginal pain -Continue prescription drug management 25 mg twice daily and high-dose statin therapy with as needed refills-Wade ASA due to warfarin  2.  HTN  -BP is borderline controlled on exam today>>at home 132/42mHg -Continue prescription drug management  with amlodipine 10 mg daily, Carvedilol 12.'5mg'$  BID, olmesartan 40 mg daily with as needed refills -I have personally reviewed and interpreted outside labs performed by patient's PCP which showed serum creatinine 1.03 and potassium 3.  5 on 05/28/2022   3.  Persistent atrial fibrillation  -he is status post [name] ablation in 2018  -amiodarone stopped due to possible lung toxicity.  -He remains in normal sinus rhythm and denies any palpitations -Continue prescription drug management Carvedilol  12.5 mg twice daily and warfarin therapy -I have personally reviewed and interpreted outside labs performed by patient's PCP which showed hemoglobin 14.1 on 05/28/2022   4.  Bilateral carotid artery stenosis  -Dopplers 03/2020 showed 1 to 39% bilateral stenosis.   -repeat Doppler -continue statin  5.  Hyperlipidemia  -LDL goal is less than 70.   -I have personally reviewed and interpreted outside labs performed by patient's PCP which showed LDL 57 and HDL 40 on 05/28/2022 as well as normal ALT at 21 -Continue prescription drug  management with Atorvastatin 40 mg daily with as needed refills  Medication Adjustments/Labs and Tests Ordered: Current medicines are reviewed at length with the patient today.  Concerns regarding medicines are outlined above.  Tests Ordered: Wade orders of the defined types were placed in this encounter.   Medication Changes: Wade orders of the defined types were placed in this encounter.    Disposition:  Follow up in 1 year(s)  Signed, Fransico Him, MD  06/15/2022 11:37 AM    [facility]

## 2022-06-15 NOTE — Patient Instructions (Signed)
Medication Instructions:  Your physician recommends that you continue on your current medications as directed. Please refer to the Current Medication list given to you today.  *If you need a refill on your cardiac medications before your next appointment, please call your pharmacy*   Lab Work: None.  If you have labs (blood work) drawn today and your tests are completely normal, you will receive your results only by: [place] (if you have MyChart) OR A paper copy in the mail If you have any lab test that is abnormal or we need to change your treatment, we will call you to review the results.   Testing/Procedures: Your physician has requested that you have a carotid duplex. This test is an ultrasound of the carotid arteries in your neck. It looks at blood flow through these arteries that supply the brain with blood. Allow one hour for this exam. There are no restrictions or special instructions.    Follow-Up: At [facility], you and your health needs are our priority.  As part of our continuing mission to provide you with exceptional heart care, we have created designated Provider Care Teams.  These Care Teams include your primary Cardiologist (physician) and Advanced Practice Providers (APPs -  Physician Assistants and Nurse Practitioners) who all work together to provide you with the care you need, when you need it.  We recommend signing up for the patient portal called "MyChart".  Sign up information is provided on this After Visit Summary.  MyChart is used to connect with patients for Virtual Visits (Telemedicine).  Patients are able to view lab/test results, encounter notes, upcoming appointments, etc.  Non-urgent messages can be sent to your provider as well.   To learn more about what you can do with MyChart, go to [email or web address].    Your next appointment:   1 year(s)  Provider:   Fransico Him, MD

## 2022-06-25 ENCOUNTER — Other Ambulatory Visit: Payer: Self-pay | Admitting: Cardiology

## 2022-06-25 DIAGNOSIS — I4819 Other persistent atrial fibrillation: Secondary | ICD-10-CM

## 2022-06-25 NOTE — Telephone Encounter (Signed)
Warfarin refill  Last INR 05/26/22 Last OV 06/15/22

## 2022-07-07 ENCOUNTER — Ambulatory Visit (INDEPENDENT_AMBULATORY_CARE_PROVIDER_SITE_OTHER): Payer: Medicare Other | Admitting: *Deleted

## 2022-07-07 ENCOUNTER — Ambulatory Visit (HOSPITAL_COMMUNITY)
Admission: RE | Admit: 2022-07-07 | Discharge: 2022-07-07 | Disposition: A | Discharge status: Home / Self Care | Payer: Medicare Other | Source: Ambulatory Visit | Attending: Cardiology | Admitting: Cardiology

## 2022-07-07 DIAGNOSIS — Z5181 Encounter for therapeutic drug level monitoring: Secondary | ICD-10-CM | POA: Insufficient documentation

## 2022-07-07 DIAGNOSIS — I6523 Occlusion and stenosis of bilateral carotid arteries: Secondary | ICD-10-CM | POA: Insufficient documentation

## 2022-07-07 DIAGNOSIS — I4819 Other persistent atrial fibrillation: Secondary | ICD-10-CM | POA: Diagnosis not present

## 2022-07-07 LAB — POCT INR: INR: 2.2 (ref 2.0–3.0)

## 2022-07-07 NOTE — Patient Instructions (Signed)
Description   Continue taking warfarin 1 tablet daily except 1.5 tablets on Mondays. Stay consistent with greens each week (2-3 times per week). Recheck INR in 6 weeks.  Coumadin Clinic:[phone] Fax number [phone]

## 2022-07-08 ENCOUNTER — Encounter: Payer: Self-pay | Admitting: Cardiology

## 2022-07-08 DIAGNOSIS — I771 Stricture of artery: Secondary | ICD-10-CM | POA: Insufficient documentation

## 2022-07-23 ENCOUNTER — Other Ambulatory Visit: Payer: Self-pay | Admitting: Nurse Practitioner

## 2022-07-23 DIAGNOSIS — M1A9XX0 Chronic gout, unspecified, without tophus (tophi): Secondary | ICD-10-CM

## 2022-08-02 NOTE — Progress Notes (Unsigned)
Cardiology Office Note Date:  08/04/2022  Patient ID:  Christopher Wade, Christopher Wade 1951-04-04, MRN [id number] PCP:  [name], MD  Cardiologist:  [name] Electrophysiologist: Dr. Curt Bears    Chief Complaint:  annual visit  History of Present Illness: Christopher Wade is a 72 y.o. male with history of CAD (PCI to LCx/OM 2017), HTN, HLD, DM, AFib, hypothyroidism, OSA intolerant of CPAP  He saw [name] 04/10/21, described chronic DOE with extreme exertion, c/o CP that was felt to be atypical sounding, planned for stress test  Stress was neg for ischemia   He saw Dr. Curt Bears, maintaining SR, c/o DOE, urged CPAP compliance, planned to check BNP, no changes were made.  Saw [name] 06/15/22, again DOE though only with heavy exertion, no changes were made.  TODAY He is doing well No CP, palpitations or cardiac awareness. No SOB with ADLs, only with heavier activities, not changing or escalating No near syncope or syncope.  He will in the hot weather on occasion when out in the heat for a while get a little lightheaded. No bleeding or signs of bleeding   AFib/AAD hx PVI ablation 2016 and Jan 2018 Amiodarone started 2017 Stopped 2/2 pulmonary toxicity Feb 2016   Past Medical History:  Diagnosis Date   AKI (acute kidney injury) ([place]) 05/27/2020   Arthritis    "joints get stiff" (06/11/2016)   Bee sting allergy    Bee sting allergy    CAD (coronary artery disease), native coronary artery 02/12/2016   cath showing 50% LM, 40% distal LAD, 70% ost to prox LCs, 40% mid LAD and 95% mid to dist RCA s/p PCI with DES x 2 and stage PCI of the LCx/OM.   Carotid artery stenosis    1-39% bilateral carotid stenosis by dopples 06/2022   Duodenal ulcer    History of gout    Hx of adenomatous colonic polyps 04/10/2008   Hyperlipidemia    Hypertension    Hypothyroidism    OSA (obstructive sleep apnea)    "tried CPAP; couldn't do it" (06/11/2016)   Persistent atrial fibrillation ([place])     s/p DCCV 03/2015 with reversion back to atrial fibrillation, loaded with Amio and s/p DCCV to NSR 11/2015.  He is now s/p afib albation.  He had amio lung toxicity and amio was stopped.   Subclavian artery stenosis ([facility])    right subclavian artery stenosis by dopplers 2/24   Thyroid disease    Type II diabetes mellitus ([place])    Vitamin D deficiency     Past Surgical History:  Procedure Laterality Date   BIOPSY  05/28/2020   Procedure: BIOPSY;  Surgeon: Jerene Bears, MD;  Location: [facility] ENDOSCOPY;  Service: Gastroenterology;;   CARDIAC CATHETERIZATION N/A 10/11/2015   Procedure: Left Heart Cath and Coronary Angiography;  Surgeon: [name], MD;  Location: [place] CV LAB;  Service: Cardiovascular;  Laterality: N/A;   CARDIAC CATHETERIZATION N/A 10/11/2015   Procedure: Coronary Stent Intervention;  Surgeon: [name], MD;  Location: [place] CV LAB;  Service: Cardiovascular;  Laterality: N/A;   CARDIAC CATHETERIZATION N/A 10/11/2015   Procedure: Intravascular Pressure Wire/FFR Study;  Surgeon: [name], MD;  Location: [place] CV LAB;  Service: Cardiovascular;  Laterality: N/A;   CARDIAC CATHETERIZATION N/A 10/14/2015   Procedure: Coronary Stent Intervention;  Surgeon: [name], MD;  Location: [place] CV LAB;  Service: Cardiovascular;  Laterality: N/A;   CARDIOVERSION N/A 04/05/2015  Procedure: CARDIOVERSION;  Surgeon: [name], MD;  Location: [facility] ENDOSCOPY;  Service: Cardiovascular;  Laterality: N/A;   CARDIOVERSION N/A 12/09/2015   Procedure: CARDIOVERSION;  Surgeon: [name], MD;  Location: [place] ENDOSCOPY;  Service: Cardiovascular;  Laterality: N/A;   CARDIOVERSION N/A 03/16/2016   Procedure: CARDIOVERSION;  Surgeon: [name], MD;  Location: [place];  Service: Cardiovascular;  Laterality: N/A;   CATARACT EXTRACTION Right 2019   Dr. Patrici Ranks   CORONARY ANGIOPLASTY     ELECTROPHYSIOLOGIC STUDY N/A 06/11/2016   Procedure: Atrial  Fibrillation Ablation;  Surgeon: Will [name], MD;  Location: [place] CV LAB;  Service: Cardiovascular;  Laterality: N/A;   ESOPHAGOGASTRODUODENOSCOPY (EGD) WITH PROPOFOL N/A 05/28/2020   Procedure: ESOPHAGOGASTRODUODENOSCOPY (EGD) WITH PROPOFOL;  Surgeon: Jerene Bears, MD;  Location: [place];  Service: Gastroenterology;  Laterality: N/A;   TOTAL HIP ARTHROPLASTY Bilateral 2001   UVULECTOMY      Current Outpatient Medications  Medication Sig Dispense Refill   acetaminophen (TYLENOL) 325 MG tablet Take 325 mg by mouth as needed for mild pain.     allopurinol (ZYLOPRIM) 300 MG tablet TAKE 1 TABLET BY MOUTH DAILY FOR GOUT PREVENTION 90 tablet 1   amLODipine (NORVASC) 10 MG tablet Take 1 tab  Daily For BP & Heart 90 tablet 3   Ascorbic Acid (VITAMIN C WITH ROSE HIPS) 500 MG tablet Take 500 mg by mouth daily.     atorvastatin (LIPITOR) 80 MG tablet Take 0.5 tablets (40 mg total) by mouth daily. Please keep schedule appointment for future refills. Thank you. 45 tablet 0   B Complex Vitamins (B COMPLEX PO) Take 1 tablet by mouth daily.     carvedilol (COREG) 12.5 MG tablet Take 1 tablet (12.5 mg total) by mouth 2 (two) times daily with a meal. 180 tablet 0   cholecalciferol (VITAMIN D) 1000 UNITS tablet Take 2,000 Units by mouth daily.     Ferrous Sulfate (IRON) 325 (65 Fe) MG TABS Take 1 tablet (325 mg total) by mouth every other day. 30 tablet 0   furosemide (LASIX) 40 MG tablet TAKE 1 TABLET BY MOUTH 1 TO 2 TIMES PER DAY AS NEEDED FOR FLUID RETENTION OR ANKLE SWELLING 180 tablet 1   levothyroxine (SYNTHROID) 100 MCG tablet Take  1 tablet  Daily  on an empty stomach with only water for 30 minutes & no Antacid meds, Calcium or Magnesium for 4 hours & avoid Biotin                                                                                 /                                         TAKE                                                     BY  MOUTH 90 tablet 3   loratadine (CLARITIN) 10 MG tablet Take 10 mg by mouth daily.     magnesium gluconate (MAGONATE) 500 MG tablet Take '500mg'$  TID and with meals. 30 tablet 0   metFORMIN (GLUCOPHAGE-XR) 500 MG 24 hr tablet Take two tablets daily with meals for Diabetes 360 tablet 1   olmesartan (BENICAR) 40 MG tablet TAKE 1 TABLET BY MOUTH DAILY FOR BLOOD PRESSURE. Please keep schedule appointment for future refills. Thank you. 90 tablet 0   Omega-3 Fatty Acids (FISH OIL) 1200 MG CAPS Take 1,200 mg by mouth daily.     pantoprazole (PROTONIX) 40 MG tablet TAKE 1 TABLET BY MOUTH EVERY DAY BEFORE BREAKFAST 180 tablet 0   warfarin (COUMADIN) 5 MG tablet TAKE 1 TABLET BY MOUTH DAILY. EXCEPT 1 1/2 TABLETS ON MONDAYS OR AS DIRECTED BY ANTICOAGULATION CLINIC. 40 tablet 3   zinc gluconate 50 MG tablet Take 50 mg by mouth daily.     No current facility-administered medications for this visit.    Allergies:   Amiodarone and Bee venom   Social History:  The patient  reports that he has never smoked. He has never used smokeless tobacco. He reports that he does not drink alcohol and does not use drugs.   Family History:  The patient's family history includes Colon cancer in his sister; Liver disease in his brother, father, mother, and sister; Lung cancer in his father and mother; Prostate cancer in his brother.  ROS:  Please see the history of present illness.    All other systems are reviewed and otherwise negative.   PHYSICAL EXAM:  VS:  BP 120/70   Pulse 68   Ht '5\' 11"'$  (1.803 m)   Wt 237 lb 12.8 oz (107.9 kg)   SpO2 97%   BMI 33.17 kg/m  BMI: Body mass index is 33.17 kg/m. Well nourished, well developed, in no acute distress HEENT: normocephalic, atraumatic Neck: no JVD, carotid bruits or masses Cardiac:  RRR; no significant murmurs, no rubs, or gallops Lungs:  CTA b/l, no wheezing, rhonchi or rales Abd: soft, nontender MS: no deformity or atrophy Ext: no edema Skin: warm and dry, no  rash Neuro:  No gross deficits appreciated Psych: euthymic mood, full affect   EKG:  done today and reviewed by myself SR 68bpm, Q III, unchanged   04/24/21: stress myoview   The study is normal. The study is low risk.   No ST deviation was noted.   Left ventricular function is normal. Nuclear stress EF: 67 %. The left ventricular ejection fraction is hyperdynamic (>65%). End diastolic cavity size is normal.   Prior study available for comparison from 07/02/2016.   05/27/2020: TTE 1. Left ventricular ejection fraction, by estimation, is 55 to 60%. The  left ventricle has normal function. The left ventricle has no regional  wall motion abnormalities. There is moderate concentric left ventricular  hypertrophy. Left ventricular  diastolic parameters were normal.   2. Right ventricular systolic function is normal. The right ventricular  size is normal. There is normal pulmonary artery systolic pressure.   3. Left atrial size was mildly dilated.   4. The mitral valve is normal in structure. No evidence of mitral valve  regurgitation. No evidence of mitral stenosis.   5. The aortic valve is tricuspid. Aortic valve regurgitation is not  visualized. No aortic stenosis is present.   6. The inferior vena cava is normal in size with greater than 50%  respiratory variability, suggesting right atrial  pressure of 3 mmHg.   06/11/2016: EPS/ablation CONCLUSIONS: 1. Sinus rhythm upon presentation.   2. Successful electrical isolation and anatomical encircling of all four pulmonary veins with radiofrequency current. 3. No inducible arrhythmias following ablation both on and off of Isuprel 4. No early apparent complications.  Recent Labs: 08/14/2021: NT-Pro BNP 238 11/19/2021: Magnesium 1.8 05/28/2022: ALT 21; BUN 13; Creat 1.03; Hemoglobin 14.1; Platelets 258; Potassium 3.5; Sodium 143; TSH 2.27  05/28/2022: Cholesterol 123; HDL 40; LDL Cholesterol (Calc) 57; Total CHOL/HDL Ratio 3.1; Triglycerides 185    CrCl cannot be calculated (Patient's most recent lab result is older than the maximum 21 days allowed.).   Wt Readings from Last 3 Encounters:  08/04/22 237 lb 12.8 oz (107.9 kg)  06/15/22 243 lb 9.6 oz (110.5 kg)  05/28/22 246 lb 6.4 oz (111.8 kg)     Other studies reviewed: Additional studies/records reviewed today include: summarized above  ASSESSMENT AND PLAN:  Persistent AFib CHA2DS2Vasc is 4, on warfarin No symptoms of AF  CAD No CP, anginal symptoms On statin, BB, no ASA w/warfarin C/w [name]  3.   HTN Looks good  4. Secondary hypercoagulable state    Disposition: F/u with EP again in a year, sooner if needed  Current medicines are reviewed at length with the patient today.  The patient did not have any concerns regarding medicines.  Venetia Night, PA-C 08/04/2022 2:01 PM     [place] [address] [phone] (office)  [phone] (fax)

## 2022-08-04 ENCOUNTER — Ambulatory Visit: Payer: Medicare Other | Attending: Physician Assistant | Admitting: Physician Assistant

## 2022-08-04 ENCOUNTER — Encounter: Payer: Self-pay | Admitting: Physician Assistant

## 2022-08-04 VITALS — BP 120/70 | HR 68 | Ht 71.0 in | Wt 237.8 lb

## 2022-08-04 DIAGNOSIS — I10 Essential (primary) hypertension: Secondary | ICD-10-CM | POA: Diagnosis not present

## 2022-08-04 DIAGNOSIS — I2510 Atherosclerotic heart disease of native coronary artery without angina pectoris: Secondary | ICD-10-CM

## 2022-08-04 DIAGNOSIS — I4819 Other persistent atrial fibrillation: Secondary | ICD-10-CM

## 2022-08-04 DIAGNOSIS — D6869 Other thrombophilia: Secondary | ICD-10-CM

## 2022-08-04 NOTE — Patient Instructions (Signed)
Medication Instructions:    Your physician recommends that you continue on your current medications as directed. Please refer to the Current Medication list given to you today.   *If you need a refill on your cardiac medications before your next appointment, please call your pharmacy*   Lab Work:  NONE ORDERED  TODAY    If you have labs (blood work) drawn today and your tests are completely normal, you will receive your results only by: MyChart Message (if you have MyChart) OR A paper copy in the mail If you have any lab test that is abnormal or we need to change your treatment, we will call you to review the results.   Testing/Procedures: NONE ORDERED  TODAY    Follow-Up: At [facility], you and your health needs are our priority.  As part of our continuing mission to provide you with exceptional heart care, we have created designated Provider Care Teams.  These Care Teams include your primary Cardiologist (physician) and Advanced Practice Providers (APPs -  Physician Assistants and Nurse Practitioners) who all work together to provide you with the care you need, when you need it.  We recommend signing up for the patient portal called "MyChart".  Sign up information is provided on this After Visit Summary.  MyChart is used to connect with patients for Virtual Visits (Telemedicine).  Patients are able to view lab/test results, encounter notes, upcoming appointments, etc.  Non-urgent messages can be sent to your provider as well.   To learn more about what you can do with MyChart, go to [email or web address].    Your next appointment:   1 year(s)  Provider:   You may see Will Martin Camnitz, MD or one of the following Advanced Practice Providers on your designated Care Team:   Renee Ursuy, PA-C Michael "Andy" Tillery, PA-C Suzann Riddle, NP     Other Instructions  

## 2022-08-18 ENCOUNTER — Ambulatory Visit: Payer: Medicare Other | Attending: Cardiology | Admitting: *Deleted

## 2022-08-18 DIAGNOSIS — I4819 Other persistent atrial fibrillation: Secondary | ICD-10-CM

## 2022-08-18 DIAGNOSIS — Z5181 Encounter for therapeutic drug level monitoring: Secondary | ICD-10-CM

## 2022-08-18 LAB — POCT INR: POC INR: 4

## 2022-08-18 NOTE — Patient Instructions (Signed)
Description   Hold warfarin today and then continue taking warfarin 1 tablet daily except 1.5 tablets on Mondays. Stay consistent with greens each week (2-3 times per week). Recheck INR in 3 weeks.  Coumadin Clinic:[phone] Fax number [phone]

## 2022-08-19 ENCOUNTER — Ambulatory Visit: Payer: Medicare Other | Admitting: Nurse Practitioner

## 2022-08-27 ENCOUNTER — Encounter: Payer: Self-pay | Admitting: Nurse Practitioner

## 2022-08-27 ENCOUNTER — Ambulatory Visit (INDEPENDENT_AMBULATORY_CARE_PROVIDER_SITE_OTHER): Payer: Medicare Other | Admitting: Nurse Practitioner

## 2022-08-27 VITALS — BP 128/72 | HR 60 | Temp 97.9°F | Ht 71.0 in | Wt 232.8 lb

## 2022-08-27 DIAGNOSIS — R609 Edema, unspecified: Secondary | ICD-10-CM

## 2022-08-27 DIAGNOSIS — E559 Vitamin D deficiency, unspecified: Secondary | ICD-10-CM

## 2022-08-27 DIAGNOSIS — I4819 Other persistent atrial fibrillation: Secondary | ICD-10-CM

## 2022-08-27 DIAGNOSIS — R6889 Other general symptoms and signs: Secondary | ICD-10-CM | POA: Diagnosis not present

## 2022-08-27 DIAGNOSIS — I10 Essential (primary) hypertension: Secondary | ICD-10-CM | POA: Diagnosis not present

## 2022-08-27 DIAGNOSIS — M1A9XX0 Chronic gout, unspecified, without tophus (tophi): Secondary | ICD-10-CM

## 2022-08-27 DIAGNOSIS — Z79899 Other long term (current) drug therapy: Secondary | ICD-10-CM

## 2022-08-27 DIAGNOSIS — E1122 Type 2 diabetes mellitus with diabetic chronic kidney disease: Secondary | ICD-10-CM | POA: Diagnosis not present

## 2022-08-27 DIAGNOSIS — E1169 Type 2 diabetes mellitus with other specified complication: Secondary | ICD-10-CM

## 2022-08-27 DIAGNOSIS — G4733 Obstructive sleep apnea (adult) (pediatric): Secondary | ICD-10-CM

## 2022-08-27 DIAGNOSIS — E039 Hypothyroidism, unspecified: Secondary | ICD-10-CM

## 2022-08-27 DIAGNOSIS — Z0001 Encounter for general adult medical examination with abnormal findings: Secondary | ICD-10-CM

## 2022-08-27 DIAGNOSIS — Z8719 Personal history of other diseases of the digestive system: Secondary | ICD-10-CM

## 2022-08-27 DIAGNOSIS — Z860101 Personal history of adenomatous and serrated colon polyps: Secondary | ICD-10-CM

## 2022-08-27 DIAGNOSIS — E785 Hyperlipidemia, unspecified: Secondary | ICD-10-CM | POA: Diagnosis not present

## 2022-08-27 DIAGNOSIS — I2510 Atherosclerotic heart disease of native coronary artery without angina pectoris: Secondary | ICD-10-CM

## 2022-08-27 DIAGNOSIS — R1013 Epigastric pain: Secondary | ICD-10-CM

## 2022-08-27 DIAGNOSIS — J849 Interstitial pulmonary disease, unspecified: Secondary | ICD-10-CM

## 2022-08-27 DIAGNOSIS — Z86010 Personal history of colonic polyps: Secondary | ICD-10-CM

## 2022-08-27 DIAGNOSIS — K2270 Barrett's esophagus without dysplasia: Secondary | ICD-10-CM

## 2022-08-27 DIAGNOSIS — K921 Melena: Secondary | ICD-10-CM

## 2022-08-27 DIAGNOSIS — Z0000 Encounter for general adult medical examination without abnormal findings: Secondary | ICD-10-CM

## 2022-08-27 MED ORDER — FUROSEMIDE 40 MG PO TABS: ORAL_TABLET | ORAL | 1 refills | Status: DC

## 2022-08-27 MED ORDER — LEVOTHYROXINE SODIUM 100 MCG PO TABS: ORAL_TABLET | ORAL | 3 refills | Status: DC

## 2022-08-27 MED ORDER — AMLODIPINE BESYLATE 10 MG PO TABS: ORAL_TABLET | ORAL | 3 refills | Status: DC

## 2022-08-27 MED ORDER — ATORVASTATIN CALCIUM 80 MG PO TABS: 40.0000 mg | ORAL_TABLET | Freq: Every day | ORAL | 0 refills | Status: DC

## 2022-08-27 MED ORDER — CARVEDILOL 12.5 MG PO TABS: 12.5000 mg | ORAL_TABLET | Freq: Two times a day (BID) | ORAL | 0 refills | Status: DC

## 2022-08-27 MED ORDER — PANTOPRAZOLE SODIUM 40 MG PO TBEC: DELAYED_RELEASE_TABLET | ORAL | 0 refills | Status: DC

## 2022-08-27 MED ORDER — METFORMIN HCL ER 500 MG PO TB24: ORAL_TABLET | ORAL | 1 refills | Status: DC

## 2022-08-27 MED ORDER — OLMESARTAN MEDOXOMIL 40 MG PO TABS: ORAL_TABLET | ORAL | 0 refills | Status: DC

## 2022-08-27 NOTE — Progress Notes (Signed)
MEDICARE ANNUAL WELLNESS VISIT AND 3 MONTH FOLLOW UP  Assessment:   Christopher Wade was seen today for medicare wellness.  Diagnoses and all orders for this visit:  Annual Medicare Wellness Visit Due annually  Health maintenance reviewed  Essential hypertension Discussed DASH (Dietary Approaches to Stop Hypertension) DASH diet is lower in sodium than a typical American diet. Cut back on foods that are high in saturated fat, cholesterol, and trans fats. Eat more whole-grain foods, fish, poultry, and nuts Remain active and exercise as tolerated daily.  Monitor BP at home-Call if greater than 130/80.  Check CMP/CBC  Edema Elevate BLE Suggest compression stockings Monitor fluid intake, salt intake Any gain of +3 lb daily contact office.  Hyperlipidemia associated with type 2 diabetes mellitus (HCC) Discussed lifestyle modifications. Recommended diet heavy in fruits and veggies, omega 3's. Decrease consumption of animal meats, cheeses, and dairy products. Remain active and exercise as tolerated. Continue to monitor. Check lipids/TSH  Persistent atrial fibrillation [facility]) Doing well s/p ablation off AAD therapy Continue coumadin via cardiology for [facility] Continue following up with cardio/ coumadin clinic   CKD stage 2 due to type 2 diabetes mellitus (HCC) Discussed how what you eat and drink can aide in kidney protection. Stay well hydrated. Avoid high salt foods. Avoid NSAIDS. Keep BP and BG well controlled.   Take medications as prescribed. Remain active and exercise as tolerated daily. Maintain weight.  Continue to monitor. Check CMP/GFR/Microablumin  Type 2 diabetes mellitus with stage 2 chronic kidney disease, without long-term current use of insulin [facility]) Education: Reviewed 'ABCs' of diabetes management  Discussed goals to be met and/or maintained include A1C (<7) Blood pressure (<130/80) Cholesterol (LDL <70) Continue Eye Exam yearly  Continue Dental Exam Q6  mo Discussed dietary recommendations Discussed Physical Activity recommendations Check A1C  Chronic gout without tophus, unspecified cause, unspecified site Continue Allopurinol  daily No recent flares Diet discussed including avoid high purine foods Continue to monitor  OSA (obstructive sleep apnea) Was following neuro, tolerated CPAP poorly, adamantly declined further follow up/referral back. Discussed cardiac/memory risks. Continues to decline for now.  Discussed benefit of weight loss  Coronary artery disease involving native coronary artery of native heart without angina pectoris Discussed dietary and exercise modifications Blood pressure and cholesterol control Per CT 01/29/21  ILD Doing well Continue to monitor No maintenance medication  Morbid obesity (HCC) -  BMI 30+ with OSA Discussed appropriate BMI Diet modification. Physical activity. Encouraged/praised to build confidence.  Hypothyroidism, Controlled. Continue Levothyroxine. Reminded to take on an empty stomach 30-94mins before food.  Stop any Biotin Supplement 48-72 hours before next TSH level to reduce the risk of falsely low TSH levels. Continue to monitor.    Vitamin D deficiency Continue supplement for goal of 60-100 Monitor Vitamin D levels  Medication management All medications discussed and reviewed in full. All questions and concerns regarding medications addressed.    Barrett's esophagus Continue PPI daily, follow up EGD due 2025 per GI  History of colon polyps UTD; next due 09/2022 High fiber diet encouraged  Hx of GI Bleed/Epigastric pain/black tarry stools Monitor CBC Anemia panel if Hbg low Stool cards provided Continue Pantoprazole as directed  Orders Placed This Encounter  Procedures   CBC with Differential/Platelet   COMPLETE METABOLIC PANEL WITH GFR   Lipid panel   TSH   Hemoglobin A1c   VITAMIN D 25 Hydroxy (Vit-D Deficiency, Fractures)    Notify office for  further evaluation and treatment, questions or concerns if  any reported s/s fail to improve.   The patient was advised to call back or seek an in-person evaluation if any symptoms worsen or if the condition fails to improve as anticipated.   Further disposition pending results of labs. Discussed med's effects and SE's.    I discussed the assessment and treatment plan with the patient. The patient was provided an opportunity to ask questions and all were answered. The patient agreed with the plan and demonstrated an understanding of the instructions.  Discussed med's effects and SE's. Screening labs and tests as requested with regular follow-up as recommended.  I provided 40 minutes of face-to-face time during this encounter including counseling, chart review, and critical decision making was preformed.  Today's Plan of Care is based on a patient-centered health care approach known as shared decision making - the decisions, tests and treatments allow for patient preferences and values to be balanced with clinical evidence.     Future Appointments  Date Time Provider Department Center  09/08/2022  1:30 PM [facility] None  12/10/2022  3:00 PM [name], MD [facility] None     Plan:   During the course of the visit the patient was educated and counseled about appropriate screening and preventive services including:   Pneumococcal vaccine  Influenza vaccine Prevnar 13 Td vaccine Screening electrocardiogram Colorectal cancer screening Diabetes screening Glaucoma screening Nutrition counseling    Subjective:  Christopher Wade is a 72 y.o. male who presents for Medicare Annual Wellness Visit and 3 month follow up. 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  Overall he reports feeling well today.  He has no new concerns at this time.  Patient had upper GI bleed and hospitalized 05/26/20. With duodenal ulcer, gastritis and also found distal short segment Barrett's. Pathology showed acute and chronic inflammatory changes, neg H. Pylori, intestinal metaplasia without dysplasia or malignancy. Plans to repeat EGD in 3 years for Barrett's surveillance.   He has been taking pantoprazole 40 mg now daily decreased from BID to once daily and reports sx remain well controlled.   OSA recommended CPAP but patient declined, declines referral.   ILD/ dyspnea improved off of amiodarone, did see [name] in 2018, was stable/negative labs and no follow up since.   BMI is Body mass index is 32.47 kg/m., he has been working on diet and exercise.  Continues to lose weight.  Wt Readings from Last 3 Encounters:  08/27/22 232 lb 12.8 oz (105.6 kg)  08/04/22 237 lb 12.8 oz (107.9 kg)  06/15/22 243 lb 9.6 oz (110.5 kg)   He has aortic atherosclerosis per CT chest in 2018.  Patient has history of Afib (2016) and had CV x 2 (2016 & 2017) and then Jan 2018 had an Afib Ablation ([name]) without noted recurrence,  does remain on coumadin managed by coumadin clinic (couldn't afford xarelto) followed by [name].  His blood pressure  has been controlled at home, today their BP is BP: 128/72,    He denies chest pain, shortness of breath, dizziness.   He is on cholesterol medication (atorvastatin 80 mg) and denies myalgias. His cholesterol is at goal. The cholesterol last visit was:   Lab Results  Component Value Date   CHOL 136 08/27/2022   HDL 36 (L) 08/27/2022   LDLCALC 67 08/27/2022   TRIG 235 (H) 08/27/2022   CHOLHDL 3.8 08/27/2022   He has been working on diet and exercise for diabetes (metformin 1000 mg BID) with CKD II, and denies hyperglycemia, hypoglycemia , increased appetite, nausea, polydipsia, polyuria, visual disturbances and vomiting.  Fasting ranges 107-130.  Last A1C in the office was:  Lab Results  Component Value Date   HGBA1C 7.8 (H) 08/27/2022   Last GFR Lab Results  Component Value Date   EGFR 67 08/27/2022   Taking 100 mcg levothyroxine daily without recent dose change.  Lab Results  Component Value Date   TSH 2.85 08/27/2022   Patient is on Vitamin D supplement.   Lab Results  Component Value Date   VD25OH 69 08/27/2022       Medication Review:  Current Outpatient Medications (Endocrine & Metabolic):    levothyroxine (SYNTHROID) 100 MCG tablet, Take  1 tablet  Daily  on an empty stomach with only water for 30 minutes & no Antacid meds, Calcium or Magnesium for 4 hours & avoid Biotin                                                                                 /                                         TAKE                                                     BY                                                  MOUTH   metFORMIN (GLUCOPHAGE-XR) 500 MG 24 hr tablet, Take two tablets daily with meals for Diabetes  Current Outpatient Medications (Cardiovascular):    amLODipine (NORVASC) 10 MG tablet, Take 1 tab  Daily For BP & Heart   atorvastatin (LIPITOR) 80 MG tablet, Take 0.5 tablets (40 mg total) by mouth daily. Please keep schedule appointment for future refills. Thank you.    carvedilol (COREG) 12.5 MG tablet, Take 1 tablet (12.5 mg total) by mouth 2 (two) times daily with a meal.   furosemide (LASIX) 40 MG tablet, TAKE 1 TABLET BY MOUTH 1 TO 2 TIMES PER DAY  AS NEEDED FOR FLUID RETENTION OR ANKLE SWELLING   olmesartan (BENICAR) 40 MG tablet, TAKE 1 TABLET BY MOUTH DAILY FOR BLOOD PRESSURE. Please keep schedule appointment for future refills. Thank you.  Current Outpatient Medications (Respiratory):    loratadine (CLARITIN) 10 MG tablet, Take 10 mg by mouth daily.  Current Outpatient Medications (Analgesics):    acetaminophen (TYLENOL) 325 MG tablet, Take 325 mg by mouth as needed for mild pain.   allopurinol (ZYLOPRIM) 300 MG tablet, TAKE 1 TABLET BY MOUTH DAILY FOR GOUT PREVENTION  Current Outpatient Medications (Hematological):    Ferrous Sulfate (IRON) 325 (65 Fe) MG TABS, Take 1 tablet (325 mg total) by mouth every other day.   warfarin (COUMADIN) 5 MG tablet, TAKE 1 TABLET BY MOUTH DAILY. EXCEPT 1 1/2 TABLETS ON MONDAYS OR AS DIRECTED BY ANTICOAGULATION CLINIC.  Current Outpatient Medications (Other):    Ascorbic Acid (VITAMIN C WITH ROSE HIPS) 500 MG tablet, Take 500 mg by mouth daily.   B Complex Vitamins (B COMPLEX PO), Take 1 tablet by mouth daily.   cholecalciferol (VITAMIN D) 1000 UNITS tablet, Take 2,000 Units by mouth daily.   magnesium gluconate (MAGONATE) 500 MG tablet, Take 500mg  TID and with meals.   Omega-3 Fatty Acids (FISH OIL) 1200 MG CAPS, Take 1,200 mg by mouth daily.   zinc gluconate 50 MG tablet, Take 50 mg by mouth daily.   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   Screening Tests: Immunization History  Administered Date(s) Administered   Influenza Split 04/05/2014   Influenza, High Dose Seasonal PF 03/31/2018, 03/20/2019, 05/07/2020, 05/28/2021   Influenza-Unspecified 02/02/2013, 05/21/2016   PFIZER Comirnaty(Gray Top)Covid-19 Tri-Sucrose Vaccine 05/30/2021   PFIZER(Purple Top)SARS-COV-2 Vaccination 12/13/2019, 01/03/2020, 07/09/2020   Pneumococcal Conjugate-13 05/01/2014   Pneumococcal Polysaccharide-23 05/16/2015   Td 04/25/2010, 05/07/2020   Varicella Zoster Immune Globulin 02/22/2014   Health Maintenance  Topic Date Due   Zoster Vaccines- Shingrix (1 of 2) Never done   FOOT EXAM  11/06/2021   COVID-19 Vaccine (5 - 2023-24 season) 01/23/2022   COLONOSCOPY (Pts 45-77yrs Insurance coverage will need to be confirmed)  10/06/2022   Diabetic kidney evaluation - Urine ACR  11/20/2022   INFLUENZA VACCINE  12/24/2022   OPHTHALMOLOGY EXAM  01/21/2023   HEMOGLOBIN A1C  02/26/2023   Diabetic kidney evaluation - eGFR measurement  08/27/2023   Medicare Annual Wellness (AWV)  08/27/2023   DTaP/Tdap/Td (3 - Tdap) 05/07/2030   Pneumonia Vaccine 88+ Years old  Completed   Hepatitis C Screening  Completed  HPV VACCINES  Aged Out    Last colonoscopy: 2019, Positive Cologuard 07/2017 followed by colonoscopy 09/2017 with 5 year recall - due - has scheduled.   Names of Other Physician/Practitioners you currently use: 1. [facility] here for primary care 2. Dr. Dione Booze, Eye exam 01/14/2022, negative for retinopathy  3. Dentist, last 05/2021, goes q11m  Patient Care Team: [name], MD as PCP - General (Internal Medicine) Regan Lemming, MD as PCP - Electrophysiology (Cardiology) [name], MD as PCP - Cardiology (Cardiology) Iva Boop, MD as Consulting Physician (Gastroenterology) [name], MD as Consulting Physician (Cardiology) Charlett Nose, [facility] (Inactive) as Pharmacist (Pharmacist)  Surgical: He  has a past surgical history that includes Cardioversion (N/A, 04/05/2015); Cardioversion (N/A, 12/09/2015); Cardioversion (N/A, 03/16/2016); Cardiac catheterization (N/A, 06/11/2016); Total hip arthroplasty (Bilateral, 2001); Uvulectomy; Cardiac catheterization (N/A, 10/11/2015); Cardiac catheterization (N/A, 10/11/2015); Cardiac catheterization (N/A, 10/11/2015); Cardiac catheterization (N/A, 10/14/2015); Coronary angioplasty; Cataract extraction (Right, 2019); Esophagogastroduodenoscopy (egd) with propofol (N/A, 05/28/2020); and biopsy (05/28/2020). Family His family history includes Colon cancer in his sister; Liver disease in his brother, father, mother, and sister; Lung cancer in his father and mother; Prostate cancer in his brother. Social history  He reports that he has never smoked. He has never used smokeless tobacco. He reports that he does not drink alcohol and does not use drugs.  MEDICARE WELLNESS OBJECTIVES: Physical activity:   Cardiac risk factors:   Depression/mood screen:      08/30/2022    8:21 PM  Depression screen PHQ 2/9  Decreased Interest 0  Down, Depressed, Hopeless 0  PHQ - 2 Score 0    ADLs:     08/30/2022    8:20 PM  In your present  state of health, do you have any difficulty performing the following activities:  Hearing? 0  Vision? 0  Difficulty concentrating or making decisions? 0  Walking or climbing stairs? 0  Dressing or bathing? 0  Doing errands, shopping? 0  Preparing Food and eating ? N  Using the Toilet? N  In the past six months, have you accidently leaked urine? N  Do you have problems with loss of bowel control? N  Managing your Medications? N  Managing your Finances? N  Housekeeping or managing your Housekeeping? N     Cognitive Testing  Alert? Yes  Normal Appearance?Yes  Oriented to person? Yes  Place? Yes   Time? Yes  Recall of three objects?  Yes  Can perform simple calculations? Yes  Displays appropriate judgment?Yes  Can read the correct time from a watch face?Yes  EOL planning: Does Patient Have a Medical Advance Directive?: Yes Type of Advance Directive: Living will   Objective:   Today's Vitals   08/27/22 1424  BP: 128/72  Pulse: 60  Temp: 97.9 F (36.6 C)  SpO2: 97%  Weight: 232 lb 12.8 oz (105.6 kg)  Height: 5\' 11"  (1.803 m)    Body mass index is 32.47 kg/m.  General appearance: alert, no distress, WD/WN, male HEENT: normocephalic, sclerae anicteric, TMs pearly, nares patent, no discharge or erythema, pharynx normal Oral cavity: MMM, no lesions Neck: supple, no lymphadenopathy, no thyromegaly, no masses Heart: RRR, normal S1, S2, no murmurs Lungs: CTA bilaterally, no wheezes, rhonchi, or rales Abdomen: +bs, soft, non tender, non distended, no masses, no hepatomegaly, no splenomegaly Musculoskeletal: nontender, no swelling, no obvious deformity Extremities: no edema, no cyanosis, no clubbing Pulses: 2+ symmetric, upper and lower extremities, normal cap refill  Neurological: alert, oriented x 3, CN2-12 intact, strength normal upper extremities and lower extremities, sensation normal throughout, DTRs 2+ throughout, no cerebellar signs, gait normal Psychiatric: normal  affect, behavior normal, pleasant   Medicare Attestation I have personally reviewed: The patient's medical and social history Their use of alcohol, tobacco or illicit drugs Their current medications and supplements The patient's functional ability including ADLs,fall risks, home safety risks, cognitive, and hearing and visual impairment Diet and physical activities Evidence for depression or mood disorders  The patient's weight, height, BMI, and visual acuity have been recorded in the chart.  I have made referrals, counseling, and provided education to the patient based on review of the above and I have provided the patient with a written personalized care plan for preventive services.     [name], NP 8:21 PM [facility]

## 2022-08-28 LAB — COMPLETE METABOLIC PANEL WITH GFR
AG Ratio: 1.1 (calc) (ref 1.0–2.5)
ALT: 11 U/L (ref 9–46)
AST: 15 U/L (ref 10–35)
Albumin: 4.1 g/dL (ref 3.6–5.1)
Alkaline phosphatase (APISO): 89 U/L (ref 35–144)
BUN: 13 mg/dL (ref 7–25)
CO2: 31 mmol/L (ref 20–32)
Calcium: 9.1 mg/dL (ref 8.6–10.3)
Chloride: 97 mmol/L — ABNORMAL LOW (ref 98–110)
Creat: 1.16 mg/dL (ref 0.70–1.28)
Globulin: 3.6 g/dL (calc) (ref 1.9–3.7)
Glucose, Bld: 125 mg/dL — ABNORMAL HIGH (ref 65–99)
Potassium: 3.5 mmol/L (ref 3.5–5.3)
Sodium: 142 mmol/L (ref 135–146)
Total Bilirubin: 0.7 mg/dL (ref 0.2–1.2)
Total Protein: 7.7 g/dL (ref 6.1–8.1)
eGFR: 67 mL/min/{1.73_m2} (ref >=60)

## 2022-08-28 LAB — CBC WITH DIFFERENTIAL/PLATELET
Absolute Monocytes: 666 cells/uL (ref 200–950)
Basophils Absolute: 54 cells/uL (ref 0–200)
Basophils Relative: 0.6 %
Eosinophils Absolute: 450 cells/uL (ref 15–500)
Eosinophils Relative: 5 %
HCT: 44.5 % (ref 38.5–50.0)
Hemoglobin: 15.3 g/dL (ref 13.2–17.1)
Lymphs Abs: 1926 cells/uL (ref 850–3900)
MCH: 33 pg (ref 27.0–33.0)
MCHC: 34.4 g/dL (ref 32.0–36.0)
MCV: 95.9 fL (ref 80.0–100.0)
MPV: 10.2 fL (ref 7.5–12.5)
Monocytes Relative: 7.4 %
Neutro Abs: 5904 cells/uL (ref 1500–7800)
Neutrophils Relative %: 65.6 %
Platelets: 293 10*3/uL (ref 140–400)
RBC: 4.64 10*6/uL (ref 4.20–5.80)
RDW: 14.4 % (ref 11.0–15.0)
Total Lymphocyte: 21.4 %
WBC: 9 10*3/uL (ref 3.8–10.8)

## 2022-08-28 LAB — LIPID PANEL
Cholesterol: 136 mg/dL (ref <200)
HDL: 36 mg/dL — ABNORMAL LOW (ref >=40)
LDL Cholesterol (Calc): 67 mg/dL (calc)
Non-HDL Cholesterol (Calc): 100 mg/dL (calc) (ref <130)
Total CHOL/HDL Ratio: 3.8 (calc) (ref <5.0)
Triglycerides: 235 mg/dL — ABNORMAL HIGH (ref <150)

## 2022-08-28 LAB — HEMOGLOBIN A1C
Hgb A1c MFr Bld: 7.8 % of total Hgb — ABNORMAL HIGH (ref <5.7)
Mean Plasma Glucose: 177 mg/dL
eAG (mmol/L): 9.8 mmol/L

## 2022-08-28 LAB — TSH: TSH: 2.85 mIU/L (ref 0.40–4.50)

## 2022-08-28 LAB — VITAMIN D 25 HYDROXY (VIT D DEFICIENCY, FRACTURES): Vit D, 25-Hydroxy: 69 ng/mL (ref 30–100)

## 2022-08-30 NOTE — Patient Instructions (Signed)

## 2022-08-31 ENCOUNTER — Other Ambulatory Visit: Payer: Self-pay

## 2022-08-31 DIAGNOSIS — E1122 Type 2 diabetes mellitus with diabetic chronic kidney disease: Secondary | ICD-10-CM

## 2022-08-31 MED ORDER — METFORMIN HCL ER 500 MG PO TB24: ORAL_TABLET | ORAL | 1 refills | Status: DC

## 2022-09-08 ENCOUNTER — Ambulatory Visit: Payer: Medicare Other | Attending: Cardiology

## 2022-09-08 DIAGNOSIS — I4819 Other persistent atrial fibrillation: Secondary | ICD-10-CM

## 2022-09-08 DIAGNOSIS — Z5181 Encounter for therapeutic drug level monitoring: Secondary | ICD-10-CM | POA: Diagnosis not present

## 2022-09-08 LAB — POCT INR: INR: 4.9 — AB (ref 2.0–3.0)

## 2022-09-08 NOTE — Patient Instructions (Signed)
Description   Hold your dosage of Warfarin today and tomorrow, then start taking Warfarin 1 tablet daily. Stay consistent with greens each week (2-3 times per week). Recheck INR in 2 weeks.  Coumadin Clinic:[phone] Fax number [phone]

## 2022-09-22 ENCOUNTER — Ambulatory Visit: Payer: Medicare Other | Attending: Cardiovascular Disease | Admitting: *Deleted

## 2022-09-22 DIAGNOSIS — I4819 Other persistent atrial fibrillation: Secondary | ICD-10-CM | POA: Diagnosis not present

## 2022-09-22 DIAGNOSIS — Z5181 Encounter for therapeutic drug level monitoring: Secondary | ICD-10-CM

## 2022-09-22 LAB — POCT INR: INR: 1.6 — AB (ref 2.0–3.0)

## 2022-09-22 NOTE — Patient Instructions (Signed)
Description   Today take 1.5 tablets of warfarin then continue taking Warfarin 1 tablet daily. Stay consistent with greens each week (2-3 times per week). Recheck INR in 2 weeks.  Coumadin [phone] or [phone] Fax number [phone]

## 2022-09-24 ENCOUNTER — Other Ambulatory Visit: Payer: Self-pay

## 2022-09-24 DIAGNOSIS — Z1211 Encounter for screening for malignant neoplasm of colon: Secondary | ICD-10-CM | POA: Diagnosis not present

## 2022-09-24 DIAGNOSIS — Z1212 Encounter for screening for malignant neoplasm of rectum: Secondary | ICD-10-CM | POA: Diagnosis not present

## 2022-09-24 LAB — POC HEMOCCULT BLD/STL (HOME/3-CARD/SCREEN)
Card #2 Fecal Occult Blod, POC: NEGATIVE
Fecal Occult Blood, POC: NEGATIVE

## 2022-10-06 ENCOUNTER — Ambulatory Visit: Payer: Medicare Other | Attending: Cardiology

## 2022-10-06 DIAGNOSIS — I4819 Other persistent atrial fibrillation: Secondary | ICD-10-CM

## 2022-10-06 DIAGNOSIS — Z5181 Encounter for therapeutic drug level monitoring: Secondary | ICD-10-CM

## 2022-10-06 LAB — POCT INR: INR: 2.5 (ref 2.0–3.0)

## 2022-10-06 NOTE — Patient Instructions (Signed)
continue taking Warfarin 1 tablet daily. Stay consistent with greens each week (2-3 times per week). Recheck INR in 4 weeks.  Coumadin Clinic:[phone] or [phone] Fax number [phone]

## 2022-11-03 ENCOUNTER — Ambulatory Visit: Payer: Medicare Other | Attending: Internal Medicine | Admitting: *Deleted

## 2022-11-03 DIAGNOSIS — I4819 Other persistent atrial fibrillation: Secondary | ICD-10-CM | POA: Diagnosis not present

## 2022-11-03 DIAGNOSIS — Z5181 Encounter for therapeutic drug level monitoring: Secondary | ICD-10-CM | POA: Diagnosis not present

## 2022-11-03 LAB — POCT INR: INR: 1.9 — AB (ref 2.0–3.0)

## 2022-11-03 NOTE — Patient Instructions (Signed)
Description   Today take 1.5 tablets of warfarin then continue taking Warfarin 1 tablet daily. Stay consistent with greens each week (2-3 times per week). Recheck INR in 4 weeks.  Coumadin Clinic:[phone] or [phone] Fax number [phone]

## 2022-11-24 ENCOUNTER — Encounter: Payer: Medicare Other | Admitting: Internal Medicine

## 2022-11-24 ENCOUNTER — Other Ambulatory Visit: Payer: Self-pay | Admitting: Nurse Practitioner

## 2022-11-24 DIAGNOSIS — I10 Essential (primary) hypertension: Secondary | ICD-10-CM

## 2022-11-24 DIAGNOSIS — M1A9XX0 Chronic gout, unspecified, without tophus (tophi): Secondary | ICD-10-CM

## 2022-11-27 ENCOUNTER — Emergency Department (HOSPITAL_BASED_OUTPATIENT_CLINIC_OR_DEPARTMENT_OTHER)
Admission: EM | Admit: 2022-11-27 | Discharge: 2022-11-28 | Disposition: A | Discharge status: Home / Self Care | Payer: Medicare Other | Attending: Emergency Medicine | Admitting: Emergency Medicine

## 2022-11-27 ENCOUNTER — Emergency Department (HOSPITAL_BASED_OUTPATIENT_CLINIC_OR_DEPARTMENT_OTHER): Payer: Medicare Other | Admitting: Radiology

## 2022-11-27 ENCOUNTER — Other Ambulatory Visit: Payer: Self-pay

## 2022-11-27 ENCOUNTER — Encounter (HOSPITAL_BASED_OUTPATIENT_CLINIC_OR_DEPARTMENT_OTHER): Payer: Self-pay | Admitting: Emergency Medicine

## 2022-11-27 DIAGNOSIS — E876 Hypokalemia: Secondary | ICD-10-CM

## 2022-11-27 DIAGNOSIS — R791 Abnormal coagulation profile: Secondary | ICD-10-CM

## 2022-11-27 DIAGNOSIS — M25561 Pain in right knee: Secondary | ICD-10-CM | POA: Diagnosis not present

## 2022-11-27 DIAGNOSIS — Z7901 Long term (current) use of anticoagulants: Secondary | ICD-10-CM | POA: Diagnosis not present

## 2022-11-27 DIAGNOSIS — M25461 Effusion, right knee: Secondary | ICD-10-CM

## 2022-11-27 DIAGNOSIS — M1711 Unilateral primary osteoarthritis, right knee: Secondary | ICD-10-CM | POA: Diagnosis not present

## 2022-11-27 LAB — CBC WITH DIFFERENTIAL/PLATELET
Abs Immature Granulocytes: 0.04 10*3/uL (ref 0.00–0.07)
Basophils Absolute: 0 10*3/uL (ref 0.0–0.1)
Basophils Relative: 0 %
Eosinophils Absolute: 0.1 10*3/uL (ref 0.0–0.5)
Eosinophils Relative: 1 %
HCT: 44.1 % (ref 39.0–52.0)
Hemoglobin: 15.2 g/dL (ref 13.0–17.0)
Immature Granulocytes: 0 %
Lymphocytes Relative: 19 %
Lymphs Abs: 2 10*3/uL (ref 0.7–4.0)
MCH: 33.4 pg (ref 26.0–34.0)
MCHC: 34.5 g/dL (ref 30.0–36.0)
MCV: 96.9 fL (ref 80.0–100.0)
Monocytes Absolute: 0.8 10*3/uL (ref 0.1–1.0)
Monocytes Relative: 7 %
Neutro Abs: 7.6 10*3/uL (ref 1.7–7.7)
Neutrophils Relative %: 73 %
Platelets: 281 10*3/uL (ref 150–400)
RBC: 4.55 MIL/uL (ref 4.22–5.81)
RDW: 15 % (ref 11.5–15.5)
WBC: 10.5 10*3/uL (ref 4.0–10.5)
nRBC: 0 % (ref 0.0–0.2)

## 2022-11-27 MED ORDER — OXYCODONE-ACETAMINOPHEN 5-325 MG PO TABS
1.0000 | ORAL_TABLET | Freq: Once | ORAL | Status: AC
  Administered 2022-11-27: 1 via ORAL
  Filled 2022-11-27: qty 1

## 2022-11-27 NOTE — ED Provider Notes (Signed)
[place] EMERGENCY DEPARTMENT AT [facility] Provider Note   CSN: [id number] Arrival date & time: 11/27/22  1955     History {Add pertinent medical, surgical, social history, OB history to HPI:1} Chief Complaint  Patient presents with   Knee Pain    Christopher Wade is a 72 y.o. male.  Twisted knee a few days ago and has had severe pain and swelling since then to right knee. H/o some type of small loose bone in his knee and this feels similar to that. No fevers, redness or recent illnesses. H/o gout, doesn't know if he's had an attack or not.    Knee Pain      Home Medications Prior to Admission medications   Medication Sig Start Date End Date Taking? Authorizing Provider  acetaminophen (TYLENOL) 325 MG tablet Take 325 mg by mouth as needed for mild pain.    [provider]  allopurinol (ZYLOPRIM) 300 MG tablet TAKE 1 TABLET BY MOUTH DAILY FOR GOUT PREVENTION 11/24/22   Raynelle Dick, NP  amLODipine (NORVASC) 10 MG tablet Take 1 tab  Daily For BP & Heart 08/27/22   [name], NP  Ascorbic Acid (VITAMIN C WITH ROSE HIPS) 500 MG tablet Take 500 mg by mouth daily.    [provider]  atorvastatin (LIPITOR) 80 MG tablet TAKE 1/2 TABLET BY MOUTH DAILY 11/24/22   Raynelle Dick, NP  B Complex Vitamins (B COMPLEX PO) Take 1 tablet by mouth daily.    [provider]  carvedilol (COREG) 12.5 MG tablet TAKE 1 TABLET(12.5 MG) BY MOUTH TWICE DAILY WITH A MEAL 11/24/22   Raynelle Dick, NP  cholecalciferol (VITAMIN D) 1000 UNITS tablet Take 2,000 Units by mouth daily.    [provider]  Ferrous Sulfate (IRON) 325 (65 Fe) MG TABS Take 1 tablet (325 mg total) by mouth every other day. 08/16/21   Judd Gaudier, NP  furosemide (LASIX) 40 MG tablet TAKE 1 TABLET BY MOUTH 1 TO 2 TIMES PER DAY AS NEEDED FOR FLUID RETENTION OR ANKLE SWELLING 08/27/22   [name], NP  levothyroxine (SYNTHROID) 100 MCG tablet Take  1 tablet  Daily  on an empty  stomach with only water for 30 minutes & no Antacid meds, Calcium or Magnesium for 4 hours & avoid Biotin                                                                                 /                                         TAKE                                                     BY  MOUTH 08/27/22   [name], NP  loratadine (CLARITIN) 10 MG tablet Take 10 mg by mouth daily.    [provider]  magnesium gluconate (MAGONATE) 500 MG tablet Take 500mg  TID and with meals. 08/19/21   Judd Gaudier, NP  metFORMIN (GLUCOPHAGE-XR) 500 MG 24 hr tablet Take 2 tablets in the morning and 2 tablets in the evening. 08/31/22   [name], NP  olmesartan (BENICAR) 40 MG tablet TAKE 1 TABLET BY MOUTH DAILY FOR BLOOD PRESSURE 11/24/22   Raynelle Dick, NP  Omega-3 Fatty Acids (FISH OIL) 1200 MG CAPS Take 1,200 mg by mouth daily.    [provider]  pantoprazole (PROTONIX) 40 MG tablet TAKE 1 TABLET BY MOUTH EVERY DAY BEFORE BREAKFAST 08/27/22   [name], NP  warfarin (COUMADIN) 5 MG tablet TAKE 1 TABLET BY MOUTH DAILY. EXCEPT 1 1/2 TABLETS ON MONDAYS OR AS DIRECTED BY ANTICOAGULATION CLINIC. 06/25/22   [name], MD  zinc gluconate 50 MG tablet Take 50 mg by mouth daily.    [provider]      Allergies    Amiodarone and Bee venom    Review of Systems   Review of Systems  Physical Exam Updated Vital Signs BP 126/70 (BP Location: Right Arm)   Pulse 71   Temp 97.8 F (36.6 C)   Resp 18   SpO2 99%  Physical Exam Vitals and nursing note reviewed.  Constitutional:      Appearance: He is well-developed.  HENT:     Head: Normocephalic and atraumatic.  Cardiovascular:     Rate and Rhythm: Normal rate.  Pulmonary:     Effort: Pulmonary effort is normal. No respiratory distress.  Abdominal:     General: There is no distension.  Musculoskeletal:        General: Swelling (right knee) and tenderness present.  Normal range of motion.     Cervical back: Normal range of motion.  Neurological:     Mental Status: He is alert.     ED Results / Procedures / Treatments   Labs (all labs ordered are listed, but only abnormal results are displayed) Labs Reviewed - No data to display  EKG None  Radiology DG Knee Complete 4 Views Right  Result Date: 11/27/2022 CLINICAL DATA:  Right knee pain following twisting injury several days ago, initial encounter EXAM: RIGHT KNEE - COMPLETE 4+ VIEW COMPARISON:  None Available. FINDINGS: Tricompartmental degenerative changes are noted. No acute fracture or dislocation is noted. Small joint effusion is noted. IMPRESSION: Degenerative change with small joint effusion. Electronically Signed   By: Alcide Clever M.D.   On: 11/27/2022 21:12    Procedures Procedures  {Document cardiac monitor, telemetry assessment procedure when appropriate:1}  Medications Ordered in ED Medications - No data to display  ED Course/ Medical Decision Making/ A&P   {   Click here for ABCD2, HEART and other calculatorsREFRESH Note before signing :1}                          Medical Decision Making Amount and/or Complexity of Data Reviewed Radiology: ordered.   ***  {Document critical care time when appropriate:1} {Document review of labs and clinical decision tools ie heart score, Chads2Vasc2 etc:1}  {Document your independent review of radiology images, and any outside records:1} {Document your discussion with family members, caretakers, and with consultants:1} {Document social determinants of health affecting pt's care:1} {Document your decision making why or why  not admission, treatments were needed:1} Final Clinical Impression(s) / ED Diagnoses Final diagnoses:  None    Rx / DC Orders ED Discharge Orders     None

## 2022-11-27 NOTE — ED Triage Notes (Addendum)
Pt presents to ED POV. Pt c/o R knee pain and swelling x1w. Pt reports hx of gout. Pt reports possibly twisting it on wed.

## 2022-11-28 LAB — SYNOVIAL CELL COUNT + DIFF, W/ CRYSTALS
Crystals, Fluid: NONE SEEN
Lymphocytes-Synovial Fld: 13 % (ref 0–20)
Neutrophil, Synovial: 87 % — ABNORMAL HIGH (ref 0–25)
WBC, Synovial: 13500 /mm3 — ABNORMAL HIGH (ref 0–200)

## 2022-11-28 LAB — BASIC METABOLIC PANEL
Anion gap: 17 — ABNORMAL HIGH (ref 5–15)
BUN: 16 mg/dL (ref 8–23)
CO2: 27 mmol/L (ref 22–32)
Calcium: 9.6 mg/dL (ref 8.9–10.3)
Chloride: 97 mmol/L — ABNORMAL LOW (ref 98–111)
Creatinine, Ser: 1.19 mg/dL (ref 0.61–1.24)
GFR, Estimated: >60 mL/min (ref >60)
Glucose, Bld: 125 mg/dL — ABNORMAL HIGH (ref 70–99)
Potassium: 2.9 mmol/L — ABNORMAL LOW (ref 3.5–5.1)
Sodium: 141 mmol/L (ref 135–145)

## 2022-11-28 LAB — PROTIME-INR
INR: 1.6 — ABNORMAL HIGH (ref 0.8–1.2)
Prothrombin Time: 19 seconds — ABNORMAL HIGH (ref 11.4–15.2)

## 2022-11-28 MED ORDER — POTASSIUM CHLORIDE CRYS ER 20 MEQ PO TBCR
40.0000 meq | EXTENDED_RELEASE_TABLET | Freq: Once | ORAL | Status: AC
  Administered 2022-11-28: 40 meq via ORAL
  Filled 2022-11-28: qty 2

## 2022-11-28 MED ORDER — POTASSIUM CHLORIDE CRYS ER 20 MEQ PO TBCR: 40.0000 meq | EXTENDED_RELEASE_TABLET | Freq: Every day | ORAL | 0 refills | Status: DC

## 2022-11-28 MED ORDER — LIDOCAINE HCL 2 % IJ SOLN
INTRAMUSCULAR | Status: AC
  Administered 2022-11-28: 400 mg
  Filled 2022-11-28: qty 20

## 2022-11-28 MED ORDER — MELOXICAM 7.5 MG PO TABS
15.0000 mg | ORAL_TABLET | Freq: Once | ORAL | Status: AC
  Administered 2022-11-28: 15 mg via ORAL
  Filled 2022-11-28: qty 2

## 2022-11-28 MED ORDER — LIDOCAINE-EPINEPHRINE (PF) 2 %-1:200000 IJ SOLN: 20.0000 mL | Freq: Once | INTRAMUSCULAR | Status: DC

## 2022-11-28 MED ORDER — LIDOCAINE-EPINEPHRINE 2 %-1:100000 IJ SOLN: 20.0000 mL | Freq: Once | INTRAMUSCULAR | Status: DC

## 2022-11-28 MED ORDER — MELOXICAM 7.5 MG PO TABS: 7.5000 mg | ORAL_TABLET | Freq: Every day | ORAL | 0 refills | Status: DC

## 2022-11-28 MED ORDER — OXYCODONE-ACETAMINOPHEN 5-325 MG PO TABS: 1.0000 | ORAL_TABLET | Freq: Three times a day (TID) | ORAL | 0 refills | Status: DC | PRN

## 2022-11-28 NOTE — ED Notes (Signed)
Pt SPO2 87% RA following pain medicine, 2LNC placed, SPO2 95%, [place] MD made aware

## 2022-11-28 NOTE — ED Notes (Signed)
Ace wrap applied to pt's right knee.

## 2022-11-28 NOTE — ED Notes (Signed)
ED Provider at bedside. 

## 2022-11-28 NOTE — ED Notes (Signed)
Pt weaned off [place], 94% RA

## 2022-11-29 LAB — GLUCOSE, BODY FLUID OTHER: Glucose, Body Fluid Other: 93 mg/dL

## 2022-11-29 LAB — PROTEIN, BODY FLUID (OTHER): Total Protein, Body Fluid Other: 4.6 g/dL

## 2022-12-01 ENCOUNTER — Ambulatory Visit: Payer: Medicare Other | Attending: Cardiology

## 2022-12-01 DIAGNOSIS — Z5181 Encounter for therapeutic drug level monitoring: Secondary | ICD-10-CM | POA: Diagnosis not present

## 2022-12-01 DIAGNOSIS — I4819 Other persistent atrial fibrillation: Secondary | ICD-10-CM | POA: Diagnosis not present

## 2022-12-01 LAB — POCT INR: INR: 2.4 (ref 2.0–3.0)

## 2022-12-01 LAB — BODY FLUID CULTURE W GRAM STAIN: Culture: NO GROWTH

## 2022-12-01 NOTE — Progress Notes (Unsigned)
Hospital follow up  Assessment and Plan: Hospital visit follow up for:   Hospital discharge follow-up Reviewed discharge instructions in full including medication changes, diagnostics, labs, and future follow ups appointment. All questions and concerns addressed.   - CBC with Differential/Platelet - COMPLETE METABOLIC PANEL WITH GFR  Acute pain of right knee/effusion Continue brace and crutch support, elevation Monitor for swelling, redness, pain, warmth and contact office if noticed.  - CBC with Differential/Platelet  Hypokalemia  - COMPLETE METABOLIC PANEL WITH GFR  Subtherapeutic international normalized ratio (INR) Re-checked at coumadin clinic 12/01/22 and WNL Continue to follow as directed  Medication management All medications discussed and reviewed in full. All questions and concerns regarding medications addressed.    - CBC with Differential/Platelet - COMPLETE METABOLIC PANEL WITH GFR     All medications were reviewed with patient and fully reconciled. All questions answered fully, and patient and family members were encouraged to call the office with any further questions or concerns. Discussed goal to avoid readmission related to this diagnosis.   Over 30 minutes of exam, counseling, chart review, and complex, high/moderate level critical decision making was performed this visit.   Future Appointments  Date Time Provider Department Center  12/10/2022  3:00 PM [name], MD [facility] None  12/22/2022  1:00 PM [facility] None     HPI 71 y.o.male presents for follow up for transition from recent hospitalization ER stay. Admit date to the hospital was 11/27/22, patient was discharged from the hospital on 11/28/22 and our clinical staff contacted the office the day after discharge to set up a follow up appointment. The discharge summary, medications, and diagnostic test results were reviewed before meeting with the patient. The  patient was admitted for right knee pain.   Twisted knee a few days prior to being seen in ER.  He continued to have severe pain and swelling since then to right knee. H/o some type of small loose bone in his knee and this feels similar to that. No fevers, redness or recent illnesses. Reports hx of gout.  Last uric acid level 11/19/21 WNL.  Takes Allopurinol daily.  Gram stain without organisms. Cell count without crystals and WBC c/w inflammation rather than infection. Pain improved after aspiration. Suspect ligament sprain vs arthritis vs occult bony injury, mobic w/ PRN percocet for next week or so. He has only been taking Tylenol with perceived benefit.  K low, has been taking Potassium.   INR was also found to be low. Coumadin was doubled Saturday and Sunday.  He had levels checked 12/01/22 at coumadin clinic and INR was WNL at 2.4.   Home health is not involved.   Images while in the hospital: DG Knee Complete 4 Views Right  Result Date: 11/27/2022 CLINICAL DATA:  Right knee pain following twisting injury several days ago, initial encounter EXAM: RIGHT KNEE - COMPLETE 4+ VIEW COMPARISON:  None Available. FINDINGS: Tricompartmental degenerative changes are noted. No acute fracture or dislocation is noted. Small joint effusion is noted. IMPRESSION: Degenerative change with small joint effusion. Electronically Signed   By: Alcide Clever M.D.   On: 11/27/2022 21:12     Current Outpatient Medications (Endocrine & Metabolic):    levothyroxine (SYNTHROID) 100 MCG tablet, Take  1 tablet  Daily  on an empty stomach with only water for 30 minutes & no Antacid meds, Calcium or Magnesium for 4 hours & avoid Biotin                                                                                 /  TAKE                                                     BY                                                  MOUTH   metFORMIN (GLUCOPHAGE-XR) 500 MG 24 hr tablet, Take 2  tablets in the morning and 2 tablets in the evening.  Current Outpatient Medications (Cardiovascular):    amLODipine (NORVASC) 10 MG tablet, Take 1 tab  Daily For BP & Heart   atorvastatin (LIPITOR) 80 MG tablet, TAKE 1/2 TABLET BY MOUTH DAILY   carvedilol (COREG) 12.5 MG tablet, TAKE 1 TABLET(12.5 MG) BY MOUTH TWICE DAILY WITH A MEAL   furosemide (LASIX) 40 MG tablet, TAKE 1 TABLET BY MOUTH 1 TO 2 TIMES PER DAY AS NEEDED FOR FLUID RETENTION OR ANKLE SWELLING   olmesartan (BENICAR) 40 MG tablet, TAKE 1 TABLET BY MOUTH DAILY FOR BLOOD PRESSURE  Current Outpatient Medications (Respiratory):    loratadine (CLARITIN) 10 MG tablet, Take 10 mg by mouth daily.  Current Outpatient Medications (Analgesics):    acetaminophen (TYLENOL) 325 MG tablet, Take 325 mg by mouth as needed for mild pain.   allopurinol (ZYLOPRIM) 300 MG tablet, TAKE 1 TABLET BY MOUTH DAILY FOR GOUT PREVENTION   meloxicam (MOBIC) 7.5 MG tablet, Take 1 tablet (7.5 mg total) by mouth daily.   oxyCODONE-acetaminophen (PERCOCET/ROXICET) 5-325 MG tablet, Take 1 tablet by mouth every 8 (eight) hours as needed for severe pain.  Current Outpatient Medications (Hematological):    Ferrous Sulfate (IRON) 325 (65 Fe) MG TABS, Take 1 tablet (325 mg total) by mouth every other day.   warfarin (COUMADIN) 5 MG tablet, TAKE 1 TABLET BY MOUTH DAILY. EXCEPT 1 1/2 TABLETS ON MONDAYS OR AS DIRECTED BY ANTICOAGULATION CLINIC.  Current Outpatient Medications (Other):    Ascorbic Acid (VITAMIN C WITH ROSE HIPS) 500 MG tablet, Take 500 mg by mouth daily.   B Complex Vitamins (B COMPLEX PO), Take 1 tablet by mouth daily.   cholecalciferol (VITAMIN D) 1000 UNITS tablet, Take 2,000 Units by mouth daily.   magnesium gluconate (MAGONATE) 500 MG tablet, Take 500mg  TID and with meals.   Omega-3 Fatty Acids (FISH OIL) 1200 MG CAPS, Take 1,200 mg by mouth daily.   pantoprazole (PROTONIX) 40 MG tablet, TAKE 1 TABLET BY MOUTH EVERY DAY BEFORE BREAKFAST    potassium chloride SA (KLOR-CON M) 20 MEQ tablet, Take 2 tablets (40 mEq total) by mouth daily for 7 days.   zinc gluconate 50 MG tablet, Take 50 mg by mouth daily.  Past Medical History:  Diagnosis Date   AKI (acute kidney injury) (HCC) 05/27/2020   Arthritis    "joints get stiff" (06/11/2016)   Bee sting allergy    Bee sting allergy    CAD (coronary artery disease), native coronary artery 02/12/2016   cath showing 50% LM, 40% distal LAD, 70% ost to prox LCs, 40% mid LAD and 95% mid to dist RCA s/p PCI with DES x 2 and stage PCI of the LCx/OM.   Carotid artery stenosis    1-39% bilateral carotid stenosis by dopples 06/2022  Duodenal ulcer    History of gout    Hx of adenomatous colonic polyps 04/10/2008   Hyperlipidemia    Hypertension    Hypothyroidism    OSA (obstructive sleep apnea)    "tried CPAP; couldn't do it" (06/11/2016)   Persistent atrial fibrillation (HCC)    s/p DCCV 03/2015 with reversion back to atrial fibrillation, loaded with Amio and s/p DCCV to NSR 11/2015.  He is now s/p afib albation.  He had amio lung toxicity and amio was stopped.   Subclavian artery stenosis (HCC)    right subclavian artery stenosis by dopplers 2/24   Thyroid disease    Type II diabetes mellitus (HCC)    Vitamin D deficiency      Allergies  Allergen Reactions   Amiodarone Other (See Comments)    Lung toxicity   Bee Venom Other (See Comments)    Leg went numb    ROS: all negative except above.   Physical Exam: Filed Weights   12/02/22 1115  Weight: 228 lb 12.8 oz (103.8 kg)   BP 134/70   Pulse 67   Temp 97.8 F (36.6 C)   Ht 5\' 11"  (1.803 m)   Wt 228 lb 12.8 oz (103.8 kg)   SpO2 97%   BMI 31.91 kg/m  General Appearance: Crutch. Well nourished, in no apparent distress. Eyes: PERRLA, EOMs, conjunctiva no swelling or erythema Sinuses: No Frontal/maxillary tenderness ENT/Mouth: Ext aud canals clear, TMs without erythema, bulging. No erythema, swelling, or exudate on post  pharynx.  Tonsils not swollen or erythematous. Hearing normal.  Neck: Supple, thyroid normal.  Respiratory: Respiratory effort normal, BS equal bilaterally without rales, rhonchi, wheezing or stridor.  Cardio: RRR with no MRGs. Brisk peripheral pulses without edema.  Abdomen: Soft, + BS.  Non tender, no guarding, rebound, hernias, masses. Lymphatics: Non tender without lymphadenopathy.  Musculoskeletal: Right ACE bandage intact Full ROM, 5/5 strength, normal gait.  Skin: Warm, dry without rashes, lesions, ecchymosis.  Neuro: Cranial nerves intact. Normal muscle tone, no cerebellar symptoms. Sensation intact.  Psych: Awake and oriented X 3, normal affect, Insight and Judgment appropriate.     [name], NP 1:04 PM [facility]

## 2022-12-01 NOTE — Patient Instructions (Signed)
Description   Start taking Warfarin 1 tablet daily except 1.5 tablets on Mondays. Stay consistent with greens each week (2-3 times per week). Recheck INR in 3 weeks.  Coumadin Clinic:[phone] or [phone] Fax number [phone]

## 2022-12-02 ENCOUNTER — Ambulatory Visit (INDEPENDENT_AMBULATORY_CARE_PROVIDER_SITE_OTHER): Payer: Medicare Other | Admitting: Nurse Practitioner

## 2022-12-02 ENCOUNTER — Encounter: Payer: Self-pay | Admitting: Nurse Practitioner

## 2022-12-02 ENCOUNTER — Encounter: Payer: Medicare Other | Admitting: Internal Medicine

## 2022-12-02 VITALS — BP 134/70 | HR 67 | Temp 97.8°F | Ht 71.0 in | Wt 228.8 lb

## 2022-12-02 DIAGNOSIS — M25561 Pain in right knee: Secondary | ICD-10-CM

## 2022-12-02 DIAGNOSIS — R791 Abnormal coagulation profile: Secondary | ICD-10-CM | POA: Diagnosis not present

## 2022-12-02 DIAGNOSIS — Z09 Encounter for follow-up examination after completed treatment for conditions other than malignant neoplasm: Secondary | ICD-10-CM | POA: Diagnosis not present

## 2022-12-02 DIAGNOSIS — E876 Hypokalemia: Secondary | ICD-10-CM

## 2022-12-02 DIAGNOSIS — Z79899 Other long term (current) drug therapy: Secondary | ICD-10-CM | POA: Diagnosis not present

## 2022-12-02 DIAGNOSIS — M25461 Effusion, right knee: Secondary | ICD-10-CM

## 2022-12-02 NOTE — Patient Instructions (Signed)
RICE Therapy for Routine Care of Injuries Many injuries can be cared for with rest, ice, compression, and elevation (RICE therapy). This includes: Resting the injured body part. Putting ice on the injury. Putting pressure (compression) on the injury. Raising the injured part (elevation). Using RICE therapy can help to lessen pain and swelling. Supplies needed: Ice. Plastic bag. Towel. Elastic bandage. Pillow or pillows to raise your injured body part. How to care for your injury with RICE therapy Rest Try to rest the injured part of your body. You can go back to your normal activities when your doctor says it is okay to do them and when you can do them without pain. If you rest the injury too much, it may not heal as well. Some injuries heal better with early movement instead of resting for too long. Ask your doctor if you should do exercises to help your injury get better. Ice  If told, put ice on the injured area. To do this: Put ice in a plastic bag. Place a towel between your skin and the bag. Leave the ice on for 20 minutes, 2-3 times a day. Take off the ice if your skin turns bright red. This is very important. If you cannot feel pain, heat, or cold, you have a greater risk of damage to the area. Do not put ice on your bare skin. Use ice for as many days as your doctor tells you to use it. Compression Put pressure on the injured area. This can be done with an elastic bandage. If this type of bandage has been put on your injury: Follow instructions on the package the bandage came in about how to use it. Do not wrap the bandage too tightly. Wrap the bandage more loosely if part of your body beyond the bandage is blue, swollen, cold, painful, or loses feeling. Take off the bandage and put it on again every 3-4 hours or as told by your doctor. See your doctor if the bandage seems to make your problems worse.  Elevation Raise the injured area above the level of your heart while you  are sitting or lying down. Follow these instructions at home: If your symptoms get worse or last a long time, make a follow-up appointment with your doctor. You may need to have imaging tests, such as X-rays or an MRI. If you have imaging tests, ask how to get your results when they are ready. Return to your normal activities when your doctor says that it is safe. Keep all follow-up visits. Contact a doctor if: You keep having pain and swelling. Your symptoms get worse. Get help right away if: You have sudden, very bad pain at your injury or lower than your injury. You have redness or more swelling around your injury. You have tingling or numbness at your injury or lower than your injury, and it does not go away when you take off the bandage. Summary Many injuries can be cared for using rest, ice, compression, and elevation (RICE therapy). You can go back to your normal activities when your doctor says it is okay and when you can do them without pain. Put ice on the injured area as told by your doctor. Get help if your symptoms get worse or if you keep having pain and swelling. This information is not intended to replace advice given to you by your health care provider. Make sure you discuss any questions you have with your health care provider. Document Revised: 02/29/2020 Document Reviewed: 02/29/2020   Elsevier Patient Education  2024 [employer]  

## 2022-12-03 ENCOUNTER — Other Ambulatory Visit: Payer: Self-pay | Admitting: Internal Medicine

## 2022-12-03 LAB — COMPLETE METABOLIC PANEL WITH GFR
AG Ratio: 1.1 (calc) (ref 1.0–2.5)
ALT: 18 U/L (ref 9–46)
AST: 21 U/L (ref 10–35)
Albumin: 4.3 g/dL (ref 3.6–5.1)
Alkaline phosphatase (APISO): 92 U/L (ref 35–144)
BUN: 13 mg/dL (ref 7–25)
CO2: 28 mmol/L (ref 20–32)
Calcium: 9.7 mg/dL (ref 8.6–10.3)
Chloride: 100 mmol/L (ref 98–110)
Creat: 1.14 mg/dL (ref 0.70–1.28)
Globulin: 3.9 g/dL (calc) — ABNORMAL HIGH (ref 1.9–3.7)
Glucose, Bld: 151 mg/dL — ABNORMAL HIGH (ref 65–99)
Potassium: 3.7 mmol/L (ref 3.5–5.3)
Sodium: 141 mmol/L (ref 135–146)
Total Bilirubin: 0.5 mg/dL (ref 0.2–1.2)
Total Protein: 8.2 g/dL — ABNORMAL HIGH (ref 6.1–8.1)
eGFR: 68 mL/min/{1.73_m2} (ref >=60)

## 2022-12-03 LAB — CBC WITH DIFFERENTIAL/PLATELET
Absolute Monocytes: 536 cells/uL (ref 200–950)
Basophils Absolute: 43 cells/uL (ref 0–200)
Basophils Relative: 0.5 %
Eosinophils Absolute: 204 cells/uL (ref 15–500)
Eosinophils Relative: 2.4 %
HCT: 44.7 % (ref 38.5–50.0)
Hemoglobin: 15 g/dL (ref 13.2–17.1)
Lymphs Abs: 1641 cells/uL (ref 850–3900)
MCH: 33.3 pg — ABNORMAL HIGH (ref 27.0–33.0)
MCHC: 33.6 g/dL (ref 32.0–36.0)
MCV: 99.3 fL (ref 80.0–100.0)
MPV: 9.8 fL (ref 7.5–12.5)
Monocytes Relative: 6.3 %
Neutro Abs: 6078 cells/uL (ref 1500–7800)
Neutrophils Relative %: 71.5 %
Platelets: 324 10*3/uL (ref 140–400)
RBC: 4.5 10*6/uL (ref 4.20–5.80)
RDW: 14 % (ref 11.0–15.0)
Total Lymphocyte: 19.3 %
WBC: 8.5 10*3/uL (ref 3.8–10.8)

## 2022-12-03 MED ORDER — DEXAMETHASONE 4 MG PO TABS: ORAL_TABLET | ORAL | 0 refills | Status: DC

## 2022-12-04 ENCOUNTER — Telehealth: Payer: Self-pay

## 2022-12-04 NOTE — Telephone Encounter (Signed)
Transition Care Management Unsuccessful Follow-up Telephone Call  Date of discharge and from where:  [facility] 7/6  Attempts:  1st Attempt  Reason for unsuccessful TCM follow-up call:  No answer/busy   Lenard Forth [facility] Guide, [facility] [phone] [address] [place], [place], [place] [address] Phone: [phone] Email: [email or web address]

## 2022-12-07 ENCOUNTER — Telehealth: Payer: Self-pay

## 2022-12-07 ENCOUNTER — Telehealth: Payer: Self-pay | Admitting: Nurse Practitioner

## 2022-12-07 ENCOUNTER — Other Ambulatory Visit: Payer: Self-pay | Admitting: Cardiology

## 2022-12-07 DIAGNOSIS — I4819 Other persistent atrial fibrillation: Secondary | ICD-10-CM

## 2022-12-07 NOTE — Telephone Encounter (Signed)
Refill- Warfarin Pharm- [facility] [address] On file

## 2022-12-07 NOTE — Telephone Encounter (Signed)
Transition Care Management Unsuccessful Follow-up Telephone Call  Date of discharge and from where:  [facility] 7/6  Attempts:  2nd Attempt  Reason for unsuccessful TCM follow-up call:  No answer/busy   Lenard Forth [facility] Guide, [facility] [phone] [address] [place], [place], [place] [address] Phone: [phone] Email: [email or web address]

## 2022-12-08 NOTE — Telephone Encounter (Signed)
Filled by the Coumadin Clinic

## 2022-12-09 ENCOUNTER — Encounter: Payer: Self-pay | Admitting: Internal Medicine

## 2022-12-09 NOTE — Progress Notes (Signed)
Annual  Screening/Preventative Visit  & Comprehensive Evaluation & Examination   Future Appointments  Date Time Provider Department  12/10/2022                       cpe  3:00 PM [name], MD [facility]  12/22/2023                       cpe  3:00 PM [name], MD [facility]  Had wellness Apr 2024 - Tonya       This very nice 72 y.o. single WM presents for a Screening /Preventative Visit & comprehensive evaluation and management of multiple medical co-morbidities.  Patient has been followed for HTN, ASHD/pAfib, HLD, T2_NIDDM  and Vitamin D Deficiency. Patient has OSA , but was mask intolerant. Patient is followed by Dr Leone Payor for Barrett's Esophagus.        HTN predates circa 2006. Patient's BP has been controlled at home.  Today's BP was initially slightly elevated and rechecked at goal - 138/84.  He was dx'd with Atrial fibrillation in 2016 and in  2016 & 2017, he failed CV x 2.   In 2018, he had an Ablation. He was treated with Amiodarione which was stopped when he developed Pulmonary toxicity.  Patient is on Coumadin managed  at [facility] .  Patient denies any cardiac symptoms as chest pain, palpitations, shortness of breath, dizziness or ankle swelling.        Patient's hyperlipidemia is controlled with diet and medications. Patient denies myalgias or other medication SE's. Last lipids were at goal except sl elevated Trig's:  Lab Results  Component Value Date   CHOL 132 12/10/2022   HDL 46 12/10/2022   LDLCALC 64 12/10/2022   TRIG 136 12/10/2022   CHOLHDL 2.9 12/10/2022          Patient has Morbid Obesity  (BMI 35+) and consequent T2_NIDDM (2012) w/CKD2  (GFR 74) and patient denies reactive hypoglycemic symptoms, visual blurring, diabetic polys or paresthesias. Last A1c was not at goal:   Lab Results  Component Value Date   HGBA1C 7.7 (H) 12/10/2022         Patient has been on  Thyroid Replacement since 2011.  Finally, patient has history of Vitamin D  Deficiency ("23" / 2009) and last vitamin D was near goal  (70-100):   Lab Results  Component Value Date   VD25OH 63 12/10/2022                    Current Outpatient Medications on File Prior to Visit  Medication Sig   acetaminophen 325 MG tablet Take 650 mg  every 6 hours as needed for mild pain.   allopurinol 300 MG tablet TAKE 1 TABLET DAILY    amLODipine 10 MG tablet Take 1 tab Daily For BP & Heart   VITAMIN C  500 MG tablet Take daily.   atorvastatin 80 MG tablet Take 40 mg daily.   B Complex Vitamins  Take 1 tablet daily.   carvedilol (COREG) 12.5 MG tablet TAKE 2 TABLETS  TWICE DAILY    VITAMIN D  1000 UNITS tablet Take 2,000 Units by mouth daily.   colchicine 0.6 MG tablet Take 1 tablet daily to prevent Gout Attacks   Levothyroxine  100 MCG tablet TAKE 1 TABLET DAILY    loratadine  10 MG tablet Take 10 mg by mouth daily.   magnesium  500 MG tablet  Take 1,000 mg by mouth daily.    metFORMIN-XR 500 MG 24 hr tablet Take  2 tablets  2 x /day with Meals     Olmesartan  40 MG tablet Take 1 tablet Daily for BP   Omega-3 FISH OIL 1200 MG CAPS Take 1,200 mg by mouth daily.   pantoprazole  40 MG tablet Take 1 tablet  daily before breakfast.   COUMADIN 5 MG tablet Take 1 tablet on Thurs & Sun  and 1.5 tablets all other days.   zinc 50 MG tablet Take  daily.    Allergies  Allergen Reactions   Amiodarone Other (See Comments)    Lung toxicity   Bee Venom Other (See Comments)    Leg went numb    Past Medical History:  Diagnosis Date   AKI (acute kidney injury) (HCC) 05/27/2020   Arthritis    "joints get stiff" (06/11/2016)   Bee sting allergy    Bee sting allergy    CAD (coronary artery disease), native coronary artery 02/12/2016   cath showing 50% LM, 40% distal LAD, 70% ost to prox LCs, 40% mid LAD and 95% mid to dist RCA s/p PCI with DES x 2 and stage PCI of the LCx/OM.   Carotid artery stenosis    1-39% bilateral by dopplers 08/2017   History of gout    Hx of adenomatous  colonic polyps 04/10/2008   Hyperlipidemia    Hypertension    Hypothyroidism    OSA (obstructive sleep apnea)    "tried CPAP; couldn't do it" (06/11/2016)   Persistent atrial fibrillation (HCC)    s/p DCCV 03/2015 with reversion back to atrial fibrillation, loaded with Amio and s/p DCCV to NSR 11/2015.  He is now s/p afib albation.  He had amiodarone  lung toxicity and it was stopped.   Thyroid disease    Type II diabetes mellitus (HCC)    Vitamin D deficiency      Health Maintenance  Topic Date Due   Zoster Vaccines- Shingrix (1 of 2) Never done   COVID-19 Vaccine (4 - Booster for Pfizer series) 10/06/2020   FOOT EXAM  10/31/2020   INFLUENZA VACCINE  12/23/2020   OPHTHALMOLOGY EXAM  01/03/2021   HEMOGLOBIN A1C  02/05/2021   COLONOSCOPY  10/06/2022   TETANUS/TDAP  05/07/2030   Hepatitis C Screening  Completed   PNA vac Low Risk Adult  Completed   HPV VACCINES  Aged Out     Immunization History  Administered Date(s) Administered   Influenza Split 04/05/2014   Influenza, High Dose  03/31/2018, 03/20/2019, 05/07/2020   Influenza 02/02/2013, 05/21/2016   PFIZER SARS-COV-2 Vacc 12/13/2019, 01/03/2020, 07/09/2020   Pneumococcal-13 05/01/2014   Pneumococcal -23 05/16/2015   Td 04/25/2010, 05/07/2020   Varicella Zoster Immune Globulin 02/22/2014    Last Colon - 10/05/2017 - Dr Leone Payor recc 5 yr f/u due May 2024   Past Surgical History:  Procedure Laterality Date   BIOPSY  05/28/2020   Procedure: BIOPSY;  Surgeon: Beverley Fiedler, MD;  Location: [facility] ENDOSCOPY;  Service: Gastroenterology;;   CARDIAC CATHETERIZATION N/A 10/11/2015   Procedure: Left Heart Cath and Coronary Angiography;  Surgeon: Wendall Stade, MD;  Location: [facility] INVASIVE CV LAB;  Service: Cardiovascular;  Laterality: N/A;   CARDIAC CATHETERIZATION N/A 10/11/2015   Procedure: Coronary Stent Intervention;  Surgeon: Peter M Swaziland, MD;  Location: [facility] INVASIVE CV LAB;  Service: Cardiovascular;  Laterality: N/A;   CARDIAC  CATHETERIZATION N/A 10/11/2015   Procedure: Intravascular Pressure  Wire/FFR Study;  Surgeon: Peter M Swaziland, MD;  Location: [facility] INVASIVE CV LAB;  Service: Cardiovascular;  Laterality: N/A;   CARDIAC CATHETERIZATION N/A 10/14/2015   Procedure: Coronary Stent Intervention;  Surgeon: Corky Crafts, MD;  Location: [facility] INVASIVE CV LAB;  Service: Cardiovascular;  Laterality: N/A;   CARDIOVERSION N/A 04/05/2015   Procedure: CARDIOVERSION;  Surgeon: [name], MD;  Location: [facility] ENDOSCOPY;  Service: Cardiovascular;  Laterality: N/A;   CARDIOVERSION N/A 12/09/2015   Procedure: CARDIOVERSION;  Surgeon: [name], MD;  Location: [facility] ENDOSCOPY;  Service: Cardiovascular;  Laterality: N/A;   CARDIOVERSION N/A 03/16/2016   Procedure: CARDIOVERSION;  Surgeon: Chilton Si, MD;  Location: [facility] ENDOSCOPY;  Service: Cardiovascular;  Laterality: N/A;   CATARACT EXTRACTION Right 2019   Dr. Lucious Groves   CORONARY ANGIOPLASTY     ELECTROPHYSIOLOGIC STUDY N/A 06/11/2016   Procedure: Atrial Fibrillation Ablation;  Surgeon: Will Jorja Loa, MD;  Location: [facility] INVASIVE CV LAB;  Service: Cardiovascular;  Laterality: N/A;   ESOPHAGOGASTRODUODENOSCOPY (EGD) WITH PROPOFOL N/A 05/28/2020   Procedure: ESOPHAGOGASTRODUODENOSCOPY (EGD) WITH PROPOFOL;  Surgeon: Beverley Fiedler, MD;  Location: [facility] ENDOSCOPY;  Service: Gastroenterology;  Laterality: N/A;   TOTAL HIP ARTHROPLASTY Bilateral 2001   UVULECTOMY       Family History  Problem Relation Age of Onset   Lung cancer Mother    Liver disease Mother    Lung cancer Father    Liver disease Father    Colon cancer Sister    Liver disease Sister    Prostate cancer Brother    Liver disease Brother    Stomach cancer Neg Hx    Pancreatic cancer Neg Hx    Esophageal cancer Neg Hx     Social History   Socioeconomic History   Marital status: Single    Spouse name: Not on file   Number of children: Not on file  Occupational History     Tobacco Use   Smoking status:  Never   Smokeless tobacco: Never  Vaping Use   Vaping Use: Never used  Substance and Sexual Activity   Alcohol use: No   Drug use: No   Sexual activity: Not Currently  Social History Narrative   He is single, he has been in [employer] for his whole life.     Denies alcohol tobacco or drug use      ROS Constitutional: Denies fever, chills, weight loss/gain, headaches, insomnia,  night sweats or change in appetite. Does c/o fatigue. Eyes: Denies redness, blurred vision, diplopia, discharge, itchy or watery eyes.  ENT: Denies discharge, congestion, post nasal drip, epistaxis, sore throat, earache, hearing loss, dental pain, Tinnitus, Vertigo, Sinus pain or snoring.  Cardio: Denies chest pain, palpitations, irregular heartbeat, syncope, dyspnea, diaphoresis, orthopnea, PND, claudication or edema Respiratory: denies cough, dyspnea, DOE, pleurisy, hoarseness, laryngitis or wheezing.  Gastrointestinal: Denies dysphagia, heartburn, reflux, water brash, pain, cramps, nausea, vomiting, bloating, diarrhea, constipation, hematemesis, melena, hematochezia, jaundice or hemorrhoids Genitourinary: Denies dysuria, frequency, urgency, nocturia, hesitancy, discharge, hematuria or flank pain Musculoskeletal: Denies arthralgia, myalgia, stiffness, Jt. Swelling, pain, limp or strain/sprain. Denies Falls. Skin: Denies puritis, rash, hives, warts, acne, eczema or change in skin lesion Neuro: No weakness, tremor, incoordination, spasms, paresthesia or pain Psychiatric: Denies confusion, memory loss or sensory loss. Denies Depression. Endocrine: Denies change in weight, skin, hair change, nocturia, and paresthesia, diabetic polys, visual blurring or hyper / hypo glycemic episodes.  Heme/Lymph: No excessive bleeding, bruising or enlarged lymph nodes.   Physical Exam  BP  130/68   Pulse 71   Temp 98.3 F (36.8 C)   Ht 5\' 11"  (1.803 m)   Wt 237 lb 9.6 oz (107.8 kg)   SpO2 97%   BMI 33.14 kg/m   General  Appearance: Well nourished and well groomed and in no apparent distress.  Eyes: PERRLA, EOMs, conjunctiva no swelling or erythema, normal fundi and vessels. Sinuses: No frontal/maxillary tenderness ENT/Mouth: EACs patent / TMs  nl. Nares clear without erythema, swelling, mucoid exudates. Oral hygiene is good. No erythema, swelling, or exudate. Tongue normal, non-obstructing. Tonsils not swollen or erythematous. Hearing normal.  Neck: Supple, thyroid not palpable. No bruits, nodes or JVD. Respiratory: Respiratory effort normal.  BS equal and clear bilateral without rales, rhonci, wheezing or stridor. Cardio: Heart sounds are normal with regular rate and rhythm and no murmurs, rubs or gallops. Peripheral pulses are normal and equal bilaterally without edema. No aortic or femoral bruits. Chest: symmetric with normal excursions and percussion.  Abdomen: Soft, with Nl bowel sounds. Nontender, no guarding, rebound, hernias, masses, or organomegaly.  Lymphatics: Non tender without lymphadenopathy.  Musculoskeletal: Full ROM all peripheral extremities, joint stability, 5/5 strength, and normal gait. Skin: Warm and dry without rashes, lesions, cyanosis, clubbing or  ecchymosis.  Neuro: Cranial nerves intact, reflexes equal bilaterally. Normal muscle tone, no cerebellar symptoms. Sensation intact.  Pysch: Alert and oriented X 3 with normal affect, insight and judgment appropriate.   Assessment and Plan  1. Annual Preventative/Screening Exam    2. Essential hypertension  - Microalbumin / creatinine urine ratio - POC Hemoccult Bld/Stl (3-Cd Home Screen); Future - CBC with Differential/Platelet - COMPLETE METABOLIC PANEL WITH GFR - Magnesium - TSH - EKG 12-Lead - [place], RETROPERITNL ABD,  LTD   3. Hyperlipidemia associated with type 2 diabetes mellitus (HCC)  - Lipid panel - TSH - EKG 12-Lead - [place], RETROPERITNL ABD,  LTD   4. Type 2 diabetes mellitus with stage 2 chronic kidney disease,  without long-term current use of insulin (HCC)  - HM DIABETES FOOT EXAM - LOW EXTREMITY NEUR EXAM DOCUM - Hemoglobin A1c - Insulin, random - EKG 12-Lead - [place], RETROPERITNL ABD,  LTD   5. Vitamin D deficiency  - VITAMIN D 25 Hydroxy    6. Hypothyroidism  - TSH   7. Paroxysmal atrial fibrillation (HCC)  - TSH - EKG 12-Lead   8. Aortic atherosclerosis (HCC) by Chest CT scan in 2018  - EKG 12-Lead - [place], RETROPERITNL ABD,  LTD   9. Coronary artery disease involving native coronary  artery of native heart without angina pectoris  - EKG 12-Lead   10. Chronic gout   - Uric acid   11. Barrett's esophagus without dysplasia   12. OSA (obstructive sleep apnea)   13. BPH with obstruction/lower urinary tract symptoms  - PSA  14. Screening for colorectal cancer  - POC Hemoccult Bld/Stl  15. Prostate cancer screening  - PSA   16. Screening for ischemic heart disease  - EKG 12-Lead   17. FH: hypertension  - EKG 12-Lead - [place], RETROPERITNL ABD,  LTD   18. Screening for AAA (aortic abdominal aneurysm)  - [place], RETROPERITNL ABD,  LTD   19. Medication management  - Uric acid - CBC with Differential/Platelet - COMPLETE METABOLIC PANEL WITH GFR - Magnesium - Lipid panel - TSH - Hemoglobin A1c - Insulin, random - VITAMIN D 25 Hydroxy           Patient was counseled in prudent diet, weight control  to achieve/maintain BMI less than 25, BP monitoring, regular exercise and medications as discussed.  Discussed med effects and SE's. Routine screening labs and tests as requested with regular follow-up as recommended. Over 40 minutes of exam, counseling, chart review and high complex critical decision making was performed   Christopher Maw, MD

## 2022-12-09 NOTE — Patient Instructions (Signed)

## 2022-12-10 ENCOUNTER — Encounter: Payer: Self-pay | Admitting: Internal Medicine

## 2022-12-10 ENCOUNTER — Ambulatory Visit (INDEPENDENT_AMBULATORY_CARE_PROVIDER_SITE_OTHER): Payer: Medicare Other | Admitting: Internal Medicine

## 2022-12-10 VITALS — BP 130/68 | HR 71 | Temp 98.3°F | Ht 71.0 in | Wt 237.6 lb

## 2022-12-10 DIAGNOSIS — Z79899 Other long term (current) drug therapy: Secondary | ICD-10-CM | POA: Diagnosis not present

## 2022-12-10 DIAGNOSIS — Z125 Encounter for screening for malignant neoplasm of prostate: Secondary | ICD-10-CM

## 2022-12-10 DIAGNOSIS — I480 Paroxysmal atrial fibrillation: Secondary | ICD-10-CM

## 2022-12-10 DIAGNOSIS — I2510 Atherosclerotic heart disease of native coronary artery without angina pectoris: Secondary | ICD-10-CM | POA: Diagnosis not present

## 2022-12-10 DIAGNOSIS — Z0001 Encounter for general adult medical examination with abnormal findings: Secondary | ICD-10-CM

## 2022-12-10 DIAGNOSIS — N401 Enlarged prostate with lower urinary tract symptoms: Secondary | ICD-10-CM

## 2022-12-10 DIAGNOSIS — I700 Atherosclerosis of aorta: Secondary | ICD-10-CM

## 2022-12-10 DIAGNOSIS — E1169 Type 2 diabetes mellitus with other specified complication: Secondary | ICD-10-CM

## 2022-12-10 DIAGNOSIS — Z136 Encounter for screening for cardiovascular disorders: Secondary | ICD-10-CM | POA: Diagnosis not present

## 2022-12-10 DIAGNOSIS — Z1211 Encounter for screening for malignant neoplasm of colon: Secondary | ICD-10-CM

## 2022-12-10 DIAGNOSIS — E559 Vitamin D deficiency, unspecified: Secondary | ICD-10-CM

## 2022-12-10 DIAGNOSIS — E039 Hypothyroidism, unspecified: Secondary | ICD-10-CM

## 2022-12-10 DIAGNOSIS — Z0000 Encounter for general adult medical examination without abnormal findings: Secondary | ICD-10-CM | POA: Diagnosis not present

## 2022-12-10 DIAGNOSIS — E1122 Type 2 diabetes mellitus with diabetic chronic kidney disease: Secondary | ICD-10-CM

## 2022-12-10 DIAGNOSIS — I10 Essential (primary) hypertension: Secondary | ICD-10-CM

## 2022-12-10 DIAGNOSIS — M1A9XX0 Chronic gout, unspecified, without tophus (tophi): Secondary | ICD-10-CM

## 2022-12-10 DIAGNOSIS — N182 Chronic kidney disease, stage 2 (mild): Secondary | ICD-10-CM | POA: Diagnosis not present

## 2022-12-11 LAB — CBC WITH DIFFERENTIAL/PLATELET
Absolute Monocytes: 409 cells/uL (ref 200–950)
Basophils Absolute: 14 cells/uL (ref 0–200)
Basophils Relative: 0.1 %
Eosinophils Absolute: 0 cells/uL — ABNORMAL LOW (ref 15–500)
Eosinophils Relative: 0 %
HCT: 42 % (ref 38.5–50.0)
Hemoglobin: 14.3 g/dL (ref 13.2–17.1)
Lymphs Abs: 1269 cells/uL (ref 850–3900)
MCH: 33.3 pg — ABNORMAL HIGH (ref 27.0–33.0)
MCHC: 34 g/dL (ref 32.0–36.0)
MCV: 97.9 fL (ref 80.0–100.0)
MPV: 10.1 fL (ref 7.5–12.5)
Monocytes Relative: 2.9 %
Neutro Abs: 12408 cells/uL — ABNORMAL HIGH (ref 1500–7800)
Neutrophils Relative %: 88 %
Platelets: 352 10*3/uL (ref 140–400)
RBC: 4.29 10*6/uL (ref 4.20–5.80)
RDW: 14.1 % (ref 11.0–15.0)
Total Lymphocyte: 9 %
WBC: 14.1 10*3/uL — ABNORMAL HIGH (ref 3.8–10.8)

## 2022-12-11 LAB — COMPLETE METABOLIC PANEL WITH GFR
AG Ratio: 1.2 (calc) (ref 1.0–2.5)
ALT: 15 U/L (ref 9–46)
AST: 12 U/L (ref 10–35)
Albumin: 4.1 g/dL (ref 3.6–5.1)
Alkaline phosphatase (APISO): 79 U/L (ref 35–144)
BUN: 17 mg/dL (ref 7–25)
CO2: 26 mmol/L (ref 20–32)
Calcium: 9.9 mg/dL (ref 8.6–10.3)
Chloride: 98 mmol/L (ref 98–110)
Creat: 1.19 mg/dL (ref 0.70–1.28)
Globulin: 3.3 g/dL (calc) (ref 1.9–3.7)
Glucose, Bld: 196 mg/dL — ABNORMAL HIGH (ref 65–99)
Potassium: 3.6 mmol/L (ref 3.5–5.3)
Sodium: 139 mmol/L (ref 135–146)
Total Bilirubin: 0.5 mg/dL (ref 0.2–1.2)
Total Protein: 7.4 g/dL (ref 6.1–8.1)
eGFR: 65 mL/min/{1.73_m2} (ref >=60)

## 2022-12-11 LAB — VITAMIN D 25 HYDROXY (VIT D DEFICIENCY, FRACTURES): Vit D, 25-Hydroxy: 63 ng/mL (ref 30–100)

## 2022-12-11 LAB — URINALYSIS, ROUTINE W REFLEX MICROSCOPIC
Bilirubin Urine: NEGATIVE
Hgb urine dipstick: NEGATIVE
Leukocytes,Ua: NEGATIVE
Nitrite: NEGATIVE
Protein, ur: NEGATIVE
Specific Gravity, Urine: 1.017 (ref 1.001–1.035)
pH: 6 (ref 5.0–8.0)

## 2022-12-11 LAB — LIPID PANEL
Cholesterol: 132 mg/dL (ref <200)
HDL: 46 mg/dL (ref >=40)
LDL Cholesterol (Calc): 64 mg/dL (calc)
Non-HDL Cholesterol (Calc): 86 mg/dL (calc) (ref <130)
Total CHOL/HDL Ratio: 2.9 (calc) (ref <5.0)
Triglycerides: 136 mg/dL (ref <150)

## 2022-12-11 LAB — HEPATIC FUNCTION PANEL
AG Ratio: 1.2 (calc) (ref 1.0–2.5)
ALT: 15 U/L (ref 9–46)
AST: 12 U/L (ref 10–35)
Albumin: 4.1 g/dL (ref 3.6–5.1)
Alkaline phosphatase (APISO): 79 U/L (ref 35–144)
Bilirubin, Direct: 0.1 mg/dL (ref 0.0–0.2)
Globulin: 3.3 g/dL (calc) (ref 1.9–3.7)
Indirect Bilirubin: 0.4 mg/dL (calc) (ref 0.2–1.2)
Total Bilirubin: 0.5 mg/dL (ref 0.2–1.2)
Total Protein: 7.4 g/dL (ref 6.1–8.1)

## 2022-12-11 LAB — MAGNESIUM: Magnesium: 1.4 mg/dL — ABNORMAL LOW (ref 1.5–2.5)

## 2022-12-11 LAB — HEMOGLOBIN A1C W/OUT EAG: Hgb A1c MFr Bld: 7.7 % of total Hgb — ABNORMAL HIGH (ref <5.7)

## 2022-12-11 LAB — PSA: PSA: 0.65 ng/mL (ref <=4.00)

## 2022-12-11 LAB — URIC ACID: Uric Acid, Serum: 5.1 mg/dL (ref 4.0–8.0)

## 2022-12-11 LAB — MICROALBUMIN / CREATININE URINE RATIO
Creatinine, Urine: 104 mg/dL (ref 20–320)
Microalb Creat Ratio: 8 mg/g creat (ref <30)
Microalb, Ur: 0.8 mg/dL

## 2022-12-11 LAB — INSULIN, RANDOM: Insulin: 29 u[IU]/mL — ABNORMAL HIGH

## 2022-12-11 LAB — TSH: TSH: 0.32 mIU/L — ABNORMAL LOW (ref 0.40–4.50)

## 2022-12-12 ENCOUNTER — Encounter: Payer: Self-pay | Admitting: Internal Medicine

## 2022-12-14 NOTE — Progress Notes (Signed)
Patient is aware of last results and instructions. -e welch

## 2022-12-22 ENCOUNTER — Other Ambulatory Visit: Payer: Self-pay

## 2022-12-22 ENCOUNTER — Ambulatory Visit: Payer: Medicare Other | Attending: Cardiology | Admitting: *Deleted

## 2022-12-22 ENCOUNTER — Encounter (HOSPITAL_BASED_OUTPATIENT_CLINIC_OR_DEPARTMENT_OTHER): Payer: Self-pay

## 2022-12-22 ENCOUNTER — Emergency Department (HOSPITAL_BASED_OUTPATIENT_CLINIC_OR_DEPARTMENT_OTHER)
Admission: EM | Admit: 2022-12-22 | Discharge: 2022-12-22 | Disposition: A | Discharge status: Home / Self Care | Payer: Medicare Other | Attending: Emergency Medicine | Admitting: Emergency Medicine

## 2022-12-22 ENCOUNTER — Emergency Department (HOSPITAL_BASED_OUTPATIENT_CLINIC_OR_DEPARTMENT_OTHER): Payer: Medicare Other

## 2022-12-22 DIAGNOSIS — I951 Orthostatic hypotension: Secondary | ICD-10-CM

## 2022-12-22 DIAGNOSIS — E039 Hypothyroidism, unspecified: Secondary | ICD-10-CM | POA: Insufficient documentation

## 2022-12-22 DIAGNOSIS — R531 Weakness: Secondary | ICD-10-CM | POA: Diagnosis not present

## 2022-12-22 DIAGNOSIS — Z5181 Encounter for therapeutic drug level monitoring: Secondary | ICD-10-CM

## 2022-12-22 DIAGNOSIS — D72829 Elevated white blood cell count, unspecified: Secondary | ICD-10-CM | POA: Diagnosis not present

## 2022-12-22 DIAGNOSIS — R42 Dizziness and giddiness: Secondary | ICD-10-CM | POA: Insufficient documentation

## 2022-12-22 DIAGNOSIS — I10 Essential (primary) hypertension: Secondary | ICD-10-CM | POA: Insufficient documentation

## 2022-12-22 DIAGNOSIS — Z7901 Long term (current) use of anticoagulants: Secondary | ICD-10-CM | POA: Diagnosis not present

## 2022-12-22 DIAGNOSIS — Z7984 Long term (current) use of oral hypoglycemic drugs: Secondary | ICD-10-CM | POA: Insufficient documentation

## 2022-12-22 DIAGNOSIS — E119 Type 2 diabetes mellitus without complications: Secondary | ICD-10-CM | POA: Insufficient documentation

## 2022-12-22 DIAGNOSIS — R001 Bradycardia, unspecified: Secondary | ICD-10-CM | POA: Diagnosis not present

## 2022-12-22 DIAGNOSIS — I4819 Other persistent atrial fibrillation: Secondary | ICD-10-CM

## 2022-12-22 DIAGNOSIS — Z79899 Other long term (current) drug therapy: Secondary | ICD-10-CM | POA: Insufficient documentation

## 2022-12-22 DIAGNOSIS — I2510 Atherosclerotic heart disease of native coronary artery without angina pectoris: Secondary | ICD-10-CM | POA: Insufficient documentation

## 2022-12-22 DIAGNOSIS — R9389 Abnormal findings on diagnostic imaging of other specified body structures: Secondary | ICD-10-CM | POA: Diagnosis not present

## 2022-12-22 LAB — URINALYSIS, ROUTINE W REFLEX MICROSCOPIC
Bilirubin Urine: NEGATIVE
Glucose, UA: 100 mg/dL — AB
Hgb urine dipstick: NEGATIVE
Ketones, ur: NEGATIVE mg/dL
Leukocytes,Ua: NEGATIVE
Nitrite: NEGATIVE
Protein, ur: NEGATIVE mg/dL
Specific Gravity, Urine: 1.011 (ref 1.005–1.030)
pH: 6 (ref 5.0–8.0)

## 2022-12-22 LAB — CBC
HCT: 39.3 % (ref 39.0–52.0)
Hemoglobin: 13.4 g/dL (ref 13.0–17.0)
MCH: 33.7 pg (ref 26.0–34.0)
MCHC: 34.1 g/dL (ref 30.0–36.0)
MCV: 98.7 fL (ref 80.0–100.0)
Platelets: 153 10*3/uL (ref 150–400)
RBC: 3.98 MIL/uL — ABNORMAL LOW (ref 4.22–5.81)
RDW: 14.9 % (ref 11.5–15.5)
WBC: 11.7 10*3/uL — ABNORMAL HIGH (ref 4.0–10.5)
nRBC: 0 % (ref 0.0–0.2)

## 2022-12-22 LAB — BASIC METABOLIC PANEL
Anion gap: 11 (ref 5–15)
BUN: 21 mg/dL (ref 8–23)
CO2: 27 mmol/L (ref 22–32)
Calcium: 8.3 mg/dL — ABNORMAL LOW (ref 8.9–10.3)
Chloride: 98 mmol/L (ref 98–111)
Creatinine, Ser: 1.14 mg/dL (ref 0.61–1.24)
GFR, Estimated: >60 mL/min (ref >60)
Glucose, Bld: 194 mg/dL — ABNORMAL HIGH (ref 70–99)
Potassium: 3.1 mmol/L — ABNORMAL LOW (ref 3.5–5.1)
Sodium: 136 mmol/L (ref 135–145)

## 2022-12-22 LAB — POCT INR: INR: 2.5 (ref 2.0–3.0)

## 2022-12-22 LAB — CBG MONITORING, ED: Glucose-Capillary: 200 mg/dL — ABNORMAL HIGH (ref 70–99)

## 2022-12-22 MED ORDER — LACTATED RINGERS IV BOLUS
500.0000 mL | Freq: Once | INTRAVENOUS | Status: AC
  Administered 2022-12-22: 500 mL via INTRAVENOUS

## 2022-12-22 MED ORDER — POTASSIUM CHLORIDE CRYS ER 20 MEQ PO TBCR
40.0000 meq | EXTENDED_RELEASE_TABLET | Freq: Once | ORAL | Status: AC
  Administered 2022-12-22: 40 meq via ORAL
  Filled 2022-12-22: qty 2

## 2022-12-22 MED ORDER — MAGNESIUM SULFATE 2 GM/50ML IV SOLN
2.0000 g | Freq: Once | INTRAVENOUS | Status: AC
  Administered 2022-12-22: 2 g via INTRAVENOUS
  Filled 2022-12-22: qty 50

## 2022-12-22 NOTE — Patient Instructions (Signed)
Description   Continue taking Warfarin 1 tablet daily except 1.5 tablets on Mondays. Stay consistent with greens each week (2-3 times per week). Recheck INR in 4 weeks.  [phone] or [phone] Fax number [phone]

## 2022-12-22 NOTE — Discharge Instructions (Signed)
Your workup was reassuring.  No concerning cause of your lightheadedness.  You were given IV fluids in the emergency with improvement in your symptoms.  Follow-up with your primary care doctor.  For any concerning symptoms return to the emergency room.

## 2022-12-22 NOTE — ED Provider Notes (Signed)
[place] EMERGENCY DEPARTMENT AT [facility] Provider Note   CSN: [id number] Arrival date & time: 12/22/22  1421     History  Chief Complaint  Patient presents with   Weakness   Dizziness    Christopher Wade is a 72 y.o. male.  72 year old male presents with his brother for evaluation of lightheadedness.  States these episodes became more prominent yesterday.  Worse after position change.  Reports compliance with all of his medications.  States he was at the facility to have his INR checked which was 2.5.  He stated afterwards he went to his primary care office to get evaluated but they recommended he come into the emergency department.  Denies headache, vision change, paresthesias, focal weakness.  No chest pain, shortness of breath.  Denies any peripheral edema.  States he drinks about 4 to 516 fluid ounce water bottles per day.  The history is provided by the patient. No language interpreter was used.       Home Medications Prior to Admission medications   Medication Sig Start Date End Date Taking? Authorizing Provider  acetaminophen (TYLENOL) 325 MG tablet Take 325 mg by mouth as needed for mild pain.    [provider]  allopurinol (ZYLOPRIM) 300 MG tablet TAKE 1 TABLET BY MOUTH DAILY FOR GOUT PREVENTION 11/24/22   Raynelle Dick, NP  amLODipine (NORVASC) 10 MG tablet Take 1 tab  Daily For BP & Heart 08/27/22   [name], NP  Ascorbic Acid (VITAMIN C WITH ROSE HIPS) 500 MG tablet Take 500 mg by mouth daily.    [provider]  atorvastatin (LIPITOR) 80 MG tablet TAKE 1/2 TABLET BY MOUTH DAILY 11/24/22   Raynelle Dick, NP  B Complex Vitamins (B COMPLEX PO) Take 1 tablet by mouth daily.    [provider]  carvedilol (COREG) 12.5 MG tablet TAKE 1 TABLET(12.5 MG) BY MOUTH TWICE DAILY WITH A MEAL 11/24/22   Raynelle Dick, NP  cholecalciferol (VITAMIN D) 1000 UNITS tablet Take 2,000 Units by mouth daily.    [provider]   dexamethasone (DECADRON) 4 MG tablet Take 1 tab 3 x /day for 2 days,      then 2 x /day for 2  Days,     then 1 tab daily 12/03/22   [name], MD  Ferrous Sulfate (IRON) 325 (65 Fe) MG TABS Take 1 tablet (325 mg total) by mouth every other day. 08/16/21   Judd Gaudier, NP  furosemide (LASIX) 40 MG tablet TAKE 1 TABLET BY MOUTH 1 TO 2 TIMES PER DAY AS NEEDED FOR FLUID RETENTION OR ANKLE SWELLING 08/27/22   [name], NP  levothyroxine (SYNTHROID) 100 MCG tablet Take  1 tablet  Daily  on an empty stomach with only water for 30 minutes & no Antacid meds, Calcium or Magnesium for 4 hours & avoid Biotin                                                                                 /  TAKE                                                     BY                                                  MOUTH 08/27/22   [name], NP  loratadine (CLARITIN) 10 MG tablet Take 10 mg by mouth daily.    [provider]  magnesium gluconate (MAGONATE) 500 MG tablet Take 500mg  TID and with meals. 08/19/21   Judd Gaudier, NP  metFORMIN (GLUCOPHAGE-XR) 500 MG 24 hr tablet Take 2 tablets in the morning and 2 tablets in the evening. 08/31/22   [name], NP  olmesartan (BENICAR) 40 MG tablet TAKE 1 TABLET BY MOUTH DAILY FOR BLOOD PRESSURE 11/24/22   Raynelle Dick, NP  Omega-3 Fatty Acids (FISH OIL) 1200 MG CAPS Take 1,200 mg by mouth daily.    [provider]  oxyCODONE-acetaminophen (PERCOCET/ROXICET) 5-325 MG tablet Take 1 tablet by mouth every 8 (eight) hours as needed for severe pain. 11/28/22   Mesner, Barbara Cower, MD  pantoprazole (PROTONIX) 40 MG tablet TAKE 1 TABLET BY MOUTH EVERY DAY BEFORE BREAKFAST 08/27/22   [name], NP  potassium chloride SA (KLOR-CON M) 20 MEQ tablet Take 2 tablets (40 mEq total) by mouth daily for 7 days. 11/28/22 12/05/22  Mesner, Barbara Cower, MD  warfarin (COUMADIN) 5 MG tablet TAKE 1 TABLET BY MOUTH DAILY. EXCEPT 1 1/2  TABLETS ON MONDAYS OR AS DIRECTED BY COAGULATION CLINIC 12/08/22   [name], MD  zinc gluconate 50 MG tablet Take 50 mg by mouth daily.    [provider]      Allergies    Amiodarone and Bee venom    Review of Systems   Review of Systems  Constitutional:  Negative for fever.  Eyes:  Negative for visual disturbance.  Respiratory:  Negative for shortness of breath.   Cardiovascular:  Negative for chest pain and leg swelling.  Gastrointestinal:  Negative for abdominal pain and nausea.  Genitourinary:  Negative for dysuria.  Neurological:  Positive for light-headedness. Negative for syncope, weakness and headaches.  All other systems reviewed and are negative.   Physical Exam Updated Vital Signs BP 117/65   Pulse (!) 59   Temp 98 F (36.7 C) (Oral)   Resp 18   Ht 5\' 10"  (1.778 m)   Wt 107.5 kg   SpO2 96%   BMI 34.01 kg/m  Physical Exam Vitals and nursing note reviewed.  Constitutional:      General: He is not in acute distress.    Appearance: Normal appearance. He is not ill-appearing.  HENT:     Head: Normocephalic and atraumatic.     Nose: Nose normal.  Eyes:     Conjunctiva/sclera: Conjunctivae normal.  Cardiovascular:     Rate and Rhythm: Bradycardia present.  Pulmonary:     Effort: Pulmonary effort is normal. No respiratory distress.     Breath sounds: Normal breath sounds. No wheezing or rales.  Abdominal:     General: There is no distension.     Palpations: Abdomen is soft.     Tenderness: There is  no abdominal tenderness. There is no guarding.  Musculoskeletal:        General: No deformity. Normal range of motion.     Cervical back: Normal range of motion.     Right lower leg: No edema.     Left lower leg: No edema.  Skin:    Findings: No rash.  Neurological:     Mental Status: He is alert.    ED Results / Procedures / Treatments   Labs (all labs ordered are listed, but only abnormal results are displayed) Labs Reviewed  BASIC  METABOLIC PANEL - Abnormal; Notable for the following components:      Result Value   Potassium 3.1 (*)    Glucose, Bld 194 (*)    Calcium 8.3 (*)    All other components within normal limits  CBC - Abnormal; Notable for the following components:   WBC 11.7 (*)    RBC 3.98 (*)    All other components within normal limits  CBG MONITORING, ED - Abnormal; Notable for the following components:   Glucose-Capillary 200 (*)    All other components within normal limits  URINALYSIS, ROUTINE W REFLEX MICROSCOPIC    EKG EKG Interpretation Date/Time:  Tuesday December 22 2022 14:34:55 EDT Ventricular Rate:  60 PR Interval:  158 QRS Duration:  86 QT Interval:  432 QTC Calculation: 432 R Axis:   32  Text Interpretation: Normal sinus rhythm Confirmed by Gloris Manchester [phone]) on 12/22/2022 4:19:34 PM  Radiology No results found.  Procedures Procedures    Medications Ordered in ED Medications  magnesium sulfate IVPB 2 g 50 mL (2 g Intravenous New Bag/Given 12/22/22 1654)  lactated ringers bolus 500 mL (500 mLs Intravenous New Bag/Given 12/22/22 1653)  potassium chloride SA (KLOR-CON M) CR tablet 40 mEq (40 mEq Oral Given 12/22/22 1654)    ED Course/ Medical Decision Making/ A&P                                 Medical Decision Making Amount and/or Complexity of Data Reviewed Labs: ordered. Radiology: ordered.  Risk Prescription drug management.   Medical Decision Making / ED Course   This patient presents to the ED for concern of lightheadedness, this involves an extensive number of treatment options, and is a complaint that carries with it a high risk of complications and morbidity.  The differential diagnosis includes orthostasis, dehydration, infection  MDM: 72 year old male with past medical history as above presents today for evaluation of lightheadedness.  Worse with position change.  Denies cough, shortness of breath, chest pain, dysuria.  CBC reveals mild leukocytosis but no  anemia.  BMP reveals potassium of 3.1, glucose of 194 but no other acute findings.  UA without evidence of UTI.  Chest x-ray without evidence of pneumonia or other acute finding.  Potassium repletion and 2 g of magnesium provided in the emergency department.  EKG without acute ischemic changes.  Patient placed on telemetry.  Sinus bradycardia.  He received 500 mL fluid bolus with resolution of his lightheadedness.  We discussed increasing his hydration and following up with PCP.  No concerning cause of lightheadedness identified.   Lab Tests: -I ordered, reviewed, and interpreted labs.   The pertinent results include:   Labs Reviewed  BASIC METABOLIC PANEL - Abnormal; Notable for the following components:      Result Value   Potassium 3.1 (*)    Glucose, Bld 194 (*)  Calcium 8.3 (*)    All other components within normal limits  CBC - Abnormal; Notable for the following components:   WBC 11.7 (*)    RBC 3.98 (*)    All other components within normal limits  URINALYSIS, ROUTINE W REFLEX MICROSCOPIC - Abnormal; Notable for the following components:   Glucose, UA 100 (*)    All other components within normal limits  CBG MONITORING, ED - Abnormal; Notable for the following components:   Glucose-Capillary 200 (*)    All other components within normal limits      EKG  EKG Interpretation Date/Time:  Tuesday December 22 2022 14:34:55 EDT Ventricular Rate:  60 PR Interval:  158 QRS Duration:  86 QT Interval:  432 QTC Calculation: 432 R Axis:   32  Text Interpretation: Normal sinus rhythm Confirmed by Gloris Manchester ([id number]) on 12/22/2022 4:19:34 PM         Imaging Studies ordered: I ordered imaging studies including chest x-ray I independently visualized and interpreted imaging. I agree with the radiologist interpretation   Medicines ordered and prescription drug management: Meds ordered this encounter  Medications   lactated ringers bolus 500 mL   magnesium sulfate IVPB 2 g 50 mL    potassium chloride SA (KLOR-CON M) CR tablet 40 mEq    -I have reviewed the patients home medicines and have made adjustments as needed  Critical interventions Fluids   Cardiac Monitoring: The patient was maintained on a cardiac monitor.  I personally viewed and interpreted the cardiac monitored which showed an underlying rhythm of: Sinus bradycardia   Reevaluation: After the interventions noted above, I reevaluated the patient and found that they have :resolved  Co morbidities that complicate the patient evaluation  Past Medical History:  Diagnosis Date   AKI (acute kidney injury) (HCC) 05/27/2020   Arthritis    "joints get stiff" (06/11/2016)   Bee sting allergy    Bee sting allergy    CAD (coronary artery disease), native coronary artery 02/12/2016   cath showing 50% LM, 40% distal LAD, 70% ost to prox LCs, 40% mid LAD and 95% mid to dist RCA s/p PCI with DES x 2 and stage PCI of the LCx/OM.   Carotid artery stenosis    1-39% bilateral carotid stenosis by dopples 06/2022   Duodenal ulcer    History of gout    Hx of adenomatous colonic polyps 04/10/2008   Hyperlipidemia    Hypertension    Hypothyroidism    OSA (obstructive sleep apnea)    "tried CPAP; couldn't do it" (06/11/2016)   Persistent atrial fibrillation (HCC)    s/p DCCV 03/2015 with reversion back to atrial fibrillation, loaded with Amio and s/p DCCV to NSR 11/2015.  He is now s/p afib albation.  He had amio lung toxicity and amio was stopped.   Subclavian artery stenosis (HCC)    right subclavian artery stenosis by dopplers 2/24   Thyroid disease    Type II diabetes mellitus (HCC)    Vitamin D deficiency       Dispostion: Patient discharged in stable condition.  Return precaution discussed.  Patient voices understanding and is in agreement with the plan.  Brother is at bedside and is also in agreement.  Final Clinical Impression(s) / ED Diagnoses Final diagnoses:  Orthostasis    Rx / DC Orders ED  Discharge Orders     None         Marita Kansas, PA-C 12/22/22 1959    Gloris Manchester,  MD 12/22/22 2332

## 2022-12-22 NOTE — ED Notes (Signed)
Attempt UA unable to go at this time.

## 2022-12-22 NOTE — ED Notes (Signed)
Pt verbalized understanding of d/c instructions, meds, and followup care. Denies questions. VSS, no distress noted. Steady gait to exit with all belongings.  ?

## 2022-12-22 NOTE — ED Triage Notes (Signed)
Pt to ED c/o dizziness and weakness x 3 days. Pt alert and oriented in triage.

## 2022-12-29 ENCOUNTER — Telehealth: Payer: Self-pay | Admitting: *Deleted

## 2022-12-29 NOTE — Telephone Encounter (Signed)
Transition Care Management Unsuccessful Follow-up Telephone Call  Date of discharge and from where:  [facility] 12/22/2022  Attempts:  1st Attempt  Reason for unsuccessful TCM follow-up call:  No answer/busy

## 2022-12-30 ENCOUNTER — Encounter: Payer: Self-pay | Admitting: Nurse Practitioner

## 2022-12-30 ENCOUNTER — Ambulatory Visit (INDEPENDENT_AMBULATORY_CARE_PROVIDER_SITE_OTHER): Payer: Medicare Other | Admitting: Nurse Practitioner

## 2022-12-30 VITALS — Temp 98.0°F | Ht 70.0 in | Wt 230.0 lb

## 2022-12-30 DIAGNOSIS — N182 Chronic kidney disease, stage 2 (mild): Secondary | ICD-10-CM

## 2022-12-30 DIAGNOSIS — R531 Weakness: Secondary | ICD-10-CM

## 2022-12-30 DIAGNOSIS — I10 Essential (primary) hypertension: Secondary | ICD-10-CM | POA: Diagnosis not present

## 2022-12-30 DIAGNOSIS — Z79899 Other long term (current) drug therapy: Secondary | ICD-10-CM

## 2022-12-30 DIAGNOSIS — I951 Orthostatic hypotension: Secondary | ICD-10-CM

## 2022-12-30 DIAGNOSIS — R42 Dizziness and giddiness: Secondary | ICD-10-CM

## 2022-12-30 DIAGNOSIS — E876 Hypokalemia: Secondary | ICD-10-CM

## 2022-12-30 DIAGNOSIS — Z09 Encounter for follow-up examination after completed treatment for conditions other than malignant neoplasm: Secondary | ICD-10-CM | POA: Diagnosis not present

## 2022-12-30 DIAGNOSIS — E1122 Type 2 diabetes mellitus with diabetic chronic kidney disease: Secondary | ICD-10-CM

## 2022-12-30 LAB — CBC WITH DIFFERENTIAL/PLATELET
Absolute Monocytes: 573 cells/uL (ref 200–950)
Basophils Absolute: 28 cells/uL (ref 0–200)
Basophils Relative: 0.4 %
Eosinophils Absolute: 117 cells/uL (ref 15–500)
Eosinophils Relative: 1.7 %
HCT: 42.2 % (ref 38.5–50.0)
Hemoglobin: 14.5 g/dL (ref 13.2–17.1)
Lymphs Abs: 1649 cells/uL (ref 850–3900)
MCH: 34.4 pg — ABNORMAL HIGH (ref 27.0–33.0)
MCHC: 34.4 g/dL (ref 32.0–36.0)
MCV: 100 fL (ref 80.0–100.0)
MPV: 10.1 fL (ref 7.5–12.5)
Monocytes Relative: 8.3 %
Neutro Abs: 4533 cells/uL (ref 1500–7800)
Neutrophils Relative %: 65.7 %
Platelets: 282 10*3/uL (ref 140–400)
RBC: 4.22 10*6/uL (ref 4.20–5.80)
RDW: 13.8 % (ref 11.0–15.0)
Total Lymphocyte: 23.9 %
WBC: 6.9 10*3/uL (ref 3.8–10.8)

## 2022-12-30 NOTE — Progress Notes (Signed)
Hospital follow up  Assessment and Plan: Hospital visit follow up for:   Hospital discharge follow-up Reviewed discharge instructions in full including medication changes, diagnostics, labs, and future follow ups appointment. All questions and concerns addressed.   Weakness Discussed importance of staying well hydrated and consuming a well balanced diet.    - CBC with Differential/Platelet - COMPLETE METABOLIC PANEL WITH GFR  Dizziness Change positions slowly Continue use of walker/cane Stay well hydrated  - CBC with Differential/Platelet - COMPLETE METABOLIC PANEL WITH GFR  Hypokalemia Monitor levels  - COMPLETE METABOLIC PANEL WITH GFR  Essential hypertension Discussed DASH (Dietary Approaches to Stop Hypertension) DASH diet is lower in sodium than a typical American diet. Cut back on foods that are high in saturated fat, cholesterol, and trans fats. Eat more whole-grain foods, fish, poultry, and nuts Remain active and exercise as tolerated daily.  Monitor BP at home-Call if greater than 130/80.  Check CMP/CBC  - CBC with Differential/Platelet - COMPLETE METABOLIC PANEL WITH GFR  Type 2 diabetes mellitus with stage 2 chronic kidney disease, without long-term current use of insulin [facility]) Education: Reviewed 'ABCs' of diabetes management  Discussed goals to be met and/or maintained include A1C (<7) Blood pressure (<130/80) Cholesterol (LDL <70) Continue Eye Exam yearly  Continue Dental Exam Q6 mo Discussed dietary recommendations Discussed Physical Activity recommendations Check A1C  - COMPLETE METABOLIC PANEL WITH GFR  Medication management All medications discussed and reviewed in full. All questions and concerns regarding medications addressed.    Orthostatic hypotension Discussed dizziness can be related to postural changes causing BP to drop and increase for falls. Continue to monitor BP in the home - contact office if average is <90/60. Ossible  medicaiton changes once BP log has been reviwed. Stay well hydrated to promote vasodilation and profusion.  Orders Placed This Encounter  Procedures   CBC with Differential/Platelet   COMPLETE METABOLIC PANEL WITH GFR   All medications were reviewed with patient and family and fully reconciled. All questions answered fully, and patient and family members were encouraged to call the office with any further questions or concerns. Discussed goal to avoid readmission related to this diagnosis.   Over 20 minutes of exam, counseling, chart review, and complex, high/moderate level critical decision making was performed this visit.   Future Appointments  Date Time Provider Department Center  01/19/2023  1:15 PM [facility] None  03/17/2023  2:30 PM [name], NP [facility] None  06/17/2023  3:30 PM [name], MD [facility] None  09/16/2023  2:30 PM [name], NP [facility] None  12/22/2023  3:00 PM [name], MD [facility] None     HPI 72 y.o.male presents for follow up for transition from recent hospitalization or SNIF stay. Admit date to the hospital was 12/22/22, patient was discharged from the hospital on 12/22/22 and our clinical staff contacted the office the day after discharge to set up a follow up appointment. The discharge summary, medications, and diagnostic test results were reviewed before meeting with the patient. The patient was admitted for weakness and dizziness.  72 year old male presented with his brother for evaluation of lightheadedness.  Stated these episodes became more prominent over time  Worse after position change.  Reports compliance with all of his medications.  States he was at the facility to have his INR checked which was 2.5.  He stated afterwards he went to his primary care office to get evaluated but they recommended he come into the emergency department.  Denied  headache, vision change, paresthesias, focal weakness.   No chest pain, shortness of breath.  Denied any peripheral edema.    Today he reports that overall he is feeling well.  He does not feel as though he is experiencing dizziness or weakness.  He is focusing on consuming fluids to stay well hydrated.  He is using a crutch for support.  BP is stable although he is positive for orthostatics.  Images while in the hospital: DG Chest Portable 1 View  Result Date: 12/22/2022 CLINICAL DATA:  Pneumonia EXAM: PORTABLE CHEST 1 VIEW COMPARISON:  05/27/2020 x-ray FINDINGS: Enlarged cardiopericardial silhouette. Slight elevation of the right hemidiaphragm. No consolidation, pneumothorax or effusion. No edema. Degenerative changes of the spine. IMPRESSION: Enlarged cardiopericardial silhouette. Slight elevation of the right hemidiaphragm. Electronically Signed   By: Karen Kays M.D.   On: 12/22/2022 17:04     Current Outpatient Medications (Endocrine & Metabolic):    dexamethasone (DECADRON) 4 MG tablet, Take 1 tab 3 x /day for 2 days,      then 2 x /day for 2  Days,     then 1 tab daily   levothyroxine (SYNTHROID) 100 MCG tablet, Take  1 tablet  Daily  on an empty stomach with only water for 30 minutes & no Antacid meds, Calcium or Magnesium for 4 hours & avoid Biotin                                                                                 /                                         TAKE                                                     BY                                                  MOUTH   metFORMIN (GLUCOPHAGE-XR) 500 MG 24 hr tablet, Take 2 tablets in the morning and 2 tablets in the evening.  Current Outpatient Medications (Cardiovascular):    amLODipine (NORVASC) 10 MG tablet, Take 1 tab  Daily For BP & Heart   atorvastatin (LIPITOR) 80 MG tablet, TAKE 1/2 TABLET BY MOUTH DAILY   carvedilol (COREG) 12.5 MG tablet, TAKE 1 TABLET(12.5 MG) BY MOUTH TWICE DAILY WITH A MEAL   furosemide (LASIX) 40 MG tablet, TAKE 1 TABLET BY MOUTH 1 TO 2 TIMES PER  DAY AS NEEDED FOR FLUID RETENTION OR ANKLE SWELLING   olmesartan (BENICAR) 40 MG tablet, TAKE 1 TABLET BY MOUTH DAILY FOR BLOOD PRESSURE  Current Outpatient Medications (Respiratory):    loratadine (CLARITIN) 10 MG tablet, Take 10 mg by mouth daily.  Current Outpatient Medications (Analgesics):  acetaminophen (TYLENOL) 325 MG tablet, Take 325 mg by mouth as needed for mild pain.   allopurinol (ZYLOPRIM) 300 MG tablet, TAKE 1 TABLET BY MOUTH DAILY FOR GOUT PREVENTION   oxyCODONE-acetaminophen (PERCOCET/ROXICET) 5-325 MG tablet, Take 1 tablet by mouth every 8 (eight) hours as needed for severe pain.  Current Outpatient Medications (Hematological):    Ferrous Sulfate (IRON) 325 (65 Fe) MG TABS, Take 1 tablet (325 mg total) by mouth every other day.   warfarin (COUMADIN) 5 MG tablet, TAKE 1 TABLET BY MOUTH DAILY. EXCEPT 1 1/2 TABLETS ON MONDAYS OR AS DIRECTED BY COAGULATION CLINIC  Current Outpatient Medications (Other):    Ascorbic Acid (VITAMIN C WITH ROSE HIPS) 500 MG tablet, Take 500 mg by mouth daily.   B Complex Vitamins (B COMPLEX PO), Take 1 tablet by mouth daily.   cholecalciferol (VITAMIN D) 1000 UNITS tablet, Take 2,000 Units by mouth daily.   magnesium gluconate (MAGONATE) 500 MG tablet, Take 500mg  TID and with meals.   Omega-3 Fatty Acids (FISH OIL) 1200 MG CAPS, Take 1,200 mg by mouth daily.   pantoprazole (PROTONIX) 40 MG tablet, TAKE 1 TABLET BY MOUTH EVERY DAY BEFORE BREAKFAST   zinc gluconate 50 MG tablet, Take 50 mg by mouth daily.   potassium chloride SA (KLOR-CON M) 20 MEQ tablet, Take 2 tablets (40 mEq total) by mouth daily for 7 days.  Past Medical History:  Diagnosis Date   AKI (acute kidney injury) (HCC) 05/27/2020   Arthritis    "joints get stiff" (06/11/2016)   Bee sting allergy    Bee sting allergy    CAD (coronary artery disease), native coronary artery 02/12/2016   cath showing 50% LM, 40% distal LAD, 70% ost to prox LCs, 40% mid LAD and 95% mid to dist RCA  s/p PCI with DES x 2 and stage PCI of the LCx/OM.   Carotid artery stenosis    1-39% bilateral carotid stenosis by dopples 06/2022   Duodenal ulcer    History of gout    Hx of adenomatous colonic polyps 04/10/2008   Hyperlipidemia    Hypertension    Hypothyroidism    OSA (obstructive sleep apnea)    "tried CPAP; couldn't do it" (06/11/2016)   Persistent atrial fibrillation (HCC)    s/p DCCV 03/2015 with reversion back to atrial fibrillation, loaded with Amio and s/p DCCV to NSR 11/2015.  He is now s/p afib albation.  He had amio lung toxicity and amio was stopped.   Subclavian artery stenosis (HCC)    right subclavian artery stenosis by dopplers 2/24   Thyroid disease    Type II diabetes mellitus (HCC)    Vitamin D deficiency      Allergies  Allergen Reactions   Amiodarone Other (See Comments)    Lung toxicity   Bee Venom Other (See Comments)    Leg went numb    ROS: all negative except above.   Physical Exam: Filed Weights   12/30/22 1531  Weight: 230 lb (104.3 kg)   Temp 98 F (36.7 C)   Ht 5\' 10"  (1.778 m)   Wt 230 lb (104.3 kg)   SpO2 97%   BMI 33.00 kg/m  General Appearance: Well nourished, in no apparent distress. Eyes: PERRLA, EOMs, conjunctiva no swelling or erythema Sinuses: No Frontal/maxillary tenderness ENT/Mouth: Ext aud canals clear, TMs without erythema, bulging. No erythema, swelling, or exudate on post pharynx.  Tonsils not swollen or erythematous. Hearing normal.  Neck: Supple, thyroid normal.  Respiratory: Respiratory  effort normal, BS equal bilaterally without rales, rhonchi, wheezing or stridor.  Cardio: RRR with no MRGs. Brisk peripheral pulses without edema.  Abdomen: Soft, + BS.  Non tender, no guarding, rebound, hernias, masses. Lymphatics: Non tender without lymphadenopathy.  Musculoskeletal: Full ROM, 5/5 strength, normal gait.  Skin: Warm, dry without rashes, lesions, ecchymosis.  Neuro: Cranial nerves intact. Normal muscle tone, no  cerebellar symptoms. Sensation intact.  Psych: Awake and oriented X 3, normal affect, Insight and Judgment appropriate.     [name], NP 4:00 PM [facility]

## 2022-12-30 NOTE — Patient Instructions (Signed)
Orthostatic Hypotension Blood pressure is a measurement of how strongly, or weakly, your circulating blood is pressing against the walls of your arteries. Orthostatic hypotension is a drop in blood pressure that can happen when you change positions, such as when you go from lying down to standing. Arteries are blood vessels that carry blood from your heart throughout your body. When blood pressure is too low, you may not get enough blood to your brain or to the rest of your organs. Orthostatic hypotension can cause light-headedness, sweating, rapid heartbeat, blurred vision, and fainting. These symptoms require further investigation into the cause. What are the causes? Orthostatic hypotension can be caused by many things, including: Sudden changes in posture, such as standing up quickly after you have been sitting or lying down. Loss of blood (anemia) or loss of body fluids (dehydration). Heart problems, neurologic problems, or hormone problems. Pregnancy. Aging. The risk for this condition increases as you get older. Severe infection (sepsis). Certain medicines, such as medicines for high blood pressure or medicines that make the body lose excess fluids (diuretics). What are the signs or symptoms? Symptoms of this condition may include: Weakness, light-headedness, or dizziness. Sweating. Blurred vision. Tiredness (fatigue). Rapid heartbeat. Fainting, in severe cases. How is this diagnosed? This condition is diagnosed based on: Your symptoms and medical history. Your blood pressure measurements. Your health care provider will check your blood pressure when you are: Lying down. Sitting. Standing. A blood pressure reading is recorded as two numbers, such as "120 over 80" (or 120/80). The first ("top") number is called the systolic pressure. It is a measure of the pressure in your arteries as your heart beats. The second ("bottom") number is called the diastolic pressure. It is a measure of  the pressure in your arteries when your heart relaxes between beats. Blood pressure is measured in a unit called mmHg. Healthy blood pressure for most adults is 120/80 mmHg. Orthostatic hypotension is defined as a 20 mmHg drop in systolic pressure or a 10 mmHg drop in diastolic pressure within 3 minutes of standing. Other information or tests that may be used to diagnose orthostatic hypotension include: Your other vital signs, such as your heart rate and temperature. Blood tests. An electrocardiogram (ECG) or echocardiogram. A Holter monitor. This is a device you wear that records your heart rhythm continuously, usually for 24-48 hours. Tilt table test. For this test, you will be safely secured to a table that moves you from a lying position to an upright position. Your heart rhythm and blood pressure will be monitored during the test. How is this treated? This condition may be treated by: Changing your diet. This may involve eating more salt (sodium) or drinking more water. Changing the dosage of certain medicines you are taking that might be lowering your blood pressure. Correcting the underlying reason for the orthostatic hypotension. Wearing compression stockings. Taking medicines to raise your blood pressure. Avoiding actions that trigger symptoms. Follow these instructions at home: Medicines Take over-the-counter and prescription medicines only as told by your health care provider. Follow instructions from your health care provider about changing the dosage of your current medicines, if this applies. Do not stop or adjust any of your medicines on your own. Eating and drinking  Drink enough fluid to keep your urine pale yellow. Eat extra salt only as directed. Do not add extra salt to your diet unless advised by your health care provider. Eat frequent, small meals. Avoid standing up suddenly after eating. General instructions    Get up slowly from lying down or sitting positions. This  gives your blood pressure a chance to adjust. Avoid hot showers and excessive heat as directed by your health care provider. Engage in regular physical activity as directed by your health care provider. If you have compression stockings, wear them as told. Keep all follow-up visits. This is important. Contact a health care provider if: You have a fever for more than 2-3 days. You feel more thirsty than usual. You feel dizzy or weak. Get help right away if: You have chest pain. You have a fast or irregular heartbeat. You become sweaty or feel light-headed. You feel short of breath. You faint. You have any symptoms of a stroke. "BE FAST" is an easy way to remember the main warning signs of a stroke: B - Balance. Signs are dizziness, sudden trouble walking, or loss of balance. E - Eyes. Signs are trouble seeing or a sudden change in vision. F - Face. Signs are sudden weakness or numbness of the face, or the face or eyelid drooping on one side. A - Arms. Signs are weakness or numbness in an arm. This happens suddenly and usually on one side of the body. S - Speech. Signs are sudden trouble speaking, slurred speech, or trouble understanding what people say. T - Time. Time to call emergency services. Write down what time symptoms started. You have other signs of a stroke, such as: A sudden, severe headache with no known cause. Nausea or vomiting. Seizure. These symptoms may represent a serious problem that is an emergency. Do not wait to see if the symptoms will go away. Get medical help right away. Call your local emergency services (911 in the [place] Do not drive yourself to the hospital. Summary Orthostatic hypotension is a sudden drop in blood pressure. It can cause light-headedness, sweating, rapid heartbeat, blurred vision, and fainting. Orthostatic hypotension can be diagnosed by having your blood pressure taken while lying down, sitting, and then standing. Treatment may involve  changing your diet, wearing compression stockings, sitting up slowly, adjusting your medicines, or correcting the underlying reason for the orthostatic hypotension. Get help right away if you have chest pain, a fast or irregular heartbeat, or symptoms of a stroke. This information is not intended to replace advice given to you by your health care provider. Make sure you discuss any questions you have with your health care provider. Document Revised: 07/25/2020 Document Reviewed: 07/25/2020 Elsevier Patient Education  2024 [employer]  

## 2023-01-04 ENCOUNTER — Telehealth: Payer: Self-pay

## 2023-01-04 NOTE — Telephone Encounter (Signed)
Patient's blood pressure has averaged out to 150/77. Highest systolic was 179 and lowest diastolic was 66.  Please advise

## 2023-01-04 NOTE — Telephone Encounter (Signed)
Will continue to monitor and let us know if his BP stays over the 150's. Will  call if any questions or concerns.

## 2023-01-19 ENCOUNTER — Ambulatory Visit: Payer: Medicare Other | Attending: Cardiovascular Disease

## 2023-01-19 DIAGNOSIS — Z5181 Encounter for therapeutic drug level monitoring: Secondary | ICD-10-CM | POA: Diagnosis not present

## 2023-01-19 DIAGNOSIS — I4819 Other persistent atrial fibrillation: Secondary | ICD-10-CM

## 2023-01-19 LAB — POCT INR: INR: 3.6 — AB (ref 2.0–3.0)

## 2023-01-19 NOTE — Patient Instructions (Signed)
HOLD TODAY ONLY THEN Continue taking Warfarin 1 tablet daily except 1.5 tablets on Mondays. Stay consistent with greens each week (2-3 times per week). Recheck INR in 4 weeks.  Cou[facility]in Clinic:[phone] or [phone] Fax number [phone]

## 2023-02-08 ENCOUNTER — Other Ambulatory Visit: Payer: Self-pay | Admitting: Nurse Practitioner

## 2023-02-16 ENCOUNTER — Ambulatory Visit: Payer: Medicare Other | Attending: Cardiovascular Disease

## 2023-02-16 DIAGNOSIS — Z5181 Encounter for therapeutic drug level monitoring: Secondary | ICD-10-CM | POA: Diagnosis not present

## 2023-02-16 DIAGNOSIS — I4819 Other persistent atrial fibrillation: Secondary | ICD-10-CM

## 2023-02-16 LAB — POCT INR: INR: 1.9 — AB (ref 2.0–3.0)

## 2023-02-16 NOTE — Patient Instructions (Signed)
TAKE 1.5 TABLETS TODAY ONLY THEN Continue taking Warfarin 1 tablet daily except 1.5 tablets on Mondays. Stay consistent with greens each week (2-3 times per week). Recheck INR in 4 weeks.  Coumadin Clinic:[phone] or [phone] Fax number [phone]

## 2023-02-22 ENCOUNTER — Other Ambulatory Visit: Payer: Self-pay | Admitting: Nurse Practitioner

## 2023-02-22 DIAGNOSIS — I10 Essential (primary) hypertension: Secondary | ICD-10-CM

## 2023-03-11 DIAGNOSIS — H02831 Dermatochalasis of right upper eyelid: Secondary | ICD-10-CM | POA: Diagnosis not present

## 2023-03-11 DIAGNOSIS — E119 Type 2 diabetes mellitus without complications: Secondary | ICD-10-CM | POA: Diagnosis not present

## 2023-03-11 DIAGNOSIS — H04123 Dry eye syndrome of bilateral lacrimal glands: Secondary | ICD-10-CM | POA: Diagnosis not present

## 2023-03-11 DIAGNOSIS — H02834 Dermatochalasis of left upper eyelid: Secondary | ICD-10-CM | POA: Diagnosis not present

## 2023-03-11 LAB — HM DIABETES EYE EXAM

## 2023-03-17 ENCOUNTER — Encounter: Payer: Self-pay | Admitting: Nurse Practitioner

## 2023-03-17 ENCOUNTER — Encounter: Payer: Self-pay | Admitting: Internal Medicine

## 2023-03-17 ENCOUNTER — Ambulatory Visit (INDEPENDENT_AMBULATORY_CARE_PROVIDER_SITE_OTHER): Payer: Medicare Other | Admitting: Nurse Practitioner

## 2023-03-17 ENCOUNTER — Ambulatory Visit: Payer: Medicare Other | Attending: Cardiovascular Disease | Admitting: *Deleted

## 2023-03-17 VITALS — BP 122/60 | HR 71 | Temp 98.0°F | Ht 70.0 in | Wt 246.0 lb

## 2023-03-17 DIAGNOSIS — G4733 Obstructive sleep apnea (adult) (pediatric): Secondary | ICD-10-CM

## 2023-03-17 DIAGNOSIS — E1122 Type 2 diabetes mellitus with diabetic chronic kidney disease: Secondary | ICD-10-CM

## 2023-03-17 DIAGNOSIS — J849 Interstitial pulmonary disease, unspecified: Secondary | ICD-10-CM

## 2023-03-17 DIAGNOSIS — I2510 Atherosclerotic heart disease of native coronary artery without angina pectoris: Secondary | ICD-10-CM

## 2023-03-17 DIAGNOSIS — I10 Essential (primary) hypertension: Secondary | ICD-10-CM | POA: Diagnosis not present

## 2023-03-17 DIAGNOSIS — R609 Edema, unspecified: Secondary | ICD-10-CM | POA: Diagnosis not present

## 2023-03-17 DIAGNOSIS — I4819 Other persistent atrial fibrillation: Secondary | ICD-10-CM | POA: Diagnosis not present

## 2023-03-17 DIAGNOSIS — E039 Hypothyroidism, unspecified: Secondary | ICD-10-CM

## 2023-03-17 DIAGNOSIS — Z5181 Encounter for therapeutic drug level monitoring: Secondary | ICD-10-CM

## 2023-03-17 DIAGNOSIS — Z860101 Personal history of adenomatous and serrated colon polyps: Secondary | ICD-10-CM

## 2023-03-17 DIAGNOSIS — E559 Vitamin D deficiency, unspecified: Secondary | ICD-10-CM

## 2023-03-17 DIAGNOSIS — N182 Chronic kidney disease, stage 2 (mild): Secondary | ICD-10-CM

## 2023-03-17 DIAGNOSIS — E1169 Type 2 diabetes mellitus with other specified complication: Secondary | ICD-10-CM

## 2023-03-17 DIAGNOSIS — K2270 Barrett's esophagus without dysplasia: Secondary | ICD-10-CM

## 2023-03-17 DIAGNOSIS — Z79899 Other long term (current) drug therapy: Secondary | ICD-10-CM

## 2023-03-17 DIAGNOSIS — M1A9XX0 Chronic gout, unspecified, without tophus (tophi): Secondary | ICD-10-CM

## 2023-03-17 DIAGNOSIS — E785 Hyperlipidemia, unspecified: Secondary | ICD-10-CM

## 2023-03-17 LAB — POCT INR: INR: 2 (ref 2.0–3.0)

## 2023-03-17 NOTE — Patient Instructions (Signed)

## 2023-03-17 NOTE — Progress Notes (Signed)
FOLLOW UP  Assessment:   Christopher Wade was seen today for medicare wellness.  Diagnoses and all orders for this visit:  Essential hypertension Discussed DASH (Dietary Approaches to Stop Hypertension) DASH diet is lower in sodium than a typical American diet. Cut back on foods that are high in saturated fat, cholesterol, and trans fats. Eat more whole-grain foods, fish, poultry, and nuts Remain active and exercise as tolerated daily.  Monitor BP at home-Call if greater than 130/80.  Check CMP/CBC  Edema Continue Furosemide PRN Elevate BLE Continue  compression stockings Monitor fluid intake, salt intake Any gain of +3 lb daily contact office.  Hyperlipidemia associated with type 2 diabetes mellitus (HCC) Discussed lifestyle modifications. Recommended diet heavy in fruits and veggies, omega 3's. Decrease consumption of animal meats, cheeses, and dairy products. Remain active and exercise as tolerated. Continue to monitor. Check lipids/TSH  Persistent atrial fibrillation (HCC) S/p ablation off AAD therapy Continue coumadin via cardiology for [name] Continue following up with cardio/ coumadin clinic   CKD stage 2 due to type 2 diabetes mellitus (HCC) Discussed how what you eat and drink can aide in kidney protection. Stay well hydrated. Avoid high salt foods. Avoid NSAIDS. Keep BP and BG well controlled.   Take medications as prescribed. Remain active and exercise as tolerated daily. Maintain weight.  Continue to monitor. Check CMP/GFR/Microablumin  Type 2 diabetes mellitus with stage 2 chronic kidney disease, without long-term current use of insulin [facility]) Education: Reviewed 'ABCs' of diabetes management  Discussed goals to be met and/or maintained include A1C (<7) Blood pressure (<130/80) Cholesterol (LDL <70) Continue Eye Exam yearly  Continue Dental Exam Q6 mo Discussed dietary recommendations Discussed Physical Activity recommendations Check A1C  Chronic  gout without tophus, unspecified cause, unspecified site Continue Allopurinol 300mg  daily No recent flares Diet discussed including avoid high purine foods Continue to monitor  OSA (obstructive sleep apnea) Was following neuro, tolerated CPAP poorly, adamantly declined further follow up/referral back. Discussed cardiac/memory risks. Continues to decline for now.  Discussed benefit of weight loss  Coronary artery disease involving native coronary artery of native heart without angina pectoris Discussed dietary and exercise modifications Blood pressure and cholesterol control Per CT 01/29/21  ILD Controlled Continue to monitor No maintenance medication  Morbid obesity (HCC) -  BMI 30+ with OSA Discussed appropriate BMI Diet modification. Physical activity. Encouraged/praised to build confidence.  Hypothyroidism, Controlled. Continue Levothyroxine. Reminded to take on an empty stomach 30-36mins before food.  Stop any Biotin Supplement 48-72 hours before next TSH level to reduce the risk of falsely low TSH levels. Continue to monitor.    Vitamin D deficiency Continue supplement for goal of 60-100 Monitor Vitamin D levels  Medication management All medications discussed and reviewed in full. All questions and concerns regarding medications addressed.    Barrett's esophagus Continue PPI daily, follow up EGD due 2025 per GI  History of colon polyps UTD; next due 09/2022 High fiber diet encouraged  Orders Placed This Encounter  Procedures   CBC with Differential/Platelet   COMPLETE METABOLIC PANEL WITH GFR   Lipid panel   TSH   Hemoglobin A1c   Notify office for further evaluation and treatment, questions or concerns if any reported s/s fail to improve.   The patient was advised to call back or seek an in-person evaluation if any symptoms worsen or if the condition fails to improve as anticipated.   Further disposition pending results of labs. Discussed med's effects  and SE's.  I discussed the assessment and treatment plan with the patient. The patient was provided an opportunity to ask questions and all were answered. The patient agreed with the plan and demonstrated an understanding of the instructions.  Discussed med's effects and SE's. Screening labs and tests as requested with regular follow-up as recommended.  I provided 30 minutes of face-to-face time during this encounter including counseling, chart review, and critical decision making was preformed.  Today's Plan of Care is based on a patient-centered health care approach known as shared decision making - the decisions, tests and treatments allow for patient preferences and values to be balanced with clinical evidence.     Future Appointments  Date Time Provider Department Center  04/13/2023  2:00 PM [facility] None  06/10/2023  2:20 PM [name], MD [facility]  06/17/2023  3:30 PM [name], MD [facility] None  09/16/2023  2:30 PM [name], NP [facility] None  12/28/2023  2:00 PM [name], MD [facility] None      Subjective:  Christopher Wade is a 72 y.o. male who presents for a 3 month follow up. 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  Overall he reports feeling well today.  He has no new concerns at this time.  Patient had upper GI bleed and hospitalized 05/26/20. With duodenal ulcer, gastritis and also found distal short segment Barrett's. Pathology showed acute and chronic inflammatory changes, neg H. Pylori, intestinal metaplasia without dysplasia or malignancy. Plans to repeat EGD in 3 years for Barrett's surveillance.   He has been taking pantoprazole 40 mg now daily decreased from BID to once daily and reports sx remain well controlled.   OSA recommended CPAP but patient declined, declines referral.   ILD/ dyspnea improved off of amiodarone, did see [name] in 2018, was stable/negative labs and no follow up since.   BMI is Body mass index is 35.3 kg/m., he has been working on diet and exercise.  Continues to lose weight.  Wt Readings from Last 3 Encounters:  03/17/23 246 lb (111.6 kg)  12/30/22 230 lb (104.3 kg)  12/22/22 237 lb (107.5 kg)   He has aortic atherosclerosis per CT chest in 2018.  Patient has history of Afib (2016) and had CV x 2 (2016 & 2017) and then Jan 2018 had an Afib Ablation ([name]) without noted recurrence, does remain on coumadin managed by coumadin clinic (couldn't afford xarelto) followed by [name].  His blood pressure has been controlled at home, today their BP is BP: 122/60,    He denies chest pain, shortness of breath, dizziness.   He is on cholesterol medication (atorvastatin 80 mg) and denies myalgias. His cholesterol is at goal. The cholesterol last visit was:   Lab Results  Component Value Date   CHOL 132 12/10/2022   HDL 46 12/10/2022   LDLCALC 64 12/10/2022   TRIG 136 12/10/2022   CHOLHDL 2.9 12/10/2022   He has been working on diet and exercise for diabetes (metformin 1000 mg BID)  with CKD II, and denies hyperglycemia, hypoglycemia , increased appetite, nausea, polydipsia, polyuria, visual disturbances and vomiting.  Fasting ranges 107-130.  Last A1C in the office was:  Lab Results  Component Value Date   HGBA1C 7.7 (H) 12/10/2022   Last GFR Lab Results  Component Value Date   EGFR 60 12/30/2022   Taking 100 mcg levothyroxine daily without recent dose change.  Lab Results  Component Value Date   TSH 0.32 (L) 12/10/2022   Patient is on Vitamin D supplement.   Lab Results  Component Value Date   VD25OH 63 12/10/2022       Medication Review:  Current Outpatient Medications (Endocrine & Metabolic):    levothyroxine (SYNTHROID) 100 MCG tablet, Take  1 tablet  Daily  on an empty stomach with only water for 30 minutes & no Antacid meds, Calcium or Magnesium for 4 hours & avoid Biotin                                                                                 /                                         TAKE                                                     BY                                                  MOUTH   metFORMIN (GLUCOPHAGE-XR) 500 MG 24 hr tablet, Take 2 tablets in the morning and 2 tablets in the evening.  Current Outpatient Medications (Cardiovascular):    amLODipine (NORVASC) 10 MG tablet, Take 1 tab  Daily For BP & Heart   atorvastatin (LIPITOR) 80 MG tablet, TAKE 1/2 TABLET BY MOUTH DAILY   carvedilol (COREG) 12.5 MG tablet, TAKE 1 TABLET BY MOUTH TWICE DAILY WITH A MEAL   furosemide (LASIX) 40 MG tablet, TAKE 1 TABLET BY MOUTH 1 TO 2 TIMES PER DAY AS NEEDED FOR FLUID RETENTION OR ANKLE SWELLING   olmesartan (BENICAR) 40 MG tablet, TAKE 1 TABLET BY MOUTH DAILY FOR BLOOD PRESSURE  Current Outpatient Medications (Respiratory):    loratadine (CLARITIN) 10 MG tablet, Take 10 mg by mouth daily.  Current Outpatient Medications (Analgesics):    acetaminophen (TYLENOL) 325 MG tablet, Take 325 mg by mouth as needed for mild pain.   allopurinol  (ZYLOPRIM) 300 MG tablet, TAKE 1 TABLET BY MOUTH DAILY FOR GOUT PREVENTION  Current  Outpatient Medications (Hematological):    Ferrous Sulfate (IRON) 325 (65 Fe) MG TABS, Take 1 tablet (325 mg total) by mouth every other day.   warfarin (COUMADIN) 5 MG tablet, TAKE 1 TABLET BY MOUTH DAILY. EXCEPT 1 1/2 TABLETS ON MONDAYS OR AS DIRECTED BY COAGULATION CLINIC  Current Outpatient Medications (Other):    Ascorbic Acid (VITAMIN C WITH ROSE HIPS) 500 MG tablet, Take 500 mg by mouth daily.   B Complex Vitamins (B COMPLEX PO), Take 1 tablet by mouth daily.   cholecalciferol (VITAMIN D) 1000 UNITS tablet, Take 2,000 Units by mouth daily.   magnesium gluconate (MAGONATE) 500 MG tablet, Take 500mg  TID and with meals.   Omega-3 Fatty Acids (FISH OIL) 1200 MG CAPS, Take 1,200 mg by mouth daily.   pantoprazole (PROTONIX) 40 MG tablet, TAKE 1 TABLET BY MOUTH EVERY DAY BEFORE BREAKFAST   potassium chloride SA (KLOR-CON M) 20 MEQ tablet, Take 2 tablets (40 mEq total) by mouth daily for 7 days.   zinc gluconate 50 MG tablet, Take 50 mg by mouth daily.  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   Screening Tests: Immunization History  Administered Date(s) Administered   Influenza Split 04/05/2014   Influenza, High Dose Seasonal PF 03/31/2018, 03/20/2019, 05/07/2020, 05/28/2021, 03/17/2023   Influenza-Unspecified 02/02/2013, 05/21/2016   PFIZER Comirnaty(Gray Top)Covid-19 Tri-Sucrose Vaccine 05/30/2021   PFIZER(Purple Top)SARS-COV-2 Vaccination 12/13/2019, 01/03/2020, 07/09/2020   Pfizer(Comirnaty)Fall Seasonal Vaccine 12 years and older 03/17/2023   Pneumococcal Conjugate-13 05/01/2014   Pneumococcal Polysaccharide-23 05/16/2015   Td 04/25/2010, 05/07/2020   Varicella Zoster Immune Globulin 02/22/2014   Health Maintenance  Topic Date Due   Zoster Vaccines- Shingrix (1 of 2) Never done   FOOT EXAM  11/06/2021   Colonoscopy  10/06/2022   OPHTHALMOLOGY EXAM  01/21/2023   COVID-19 Vaccine (6 - 2023-24 season) 05/12/2023   HEMOGLOBIN A1C  06/12/2023   Medicare Annual Wellness (AWV)  08/27/2023   Diabetic kidney evaluation - Urine ACR  12/10/2023   Diabetic kidney evaluation - eGFR measurement  12/30/2023   DTaP/Tdap/Td (3 - Tdap) 05/07/2030   Pneumonia Vaccine 14+ Years old  Completed   INFLUENZA VACCINE  Completed   Hepatitis C Screening  Completed   HPV VACCINES  Aged Out    Patient Care Team: [name], MD as PCP - General (Internal Medicine) Regan Lemming, MD as PCP - Electrophysiology (Cardiology) [name], MD as PCP - Cardiology (Cardiology) Iva Boop, MD as Consulting Physician (Gastroenterology) [name], MD as Consulting Physician (Cardiology) Charlett Nose, [facility] (Inactive) as Pharmacist (Pharmacist)  Surgical:  He  has a past surgical history that includes Cardioversion (N/A, 04/05/2015); Cardioversion (N/A,  12/09/2015); Cardioversion (N/A, 03/16/2016); Cardiac catheterization (N/A, 06/11/2016); Total hip arthroplasty (Bilateral, 2001); Uvulectomy; Cardiac catheterization (N/A, 10/11/2015); Cardiac catheterization (N/A, 10/11/2015); Cardiac catheterization (N/A, 10/11/2015); Cardiac catheterization (N/A, 10/14/2015); Coronary angioplasty; Cataract extraction (Right, 2019); Esophagogastroduodenoscopy (egd) with propofol (N/A, 05/28/2020); and biopsy (05/28/2020). Family His family history includes Colon cancer in his sister; Liver disease in his brother, father, mother, and sister; Lung cancer in his father and mother; Prostate cancer in his brother. Social history  He reports that he has never smoked. He has never used smokeless tobacco. He reports that he does not drink alcohol and does not use drugs.  Objective:   Today's Vitals   03/17/23 1424  BP: 122/60  Pulse: 71  Temp: 98 F (36.7 C)  SpO2: 96%  Weight: 246 lb (111.6 kg)  Height: 5\' 10"  (1.778 m)    Body mass index is 35.3 kg/m.  General appearance: alert, no distress, WD/WN, male HEENT: normocephalic, sclerae anicteric, TMs pearly, nares patent, no discharge or erythema, pharynx normal Oral cavity: MMM, no lesions Neck: supple, no lymphadenopathy, no thyromegaly, no masses Heart: RRR, normal S1, S2, no murmurs Lungs: CTA bilaterally, no wheezes, rhonchi, or rales Abdomen: +bs, soft, non tender, non distended, no masses, no hepatomegaly, no splenomegaly Musculoskeletal: nontender, no swelling, no obvious deformity Extremities: no edema, no cyanosis, no clubbing Pulses: 2+ symmetric, upper and lower extremities, normal cap refill Neurological: alert, oriented x 3, CN2-12 intact, strength normal upper extremities and lower extremities, sensation normal throughout, DTRs 2+ throughout, no cerebellar signs, gait normal Psychiatric: normal affect, behavior normal, pleasant   [name], NP 2:56 PM [facility]

## 2023-03-17 NOTE — Patient Instructions (Signed)
Description   TAKE 1.5 TABLETS TODAY ONLY THEN Continue taking Warfarin 1 tablet daily except 1.5 tablets on Mondays. Stay consistent with greens each week (2-3 times per week). Recheck INR in 4 weeks.  Coumadin Clinic:[phone] or [phone] Fax number [phone]

## 2023-03-18 LAB — CBC WITH DIFFERENTIAL/PLATELET
Absolute Lymphocytes: 1673 {cells}/uL (ref 850–3900)
Absolute Monocytes: 761 {cells}/uL (ref 200–950)
Basophils Absolute: 47 {cells}/uL (ref 0–200)
Basophils Relative: 0.5 %
Eosinophils Absolute: 141 {cells}/uL (ref 15–500)
Eosinophils Relative: 1.5 %
HCT: 40.6 % (ref 38.5–50.0)
Hemoglobin: 13.6 g/dL (ref 13.2–17.1)
MCH: 33.3 pg — ABNORMAL HIGH (ref 27.0–33.0)
MCHC: 33.5 g/dL (ref 32.0–36.0)
MCV: 99.3 fL (ref 80.0–100.0)
MPV: 9.9 fL (ref 7.5–12.5)
Monocytes Relative: 8.1 %
Neutro Abs: 6777 {cells}/uL (ref 1500–7800)
Neutrophils Relative %: 72.1 %
Platelets: 311 10*3/uL (ref 140–400)
RBC: 4.09 10*6/uL — ABNORMAL LOW (ref 4.20–5.80)
RDW: 14.4 % (ref 11.0–15.0)
Total Lymphocyte: 17.8 %
WBC: 9.4 10*3/uL (ref 3.8–10.8)

## 2023-03-18 LAB — COMPLETE METABOLIC PANEL WITH GFR
AG Ratio: 1.4 (calc) (ref 1.0–2.5)
ALT: 10 U/L (ref 9–46)
AST: 14 U/L (ref 10–35)
Albumin: 4.2 g/dL (ref 3.6–5.1)
Alkaline phosphatase (APISO): 83 U/L (ref 35–144)
BUN/Creatinine Ratio: 12 (calc) (ref 6–22)
BUN: 19 mg/dL (ref 7–25)
CO2: 26 mmol/L (ref 20–32)
Calcium: 9.3 mg/dL (ref 8.6–10.3)
Chloride: 98 mmol/L (ref 98–110)
Creat: 1.56 mg/dL — ABNORMAL HIGH (ref 0.70–1.28)
Globulin: 3 g/dL (ref 1.9–3.7)
Glucose, Bld: 143 mg/dL — ABNORMAL HIGH (ref 65–99)
Potassium: 3.5 mmol/L (ref 3.5–5.3)
Sodium: 142 mmol/L (ref 135–146)
Total Bilirubin: 0.8 mg/dL (ref 0.2–1.2)
Total Protein: 7.2 g/dL (ref 6.1–8.1)
eGFR: 47 mL/min/{1.73_m2} — ABNORMAL LOW (ref >=60)

## 2023-03-18 LAB — LIPID PANEL
Cholesterol: 124 mg/dL (ref <200)
HDL: 40 mg/dL (ref >=40)
LDL Cholesterol (Calc): 56 mg/dL
Non-HDL Cholesterol (Calc): 84 mg/dL (ref <130)
Total CHOL/HDL Ratio: 3.1 (calc) (ref <5.0)
Triglycerides: 225 mg/dL — ABNORMAL HIGH (ref <150)

## 2023-03-18 LAB — HEMOGLOBIN A1C
Hgb A1c MFr Bld: 7.5 %{Hb} — ABNORMAL HIGH (ref <5.7)
Mean Plasma Glucose: 169 mg/dL
eAG (mmol/L): 9.3 mmol/L

## 2023-03-18 LAB — TSH: TSH: 4.13 m[IU]/L (ref 0.40–4.50)

## 2023-04-13 ENCOUNTER — Ambulatory Visit: Payer: Medicare Other | Attending: Cardiology | Admitting: *Deleted

## 2023-04-13 DIAGNOSIS — Z5181 Encounter for therapeutic drug level monitoring: Secondary | ICD-10-CM | POA: Diagnosis not present

## 2023-04-13 DIAGNOSIS — I4819 Other persistent atrial fibrillation: Secondary | ICD-10-CM

## 2023-04-13 LAB — POCT INR: INR: 2.1 (ref 2.0–3.0)

## 2023-04-13 NOTE — Patient Instructions (Signed)
Description   Continue taking Warfarin 1 tablet daily except 1.5 tablets on Mondays. Stay consistent with greens each week (2-3 times per week). Recheck INR in 4 weeks.  [phone] or [phone] Fax number [phone]

## 2023-04-14 ENCOUNTER — Ambulatory Visit (INDEPENDENT_AMBULATORY_CARE_PROVIDER_SITE_OTHER): Payer: Medicare Other | Admitting: Nurse Practitioner

## 2023-04-14 VITALS — BP 142/70 | HR 67 | Temp 97.9°F | Ht 70.0 in | Wt 244.6 lb

## 2023-04-14 DIAGNOSIS — I4819 Other persistent atrial fibrillation: Secondary | ICD-10-CM | POA: Diagnosis not present

## 2023-04-14 DIAGNOSIS — I2510 Atherosclerotic heart disease of native coronary artery without angina pectoris: Secondary | ICD-10-CM

## 2023-04-14 DIAGNOSIS — J849 Interstitial pulmonary disease, unspecified: Secondary | ICD-10-CM | POA: Diagnosis not present

## 2023-04-14 DIAGNOSIS — R0609 Other forms of dyspnea: Secondary | ICD-10-CM

## 2023-04-14 DIAGNOSIS — G4733 Obstructive sleep apnea (adult) (pediatric): Secondary | ICD-10-CM

## 2023-04-14 DIAGNOSIS — R609 Edema, unspecified: Secondary | ICD-10-CM

## 2023-04-14 MED ORDER — ALBUTEROL SULFATE HFA 108 (90 BASE) MCG/ACT IN AERS: 2.0000 | INHALATION_SPRAY | Freq: Four times a day (QID) | RESPIRATORY_TRACT | 2 refills | Status: DC | PRN

## 2023-04-14 NOTE — Progress Notes (Signed)
Assessment and Plan:  Christopher Wade was seen today for an episodic visit.  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 Off Amiodarone. Refer back to Pulmonology considering increase in reports of DOE - saw [name] in 2018 stable  Report to ER for any increase in difficulty breathing.   - albuterol (VENTOLIN HFA) 108 (90 Base) MCG/ACT inhaler; Inhale 2 puffs into the lungs every 6 (six) hours as needed for wheezing or shortness of breath.  Dispense: 8 g; Refill: 2  - PR SPMTRY W/VC EXPIRATORY FLO W/WO MXML VOL VNTJ  Dependent edema Disease process and medications discussed.  Emphasized salt restriction, less than 2000mg  a day. Encouraged daily monitoring of the patient's weight, call office if 5 lb weight loss or gain in a day.  Encouraged regular exercise. If any increasing shortness of breath, swelling, or chest pressure go to ER immediately.  decrease your fluid intake to Monitor fluid intake to less than 2 L daily  Persistent atrial fibrillation (HCC) Continue Warfarin  Coronary artery disease involving native coronary artery of native heart without angina pectoris Discussed lifestyle modifications. Recommended diet heavy in fruits and veggies, omega 3's. Decrease consumption of animal meats, cheeses, and dairy products. Remain active and exercise as tolerated. Continue to monitor.  OSA  Recommended CPAP - patient has hx of declining.    Morbid Obesity Discussed appropriate BMI Diet modification. Physical activity. Encouraged/praised to build confidence.    Notify office for further evaluation and treatment,  questions or concerns if any reported s/s fail to improve.   The patient was advised to call back or seek an in-person evaluation if any symptoms worsen or if the condition fails to improve as anticipated.   Further disposition pending results of labs. Discussed med's effects and SE's.    I discussed the assessment and treatment plan with the patient. The patient was provided an opportunity to ask questions and all were answered. The patient agreed with the plan and demonstrated an understanding of the instructions.  Discussed med's effects and SE's. Screening labs and tests as requested with regular follow-up as recommended.  I provided 30 minutes of face-to-face time during this encounter including counseling, chart review, and critical decision making was preformed.  Today's Plan of Care is based on a patient-centered health care approach known as shared decision making - the decisions, tests and treatments allow for patient preferences and values to be balanced with clinical evidence.     Future Appointments  Date Time Provider Department Center  05/11/2023  1:15 PM [facility] None  06/10/2023  2:20 PM [name], MD [facility]  06/17/2023  3:30 PM [name], MD [facility] None  09/16/2023  2:30 PM [name], NP [facility] None  12/28/2023  2:00 PM [name], MD [facility] None     ------------------------------------------------------------------------------------------------------------------   HPI BP (!) 142/70   Pulse 67   Temp 97.9 F (36.6 C)   Ht 5\' 10"  (1.778 m)   Wt 244 lb 9.6 oz (110.9 kg)   SpO2 90%   BMI 35.10 kg/m   72 y.o.male presents for evaluation of worsening DOE.  ILD/ dyspnea improved off  of amiodarone, did see [name] in 2018, was stable/negative labs and no follow up since.   He has aortic atherosclerosis per CT chest in 2018.  Patient has history of Afib (2016) and had CV x 2  (2016 & 2017) and then Jan 2018 had an Afib Ablation ([name]) without noted recurrence, does remain on coumadin managed by coumadin clinic (couldn't afford xarelto) followed by [name].  OSA recommended CPAP but patient declined, declines referral.   BMI is Body mass index is 35.1 kg/m., he has not been working on diet and exercise. Wt Readings from Last 3 Encounters:  04/14/23 244 lb 9.6 oz (110.9 kg)  03/17/23 246 lb (111.6 kg)  12/30/22 230 lb (104.3 kg)    Past Medical History:  Diagnosis Date   AKI (acute kidney injury) (HCC) 05/27/2020   Arthritis    "joints get stiff" (06/11/2016)   Bee sting allergy    Bee sting allergy    CAD (coronary artery disease), native coronary artery 02/12/2016   cath showing 50% LM, 40% distal LAD, 70% ost to prox LCs, 40% mid LAD and 95% mid to dist RCA s/p PCI with DES x 2 and stage PCI of the LCx/OM.   Carotid artery stenosis    1-39% bilateral carotid stenosis by dopples 06/2022   Duodenal ulcer    History of gout    Hx of adenomatous colonic polyps 04/10/2008   Hyperlipidemia    Hypertension    Hypothyroidism    OSA (obstructive sleep apnea)    "tried CPAP; couldn't do it" (06/11/2016)   Persistent atrial fibrillation (HCC)    s/p DCCV 03/2015 with reversion back to atrial fibrillation, loaded with Amio and s/p DCCV to NSR 11/2015.  He is now s/p afib albation.  He had amio lung toxicity and amio was stopped.   Subclavian artery stenosis (HCC)    right subclavian artery stenosis by dopplers 2/24   Thyroid disease    Type II diabetes mellitus (HCC)    Vitamin D deficiency      Allergies  Allergen Reactions   Amiodarone Other (See Comments)    Lung toxicity   Bee Venom Other (See Comments)    Leg went numb    Current Outpatient Medications on File Prior to Visit  Medication Sig   acetaminophen (TYLENOL) 325 MG tablet Take 325 mg by mouth as needed for mild pain.   allopurinol (ZYLOPRIM) 300 MG tablet TAKE 1 TABLET BY  MOUTH DAILY FOR GOUT PREVENTION   amLODipine (NORVASC) 10 MG tablet Take 1 tab  Daily For BP & Heart   Ascorbic Acid (VITAMIN C WITH ROSE HIPS) 500 MG tablet Take 500 mg by mouth daily.   atorvastatin (LIPITOR) 80 MG tablet TAKE 1/2 TABLET BY MOUTH DAILY   B Complex Vitamins (B COMPLEX PO) Take 1 tablet by mouth daily.   carvedilol (COREG) 12.5 MG tablet TAKE 1 TABLET BY MOUTH TWICE DAILY WITH A MEAL   cholecalciferol (VITAMIN D) 1000 UNITS tablet Take 2,000 Units by mouth daily.   Ferrous Sulfate (IRON) 325 (65 Fe) MG TABS Take 1 tablet (325 mg total) by mouth every other day.   furosemide (LASIX) 40 MG tablet TAKE 1 TABLET BY MOUTH 1 TO 2 TIMES PER DAY AS NEEDED FOR FLUID RETENTION OR ANKLE SWELLING   levothyroxine (SYNTHROID) 100 MCG tablet Take  1 tablet  Daily  on an empty stomach with only water for 30 minutes & no Antacid meds, Calcium or Magnesium  for 4 hours & avoid Biotin                                                                                 /                                         TAKE                                                     BY                                                  MOUTH   loratadine (CLARITIN) 10 MG tablet Take 10 mg by mouth daily.   magnesium gluconate (MAGONATE) 500 MG tablet Take 500mg  TID and with meals.   metFORMIN (GLUCOPHAGE-XR) 500 MG 24 hr tablet Take 2 tablets in the morning and 2 tablets in the evening.   olmesartan (BENICAR) 40 MG tablet TAKE 1 TABLET BY MOUTH DAILY FOR BLOOD PRESSURE   Omega-3 Fatty Acids (FISH OIL) 1200 MG CAPS Take 1,200 mg by mouth daily.   pantoprazole (PROTONIX) 40 MG tablet TAKE 1 TABLET BY MOUTH EVERY DAY BEFORE BREAKFAST   potassium chloride SA (KLOR-CON M) 20 MEQ tablet Take 2 tablets (40 mEq total) by mouth daily for 7 days.   warfarin (COUMADIN) 5 MG tablet TAKE 1 TABLET BY MOUTH DAILY. EXCEPT 1 1/2 TABLETS ON MONDAYS OR AS DIRECTED BY COAGULATION CLINIC   zinc gluconate 50 MG tablet Take 50 mg by mouth daily.    No current facility-administered medications on file prior to visit.    ROS: all negative except what is noted in the HPI.   Physical Exam:  BP (!) 142/70   Pulse 67   Temp 97.9 F (36.6 C)   Ht 5\' 10"  (1.778 m)   Wt 244 lb 9.6 oz (110.9 kg)   SpO2 90%   BMI 35.10 kg/m   General Appearance: NAD.  Awake, conversant and cooperative. Eyes: PERRLA, EOMs intact.  Sclera white.  Conjunctiva without erythema. Sinuses: No frontal/maxillary tenderness.  No nasal discharge. Nares patent.  ENT/Mouth: Ext aud canals clear.  Bilateral TMs w/DOL and without erythema or bulging. Hearing intact.  Posterior pharynx without swelling or exudate.  Tonsils without swelling or erythema.  Neck: Supple.  No masses, nodules or thyromegaly. Respiratory: Effort is regular with non-labored breathing. Breath sounds are equal bilaterally without rales, rhonchi, wheezing or stridor.  Cardio: RRR with no MRGs. Brisk peripheral pulses without edema.  Abdomen: Active BS in all four quadrants.  Soft and non-tender without guarding, rebound tenderness, hernias or masses. Lymphatics: Non tender without lymphadenopathy.  Musculoskeletal: Full ROM, 5/5 strength, normal ambulation.  No clubbing or cyanosis. Skin: Appropriate color for ethnicity. Warm without rashes,  lesions, ecchymosis, ulcers.  Neuro: CN II-XII grossly normal. Normal muscle tone without cerebellar symptoms and intact sensation.   Psych: AO X 3,  appropriate mood and affect, insight and judgment.     [name], NP 4:47 PM [facility]

## 2023-04-19 ENCOUNTER — Telehealth: Payer: Self-pay | Admitting: Nurse Practitioner

## 2023-04-19 NOTE — Telephone Encounter (Signed)
Patient said you were going to call him with information as to where he can go about his breathing?

## 2023-04-19 NOTE — Patient Instructions (Addendum)
Pulmonary Function Tests Pulmonary function tests (PFTs) are breathing tests that are used to: Measure how well your lungs work. Find out what is causing your lung problems. Find the best treatment for you. You may have PFTs: If you have a condition that affects your lungs, such as asthma or chronic obstructive pulmonary disease (COPD). To watch for changes in your lung function over time if you have a long-term (chronic) lung disease. If you are an Nature conservation officer. PFTs check the effects of being exposed to chemicals over a long period of time. To check lung function: Before having surgery or other procedures. If you smoke. To check if prescribed medicines or treatments are helping your lungs. Tell a health care provider about: Any allergies you have. All medicines you are taking, including inhaler or nebulizer medicines, vitamins, herbs, eye drops, creams, and over-the-counter medicines. Any bleeding problems you have. Any surgeries you have had, especially recent surgery of the eye, abdomen, or chest. These can make PFTs difficult or unsafe. Any medical conditions you have, including chest pain or heart problems, tuberculosis, or respiratory infections such as pneumonia, a cold, or the flu. Any fear of being in closed spaces (claustrophobia). Some of your tests may be in a closed space. What are the risks? Your health care provider will talk with you about risks. These may include: Feeling light-headed due to fast, deep breathing known as overbreathing or hyperventilation. An asthma attack from deep breathing. What happens before the test? Take over-the-counter and prescription medicines only as told by your health care provider. If you take inhaler or nebulizer medicines, ask your health care provider which medicines you should take on the day of your testing. Some inhaler medicines may interfere with PFTs if they are taken shortly before the tests. Follow instructions from your  health care provider about what you may eat and drink. These may include: Avoiding eating large meals. Avoiding using caffeine before the testing. Not drinkingalcohol for up to 4 hours before the test. Do not use any products that contain nicotine or tobacco for up to 4 hours before your test. These products include cigarettes, chewing tobacco, and vaping devices, such as e-cigarettes. These can affect your test results. If you need help quitting, ask your health care provider. Wear comfortable clothing that will not get in the way of your breathing. Avoid exercise that takes a lot of effort (strenuous exercise) for at least 30 minutes before the test. What happens during the test?  You will be given: A soft noseclip to wear. This allows all of your breaths to go through your mouth instead of your nose. A germ-free (sterile) mouthpiece. It will be attached to a spirometer machine that measures your breathing. You will be asked to do breathing exercises. The exercises will be done by breathing in (inhaling) and breathing out (exhaling). You may have to repeat the exercises many times before the testing is complete. You will need to follow instructions exactly as told to get accurate results. Make sure to blow as hard and as fast as you can when you are told to do so. You may be given a medicine called a bronchodilator. This makes the small air passages in your lungs larger so you can breathe easier. The tests will be repeated after the medicine takes effect. You will be watched for any problems, such as feeling faint or dizzy, or having trouble breathing. The procedure may vary among health care providers and hospitals. What can I expect after  the test? Your results will be compared with the expected lung function of someone with healthy lungs who is similar to you in several ways. These ways include age, sex, height, weight, and race or ethnicity. This is done to show how your lung function  compares with normal lung function (percent predicted). The percent predicted helps your health care provider know if your lung function is normal or not. If you have had PFTs done before, your health care provider will compare your current results with past results. This shows if your lung function is better, worse, or the same as before. It is up to you to get the results of your procedure. Ask your health care provider, or the department that is doing the procedure, when your results will be ready. After you get your results, talk with your health care provider about treatment options, if necessary. This information is not intended to replace advice given to you by your health care provider. Make sure you discuss any questions you have with your health care provider. Document Revised: 12/01/2021 Document Reviewed: 12/01/2021 Elsevier Patient Education  2024 [employer].

## 2023-04-20 ENCOUNTER — Other Ambulatory Visit: Payer: Self-pay | Admitting: Nurse Practitioner

## 2023-04-20 ENCOUNTER — Telehealth: Payer: Self-pay | Admitting: Nurse Practitioner

## 2023-04-20 ENCOUNTER — Telehealth: Payer: Self-pay

## 2023-04-20 DIAGNOSIS — R0609 Other forms of dyspnea: Secondary | ICD-10-CM

## 2023-04-20 DIAGNOSIS — J849 Interstitial pulmonary disease, unspecified: Secondary | ICD-10-CM

## 2023-04-20 MED ORDER — TRELEGY ELLIPTA 100-62.5-25 MCG/ACT IN AEPB: 100.0000 ug | INHALATION_SPRAY | Freq: Every day | RESPIRATORY_TRACT | 6 refills | Status: DC

## 2023-04-20 NOTE — Telephone Encounter (Signed)
Patient would like another inhaler to use in between taking the Albuterol.

## 2023-04-20 NOTE — Telephone Encounter (Signed)
Patient informed. 

## 2023-04-20 NOTE — Telephone Encounter (Signed)
Patient said Christopher Wade told him someone was supposed to call him to follow up?

## 2023-04-21 NOTE — Telephone Encounter (Signed)
Left message on voicemail letting him know that an inhaler was sent in but if too expensive to come by the office for a sample.

## 2023-04-26 ENCOUNTER — Other Ambulatory Visit: Payer: Self-pay | Admitting: Cardiology

## 2023-04-26 ENCOUNTER — Other Ambulatory Visit: Payer: Self-pay | Admitting: Nurse Practitioner

## 2023-04-26 DIAGNOSIS — R609 Edema, unspecified: Secondary | ICD-10-CM

## 2023-04-26 DIAGNOSIS — I4819 Other persistent atrial fibrillation: Secondary | ICD-10-CM

## 2023-05-11 ENCOUNTER — Ambulatory Visit: Payer: Medicare Other | Attending: Cardiology | Admitting: *Deleted

## 2023-05-11 DIAGNOSIS — Z5181 Encounter for therapeutic drug level monitoring: Secondary | ICD-10-CM

## 2023-05-11 DIAGNOSIS — I4819 Other persistent atrial fibrillation: Secondary | ICD-10-CM | POA: Diagnosis not present

## 2023-05-11 LAB — POCT INR: INR: 1.9 — AB (ref 2.0–3.0)

## 2023-05-11 NOTE — Patient Instructions (Signed)
Description   Today take 1.5 tablets of warfarin then continue taking Warfarin 1 tablet daily except 1.5 tablets on Mondays. Stay consistent with greens each week (2-3 times per week). Recheck INR in 4 weeks. Coumadin Clinic:[phone] or [phone] Fax number [phone]

## 2023-05-23 ENCOUNTER — Other Ambulatory Visit: Payer: Self-pay | Admitting: Nurse Practitioner

## 2023-05-23 DIAGNOSIS — M1A9XX0 Chronic gout, unspecified, without tophus (tophi): Secondary | ICD-10-CM

## 2023-05-27 ENCOUNTER — Other Ambulatory Visit: Payer: Self-pay | Admitting: Nurse Practitioner

## 2023-05-27 DIAGNOSIS — I10 Essential (primary) hypertension: Secondary | ICD-10-CM

## 2023-06-08 ENCOUNTER — Ambulatory Visit: Payer: Medicare Other

## 2023-06-10 ENCOUNTER — Encounter: Payer: Self-pay | Admitting: Cardiology

## 2023-06-10 ENCOUNTER — Ambulatory Visit: Payer: PPO | Attending: Cardiology | Admitting: Cardiology

## 2023-06-10 VITALS — BP 132/62 | HR 66 | Ht 70.0 in | Wt 230.8 lb

## 2023-06-10 DIAGNOSIS — E1169 Type 2 diabetes mellitus with other specified complication: Secondary | ICD-10-CM

## 2023-06-10 DIAGNOSIS — E785 Hyperlipidemia, unspecified: Secondary | ICD-10-CM

## 2023-06-10 DIAGNOSIS — I6523 Occlusion and stenosis of bilateral carotid arteries: Secondary | ICD-10-CM

## 2023-06-10 DIAGNOSIS — R0600 Dyspnea, unspecified: Secondary | ICD-10-CM

## 2023-06-10 DIAGNOSIS — I2510 Atherosclerotic heart disease of native coronary artery without angina pectoris: Secondary | ICD-10-CM

## 2023-06-10 DIAGNOSIS — I10 Essential (primary) hypertension: Secondary | ICD-10-CM | POA: Diagnosis not present

## 2023-06-10 DIAGNOSIS — I4819 Other persistent atrial fibrillation: Secondary | ICD-10-CM | POA: Diagnosis not present

## 2023-06-10 NOTE — Patient Instructions (Addendum)
Medication Instructions:  Your physician recommends that you continue on your current medications as directed. Please refer to the Current Medication list given to you today.  *If you need a refill on your cardiac medications before your next appointment, please call your pharmacy*  Lab Work: BNP BMP Please  go to [facility] on 1st floor today. If you have labs (blood work) drawn today and your tests are completely normal, you will receive your results only by: MyChart Message (if you have MyChart) OR A paper copy in the mail If you have any lab test that is abnormal or we need to change your treatment, we will call you to review the results.  Testing/Procedures: Your physician has requested that you have cardiac PET CT stress test.  Your physician has requested that you have an echocardiogram. Echocardiography is a painless test that uses sound waves to create images of your heart. It provides your doctor with information about the size and shape of your heart and how well your heart's chambers and valves are working. This procedure takes approximately one hour. There are no restrictions for this procedure. Please do NOT wear cologne, perfume, aftershave, or lotions (deodorant is allowed). Please arrive 15 minutes prior to your appointment time.  Please note: We ask at that you not bring children with you during ultrasound (echo/ vascular) testing. Due to room size and safety concerns, children are not allowed in the ultrasound rooms during exams. Our front office staff cannot provide observation of children in our lobby area while testing is being conducted. An adult accompanying a patient to their appointment will only be allowed in the ultrasound room at the discretion of the ultrasound technician under special circumstances. We apologize for any inconvenience.   Follow-Up: At [facility], you and your health needs are our priority.  As part of our continuing mission to provide you with  exceptional heart care, we have created designated Provider Care Teams.  These Care Teams include your primary Cardiologist (physician) and Advanced Practice Providers (APPs -  Physician Assistants and Nurse Practitioners) who all work together to provide you with the care you need, when you need it.  We recommend signing up for the patient portal called "MyChart".  Sign up information is provided on this After Visit Summary.  MyChart is used to connect with patients for Virtual Visits (Telemedicine).  Patients are able to view lab/test results, encounter notes, upcoming appointments, etc.  Non-urgent messages can be sent to your provider as well.   To learn more about what you can do with MyChart, go to [email or web address].    Your next appointment:   1 year(s)  The format for your next appointment:   In Person  Provider:   Armanda Magic, MD {     Please report to Radiology at the [facility] Main Entrance 30 minutes early for your test.  [address] [place], [place] [address]  How to Prepare for Your Cardiac PET/CT Stress Test:  Nothing to eat or drink, except water, 3 hours prior to arrival time.  NO caffeine/decaffeinated products, or chocolate 12 hours prior to arrival. (Please note decaffeinated beverages (teas/coffees) still contain caffeine).  If you have caffeine within 12 hours prior, the test will need to be rescheduled.  Medication instructions: Do not take erectile dysfunction medications for 72 hours prior to test (sildenafil, tadalafil) Do not take nitrates (isosorbide mononitrate, Ranexa) the day before or day of test Do not take tamsulosin the day before  or morning of test Hold theophylline containing medications for 12 hours. Hold Dipyridamole 48 hours prior to the test.  Diabetic Preparation: If able to eat breakfast prior to 3 hour fasting, you may take all medications, including your insulin. Do not worry if you miss your breakfast dose of  insulin - start at your next meal. If you do not eat prior to 3 hour fast-Hold all diabetes (oral and insulin) medications. Patients who wear a continuous glucose monitor MUST remove the device prior to scanning.  You may take your remaining medications with water.  NO perfume, cologne or lotion on chest or abdomen area.  Total time is 1 to 2 hours; you may want to bring reading material for the waiting time.  In preparation for your appointment, medication and supplies will be purchased.  Appointment availability is limited, so if you need to cancel or reschedule, please call the Radiology Department at [phone] [facility]) OR [phone] [facility]) 24 hours in advance to avoid a cancellation fee of $100.00  What to Expect When you Arrive:  Once you arrive and check in for your appointment, you will be taken to a preparation room within the Radiology Department.  A technologist or Nurse will obtain your medical history, verify that you are correctly prepped for the exam, and explain the procedure.  Afterwards, an IV will be started in your arm and electrodes will be placed on your skin for EKG monitoring during the stress portion of the exam. Then you will be escorted to the PET/CT scanner.  There, staff will get you positioned on the scanner and obtain a blood pressure and EKG.  During the exam, you will continue to be connected to the EKG and blood pressure machines.  A small, safe amount of a radioactive tracer will be injected in your IV to obtain a series of pictures of your heart along with an injection of a stress agent.    After your Exam:  It is recommended that you eat a meal and drink a caffeinated beverage to counter act any effects of the stress agent.  Drink plenty of fluids for the remainder of the day and urinate frequently for the first couple of hours after the exam.  Your doctor will inform you of your test results within 7-10 business days.  For more information and  frequently asked questions, please visit our website: [email or web address]  For questions about your test or how to prepare for your test, please call: Cardiac Imaging Nurse Navigators Office: [phone]

## 2023-06-10 NOTE — Progress Notes (Signed)
Date:  06/10/2023   ID:  Christopher Wade, DOB Jun 24, 1950, MRN [id number]   PCP:  [name], MD  Cardiologist:  Armanda Magic, MD  Electrophysiologist:  Regan Lemming, MD   Chief Complaint:  Afib, HTN, CAD, HLD  History of Present Illness:    Christopher Wade is a 73 y.o. male  with a hx of persistent atrial fibrillation s/p  DCCV 03/2015 and again in July 2017 after Amio load. He ultimately underwent afib ablation 06/11/2016.  He was found to have early lung toxicity and his Amio was stopped. He also has a history of HTN, dyslipidemia, DM, ASCAD cath showing 50% LM, 40% distal LAD, 70% ost to prox LCX, 40% mid LAD and 95% mid to dist RCA s/p PCI with DES x 2 and stage PCI of the LCx/OM.    He is here today for followup and is doing well.  Recently he has been having SOB and is scheduled to see Pulmonary. He has chronic LE edema that comes and goes.  He occasionally has some dizziness and was seen in the ER in July 2024 and was dx with orthostatic hypotension and given IVF with improvement in sx.   He denies any chest pain or pressure, PND, orthopnea, dizziness, palpitations or syncope. He is compliant with his meds and is tolerating meds with no SE.    Prior CV studies:   The following studies were reviewed today:  Labs from PCP  Past Medical History:  Diagnosis Date   AKI (acute kidney injury) (HCC) 05/27/2020   Arthritis    "joints get stiff" (06/11/2016)   Bee sting allergy    Bee sting allergy    CAD (coronary artery disease), native coronary artery 02/12/2016   cath showing 50% LM, 40% distal LAD, 70% ost to prox LCs, 40% mid LAD and 95% mid to dist RCA s/p PCI with DES x 2 and stage PCI of the LCx/OM.   Carotid artery stenosis    1-39% bilateral carotid stenosis by dopples 06/2022   Duodenal ulcer    History of gout    Hx of adenomatous colonic polyps 04/10/2008   Hyperlipidemia    Hypertension    Hypothyroidism    OSA (obstructive sleep apnea)    "tried CPAP;  couldn't do it" (06/11/2016)   Persistent atrial fibrillation (HCC)    s/p DCCV 03/2015 with reversion back to atrial fibrillation, loaded with Amio and s/p DCCV to NSR 11/2015.  He is now s/p afib albation.  He had amio lung toxicity and amio was stopped.   Subclavian artery stenosis (HCC)    right subclavian artery stenosis by dopplers 2/24   Thyroid disease    Type II diabetes mellitus (HCC)    Vitamin D deficiency    Past Surgical History:  Procedure Laterality Date   BIOPSY  05/28/2020   Procedure: BIOPSY;  Surgeon: Beverley Fiedler, MD;  Location: [facility] ENDOSCOPY;  Service: Gastroenterology;;   CARDIAC CATHETERIZATION N/A 10/11/2015   Procedure: Left Heart Cath and Coronary Angiography;  Surgeon: Wendall Stade, MD;  Location: [facility] INVASIVE CV LAB;  Service: Cardiovascular;  Laterality: N/A;   CARDIAC CATHETERIZATION N/A 10/11/2015   Procedure: Coronary Stent Intervention;  Surgeon: Peter M Swaziland, MD;  Location: [facility] INVASIVE CV LAB;  Service: Cardiovascular;  Laterality: N/A;   CARDIAC CATHETERIZATION N/A 10/11/2015   Procedure: Intravascular Pressure Wire/FFR Study;  Surgeon: Peter M Swaziland, MD;  Location: [facility] INVASIVE CV LAB;  Service: Cardiovascular;  Laterality: N/A;  CARDIAC CATHETERIZATION N/A 10/14/2015   Procedure: Coronary Stent Intervention;  Surgeon: Corky Crafts, MD;  Location: [facility] INVASIVE CV LAB;  Service: Cardiovascular;  Laterality: N/A;   CARDIOVERSION N/A 04/05/2015   Procedure: CARDIOVERSION;  Surgeon: [name], MD;  Location: [facility] ENDOSCOPY;  Service: Cardiovascular;  Laterality: N/A;   CARDIOVERSION N/A 12/09/2015   Procedure: CARDIOVERSION;  Surgeon: [name], MD;  Location: [facility] ENDOSCOPY;  Service: Cardiovascular;  Laterality: N/A;   CARDIOVERSION N/A 03/16/2016   Procedure: CARDIOVERSION;  Surgeon: Chilton Si, MD;  Location: [facility] ENDOSCOPY;  Service: Cardiovascular;  Laterality: N/A;   CATARACT EXTRACTION Right 2019   Dr. Lucious Groves   CORONARY ANGIOPLASTY      ELECTROPHYSIOLOGIC STUDY N/A 06/11/2016   Procedure: Atrial Fibrillation Ablation;  Surgeon: Will Jorja Loa, MD;  Location: [facility] INVASIVE CV LAB;  Service: Cardiovascular;  Laterality: N/A;   ESOPHAGOGASTRODUODENOSCOPY (EGD) WITH PROPOFOL N/A 05/28/2020   Procedure: ESOPHAGOGASTRODUODENOSCOPY (EGD) WITH PROPOFOL;  Surgeon: Beverley Fiedler, MD;  Location: [facility] ENDOSCOPY;  Service: Gastroenterology;  Laterality: N/A;   TOTAL HIP ARTHROPLASTY Bilateral 2001   UVULECTOMY       Current Meds  Medication Sig   acetaminophen (TYLENOL) 325 MG tablet Take 325 mg by mouth as needed for mild pain.   albuterol (VENTOLIN HFA) 108 (90 Base) MCG/ACT inhaler Inhale 2 puffs into the lungs every 6 (six) hours as needed for wheezing or shortness of breath.   allopurinol (ZYLOPRIM) 300 MG tablet TAKE 1 TABLET BY MOUTH DAILY FOR GOUT PREVENTION   amLODipine (NORVASC) 10 MG tablet Take 1 tab  Daily For BP & Heart   Ascorbic Acid (VITAMIN C WITH ROSE HIPS) 500 MG tablet Take 500 mg by mouth daily.   atorvastatin (LIPITOR) 80 MG tablet TAKE 1/2 TABLET BY MOUTH DAILY   B Complex Vitamins (B COMPLEX PO) Take 1 tablet by mouth daily.   carvedilol (COREG) 12.5 MG tablet TAKE 1 TABLET BY MOUTH TWICE DAILY WITH A MEAL   cholecalciferol (VITAMIN D) 1000 UNITS tablet Take 1,000 Units by mouth daily.   Ferrous Sulfate (IRON) 325 (65 Fe) MG TABS Take 1 tablet (325 mg total) by mouth every other day.   Fluticasone-Umeclidin-Vilant (TRELEGY ELLIPTA) 100-62.5-25 MCG/ACT AEPB Inhale 100 mcg into the lungs daily.   furosemide (LASIX) 40 MG tablet TAKE 1 TABLET BY MOUTH 1 TO 2 TIMES PER DAY AS NEEDED FOR FLUID RETENTION OR ANKLE SWELLING   levothyroxine (SYNTHROID) 100 MCG tablet Take  1 tablet  Daily  on an empty stomach with only water for 30 minutes & no Antacid meds, Calcium or Magnesium for 4 hours & avoid Biotin                                                                                 /                                          TAKE  BY                                                  MOUTH   loratadine (CLARITIN) 10 MG tablet Take 10 mg by mouth daily.   metFORMIN (GLUCOPHAGE-XR) 500 MG 24 hr tablet Take 2 tablets in the morning and 2 tablets in the evening.   olmesartan (BENICAR) 40 MG tablet TAKE 1 TABLET BY MOUTH DAILY FOR BLOOD PRESSURE   Omega-3 Fatty Acids (FISH OIL) 1200 MG CAPS Take 1,200 mg by mouth daily.   pantoprazole (PROTONIX) 40 MG tablet TAKE 1 TABLET BY MOUTH EVERY DAY BEFORE BREAKFAST   potassium chloride SA (KLOR-CON M) 20 MEQ tablet Take 2 tablets (40 mEq total) by mouth daily for 7 days.   warfarin (COUMADIN) 5 MG tablet TAKE 1 TABLET BY MOUTH DAILY. EXCEPT 1 AND 1/2 TABLETS ON MONDAYS OR AS DIRECTED BY COAGULATION CLINIC   zinc gluconate 50 MG tablet Take 50 mg by mouth daily.     Allergies:   Amiodarone and Bee venom   Social History   Tobacco Use   Smoking status: Never   Smokeless tobacco: Never  Vaping Use   Vaping status: Never Used  Substance Use Topics   Alcohol use: No   Drug use: No     Family Hx: The patient's family history includes Colon cancer in his sister; Liver disease in his brother, father, mother, and sister; Lung cancer in his father and mother; Prostate cancer in his brother. There is no history of Stomach cancer, Pancreatic cancer, or Esophageal cancer.  ROS:   Please see the history of present illness.     All other systems reviewed and are negative.   Labs/Other Tests and Data Reviewed:    Recent Labs: 12/10/2022: Magnesium 1.4 03/17/2023: ALT 10; BUN 19; Creat 1.56; Hemoglobin 13.6; Platelets 311; Potassium 3.5; Sodium 142; TSH 4.13   Recent Lipid Panel Lab Results  Component Value Date/Time   CHOL 124 03/17/2023 03:05 PM   TRIG 225 (H) 03/17/2023 03:05 PM   HDL 40 03/17/2023 03:05 PM   CHOLHDL 3.1 03/17/2023 03:05 PM   LDLCALC 56 03/17/2023 03:05 PM    Wt Readings from Last 3 Encounters:   06/10/23 230 lb 12.8 oz (104.7 kg)  04/14/23 244 lb 9.6 oz (110.9 kg)  03/17/23 246 lb (111.6 kg)     Objective:    Vital Signs:  BP 132/62   Pulse 66   Ht 5\' 10"  (1.778 m)   Wt 230 lb 12.8 oz (104.7 kg)   SpO2 95%   BMI 33.12 kg/m   GEN: Well nourished, well developed in no acute distress HEENT: Normal NECK: No JVD; No carotid bruits LYMPHATICS: No lymphadenopathy CARDIAC:RRR, no murmurs, rubs, gallops RESPIRATORY:  Clear to auscultation without rales, wheezing or rhonchi  ABDOMEN: Soft, non-tender, non-distended MUSCULOSKELETAL:  1+ BLE edema; No deformity  SKIN: Warm and dry NEUROLOGIC:  Alert and oriented x 3 PSYCHIATRIC:  Normal affect  ASSESSMENT & PLAN:    1.  ASCAD/SOB -cath 2017 showed 50% LM, 40% distal LAD, 70% ost to prox LCX, 40% mid LAD and 95% mid to dist RCA s/p PCI with DES x 2 and stage PCI of the LCx/OM.    -Nuclear stress test 04/2021 showed no ischemia -He denies any anginal chest pain but has been having worsening  SOB similar to what he had when he was on Amio>>he has an appt with Pulmonary soon -will check 2D echo to reassess LVF -recommend Stress PET CT to rule out SOB as an anginal equivalent with ischemia -Informed Consent   Shared Decision Making/Informed Consent The risks [chest pain, shortness of breath, cardiac arrhythmias, dizziness, blood pressure fluctuations, myocardial infarction, stroke/transient ischemic attack, nausea, vomiting, allergic reaction, radiation exposure, metallic taste sensation and life-threatening complications (estimated to be 1 in 10,000)], benefits (risk stratification, diagnosing coronary artery disease, treatment guidance) and alternatives of a cardiac PET stress test were discussed in detail with Mr. Mccomiskey and he agrees to proceed.    -Continue prescription drug management with Amlodipine 10mg  daily, Carvedilol 12.5mg  BID and statin therapy -no ASA due to DOAC  2.  HTN  -BP controlled on exam today -continue  prescription drug management with Amlodipine 10mg  daily, Carvedilol 12.5mg  BID and Olmesartan 40mg  daily with PRN refills.  -I have personally reviewed and interpreted outside labs performed by patient's PCP which showed SCr 1.56 and K+ 3.5 on 03/17/2023   3.  Persistent atrial fibrillation  -he is status post [name] ablation in 2018  -amiodarone stopped due to possible lung toxicity.  -He continues to maintain NSR and has not had any palpitations or bleeding problems on DOAC -continue prescription drug management with warfarin  -I have personally reviewed and interpreted outside labs performed by patient's PCP which showed Hbg 13.6 on 03/17/23  4.  Bilateral carotid artery stenosis  -Dopplers 03/2020 showed 1 to 39% bilateral stenosis and right subclavian stenosis -doppler 06/2022 unchanged -continue statin  5.  Hyperlipidemia  -LDL goal is less than 70.   -I have personally reviewed and interpreted outside labs performed by patient's PCP which showed LDL 56 and HDL 40, ALT 10 on 03/17/23 -continue prescription drug management with Atorvastatin 40mg  daily with PRN refills  Medication Adjustments/Labs and Tests Ordered: Current medicines are reviewed at length with the patient today.  Concerns regarding medicines are outlined above.  Tests Ordered: No orders of the defined types were placed in this encounter.   Medication Changes: No orders of the defined types were placed in this encounter.    Disposition:  Follow up in 1 year(s)  Signed, Armanda Magic, MD  06/10/2023 2:24 PM    [facility]

## 2023-06-11 ENCOUNTER — Ambulatory Visit: Payer: PPO | Attending: Cardiovascular Disease

## 2023-06-11 DIAGNOSIS — Z5181 Encounter for therapeutic drug level monitoring: Secondary | ICD-10-CM | POA: Diagnosis not present

## 2023-06-11 DIAGNOSIS — I4819 Other persistent atrial fibrillation: Secondary | ICD-10-CM | POA: Diagnosis not present

## 2023-06-11 LAB — POCT INR: INR: 2.4 (ref 2.0–3.0)

## 2023-06-11 LAB — BRAIN NATRIURETIC PEPTIDE: BNP: 81.8 pg/mL (ref 0.0–100.0)

## 2023-06-11 LAB — BASIC METABOLIC PANEL
BUN/Creatinine Ratio: 10 (ref 10–24)
BUN: 12 mg/dL (ref 8–27)
CO2: 22 mmol/L (ref 20–29)
Calcium: 9 mg/dL (ref 8.6–10.2)
Chloride: 101 mmol/L (ref 96–106)
Creatinine, Ser: 1.2 mg/dL (ref 0.76–1.27)
Glucose: 111 mg/dL — ABNORMAL HIGH (ref 70–99)
Potassium: 3.5 mmol/L (ref 3.5–5.2)
Sodium: 144 mmol/L (ref 134–144)
eGFR: 64 mL/min/{1.73_m2} (ref >59)

## 2023-06-11 NOTE — Patient Instructions (Signed)
continue taking Warfarin 1 tablet daily except 1.5 tablets on Mondays. Stay consistent with greens each week (2-3 times per week). Recheck INR in 6 weeks. Coumadin Clinic:[phone] or [phone] Fax number [phone]

## 2023-06-17 ENCOUNTER — Ambulatory Visit: Payer: Medicare Other | Admitting: Internal Medicine

## 2023-06-17 ENCOUNTER — Telehealth: Payer: Self-pay

## 2023-06-17 DIAGNOSIS — E876 Hypokalemia: Secondary | ICD-10-CM

## 2023-06-17 DIAGNOSIS — Z79899 Other long term (current) drug therapy: Secondary | ICD-10-CM

## 2023-06-17 MED ORDER — POTASSIUM CHLORIDE CRYS ER 20 MEQ PO TBCR: EXTENDED_RELEASE_TABLET | ORAL | 3 refills | Status: DC

## 2023-06-17 NOTE — Telephone Encounter (Signed)
-----   Message from Armanda Magic sent at 06/11/2023  8:13 AM EST ----- Please find out if patient is still taking potassium

## 2023-06-17 NOTE — Telephone Encounter (Signed)
-----   Message from Christopher Wade sent at 06/11/2023  8:13 AM EST ----- Please find out if patient is still taking potassium

## 2023-06-17 NOTE — Telephone Encounter (Signed)
Call back to patient to advise that Dr. Mayford Knife recommends he take 2 tablets of K-Dur today and then 1 tablet anytime he has to take lasix for fluid retention or leg swelling. Patient verbalizes understanding and agrees to complete BMET in one week. Orders placed.

## 2023-06-17 NOTE — Addendum Note (Signed)
Addended by: Luellen Pucker on: 06/17/2023 02:21 PM   Modules accepted: Orders

## 2023-06-17 NOTE — Telephone Encounter (Signed)
Call to patient, no answer, left message with no identifiers asking recipient to call [place] at our office #.

## 2023-06-17 NOTE — Telephone Encounter (Signed)
Call to patient to advise that overall labs look good. Patient verifies he has not been taking potassium in some time, removed from his med list. Advised to continue with current medications. Patient responses forwarded to Dr. Mayford Knife.

## 2023-06-24 ENCOUNTER — Other Ambulatory Visit: Payer: Self-pay

## 2023-06-24 DIAGNOSIS — Z79899 Other long term (current) drug therapy: Secondary | ICD-10-CM

## 2023-06-24 DIAGNOSIS — E876 Hypokalemia: Secondary | ICD-10-CM

## 2023-06-25 LAB — BASIC METABOLIC PANEL
BUN/Creatinine Ratio: 8 — ABNORMAL LOW (ref 10–24)
BUN: 10 mg/dL (ref 8–27)
CO2: 25 mmol/L (ref 20–29)
Calcium: 9.1 mg/dL (ref 8.6–10.2)
Chloride: 99 mmol/L (ref 96–106)
Creatinine, Ser: 1.23 mg/dL (ref 0.76–1.27)
Glucose: 126 mg/dL — ABNORMAL HIGH (ref 70–99)
Potassium: 3.5 mmol/L (ref 3.5–5.2)
Sodium: 142 mmol/L (ref 134–144)
eGFR: 62 mL/min/{1.73_m2} (ref >59)

## 2023-06-28 ENCOUNTER — Telehealth: Payer: Self-pay

## 2023-06-28 NOTE — Telephone Encounter (Signed)
-----   Message from Nurse Kinnie Feil C sent at 06/25/2023  3:00 PM EST -----  ----- Message ----- From: [name], MD Sent: 06/25/2023   7:41 AM EST To: [name], MD; Frutoso Schatz, RN  Stable labs - continue current meds and forward to PCP

## 2023-06-28 NOTE — Telephone Encounter (Signed)
Call to patient to discuss labs, no answer, unable to leave message.

## 2023-06-30 ENCOUNTER — Telehealth: Payer: Self-pay | Admitting: Cardiology

## 2023-06-30 ENCOUNTER — Ambulatory Visit: Payer: PPO | Admitting: Pulmonary Disease

## 2023-06-30 ENCOUNTER — Encounter: Payer: Self-pay | Admitting: Pulmonary Disease

## 2023-06-30 VITALS — BP 142/68 | HR 69 | Ht 70.0 in | Wt 231.0 lb

## 2023-06-30 DIAGNOSIS — J849 Interstitial pulmonary disease, unspecified: Secondary | ICD-10-CM

## 2023-06-30 DIAGNOSIS — R0609 Other forms of dyspnea: Secondary | ICD-10-CM

## 2023-06-30 NOTE — Telephone Encounter (Signed)
 Left voicemail to return call to office

## 2023-06-30 NOTE — Progress Notes (Signed)
 @Patient  ID: Christopher Wade, male    DOB: 02-Jan-1951, 73 y.o.   MRN: [id number]  Chief Complaint  Patient presents with   Consult    Pt states he's been having SOB, hard time when walking etc    Referring provider: Laurice President, NP  HPI:   73 y.o. man whom are seen for evaluation of dyspnea on exertion.  Multiple prior PCP notes reviewed.  Prior pulmonary notes from Dr.  Geronimo reviewed.  Shortness of breath for some time.  Couple years.  Worse with inclines or stairs.  No time of day when things are better or worse.  No position that make things better or worse.  No seasonal or environmental factors he can identify that make things better or worse.  Noted 03/2023 to be hypoxemic with exertion of 74% in PCP office.  No oxygen was ordered.  Referred to pulmonary.  Not clear why oxygen was not ordered.  In the past on the inhalers did not help very much.  He had most recent CT scan high-resolution chest 01/2017 was clear, no signs of ILD on my review and interpretation.  There was some concern for possible early UIP with interlobular septal thickening but there was no honeycombing, frankly I see very mild if any interlobular septal thickening.  In 2022 he had a TTE that showed a dilated left atrium, likely diastolic dysfunction.  Most recent chest imaging with a chest x-ray 11/2022 that shows enlarged heart, clear lungs, right hemidiaphragm elevation.  Questionaires / Pulmonary Flowsheets:   ACT:      No data to display          MMRC:     No data to display          Epworth:      No data to display          Tests:   FENO:  No results found for: NITRICOXIDE  PFT:    Latest Ref Rng & Units 11/11/2015    2:48 PM  PFT Results  FVC-Pre L 3.41   FVC-Predicted Pre % 71   FVC-Post L 3.57   FVC-Predicted Post % 74   Pre FEV1/FVC % % 75   Post FEV1/FCV % % 78   FEV1-Pre L 2.55   FEV1-Predicted Pre % 71   FEV1-Post L 2.77   DLCO uncorrected ml/min/mmHg  18.89   DLCO UNC% % 56   DLCO corrected ml/min/mmHg 18.34   DLCO COR %Predicted % 54   DLVA Predicted % 71   TLC L 5.42   TLC % Predicted % 75   RV % Predicted % 80   Personally reviewed no fixed obstruction, no bronchodilator response, lung volumes mildly reduced, DLCO moderately reduced  WALK:     02/02/2017    3:50 PM 07/29/2016   12:47 PM 01/13/2016    2:18 PM  SIX MIN WALK  Supplimental Oxygen during Test? (L/min) No No No    Imaging: Personally reviewed and as per EMR and discussion in this note No results found.  Lab Results: Personally reviewed CBC    Component Value Date/Time   WBC 9.4 03/17/2023 1505   RBC 4.09 (L) 03/17/2023 1505   HGB 13.6 03/17/2023 1505   HGB 13.3 06/01/2016 1240   HCT 40.6 03/17/2023 1505   HCT 39.2 06/01/2016 1240   PLT 311 03/17/2023 1505   PLT 240 06/01/2016 1240   MCV 99.3 03/17/2023 1505   MCV 97 06/01/2016 1240   MCH 33.3 (  H) 03/17/2023 1505   MCHC 33.5 03/17/2023 1505   RDW 14.4 03/17/2023 1505   RDW 15.2 06/01/2016 1240   LYMPHSABS 1,649 12/30/2022 1620   LYMPHSABS 0.9 06/01/2016 1240   MONOABS 0.8 11/27/2022 2351   EOSABS 141 03/17/2023 1505   EOSABS 0.1 06/01/2016 1240   BASOSABS 47 03/17/2023 1505   BASOSABS 0.0 06/01/2016 1240    BMET    Component Value Date/Time   NA 142 06/24/2023 1233   K 3.5 06/24/2023 1233   CL 99 06/24/2023 1233   CO2 25 06/24/2023 1233   GLUCOSE 126 (H) 06/24/2023 1233   GLUCOSE 143 (H) 03/17/2023 1505   BUN 10 06/24/2023 1233   CREATININE 1.23 06/24/2023 1233   CREATININE 1.56 (H) 03/17/2023 1505   CALCIUM  9.1 06/24/2023 1233   GFRNONAA >60 12/22/2022 1450   GFRNONAA 71 11/06/2020 1506   GFRAA 82 11/06/2020 1506    BNP    Component Value Date/Time   BNP 81.8 06/10/2023 1535   BNP 86.8 05/27/2020 0829    ProBNP    Component Value Date/Time   PROBNP 238 08/14/2021 1122    Specialty Problems       Pulmonary Problems   OSA (obstructive sleep apnea)   noncompliant with  CPAP      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, 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 2018; non-smoker, Possible UIP with  summer 2017 through Feb 2018    Allergies  Allergen Reactions   Amiodarone  Other (See Comments)    Lung toxicity   Bee Venom Other (See Comments)    Leg went numb    Immunization History  Administered Date(s) Administered   Influenza Split 04/05/2014   Influenza, High Dose Seasonal PF 03/31/2018, 03/20/2019, 05/07/2020, 05/28/2021, 03/17/2023   Influenza-Unspecified 02/02/2013, 05/21/2016   PFIZER Comirnaty(Gray Top)Covid-19 Tri-Sucrose Vaccine 05/30/2021   PFIZER(Purple Top)SARS-COV-2 Vaccination 12/13/2019, 01/03/2020, 07/09/2020   Pfizer(Comirnaty)Fall Seasonal Vaccine 12 years and older 03/17/2023   Pneumococcal Conjugate-13 05/01/2014   Pneumococcal Polysaccharide-23 05/16/2015   Td 04/25/2010, 05/07/2020   Varicella Zoster Immune Globulin 02/22/2014    Past Medical History:  Diagnosis Date   AKI (acute kidney injury) (HCC) 05/27/2020   Arthritis    joints get stiff (06/11/2016)   Bee sting allergy    Bee sting allergy    CAD (coronary artery disease), native coronary artery 02/12/2016   cath showing 50% LM, 40% distal LAD, 70% ost to prox LCs, 40% mid LAD and 95% mid to dist RCA s/p PCI with DES x 2 and stage PCI of the LCx/OM.   Carotid artery stenosis    1-39% bilateral carotid stenosis by dopples 06/2022   Duodenal ulcer    History of gout    Hx of adenomatous colonic polyps 04/10/2008   Hyperlipidemia    Hypertension    Hypothyroidism    OSA (obstructive sleep apnea)    tried CPAP; couldn't do it (06/11/2016)   Persistent atrial fibrillation (HCC)    s/p DCCV 03/2015 with reversion back to atrial fibrillation, loaded with Amio and s/p DCCV to NSR 11/2015.  He is now s/p afib albation.  He had amio lung toxicity and amio was stopped.   Subclavian artery stenosis (HCC)    right subclavian artery stenosis by dopplers 2/24    Thyroid  disease    Type II diabetes mellitus (HCC)    Vitamin D  deficiency     Tobacco History: Social History   Tobacco Use  Smoking Status Never  Smokeless  Tobacco Never   Counseling given: Not Answered   Continue to not smoke  Outpatient Encounter Medications as of 06/30/2023  Medication Sig   acetaminophen  (TYLENOL ) 325 MG tablet Take 325 mg by mouth as needed for mild pain.   albuterol  (VENTOLIN  HFA) 108 (90 Base) MCG/ACT inhaler Inhale 2 puffs into the lungs every 6 (six) hours as needed for wheezing or shortness of breath.   allopurinol  (ZYLOPRIM ) 300 MG tablet TAKE 1 TABLET BY MOUTH DAILY FOR GOUT PREVENTION   amLODipine  (NORVASC ) 10 MG tablet Take 1 tab  Daily For BP & Heart   Ascorbic Acid (VITAMIN C WITH ROSE HIPS) 500 MG tablet Take 500 mg by mouth daily.   atorvastatin  (LIPITOR ) 80 MG tablet TAKE 1/2 TABLET BY MOUTH DAILY   B Complex Vitamins (B COMPLEX PO) Take 1 tablet by mouth daily.   carvedilol  (COREG ) 12.5 MG tablet TAKE 1 TABLET BY MOUTH TWICE DAILY WITH A MEAL   cholecalciferol  (VITAMIN D ) 1000 UNITS tablet Take 1,000 Units by mouth daily.   Ferrous Sulfate (IRON ) 325 (65 Fe) MG TABS Take 1 tablet (325 mg total) by mouth every other day.   Fluticasone-Umeclidin-Vilant (TRELEGY ELLIPTA ) 100-62.5-25 MCG/ACT AEPB Inhale 100 mcg into the lungs daily.   furosemide  (LASIX ) 40 MG tablet TAKE 1 TABLET BY MOUTH 1 TO 2 TIMES PER DAY AS NEEDED FOR FLUID RETENTION OR ANKLE SWELLING   levothyroxine  (SYNTHROID ) 100 MCG tablet Take  1 tablet  Daily  on an empty stomach with only water for 30 minutes & no Antacid meds, Calcium  or Magnesium  for 4 hours & avoid Biotin                                                                                 /                                         TAKE                                                     BY                                                  MOUTH   loratadine  (CLARITIN ) 10 MG tablet Take 10 mg by mouth daily.   magnesium   gluconate (MAGONATE) 500 MG tablet Take 500mg  TID and with meals.   metFORMIN  (GLUCOPHAGE -XR) 500 MG 24 hr tablet Take 2 tablets in the morning and 2 tablets in the evening.   olmesartan  (BENICAR ) 40 MG tablet TAKE 1 TABLET BY MOUTH DAILY FOR BLOOD PRESSURE   Omega-3 Fatty Acids (FISH OIL) 1200 MG CAPS Take 1,200 mg by mouth daily.   pantoprazole  (PROTONIX ) 40 MG tablet TAKE 1 TABLET BY MOUTH EVERY DAY BEFORE BREAKFAST  potassium chloride  SA (KLOR-CON  M) 20 MEQ tablet Take 2 tablets (40 mEq total) by mouth daily for 1 day, THEN 1 tablet (20 mEq total) as needed (Take 2 tablets the first day, then take one tablet anytime you have to take your lasix  for fluid retention or ankle swelling.).   warfarin (COUMADIN ) 5 MG tablet TAKE 1 TABLET BY MOUTH DAILY. EXCEPT 1 AND 1/2 TABLETS ON MONDAYS OR AS DIRECTED BY COAGULATION CLINIC   zinc gluconate 50 MG tablet Take 50 mg by mouth daily.   No facility-administered encounter medications on file as of 06/30/2023.     Review of Systems  Review of Systems  No chest pain with exertion.  Orthopnea or PND.  Comprehensive review of systems otherwise negative. Physical Exam  BP (!) 142/68 (BP Location: Left Arm, Patient Position: Sitting, Cuff Size: Normal)   Pulse 69   Ht 5' 10 (1.778 m)   Wt 231 lb (104.8 kg)   SpO2 94%   BMI 33.15 kg/m   Wt Readings from Last 5 Encounters:  06/30/23 231 lb (104.8 kg)  06/10/23 230 lb 12.8 oz (104.7 kg)  04/14/23 244 lb 9.6 oz (110.9 kg)  03/17/23 246 lb (111.6 kg)  12/30/22 230 lb (104.3 kg)    BMI Readings from Last 5 Encounters:  06/30/23 33.15 kg/m  06/10/23 33.12 kg/m  04/14/23 35.10 kg/m  03/17/23 35.30 kg/m  12/30/22 33.00 kg/m     Physical Exam General: Sitting in chair, no acute distress Eyes: EOMI, no icterus Neck: Supple, no JVP Pulmonary: Diminished throughout on the right but particular in the right base, otherwise clear Cardiovascular: Warm, minimal edema in the  extremities Abdomen: Nondistended, sounds present MSK: No synovitis, no joint effusion Neuro: Normal gait, no weakness Psych: Normal mood, full affect   Assessment & Plan:   Dyspnea on exertion: Suspect largely driven by cardiac abnormalities and development of deconditioning.  He has dilated left atrium, likely diastolic dysfunction on TTE 2022.  Most recent chest x-ray is clear, enlarged heart, right hemidiaphragm elevated 11/2022.  Suspect diaphragmatic paralysis which certainly can be contributing.  Low suspicion for ILD.  Lung exam is clear.  CT high-res 01/2017 was clear.  PFTs for further evaluation.  CT high-resolution of the chest also for further evaluation.  Reported exertional hypoxemia: Note from PCP office 03/2023 documents desaturation to 74% on room air with exertion.  No oxygen was prescribed.  No explanation for this.  Ambulated today and briefly dropped to 86% but quickly recovered before oxygen could be applied.  I suspect this could be related to atelectasis with presumed right hemidiaphragm paralysis.  High-resolution CT scan ordered as above.  Elevated right hemidiaphragm: Sniff test to evaluate for possible paralysis.   Return in about 2 months (around 08/28/2023) for f/u Dr. Annella.   Donnice JONELLE Annella, MD 06/30/2023   This appointment required 61 minutes of patient care (this includes precharting, chart review, review of results, face-to-face care, etc.).

## 2023-06-30 NOTE — Telephone Encounter (Signed)
 Patient returned call for lab results. Please return call after 3:30pm today.

## 2023-06-30 NOTE — Patient Instructions (Signed)
 We need pulmonary function test, high high-resolution CT of the chest, and a sniff test to figure out what is going on.  Your last CT scan 2018 had no evidence of scarring etc. which is good  I do think the right diaphragm of breathing muscle is high and I worry is not working well this can cause shortness of breath  I am not sure why when your oxygen was 74% in your PCP office in November that no one ordered oxygen.  Return to clinic in 2 months after PFTs and CT scan

## 2023-06-30 NOTE — Telephone Encounter (Signed)
Patient is returning phone call about his results

## 2023-06-30 NOTE — Telephone Encounter (Signed)
 Left the pt a message to call the office back to discuss lab results per Dr. Micael Adas from 06/25/23.  Jacqueline Matsu, MD 06/25/2023  7:41 AM EST     Stable labs - continue current meds and forward to PCP

## 2023-07-01 ENCOUNTER — Ambulatory Visit (HOSPITAL_COMMUNITY): Payer: PPO | Attending: Internal Medicine

## 2023-07-01 ENCOUNTER — Telehealth: Payer: Self-pay

## 2023-07-01 DIAGNOSIS — I2510 Atherosclerotic heart disease of native coronary artery without angina pectoris: Secondary | ICD-10-CM | POA: Diagnosis present

## 2023-07-01 DIAGNOSIS — E1169 Type 2 diabetes mellitus with other specified complication: Secondary | ICD-10-CM | POA: Diagnosis present

## 2023-07-01 DIAGNOSIS — E785 Hyperlipidemia, unspecified: Secondary | ICD-10-CM | POA: Diagnosis present

## 2023-07-01 DIAGNOSIS — I10 Essential (primary) hypertension: Secondary | ICD-10-CM | POA: Insufficient documentation

## 2023-07-01 DIAGNOSIS — I4819 Other persistent atrial fibrillation: Secondary | ICD-10-CM | POA: Diagnosis not present

## 2023-07-01 DIAGNOSIS — I272 Pulmonary hypertension, unspecified: Secondary | ICD-10-CM

## 2023-07-01 DIAGNOSIS — R0600 Dyspnea, unspecified: Secondary | ICD-10-CM | POA: Diagnosis present

## 2023-07-01 DIAGNOSIS — I517 Cardiomegaly: Secondary | ICD-10-CM

## 2023-07-01 DIAGNOSIS — I6523 Occlusion and stenosis of bilateral carotid arteries: Secondary | ICD-10-CM | POA: Insufficient documentation

## 2023-07-01 DIAGNOSIS — I2699 Other pulmonary embolism without acute cor pulmonale: Secondary | ICD-10-CM

## 2023-07-01 LAB — ECHOCARDIOGRAM COMPLETE
Area-P 1/2: 4.15 cm2
Est EF: 60
S' Lateral: 2.85 cm

## 2023-07-01 NOTE — Telephone Encounter (Signed)
 Patient was in for echo and stated he had a voicemail about his labs.  Advised patient per Dr. Shlomo labs are stable and he can continue on his current medications.   He stated he was prescribed potassium supplement but stopped it 3 days ago because he was going to the bathroom too much.  Informed patient that its his lasix  that is making him go to the bathroom which means its working the way it needs to and he need to continue with his potassium supplement. He stated he start back taking his potassium.

## 2023-07-01 NOTE — Telephone Encounter (Signed)
 Spoke with patient about labs please see duplicate encounter

## 2023-07-05 NOTE — Telephone Encounter (Signed)
 Call to patient to discuss results.  Patient verbalizes understanding of normal BMET values.  Discussed 2D echo which showed normal heart function with EF 60%. Explained mildly thickened heart muscle, mildly dilated right ventricle, mild left atrial enlargement and mild pulmonary hypertension present.   Explained that because some of this is new from 2022 echo, Dr. Micael Adas would like him to would like him to have an in lab sleep study to rule out sleep apnea. Patient agrees to plan. Also explained Dr. Micael Adas would also like him to have PFTs with DLCO and VQ scan to rule out PE, patient states his PCP already scheduled those for him and they are in fact scheduled.

## 2023-07-05 NOTE — Telephone Encounter (Signed)
 Patient is following up. He states he received an additional call regarding his lab work. Please advise.

## 2023-07-07 ENCOUNTER — Other Ambulatory Visit: Payer: Self-pay

## 2023-07-07 DIAGNOSIS — I10 Essential (primary) hypertension: Secondary | ICD-10-CM

## 2023-07-07 MED ORDER — OLMESARTAN MEDOXOMIL 40 MG PO TABS: 40.0000 mg | ORAL_TABLET | Freq: Every day | ORAL | 0 refills | Status: DC

## 2023-07-20 ENCOUNTER — Ambulatory Visit: Payer: PPO | Attending: Cardiology | Admitting: *Deleted

## 2023-07-20 DIAGNOSIS — I4819 Other persistent atrial fibrillation: Secondary | ICD-10-CM

## 2023-07-20 DIAGNOSIS — Z5181 Encounter for therapeutic drug level monitoring: Secondary | ICD-10-CM

## 2023-07-20 LAB — POCT INR: INR: 1.8 — AB (ref 2.0–3.0)

## 2023-07-20 NOTE — Patient Instructions (Signed)
 Description   Today take 1.5 tablets of warfarin then continue taking Warfarin 1 tablet daily except 1.5 tablets on Mondays. Stay consistent with greens each week (2-3 times per week). Recheck INR in 5 weeks. Coumadin Clinic:[phone] or [phone] Fax number [phone]

## 2023-07-23 ENCOUNTER — Ambulatory Visit
Admission: RE | Admit: 2023-07-23 | Discharge: 2023-07-23 | Disposition: A | Discharge status: Home / Self Care | Payer: PPO | Source: Ambulatory Visit | Attending: Pulmonary Disease | Admitting: Pulmonary Disease

## 2023-07-23 DIAGNOSIS — J849 Interstitial pulmonary disease, unspecified: Secondary | ICD-10-CM

## 2023-07-23 DIAGNOSIS — R0609 Other forms of dyspnea: Secondary | ICD-10-CM

## 2023-08-02 ENCOUNTER — Telehealth (HOSPITAL_COMMUNITY): Payer: Self-pay | Admitting: *Deleted

## 2023-08-02 NOTE — Telephone Encounter (Signed)

## 2023-08-02 NOTE — Telephone Encounter (Signed)
 Attempted to call patient regarding upcoming cardiac PET appointment. Left message on voicemail with name and callback number Johney Frame RN Navigator Cardiac Imaging [facility] [phone] Office  Advised to avoid caffeine for 12 hours prior to test.

## 2023-08-03 ENCOUNTER — Ambulatory Visit (HOSPITAL_COMMUNITY)
Admission: RE | Admit: 2023-08-03 | Discharge: 2023-08-03 | Disposition: A | Discharge status: Home / Self Care | Payer: PPO | Source: Ambulatory Visit | Attending: Cardiology | Admitting: Cardiology

## 2023-08-03 DIAGNOSIS — R0600 Dyspnea, unspecified: Secondary | ICD-10-CM | POA: Diagnosis present

## 2023-08-03 DIAGNOSIS — I10 Essential (primary) hypertension: Secondary | ICD-10-CM | POA: Insufficient documentation

## 2023-08-03 DIAGNOSIS — E785 Hyperlipidemia, unspecified: Secondary | ICD-10-CM | POA: Diagnosis present

## 2023-08-03 DIAGNOSIS — I4819 Other persistent atrial fibrillation: Secondary | ICD-10-CM | POA: Diagnosis present

## 2023-08-03 DIAGNOSIS — E1169 Type 2 diabetes mellitus with other specified complication: Secondary | ICD-10-CM | POA: Insufficient documentation

## 2023-08-03 DIAGNOSIS — I6523 Occlusion and stenosis of bilateral carotid arteries: Secondary | ICD-10-CM | POA: Insufficient documentation

## 2023-08-03 DIAGNOSIS — I2510 Atherosclerotic heart disease of native coronary artery without angina pectoris: Secondary | ICD-10-CM | POA: Diagnosis not present

## 2023-08-03 LAB — NM PET CT CARDIAC PERFUSION MULTI W/ABSOLUTE BLOODFLOW
LV dias vol: 153 mL (ref 62–150)
LV sys vol: 63 mL
MBFR: 1.43
Nuc Rest EF: 59 %
Nuc Stress EF: 62 %
Rest MBF: 1.2 ml/g/min
Rest Nuclear Isotope Dose: 26.3 mCi
ST Depression (mm): 0 mm
Stress MBF: 1.71 ml/g/min
Stress Nuclear Isotope Dose: 26.4 mCi

## 2023-08-03 MED ORDER — REGADENOSON 0.4 MG/5ML IV SOLN
0.4000 mg | Freq: Once | INTRAVENOUS | Status: AC
  Administered 2023-08-03: 0.4 mg via INTRAVENOUS

## 2023-08-03 MED ORDER — REGADENOSON 0.4 MG/5ML IV SOLN
INTRAVENOUS | Status: AC
Start: 2023-08-03 — End: ?
  Filled 2023-08-03: qty 5

## 2023-08-03 MED ORDER — RUBIDIUM RB82 GENERATOR (RUBYFILL)
26.3000 | PACK | Freq: Once | INTRAVENOUS | Status: AC
  Administered 2023-08-03: 26.3 via INTRAVENOUS

## 2023-08-03 MED ORDER — RUBIDIUM RB82 GENERATOR (RUBYFILL)
26.4000 | PACK | Freq: Once | INTRAVENOUS | Status: AC
  Administered 2023-08-03: 26.4 via INTRAVENOUS

## 2023-08-17 NOTE — Progress Notes (Unsigned)
 Electrophysiology Office Note:   Date:  08/18/2023  ID:  Christopher Wade, DOB February 19, 1951, MRN [id number]  Primary Cardiologist: Armanda Magic, MD Primary Heart Failure: None Electrophysiologist: Jadaya Sommerfield Jorja Loa, MD      History of Present Illness:   Christopher Wade is a 73 y.o. male with h/o atrial fibrillation, hypertension, diabetes, hyperlipidemia, coronary artery disease post RCA stents seen today for routine electrophysiology followup.   Since last being seen in our clinic the patient reports fatigue and shortness of breath.  He also has chronic lower extremity edema.  He had an echo that showed a normal ejection fraction and no diastolic dysfunction.  He had a PET scan that showed no ischemia.  He continues to have these issues.  He is concerned that it may be his warfarin it is causing some of these issues.  He was given information by a pharmacist but said that warfarin may have similar side effects.  His he has follow-up with a pulmonologist in the future and is working to get a new PCP..  he denies chest pain, palpitations, dyspnea, PND, orthopnea, nausea, vomiting, dizziness, syncope, edema, weight gain, or early satiety.   Review of systems complete and found to be negative unless listed in HPI.   EP Information / Studies Reviewed:    EKG is ordered today. Personal review as below.  EKG Interpretation Date/Time:  Wednesday August 18 2023 09:10:50 EDT Ventricular Rate:  81 PR Interval:  178 QRS Duration:  86 QT Interval:  384 QTC Calculation: 446 R Axis:   117  Text Interpretation: Sinus rhythm with Premature ventricular complexes When compared with ECG of 22-Dec-2022 14:34, No significant change since last tracing Confirmed by Donyale Berthold ([id number]) on 08/18/2023 9:14:56 AM     Risk Assessment/Calculations:    CHA2DS2-VASc Score = 4   This indicates a 4.8% annual risk of stroke. The patient's score is based upon: CHF History: 0 HTN History: 1 Diabetes History:  1 Stroke History: 0 Vascular Disease History: 1 Age Score: 1 Gender Score: 0            Physical Exam:   VS:  BP (!) 160/72 (BP Location: Left Arm, Patient Position: Sitting, Cuff Size: Large)   Pulse 81   Ht 5\' 10"  (1.778 m)   Wt 226 lb (102.5 kg)   SpO2 94%   BMI 32.43 kg/m    Wt Readings from Last 3 Encounters:  08/18/23 226 lb (102.5 kg)  06/30/23 231 lb (104.8 kg)  06/10/23 230 lb 12.8 oz (104.7 kg)     GEN: Well nourished, well developed in no acute distress NECK: No JVD; No carotid bruits CARDIAC: Regular rate and rhythm, no murmurs, rubs, gallops RESPIRATORY:  Clear to auscultation without rales, wheezing or rhonchi  ABDOMEN: Soft, non-tender, non-distended EXTREMITIES:  No edema; No deformity   ASSESSMENT AND PLAN:    1.  Persistent atrial fibrillation/flutter: Post ablation 06/11/2016.  He has had no further episodes of atrial fibrillation.  He may want to stop his warfarin in the future.  We discussed ILR implant to monitor for atrial fibrillation.  He Signa Cheek think about this further.  The interim, we Jeanie Mccard make sure that his insurance Emmalise Huard cover this if there is a plan to implant at his next visit.  If he does not wish to implanted his next visit, he can follow-up with general cardiology and EP Daaiel Starlin see him back as needed.  2.  Secondary hypercoagulable state: Currently on warfarin for atrial  fibrillation  3.  Coronary artery disease: Post RCA PCI.  Plan per primary cardiology  4.  Hypertension: Elevated today.  Better controlled at home.  No changes.  5.  Obstructive sleep apnea: CPAP compliance encouraged  Follow up with EP APP in 6 months  Signed, Kesley Mullens Jorja Loa, MD

## 2023-08-18 ENCOUNTER — Ambulatory Visit: Payer: PPO | Attending: Cardiology | Admitting: Cardiology

## 2023-08-18 ENCOUNTER — Encounter: Payer: Self-pay | Admitting: Cardiology

## 2023-08-18 VITALS — BP 160/72 | HR 81 | Ht 70.0 in | Wt 226.0 lb

## 2023-08-18 DIAGNOSIS — G4733 Obstructive sleep apnea (adult) (pediatric): Secondary | ICD-10-CM

## 2023-08-18 DIAGNOSIS — I2510 Atherosclerotic heart disease of native coronary artery without angina pectoris: Secondary | ICD-10-CM | POA: Diagnosis not present

## 2023-08-18 DIAGNOSIS — D6869 Other thrombophilia: Secondary | ICD-10-CM

## 2023-08-18 DIAGNOSIS — I10 Essential (primary) hypertension: Secondary | ICD-10-CM | POA: Diagnosis not present

## 2023-08-18 DIAGNOSIS — I4819 Other persistent atrial fibrillation: Secondary | ICD-10-CM | POA: Diagnosis not present

## 2023-08-18 NOTE — Patient Instructions (Signed)
 Medication Instructions:  Your physician recommends that you continue on your current medications as directed. Please refer to the Current Medication list given to you today.  *If you need a refill on your cardiac medications before your next appointment, please call your pharmacy*   Lab Work: None ordered If you have labs (blood work) drawn today and your tests are completely normal, you will receive your results only by: MyChart Message (if you have MyChart) OR A paper copy in the mail If you have any lab test that is abnormal or we need to change your treatment, we will call you to review the results.   Testing/Procedures: None ordered   Follow-Up: At [facility], you and your health needs are our priority.  As part of our continuing mission to provide you with exceptional heart care, we have created designated Provider Care Teams.  These Care Teams include your primary Cardiologist (physician) and Advanced Practice Providers (APPs -  Physician Assistants and Nurse Practitioners) who all work together to provide you with the care you need, when you need it.  We recommend signing up for the patient portal called "MyChart".  Sign up information is provided on this After Visit Summary.  MyChart is used to connect with patients for Virtual Visits (Telemedicine).  Patients are able to view lab/test results, encounter notes, upcoming appointments, etc.  Non-urgent messages can be sent to your provider as well.   To learn more about what you can do with MyChart, go to [email or web address].    Your next appointment:   6 month(s)  The format for your next appointment:   In Person  Provider:   Francis Dowse, PA-C    Thank you for choosing [facility]!!   Dory Horn, RN [phone]

## 2023-08-19 ENCOUNTER — Ambulatory Visit: Payer: Medicare Other | Admitting: Internal Medicine

## 2023-08-24 ENCOUNTER — Ambulatory Visit: Payer: PPO | Attending: Cardiology

## 2023-08-24 DIAGNOSIS — Z5181 Encounter for therapeutic drug level monitoring: Secondary | ICD-10-CM | POA: Diagnosis not present

## 2023-08-24 DIAGNOSIS — I4819 Other persistent atrial fibrillation: Secondary | ICD-10-CM

## 2023-08-24 LAB — POCT INR: INR: 3.4 — AB (ref 2.0–3.0)

## 2023-08-24 NOTE — Patient Instructions (Addendum)
 continue taking Warfarin 1 tablet daily except 1.5 tablets on Mondays.  Eat greens tonight.  Stay consistent with greens each week (2-3 times per week). Recheck INR in 6 weeks. Coumadin Clinic:[phone] or [phone] Fax number [phone]

## 2023-09-01 ENCOUNTER — Ambulatory Visit: Payer: PPO | Admitting: Pulmonary Disease

## 2023-09-13 ENCOUNTER — Encounter: Payer: Self-pay | Admitting: Family Medicine

## 2023-09-13 ENCOUNTER — Ambulatory Visit (INDEPENDENT_AMBULATORY_CARE_PROVIDER_SITE_OTHER): Admitting: Family Medicine

## 2023-09-13 VITALS — BP 142/80 | HR 67 | Temp 98.2°F | Ht 70.0 in | Wt 240.0 lb

## 2023-09-13 DIAGNOSIS — E1122 Type 2 diabetes mellitus with diabetic chronic kidney disease: Secondary | ICD-10-CM | POA: Diagnosis not present

## 2023-09-13 DIAGNOSIS — E1169 Type 2 diabetes mellitus with other specified complication: Secondary | ICD-10-CM

## 2023-09-13 DIAGNOSIS — K2270 Barrett's esophagus without dysplasia: Secondary | ICD-10-CM

## 2023-09-13 DIAGNOSIS — E785 Hyperlipidemia, unspecified: Secondary | ICD-10-CM

## 2023-09-13 DIAGNOSIS — Z7689 Persons encountering health services in other specified circumstances: Secondary | ICD-10-CM

## 2023-09-13 DIAGNOSIS — R600 Localized edema: Secondary | ICD-10-CM

## 2023-09-13 DIAGNOSIS — N182 Chronic kidney disease, stage 2 (mild): Secondary | ICD-10-CM

## 2023-09-13 DIAGNOSIS — Z860101 Personal history of adenomatous and serrated colon polyps: Secondary | ICD-10-CM

## 2023-09-13 DIAGNOSIS — E559 Vitamin D deficiency, unspecified: Secondary | ICD-10-CM

## 2023-09-13 DIAGNOSIS — I10 Essential (primary) hypertension: Secondary | ICD-10-CM | POA: Diagnosis not present

## 2023-09-13 DIAGNOSIS — D509 Iron deficiency anemia, unspecified: Secondary | ICD-10-CM

## 2023-09-13 DIAGNOSIS — E039 Hypothyroidism, unspecified: Secondary | ICD-10-CM

## 2023-09-13 DIAGNOSIS — R0609 Other forms of dyspnea: Secondary | ICD-10-CM

## 2023-09-13 DIAGNOSIS — M1A9XX0 Chronic gout, unspecified, without tophus (tophi): Secondary | ICD-10-CM

## 2023-09-13 LAB — CBC WITH DIFFERENTIAL/PLATELET
Basophils Absolute: 0 10*3/uL (ref 0.0–0.1)
Basophils Relative: 0.5 % (ref 0.0–3.0)
Eosinophils Absolute: 0.2 10*3/uL (ref 0.0–0.7)
Eosinophils Relative: 2.4 % (ref 0.0–5.0)
HCT: 44 % (ref 39.0–52.0)
Hemoglobin: 14.3 g/dL (ref 13.0–17.0)
Lymphocytes Relative: 20.4 % (ref 12.0–46.0)
Lymphs Abs: 1.8 10*3/uL (ref 0.7–4.0)
MCHC: 32.5 g/dL (ref 30.0–36.0)
MCV: 101.6 fl — ABNORMAL HIGH (ref 78.0–100.0)
Monocytes Absolute: 0.9 10*3/uL (ref 0.1–1.0)
Monocytes Relative: 9.9 % (ref 3.0–12.0)
Neutro Abs: 5.8 10*3/uL (ref 1.4–7.7)
Neutrophils Relative %: 66.8 % (ref 43.0–77.0)
Platelets: 268 10*3/uL (ref 150.0–400.0)
RBC: 4.33 Mil/uL (ref 4.22–5.81)
RDW: 18 % — ABNORMAL HIGH (ref 11.5–15.5)
WBC: 8.6 10*3/uL (ref 4.0–10.5)

## 2023-09-13 LAB — VITAMIN D 25 HYDROXY (VIT D DEFICIENCY, FRACTURES): VITD: 32.79 ng/mL (ref 30.00–100.00)

## 2023-09-13 LAB — LIPID PANEL
Cholesterol: 138 mg/dL (ref 0–200)
HDL: 42.9 mg/dL (ref >39.00)
LDL Cholesterol: 55 mg/dL (ref 0–99)
NonHDL: 95.04
Total CHOL/HDL Ratio: 3
Triglycerides: 200 mg/dL — ABNORMAL HIGH (ref 0.0–149.0)
VLDL: 40 mg/dL (ref 0.0–40.0)

## 2023-09-13 LAB — COMPREHENSIVE METABOLIC PANEL WITH GFR
ALT: 12 U/L (ref 0–53)
AST: 14 U/L (ref 0–37)
Albumin: 4.5 g/dL (ref 3.5–5.2)
Alkaline Phosphatase: 78 U/L (ref 39–117)
BUN: 17 mg/dL (ref 6–23)
CO2: 30 meq/L (ref 19–32)
Calcium: 9.1 mg/dL (ref 8.4–10.5)
Chloride: 103 meq/L (ref 96–112)
Creatinine, Ser: 1.22 mg/dL (ref 0.40–1.50)
GFR: 58.86 mL/min — ABNORMAL LOW (ref >60.00)
Glucose, Bld: 137 mg/dL — ABNORMAL HIGH (ref 70–99)
Potassium: 4.1 meq/L (ref 3.5–5.1)
Sodium: 143 meq/L (ref 135–145)
Total Bilirubin: 0.7 mg/dL (ref 0.2–1.2)
Total Protein: 7.5 g/dL (ref 6.0–8.3)

## 2023-09-13 LAB — HEMOGLOBIN A1C: Hgb A1c MFr Bld: 7 % — ABNORMAL HIGH (ref 4.6–6.5)

## 2023-09-13 LAB — TSH: TSH: 2.74 u[IU]/mL (ref 0.35–5.50)

## 2023-09-13 NOTE — Assessment & Plan Note (Signed)
 Continue Pantoprazole 40 mg daily

## 2023-09-13 NOTE — Assessment & Plan Note (Signed)
 Ordered CBC to assess.

## 2023-09-13 NOTE — Assessment & Plan Note (Signed)
 Stable. Continue with Levothyroxine  100mcg daily. Ordered TSH.

## 2023-09-13 NOTE — Assessment & Plan Note (Signed)
 Stable. Continue Trelegy Inhaler daily.

## 2023-09-13 NOTE — Assessment & Plan Note (Signed)
 Stable. Continue with Atorvastatin  80mg  daily. Ordered lipid and CMP to assess liver function.

## 2023-09-13 NOTE — Assessment & Plan Note (Signed)
 Slightly elevated, but reports it is usually better at home. Continue to take Amlodipine  10mg  daily, Carvedilol  12.5mg  BID, and Olmesartan  40mg  daily. Ordered CMP to assess kidney function.

## 2023-09-13 NOTE — Progress Notes (Signed)
 New Patient Office Visit  Subjective   Patient ID: Christopher Wade, male    DOB: Feb 02, 1951  Age: 73 y.o. MRN: [id number]  CC:  Chief Complaint  Patient presents with   Establish Care    HPI Christopher Wade presents to establish care with new provider.   Patients previous primary care provider was [facility] with Langley Pippin, NP.   Specialist: [facility] at [facility]-Dr. Will Camnitz  and Dr. Starr Eddy  [facility] at [facility] Dr. Orbie Binder  [facility] with Dr. Loy Ruff  Gout: Chronic. Patient is prescribed Allopurinol  300mg  tablet daily. He reports he has been taking medication for 6 years. He does not recall ever having a gout attack. He reports he was placed on this medication due to lab results.   HTN: Chronic. Patient is prescribed Amlodipine  10mg  daily, Carvedilol  12.5mg  BID, and Olmesartan  40mg  daily.  He reports monitors his blood pressure at home, ranging 120-140/60-70s. Denies CP and HA. Reports SHOB, under the care of pulmonology. Lightheadedness when getting out in the heat. Reports intermittent lower extremity edema.  BP Readings from Last 3 Encounters:  09/13/23 (!) 142/80  08/18/23 (!) 160/72  08/03/23 (!) 165/58    Hyperlipidemia: Chronic. Patient is taking Atorvastatin  80mg  daily. Denies muscle or joint pain, abd pain, nausea, or vomiting. Lab Results  Component Value Date   CHOL 124 03/17/2023   HDL 40 03/17/2023   LDLCALC 56 03/17/2023   TRIG 225 (H) 03/17/2023   CHOLHDL 3.1 03/17/2023    SHOB: Patient is prescribed Trelegy Inhaler, takes 1 inhalation daily. He reports this is helping more than Albuterol  Inhaler as needed.   Lower extremity edema: Chronic. Patient is prescribed Furosemide  40mg  tablet, take 1 tablet 1-2 times per day as needed. He reports he mostly takes 2 tablets a day, one in the morning and evening.   Hypothyroidism: Chronic. Patient is prescribed Levothyroxine   100 mcg daily. Sometimes he has brittle nails. No other symptoms identified for hypothyroidism. Lab Results  Component Value Date   TSH 4.13 03/17/2023    Diabetes: Chronic. Patient prescribed Metformin  XR 500mg  tablet, 2 tablets BID, but patient reports he has been able to cut back to 1 tablet BID. He reports he has not been monitoring his blood sugars at home. He reports he has increased urination, but relates this to drinking a lot of water.  Lab Results  Component Value Date   HGBA1C 7.5 (H) 03/17/2023    GERD: Chronic. Patient prescribed Pantoprazole  40mg  daily. Sometimes it is effective, other times it is not.  Outpatient Encounter Medications as of 09/13/2023  Medication Sig   acetaminophen  (TYLENOL ) 325 MG tablet Take 325 mg by mouth as needed for mild pain.   amLODipine  (NORVASC ) 10 MG tablet Take 1 tab  Daily For BP & Heart   Ascorbic Acid (VITAMIN C WITH ROSE HIPS) 500 MG tablet Take 500 mg by mouth daily.   atorvastatin  (LIPITOR ) 80 MG tablet TAKE 1/2 TABLET BY MOUTH DAILY   B Complex Vitamins (B COMPLEX PO) Take 1 tablet by mouth daily.   carvedilol  (COREG ) 12.5 MG tablet TAKE 1 TABLET BY MOUTH TWICE DAILY WITH A MEAL   cholecalciferol  (VITAMIN D ) 1000 UNITS tablet Take 1,000 Units by mouth daily.   Ferrous Sulfate (IRON ) 325 (65 Fe) MG TABS Take 1 tablet (325 mg total) by mouth every other day.   Fluticasone-Umeclidin-Vilant (TRELEGY ELLIPTA ) 100-62.5-25 MCG/ACT AEPB Inhale 100 mcg into  the lungs daily.   furosemide  (LASIX ) 40 MG tablet TAKE 1 TABLET BY MOUTH 1 TO 2 TIMES PER DAY AS NEEDED FOR FLUID RETENTION OR ANKLE SWELLING   levothyroxine  (SYNTHROID ) 100 MCG tablet Take  1 tablet  Daily  on an empty stomach with only water for 30 minutes & no Antacid meds, Calcium  or Magnesium  for 4 hours & avoid Biotin                                                                                 /                                         TAKE                                                      BY                                                  MOUTH   loratadine  (CLARITIN ) 10 MG tablet Take 10 mg by mouth daily.   metFORMIN  (GLUCOPHAGE -XR) 500 MG 24 hr tablet Take 2 tablets in the morning and 2 tablets in the evening.   olmesartan  (BENICAR ) 40 MG tablet Take 1 tablet (40 mg total) by mouth daily. for blood pressure   Omega-3 Fatty Acids (FISH OIL) 1200 MG CAPS Take 1,200 mg by mouth daily.   pantoprazole  (PROTONIX ) 40 MG tablet TAKE 1 TABLET BY MOUTH EVERY DAY BEFORE BREAKFAST   potassium chloride  SA (KLOR-CON  M) 20 MEQ tablet Take 2 tablets (40 mEq total) by mouth daily for 1 day, THEN 1 tablet (20 mEq total) as needed (Take 2 tablets the first day, then take one tablet anytime you have to take your lasix  for fluid retention or ankle swelling.).   warfarin (COUMADIN ) 5 MG tablet TAKE 1 TABLET BY MOUTH DAILY. EXCEPT 1 AND 1/2 TABLETS ON MONDAYS OR AS DIRECTED BY COAGULATION CLINIC   zinc gluconate 50 MG tablet Take 50 mg by mouth daily.   [DISCONTINUED] albuterol  (VENTOLIN  HFA) 108 (90 Base) MCG/ACT inhaler Inhale 2 puffs into the lungs every 6 (six) hours as needed for wheezing or shortness of breath.   [DISCONTINUED] allopurinol  (ZYLOPRIM ) 300 MG tablet TAKE 1 TABLET BY MOUTH DAILY FOR GOUT PREVENTION   [DISCONTINUED] magnesium  gluconate (MAGONATE) 500 MG tablet Take 500mg  TID and with meals.   No facility-administered encounter medications on file as of 09/13/2023.    Past Medical History:  Diagnosis Date   AKI (acute kidney injury) (HCC) 05/27/2020   Arthritis    "joints get stiff" (06/11/2016)   Bee sting allergy    Bee sting allergy    CAD (coronary artery disease), native coronary artery 02/12/2016   cath showing 50% LM, 40% distal LAD, 70%  ost to prox LCs, 40% mid LAD and 95% mid to dist RCA s/p PCI with DES x 2 and stage PCI of the LCx/OM.   Carotid artery stenosis    1-39% bilateral carotid stenosis by dopples 06/2022   Duodenal ulcer    History of gout    Hx of  adenomatous colonic polyps 04/10/2008   Hyperlipidemia    Hypertension    Hypothyroidism    OSA (obstructive sleep apnea)    "tried CPAP; couldn't do it" (06/11/2016)   Persistent atrial fibrillation (HCC)    s/p DCCV 03/2015 with reversion back to atrial fibrillation, loaded with Amio and s/p DCCV to NSR 11/2015.  He is now s/p afib albation.  He had amio lung toxicity and amio was stopped.   Subclavian artery stenosis (HCC)    right subclavian artery stenosis by dopplers 2/24   Thyroid  disease    Type II diabetes mellitus (HCC)    Vitamin D  deficiency     Past Surgical History:  Procedure Laterality Date   BIOPSY  05/28/2020   Procedure: BIOPSY;  Surgeon: Nannette Babe, MD;  Location: [facility] ENDOSCOPY;  Service: Gastroenterology;;   CARDIAC CATHETERIZATION N/A 10/11/2015   Procedure: Left Heart Cath and Coronary Angiography;  Surgeon: Loyde Rule, MD;  Location: [facility] INVASIVE CV LAB;  Service: Cardiovascular;  Laterality: N/A;   CARDIAC CATHETERIZATION N/A 10/11/2015   Procedure: Coronary Stent Intervention;  Surgeon: Peter M Swaziland, MD;  Location: [facility] INVASIVE CV LAB;  Service: Cardiovascular;  Laterality: N/A;   CARDIAC CATHETERIZATION N/A 10/11/2015   Procedure: Intravascular Pressure Wire/FFR Study;  Surgeon: Peter M Swaziland, MD;  Location: [facility] INVASIVE CV LAB;  Service: Cardiovascular;  Laterality: N/A;   CARDIAC CATHETERIZATION N/A 10/14/2015   Procedure: Coronary Stent Intervention;  Surgeon: Lucendia Rusk, MD;  Location: [facility] INVASIVE CV LAB;  Service: Cardiovascular;  Laterality: N/A;   CARDIOVERSION N/A 04/05/2015   Procedure: CARDIOVERSION;  Surgeon: Jacqueline Matsu, MD;  Location: [facility] ENDOSCOPY;  Service: Cardiovascular;  Laterality: N/A;   CARDIOVERSION N/A 12/09/2015   Procedure: CARDIOVERSION;  Surgeon: Jacqueline Matsu, MD;  Location: [facility] ENDOSCOPY;  Service: Cardiovascular;  Laterality: N/A;   CARDIOVERSION N/A 03/16/2016   Procedure: CARDIOVERSION;  Surgeon: Maudine Sos, MD;   Location: [facility] ENDOSCOPY;  Service: Cardiovascular;  Laterality: N/A;   CATARACT EXTRACTION Right 2019   Dr. Noland Battles   CORONARY ANGIOPLASTY     ELECTROPHYSIOLOGIC STUDY N/A 06/11/2016   Procedure: Atrial Fibrillation Ablation;  Surgeon: Will Cortland Ding, MD;  Location: [facility] INVASIVE CV LAB;  Service: Cardiovascular;  Laterality: N/A;   ESOPHAGOGASTRODUODENOSCOPY (EGD) WITH PROPOFOL  N/A 05/28/2020   Procedure: ESOPHAGOGASTRODUODENOSCOPY (EGD) WITH PROPOFOL ;  Surgeon: Nannette Babe, MD;  Location: [facility] ENDOSCOPY;  Service: Gastroenterology;  Laterality: N/A;   TOTAL HIP ARTHROPLASTY Bilateral 2001   UVULECTOMY      Family History  Problem Relation Age of Onset   Lung cancer Mother    Liver disease Mother    Lung cancer Father    Liver disease Father    Colon cancer Sister    Liver disease Sister    Diabetes Sister    Prostate cancer Brother    Liver disease Brother    Stomach cancer Neg Hx    Pancreatic cancer Neg Hx    Esophageal cancer Neg Hx     Social History   Socioeconomic History   Marital status: Single    Spouse name: Not on file   Number of children: 0  Years of education: Not on file   Highest education level: High school graduate  Occupational History   Not on file  Tobacco Use   Smoking status: Never   Smokeless tobacco: Never  Vaping Use   Vaping status: Never Used  Substance and Sexual Activity   Alcohol use: No   Drug use: No   Sexual activity: Not Currently  Other Topics Concern   Not on file  Social History Narrative   Not on file   Social Drivers of Health   Financial Resource Strain: Low Risk  (09/13/2023)   Overall Financial Resource Strain (CARDIA)    Difficulty of Paying Living Expenses: Not hard at all  Food Insecurity: No Food Insecurity (05/09/2021)   Hunger Vital Sign    Worried About Running Out of Food in the Last Year: Never true    Ran Out of Food in the Last Year: Never true  Transportation Needs: No Transportation Needs (05/09/2021)    PRAPARE - Administrator, Civil Service (Medical): No    Lack of Transportation (Non-Medical): No  Physical Activity: Inactive (09/13/2023)   Exercise Vital Sign    Days of Exercise per Week: 0 days    Minutes of Exercise per Session: 0 min  Stress: No Stress Concern Present (09/13/2023)   [employer] of Occupational Health - Occupational Stress Questionnaire    Feeling of Stress : Not at all  Social Connections: Moderately Isolated (09/13/2023)   Social Connection and Isolation Panel [NHANES]    Frequency of Communication with Friends and Family: Twice a week    Frequency of Social Gatherings with Friends and Family: Once a week    Attends Religious Services: 1 to 4 times per year    Active Member of [employer] or Organizations: No    Attends Banker Meetings: Never    Marital Status: Never married  Intimate Partner Violence: Not At Risk (09/13/2023)   Humiliation, Afraid, Rape, and Kick questionnaire    Fear of Current or Ex-Partner: No    Emotionally Abused: No    Physically Abused: No    Sexually Abused: No    ROS See HPI above    Objective  BP (!) 142/80   Pulse 67   Temp 98.2 F (36.8 C) (Oral)   Ht 5\' 10"  (1.778 m)   Wt 240 lb (108.9 kg)   SpO2 92%   BMI 34.44 kg/m   Physical Exam Vitals reviewed.  Constitutional:      General: He is not in acute distress.    Appearance: Normal appearance. He is obese. He is not ill-appearing, toxic-appearing or diaphoretic.  HENT:     Head: Normocephalic and atraumatic.  Eyes:     General:        Right eye: No discharge.        Left eye: No discharge.     Conjunctiva/sclera: Conjunctivae normal.  Cardiovascular:     Rate and Rhythm: Normal rate and regular rhythm.     Pulses:          Posterior tibial pulses are 1+ on the right side and 1+ on the left side.     Heart sounds: Normal heart sounds. No murmur heard.    No friction rub. No gallop.  Pulmonary:     Effort: Pulmonary effort is normal.  No respiratory distress.     Breath sounds: Normal breath sounds.  Musculoskeletal:        General: Normal range of motion.  Right lower leg: Edema (+1-2) present.     Left lower leg: Edema (+1-2) present.  Feet:     Right foot:     Protective Sensation: 10 sites tested.  8 sites sensed.     Skin integrity: Callus and dry skin present.     Toenail Condition: Right toenails are abnormally thick and long. Fungal disease present.    Left foot:     Protective Sensation: 10 sites tested.  4 sites sensed.     Skin integrity: Callus and dry skin present.     Toenail Condition: Left toenails are abnormally thick and long. Fungal disease present. Skin:    General: Skin is warm and dry.  Neurological:     General: No focal deficit present.     Mental Status: He is alert and oriented to person, place, and time. Mental status is at baseline.     Gait: Gait abnormal (Crutch).  Psychiatric:        Mood and Affect: Mood normal.        Behavior: Behavior normal.        Thought Content: Thought content normal.        Judgment: Judgment normal.      Assessment & Plan:  Essential hypertension Assessment & Plan: Slightly elevated, but reports it is usually better at home. Continue to take Amlodipine  10mg  daily, Carvedilol  12.5mg  BID, and Olmesartan  40mg  daily. Ordered CMP to assess kidney function.   Orders: -     Comprehensive metabolic panel with GFR  Hyperlipidemia associated with type 2 diabetes mellitus (HCC) Assessment & Plan: Stable. Continue with Atorvastatin  80mg  daily. Ordered lipid and CMP to assess liver function.   Orders: -     Comprehensive metabolic panel with GFR -     Lipid panel  Type 2 diabetes mellitus with stage 2 chronic kidney disease, without long-term current use of insulin  (HCC) Assessment & Plan: Continue Metformin  XR 500mg  BID. Ordered A1c and CMP to assess kidney function. Foot exam completed today. Offered to refer to a podiatry, but patient is concerned  about cost. Patient is taking a statin and on an ARB.   Orders: -     Comprehensive metabolic panel with GFR -     Hemoglobin A1c  Hypothyroidism, unspecified type Assessment & Plan: Stable. Continue with Levothyroxine  100mcg daily. Ordered TSH.   Orders: -     TSH  Vitamin D  deficiency Assessment & Plan: Ordered vitamin D  level.   Orders: -     VITAMIN D  25 Hydroxy (Vit-D Deficiency, Fractures)  Chronic gout without tophus, unspecified cause, unspecified site Assessment & Plan: Stable. Discontinue Allopurinol  300mg  daily. Patient questioned if he could come off any of his medications. He reports he has never had a gout flare up and was started on medication 6 years due to blood levels. Do a trail period of coming off medication. Provided written information about gout and advised if he had symptoms of gout to please call back to the office. If this occurs, will prescribe Colchicine  and restart Allopurinol .    Lower extremity edema Assessment & Plan: Uncontrolled. Continue Furosemide  40mg  tablet, take 1-2 tablets per day. Patient is not wearing compression stockings.    Iron  deficiency anemia, unspecified iron  deficiency anemia type Assessment & Plan: Ordered CBC to assess.   Orders: -     CBC with Differential/Platelet  Hx of adenomatous colonic polyps -     Ambulatory referral to Gastroenterology  Barrett's esophagus without dysplasia Assessment & Plan:  Continue Pantoprazole  40mg  daily.    Dyspnea on exertion Assessment & Plan: Stable. Continue Trelegy Inhaler daily.    Encounter to establish care   1.Review health maintenance:  -Colonoscopy: 2019, needed a f/u in 2024; placed a referral back to GI  -AWV: Needs an appointment  -Zoster vaccine: [facility] on [address] then records got transferred to [facility]  Return in about 3 months (around 12/13/2023) for AWV-telehealth with Dr. Burdette Carolin , chronic management.   Cheryn Lundquist, NP

## 2023-09-13 NOTE — Assessment & Plan Note (Signed)
Ordered vitamin-D level.

## 2023-09-13 NOTE — Patient Instructions (Signed)
-  It was nice to meet you and look forward to take care of you.  -Placed a referral to GI to have another colonoscopy completed. It is over due since 2024. Please call the office if you do not receive a phone call in 2 weeks about an appointment.  -Ordered labs. Office will call with lab results.  -Discussed about medications that could be stopped. Only medication that could be stopped is Allopurinol  300mg  tablet. Provided information about gout, if you have any symptoms of gout, please call back to the office. Will restart medication and send in another prescription for gout flare up.  -Continue all other medications. -Follow up in 3 months and schedule an Annual Wellness Exam with Dr. Burdette Carolin.

## 2023-09-13 NOTE — Assessment & Plan Note (Signed)
 Stable. Discontinue Allopurinol  300mg  daily. Patient questioned if he could come off any of his medications. He reports he has never had a gout flare up and was started on medication 6 years due to blood levels. Do a trail period of coming off medication. Provided written information about gout and advised if he had symptoms of gout to please call back to the office. If this occurs, will prescribe Colchicine  and restart Allopurinol .

## 2023-09-13 NOTE — Assessment & Plan Note (Signed)
 Uncontrolled. Continue Furosemide  40mg  tablet, take 1-2 tablets per day. Patient is not wearing compression stockings.

## 2023-09-13 NOTE — Assessment & Plan Note (Addendum)
 Continue Metformin  XR 500mg  BID. Ordered A1c and CMP to assess kidney function. Foot exam completed today. Offered to refer to a podiatry, but patient is concerned about cost. Patient is taking a statin and on an ARB.

## 2023-09-15 ENCOUNTER — Other Ambulatory Visit: Payer: Self-pay | Admitting: Family Medicine

## 2023-09-15 DIAGNOSIS — I10 Essential (primary) hypertension: Secondary | ICD-10-CM

## 2023-09-16 ENCOUNTER — Ambulatory Visit: Payer: Medicare Other | Admitting: Nurse Practitioner

## 2023-09-21 ENCOUNTER — Telehealth: Payer: Self-pay

## 2023-09-21 NOTE — Telephone Encounter (Signed)
**Note De-Identified Pattrick Bady Obfuscation** NPSG PA could not be done through the [employer] provider portal so I completed a [facility] PA form, attached the pts Echo results with Dr Charl Concha recommendations and this pts last office visit notes with Dr Micael Adas and faxed all to [facility] at [phone].  I did receive confirmation that the fax sent successfully.

## 2023-09-24 ENCOUNTER — Telehealth: Payer: Self-pay | Admitting: Internal Medicine

## 2023-09-24 ENCOUNTER — Encounter: Payer: Self-pay | Admitting: Physician Assistant

## 2023-09-24 NOTE — Telephone Encounter (Signed)
 Error

## 2023-09-27 ENCOUNTER — Telehealth: Payer: Self-pay

## 2023-09-27 DIAGNOSIS — M109 Gout, unspecified: Secondary | ICD-10-CM

## 2023-09-27 MED ORDER — COLCHICINE 0.6 MG PO TABS: ORAL_TABLET | ORAL | 3 refills | Status: DC

## 2023-09-27 NOTE — Addendum Note (Signed)
 Addended by: Teodoro Feller B on: 09/27/2023 04:45 PM   Modules accepted: Orders

## 2023-09-27 NOTE — Telephone Encounter (Signed)
 Rx sent to pharmacy and pt is aware. Pt started medication back.

## 2023-09-27 NOTE — Telephone Encounter (Signed)
 Copied from CRM [id number]. Topic: Clinical - Medication Question >> Sep 27, 2023  2:58 PM [place] La N wrote: Reason for CRM: Patient stated dr said to call whenever he has a flare up dealing with gout he is trying to figure out what medicine he can take to see what will help he stated what he has been taking for a while has made it worse.

## 2023-09-28 NOTE — Telephone Encounter (Signed)
**Note De-Identified Oyuki Hogan Obfuscation** Per Renay Carota with [employer] this NPSG PA has been approved from 10/18/23-01/16/2024. Authorization #: [id number]  I have transferred the order to the sleep lab so they can contact the pt to schedule the test.

## 2023-09-29 ENCOUNTER — Ambulatory Visit: Admitting: Pulmonary Disease

## 2023-09-29 ENCOUNTER — Ambulatory Visit (HOSPITAL_BASED_OUTPATIENT_CLINIC_OR_DEPARTMENT_OTHER): Admitting: Pulmonary Disease

## 2023-09-29 ENCOUNTER — Encounter: Payer: Self-pay | Admitting: Pulmonary Disease

## 2023-09-29 VITALS — BP 140/80 | HR 68 | Temp 97.6°F | Ht 70.5 in | Wt 240.6 lb

## 2023-09-29 DIAGNOSIS — R0902 Hypoxemia: Secondary | ICD-10-CM | POA: Diagnosis not present

## 2023-09-29 DIAGNOSIS — J849 Interstitial pulmonary disease, unspecified: Secondary | ICD-10-CM

## 2023-09-29 DIAGNOSIS — R0609 Other forms of dyspnea: Secondary | ICD-10-CM

## 2023-09-29 LAB — PULMONARY FUNCTION TEST
DL/VA % pred: 75 %
DL/VA: 3.01 ml/min/mmHg/L
DLCO unc % pred: 50 %
DLCO unc: 12.83 ml/min/mmHg
FEF 25-75 Post: 1.74 L/s
FEF 25-75 Pre: 1.49 L/s
FEF2575-%Change-Post: 16 %
FEF2575-%Pred-Post: 75 %
FEF2575-%Pred-Pre: 64 %
FEV1-%Change-Post: 16 %
FEV1-%Pred-Post: 72 %
FEV1-%Pred-Pre: 61 %
FEV1-Post: 2.26 L
FEV1-Pre: 1.93 L
FEV1FVC-%Change-Post: 14 %
FEV1FVC-%Pred-Pre: 84 %
FEV6-%Change-Post: 3 %
FEV6-%Pred-Post: 79 %
FEV6-%Pred-Pre: 77 %
FEV6-Post: 3.23 L
FEV6-Pre: 3.13 L
FEV6FVC-%Change-Post: 0 %
FEV6FVC-%Pred-Post: 106 %
FEV6FVC-%Pred-Pre: 105 %
FVC-%Change-Post: 2 %
FVC-%Pred-Post: 74 %
FVC-%Pred-Pre: 73 %
FVC-Post: 3.23 L
FVC-Pre: 3.15 L
Post FEV1/FVC ratio: 70 %
Post FEV6/FVC ratio: 100 %
Pre FEV1/FVC ratio: 61 %
Pre FEV6/FVC Ratio: 99 %
RV % pred: 58 %
RV: 1.47 L
TLC % pred: 64 %
TLC: 4.51 L

## 2023-09-29 LAB — SEDIMENTATION RATE: Sed Rate: 36 mm/h — ABNORMAL HIGH (ref 0–20)

## 2023-09-29 LAB — C-REACTIVE PROTEIN: CRP: <1 mg/dL (ref 0.5–20.0)

## 2023-09-29 NOTE — Progress Notes (Signed)
 Full pft performed today.

## 2023-09-29 NOTE — Progress Notes (Signed)
 @Patient  ID: Joelene Murrain, male    DOB: 1951/02/11, 73 y.o.   MRN: [id number]  Chief Complaint  Patient presents with   Follow-up    Doing well.  Would like to review CT from 06/2023.    Referring provider: Francenia Ingle, NP  HPI:   73 y.o. man whom are seen for evaluation of dyspnea on exertion.  Most recent cardiology note reviewed.  Most recent PCP note reviewed.  Symptoms unchanged in the interim.  CT high-resolution obtained in interim reveals interlobular septal thickening in the lower lobes, atelectasis favored versus early fibrosis, as well as scattered endobronchial nodules in the upper lobes.  Possible HSP.  Seen in the past and most serologies negative.  He denies any active exposures.  He did work in Therapist, sports.  No birds at home.  PFTs performed in interim, today.  Reviewed.  Reveals some obstruction that resolves with bronchodilator response significant in FEV1.  TLC and DLCO are slightly reduced compared to 7 years ago.  HPI at initial visit: Shortness of breath for some time.  Couple years.  Worse with inclines or stairs.  No time of day when things are better or worse.  No position that make things better or worse.  No seasonal or environmental factors he can identify that make things better or worse.  Noted 03/2023 to be hypoxemic with exertion of 74% in PCP office.  No oxygen was ordered.  Referred to pulmonary.  Not clear why oxygen was not ordered.  In the past on the inhalers did not help very much.  He had most recent CT scan high-resolution chest 01/2017 was clear, no signs of ILD on my review and interpretation.  There was some concern for possible early UIP with interlobular septal thickening but there was no honeycombing, frankly I see very mild if any interlobular septal thickening.  In 2022 he had a TTE that showed a dilated left atrium, likely diastolic dysfunction.  Most recent chest imaging with a chest x-ray 11/2022 that shows enlarged  heart, clear lungs, right hemidiaphragm elevation.  Questionaires / Pulmonary Flowsheets:   ACT:      No data to display          MMRC:     No data to display          Epworth:      No data to display          Tests:   FENO:  No results found for: "NITRICOXIDE"  PFT:    Latest Ref Rng & Units 09/29/2023    9:58 AM 11/11/2015    2:48 PM  PFT Results  FVC-Pre L 3.15  P 3.41   FVC-Predicted Pre % 73  P 71   FVC-Post L 3.23  P 3.57   FVC-Predicted Post % 74  P 74   Pre FEV1/FVC % % 61  P 75   Post FEV1/FCV % % 70  P 78   FEV1-Pre L 1.93  P 2.55   FEV1-Predicted Pre % 61  P 71   FEV1-Post L 2.26  P 2.77   DLCO uncorrected ml/min/mmHg 12.83  P 18.89   DLCO UNC% % 50  P 56   DLCO corrected ml/min/mmHg  18.34   DLCO COR %Predicted %  54   DLVA Predicted % 75  P 71   TLC L 4.51  P 5.42   TLC % Predicted % 64  P 75   RV % Predicted % 58  P 80     P Preliminary result  Personally reviewed moderate fixed obstruction resolves with bronchodilator response, significant in FEV1, lung volumes moderately reduced, DLCO moderately reduced, spirometry largely unchanged but reduction in TLC and DLCO in interim since 2017  WALK:     02/02/2017    3:50 PM 07/29/2016   12:47 PM 01/13/2016    2:18 PM  SIX MIN WALK  Supplimental Oxygen during Test? (L/min) No No No    Imaging: Personally reviewed and as per EMR and discussion in this note No results found.  Lab Results: Personally reviewed CBC    Component Value Date/Time   WBC 8.6 09/13/2023 0945   RBC 4.33 09/13/2023 0945   HGB 14.3 09/13/2023 0945   HGB 13.3 06/01/2016 1240   HCT 44.0 09/13/2023 0945   HCT 39.2 06/01/2016 1240   PLT 268.0 09/13/2023 0945   PLT 240 06/01/2016 1240   MCV 101.6 (H) 09/13/2023 0945   MCV 97 06/01/2016 1240   MCH 33.3 (H) 03/17/2023 1505   MCHC 32.5 09/13/2023 0945   RDW 18.0 (H) 09/13/2023 0945   RDW 15.2 06/01/2016 1240   LYMPHSABS 1.8 09/13/2023 0945   LYMPHSABS 0.9  06/01/2016 1240   MONOABS 0.9 09/13/2023 0945   EOSABS 0.2 09/13/2023 0945   EOSABS 0.1 06/01/2016 1240   BASOSABS 0.0 09/13/2023 0945   BASOSABS 0.0 06/01/2016 1240    BMET    Component Value Date/Time   NA 143 09/13/2023 0945   NA 142 06/24/2023 1233   K 4.1 09/13/2023 0945   CL 103 09/13/2023 0945   CO2 30 09/13/2023 0945   GLUCOSE 137 (H) 09/13/2023 0945   BUN 17 09/13/2023 0945   BUN 10 06/24/2023 1233   CREATININE 1.22 09/13/2023 0945   CREATININE 1.56 (H) 03/17/2023 1505   CALCIUM  9.1 09/13/2023 0945   GFRNONAA >60 12/22/2022 1450   GFRNONAA 71 11/06/2020 1506   GFRAA 82 11/06/2020 1506    BNP    Component Value Date/Time   BNP 81.8 06/10/2023 1535   BNP 86.8 05/27/2020 0829    ProBNP    Component Value Date/Time   PROBNP 238 08/14/2021 1122    Specialty Problems       Pulmonary Problems   OSA (obstructive sleep apnea)   noncompliant with CPAP      Dyspnea on exertion   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, 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 2018; non-smoker, Possible UIP with  summer 2017 through Feb 2018    Allergies  Allergen Reactions   Amiodarone  Other (See Comments)    Lung toxicity   Bee Venom Other (See Comments)    Leg went numb    Immunization History  Administered Date(s) Administered   Influenza Split 04/05/2014   Influenza, High Dose Seasonal PF 03/31/2018, 03/20/2019, 05/07/2020, 05/28/2021, 03/17/2023   Influenza-Unspecified 02/02/2013, 05/21/2016   PFIZER Comirnaty(Gray Top)Covid-19 Tri-Sucrose Vaccine 05/30/2021   PFIZER(Purple Top)SARS-COV-2 Vaccination 12/13/2019, 01/03/2020, 07/09/2020   Pfizer(Comirnaty)Fall Seasonal Vaccine 12 years and older 03/17/2023   Pneumococcal Conjugate-13 05/01/2014   Pneumococcal Polysaccharide-23 05/16/2015   Td 04/25/2010, 05/07/2020   Varicella Zoster Immune Globulin 02/22/2014    Past Medical History:  Diagnosis Date   AKI (acute kidney injury) (HCC) 05/27/2020    Arthritis    "joints get stiff" (06/11/2016)   Bee sting allergy    Bee sting allergy    CAD (coronary artery disease), native coronary artery 02/12/2016   cath showing 50% LM, 40% distal  LAD, 70% ost to prox LCs, 40% mid LAD and 95% mid to dist RCA s/p PCI with DES x 2 and stage PCI of the LCx/OM.   Carotid artery stenosis    1-39% bilateral carotid stenosis by dopples 06/2022   Duodenal ulcer    History of gout    Hx of adenomatous colonic polyps 04/10/2008   Hyperlipidemia    Hypertension    Hypothyroidism    OSA (obstructive sleep apnea)    "tried CPAP; couldn't do it" (06/11/2016)   Persistent atrial fibrillation (HCC)    s/p DCCV 03/2015 with reversion back to atrial fibrillation, loaded with Amio and s/p DCCV to NSR 11/2015.  He is now s/p afib albation.  He had amio lung toxicity and amio was stopped.   Subclavian artery stenosis (HCC)    right subclavian artery stenosis by dopplers 2/24   Thyroid  disease    Type II diabetes mellitus (HCC)    Vitamin D  deficiency     Tobacco History: Social History   Tobacco Use  Smoking Status Never  Smokeless Tobacco Never   Counseling given: Not Answered   Continue to not smoke  Outpatient Encounter Medications as of 09/29/2023  Medication Sig   acetaminophen  (TYLENOL ) 325 MG tablet Take 325 mg by mouth as needed for mild pain.   amLODipine  (NORVASC ) 10 MG tablet TAKE 1 TABLET BY MOUTH DAILY FOR BLOOD PRESSURE AND HEART   Ascorbic Acid (VITAMIN C WITH ROSE HIPS) 500 MG tablet Take 500 mg by mouth daily.   atorvastatin  (LIPITOR ) 80 MG tablet TAKE 1/2 TABLET BY MOUTH DAILY   B Complex Vitamins (B COMPLEX PO) Take 1 tablet by mouth daily.   carvedilol  (COREG ) 12.5 MG tablet TAKE 1 TABLET BY MOUTH TWICE DAILY WITH A MEAL   cholecalciferol  (VITAMIN D ) 1000 UNITS tablet Take 1,000 Units by mouth daily.   colchicine  0.6 MG tablet take 2 tablet daily now with start of a flare up, take 1 tablet an hour later   Ferrous Sulfate (IRON ) 325 (65  Fe) MG TABS Take 1 tablet (325 mg total) by mouth every other day.   Fluticasone-Umeclidin-Vilant (TRELEGY ELLIPTA ) 100-62.5-25 MCG/ACT AEPB Inhale 100 mcg into the lungs daily.   furosemide  (LASIX ) 40 MG tablet TAKE 1 TABLET BY MOUTH 1 TO 2 TIMES PER DAY AS NEEDED FOR FLUID RETENTION OR ANKLE SWELLING   levothyroxine  (SYNTHROID ) 100 MCG tablet Take  1 tablet  Daily  on an empty stomach with only water for 30 minutes & no Antacid meds, Calcium  or Magnesium  for 4 hours & avoid Biotin                                                                                 /                                         TAKE  BY                                                  MOUTH   loratadine  (CLARITIN ) 10 MG tablet Take 10 mg by mouth daily.   metFORMIN  (GLUCOPHAGE -XR) 500 MG 24 hr tablet Take 2 tablets in the morning and 2 tablets in the evening. (Patient taking differently: Take 1 tablets in the morning and 2 tablets in the evening.)   olmesartan  (BENICAR ) 40 MG tablet Take 1 tablet (40 mg total) by mouth daily. for blood pressure   Omega-3 Fatty Acids (FISH OIL) 1200 MG CAPS Take 1,200 mg by mouth daily.   pantoprazole  (PROTONIX ) 40 MG tablet TAKE 1 TABLET BY MOUTH EVERY DAY BEFORE BREAKFAST   potassium chloride  SA (KLOR-CON  M) 20 MEQ tablet Take 2 tablets (40 mEq total) by mouth daily for 1 day, THEN 1 tablet (20 mEq total) as needed (Take 2 tablets the first day, then take one tablet anytime you have to take your lasix  for fluid retention or ankle swelling.).   warfarin (COUMADIN ) 5 MG tablet TAKE 1 TABLET BY MOUTH DAILY. EXCEPT 1 AND 1/2 TABLETS ON MONDAYS OR AS DIRECTED BY COAGULATION CLINIC   zinc gluconate 50 MG tablet Take 50 mg by mouth daily.   No facility-administered encounter medications on file as of 09/29/2023.     Review of Systems  Review of Systems  N/a  Physical Exam  BP (!) 140/80 (BP Location: Left Arm, Patient Position: Sitting, Cuff  Size: Normal)   Pulse 68   Temp 97.6 F (36.4 C) (Oral)   Ht 5' 10.5" (1.791 m)   Wt 240 lb 9.6 oz (109.1 kg)   SpO2 93%   BMI 34.03 kg/m   Wt Readings from Last 5 Encounters:  09/29/23 240 lb 9.6 oz (109.1 kg)  09/13/23 240 lb (108.9 kg)  08/18/23 226 lb (102.5 kg)  06/30/23 231 lb (104.8 kg)  06/10/23 230 lb 12.8 oz (104.7 kg)    BMI Readings from Last 5 Encounters:  09/29/23 34.03 kg/m  09/13/23 34.44 kg/m  08/18/23 32.43 kg/m  06/30/23 33.15 kg/m  06/10/23 33.12 kg/m     Physical Exam General: Sitting in chair, no acute distress Eyes: EOMI, no icterus Neck: Supple, no JVP Pulmonary: Diminished throughout on the right but particular in the right base, otherwise clear Cardiovascular: Warm, minimal edema in the extremities Abdomen: Nondistended, sounds present MSK: No synovitis, no joint effusion Neuro: Normal gait, no weakness Psych: Normal mood, full affect   Assessment & Plan:   Dyspnea on exertion: Suspect component of cardiac abnormalities and development of deconditioning.  He has dilated left atrium, likely diastolic dysfunction on TTE 2022.  Most recent chest x-ray is clear, enlarged heart, right hemidiaphragm elevated 11/2022.  Suspect diaphragmatic paralysis which certainly can be contributing.  CT high-resolution in the past clear, repeat 2025 demonstrates endobronchial nodules in the upper lobe.  Hypertensive pneumonitis considered.  See below.  PFTs, spirometry unchanged with reduction in TLC and DLCO possibly reflective of parenchymal changes.  This could be related to elevated right hemidiaphragm, presumed paralysis.  ILD: Mostly endobronchial nodules noted upper lobe, there are some linear or interlobular septal thickening the lower lobes favored to represent atelectasis.  Serologic workup in 2018 negative.  Check CRP, sed rate, hypersensitive pneumonitis panel, not checked in the past.  HSP most likely in the differential.  Possible sequela of  amiodarone  toxicity in the past.  Has been off this for years.  Reported exertional hypoxemia: Note from PCP office 03/2023 documents desaturation to 74% on room air with exertion.  No oxygen was prescribed.  No explanation for this.  Ambulated today and briefly dropped to 86% but quickly recovered before oxygen could be applied.  I suspect this could be related to atelectasis with presumed right hemidiaphragm paralysis.  High-resolution CT scan ordered as above.  Elevated right hemidiaphragm: Likely contributing.   Return in about 8 weeks (around 11/24/2023) for f/u Dr. Marygrace Snellen.   Guerry Leek, MD 09/29/2023

## 2023-09-29 NOTE — Patient Instructions (Signed)
 Full pft performed today.

## 2023-09-29 NOTE — Patient Instructions (Signed)
 It is nice to see you again  Will get blood work today to see if it correlates with the changes on your CT scan  Overall your pulmonary function test are largely stable compared to 7 years ago.  There are some subtle changes that are related to the CT scan findings.  Return to clinic in 6 to 8 weeks with Dr. Marygrace Snellen

## 2023-10-01 ENCOUNTER — Other Ambulatory Visit: Payer: Self-pay | Admitting: Cardiology

## 2023-10-01 DIAGNOSIS — I4819 Other persistent atrial fibrillation: Secondary | ICD-10-CM

## 2023-10-04 LAB — HYPERSENSITIVITY PNEUMONITIS
A. Pullulans Abs: NEGATIVE
A.Fumigatus #1 Abs: NEGATIVE
Micropolyspora faeni, IgG: NEGATIVE
Pigeon Serum Abs: NEGATIVE
Thermoact. Saccharii: NEGATIVE
Thermoactinomyces vulgaris, IgG: NEGATIVE

## 2023-10-05 ENCOUNTER — Ambulatory Visit: Attending: Cardiovascular Disease | Admitting: *Deleted

## 2023-10-05 DIAGNOSIS — Z5181 Encounter for therapeutic drug level monitoring: Secondary | ICD-10-CM

## 2023-10-05 DIAGNOSIS — I4819 Other persistent atrial fibrillation: Secondary | ICD-10-CM | POA: Diagnosis not present

## 2023-10-05 LAB — POCT INR: INR: 3.9 — AB (ref 2.0–3.0)

## 2023-10-05 NOTE — Patient Instructions (Addendum)
 Description   Do not take any warfarin today then START  taking Warfarin 1 tablet daily. Eat greens tonight. Stay consistent with greens each week (2-3 times per week). Recheck INR in 4 weeks. Coumadin  Clinic: [phone] Fax number [phone]

## 2023-10-06 ENCOUNTER — Ambulatory Visit: Admitting: Pulmonary Disease

## 2023-10-06 ENCOUNTER — Encounter (HOSPITAL_BASED_OUTPATIENT_CLINIC_OR_DEPARTMENT_OTHER)

## 2023-10-15 ENCOUNTER — Other Ambulatory Visit: Payer: Self-pay | Admitting: Cardiology

## 2023-10-15 DIAGNOSIS — E876 Hypokalemia: Secondary | ICD-10-CM

## 2023-10-27 ENCOUNTER — Emergency Department (HOSPITAL_BASED_OUTPATIENT_CLINIC_OR_DEPARTMENT_OTHER)

## 2023-10-27 ENCOUNTER — Emergency Department (HOSPITAL_BASED_OUTPATIENT_CLINIC_OR_DEPARTMENT_OTHER)
Admission: EM | Admit: 2023-10-27 | Discharge: 2023-10-27 | Disposition: A | Discharge status: Home / Self Care | Attending: Emergency Medicine | Admitting: Emergency Medicine

## 2023-10-27 ENCOUNTER — Other Ambulatory Visit: Payer: Self-pay

## 2023-10-27 ENCOUNTER — Encounter (HOSPITAL_BASED_OUTPATIENT_CLINIC_OR_DEPARTMENT_OTHER): Payer: Self-pay | Admitting: Emergency Medicine

## 2023-10-27 DIAGNOSIS — I4891 Unspecified atrial fibrillation: Secondary | ICD-10-CM | POA: Insufficient documentation

## 2023-10-27 DIAGNOSIS — X501XXA Overexertion from prolonged static or awkward postures, initial encounter: Secondary | ICD-10-CM | POA: Diagnosis not present

## 2023-10-27 DIAGNOSIS — Z7984 Long term (current) use of oral hypoglycemic drugs: Secondary | ICD-10-CM | POA: Diagnosis not present

## 2023-10-27 DIAGNOSIS — R197 Diarrhea, unspecified: Secondary | ICD-10-CM | POA: Diagnosis not present

## 2023-10-27 DIAGNOSIS — M545 Low back pain, unspecified: Secondary | ICD-10-CM | POA: Diagnosis present

## 2023-10-27 DIAGNOSIS — Z7901 Long term (current) use of anticoagulants: Secondary | ICD-10-CM | POA: Diagnosis not present

## 2023-10-27 DIAGNOSIS — Y92812 Truck as the place of occurrence of the external cause: Secondary | ICD-10-CM | POA: Insufficient documentation

## 2023-10-27 DIAGNOSIS — I2510 Atherosclerotic heart disease of native coronary artery without angina pectoris: Secondary | ICD-10-CM | POA: Diagnosis not present

## 2023-10-27 DIAGNOSIS — R1032 Left lower quadrant pain: Secondary | ICD-10-CM | POA: Insufficient documentation

## 2023-10-27 DIAGNOSIS — R600 Localized edema: Secondary | ICD-10-CM | POA: Insufficient documentation

## 2023-10-27 DIAGNOSIS — E119 Type 2 diabetes mellitus without complications: Secondary | ICD-10-CM | POA: Insufficient documentation

## 2023-10-27 LAB — CBC WITH DIFFERENTIAL/PLATELET
Abs Immature Granulocytes: 0.02 10*3/uL (ref 0.00–0.07)
Basophils Absolute: 0 10*3/uL (ref 0.0–0.1)
Basophils Relative: 0 %
Eosinophils Absolute: 0.1 10*3/uL (ref 0.0–0.5)
Eosinophils Relative: 1 %
HCT: 42.3 % (ref 39.0–52.0)
Hemoglobin: 14.5 g/dL (ref 13.0–17.0)
Immature Granulocytes: 0 %
Lymphocytes Relative: 16 %
Lymphs Abs: 1.4 10*3/uL (ref 0.7–4.0)
MCH: 34 pg (ref 26.0–34.0)
MCHC: 34.3 g/dL (ref 30.0–36.0)
MCV: 99.1 fL (ref 80.0–100.0)
Monocytes Absolute: 0.6 10*3/uL (ref 0.1–1.0)
Monocytes Relative: 7 %
Neutro Abs: 6.3 10*3/uL (ref 1.7–7.7)
Neutrophils Relative %: 76 %
Platelets: 254 10*3/uL (ref 150–400)
RBC: 4.27 MIL/uL (ref 4.22–5.81)
RDW: 16.2 % — ABNORMAL HIGH (ref 11.5–15.5)
WBC: 8.4 10*3/uL (ref 4.0–10.5)
nRBC: 0 % (ref 0.0–0.2)

## 2023-10-27 LAB — COMPREHENSIVE METABOLIC PANEL WITH GFR
ALT: 14 U/L (ref 0–44)
AST: 25 U/L (ref 15–41)
Albumin: 4.1 g/dL (ref 3.5–5.0)
Alkaline Phosphatase: 97 U/L (ref 38–126)
Anion gap: 17 — ABNORMAL HIGH (ref 5–15)
BUN: 24 mg/dL — ABNORMAL HIGH (ref 8–23)
CO2: 24 mmol/L (ref 22–32)
Calcium: 9.4 mg/dL (ref 8.9–10.3)
Chloride: 100 mmol/L (ref 98–111)
Creatinine, Ser: 1.34 mg/dL — ABNORMAL HIGH (ref 0.61–1.24)
GFR, Estimated: 56 mL/min — ABNORMAL LOW (ref >60)
Glucose, Bld: 118 mg/dL — ABNORMAL HIGH (ref 70–99)
Potassium: 3.7 mmol/L (ref 3.5–5.1)
Sodium: 141 mmol/L (ref 135–145)
Total Bilirubin: 0.9 mg/dL (ref 0.0–1.2)
Total Protein: 7.6 g/dL (ref 6.5–8.1)

## 2023-10-27 LAB — PROTIME-INR
INR: 1.7 — ABNORMAL HIGH (ref 0.8–1.2)
Prothrombin Time: 20 s — ABNORMAL HIGH (ref 11.4–15.2)

## 2023-10-27 LAB — URINALYSIS, ROUTINE W REFLEX MICROSCOPIC
Bilirubin Urine: NEGATIVE
Glucose, UA: NEGATIVE mg/dL
Hgb urine dipstick: NEGATIVE
Leukocytes,Ua: NEGATIVE
Nitrite: NEGATIVE
Protein, ur: NEGATIVE mg/dL
Specific Gravity, Urine: 1.011 (ref 1.005–1.030)
pH: 5.5 (ref 5.0–8.0)

## 2023-10-27 MED ORDER — LIDOCAINE 5 % EX PTCH
1.0000 | MEDICATED_PATCH | CUTANEOUS | Status: DC
  Administered 2023-10-27: 1 via TRANSDERMAL
  Filled 2023-10-27: qty 1

## 2023-10-27 NOTE — ED Provider Notes (Signed)
 [place] EMERGENCY DEPARTMENT AT [facility] Provider Note   CSN: [id number] Arrival date & time: 10/27/23  1324     History  Chief Complaint  Patient presents with   Back Pain    Christopher Wade is a 73 y.o. male.  73 year old male presents with complaint of right lower back pain onset 3 weeks ago when he missed a step and fell but caught himself on his truck, twisted his back. Pain in right lower back, radiates toward right scapula. Has not been to PCP. Has not taken anything for his pain. Also reports diarrhea off and on for months, notes 4-8 episodes of loose, non bloody stools daily. Patient is scheduled for routine colonoscopy in the near future. No fevers, no changes in bladder habits.        Home Medications Prior to Admission medications   Medication Sig Start Date End Date Taking? Authorizing Provider  acetaminophen  (TYLENOL ) 325 MG tablet Take 325 mg by mouth as needed for mild pain.    [provider]  amLODipine  (NORVASC ) 10 MG tablet TAKE 1 TABLET BY MOUTH DAILY FOR BLOOD PRESSURE AND HEART 09/16/23   Williamson, Joanna R, NP  Ascorbic Acid (VITAMIN C WITH ROSE HIPS) 500 MG tablet Take 500 mg by mouth daily.    [provider]  atorvastatin  (LIPITOR ) 80 MG tablet TAKE 1/2 TABLET BY MOUTH DAILY 09/16/23   Williamson, Joanna R, NP  B Complex Vitamins (B COMPLEX PO) Take 1 tablet by mouth daily.    [provider]  carvedilol  (COREG ) 12.5 MG tablet TAKE 1 TABLET BY MOUTH TWICE DAILY WITH A MEAL 05/27/23   Wilkinson, Dana E, FNP  cholecalciferol  (VITAMIN D ) 1000 UNITS tablet Take 1,000 Units by mouth daily.    [provider]  colchicine  0.6 MG tablet take 2 tablet daily now with start of a flare up, take 1 tablet an hour later 09/27/23   Williamson, Joanna R, NP  Ferrous Sulfate (IRON ) 325 (65 Fe) MG TABS Take 1 tablet (325 mg total) by mouth every other day. 08/16/21    Bureau, NP  Fluticasone-Umeclidin-Vilant (TRELEGY  ELLIPTA) 100-62.5-25 MCG/ACT AEPB Inhale 100 mcg into the lungs daily. 04/20/23   Cranford, Tonya, NP  furosemide  (LASIX ) 40 MG tablet TAKE 1 TABLET BY MOUTH 1 TO 2 TIMES PER DAY AS NEEDED FOR FLUID RETENTION OR ANKLE SWELLING 04/26/23   Cranford, Tonya, NP  levothyroxine  (SYNTHROID ) 100 MCG tablet Take  1 tablet  Daily  on an empty stomach with only water for 30 minutes & no Antacid meds, Calcium  or Magnesium  for 4 hours & avoid Biotin                                                                                 /                                         TAKE  BY                                                  MOUTH 08/27/22   Cranford, Tonya, NP  loratadine  (CLARITIN ) 10 MG tablet Take 10 mg by mouth daily.    [provider]  metFORMIN  (GLUCOPHAGE -XR) 500 MG 24 hr tablet Take 2 tablets in the morning and 2 tablets in the evening. Patient taking differently: Take 1 tablets in the morning and 2 tablets in the evening. 08/31/22   Cranford, Tonya, NP  olmesartan  (BENICAR ) 40 MG tablet Take 1 tablet (40 mg total) by mouth daily. for blood pressure 07/07/23   Jarold Merlin B, FNP  Omega-3 Fatty Acids (FISH OIL) 1200 MG CAPS Take 1,200 mg by mouth daily.    [provider]  pantoprazole  (PROTONIX ) 40 MG tablet TAKE 1 TABLET BY MOUTH EVERY DAY BEFORE BREAKFAST 02/09/23   Wilkinson, Dana E, FNP  potassium chloride  SA (KLOR-CON  M) 20 MEQ tablet 2TABS BY MOUTH DAILY X1DAY,THEN 1 TAB AS NEED WHEN YOU TAKE LASIX  FOR FLUID RETENTION/ANKLE SWELLING 10/15/23   Jacqueline Matsu, MD  warfarin (COUMADIN ) 5 MG tablet TAKE 1 TABLET BY MOUTH DAILY, EXCEPT 1 AND 1/2 TABLETS ON MONDAYS OR AS DIRECTED BY COAGULATION CLINIC 10/01/23   Jacqueline Matsu, MD  zinc gluconate 50 MG tablet Take 50 mg by mouth daily.    [provider]      Allergies    Amiodarone  and Bee venom    Review of Systems   Review of Systems Negative except as per HPI Physical  Exam Updated Vital Signs BP (!) 152/63   Pulse 68   Temp 98.3 F (36.8 C)   Resp 18   Ht 5' 10.5" (1.791 m)   Wt 109.1 kg   SpO2 91%   BMI 34.02 kg/m  Physical Exam Vitals and nursing note reviewed.  Constitutional:      General: He is not in acute distress.    Appearance: He is well-developed. He is not diaphoretic.  HENT:     Head: Normocephalic and atraumatic.  Cardiovascular:     Pulses: Normal pulses.  Pulmonary:     Effort: Pulmonary effort is normal.  Abdominal:     Palpations: Abdomen is soft.     Tenderness: There is abdominal tenderness in the left lower quadrant. There is no right CVA tenderness or left CVA tenderness. Negative signs include Shakti Fleer's sign.  Musculoskeletal:     Thoracic back: No tenderness or bony tenderness.     Lumbar back: Tenderness present. No bony tenderness. Negative right straight leg raise test and negative left straight leg raise test.       Back:     Right lower leg: Edema present.     Left lower leg: Edema present.  Skin:    General: Skin is warm and dry.  Neurological:     Mental Status: He is alert and oriented to person, place, and time.  Psychiatric:        Behavior: Behavior normal.     ED Results / Procedures / Treatments   Labs (all labs ordered are listed, but only abnormal results are displayed) Labs Reviewed  COMPREHENSIVE METABOLIC PANEL WITH GFR - Abnormal; Notable for the following components:      Result Value   Glucose, Bld 118 (*)  BUN 24 (*)    Creatinine, Ser 1.34 (*)    GFR, Estimated 56 (*)    Anion gap 17 (*)    All other components within normal limits  CBC WITH DIFFERENTIAL/PLATELET - Abnormal; Notable for the following components:   RDW 16.2 (*)    All other components within normal limits  URINALYSIS, ROUTINE W REFLEX MICROSCOPIC - Abnormal; Notable for the following components:   Ketones, ur TRACE (*)    All other components within normal limits  PROTIME-INR - Abnormal; Notable for the  following components:   Prothrombin Time 20.0 (*)    INR 1.7 (*)    All other components within normal limits    EKG None  Radiology CT Renal Stone Study Result Date: 10/27/2023 CLINICAL DATA:  Abdominal pain.  Concern for kidney stone. EXAM: CT ABDOMEN AND PELVIS WITHOUT CONTRAST TECHNIQUE: Multidetector CT imaging of the abdomen and pelvis was performed following the standard protocol without IV contrast. RADIATION DOSE REDUCTION: This exam was performed according to the departmental dose-optimization program which includes automated exposure control, adjustment of the mA and/or kV according to patient size and/or use of iterative reconstruction technique. COMPARISON:  CT abdomen pelvis dated 05/27/2020. FINDINGS: Evaluation of this exam is limited in the absence of intravenous contrast. Lower chest: Left lung base subpleural blebs. There is coronary vascular calcification. No intra-abdominal free air or free fluid. Hepatobiliary: The liver is unremarkable. No biliary dilatation. The gallbladder is unremarkable. Pancreas: Unremarkable. No pancreatic ductal dilatation or surrounding inflammatory changes. Spleen: Normal in size without focal abnormality. Adrenals/Urinary Tract: The adrenal glands unremarkable bilateral renal vascular calcification. There is no hydronephrosis or obstructing stone. The visualized ureters appear unremarkable. There is trabeculated appearance of the bladder wall with several diverticula. There is a 5.5 cm right posterior bladder diverticulum. Several stones in the urinary bladder as well as within the bladder diverticula. Stomach/Bowel: There is a small hiatal hernia. There is no bowel obstruction or active inflammation. Several small scattered sigmoid diverticula. Appendectomy. Vascular/Lymphatic: Advanced aortoiliac atherosclerotic disease. The IVC is unremarkable. No portal venous gas. There is no adenopathy. Reproductive: The prostate is grossly unremarkable no pelvic  mass. Other: None Musculoskeletal: Degenerative changes of the spine. Bilateral total hip arthroplasties. No acute osseous pathology. IMPRESSION: 1. No hydronephrosis or obstructing stone. 2. Bladder stones and bladder diverticula. 3. No bowel obstruction. 4.  Aortic Atherosclerosis (ICD10-I70.0). Electronically Signed   By: Angus Bark M.D.   On: 10/27/2023 15:10   CT L-SPINE NO CHARGE Result Date: 10/27/2023 CLINICAL DATA:  Back trauma.  Pain. EXAM: CT LUMBAR SPINE WITHOUT CONTRAST TECHNIQUE: Multidetector CT imaging of the lumbar spine was performed without intravenous contrast administration. Multiplanar CT image reconstructions were also generated. RADIATION DOSE REDUCTION: This exam was performed according to the departmental dose-optimization program which includes automated exposure control, adjustment of the mA and/or kV according to patient size and/or use of iterative reconstruction technique. COMPARISON:  None Available. FINDINGS: Segmentation: 5 lumbar type vertebrae. Alignment: No acute subluxation. Vertebrae: No acute fracture.  Osteopenia. Paraspinal and other soft tissues: No paraspinal fluid collection or hematoma. Disc levels: No acute findings. Multilevel degenerative changes with anterior spurring/osteophyte. There is disc desiccation with disc space narrowing and vacuum phenomena at L5-S1. Multilevel diffuse disc bulge most prominent at L3-L4, L4-L5, and L5-S1. IMPRESSION: 1. No acute/traumatic lumbar spine pathology. 2. Multilevel degenerative changes. Electronically Signed   By: Angus Bark M.D.   On: 10/27/2023 15:04    Procedures Procedures    Medications  Ordered in ED Medications  lidocaine  (LIDODERM ) 5 % 1 patch (1 patch Transdermal Patch Applied 10/27/23 1421)    ED Course/ Medical Decision Making/ A&P                                 Medical Decision Making Amount and/or Complexity of Data Reviewed Labs: ordered. Radiology: ordered.  Risk Prescription drug  management.   This patient presents to the ED for concern of right back pain, LLQ pain, diarrhea, this involves an extensive number of treatment options, and is a complaint that carries with it a high risk of complications and morbidity.  The differential diagnosis includes colitis, diverticulitis, enteritis, kidney stone, mks pain   Co morbidities / Chronic conditions that complicate the patient evaluation  AKI, OSA, CAD, gout, duodenal ulcer, afib (on coumadin ), DM, thyroid  disease    Additional history obtained:  Additional history obtained from EMR External records from outside source obtained and reviewed including note from patient's pulmonologist dated 09/29/23   Lab Tests:  I Ordered, and personally interpreted labs.  The pertinent results include:  INR subtherapeutic at 1.7.  CBC without significant findings.  CMP without significant findings.  Urinalysis without significant findings    Imaging Studies ordered:  I ordered imaging studies including CT stone study, CT lumbar spine I independently visualized and interpreted imaging which showed no acute process I agree with the radiologist interpretation    Problem List / ED Course / Critical interventions / Medication management  73 year old male presents with primary complaint of pain in his right lower back that radiates up to his right scapula. Mild TTP on exam, straight leg raise negative. Pain is worse with movement and started after a near fall. CT lumbar spine unremarkable, pain improved with lidoderm , suspect msk source and can recheck with PCP.  Found to have LLQ pain on exam, notes he has had diarrhea on going for months, scheduled for colonoscopy next month, non bloody. CT and labs without acute significant findings. INR slightly subtherapeutic, can recheck with PCP/Coumadin  clinic.  I ordered medication including lidoderm    Reevaluation of the patient after these medicines showed that the patient back pain  improved I have reviewed the patients home medicines and have made adjustments as needed   Social Determinants of Health:  Transitioning to new PCP   Test / Admission - Considered:  Stable for discharge         Final Clinical Impression(s) / ED Diagnoses Final diagnoses:  LLQ abdominal pain  Diarrhea, unspecified type  Acute right-sided low back pain without sciatica    Rx / DC Orders ED Discharge Orders     None         Darlis Eisenmenger, PA-C 10/27/23 1728    Tegeler, Marine Sia, MD 10/30/23 [phone]

## 2023-10-27 NOTE — Discharge Instructions (Addendum)
 Apply lidocaine  patch to back for pain. These are available over the counter.  Your INR today isn 1.7, please follow up with your clinic for guidance.   Recheck with your primary care provider, return to the ER for worsening or concerning symptoms.

## 2023-10-27 NOTE — ED Triage Notes (Signed)
 Pt via pov from home with back pain x 3 weeks. He reports that he twisted his back trying to keep from falling and it has hurt since then. He reports the pain is getting progressively worse. Pt alert & oriented, nad noted.

## 2023-10-27 NOTE — ED Notes (Signed)
 patient is aware we do need a urine sample however patient expressed he does not have to go at this moment

## 2023-11-02 ENCOUNTER — Ambulatory Visit: Attending: Cardiology | Admitting: *Deleted

## 2023-11-02 DIAGNOSIS — I4819 Other persistent atrial fibrillation: Secondary | ICD-10-CM | POA: Diagnosis not present

## 2023-11-02 DIAGNOSIS — Z5181 Encounter for therapeutic drug level monitoring: Secondary | ICD-10-CM

## 2023-11-02 LAB — POCT INR: INR: 1.5 — AB (ref 2.0–3.0)

## 2023-11-02 NOTE — Patient Instructions (Signed)
 Description   Today and tomorrow take 1.5 tablets of warfarin then START taking 1 tablet daily except 1.5 tablets Sundays and Wednesday. Stay consistent with greens each week (2-3 times per week). Recheck INR in 1 week. Coumadin  Clinic: [phone] Fax number [phone]

## 2023-11-09 ENCOUNTER — Other Ambulatory Visit: Payer: Self-pay | Admitting: Family Medicine

## 2023-11-09 DIAGNOSIS — I10 Essential (primary) hypertension: Secondary | ICD-10-CM

## 2023-11-09 DIAGNOSIS — E039 Hypothyroidism, unspecified: Secondary | ICD-10-CM

## 2023-11-09 MED ORDER — LEVOTHYROXINE SODIUM 100 MCG PO TABS
ORAL_TABLET | ORAL | 3 refills | Status: AC
Start: 2023-11-09 — End: ?

## 2023-11-09 MED ORDER — CARVEDILOL 12.5 MG PO TABS
12.5000 mg | ORAL_TABLET | Freq: Two times a day (BID) | ORAL | 0 refills | Status: DC
Start: 2023-11-09 — End: 2024-02-08

## 2023-11-09 NOTE — Telephone Encounter (Signed)
 Copied from CRM [id number]. Topic: Clinical - Medication Refill >> Nov 09, 2023  8:26 AM Christopher Wade wrote: Medication:      carvedilol  (COREG ) 12.5 MG tablet [[id number]]  Order D    levothyroxine  (SYNTHROID ) 100 MCG tablet [[id number]]    metFORMIN  (GLUCOPHAGE -XR) 500 MG 24 hr tablet [[id number]    Has the patient contacted their pharmacy? Yes (Agent: If no, request that the patient contact the pharmacy for the refill. If patient does not wish to contact the pharmacy document the reason why and proceed with request.) (Agent: If yes, when and what did the pharmacy advise?)  This is the patient's preferred pharmacy:  [id number] - [place], [place] - [address] AT [facility] [address] [place] [address] Phone: [phone] Fax: [phone]  Is this the correct pharmacy for this prescription? Yes If no, delete pharmacy and type the correct one.   Has the prescription been filled recently? No  Is the patient out of the medication? Yes  Has the patient been seen for an appointment in the last year OR does the patient have an upcoming appointment? Yes  Can we respond through MyChart? No  Agent: Please be advised that Rx refills may take up to 3 business days. We ask that you follow-up with your pharmacy.

## 2023-11-10 ENCOUNTER — Other Ambulatory Visit: Payer: Self-pay

## 2023-11-10 ENCOUNTER — Ambulatory Visit: Attending: Cardiology | Admitting: *Deleted

## 2023-11-10 DIAGNOSIS — I4819 Other persistent atrial fibrillation: Secondary | ICD-10-CM | POA: Diagnosis not present

## 2023-11-10 DIAGNOSIS — Z5181 Encounter for therapeutic drug level monitoring: Secondary | ICD-10-CM

## 2023-11-10 DIAGNOSIS — E1122 Type 2 diabetes mellitus with diabetic chronic kidney disease: Secondary | ICD-10-CM

## 2023-11-10 LAB — POCT INR: INR: 1.9 — AB (ref 2.0–3.0)

## 2023-11-10 MED ORDER — METFORMIN HCL ER 500 MG PO TB24: ORAL_TABLET | ORAL | 1 refills | Status: DC

## 2023-11-10 NOTE — Patient Instructions (Signed)
 Description   Today take 2 tablets of warfarin then START taking 1 tablet daily except 1.5 tablets Sundays,  Wednesday, and Fridays. Stay consistent with greens each week (2-3 times per week). Recheck INR in 2 weeks. Coumadin  Clinic: [phone] Fax number [phone]

## 2023-11-23 NOTE — Progress Notes (Signed)
 Anticoag encounter

## 2023-11-24 ENCOUNTER — Ambulatory Visit: Attending: Cardiology | Admitting: *Deleted

## 2023-11-24 DIAGNOSIS — I4819 Other persistent atrial fibrillation: Secondary | ICD-10-CM | POA: Diagnosis not present

## 2023-11-24 DIAGNOSIS — Z5181 Encounter for therapeutic drug level monitoring: Secondary | ICD-10-CM

## 2023-11-24 LAB — POCT INR: INR: 2.1 (ref 2.0–3.0)

## 2023-11-24 NOTE — Patient Instructions (Addendum)
 Description   Continue taking warfarin 1 tablet daily except 1.5 tablets Sundays, Wednesday, and Fridays. Stay consistent with greens each week (2-3 times per week). Recheck INR in 4 weeks per patient request. Coumadin  Clinic: [phone] Fax number [phone]

## 2023-11-24 NOTE — Progress Notes (Signed)
Please see anticoagulation encounter.

## 2023-11-29 ENCOUNTER — Ambulatory Visit: Admitting: Physician Assistant

## 2023-11-29 ENCOUNTER — Encounter: Payer: Self-pay | Admitting: Physician Assistant

## 2023-11-29 ENCOUNTER — Telehealth: Payer: Self-pay

## 2023-11-29 VITALS — BP 118/60 | HR 63 | Ht 70.5 in | Wt 226.0 lb

## 2023-11-29 DIAGNOSIS — I2510 Atherosclerotic heart disease of native coronary artery without angina pectoris: Secondary | ICD-10-CM

## 2023-11-29 DIAGNOSIS — Z7901 Long term (current) use of anticoagulants: Secondary | ICD-10-CM

## 2023-11-29 DIAGNOSIS — R195 Other fecal abnormalities: Secondary | ICD-10-CM

## 2023-11-29 DIAGNOSIS — I272 Pulmonary hypertension, unspecified: Secondary | ICD-10-CM

## 2023-11-29 DIAGNOSIS — K2270 Barrett's esophagus without dysplasia: Secondary | ICD-10-CM | POA: Diagnosis not present

## 2023-11-29 DIAGNOSIS — Z8719 Personal history of other diseases of the digestive system: Secondary | ICD-10-CM | POA: Diagnosis not present

## 2023-11-29 DIAGNOSIS — J849 Interstitial pulmonary disease, unspecified: Secondary | ICD-10-CM

## 2023-11-29 DIAGNOSIS — Z860101 Personal history of adenomatous and serrated colon polyps: Secondary | ICD-10-CM

## 2023-11-29 DIAGNOSIS — Z860100 Personal history of colon polyps, unspecified: Secondary | ICD-10-CM

## 2023-11-29 NOTE — Progress Notes (Addendum)
 Chief Complaint: History of Barrett's esophagus and sessile serrated polyp  HPI:    Mr. Christopher Wade is a 73 year old male with a past medical history as listed below including CAD status post DES x 2 on Warfarin, type 2 diabetes, OSA, history of duodenal ulcer and multiple others, known to Dr. Avram, who was referred to me by Christopher Philippe SAUNDERS, NP for consideration of EGD and colonoscopy.    10/05/2017 colonoscopy done for positive Cologuard with 1 diminutive polyp in the transverse colon, internal hemorrhoids and otherwise normal.  Pathology showed a sessile serrated polyp and repeat was recommended in 5 years.    05/28/2020 EGD done at [facility] for heme positive stool and a CT suggesting duodenitis with esophageal mucosal changes suggestive of short segment Barrett's esophagus, 2 cm hiatal hernia, gastritis, 3 duodenal ulcers with pigmented material.    07/01/2023 echo with a EF 60% with mildly thickened heart muscle, mildly dilated right ventricle, mild left atrial enlargement and mild pulmonary hypertension with no valvular disease.  At the time recommended pulmonary function testing and follow-up with pulmonology.    08/03/2023 LV perfusion was normal.  No evidence of ischemia.  EF 59%.    10/27/23 ER visit with a CMP showing glucose of 118, creatinine 1.34, CBC normal.  INR 1.7.  CT renal stone study was negative, showed bladder stones and bladder diverticula.  Also multilevel degenerative changes in the back.    Today, patient presents to clinic and tells me that he is doing fairly well, he has noticed an increase in loose stools only if he eats something greasy and then it will send him running to the bathroom.  He describes workup by cardiology and pulmonology which is ongoing.  He also notes that he is due for repeat EGD and colonoscopy at some point.  He does describe some black looking stools but is on an oral iron  supplement.  Continues on Pantoprazole  40 mg daily.  Denies any other GI  complaints or concerns.    He does use a crutch to help ambulate long distances given his back pain.    Denies fever, chills or weight loss.  Past Medical History:  Diagnosis Date   AKI (acute kidney injury) (HCC) 05/27/2020   Arthritis    joints get stiff (06/11/2016)   Bee sting allergy    Bee sting allergy    CAD (coronary artery disease), native coronary artery 02/12/2016   cath showing 50% LM, 40% distal LAD, 70% ost to prox LCs, 40% mid LAD and 95% mid to dist RCA s/p PCI with DES x 2 and stage PCI of the LCx/OM.   Carotid artery stenosis    1-39% bilateral carotid stenosis by dopples 06/2022   Duodenal ulcer    History of gout    Hx of adenomatous colonic polyps 04/10/2008   Hyperlipidemia    Hypertension    Hypothyroidism    OSA (obstructive sleep apnea)    tried CPAP; couldn't do it (06/11/2016)   Persistent atrial fibrillation (HCC)    s/p DCCV 03/2015 with reversion back to atrial fibrillation, loaded with Amio and s/p DCCV to NSR 11/2015.  He is now s/p afib albation.  He had amio lung toxicity and amio was stopped.   Subclavian artery stenosis (HCC)    right subclavian artery stenosis by dopplers 2/24   Thyroid  disease    Type II diabetes mellitus (HCC)    Vitamin D  deficiency     Past Surgical History:  Procedure  Laterality Date   BIOPSY  05/28/2020   Procedure: BIOPSY;  Surgeon: Albertus Gordy HERO, MD;  Location: [facility] ENDOSCOPY;  Service: Gastroenterology;;   CARDIAC CATHETERIZATION N/A 10/11/2015   Procedure: Left Heart Cath and Coronary Angiography;  Surgeon: Maude JAYSON Emmer, MD;  Location: [facility] INVASIVE CV LAB;  Service: Cardiovascular;  Laterality: N/A;   CARDIAC CATHETERIZATION N/A 10/11/2015   Procedure: Coronary Stent Intervention;  Surgeon: Peter M Swaziland, MD;  Location: [facility] INVASIVE CV LAB;  Service: Cardiovascular;  Laterality: N/A;   CARDIAC CATHETERIZATION N/A 10/11/2015   Procedure: Intravascular Pressure Wire/FFR Study;  Surgeon: Peter M Swaziland, MD;  Location: [facility]  INVASIVE CV LAB;  Service: Cardiovascular;  Laterality: N/A;   CARDIAC CATHETERIZATION N/A 10/14/2015   Procedure: Coronary Stent Intervention;  Surgeon: Candyce GORMAN Reek, MD;  Location: [facility] INVASIVE CV LAB;  Service: Cardiovascular;  Laterality: N/A;   CARDIOVERSION N/A 04/05/2015   Procedure: CARDIOVERSION;  Surgeon: Wilbert JONELLE Bihari, MD;  Location: [facility] ENDOSCOPY;  Service: Cardiovascular;  Laterality: N/A;   CARDIOVERSION N/A 12/09/2015   Procedure: CARDIOVERSION;  Surgeon: Wilbert JONELLE Bihari, MD;  Location: [facility] ENDOSCOPY;  Service: Cardiovascular;  Laterality: N/A;   CARDIOVERSION N/A 03/16/2016   Procedure: CARDIOVERSION;  Surgeon: [name] Scarce, MD;  Location: [facility] ENDOSCOPY;  Service: Cardiovascular;  Laterality: N/A;   CATARACT EXTRACTION Right 2019   Dr. Jens   CORONARY ANGIOPLASTY     ELECTROPHYSIOLOGIC STUDY N/A 06/11/2016   Procedure: Atrial Fibrillation Ablation;  Surgeon: Will Gladis Norton, MD;  Location: [facility] INVASIVE CV LAB;  Service: Cardiovascular;  Laterality: N/A;   ESOPHAGOGASTRODUODENOSCOPY (EGD) WITH PROPOFOL  N/A 05/28/2020   Procedure: ESOPHAGOGASTRODUODENOSCOPY (EGD) WITH PROPOFOL ;  Surgeon: Albertus Gordy HERO, MD;  Location: [facility] ENDOSCOPY;  Service: Gastroenterology;  Laterality: N/A;   TOTAL HIP ARTHROPLASTY Bilateral 2001   UVULECTOMY      Current Outpatient Medications  Medication Sig Dispense Refill   acetaminophen  (TYLENOL ) 325 MG tablet Take 325 mg by mouth as needed for mild pain.     amLODipine  (NORVASC ) 10 MG tablet TAKE 1 TABLET BY MOUTH DAILY FOR BLOOD PRESSURE AND HEART 90 tablet 1   Ascorbic Acid (VITAMIN C WITH ROSE HIPS) 500 MG tablet Take 500 mg by mouth daily.     atorvastatin  (LIPITOR ) 80 MG tablet TAKE 1/2 TABLET BY MOUTH DAILY 30 tablet 3   B Complex Vitamins (B COMPLEX PO) Take 1 tablet by mouth daily.     carvedilol  (COREG ) 12.5 MG tablet Take 1 tablet (12.5 mg total) by mouth 2 (two) times daily with a meal. 180 tablet 0   cholecalciferol  (VITAMIN D )  1000 UNITS tablet Take 1,000 Units by mouth daily.     colchicine  0.6 MG tablet take 2 tablet daily now with start of a flare up, take 1 tablet an hour later 3 tablet 3   Ferrous Sulfate (IRON ) 325 (65 Fe) MG TABS Take 1 tablet (325 mg total) by mouth every other day. 30 tablet 0   Fluticasone-Umeclidin-Vilant (TRELEGY ELLIPTA ) 100-62.5-25 MCG/ACT AEPB Inhale 100 mcg into the lungs daily. 60 each 6   furosemide  (LASIX ) 40 MG tablet TAKE 1 TABLET BY MOUTH 1 TO 2 TIMES PER DAY AS NEEDED FOR FLUID RETENTION OR ANKLE SWELLING 180 tablet 1   levothyroxine  (SYNTHROID ) 100 MCG tablet Take  1 tablet  Daily  on an empty stomach with only water for 30 minutes & no Antacid meds, Calcium  or Magnesium  for 4 hours & avoid Biotin                                                                                 /  TAKE                                                     BY                                                  MOUTH 90 tablet 3   loratadine  (CLARITIN ) 10 MG tablet Take 10 mg by mouth daily.     metFORMIN  (GLUCOPHAGE -XR) 500 MG 24 hr tablet Take 1 tablets in the morning and 2 tablets in the evening. 360 tablet 1   olmesartan  (BENICAR ) 40 MG tablet Take 1 tablet (40 mg total) by mouth daily. for blood pressure 90 tablet 0   Omega-3 Fatty Acids (FISH OIL) 1200 MG CAPS Take 1,200 mg by mouth daily.     pantoprazole  (PROTONIX ) 40 MG tablet TAKE 1 TABLET BY MOUTH EVERY DAY BEFORE BREAKFAST 90 tablet 3   potassium chloride  SA (KLOR-CON  M) 20 MEQ tablet 2TABS BY MOUTH DAILY X1DAY,THEN 1 TAB AS NEED WHEN YOU TAKE LASIX  FOR FLUID RETENTION/ANKLE SWELLING 90 tablet 2   warfarin (COUMADIN ) 5 MG tablet TAKE 1 TABLET BY MOUTH DAILY, EXCEPT 1 AND 1/2 TABLETS ON MONDAYS OR AS DIRECTED BY COAGULATION CLINIC 40 tablet 1   zinc gluconate 50 MG tablet Take 50 mg by mouth daily.     No current facility-administered medications for this visit.    Allergies as of 11/29/2023 - Review  Complete 11/29/2023  Allergen Reaction Noted   Amiodarone  Other (See Comments) 07/23/2016   Bee venom Other (See Comments) 04/05/2015    Family History  Problem Relation Age of Onset   Lung cancer Mother    Liver disease Mother    Lung cancer Father    Liver disease Father    Colon cancer Sister    Liver disease Sister    Diabetes Sister    Prostate cancer Brother    Liver disease Brother    Stomach cancer Neg Hx    Pancreatic cancer Neg Hx    Esophageal cancer Neg Hx     Social History   Socioeconomic History   Marital status: Single    Spouse name: Not on file   Number of children: 0   Years of education: Not on file   Highest education level: High school graduate  Occupational History   Occupation: retired  Tobacco Use   Smoking status: Never   Smokeless tobacco: Never  Vaping Use   Vaping status: Never Used  Substance and Sexual Activity   Alcohol use: No   Drug use: No   Sexual activity: Not Currently  Other Topics Concern   Not on file  Social History Narrative   Not on file   Social Drivers of Health   Financial Resource Strain: Low Risk  (09/13/2023)   Overall Financial Resource Strain (CARDIA)    Difficulty of Paying Living Expenses: Not hard at all  Food Insecurity: No Food Insecurity (05/09/2021)   Hunger Vital Sign    Worried About Running Out of Food in the Last Year: Never true    Ran Out of Food in the Last Year: Never true  Transportation Needs: No Transportation Needs (05/09/2021)   PRAPARE -  Administrator, Civil Service (Medical): No    Lack of Transportation (Non-Medical): No  Physical Activity: Inactive (09/13/2023)   Exercise Vital Sign    Days of Exercise per Week: 0 days    Minutes of Exercise per Session: 0 min  Stress: No Stress Concern Present (09/13/2023)   [employer] of Occupational Health - Occupational Stress Questionnaire    Feeling of Stress : Not at all  Social Connections: Moderately Isolated  (09/13/2023)   Social Connection and Isolation Panel    Frequency of Communication with Friends and Family: Twice a week    Frequency of Social Gatherings with Friends and Family: Once a week    Attends Religious Services: 1 to 4 times per year    Active Member of [employer] or Organizations: No    Attends Banker Meetings: Never    Marital Status: Never married  Intimate Partner Violence: Not At Risk (09/13/2023)   Humiliation, Afraid, Rape, and Kick questionnaire    Fear of Current or Ex-Partner: No    Emotionally Abused: No    Physically Abused: No    Sexually Abused: No    Review of Systems:    Constitutional: No weight loss, fever or chills Skin: No rash  Cardiovascular: No chest pain Respiratory: +SOB Gastrointestinal: See HPI and otherwise negative Genitourinary: No dysuria Neurological: No headache, dizziness or syncope Musculoskeletal: No new muscle or joint pain Hematologic: No bleeding  Psychiatric: No history of depression or anxiety   Physical Exam:  Vital signs: BP 118/60   Pulse 63   Ht 5' 10.5 (1.791 m)   Wt 226 lb (102.5 kg)   BMI 31.97 kg/m    Constitutional:   Pleasant Caucasian male appears to be in NAD, Well developed, Well nourished, alert and cooperative Head:  Normocephalic and atraumatic. Eyes:   PEERL, EOMI. No icterus. Conjunctiva pink. Ears:  Normal auditory acuity. Neck:  Supple Throat: Oral cavity and pharynx without inflammation, swelling or lesion.  Respiratory: Respirations even and unlabored. Lungs clear to auscultation bilaterally.   No wheezes, crackles, or rhonchi.  Cardiovascular: Normal S1, S2. No MRG. Regular rate and rhythm. No peripheral edema, cyanosis or pallor.  Gastrointestinal:  Soft, nondistended, nontender. No rebound or guarding. Normal bowel sounds. No appreciable masses or hepatomegaly. Rectal:  Not performed.  Msk:  Symmetrical without gross deformities. Without edema, no deformity or joint abnormality. +back  brace, ambulates with help of crutch-able to ambulate to bed from chair without assistance Neurologic:  Alert and  oriented x4;  grossly normal neurologically.  Skin:   Dry and intact without significant lesions or rashes. Psychiatric: Demonstrates good judgement and reason without abnormal affect or behaviors.  RELEVANT LABS AND IMAGING: CBC    Component Value Date/Time   WBC 8.4 10/27/2023 1405   RBC 4.27 10/27/2023 1405   HGB 14.5 10/27/2023 1405   HGB 13.3 06/01/2016 1240   HCT 42.3 10/27/2023 1405   HCT 39.2 06/01/2016 1240   PLT 254 10/27/2023 1405   PLT 240 06/01/2016 1240   MCV 99.1 10/27/2023 1405   MCV 97 06/01/2016 1240   MCH 34.0 10/27/2023 1405   MCHC 34.3 10/27/2023 1405   RDW 16.2 (H) 10/27/2023 1405   RDW 15.2 06/01/2016 1240   LYMPHSABS 1.4 10/27/2023 1405   LYMPHSABS 0.9 06/01/2016 1240   MONOABS 0.6 10/27/2023 1405   EOSABS 0.1 10/27/2023 1405   EOSABS 0.1 06/01/2016 1240   BASOSABS 0.0 10/27/2023 1405   BASOSABS 0.0 06/01/2016  1240    CMP     Component Value Date/Time   NA 141 10/27/2023 1405   NA 142 06/24/2023 1233   K 3.7 10/27/2023 1405   CL 100 10/27/2023 1405   CO2 24 10/27/2023 1405   GLUCOSE 118 (H) 10/27/2023 1405   BUN 24 (H) 10/27/2023 1405   BUN 10 06/24/2023 1233   CREATININE 1.34 (H) 10/27/2023 1405   CREATININE 1.56 (H) 03/17/2023 1505   CALCIUM  9.4 10/27/2023 1405   PROT 7.6 10/27/2023 1405   ALBUMIN 4.1 10/27/2023 1405   AST 25 10/27/2023 1405   ALT 14 10/27/2023 1405   ALKPHOS 97 10/27/2023 1405   BILITOT 0.9 10/27/2023 1405   GFRNONAA 56 (L) 10/27/2023 1405   GFRNONAA 71 11/06/2020 1506   GFRAA 82 11/06/2020 1506    Assessment: 1.  Barrett's esophagus: Seen at time of last EGD with repeat recommended now for surveillance 2.  Sessile serrated polyps: Last colonoscopy in 2019 with repeat recommended in 5 years 3.  Pulmonary hypertension: New diagnosis by his cardiologist with some shortness of breath recently, currently  being worked up by pulmonary team 4.  CAD status post stenting: On Coumadin   5.  Chronic anticoagulation 6.  Dark stools: Patient complains of some dark stools, he is on oral iron  supplementation and recent CBC was normal; likely iron   Plan: 1.  With current workup ongoing by cardiology and pulmonology will request their clearance before scheduling him for procedures.  Will also need clearance for the patient to hold his Coumadin  for 7 days.  I have also reached out to Norleen Schillings our anesthesiologist to review his chart. 2.  Patient would eventually benefit from repeat EGD given history of Barrett's esophagus for surveillance and colonoscopy given history of polyps and a sessile serrated polyp in 2019 with repeat recommended in 5 years. 3.  Patient told to continue his Pantoprazole  40 mg daily 4.  Discussed dark stools and that iron  can cause this discoloration.  His recent CBC was normal. 5.  Patient to follow in clinic with us  per recommendations after clearance as above.  Delon Failing, PA-C [facility] 11/29/2023, 1:48 PM  Cc: Christopher Philippe SAUNDERS, NP   11/23/2023 3:27 PM  Received notification from Norleen Schillings that patient is a documented difficult intubation and procedures will need to be done at the hospital.  Will still await cardiac and pulmonary clearance to decide the timing for these.  Delon Failing, PA-see

## 2023-11-29 NOTE — Patient Instructions (Signed)
 _______________________________________________________  If your blood pressure at your visit was 140/90 or greater, please contact your primary care physician to follow up on this.  _______________________________________________________  If you are age 74 or older, your body mass index should be between 23-30. Your Body mass index is 31.97 kg/m. If this is out of the aforementioned range listed, please consider follow up with your Primary Care Provider.  If you are age 30 or younger, your body mass index should be between 19-25. Your Body mass index is 31.97 kg/m. If this is out of the aformentioned range listed, please consider follow up with your Primary Care Provider.   ________________________________________________________  The [facility] providers would like to encourage you to use MYCHART to communicate with providers for non-urgent requests or questions.  Due to long hold times on the telephone, sending your provider a message by [facility] may be a faster and more efficient way to get a response.  Please allow 48 business hours for a response.  Please remember that this is for non-urgent requests.  _______________________________________________________

## 2023-11-29 NOTE — Telephone Encounter (Signed)
 [facility] Pre-operative Risk Assessment     Request for surgical clearance:     Endoscopy Procedure  What type of surgery is being performed?     EGD and colonoscopy   When is this surgery scheduled?     TBD  What type of clearance is required ?   Pharmacy AND cardiology   Are there any medications that need to be held prior to surgery and how long? Warfarin 7 days, patient has Afib.   Practice name and name of physician performing surgery?      [facility]  What is your office phone and fax number?      Phone- [phone]  Fax- [phone]  Anesthesia type (None, local, MAC, general) ?       MAC   Please route your response to Corean Amsterdam, [facility]

## 2023-11-30 ENCOUNTER — Encounter: Payer: Self-pay | Admitting: Internal Medicine

## 2023-11-30 DIAGNOSIS — T884XXA Failed or difficult intubation, initial encounter: Secondary | ICD-10-CM | POA: Insufficient documentation

## 2023-12-03 ENCOUNTER — Telehealth: Payer: Self-pay | Admitting: Pulmonary Disease

## 2023-12-03 NOTE — Telephone Encounter (Signed)
 Patient with diagnosis of afib on warfarin for anticoagulation.    Procedure:   EGD and colonoscopy  Date of procedure: TBD   CHA2DS2-VASc Score = 4   This indicates a 4.8% annual risk of stroke. The patient's score is based upon: CHF History: 0 HTN History: 1 Diabetes History: 1 Stroke History: 0 Vascular Disease History: 1 Age Score: 1 Gender Score: 0       Patient has not had an Afib/aflutter ablation within the last 3 months or DCCV within the last 30 days  Per office protocol, patient can hold warfarin for 5 days prior to procedure.    Patient will NOT need bridging with Lovenox (enoxaparin) around procedure.  **This guidance is not considered finalized until pre-operative APP has relayed final recommendations.**

## 2023-12-03 NOTE — Telephone Encounter (Addendum)
 Pt has been cleared from a cardiac and pharmacology stand point for ECL. Who would you like to schedule with? Please advise.

## 2023-12-03 NOTE — Telephone Encounter (Signed)
   Patient Name: Christopher Wade  DOB: 1951/04/10 MRN: [id number]  Primary Cardiologist: [name], MD  Chart reviewed as part of pre-operative protocol coverage. Given past medical history and time since last visit, based on ACC/AHA guidelines, Christopher Wade is at acceptable risk for the planned procedure without further cardiovascular testing.   Recent PET stress test was normal.  See recommendation by our clinical pharmacist, recommend hold warfarin for 5 days prior to the procedure and restart as soon as possible afterward.  Patient does not require Lovenox bridging.  I will route this recommendation to the requesting party via Epic fax function and remove from pre-op pool.  Please call with questions.  Shivam Mestas, [place] 12/03/2023, 3:34 PM

## 2023-12-03 NOTE — Telephone Encounter (Signed)
 Fax received from [facility] perform a EDG/Colonoscopy on patient.  Patient needs surgery clearance. Surgery is pending. Patient was seen on 09/29/23. Office protocol is a risk assessment can be sent to surgeon if patient has been seen in 60 days or less.   Sending to Dr Annella for risk assessment or recommendations if patient needs to be seen in office prior to surgical procedure.

## 2023-12-06 NOTE — Telephone Encounter (Signed)
 Please see below and forwarded to office requesting:  Preoperative risk evaluation: Pulmonary medicine is not provided preoperative clearance or other preoperative risk assessment.  Based on the ARISCAT model, patient has lower 1.6% risk of postoperative pulmonary complication.  Notably, this is a surgical risk assessment.  I am not aware of any endoscopic risk assessments that the been validated.  Given his reported hypoxemia, I recommend this being done in the hospital setting and not an outpatient office.

## 2023-12-07 NOTE — Telephone Encounter (Signed)
 Pt has been cleared from a pulmonology stand point. Will schedule a hospital ECL with Dr Avram as soon as his October schedule opens.   Electronically signed:  Corean Amsterdam, NCMA

## 2023-12-07 NOTE — Telephone Encounter (Signed)
 Pulmonology recommends a hospital based procedure (see TE from 12/03/23). Will schedule with Dr Avram in the hospital once his October schedule opens.   Electronically signed:  Corean Amsterdam, NCMA

## 2023-12-07 NOTE — Telephone Encounter (Signed)
 Copy of this encounter was faxed to [facility] [phone] via [place] base per [facility].

## 2023-12-13 ENCOUNTER — Ambulatory Visit: Payer: Self-pay | Admitting: Family Medicine

## 2023-12-13 ENCOUNTER — Encounter: Payer: Self-pay | Admitting: Family Medicine

## 2023-12-13 ENCOUNTER — Other Ambulatory Visit: Payer: Self-pay

## 2023-12-13 ENCOUNTER — Ambulatory Visit (INDEPENDENT_AMBULATORY_CARE_PROVIDER_SITE_OTHER): Admitting: Family Medicine

## 2023-12-13 VITALS — BP 132/78 | HR 70 | Temp 97.7°F | Ht 70.0 in | Wt 231.0 lb

## 2023-12-13 DIAGNOSIS — I10 Essential (primary) hypertension: Secondary | ICD-10-CM | POA: Diagnosis not present

## 2023-12-13 DIAGNOSIS — E1122 Type 2 diabetes mellitus with diabetic chronic kidney disease: Secondary | ICD-10-CM

## 2023-12-13 DIAGNOSIS — E1169 Type 2 diabetes mellitus with other specified complication: Secondary | ICD-10-CM

## 2023-12-13 DIAGNOSIS — R600 Localized edema: Secondary | ICD-10-CM

## 2023-12-13 DIAGNOSIS — R0609 Other forms of dyspnea: Secondary | ICD-10-CM

## 2023-12-13 DIAGNOSIS — I4819 Other persistent atrial fibrillation: Secondary | ICD-10-CM

## 2023-12-13 DIAGNOSIS — E785 Hyperlipidemia, unspecified: Secondary | ICD-10-CM

## 2023-12-13 DIAGNOSIS — N182 Chronic kidney disease, stage 2 (mild): Secondary | ICD-10-CM | POA: Diagnosis not present

## 2023-12-13 DIAGNOSIS — M109 Gout, unspecified: Secondary | ICD-10-CM

## 2023-12-13 DIAGNOSIS — Z7984 Long term (current) use of oral hypoglycemic drugs: Secondary | ICD-10-CM

## 2023-12-13 DIAGNOSIS — K2270 Barrett's esophagus without dysplasia: Secondary | ICD-10-CM

## 2023-12-13 DIAGNOSIS — E039 Hypothyroidism, unspecified: Secondary | ICD-10-CM

## 2023-12-13 LAB — COMPREHENSIVE METABOLIC PANEL WITH GFR
ALT: 15 U/L (ref 0–53)
AST: 17 U/L (ref 0–37)
Albumin: 4.7 g/dL (ref 3.5–5.2)
Alkaline Phosphatase: 85 U/L (ref 39–117)
BUN: 19 mg/dL (ref 6–23)
CO2: 25 meq/L (ref 19–32)
Calcium: 9.4 mg/dL (ref 8.4–10.5)
Chloride: 100 meq/L (ref 96–112)
Creatinine, Ser: 1.25 mg/dL (ref 0.40–1.50)
GFR: 57.07 mL/min — ABNORMAL LOW (ref >60.00)
Glucose, Bld: 116 mg/dL — ABNORMAL HIGH (ref 70–99)
Potassium: 3.5 meq/L (ref 3.5–5.1)
Sodium: 138 meq/L (ref 135–145)
Total Bilirubin: 1.1 mg/dL (ref 0.2–1.2)
Total Protein: 7.9 g/dL (ref 6.0–8.3)

## 2023-12-13 LAB — MICROALBUMIN / CREATININE URINE RATIO
Creatinine,U: 75.9 mg/dL
Microalb Creat Ratio: 28.3 mg/g (ref 0.0–30.0)
Microalb, Ur: 2.1 mg/dL — ABNORMAL HIGH (ref 0.0–1.9)

## 2023-12-13 LAB — LIPID PANEL
Cholesterol: 124 mg/dL (ref 0–200)
HDL: 37.3 mg/dL — ABNORMAL LOW (ref >39.00)
LDL Cholesterol: 52 mg/dL (ref 0–99)
NonHDL: 86.54
Total CHOL/HDL Ratio: 3
Triglycerides: 175 mg/dL — ABNORMAL HIGH (ref 0.0–149.0)
VLDL: 35 mg/dL (ref 0.0–40.0)

## 2023-12-13 LAB — TSH: TSH: 1.8 u[IU]/mL (ref 0.35–5.50)

## 2023-12-13 LAB — HEMOGLOBIN A1C: Hgb A1c MFr Bld: 7 % — ABNORMAL HIGH (ref 4.6–6.5)

## 2023-12-13 MED ORDER — COLCHICINE 0.6 MG PO TABS: ORAL_TABLET | ORAL | 3 refills | Status: DC

## 2023-12-13 MED ORDER — WARFARIN SODIUM 5 MG PO TABS: ORAL_TABLET | ORAL | 1 refills | Status: DC

## 2023-12-13 NOTE — Assessment & Plan Note (Signed)
 Stable. Continue with Levothyroxine  100mcg daily. Ordered TSH.

## 2023-12-13 NOTE — Assessment & Plan Note (Signed)
 Stable. Continue Trelegy Inhaler daily.

## 2023-12-13 NOTE — Assessment & Plan Note (Signed)
 Uncontrolled. Continue Furosemide  40mg  tablet, take 1-2 tablets per day. Encouraged patient to elevate his legs at heart level and wear compression stockings during the day.

## 2023-12-13 NOTE — Assessment & Plan Note (Signed)
 At last appointment, increased Metformin  XR 500mg , 1 tablet in AM and 2 tablets in PM. Ordered A1c and CMP to assess kidney function. Ordered microalbumin/creatinine urine ratio. Patient is taking a statin and on an ARB.

## 2023-12-13 NOTE — Patient Instructions (Signed)
-  It was good to see you today.  -Continue all medications.  -Ordered labs. Office will call with lab results. -Recommend to elevate legs to heart level when resting and wear compression stockings during the day. Remove at bedtime.  -Follow up in 3 months for chronic management.

## 2023-12-13 NOTE — Assessment & Plan Note (Signed)
 Stable. Continue with Atorvastatin  80mg  daily. Ordered lipid and CMP to assess liver function.

## 2023-12-13 NOTE — Assessment & Plan Note (Signed)
 Stable. Continue to take Amlodipine  10mg  daily, Carvedilol  12.5mg  BID, and Olmesartan  40mg  daily. Ordered CMP to assess kidney function.

## 2023-12-13 NOTE — Progress Notes (Addendum)
 Established Patient Office Visit   Subjective:  Patient ID: Christopher Wade, male    DOB: Dec 03, 1950  Age: 73 y.o. MRN: [id number]  Chief Complaint  Patient presents with   Medical Management of Chronic Issues    3 month follow up     HPI Gout: Chronic. Patient was prescribed Allopurinol  300mg  tablet daily. He reports he has been taking medication for 6 years. At last appointment, discontinued Allopurinol  to see if he could come off a medication. However, he had two gout attacks where he had to Colchicine . He restarted Allopurinol  300mg  daily.    HTN: Chronic. Patient is prescribed Amlodipine  10mg  daily, Carvedilol  12.5mg  BID, and Olmesartan  40mg  daily.  He reports monitors his blood pressure at home, ranging 120-140/60-70s. Denies CP and HA. Reports SHOB, under the care of pulmonology. Lightheadedness when getting out in the heat. Reports intermittent lower extremity edema.  BP Readings from Last 3 Encounters:  12/13/23 132/78  11/29/23 118/60  10/27/23 (!) 141/60    Hyperlipidemia: Chronic. Patient is taking Atorvastatin  80mg  daily. Denies muscle or joint pain, abd pain, nausea, or vomiting. Lab Results  Component Value Date   CHOL 138 09/13/2023   HDL 42.90 09/13/2023   LDLCALC 55 09/13/2023   TRIG 200.0 (H) 09/13/2023   CHOLHDL 3 09/13/2023    SHOB: Patient is prescribed Trelegy Inhaler, takes 1 inhalation daily. He reports this is helping more than Albuterol  Inhaler as needed.    Lower extremity edema: Chronic. Patient is prescribed Furosemide  40mg  tablet, take 1 tablet 1-2 times per day as needed. He reports he mostly takes 2 tablets a day, one in the morning and evening.    Hypothyroidism: Chronic. Patient is prescribed Levothyroxine  100 mcg daily. Sometimes he has brittle nails. No other symptoms identified for hypothyroidism. Lab Results  Component Value Date   TSH 2.74 09/13/2023    Diabetes: Chronic. Patient prescribed Metformin  XR 500mg  tablet, 1 tablet in the  morning and 2 tablets in the evening. He reports he has not been monitoring his blood sugars at home. He reports he has increased urination, but relates this to drinking a lot of water.  Lab Results  Component Value Date   HGBA1C 7.0 (H) 09/13/2023    GERD: Chronic. Patient prescribed Pantoprazole  40mg  daily. Sometimes it is effective, other times it is not.  ROS See HPI above     Objective:   BP 132/78   Pulse 70   Temp 97.7 F (36.5 C) (Oral)   Ht 5' 10 (1.778 m)   Wt 231 lb (104.8 kg)   SpO2 97%   BMI 33.15 kg/m     Physical Exam Vitals reviewed.  Constitutional:      General: He is not in acute distress.    Appearance: Normal appearance. He is obese. He is not ill-appearing, toxic-appearing or diaphoretic.  HENT:     Head: Normocephalic and atraumatic.  Eyes:     General:        Right eye: No discharge.        Left eye: No discharge.     Conjunctiva/sclera: Conjunctivae normal.  Cardiovascular:     Rate and Rhythm: Normal rate and regular rhythm.     Heart sounds: Normal heart sounds. No murmur heard.    No friction rub. No gallop.  Pulmonary:     Effort: Pulmonary effort is normal. No respiratory distress.     Breath sounds: Normal breath sounds.  Musculoskeletal:        General:  Normal range of motion.     Right lower leg: Edema (+2-3 Pitting) present.     Left lower leg: Edema (+2-3 pitting) present.  Skin:    General: Skin is warm and dry.  Neurological:     General: No focal deficit present.     Mental Status: He is alert and oriented to person, place, and time. Mental status is at baseline.     Gait: Gait abnormal (Crutch).  Psychiatric:        Mood and Affect: Mood normal.        Behavior: Behavior normal.        Thought Content: Thought content normal.        Judgment: Judgment normal.      Assessment & Plan:  Essential hypertension Assessment & Plan: Stable. Continue to take Amlodipine  10mg  daily, Carvedilol  12.5mg  BID, and Olmesartan  40mg   daily. Ordered CMP to assess kidney function.   Orders: -     Comprehensive metabolic panel with GFR  Hyperlipidemia associated with type 2 diabetes mellitus (HCC) Assessment & Plan: Stable. Continue with Atorvastatin  80mg  daily. Ordered lipid and CMP to assess liver function.   Orders: -     Comprehensive metabolic panel with GFR -     Lipid panel  Dyspnea on exertion Assessment & Plan: Stable. Continue Trelegy Inhaler daily.      Lower extremity edema Assessment & Plan: Uncontrolled. Continue Furosemide  40mg  tablet, take 1-2 tablets per day. Encouraged patient to elevate his legs at heart level and wear compression stockings during the day.    Hypothyroidism, unspecified type Assessment & Plan: Stable. Continue with Levothyroxine  100mcg daily. Ordered TSH.     Orders: -     TSH  Type 2 diabetes mellitus with stage 2 chronic kidney disease, without long-term current use of insulin  (HCC) Assessment & Plan: At last appointment, increased Metformin  XR 500mg , 1 tablet in AM and 2 tablets in PM. Ordered A1c and CMP to assess kidney function. Ordered microalbumin/creatinine urine ratio. Patient is taking a statin and on an ARB.     Orders: -     Comprehensive metabolic panel with GFR -     Hemoglobin A1c -     Microalbumin / creatinine urine ratio  Gout, unspecified cause, unspecified chronicity, unspecified site Assessment & Plan: Restarted Allopurinol  when he had his first gout attack after coming off of Allopurinol  since last appointment. Refilled Colchicine  for gout flare ups.   Orders: -     Colchicine ; take 2 tablet daily now with start of a flare up, take 1 tablet an hour later  Dispense: 3 tablet; Refill: 3  Barrett's esophagus without dysplasia Assessment & Plan: Continue Pantoprazole  40mg  daily. Awaiting to have EGD/colonoscopy with GI.    1.Review health maintenance:  -Covid booster: UTD; Booster in March-[facility] in [place]  -AWV: Has appt tomorrow 8:10am   -Zoster vaccine: Had 1 injection; [facility] at [employer]  Return in about 3 months (around 03/14/2024) for chronic management.   Christopher Cowger, NP

## 2023-12-13 NOTE — Assessment & Plan Note (Signed)
 Continue Pantoprazole  40mg  daily. Awaiting to have EGD/colonoscopy with GI.

## 2023-12-13 NOTE — Assessment & Plan Note (Signed)
 Restarted Allopurinol  when he had his first gout attack after coming off of Allopurinol  since last appointment. Refilled Colchicine  for gout flare ups.

## 2023-12-14 ENCOUNTER — Ambulatory Visit (INDEPENDENT_AMBULATORY_CARE_PROVIDER_SITE_OTHER): Admitting: *Deleted

## 2023-12-14 VITALS — Ht 70.0 in | Wt 220.0 lb

## 2023-12-14 DIAGNOSIS — Z0000 Encounter for general adult medical examination without abnormal findings: Secondary | ICD-10-CM

## 2023-12-14 NOTE — Patient Instructions (Signed)
 Mr. Scarpelli , Thank you for taking time to come for your Medicare Wellness Visit. I appreciate your ongoing commitment to your health goals. Please review the following plan we discussed and let me know if I can assist you in the future.   Screening recommendations/referrals: Colonoscopy: education provided Recommended yearly ophthalmology/optometry visit for glaucoma screening and checkup Recommended yearly dental visit for hygiene and checkup  Vaccinations: Influenza vaccine: up to date Pneumococcal vaccine: Education provided Tdap vaccine: up to date Shingles vaccine: Education provided   Preventive Care 65 Years and Older, Male Preventive care refers to lifestyle choices and visits with your health care provider that can promote health and wellness. What does preventive care include? A yearly physical exam. This is also called an annual well check. Dental exams once or twice a year. Routine eye exams. Ask your health care provider how often you should have your eyes checked. Personal lifestyle choices, including: Daily care of your teeth and gums. Regular physical activity. Eating a healthy diet. Avoiding tobacco and drug use. Limiting alcohol use. Practicing safe sex. Taking low doses of aspirin  every day. Taking vitamin and mineral supplements as recommended by your health care provider. What happens during an annual well check? The services and screenings done by your health care provider during your annual well check will depend on your age, overall health, lifestyle risk factors, and family history of disease. Counseling  Your health care provider may ask you questions about your: Alcohol use. Tobacco use. Drug use. Emotional well-being. Home and relationship well-being. Sexual activity. Eating habits. History of falls. Memory and ability to understand (cognition). Work and work Astronomer. Screening  You may have the following tests or measurements: Height,  weight, and BMI. Blood pressure. Lipid and cholesterol levels. These may be checked every 5 years, or more frequently if you are over 2 years old. Skin check. Lung cancer screening. You may have this screening every year starting at age 54 if you have a 30-pack-year history of smoking and currently smoke or have quit within the past 15 years. Fecal occult blood test (FOBT) of the stool. You may have this test every year starting at age 19. Flexible sigmoidoscopy or colonoscopy. You may have a sigmoidoscopy every 5 years or a colonoscopy every 10 years starting at age 55. Prostate cancer screening. Recommendations will vary depending on your family history and other risks. Hepatitis C blood test. Hepatitis B blood test. Sexually transmitted disease (STD) testing. Diabetes screening. This is done by checking your blood sugar (glucose) after you have not eaten for a while (fasting). You may have this done every 1-3 years. Abdominal aortic aneurysm (AAA) screening. You may need this if you are a current or former smoker. Osteoporosis. You may be screened starting at age 73 if you are at high risk. Talk with your health care provider about your test results, treatment options, and if necessary, the need for more tests. Vaccines  Your health care provider may recommend certain vaccines, such as: Influenza vaccine. This is recommended every year. Tetanus, diphtheria, and acellular pertussis (Tdap, Td) vaccine. You may need a Td booster every 10 years. Zoster vaccine. You may need this after age 76. Pneumococcal 13-valent conjugate (PCV13) vaccine. One dose is recommended after age 23. Pneumococcal polysaccharide (PPSV23) vaccine. One dose is recommended after age 29. Talk to your health care provider about which screenings and vaccines you need and how often you need them. This information is not intended to replace advice given to you  by your health care provider. Make sure you discuss any  questions you have with your health care provider. Document Released: 06/07/2015 Document Revised: 01/29/2016 Document Reviewed: 03/12/2015 Elsevier Interactive Patient Education  2017 [employer].  Fall Prevention in the Home Falls can cause injuries. They can happen to people of all ages. There are many things you can do to make your home safe and to help prevent falls. What can I do on the outside of my home? Regularly fix the edges of walkways and driveways and fix any cracks. Remove anything that might make you trip as you walk through a door, such as a raised step or threshold. Trim any bushes or trees on the path to your home. Use bright outdoor lighting. Clear any walking paths of anything that might make someone trip, such as rocks or tools. Regularly check to see if handrails are loose or broken. Make sure that both sides of any steps have handrails. Any raised decks and porches should have guardrails on the edges. Have any leaves, snow, or ice cleared regularly. Use sand or salt on walking paths during winter. Clean up any spills in your garage right away. This includes oil or grease spills. What can I do in the bathroom? Use night lights. Install grab bars by the toilet and in the tub and shower. Do not use towel bars as grab bars. Use non-skid mats or decals in the tub or shower. If you need to sit down in the shower, use a plastic, non-slip stool. Keep the floor dry. Clean up any water that spills on the floor as soon as it happens. Remove soap buildup in the tub or shower regularly. Attach bath mats securely with double-sided non-slip rug tape. Do not have throw rugs and other things on the floor that can make you trip. What can I do in the bedroom? Use night lights. Make sure that you have a light by your bed that is easy to reach. Do not use any sheets or blankets that are too big for your bed. They should not hang down onto the floor. Have a firm chair that has side  arms. You can use this for support while you get dressed. Do not have throw rugs and other things on the floor that can make you trip. What can I do in the kitchen? Clean up any spills right away. Avoid walking on wet floors. Keep items that you use a lot in easy-to-reach places. If you need to reach something above you, use a strong step stool that has a grab bar. Keep electrical cords out of the way. Do not use floor polish or wax that makes floors slippery. If you must use wax, use non-skid floor wax. Do not have throw rugs and other things on the floor that can make you trip. What can I do with my stairs? Do not leave any items on the stairs. Make sure that there are handrails on both sides of the stairs and use them. Fix handrails that are broken or loose. Make sure that handrails are as long as the stairways. Check any carpeting to make sure that it is firmly attached to the stairs. Fix any carpet that is loose or worn. Avoid having throw rugs at the top or bottom of the stairs. If you do have throw rugs, attach them to the floor with carpet tape. Make sure that you have a light switch at the top of the stairs and the bottom of the stairs. If  you do not have them, ask someone to add them for you. What else can I do to help prevent falls? Wear shoes that: Do not have high heels. Have rubber bottoms. Are comfortable and fit you well. Are closed at the toe. Do not wear sandals. If you use a stepladder: Make sure that it is fully opened. Do not climb a closed stepladder. Make sure that both sides of the stepladder are locked into place. Ask someone to hold it for you, if possible. Clearly mark and make sure that you can see: Any grab bars or handrails. First and last steps. Where the edge of each step is. Use tools that help you move around (mobility aids) if they are needed. These include: Canes. Walkers. Scooters. Crutches. Turn on the lights when you go into a dark area.  Replace any light bulbs as soon as they burn out. Set up your furniture so you have a clear path. Avoid moving your furniture around. If any of your floors are uneven, fix them. If there are any pets around you, be aware of where they are. Review your medicines with your doctor. Some medicines can make you feel dizzy. This can increase your chance of falling. Ask your doctor what other things that you can do to help prevent falls. This information is not intended to replace advice given to you by your health care provider. Make sure you discuss any questions you have with your health care provider. Document Released: 03/07/2009 Document Revised: 10/17/2015 Document Reviewed: 06/15/2014 Elsevier Interactive Patient Education  2017 [employer].

## 2023-12-14 NOTE — Progress Notes (Signed)
 Subjective:   Christopher Wade is a 73 y.o. male who presents for Medicare Annual/Subsequent preventive examination.  Visit Complete: Virtual I connected with  Christopher Wade on 12/14/23 by a audio enabled telemedicine application and verified that I am speaking with the correct person using two identifiers.  Patient Location: Home  Provider Location: Home Office  I discussed the limitations of evaluation and management by telemedicine. The patient expressed understanding and agreed to proceed.  Vital Signs: Because this visit was a virtual/telehealth visit, some criteria may be missing or patient reported. Any vitals not documented were not able to be obtained and vitals that have been documented are patient reported.  Patient Medicare AWV questionnaire was completed by the patient on 12-13-2023; I have confirmed that all information answered by patient is correct and no changes since this date.  Cardiac Risk Factors include: advanced age (>55men, >49 women);family history of premature cardiovascular disease;hypertension;male gender;diabetes mellitus;obesity (BMI >30kg/m2)     Objective:    Today's Vitals   12/14/23 0817  Weight: 220 lb (99.8 kg)  Height: 5' 10 (1.778 m)   Body mass index is 31.57 kg/m.     12/14/2023    8:15 AM 10/27/2023    1:39 PM 12/22/2022    2:33 PM 11/27/2022    8:13 PM 08/30/2022    8:20 PM 08/14/2021    3:03 PM 08/05/2020    3:54 PM  Advanced Directives  Does Patient Have a Medical Advance Directive? Yes No Yes Yes Yes No No  Type of Forensic scientist of [employer] Power of Attorney Living will    Copy of Healthcare Power of Attorney in Chart? Yes - validated most recent copy scanned in chart (See row information)        Would patient like information on creating a medical advance directive?      Yes (MAU/Ambulatory/Procedural Areas - Information given) No - Patient declined    Current Medications  (verified) Outpatient Encounter Medications as of 12/14/2023  Medication Sig   acetaminophen  (TYLENOL ) 325 MG tablet Take 325 mg by mouth as needed for mild pain.   allopurinol  (ZYLOPRIM ) 300 MG tablet Take 300 mg by mouth daily.   amLODipine  (NORVASC ) 10 MG tablet TAKE 1 TABLET BY MOUTH DAILY FOR BLOOD PRESSURE AND HEART   Ascorbic Acid (VITAMIN C WITH ROSE HIPS) 500 MG tablet Take 500 mg by mouth daily.   atorvastatin  (LIPITOR ) 80 MG tablet TAKE 1/2 TABLET BY MOUTH DAILY   B Complex Vitamins (B COMPLEX PO) Take 1 tablet by mouth daily.   carvedilol  (COREG ) 12.5 MG tablet Take 1 tablet (12.5 mg total) by mouth 2 (two) times daily with a meal.   cholecalciferol  (VITAMIN D ) 1000 UNITS tablet Take 1,000 Units by mouth daily.   colchicine  0.6 MG tablet take 2 tablet daily now with start of a flare up, take 1 tablet an hour later   Ferrous Sulfate (IRON ) 325 (65 Fe) MG TABS Take 1 tablet (325 mg total) by mouth every other day.   Fluticasone-Umeclidin-Vilant (TRELEGY ELLIPTA ) 100-62.5-25 MCG/ACT AEPB Inhale 100 mcg into the lungs daily.   furosemide  (LASIX ) 40 MG tablet TAKE 1 TABLET BY MOUTH 1 TO 2 TIMES PER DAY AS NEEDED FOR FLUID RETENTION OR ANKLE SWELLING   levothyroxine  (SYNTHROID ) 100 MCG tablet Take  1 tablet  Daily  on an empty stomach with only water for 30 minutes & no Antacid meds, Calcium  or Magnesium  for 4  hours & avoid Biotin                                                                                 /                                         TAKE                                                     BY                                                  MOUTH   loratadine  (CLARITIN ) 10 MG tablet Take 10 mg by mouth daily.   metFORMIN  (GLUCOPHAGE -XR) 500 MG 24 hr tablet Take 1 tablets in the morning and 2 tablets in the evening.   olmesartan  (BENICAR ) 40 MG tablet Take 1 tablet (40 mg total) by mouth daily. for blood pressure   Omega-3 Fatty Acids (FISH OIL) 1200 MG CAPS Take 1,200 mg by  mouth daily.   pantoprazole  (PROTONIX ) 40 MG tablet TAKE 1 TABLET BY MOUTH EVERY DAY BEFORE BREAKFAST   potassium chloride  SA (KLOR-CON  M) 20 MEQ tablet 2TABS BY MOUTH DAILY X1DAY,THEN 1 TAB AS NEED WHEN YOU TAKE LASIX  FOR FLUID RETENTION/ANKLE SWELLING   warfarin (COUMADIN ) 5 MG tablet TAKE 1 TABLET BY MOUTH DAILY. EXCEPT 1 AND 1/2 TABLETS ON MONDAYS OR AS DIRECTED BY COAGULATION CLINIC   zinc gluconate 50 MG tablet Take 50 mg by mouth daily.   No facility-administered encounter medications on file as of 12/14/2023.    Allergies (verified) Amiodarone  and Bee venom   History: Past Medical History:  Diagnosis Date   AKI (acute kidney injury) (HCC) 05/27/2020   Arthritis    joints get stiff (06/11/2016)   Bee sting allergy    Bee sting allergy    CAD (coronary artery disease), native coronary artery 02/12/2016   cath showing 50% LM, 40% distal LAD, 70% ost to prox LCs, 40% mid LAD and 95% mid to dist RCA s/p PCI with DES x 2 and stage PCI of the LCx/OM.   Carotid artery stenosis    1-39% bilateral carotid stenosis by dopples 06/2022   Difficult airway 11/30/2023   Duodenal ulcer    History of gout    Hx of adenomatous colonic polyps 04/10/2008   Hyperlipidemia    Hypertension    Hypothyroidism    OSA (obstructive sleep apnea)    tried CPAP; couldn't do it (06/11/2016)   Persistent atrial fibrillation (HCC)    s/p DCCV 03/2015 with reversion back to atrial fibrillation, loaded with Amio and s/p DCCV to NSR 11/2015.  He is now s/p afib albation.  He had amio lung toxicity and amio was stopped.   Subclavian artery stenosis (  HCC)    right subclavian artery stenosis by dopplers 2/24   Thyroid  disease    Type II diabetes mellitus (HCC)    Vitamin D  deficiency    Past Surgical History:  Procedure Laterality Date   BIOPSY  05/28/2020   Procedure: BIOPSY;  Surgeon: Albertus Gordy HERO, MD;  Location: [facility] ENDOSCOPY;  Service: Gastroenterology;;   CARDIAC CATHETERIZATION N/A 10/11/2015    Procedure: Left Heart Cath and Coronary Angiography;  Surgeon: Maude JAYSON Emmer, MD;  Location: [facility] INVASIVE CV LAB;  Service: Cardiovascular;  Laterality: N/A;   CARDIAC CATHETERIZATION N/A 10/11/2015   Procedure: Coronary Stent Intervention;  Surgeon: Peter M Swaziland, MD;  Location: [facility] INVASIVE CV LAB;  Service: Cardiovascular;  Laterality: N/A;   CARDIAC CATHETERIZATION N/A 10/11/2015   Procedure: Intravascular Pressure Wire/FFR Study;  Surgeon: Peter M Swaziland, MD;  Location: [facility] INVASIVE CV LAB;  Service: Cardiovascular;  Laterality: N/A;   CARDIAC CATHETERIZATION N/A 10/14/2015   Procedure: Coronary Stent Intervention;  Surgeon: Candyce GORMAN Reek, MD;  Location: [facility] INVASIVE CV LAB;  Service: Cardiovascular;  Laterality: N/A;   CARDIOVERSION N/A 04/05/2015   Procedure: CARDIOVERSION;  Surgeon: Wilbert JONELLE Bihari, MD;  Location: [facility] ENDOSCOPY;  Service: Cardiovascular;  Laterality: N/A;   CARDIOVERSION N/A 12/09/2015   Procedure: CARDIOVERSION;  Surgeon: Wilbert JONELLE Bihari, MD;  Location: [facility] ENDOSCOPY;  Service: Cardiovascular;  Laterality: N/A;   CARDIOVERSION N/A 03/16/2016   Procedure: CARDIOVERSION;  Surgeon: [name] Scarce, MD;  Location: [facility] ENDOSCOPY;  Service: Cardiovascular;  Laterality: N/A;   CATARACT EXTRACTION Right 2019   Dr. Jens   CORONARY ANGIOPLASTY     ELECTROPHYSIOLOGIC STUDY N/A 06/11/2016   Procedure: Atrial Fibrillation Ablation;  Surgeon: Will Gladis Norton, MD;  Location: [facility] INVASIVE CV LAB;  Service: Cardiovascular;  Laterality: N/A;   ESOPHAGOGASTRODUODENOSCOPY (EGD) WITH PROPOFOL  N/A 05/28/2020   Procedure: ESOPHAGOGASTRODUODENOSCOPY (EGD) WITH PROPOFOL ;  Surgeon: Albertus Gordy HERO, MD;  Location: [facility] ENDOSCOPY;  Service: Gastroenterology;  Laterality: N/A;   TOTAL HIP ARTHROPLASTY Bilateral 2001   UVULECTOMY     Family History  Problem Relation Age of Onset   Lung cancer Mother    Liver disease Mother    Lung cancer Father    Liver disease Father    Colon cancer Sister     Liver disease Sister    Diabetes Sister    Prostate cancer Brother    Liver disease Brother    Stomach cancer Neg Hx    Pancreatic cancer Neg Hx    Esophageal cancer Neg Hx    Social History   Socioeconomic History   Marital status: Single    Spouse name: Not on file   Number of children: 0   Years of education: Not on file   Highest education level: High school graduate  Occupational History   Occupation: retired  Tobacco Use   Smoking status: Never   Smokeless tobacco: Never  Vaping Use   Vaping status: Never Used  Substance and Sexual Activity   Alcohol use: No   Drug use: No   Sexual activity: Not Currently  Other Topics Concern   Not on file  Social History Narrative   Not on file   Social Drivers of Health   Financial Resource Strain: Low Risk  (12/14/2023)   Overall Financial Resource Strain (CARDIA)    Difficulty of Paying Living Expenses: Not hard at all  Food Insecurity: No Food Insecurity (12/14/2023)   Hunger Vital Sign    Worried About Running Out of Food  in the Last Year: Never true    Ran Out of Food in the Last Year: Never true  Transportation Needs: No Transportation Needs (12/14/2023)   PRAPARE - Administrator, Civil Service (Medical): No    Lack of Transportation (Non-Medical): No  Physical Activity: Inactive (12/14/2023)   Exercise Vital Sign    Days of Exercise per Week: 0 days    Minutes of Exercise per Session: 0 min  Stress: No Stress Concern Present (12/14/2023)   [employer] of Occupational Health - Occupational Stress Questionnaire    Feeling of Stress: Not at all  Social Connections: Moderately Isolated (12/14/2023)   Social Connection and Isolation Panel    Frequency of Communication with Friends and Family: Twice a week    Frequency of Social Gatherings with Friends and Family: Once a week    Attends Religious Services: 1 to 4 times per year    Active Member of [employer] or Organizations: No    Attends Museum/gallery exhibitions officer: Never    Marital Status: Never married    Tobacco Counseling Counseling given: Not Answered   Clinical Intake:  Pre-visit preparation completed: Yes  Pain : No/denies pain     Diabetes: Yes CBG done?: No Did pt. bring in CBG monitor from home?: No  How often do you need to have someone help you when you read instructions, pamphlets, or other written materials from your doctor or pharmacy?: 1 - Never  Interpreter Needed?: No  Information entered by :: Mliss Graff LPN   Activities of Daily Living    12/14/2023    8:17 AM 09/13/2023    8:31 AM  In your present state of health, do you have any difficulty performing the following activities:  Hearing? 0 0  Vision? 0 0  Difficulty concentrating or making decisions? 0 0  Walking or climbing stairs? 0 1  Dressing or bathing? 0 0  Doing errands, shopping? 0 0  Preparing Food and eating ? N   Using the Toilet? N   In the past six months, have you accidently leaked urine? N   Do you have problems with loss of bowel control? N   Managing your Medications? N   Managing your Finances? N   Housekeeping or managing your Housekeeping? N     Patient Care Team: Billy Philippe SAUNDERS, NP as PCP - General (Family Medicine) Inocencio Soyla Lunger, MD as PCP - Electrophysiology (Cardiology) [name], MD as PCP - Cardiology (Cardiology) Avram Lupita BRAVO, MD as Consulting Physician (Gastroenterology) [name], MD as Consulting Physician (Cardiology) Quinn Odor, [facility] (Inactive) as Pharmacist (Pharmacist)  Indicate any recent Medical Services you may have received from other than [facility] providers in the past year (date may be approximate).     Assessment:   This is a routine wellness examination for Brigg.  Hearing/Vision screen Hearing Screening - Comments:: No trouble heairng Vision Screening - Comments:: Up to date [facility]   Goals Addressed             This Visit's Progress    Patient  Stated       Be alive until next year       Depression Screen    12/14/2023    8:18 AM 12/13/2023    1:14 PM 09/13/2023    8:30 AM 12/12/2022    9:46 PM 08/30/2022    8:21 PM 08/14/2021    3:05 PM 05/12/2021   10:49 PM  PHQ 2/9 Scores  PHQ - 2 Score 0 0 0 0 0 0 0  PHQ- 9 Score 0 0 0        Fall Risk    12/14/2023    8:13 AM 12/13/2023    1:13 PM 09/13/2023    8:30 AM 06/30/2023    2:09 PM 12/12/2022    9:45 PM  Fall Risk   Falls in the past year? 1 1 1 1  0  Number falls in past yr: 1 1 0 0   Injury with Fall? 1 1 0 0   Risk for fall due to : Impaired balance/gait;Impaired mobility Impaired balance/gait   No Fall Risks  Follow up Falls evaluation completed;Education provided;Falls prevention discussed Falls evaluation completed Falls evaluation completed  Falls prevention discussed;Education provided;Falls evaluation completed    MEDICARE RISK AT HOME: Medicare Risk at Home Any stairs in or around the home?: No If so, are there any without handrails?: No Home free of loose throw rugs in walkways, pet beds, electrical cords, etc?: Yes Adequate lighting in your home to reduce risk of falls?: Yes Life alert?: No Use of a cane, walker or w/c?: Yes Grab bars in the bathroom?: Yes Shower chair or bench in shower?: No Elevated toilet seat or a handicapped toilet?: No  TIMED UP AND GO:  Was the test performed?  No    Cognitive Function:    08/05/2020    3:56 PM  MMSE - Mini Mental State Exam  Orientation to time 5  Orientation to Place 5  Registration 3  Attention/ Calculation 5  Recall 3  Language- name 2 objects 2  Language- repeat 1  Language- follow 3 step command 3  Language- read & follow direction 1  Write a sentence 1  Copy design 1  Total score 30        12/14/2023    8:14 AM 08/01/2019    4:06 PM  6CIT Screen  What Year? 0 points 0 points  What month? 0 points 0 points  What time? 0 points 0 points  Count back from 20 2 points 0 points  Months in  reverse 0 points 0 points  Repeat phrase 0 points 0 points  Total Score 2 points 0 points    Immunizations Immunization History  Administered Date(s) Administered   Influenza Split 04/05/2014   Influenza, High Dose Seasonal PF 03/31/2018, 03/20/2019, 05/07/2020, 05/28/2021, 03/17/2023   Influenza-Unspecified 02/02/2013, 05/21/2016   PFIZER Comirnaty(Gray Top)Covid-19 Tri-Sucrose Vaccine 05/30/2021   PFIZER(Purple Top)SARS-COV-2 Vaccination 12/13/2019, 01/03/2020, 07/09/2020   Pfizer(Comirnaty)Fall Seasonal Vaccine 12 years and older 03/17/2023   Pneumococcal Conjugate-13 05/01/2014   Pneumococcal Polysaccharide-23 05/16/2015   Td 04/25/2010, 05/07/2020   Varicella Zoster Immune Globulin 02/22/2014    TDAP status: Up to date  Flu Vaccine status: Up to date  Pneumococcal vaccine status: Up to date  Covid-19 vaccine status: Information provided on how to obtain vaccines.   Qualifies for Shingles Vaccine? Yes   Zostavax completed No   Shingrix Completed?: No.    Education has been provided regarding the importance of this vaccine. Patient has been advised to call insurance company to determine out of pocket expense if they have not yet received this vaccine. Advised may also receive vaccine at local pharmacy or Health Dept. Verbalized acceptance and understanding.  Screening Tests Health Maintenance  Topic Date Due   Zoster Vaccines- Shingrix (1 of 2) Never done   Colonoscopy  10/06/2022   COVID-19 Vaccine (6 - Pfizer risk 2024-25 season) 09/15/2023  INFLUENZA VACCINE  12/24/2023   OPHTHALMOLOGY EXAM  03/10/2024   HEMOGLOBIN A1C  06/14/2024   FOOT EXAM  09/12/2024   Diabetic kidney evaluation - eGFR measurement  12/12/2024   Diabetic kidney evaluation - Urine ACR  12/12/2024   Medicare Annual Wellness (AWV)  12/13/2024   DTaP/Tdap/Td (3 - Tdap) 05/07/2030   Pneumococcal Vaccine: 50+ Years  Completed   Hepatitis C Screening  Completed   Hepatitis B Vaccines  Aged Out    HPV VACCINES  Aged Out   Meningococcal B Vaccine  Aged Out    Health Maintenance  Health Maintenance Due  Topic Date Due   Zoster Vaccines- Shingrix (1 of 2) Never done   Colonoscopy  10/06/2022   COVID-19 Vaccine (6 - Pfizer risk 2024-25 season) 09/15/2023    Colorectal cancer screening: Type of screening: Colonoscopy. Completed 2019. Repeat every in process of getting scheduled years  Lung Cancer Screening: (Low Dose CT Chest recommended if Age 23-80 years, 20 pack-year currently smoking OR have quit w/in 15years.) does not qualify.   Lung Cancer Screening Referral:   Additional Screening:  Hepatitis C Screening: does not qualify; Completed 2022  Vision Screening: Recommended annual ophthalmology exams for early detection of glaucoma and other disorders of the eye. Is the patient up to date with their annual eye exam?  Yes  Who is the provider or what is the name of the office in which the patient attends annual eye exams? [facility] If pt is not established with a provider, would they like to be referred to a provider to establish care? No .   Dental Screening: Recommended annual dental exams for proper oral hygiene  Nutrition Risk Assessment:  Has the patient had any N/V/D within the last 2 months?  No  Does the patient have any non-healing wounds?  No  Has the patient had any unintentional weight loss or weight gain?  No   Diabetes:  Is the patient diabetic?  Yes  If diabetic, was a CBG obtained today?  No  Did the patient bring in their glucometer from home?  No  How often do you monitor your CBG's? Checks at office visit.   Financial Strains and Diabetes Management:  Are you having any financial strains with the device, your supplies or your medication? No .  Does the patient want to be seen by Chronic Care Management for management of their diabetes?  No  Would the patient like to be referred to a Nutritionist or for Diabetic Management?  No   Diabetic  Exams:  Diabetic Eye Exam: Pt has been advised about the importance in completing this exam  Diabetic Foot Exam: . Pt has been advised about the importance in completing this exam. .    Community Resource Referral / Chronic Care Management: CRR required this visit?  No   CCM required this visit?  No     Plan:     I have personally reviewed and noted the following in the patient's chart:   Medical and social history Use of alcohol, tobacco or illicit drugs  Current medications and supplements including opioid prescriptions. Patient is not currently taking opioid prescriptions. Functional ability and status Nutritional status Physical activity Advanced directives List of other physicians Hospitalizations, surgeries, and ER visits in previous 12 months Vitals Screenings to include cognitive, depression, and falls Referrals and appointments  In addition, I have reviewed and discussed with patient certain preventive protocols, quality metrics, and best practice recommendations. A written personalized care plan  for preventive services as well as general preventive health recommendations were provided to patient.     Mliss Graff, LPN   [id number]   After Visit Summary: (MyChart) Due to this being a telephonic visit, the after visit summary with patients personalized plan was offered to patient via MyChart   Nurse Notes:

## 2023-12-15 ENCOUNTER — Encounter: Payer: Self-pay | Admitting: Pulmonary Disease

## 2023-12-15 ENCOUNTER — Ambulatory Visit: Admitting: Pulmonary Disease

## 2023-12-15 VITALS — BP 128/68 | HR 63 | Ht 70.0 in | Wt 232.0 lb

## 2023-12-15 DIAGNOSIS — R0609 Other forms of dyspnea: Secondary | ICD-10-CM

## 2023-12-15 DIAGNOSIS — J849 Interstitial pulmonary disease, unspecified: Secondary | ICD-10-CM

## 2023-12-15 NOTE — Progress Notes (Signed)
 @Patient  ID: Christopher Wade, male    DOB: [birth year], 73 y.o.   MRN: [id number]  Chief Complaint  Patient presents with   Medical Management of Chronic Issues    Referring provider: [name], NP  HPI:   73 y.o. man whom are seen for evaluation of dyspnea on exertion.  Most recent cardiology note reviewed.  Most recent PCP note reviewed.  Returns for routine follow-up.  Unchanged.  In terms of breathing.  Has had a little bit of back pain.,  Twisted his back did not fall but missed a step.  He has upcoming colonoscopy.  Has a lot of issues with diarrhea.  Is primarily on his mind.  HPI at initial visit: Shortness of breath for some time.  Couple years.  Worse with inclines or stairs.  No time of day when things are better or worse.  No position that make things better or worse.  No seasonal or environmental factors he can identify that make things better or worse.  Noted 03/2023 to be hypoxemic with exertion of 74% in PCP office.  No oxygen was ordered.  Referred to pulmonary.  Not clear why oxygen was not ordered.  In the past on the inhalers did not help very much.  He had most recent CT scan high-resolution chest 01/2017 was clear, no signs of ILD on my review and interpretation.  There was some concern for possible early UIP with interlobular septal thickening but there was no honeycombing, frankly I see very mild if any interlobular septal thickening.  In 2022 he had a TTE that showed a dilated left atrium, likely diastolic dysfunction.  Most recent chest imaging with a chest x-ray 11/2022 that shows enlarged heart, clear lungs, right hemidiaphragm elevation.  Questionaires / Pulmonary Flowsheets:   ACT:      No data to display          MMRC:     No data to display          Epworth:      No data to display          Tests:   FENO:  No results found for: NITRICOXIDE  PFT:    Latest Ref Rng & Units 09/29/2023    9:58 AM 11/11/2015    2:48 PM  PFT  Results  FVC-Pre L 3.15  3.41   FVC-Predicted Pre % 73  71   FVC-Post L 3.23  3.57   FVC-Predicted Post % 74  74   Pre FEV1/FVC % % 61  75   Post FEV1/FCV % % 70  78   FEV1-Pre L 1.93  2.55   FEV1-Predicted Pre % 61  71   FEV1-Post L 2.26  2.77   DLCO uncorrected ml/min/mmHg 12.83  18.89   DLCO UNC% % 50  56   DLCO corrected ml/min/mmHg  18.34   DLCO COR %Predicted %  54   DLVA Predicted % 75  71   TLC L 4.51  5.42   TLC % Predicted % 64  75   RV % Predicted % 58  80   Personally reviewed moderate fixed obstruction resolves with bronchodilator response, significant in FEV1, lung volumes moderately reduced, DLCO moderately reduced, spirometry largely unchanged but reduction in TLC and DLCO in interim since 2017  WALK:     02/02/2017    3:50 PM 07/29/2016   12:47 PM 01/13/2016    2:18 PM  SIX MIN WALK  Supplimental Oxygen during Test? (L/min)  No No No    Imaging: Personally reviewed and as per EMR and discussion in this note No results found.  Lab Results: Personally reviewed CBC    Component Value Date/Time   WBC 8.4 10/27/2023 1405   RBC 4.27 10/27/2023 1405   HGB 14.5 10/27/2023 1405   HGB 13.3 06/01/2016 1240   HCT 42.3 10/27/2023 1405   HCT 39.2 06/01/2016 1240   PLT 254 10/27/2023 1405   PLT 240 06/01/2016 1240   MCV 99.1 10/27/2023 1405   MCV 97 06/01/2016 1240   MCH 34.0 10/27/2023 1405   MCHC 34.3 10/27/2023 1405   RDW 16.2 (H) 10/27/2023 1405   RDW 15.2 06/01/2016 1240   LYMPHSABS 1.4 10/27/2023 1405   LYMPHSABS 0.9 06/01/2016 1240   MONOABS 0.6 10/27/2023 1405   EOSABS 0.1 10/27/2023 1405   EOSABS 0.1 06/01/2016 1240   BASOSABS 0.0 10/27/2023 1405   BASOSABS 0.0 06/01/2016 1240    BMET    Component Value Date/Time   NA 138 12/13/2023 1348   NA 142 06/24/2023 1233   K 3.5 12/13/2023 1348   CL 100 12/13/2023 1348   CO2 25 12/13/2023 1348   GLUCOSE 116 (H) 12/13/2023 1348   BUN 19 12/13/2023 1348   BUN 10 06/24/2023 1233   CREATININE 1.25  12/13/2023 1348   CREATININE 1.56 (H) 03/17/2023 1505   CALCIUM  9.4 12/13/2023 1348   GFRNONAA 56 (L) 10/27/2023 1405   GFRNONAA 71 11/06/2020 1506   GFRAA 82 11/06/2020 1506    BNP    Component Value Date/Time   BNP 81.8 06/10/2023 1535   BNP 86.8 05/27/2020 0829    ProBNP    Component Value Date/Time   PROBNP 238 08/14/2021 1122    Specialty Problems       Pulmonary Problems   OSA (obstructive sleep apnea)   noncompliant with CPAP      Dyspnea on exertion   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, 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 2018; non-smoker, Possible UIP with  summer 2017 through Feb 2018   Difficult airway    Allergies  Allergen Reactions   Amiodarone  Other (See Comments)    Lung toxicity   Bee Venom Other (See Comments)    Leg went numb    Immunization History  Administered Date(s) Administered   Influenza Split 04/05/2014   Influenza, High Dose Seasonal PF 03/31/2018, 03/20/2019, 05/07/2020, 05/28/2021, 03/17/2023   Influenza-Unspecified 02/02/2013, 05/21/2016   PFIZER Comirnaty(Gray Top)Covid-19 Tri-Sucrose Vaccine 05/30/2021   PFIZER(Purple Top)SARS-COV-2 Vaccination 12/13/2019, 01/03/2020, 07/09/2020   Pfizer(Comirnaty)Fall Seasonal Vaccine 12 years and older 03/17/2023   Pneumococcal Conjugate-13 05/01/2014   Pneumococcal Polysaccharide-23 05/16/2015   Td 04/25/2010, 05/07/2020   Varicella Zoster Immune Globulin 02/22/2014    Past Medical History:  Diagnosis Date   AKI (acute kidney injury) (HCC) 05/27/2020   Arthritis    joints get stiff (06/11/2016)   Bee sting allergy    Bee sting allergy    CAD (coronary artery disease), native coronary artery 02/12/2016   cath showing 50% LM, 40% distal LAD, 70% ost to prox LCs, 40% mid LAD and 95% mid to dist RCA s/p PCI with DES x 2 and stage PCI of the LCx/OM.   Carotid artery stenosis    1-39% bilateral carotid stenosis by dopples 06/2022   Difficult airway 11/30/2023    Duodenal ulcer    History of gout    Hx of adenomatous colonic polyps 04/10/2008   Hyperlipidemia  Hypertension    Hypothyroidism    OSA (obstructive sleep apnea)    tried CPAP; couldn't do it (06/11/2016)   Persistent atrial fibrillation (HCC)    s/p DCCV 03/2015 with reversion back to atrial fibrillation, loaded with Amio and s/p DCCV to NSR 11/2015.  He is now s/p afib albation.  He had amio lung toxicity and amio was stopped.   Subclavian artery stenosis (HCC)    right subclavian artery stenosis by dopplers 2/24   Thyroid  disease    Type II diabetes mellitus (HCC)    Vitamin D  deficiency     Tobacco History: Social History   Tobacco Use  Smoking Status Never  Smokeless Tobacco Never   Counseling given: Not Answered   Continue to not smoke  Outpatient Encounter Medications as of 12/15/2023  Medication Sig   nystatin  cream (MYCOSTATIN ) Apply 1 Application topically 2 (two) times daily.   acetaminophen  (TYLENOL ) 325 MG tablet Take 325 mg by mouth as needed for mild pain.   allopurinol  (ZYLOPRIM ) 300 MG tablet Take 300 mg by mouth daily.   amLODipine  (NORVASC ) 10 MG tablet TAKE 1 TABLET BY MOUTH DAILY FOR BLOOD PRESSURE AND HEART   Ascorbic Acid (VITAMIN C WITH ROSE HIPS) 500 MG tablet Take 500 mg by mouth daily.   atorvastatin  (LIPITOR ) 80 MG tablet TAKE 1/2 TABLET BY MOUTH DAILY   B Complex Vitamins (B COMPLEX PO) Take 1 tablet by mouth daily.   carvedilol  (COREG ) 12.5 MG tablet Take 1 tablet (12.5 mg total) by mouth 2 (two) times daily with a meal.   cholecalciferol  (VITAMIN D ) 1000 UNITS tablet Take 1,000 Units by mouth daily.   colchicine  0.6 MG tablet take 2 tablet daily now with start of a flare up, take 1 tablet an hour later   Ferrous Sulfate (IRON ) 325 (65 Fe) MG TABS Take 1 tablet (325 mg total) by mouth every other day.   Fluticasone-Umeclidin-Vilant (TRELEGY ELLIPTA ) 100-62.5-25 MCG/ACT AEPB Inhale 100 mcg into the lungs daily.   furosemide  (LASIX ) 40 MG  tablet TAKE 1 TABLET BY MOUTH 1 TO 2 TIMES PER DAY AS NEEDED FOR FLUID RETENTION OR ANKLE SWELLING   levothyroxine  (SYNTHROID ) 100 MCG tablet Take  1 tablet  Daily  on an empty stomach with only water for 30 minutes & no Antacid meds, Calcium  or Magnesium  for 4 hours & avoid Biotin                                                                                 /                                         TAKE                                                     BY  MOUTH   loratadine  (CLARITIN ) 10 MG tablet Take 10 mg by mouth daily.   metFORMIN  (GLUCOPHAGE -XR) 500 MG 24 hr tablet Take 1 tablets in the morning and 2 tablets in the evening.   olmesartan  (BENICAR ) 40 MG tablet Take 1 tablet (40 mg total) by mouth daily. for blood pressure   Omega-3 Fatty Acids (FISH OIL) 1200 MG CAPS Take 1,200 mg by mouth daily.   pantoprazole  (PROTONIX ) 40 MG tablet TAKE 1 TABLET BY MOUTH EVERY DAY BEFORE BREAKFAST   potassium chloride  SA (KLOR-CON  M) 20 MEQ tablet 2TABS BY MOUTH DAILY X1DAY,THEN 1 TAB AS NEED WHEN YOU TAKE LASIX  FOR FLUID RETENTION/ANKLE SWELLING   warfarin (COUMADIN ) 5 MG tablet TAKE 1 TABLET BY MOUTH DAILY. EXCEPT 1 AND 1/2 TABLETS ON MONDAYS OR AS DIRECTED BY COAGULATION CLINIC   zinc gluconate 50 MG tablet Take 50 mg by mouth daily.   No facility-administered encounter medications on file as of 12/15/2023.     Review of Systems  Review of Systems  N/a  Physical Exam  BP 128/68   Pulse 63   Ht 5' 10 (1.778 m)   Wt 232 lb (105.2 kg)   SpO2 96%   BMI 33.29 kg/m   Wt Readings from Last 5 Encounters:  12/15/23 232 lb (105.2 kg)  12/14/23 220 lb (99.8 kg)  12/13/23 231 lb (104.8 kg)  11/29/23 226 lb (102.5 kg)  10/27/23 240 lb 8.4 oz (109.1 kg)    BMI Readings from Last 5 Encounters:  12/15/23 33.29 kg/m  12/14/23 31.57 kg/m  12/13/23 33.15 kg/m  11/29/23 31.97 kg/m  10/27/23 34.02 kg/m     Physical Exam General: Sitting  in chair, no acute distress Eyes: EOMI, no icterus Neck: Supple, no JVP Pulmonary: Diminished throughout on the right but particular in the right base, otherwise clear Cardiovascular: Warm, minimal edema in the extremities Abdomen: Nondistended, sounds present MSK: No synovitis, no joint effusion Neuro: Normal gait, no weakness Psych: Normal mood, full affect   Assessment & Plan:   Dyspnea on exertion: Suspect component of cardiac abnormalities and development of deconditioning.  He has dilated left atrium, likely diastolic dysfunction on TTE 2022.  Most recent chest x-ray is clear, enlarged heart, right hemidiaphragm elevated 11/2022.  Suspect diaphragmatic paralysis which certainly can be contributing.  CT high-resolution in the past clear, repeat 2025 demonstrates endobronchial nodules in the upper lobe.  Hypertensive pneumonitis considered.  See below.  PFTs, spirometry unchanged with reduction in TLC and DLCO possibly reflective of parenchymal changes.  This could be related to elevated right hemidiaphragm, presumed paralysis.  Trelegy prescribed in past, recommend continuing this.  ILD: Mostly endobronchial nodules noted upper lobe, there are some linear or interlobular septal thickening the lower lobes favored to represent atelectasis.  Serologic workup in 2018 negative.  Repeat serologic workup 2025 also negative.  CT scan stable over time, likely indicative of chronic changes from prior amiodarone  toxicity.  Reported exertional hypoxemia: Note from PCP office 03/2023 documents desaturation to 74% on room air with exertion.  No oxygen was prescribed.  No explanation for this.  Ambulated today and briefly dropped to 86% but quickly recovered before oxygen could be applied.  I suspect this could be related to atelectasis with presumed right hemidiaphragm paralysis.  High-resolution CT scan ordered as above.  Preoperative evaluation: Pulmonary medicine is not provide operative clearance  further preoperative risk assessment.  Based on the ARISCAT model patient is low or 1.6% risk of postoperative pulmonary complication assuming duration  of surgery is less than 3 hours.  Duration of surgery is greater than 2 hours patient is intermediate or 13.3% risk of postoperative pulmonary complication.  Notably there is no validated risk to the level where upper endoscopy or colonoscopy.  Given his concern for hypoxemia in the past, I recommend this being done in the hospital setting for closer monitoring post procedurally.   Return in about 6 months (around 06/16/2024) for f/u Dr. Annella.   Christopher JONELLE Annella, MD 12/15/2023

## 2023-12-15 NOTE — Patient Instructions (Signed)
 Nice to see you again  No change in the medication  Continue the Trelegy  Return to clinic in 6 months or sooner as needed

## 2023-12-17 ENCOUNTER — Other Ambulatory Visit: Payer: Self-pay

## 2023-12-17 DIAGNOSIS — Z8719 Personal history of other diseases of the digestive system: Secondary | ICD-10-CM

## 2023-12-17 DIAGNOSIS — K2270 Barrett's esophagus without dysplasia: Secondary | ICD-10-CM

## 2023-12-17 DIAGNOSIS — R195 Other fecal abnormalities: Secondary | ICD-10-CM

## 2023-12-17 DIAGNOSIS — Z860100 Personal history of colon polyps, unspecified: Secondary | ICD-10-CM

## 2023-12-17 MED ORDER — NA SULFATE-K SULFATE-MG SULF 17.5-3.13-1.6 GM/177ML PO SOLN: 1.0000 | Freq: Once | ORAL | 0 refills | Status: AC

## 2023-12-17 NOTE — Telephone Encounter (Signed)
 LMTCB so we can schedule ECL with Dr Avram in the hospital. Dr Darilyn hospital days 10/6 and 10/23.

## 2023-12-17 NOTE — Telephone Encounter (Signed)
 Patient returning call. Requesting a call back. Please advise.   Thank you

## 2023-12-17 NOTE — Telephone Encounter (Signed)
 Pt has been scheduled for hospital ECL with Dr Avram on 10/23 at 7:30 am. Instructions mailed per pt request (pt doesn't have mychart). Prep sent to local pharmacy. Pt informed to hold warfarin 5 days prior to procedure. Clearance received (see TE).   Electronically signed:  Corean Amsterdam, NCMA

## 2023-12-21 ENCOUNTER — Ambulatory Visit: Attending: Internal Medicine

## 2023-12-21 DIAGNOSIS — Z5181 Encounter for therapeutic drug level monitoring: Secondary | ICD-10-CM | POA: Diagnosis not present

## 2023-12-21 DIAGNOSIS — I4819 Other persistent atrial fibrillation: Secondary | ICD-10-CM | POA: Diagnosis not present

## 2023-12-21 LAB — POCT INR: INR: 1.9 — AB (ref 2.0–3.0)

## 2023-12-21 NOTE — Progress Notes (Signed)
 INR 1.9; Please see anticoagulation encounter

## 2023-12-21 NOTE — Patient Instructions (Signed)
 Take 1.5 tablets today only then Continue taking warfarin 1 tablet daily except 1.5 tablets Sundays, Wednesday, and Fridays. Stay consistent with greens each week (1-2 times per week). Recheck INR in 4 weeks per patient request. Coumadin  Clinic: [phone] Fax number [phone]

## 2023-12-22 ENCOUNTER — Encounter: Payer: Medicare Other | Admitting: Internal Medicine

## 2023-12-23 ENCOUNTER — Other Ambulatory Visit: Payer: Self-pay | Admitting: Family

## 2023-12-23 DIAGNOSIS — I10 Essential (primary) hypertension: Secondary | ICD-10-CM

## 2023-12-28 ENCOUNTER — Encounter: Payer: Medicare Other | Admitting: Internal Medicine

## 2023-12-29 NOTE — Telephone Encounter (Signed)
 Copied from CRM #[id number]. Topic: General - Call Back - No Documentation >> Dec 29, 2023  8:08 AM Avram MATSU wrote: Reason for CRM: pt stated he would like to discuss some things with his providers nurse.  [phone]

## 2023-12-30 ENCOUNTER — Telehealth: Payer: Self-pay | Admitting: *Deleted

## 2023-12-30 DIAGNOSIS — E1122 Type 2 diabetes mellitus with diabetic chronic kidney disease: Secondary | ICD-10-CM

## 2023-12-30 DIAGNOSIS — L84 Corns and callosities: Secondary | ICD-10-CM

## 2023-12-30 NOTE — Telephone Encounter (Signed)
 Referral is sent and pt is notified

## 2023-12-30 NOTE — Telephone Encounter (Signed)
 Copied from CRM #[id number]. Topic: Referral - Question >> Dec 30, 2023  8:18 AM Christopher Wade wrote: Reason for CRM: Patient called in stating that provider was suppose to sent out a referral to the foot doctor for him,when he last seen her in office.Patient hasn't heard anything from foot doctor to set up an appointment. He would like to know the status of this referral.

## 2024-01-03 ENCOUNTER — Ambulatory Visit: Admitting: Podiatry

## 2024-01-03 VITALS — Ht 70.0 in | Wt 232.0 lb

## 2024-01-03 DIAGNOSIS — E1142 Type 2 diabetes mellitus with diabetic polyneuropathy: Secondary | ICD-10-CM

## 2024-01-03 DIAGNOSIS — M79674 Pain in right toe(s): Secondary | ICD-10-CM | POA: Diagnosis not present

## 2024-01-03 DIAGNOSIS — B351 Tinea unguium: Secondary | ICD-10-CM | POA: Diagnosis not present

## 2024-01-03 DIAGNOSIS — M79675 Pain in left toe(s): Secondary | ICD-10-CM | POA: Diagnosis not present

## 2024-01-03 NOTE — Progress Notes (Signed)
  Subjective:  Patient ID: Christopher Wade, male    DOB: 02-04-1951,  MRN: [id number]  Chief Complaint  Patient presents with   Nail Problem   Numbness    RM 23 Patient is here for nail trimming and dry flaky skin along the interior/exterior sides of the foot. Patient is also concerned with numbing/tingling sensation in both feet.  Patient states numbness and tingling sensation has been present for the last 18 months. (Patient states A1C is above 7).    73 y.o. male presents with the above complaint. History confirmed with patient.   Objective:  Physical Exam: warm, good capillary refill, no trophic changes or ulcerative lesions, normal DP and PT pulses, and normal sensory exam. Left Foot: dystrophic yellowed discolored nail plates with subungual debris Right Foot: dystrophic yellowed discolored nail plates with subungual debris  Assessment:   1. Pain due to onychomycosis of toenails of both feet   2. Diabetic polyneuropathy associated with type 2 diabetes mellitus (HCC)      Plan:  Patient was evaluated and treated and all questions answered.  Patient educated on diabetes. Discussed proper diabetic foot care and discussed risks and complications of disease. Educated patient in depth on reasons to return to the office immediately should he/she discover anything concerning or new on the feet. All questions answered. Discussed proper shoes as well.   Discussed the etiology and treatment options for the condition in detail with the patient. Recommended debridement of the nails today. Sharp and mechanical debridement performed of all painful and mycotic nails today. Nails debrided in length and thickness using a nail nipper to level of comfort. Follow up as needed for painful nails.    Return in about 3 months (around 04/04/2024) for at risk diabetic foot care.

## 2024-01-04 ENCOUNTER — Encounter (HOSPITAL_BASED_OUTPATIENT_CLINIC_OR_DEPARTMENT_OTHER): Admitting: Cardiology

## 2024-01-18 ENCOUNTER — Ambulatory Visit: Attending: Cardiology | Admitting: Pharmacist

## 2024-01-18 DIAGNOSIS — I4819 Other persistent atrial fibrillation: Secondary | ICD-10-CM

## 2024-01-18 DIAGNOSIS — Z5181 Encounter for therapeutic drug level monitoring: Secondary | ICD-10-CM

## 2024-01-18 LAB — POCT INR: INR: 1.6 — AB (ref 2.0–3.0)

## 2024-01-18 NOTE — Patient Instructions (Signed)
 Description   Increase dose to 1.5 tablets Sundays, Tuesdays, Wednesday, and Fridays. 1 tablet on Monday, Thursday, and Saturday. Stay consistent with greens each week (1-2 times per week). Recheck INR in 4 weeks per patient request. Coumadin  Clinic: [phone] Fax number [phone]

## 2024-01-18 NOTE — Progress Notes (Addendum)
 INR 1.6; Please see anticoagulation encounter   Description   Increase dose to 1.5 tablets Sundays, Tuesdays, Wednesday, and Fridays. 1 tablet on Monday, Thursday, and Saturday. Stay consistent with greens each week (1-2 times per week). Recheck INR in 4 weeks per patient request. Coumadin  Clinic: [phone] Fax number [phone]

## 2024-01-25 ENCOUNTER — Other Ambulatory Visit: Payer: Self-pay | Admitting: Physician Assistant

## 2024-01-25 DIAGNOSIS — Z8719 Personal history of other diseases of the digestive system: Secondary | ICD-10-CM

## 2024-01-25 DIAGNOSIS — Z860100 Personal history of colon polyps, unspecified: Secondary | ICD-10-CM

## 2024-01-25 DIAGNOSIS — K2270 Barrett's esophagus without dysplasia: Secondary | ICD-10-CM

## 2024-01-25 DIAGNOSIS — R195 Other fecal abnormalities: Secondary | ICD-10-CM

## 2024-01-27 ENCOUNTER — Encounter: Payer: Self-pay | Admitting: Physician Assistant

## 2024-01-31 ENCOUNTER — Other Ambulatory Visit: Payer: Self-pay | Admitting: Family Medicine

## 2024-01-31 DIAGNOSIS — R609 Edema, unspecified: Secondary | ICD-10-CM

## 2024-01-31 NOTE — Telephone Encounter (Unsigned)
 Copied from CRM [id number]. Topic: Clinical - Medication Refill >> Jan 31, 2024 12:23 PM [place] M wrote: Medication: furosemide  (LASIX ) 40 MG tablet  Has the patient contacted their pharmacy? Yes (Agent: If no, request that the patient contact the pharmacy for the refill. If patient does not wish to contact the pharmacy document the reason why and proceed with request.) (Agent: If yes, when and what did the pharmacy advise?)  This is the patient's preferred pharmacy:  [id number] - [place], [place] - [address] AT [facility] [address] [place] [address] Phone: [phone] Fax: [phone]  Is this the correct pharmacy for this prescription? Yes If no, delete pharmacy and type the correct one.   Has the prescription been filled recently? Yes  Is the patient out of the medication? Yes  Has the patient been seen for an appointment in the last year OR does the patient have an upcoming appointment? Yes  Can we respond through MyChart? Yes  Agent: Please be advised that Rx refills may take up to 3 business days. We ask that you follow-up with your pharmacy.

## 2024-02-01 MED ORDER — FUROSEMIDE 40 MG PO TABS: ORAL_TABLET | ORAL | 1 refills | Status: AC

## 2024-02-07 ENCOUNTER — Other Ambulatory Visit: Payer: Self-pay | Admitting: Family Medicine

## 2024-02-07 MED ORDER — ALLOPURINOL 300 MG PO TABS: 300.0000 mg | ORAL_TABLET | Freq: Every day | ORAL | 0 refills | Status: DC

## 2024-02-07 NOTE — Telephone Encounter (Signed)
 Copied from CRM [id number]. Topic: Clinical - Medication Refill >> Feb 07, 2024  1:48 PM Corin V wrote: Medication: allopurinol  (ZYLOPRIM ) 300 MG tablet  Has the patient contacted their pharmacy? Yes (Agent: If no, request that the patient contact the pharmacy for the refill. If patient does not wish to contact the pharmacy document the reason why and proceed with request.) (Agent: If yes, when and what did the pharmacy advise?)  This is the patient's preferred pharmacy:  [id number] - [place], [place] - [address] AT [facility] [address] [place] [address] Phone: [phone] Fax: [phone]  Is this the correct pharmacy for this prescription? Yes If no, delete pharmacy and type the correct one.   Has the prescription been filled recently? No  Is the patient out of the medication? No- has 2 days left  Has the patient been seen for an appointment in the last year OR does the patient have an upcoming appointment? Yes  Can we respond through MyChart? Yes  Agent: Please be advised that Rx refills may take up to 3 business days. We ask that you follow-up with your pharmacy.

## 2024-02-08 ENCOUNTER — Other Ambulatory Visit: Payer: Self-pay | Admitting: Family Medicine

## 2024-02-08 ENCOUNTER — Other Ambulatory Visit: Payer: Self-pay | Admitting: Cardiology

## 2024-02-08 DIAGNOSIS — I10 Essential (primary) hypertension: Secondary | ICD-10-CM

## 2024-02-08 DIAGNOSIS — I4819 Other persistent atrial fibrillation: Secondary | ICD-10-CM

## 2024-02-09 ENCOUNTER — Telehealth: Payer: Self-pay | Admitting: Cardiology

## 2024-02-09 ENCOUNTER — Other Ambulatory Visit: Payer: Self-pay | Admitting: Family Medicine

## 2024-02-09 MED ORDER — PANTOPRAZOLE SODIUM 40 MG PO TBEC: DELAYED_RELEASE_TABLET | ORAL | 3 refills | Status: AC

## 2024-02-09 NOTE — Telephone Encounter (Signed)
Forward to anticoag clinic

## 2024-02-09 NOTE — Telephone Encounter (Signed)
 Copied from CRM #[id number]. Topic: Clinical - Medication Refill >> Feb 09, 2024  8:12 AM Thersia C wrote: Medication: pantoprazole  (PROTONIX ) 40 MG tablet  Has the patient contacted their pharmacy? Yes (Agent: If no, request that the patient contact the pharmacy for the refill. If patient does not wish to contact the pharmacy document the reason why and proceed with request.) (Agent: If yes, when and what did the pharmacy advise?)  This is the patient's preferred pharmacy:  [id number] - [place], [place] - [address] AT [facility] [address] [place] [address] Phone: [phone] Fax: [phone]  Is this the correct pharmacy for this prescription? Yes If no, delete pharmacy and type the correct one.   Has the prescription been filled recently? No  Is the patient out of the medication? Yes  Has the patient been seen for an appointment in the last year OR does the patient have an upcoming appointment? Yes  Can we respond through MyChart? Yes  Agent: Please be advised that Rx refills may take up to 3 business days. We ask that you follow-up with your pharmacy.

## 2024-02-09 NOTE — Telephone Encounter (Addendum)
 Called pt.  No answer and mailbox is full.  Will try again later.  9:45am  No answer

## 2024-02-09 NOTE — Telephone Encounter (Signed)
 Pt c/o medication issue:  1. Name of Medication: warfarin (COUMADIN ) 5 MG tablet   2. How are you currently taking this medication (dosage and times per day)?   3. Are you having a reaction (difficulty breathing--STAT)? no  4. What is your medication issue? Patient states the prescription direction is incorrect. Please advise

## 2024-02-10 ENCOUNTER — Telehealth: Payer: Self-pay

## 2024-02-10 NOTE — Telephone Encounter (Signed)
 Lpmtcb to discuss Coumadin  dose wording on prescription.

## 2024-02-10 NOTE — Telephone Encounter (Signed)
Tried calling patient, mailbox full

## 2024-02-15 ENCOUNTER — Ambulatory Visit: Attending: Cardiology | Admitting: *Deleted

## 2024-02-15 DIAGNOSIS — I4819 Other persistent atrial fibrillation: Secondary | ICD-10-CM | POA: Diagnosis not present

## 2024-02-15 DIAGNOSIS — Z5181 Encounter for therapeutic drug level monitoring: Secondary | ICD-10-CM

## 2024-02-15 LAB — POCT INR: INR: 2.2 (ref 2.0–3.0)

## 2024-02-15 NOTE — Progress Notes (Signed)
 Description   INR-2.2; Continue taking warfarin 1.5 tablets daily except 1 tablet on Monday, Thursday, and Saturday. Stay consistent with greens each week (1-2 times per week). Recheck INR in 1 week after procedure. Coumadin  Clinic: [phone] Fax number [phone]

## 2024-02-15 NOTE — Patient Instructions (Signed)
 Description   INR-2.2; Continue taking warfarin 1.5 tablets daily except 1 tablet on Monday, Thursday, and Saturday. Stay consistent with greens each week (1-2 times per week). Recheck INR in 4 weeks per patient request. Coumadin  Clinic: [phone] Fax number [phone]    Take an extra 1/2 tablet of warfarin for 2 days after procedure then resume normal dose.

## 2024-02-25 ENCOUNTER — Telehealth: Payer: Self-pay | Admitting: Internal Medicine

## 2024-02-25 NOTE — Telephone Encounter (Signed)
 Confirmed with patient about stopping Warfarin 5 days prior to the procedure. Understands the hospital nurse will contact him to discuss which medications he will take before the procedure. Patient asks if he will need to hold his iron  supplement before the colonoscopy.

## 2024-02-25 NOTE — Telephone Encounter (Signed)
 Information left on the voicemail of the patient.

## 2024-02-25 NOTE — Telephone Encounter (Signed)
 Inbound call from patient stating he would like to speak to nurse in regards to upcoming procedure and withholding his medication. Requesting a call back  Please advise  Thank you

## 2024-02-25 NOTE — Telephone Encounter (Signed)
 Hold iron  3 days before procedure

## 2024-03-05 ENCOUNTER — Other Ambulatory Visit: Payer: Self-pay | Admitting: Family Medicine

## 2024-03-05 DIAGNOSIS — I10 Essential (primary) hypertension: Secondary | ICD-10-CM

## 2024-03-08 ENCOUNTER — Telehealth: Payer: Self-pay | Admitting: Gastroenterology

## 2024-03-08 NOTE — Telephone Encounter (Addendum)
 Procedure:Endoscopy/Colonoscopy Procedure date: 03/16/24 Procedure location: [facility] Arrival Time: 6:00 am Spoke with the patient Y/N: Yes Any prep concerns? No  Has the patient obtained the prep from the pharmacy ? Yes Do you have a care partner and transportation: Yes Any additional concerns? No

## 2024-03-09 ENCOUNTER — Encounter (HOSPITAL_COMMUNITY): Payer: Self-pay | Admitting: Internal Medicine

## 2024-03-09 NOTE — Progress Notes (Signed)
 Attempted to obtain medical history for pre op call via telephone, unable to reach at this time. HIPAA compliant voicemail message left requesting return call to pre surgical testing department.

## 2024-03-14 ENCOUNTER — Ambulatory Visit: Admitting: Family Medicine

## 2024-03-14 ENCOUNTER — Encounter: Payer: Self-pay | Admitting: Family Medicine

## 2024-03-14 VITALS — BP 138/80 | HR 70 | Temp 98.4°F | Ht 70.0 in | Wt 233.0 lb

## 2024-03-14 DIAGNOSIS — K2270 Barrett's esophagus without dysplasia: Secondary | ICD-10-CM

## 2024-03-14 DIAGNOSIS — E785 Hyperlipidemia, unspecified: Secondary | ICD-10-CM | POA: Diagnosis not present

## 2024-03-14 DIAGNOSIS — I10 Essential (primary) hypertension: Secondary | ICD-10-CM | POA: Diagnosis not present

## 2024-03-14 DIAGNOSIS — Z23 Encounter for immunization: Secondary | ICD-10-CM

## 2024-03-14 DIAGNOSIS — E1122 Type 2 diabetes mellitus with diabetic chronic kidney disease: Secondary | ICD-10-CM

## 2024-03-14 DIAGNOSIS — N182 Chronic kidney disease, stage 2 (mild): Secondary | ICD-10-CM

## 2024-03-14 DIAGNOSIS — M109 Gout, unspecified: Secondary | ICD-10-CM

## 2024-03-14 DIAGNOSIS — R0609 Other forms of dyspnea: Secondary | ICD-10-CM

## 2024-03-14 DIAGNOSIS — Z7984 Long term (current) use of oral hypoglycemic drugs: Secondary | ICD-10-CM | POA: Diagnosis not present

## 2024-03-14 DIAGNOSIS — E1169 Type 2 diabetes mellitus with other specified complication: Secondary | ICD-10-CM | POA: Diagnosis not present

## 2024-03-14 DIAGNOSIS — E039 Hypothyroidism, unspecified: Secondary | ICD-10-CM

## 2024-03-14 DIAGNOSIS — R600 Localized edema: Secondary | ICD-10-CM

## 2024-03-14 LAB — COMPREHENSIVE METABOLIC PANEL WITH GFR
ALT: 18 U/L (ref 0–53)
AST: 19 U/L (ref 0–37)
Albumin: 4.5 g/dL (ref 3.5–5.2)
Alkaline Phosphatase: 82 U/L (ref 39–117)
BUN: 15 mg/dL (ref 6–23)
CO2: 22 meq/L (ref 19–32)
Calcium: 9.1 mg/dL (ref 8.4–10.5)
Chloride: 101 meq/L (ref 96–112)
Creatinine, Ser: 1.59 mg/dL — ABNORMAL HIGH (ref 0.40–1.50)
GFR: 42.68 mL/min — ABNORMAL LOW (ref >60.00)
Glucose, Bld: 127 mg/dL — ABNORMAL HIGH (ref 70–99)
Potassium: 3.5 meq/L (ref 3.5–5.1)
Sodium: 138 meq/L (ref 135–145)
Total Bilirubin: 1.4 mg/dL — ABNORMAL HIGH (ref 0.2–1.2)
Total Protein: 7.6 g/dL (ref 6.0–8.3)

## 2024-03-14 LAB — LIPID PANEL
Cholesterol: 114 mg/dL (ref 0–200)
HDL: 32.6 mg/dL — ABNORMAL LOW (ref >39.00)
LDL Cholesterol: 37 mg/dL (ref 0–99)
NonHDL: 81.38
Total CHOL/HDL Ratio: 3
Triglycerides: 223 mg/dL — ABNORMAL HIGH (ref 0.0–149.0)
VLDL: 44.6 mg/dL — ABNORMAL HIGH (ref 0.0–40.0)

## 2024-03-14 LAB — CBC WITH DIFFERENTIAL/PLATELET
Basophils Absolute: 0 K/uL (ref 0.0–0.1)
Basophils Relative: 0.3 % (ref 0.0–3.0)
Eosinophils Absolute: 0.1 K/uL (ref 0.0–0.7)
Eosinophils Relative: 1.2 % (ref 0.0–5.0)
HCT: 44.1 % (ref 39.0–52.0)
Hemoglobin: 14.9 g/dL (ref 13.0–17.0)
Lymphocytes Relative: 17.2 % (ref 12.0–46.0)
Lymphs Abs: 1.6 K/uL (ref 0.7–4.0)
MCHC: 33.7 g/dL (ref 30.0–36.0)
MCV: 103.5 fl — ABNORMAL HIGH (ref 78.0–100.0)
Monocytes Absolute: 0.8 K/uL (ref 0.1–1.0)
Monocytes Relative: 9 % (ref 3.0–12.0)
Neutro Abs: 6.6 K/uL (ref 1.4–7.7)
Neutrophils Relative %: 72.3 % (ref 43.0–77.0)
Platelets: 235 K/uL (ref 150.0–400.0)
RBC: 4.26 Mil/uL (ref 4.22–5.81)
RDW: 15.5 % (ref 11.5–15.5)
WBC: 9.1 K/uL (ref 4.0–10.5)

## 2024-03-14 LAB — URIC ACID: Uric Acid, Serum: 6 mg/dL (ref 4.0–7.8)

## 2024-03-14 LAB — HEMOGLOBIN A1C: Hgb A1c MFr Bld: 6.6 % — ABNORMAL HIGH (ref 4.6–6.5)

## 2024-03-14 LAB — TSH: TSH: 1.81 u[IU]/mL (ref 0.35–5.50)

## 2024-03-14 MED ORDER — COLCHICINE 0.6 MG PO TABS: ORAL_TABLET | ORAL | 3 refills | Status: AC

## 2024-03-14 NOTE — Patient Instructions (Addendum)
-  It was good to see you today.  -Continue with all medications. Refilled Colchicine . -Blood pressure is elevated. Placed a referral to the Hypertension Clinic for blood pressure management.  -Ordered labs. Office will call with lab results and will be available via MyChart. -Influenza vaccine provided.  -Follow up in 3 months for physical.

## 2024-03-14 NOTE — Assessment & Plan Note (Signed)
 Stable. Continue with Levothyroxine  100mcg daily. Ordered TSH.

## 2024-03-14 NOTE — Assessment & Plan Note (Addendum)
 Stable LDL, elevated triglycerides and low HDL. Continue with Atorvastatin  80mg  daily. Ordered lipid and CMP to assess liver function.

## 2024-03-14 NOTE — Assessment & Plan Note (Signed)
 Continue taking Allopurinol  300mg  and Colchicine  as needed for gout flare ups. Refilled colchicine . Ordered uric acid level to define if he has a current gout flare up.

## 2024-03-14 NOTE — Progress Notes (Signed)
 Established Patient Office Visit   Subjective:  Patient ID: Christopher Wade, male    DOB: 03-Mar-1951  Age: 73 y.o. MRN: [id number]  Chief Complaint  Patient presents with   Medical Management of Chronic Issues    3 month follow up     HPI Gout: Chronic. Patient was prescribed Allopurinol  300mg  tablet daily. He reports he has been taking medication for 6 years. He discontinued Allopurinol  to see if he could come off a medication. However, he had two gout attacks where he had to Colchicine . He restarted Allopurinol  300mg  daily. He reports he is having lower extremity edema with pain in both feet. This is chronic, but recently worse. He reports took Colchicine  on Sunday and this helped with pain and edema.    HTN: Chronic. Patient is prescribed Amlodipine  10mg  daily, Carvedilol  12.5mg  BID, and Olmesartan  40mg  daily.  He reports monitors his blood pressure at home. He reports his blood pressures have been elevated, above 140s/60-80s. Denies CP and HA. Reports SHOB, under the care of pulmonology. Lightheadedness when getting out in the heat. Reports intermittent lower extremity edema. Patient is following up with [facility] with Dr. Shlomo on 12/09.  BP Readings from Last 3 Encounters:  03/14/24 138/80  12/15/23 128/68  12/13/23 132/78    Hyperlipidemia: Chronic. Patient is taking Atorvastatin  80mg  daily. Denies muscle or joint pain, abd pain, nausea, or vomiting. Lab Results  Component Value Date   CHOL 124 12/13/2023   HDL 37.30 (L) 12/13/2023   LDLCALC 52 12/13/2023   TRIG 175.0 (H) 12/13/2023   CHOLHDL 3 12/13/2023    SHOB: Patient is prescribed Trelegy Inhaler, takes 1 inhalation daily. He reports this is helping more than Albuterol  Inhaler as needed. Patient is following up with [facility] with Dr. Annella on 12/18.    Lower extremity edema: Chronic. Patient is prescribed Furosemide  40mg  tablet, take 1 tablet 1-2 times per day as needed. He reports he takes 2 tablets a day,  one in the morning and evening. He reports his lower edema improves with elevating his legs.    Hypothyroidism: Chronic. Patient is prescribed Levothyroxine  100 mcg daily. Sometimes he has brittle nails. No other symptoms identified for hypothyroidism. Lab Results  Component Value Date   TSH 1.80 12/13/2023    Diabetes: Chronic. Patient prescribed Metformin  XR 500mg  tablet, 1 tablet in the morning and 2 tablets in the evening. He reports he has not been monitoring his blood sugars at home. He reports he has increased urination, but relates this to drinking a lot of water.  Lab Results  Component Value Date   HGBA1C 7.0 (H) 12/13/2023    GERD: Chronic. Patient prescribed Pantoprazole  40mg  daily. Sometimes it is effective, other times it is not.  ROS See HPI above     Objective:   BP 138/80   Pulse 70   Temp 98.4 F (36.9 C) (Oral)   Ht 5' 10 (1.778 m)   Wt 233 lb (105.7 kg)   SpO2 96%   BMI 33.43 kg/m    Physical Exam Vitals reviewed.  Constitutional:      General: He is not in acute distress.    Appearance: Normal appearance. He is not ill-appearing, toxic-appearing or diaphoretic.  Eyes:     General:        Right eye: No discharge.        Left eye: No discharge.     Conjunctiva/sclera: Conjunctivae normal.  Cardiovascular:     Rate and Rhythm: Normal rate  and regular rhythm.     Heart sounds: Normal heart sounds. No murmur heard.    No friction rub. No gallop.  Pulmonary:     Effort: Pulmonary effort is normal. No respiratory distress.     Breath sounds: Normal breath sounds.  Musculoskeletal:        General: Normal range of motion.  Skin:    General: Skin is warm and dry.  Neurological:     General: No focal deficit present.     Mental Status: He is alert and oriented to person, place, and time. Mental status is at baseline.  Psychiatric:        Mood and Affect: Mood normal.        Behavior: Behavior normal.        Thought Content: Thought content normal.         Judgment: Judgment normal.      Assessment & Plan:  Essential hypertension Assessment & Plan: Uncontrolled based on today's reading and reported readings at home. Continue Amlodipine  10mg  daily, Carvedilol  12.5mg  BID, and Olmesartan  40mg  daily.  However, referred patient to [facility] for more assistance with blood pressure coverage. Ordered CBC and CMP.  -Also, under the care of cardiology, has an appointment in December.   Orders: -     Refer to [facility] ([id number]) -     CBC with Differential/Platelet -     Comprehensive metabolic panel with GFR  Gout, unspecified cause, unspecified chronicity, unspecified site Assessment & Plan: Continue taking Allopurinol  300mg  and Colchicine  as needed for gout flare ups. Refilled colchicine . Ordered uric acid level to define if he has a current gout flare up.   Orders: -     Colchicine ; take 2 tablet daily now with start of a flare up, take 1 tablet an hour later  Dispense: 3 tablet; Refill: 3 -     Uric acid  Hyperlipidemia associated with type 2 diabetes mellitus (HCC) Assessment & Plan: Stable LDL, elevated triglycerides and low HDL. Continue with Atorvastatin  80mg  daily. Ordered lipid and CMP to assess liver function.     Orders: -     Comprehensive metabolic panel with GFR -     Lipid panel  Lower extremity edema Assessment & Plan: Uncontrolled. Continue Furosemide  40mg  tablet, take 1-2 tablets per day. Encouraged patient to elevate his legs at heart level and wear compression stockings during the day.      Type 2 diabetes mellitus with stage 2 chronic kidney disease, without long-term current use of insulin  (HCC) Assessment & Plan: Stable. Continue Metformin  XR 500mg , 1 tablet in AM and 2 tablets in PM. Ordered A1c and CMP to assess kidney function. UTD on microalbumin/creatinine urine ratio and foot exam. Patient is taking a statin and on an ARB. He reports he has an ophthalmology appointment  scheduled for next week.       Orders: -     Comprehensive metabolic panel with GFR -     Hemoglobin A1c  Barrett's esophagus without dysplasia Assessment & Plan: Continue Pantoprazole  40mg  daily. EGD/colonoscopy with GI scheduled on 10/23. He is prepping for this procedure.      Hypothyroidism, unspecified type Assessment & Plan: Stable. Continue with Levothyroxine  100mcg daily. Ordered TSH.       Orders: -     TSH  Dyspnea on exertion Assessment & Plan: Stable. Continue Trelegy Inhaler daily.  -Has pulmonary visit in December.      Immunization due -     Flu vaccine  HIGH DOSE PF(Fluzone Trivalent)  1.Review health maintenance:  -Covid booster: Obtain at local pharmacy  -Influenza vaccine: Administer -Ophthalmology: Scheduled for next week  -Zoster vaccine: [facility] on [address]   Return in about 3 months (around 06/14/2024) for physical.   Adrianna Dudas, NP

## 2024-03-14 NOTE — Assessment & Plan Note (Signed)
 Continue Pantoprazole  40mg  daily. EGD/colonoscopy with GI scheduled on 10/23. He is prepping for this procedure.

## 2024-03-14 NOTE — Assessment & Plan Note (Signed)
 Stable. Continue Trelegy Inhaler daily.  -Has pulmonary visit in December.

## 2024-03-14 NOTE — Assessment & Plan Note (Signed)
 Uncontrolled. Continue Furosemide  40mg  tablet, take 1-2 tablets per day. Encouraged patient to elevate his legs at heart level and wear compression stockings during the day.

## 2024-03-14 NOTE — Assessment & Plan Note (Addendum)
 Uncontrolled based on today's reading and reported readings at home. Continue Amlodipine  10mg  daily, Carvedilol  12.5mg  BID, and Olmesartan  40mg  daily.  However, referred patient to [facility] for more assistance with blood pressure coverage. Ordered CBC and CMP.  -Also, under the care of cardiology, has an appointment in December.

## 2024-03-14 NOTE — Assessment & Plan Note (Signed)
 Stable. Continue Metformin  XR 500mg , 1 tablet in AM and 2 tablets in PM. Ordered A1c and CMP to assess kidney function. UTD on microalbumin/creatinine urine ratio and foot exam. Patient is taking a statin and on an ARB. He reports he has an ophthalmology appointment scheduled for next week.

## 2024-03-15 ENCOUNTER — Ambulatory Visit: Payer: Self-pay | Admitting: Family Medicine

## 2024-03-15 DIAGNOSIS — I2510 Atherosclerotic heart disease of native coronary artery without angina pectoris: Secondary | ICD-10-CM

## 2024-03-15 DIAGNOSIS — Z79899 Other long term (current) drug therapy: Secondary | ICD-10-CM

## 2024-03-15 NOTE — H&P (Signed)
 [facility] History and Physical   Primary Care Physician:  Billy Philippe SAUNDERS, NP   Reason for Procedure:   Barrett's esophagus and history of colon polyp  Plan:    EGD and colonoscopy, off warfarin.     HPI: Christopher Wade is a 73 y.o. male w/ recently diagnosed pulmonary htn here for follow-up of short-segment Barrett's esophagus and hx of sessile serrated colon polyp. He was seen in our clinic in July and procedures were not scheuled until ok form cardiology to hold warfarin (Afib) and proceed w/ sedation.   Past Medical History:  Diagnosis Date   AKI (acute kidney injury) 05/27/2020   Arthritis    joints get stiff (06/11/2016)   Bee sting allergy    Bee sting allergy    CAD (coronary artery disease), native coronary artery 02/12/2016   cath showing 50% LM, 40% distal LAD, 70% ost to prox LCs, 40% mid LAD and 95% mid to dist RCA s/p PCI with DES x 2 and stage PCI of the LCx/OM.   Carotid artery stenosis    1-39% bilateral carotid stenosis by dopples 06/2022   Difficult airway 11/30/2023   Duodenal ulcer    History of gout    Hx of adenomatous colonic polyps 04/10/2008   Hyperlipidemia    Hypertension    Hypothyroidism    OSA (obstructive sleep apnea)    tried CPAP; couldn't do it (06/11/2016)   Persistent atrial fibrillation (HCC)    s/p DCCV 03/2015 with reversion back to atrial fibrillation, loaded with Amio and s/p DCCV to NSR 11/2015.  He is now s/p afib albation.  He had amio lung toxicity and amio was stopped.   Subclavian artery stenosis    right subclavian artery stenosis by dopplers 2/24   Thyroid  disease    Type II diabetes mellitus (HCC)    Vitamin D  deficiency     Past Surgical History:  Procedure Laterality Date   BIOPSY  05/28/2020   Procedure: BIOPSY;  Surgeon: Albertus Gordy HERO, MD;  Location: [facility] ENDOSCOPY;  Service: Gastroenterology;;   CARDIAC CATHETERIZATION N/A 10/11/2015   Procedure: Left Heart Cath and Coronary Angiography;   Surgeon: Maude JAYSON Emmer, MD;  Location: [facility] INVASIVE CV LAB;  Service: Cardiovascular;  Laterality: N/A;   CARDIAC CATHETERIZATION N/A 10/11/2015   Procedure: Coronary Stent Intervention;  Surgeon: Peter M Swaziland, MD;  Location: [facility] INVASIVE CV LAB;  Service: Cardiovascular;  Laterality: N/A;   CARDIAC CATHETERIZATION N/A 10/11/2015   Procedure: Intravascular Pressure Wire/FFR Study;  Surgeon: Peter M Swaziland, MD;  Location: [facility] INVASIVE CV LAB;  Service: Cardiovascular;  Laterality: N/A;   CARDIAC CATHETERIZATION N/A 10/14/2015   Procedure: Coronary Stent Intervention;  Surgeon: Candyce GORMAN Reek, MD;  Location: [facility] INVASIVE CV LAB;  Service: Cardiovascular;  Laterality: N/A;   CARDIOVERSION N/A 04/05/2015   Procedure: CARDIOVERSION;  Surgeon: Wilbert SAUNDERS Bihari, MD;  Location: [facility] ENDOSCOPY;  Service: Cardiovascular;  Laterality: N/A;   CARDIOVERSION N/A 12/09/2015   Procedure: CARDIOVERSION;  Surgeon: Wilbert SAUNDERS Bihari, MD;  Location: [facility] ENDOSCOPY;  Service: Cardiovascular;  Laterality: N/A;   CARDIOVERSION N/A 03/16/2016   Procedure: CARDIOVERSION;  Surgeon: [name] Scarce, MD;  Location: [facility] ENDOSCOPY;  Service: Cardiovascular;  Laterality: N/A;   CATARACT EXTRACTION Right 2019   Dr. Jens   CORONARY ANGIOPLASTY     ELECTROPHYSIOLOGIC STUDY N/A 06/11/2016   Procedure: Atrial Fibrillation Ablation;  Surgeon: Will Gladis Norton, MD;  Location: [facility] INVASIVE CV LAB;  Service: Cardiovascular;  Laterality: N/A;  ESOPHAGOGASTRODUODENOSCOPY (EGD) WITH PROPOFOL  N/A 05/28/2020   Procedure: ESOPHAGOGASTRODUODENOSCOPY (EGD) WITH PROPOFOL ;  Surgeon: Albertus Gordy HERO, MD;  Location: [facility] ENDOSCOPY;  Service: Gastroenterology;  Laterality: N/A;   TOTAL HIP ARTHROPLASTY Bilateral 2001   UVULECTOMY       No current facility-administered medications for this encounter.   Current Outpatient Medications  Medication Sig Dispense Refill   acetaminophen  (TYLENOL ) 325 MG tablet Take 325 mg by mouth as needed for mild pain.      allopurinol  (ZYLOPRIM ) 300 MG tablet Take 1 tablet (300 mg total) by mouth daily. 90 tablet 0   amLODipine  (NORVASC ) 10 MG tablet TAKE 1 TABLET BY MOUTH EVERY DAY FOR BLOOD PRESSURE AND HEART 90 tablet 1   Ascorbic Acid (VITAMIN C WITH ROSE HIPS) 500 MG tablet Take 500 mg by mouth daily.     atorvastatin  (LIPITOR ) 80 MG tablet TAKE 1/2 TABLET BY MOUTH DAILY 30 tablet 3   B Complex Vitamins (B COMPLEX PO) Take 1 tablet by mouth daily.     carvedilol  (COREG ) 12.5 MG tablet TAKE 1 TABLET (12.5MG  TOTAL) BY MOUTH TWICE A DAY WITH MEALS 180 tablet 0   cholecalciferol  (VITAMIN D ) 1000 UNITS tablet Take 1,000 Units by mouth daily.     colchicine  0.6 MG tablet take 2 tablet daily now with start of a flare up, take 1 tablet an hour later 3 tablet 3   Ferrous Sulfate (IRON ) 325 (65 Fe) MG TABS Take 1 tablet (325 mg total) by mouth every other day. 30 tablet 0   Fluticasone-Umeclidin-Vilant (TRELEGY ELLIPTA ) 100-62.5-25 MCG/ACT AEPB Inhale 100 mcg into the lungs daily. 60 each 6   furosemide  (LASIX ) 40 MG tablet TAKE 1 TABLET BY MOUTH 1 TO 2 TIMES PER DAY AS NEEDED FOR FLUID RETENTION OR ANKLE SWELLING 180 tablet 1   levothyroxine  (SYNTHROID ) 100 MCG tablet Take  1 tablet  Daily  on an empty stomach with only water for 30 minutes & no Antacid meds, Calcium  or Magnesium  for 4 hours & avoid Biotin                                                                                 /                                         TAKE                                                     BY                                                  MOUTH 90 tablet 3   loratadine  (CLARITIN ) 10 MG tablet Take 10 mg by mouth daily.     metFORMIN  (GLUCOPHAGE -XR) 500 MG 24 hr tablet Take  1 tablets in the morning and 2 tablets in the evening. 360 tablet 1   nystatin  cream (MYCOSTATIN ) Apply 1 Application topically 2 (two) times daily.     olmesartan  (BENICAR ) 40 MG tablet TAKE 1 TABLET (40 MG TOTAL) BY MOUTH DAILY FOR BLOOD PRESSURE 90 tablet  0   Omega-3 Fatty Acids (FISH OIL) 1200 MG CAPS Take 1,200 mg by mouth daily.     pantoprazole  (PROTONIX ) 40 MG tablet TAKE 1 TABLET BY MOUTH EVERY DAY BEFORE BREAKFAST 90 tablet 3   potassium chloride  SA (KLOR-CON  M) 20 MEQ tablet 2TABS BY MOUTH DAILY X1DAY,THEN 1 TAB AS NEED WHEN YOU TAKE LASIX  FOR FLUID RETENTION/ANKLE SWELLING 90 tablet 2   warfarin (COUMADIN ) 5 MG tablet TAKE 1 TABLET BY MOUTH DAILY. EXCEPT 1 AND 1/2 TABS ON MONDAYS OR AS DIRECTED BY COAGULATION CLINIC 40 tablet 1   zinc gluconate 50 MG tablet Take 50 mg by mouth daily.      Allergies as of 12/17/2023 - Review Complete 12/15/2023  Allergen Reaction Noted   Amiodarone  Other (See Comments) 07/23/2016   Bee venom Other (See Comments) 04/05/2015    Family History  Problem Relation Age of Onset   Lung cancer Mother    Liver disease Mother    Lung cancer Father    Liver disease Father    Colon cancer Sister    Liver disease Sister    Diabetes Sister    Prostate cancer Brother    Liver disease Brother    Stomach cancer Neg Hx    Pancreatic cancer Neg Hx    Esophageal cancer Neg Hx     Social History   Socioeconomic History   Marital status: Single    Spouse name: Not on file   Number of children: 0   Years of education: Not on file   Highest education level: High school graduate  Occupational History   Occupation: retired  Tobacco Use   Smoking status: Never   Smokeless tobacco: Never  Vaping Use   Vaping status: Never Used  Substance and Sexual Activity   Alcohol use: No   Drug use: No   Sexual activity: Not Currently  Other Topics Concern   Not on file  Social History Narrative   Not on file   Social Drivers of Health   Financial Resource Strain: Low Risk  (12/14/2023)   Overall Financial Resource Strain (CARDIA)    Difficulty of Paying Living Expenses: Not hard at all  Food Insecurity: No Food Insecurity (12/14/2023)   Hunger Vital Sign    Worried About Running Out of Food in the Last Year:  Never true    Ran Out of Food in the Last Year: Never true  Transportation Needs: No Transportation Needs (12/14/2023)   PRAPARE - Administrator, Civil Service (Medical): No    Lack of Transportation (Non-Medical): No  Physical Activity: Inactive (12/14/2023)   Exercise Vital Sign    Days of Exercise per Week: 0 days    Minutes of Exercise per Session: 0 min  Stress: No Stress Concern Present (12/14/2023)   [employer] of Occupational Health - Occupational Stress Questionnaire    Feeling of Stress: Not at all  Social Connections: Moderately Isolated (12/14/2023)   Social Connection and Isolation Panel    Frequency of Communication with Friends and Family: Twice a week    Frequency of Social Gatherings with Friends and Family: Once a week    Attends Religious Services: 1 to 4 times  per year    Active Member of Clubs or Organizations: No    Attends Banker Meetings: Never    Marital Status: Never married  Intimate Partner Violence: Not At Risk (12/14/2023)   Humiliation, Afraid, Rape, and Kick questionnaire    Fear of Current or Ex-Partner: No    Emotionally Abused: No    Physically Abused: No    Sexually Abused: No    Review of Systems: Positive for *** All other review of systems negative except as mentioned in the HPI.  Physical Exam: Vital signs Wt 99.8 kg   BMI 31.57 kg/m   General:   Alert,  Well-developed, well-nourished, pleasant and cooperative in NAD Lungs:  Clear throughout to auscultation.   Heart:  Regular rate and rhythm; no murmurs, clicks, rubs,  or gallops. Abdomen:  Soft, nontender and nondistended. Normal bowel sounds.   Neuro/Psych:  Alert and cooperative. Normal mood and affect. A and O x 3   @Undray Allman  CHARLENA Commander, MD, [facility] [phone] (pager) 03/15/2024 8:45 PM@

## 2024-03-15 NOTE — Anesthesia Preprocedure Evaluation (Signed)
 Anesthesia Evaluation  Patient identified by MRN, date of birth, ID band Patient awake    Reviewed: Allergy & Precautions, NPO status , Patient's Chart, lab work & pertinent test results  Airway Mallampati: III  TM Distance: >3 FB Neck ROM: Full    Dental  (+) Teeth Intact, Dental Advisory Given   Pulmonary sleep apnea , Patient abstained from smoking.   breath sounds clear to auscultation       Cardiovascular hypertension, Pt. on medications and Pt. on home beta blockers + CAD and + Peripheral Vascular Disease   Rhythm:Regular Rate:Normal     Neuro/Psych negative neurological ROS  negative psych ROS   GI/Hepatic Neg liver ROS, PUD,GERD  Medicated,,  Endo/Other  diabetes, Type 2, Oral Hypoglycemic AgentsHypothyroidism    Renal/GU Renal disease     Musculoskeletal  (+) Arthritis ,    Abdominal   Peds  Hematology  (+) Blood dyscrasia, anemia   Anesthesia Other Findings   Reproductive/Obstetrics                              Anesthesia Physical Anesthesia Plan  ASA: 3  Anesthesia Plan: MAC   Post-op Pain Management: Minimal or no pain anticipated   Induction: Intravenous  PONV Risk Score and Plan: 0 and Propofol  infusion  Airway Management Planned: Natural Airway and Nasal Cannula  Additional Equipment: None  Intra-op Plan:   Post-operative Plan:   Informed Consent: I have reviewed the patients History and Physical, chart, labs and discussed the procedure including the risks, benefits and alternatives for the proposed anesthesia with the patient or authorized representative who has indicated his/her understanding and acceptance.       Plan Discussed with: CRNA  Anesthesia Plan Comments:          Anesthesia Quick Evaluation

## 2024-03-16 ENCOUNTER — Encounter (HOSPITAL_COMMUNITY): Admitting: Certified Registered"

## 2024-03-16 ENCOUNTER — Other Ambulatory Visit: Payer: Self-pay

## 2024-03-16 ENCOUNTER — Ambulatory Visit (HOSPITAL_COMMUNITY)
Admission: RE | Admit: 2024-03-16 | Discharge: 2024-03-16 | Disposition: A | Discharge status: Home / Self Care | Attending: Internal Medicine | Admitting: Internal Medicine

## 2024-03-16 ENCOUNTER — Encounter (HOSPITAL_COMMUNITY): Payer: Self-pay | Admitting: Internal Medicine

## 2024-03-16 ENCOUNTER — Encounter (HOSPITAL_COMMUNITY): Admission: RE | Disposition: A | Payer: Self-pay | Source: Home / Self Care | Attending: Internal Medicine

## 2024-03-16 ENCOUNTER — Ambulatory Visit (HOSPITAL_COMMUNITY): Admitting: Certified Registered"

## 2024-03-16 DIAGNOSIS — Z860100 Personal history of colon polyps, unspecified: Secondary | ICD-10-CM | POA: Diagnosis not present

## 2024-03-16 DIAGNOSIS — K219 Gastro-esophageal reflux disease without esophagitis: Secondary | ICD-10-CM | POA: Insufficient documentation

## 2024-03-16 DIAGNOSIS — D120 Benign neoplasm of cecum: Secondary | ICD-10-CM | POA: Insufficient documentation

## 2024-03-16 DIAGNOSIS — I739 Peripheral vascular disease, unspecified: Secondary | ICD-10-CM | POA: Diagnosis not present

## 2024-03-16 DIAGNOSIS — I2510 Atherosclerotic heart disease of native coronary artery without angina pectoris: Secondary | ICD-10-CM

## 2024-03-16 DIAGNOSIS — K2270 Barrett's esophagus without dysplasia: Secondary | ICD-10-CM | POA: Diagnosis not present

## 2024-03-16 DIAGNOSIS — Z8719 Personal history of other diseases of the digestive system: Secondary | ICD-10-CM

## 2024-03-16 DIAGNOSIS — I10 Essential (primary) hypertension: Secondary | ICD-10-CM

## 2024-03-16 DIAGNOSIS — K449 Diaphragmatic hernia without obstruction or gangrene: Secondary | ICD-10-CM | POA: Insufficient documentation

## 2024-03-16 DIAGNOSIS — R195 Other fecal abnormalities: Secondary | ICD-10-CM

## 2024-03-16 DIAGNOSIS — K31A19 Gastric intestinal metaplasia without dysplasia, unspecified site: Secondary | ICD-10-CM | POA: Diagnosis not present

## 2024-03-16 DIAGNOSIS — G4733 Obstructive sleep apnea (adult) (pediatric): Secondary | ICD-10-CM | POA: Insufficient documentation

## 2024-03-16 DIAGNOSIS — K5730 Diverticulosis of large intestine without perforation or abscess without bleeding: Secondary | ICD-10-CM | POA: Diagnosis not present

## 2024-03-16 DIAGNOSIS — F17200 Nicotine dependence, unspecified, uncomplicated: Secondary | ICD-10-CM | POA: Diagnosis not present

## 2024-03-16 DIAGNOSIS — Z7984 Long term (current) use of oral hypoglycemic drugs: Secondary | ICD-10-CM | POA: Insufficient documentation

## 2024-03-16 DIAGNOSIS — E119 Type 2 diabetes mellitus without complications: Secondary | ICD-10-CM | POA: Insufficient documentation

## 2024-03-16 DIAGNOSIS — D122 Benign neoplasm of ascending colon: Secondary | ICD-10-CM | POA: Insufficient documentation

## 2024-03-16 DIAGNOSIS — Z860101 Personal history of adenomatous and serrated colon polyps: Secondary | ICD-10-CM

## 2024-03-16 DIAGNOSIS — K635 Polyp of colon: Secondary | ICD-10-CM | POA: Diagnosis not present

## 2024-03-16 DIAGNOSIS — E039 Hypothyroidism, unspecified: Secondary | ICD-10-CM | POA: Insufficient documentation

## 2024-03-16 DIAGNOSIS — Z09 Encounter for follow-up examination after completed treatment for conditions other than malignant neoplasm: Secondary | ICD-10-CM | POA: Diagnosis not present

## 2024-03-16 DIAGNOSIS — Z1211 Encounter for screening for malignant neoplasm of colon: Secondary | ICD-10-CM | POA: Diagnosis present

## 2024-03-16 HISTORY — PX: ESOPHAGOGASTRODUODENOSCOPY: SHX5428

## 2024-03-16 HISTORY — PX: COLONOSCOPY: SHX5424

## 2024-03-16 HISTORY — PX: BIOPSY OF SKIN SUBCUTANEOUS TISSUE AND/OR MUCOUS MEMBRANE: SHX6741

## 2024-03-16 HISTORY — PX: POLYPECTOMY: SHX149

## 2024-03-16 LAB — GLUCOSE, CAPILLARY: Glucose-Capillary: 115 mg/dL — ABNORMAL HIGH (ref 70–99)

## 2024-03-16 SURGERY — EGD (ESOPHAGOGASTRODUODENOSCOPY)
Anesthesia: Monitor Anesthesia Care

## 2024-03-16 MED ORDER — PROPOFOL 500 MG/50ML IV EMUL
INTRAVENOUS | Status: AC
  Filled 2024-03-16: qty 50

## 2024-03-16 MED ORDER — PROPOFOL 10 MG/ML IV BOLUS
INTRAVENOUS | Status: DC | PRN
  Administered 2024-03-16: 20 mg via INTRAVENOUS

## 2024-03-16 MED ORDER — PROPOFOL 1000 MG/100ML IV EMUL
INTRAVENOUS | Status: AC
  Filled 2024-03-16: qty 100

## 2024-03-16 MED ORDER — SODIUM CHLORIDE 0.9 % IV SOLN
INTRAVENOUS | Status: AC | PRN
  Administered 2024-03-16: 500 mL via INTRAMUSCULAR

## 2024-03-16 MED ORDER — PROPOFOL 500 MG/50ML IV EMUL
INTRAVENOUS | Status: DC | PRN
  Administered 2024-03-16: 150 ug/kg/min via INTRAVENOUS

## 2024-03-16 MED ORDER — PROPOFOL 10 MG/ML IV BOLUS
INTRAVENOUS | Status: AC
  Filled 2024-03-16: qty 20

## 2024-03-16 MED ORDER — LIDOCAINE 2% (20 MG/ML) 5 ML SYRINGE
INTRAMUSCULAR | Status: DC | PRN
  Administered 2024-03-16: 100 mg via INTRAVENOUS

## 2024-03-16 MED ORDER — SODIUM CHLORIDE 0.9 % IV SOLN: INTRAVENOUS | Status: DC

## 2024-03-16 MED ORDER — AMISULPRIDE (ANTIEMETIC) 5 MG/2ML IV SOLN: 10.0000 mg | Freq: Once | INTRAVENOUS | Status: DC | PRN

## 2024-03-16 MED ORDER — EPHEDRINE SULFATE-NACL 50-0.9 MG/10ML-% IV SOSY
PREFILLED_SYRINGE | INTRAVENOUS | Status: DC | PRN
Start: 2024-03-16 — End: 2024-03-16
  Administered 2024-03-16: 5 mg via INTRAVENOUS
  Administered 2024-03-16: 10 mg via INTRAVENOUS

## 2024-03-16 MED ORDER — ONDANSETRON HCL 4 MG/2ML IJ SOLN: 4.0000 mg | Freq: Once | INTRAMUSCULAR | Status: DC | PRN

## 2024-03-16 NOTE — Transfer of Care (Signed)
 Immediate Anesthesia Transfer of Care Note  Patient: ABDUL BEIRNE  Procedure(s) Performed: EGD (ESOPHAGOGASTRODUODENOSCOPY) COLONOSCOPY BIOPSY, SKIN, SUBCUTANEOUS TISSUE, OR MUCOUS MEMBRANE POLYPECTOMY, INTESTINE  Patient Location: PACU and Endoscopy Unit  Anesthesia Type:MAC  Level of Consciousness: awake, drowsy, and patient cooperative  Airway & Oxygen Therapy: Patient Spontanous Breathing and Patient connected to face mask oxygen  Post-op Assessment: Report given to RN and Post -op Vital signs reviewed and stable  Post vital signs: Reviewed and stable  Last Vitals:  Vitals Value Taken Time  BP 114/38   Temp    Pulse 66 03/16/24 08:18  Resp 19 03/16/24 08:18  SpO2 98 % 03/16/24 08:18  Vitals shown include unfiled device data.  Last Pain:  Vitals:   03/16/24 0653  TempSrc: Temporal  PainSc: 0-No pain         Complications: No notable events documented.

## 2024-03-16 NOTE — Op Note (Addendum)
 [facility] Patient Name: Christopher Wade Procedure Date: 03/16/2024 MRN: [id number] Attending MD: Lupita FORBES Commander , MD, [id number] Date of Birth: Mar 02, 1951 CSN: [id number] Age: 73 Admit Type: Outpatient Procedure:                Upper GI endoscopy Indications:              Follow-up of Barrett's esophagus Providers:                Lupita CHARLENA Commander, MD, Haskel Chris, Technician,                            Gregoria Pierce, RN Referring MD:              Medicines:                Monitored Anesthesia Care Complications:            No immediate complications. Estimated Blood Loss:     Estimated blood loss was minimal. Procedure:                Pre-Anesthesia Assessment:                           - Prior to the procedure, a History and Physical                            was performed, and patient medications and                            allergies were reviewed. The patient's tolerance of                            previous anesthesia was also reviewed. The risks                            and benefits of the procedure and the sedation                            options and risks were discussed with the patient.                            All questions were answered, and informed consent                            was obtained. Prior Anticoagulants: The patient has                            taken Coumadin  (warfarin), last dose was 5 days                            prior to procedure. ASA Grade Assessment: III - A                            patient with severe systemic disease. After  reviewing the risks and benefits, the patient was                            deemed in satisfactory condition to undergo the                            procedure.                           After obtaining informed consent, the endoscope was                            passed under direct vision. Throughout the                            procedure, the patient's  blood pressure, pulse, and                            oxygen saturations were monitored continuously. The                            [id number]-H190 ([id number]) Olympus endoscope was introduced                            through the mouth, and advanced to the second part                            of duodenum. The upper GI endoscopy was                            accomplished without difficulty. The patient                            tolerated the procedure well. Scope In: Scope Out: Findings:      The esophagus and gastroesophageal junction were examined with white       light and narrow band imaging (NBI) from a forward view and retroflexed       position. There were esophageal mucosal changes secondary to established       short-segment Barrett's disease, classified as Barrett's stage C0-M2 per       [place] criteria. These changes involved the mucosa along an irregular       Z-line (38 cm from the incisors). One tongue of salmon-colored mucosa       was present from 39 to 40 cm and scattered islands of salmon-colored       mucosa were present from 38 to 39 cm. The maximum longitudinal extent of       these esophageal mucosal changes was 2 cm in length. Mucosa was biopsied       with a cold forceps for histology from 38 to 40 cm from the incisors.       One specimen bottle was sent to pathology. Verification of patient       identification for the specimen was done. Estimated blood loss was       minimal.      A 3 cm hiatal hernia was present.  The gastroesophageal flap valve was visualized endoscopically and       classified as Hill Grade IV (no fold, wide open lumen, hiatal hernia       present).      The exam was otherwise without abnormality.      The cardia and gastric fundus were otherwise normal on retroflexion. Impression:               - Esophageal mucosal changes secondary to                            established short-segment Barrett's disease,                             classified as Barrett's stage C0-M2 per [place]                            criteria. Biopsied.                           - 3 cm hiatal hernia.                           - Gastroesophageal flap valve classified as Hill                            Grade IV (no fold, wide open lumen, hiatal hernia                            present).                           - The examination was otherwise normal. Procedure                            performed at hospital due to difficult airway                            history. Moderate Sedation:      Not Applicable - Patient had care per Anesthesia. Recommendation:           - Patient has a contact number available for                            emergencies. The signs and symptoms of potential                            delayed complications were discussed with the                            patient. Return to normal activities tomorrow.                            Written discharge instructions were provided to the                            patient.                           -  Resume previous diet.                           - Continue present medications.                           - Await pathology results.                           - See the other procedure note for documentation of                            additional recommendations. Procedure Code(s):        --- Professional ---                           [id number], Esophagogastroduodenoscopy, flexible,                            transoral; with biopsy, single or multiple Diagnosis Code(s):        --- Professional ---                           K22.70, Barrett's esophagus without dysplasia                           K44.9, Diaphragmatic hernia without obstruction or                            gangrene CPT copyright 2022 American Medical Association. All rights reserved. The codes documented in this report are preliminary and upon coder review may  be revised to meet current compliance  requirements. Lupita FORBES Commander, MD 03/16/2024 8:27:26 AM This report has been signed electronically. Number of Addenda: 0

## 2024-03-16 NOTE — Discharge Instructions (Addendum)
 There was nothing suspicious seen on the Barrett's esophagus.  I took biopsies which is protocol to check for changes in this precancerous condition.  Remember, it is very rare for this to ever become cancer.  I will let you know those results and when to repeat an exam.  You also have a hiatal hernia meaning your stomach moves into your chest somewhat, a very common finding.  Please read the handouts.   Colonoscopy revealed 2 colon polyps that were removed that both look benign, and you also have diverticulosis. I will let you know pathology results and when to have another routine colonoscopy by mail and/or My Chart.   Please resume warfarin and follow the schedule you were given by the anticoagulation clinic.   I appreciate the opportunity to care for you. Lupita CHARLENA Commander, MD, FACG  YOU HAD AN ENDOSCOPIC PROCEDURE TODAY: Refer to the procedure report and other information in the discharge instructions given to you for any specific questions about what was found during the examination. If this information does not answer your questions, please call Dr. Darilyn office at [phone] to clarify.   YOU SHOULD EXPECT: Some feelings of bloating in the abdomen. Passage of more gas than usual. Walking can help get rid of the air that was put into your GI tract during the procedure and reduce the bloating. If you had a lower endoscopy (such as a colonoscopy or flexible sigmoidoscopy) you may notice spotting of blood in your stool or on the toilet paper. Some abdominal soreness may be present for a day or two, also.  DIET: Your first meal following the procedure should be a light meal and then it is ok to progress to your normal diet. A half-sandwich or bowl of soup is an example of a good first meal. Heavy or fried foods are harder to digest and may make you feel nauseous or bloated. Drink plenty of fluids but you should avoid alcoholic beverages for 24 hours.   ACTIVITY: Your care partner should  take you home directly after the procedure. You should plan to take it easy, moving slowly for the rest of the day. You can resume normal activity the day after the procedure however YOU SHOULD NOT DRIVE, use power tools, machinery or perform tasks that involve climbing or major physical exertion for 24 hours (because of the sedation medicines used during the test).   SYMPTOMS TO REPORT IMMEDIATELY: A gastroenterologist can be reached at any hour. Please call [phone]  for any of the following symptoms:  Following lower endoscopy (colonoscopy, flexible sigmoidoscopy) Excessive amounts of blood in the stool  Significant tenderness, worsening of abdominal pains  Swelling of the abdomen that is new, acute  Fever of 100 or higher  Following upper endoscopy (EGD, EUS, ERCP, esophageal dilation) Vomiting of blood or coffee ground material  New, significant abdominal pain  New, significant chest pain or pain under the shoulder blades  Painful or persistently difficult swallowing  New shortness of breath  Black, tarry-looking or red, bloody stools  FOLLOW UP:  If any biopsies were taken you will be contacted by phone or by letter within the next 1-3 weeks. Call [phone]  if you have not heard about the biopsies in 3 weeks.  Please also call with any specific questions about appointments or follow up tests.

## 2024-03-16 NOTE — Op Note (Signed)
 [facility] Patient Name: Christopher Wade Procedure Date: 03/16/2024 MRN: [id number] Attending MD: Lupita FORBES Commander , MD, [id number] Date of Birth: 1951-02-11 CSN: [id number] Age: 73 Admit Type: Outpatient Procedure:                Colonoscopy Indications:              High risk colon cancer surveillance: Personal                            history of colonic polyps, Last colonoscopy: 2019 Providers:                Lupita CHARLENA Commander, MD, Haskel Chris, Technician,                            Gregoria Pierce, RN Referring MD:              Medicines:                Monitored Anesthesia Care Complications:            No immediate complications. Estimated Blood Loss:     Estimated blood loss was minimal. Procedure:                Pre-Anesthesia Assessment:                           - Prior to the procedure, a History and Physical                            was performed, and patient medications and                            allergies were reviewed. The patient's tolerance of                            previous anesthesia was also reviewed. The risks                            and benefits of the procedure and the sedation                            options and risks were discussed with the patient.                            All questions were answered, and informed consent                            was obtained. Prior Anticoagulants: The patient                            last took Coumadin  (warfarin) 5 days prior to the                            procedure. ASA Grade Assessment: III - A patient  with severe systemic disease. After reviewing the                            risks and benefits, the patient was deemed in                            satisfactory condition to undergo the procedure.                           After obtaining informed consent, the colonoscope                            was passed under direct vision. Throughout the                             procedure, the patient's blood pressure, pulse, and                            oxygen saturations were monitored continuously. The                            [id number]-HQ190L ([id number] Olympus colonoscope was                            introduced through the anus and advanced to the the                            cecum, identified by appendiceal orifice and                            ileocecal valve. The colonoscopy was performed                            without difficulty. The patient tolerated the                            procedure well. The quality of the bowel                            preparation was adequate. The ileocecal valve,                            appendiceal orifice, and rectum were photographed.                            The bowel preparation used was SUPREP via split                            dose instruction. Scope In: 7:53:35 AM Scope Out: 8:09:40 AM Scope Withdrawal Time: 0 hours 6 minutes 44 seconds  Total Procedure Duration: 0 hours 16 minutes 5 seconds  Findings:      The perianal and digital rectal examinations were normal.      Two sessile polyps were found in the ascending colon and ileocecal  valve. The polyps were 6 to 9 mm in size. These polyps were removed with       a cold snare. Resection and retrieval were complete. Verification of       patient identification for the specimen was done. Estimated blood loss       was minimal.      Multiple diverticula were found in the sigmoid colon and ascending colon.      The exam was otherwise without abnormality on direct and retroflexion       views. Impression:               - Two 6 to 9 mm polyps in the ascending colon and                            at the ileocecal valve, removed with a cold snare.                            Resected and retrieved.                           - Diverticulosis in the sigmoid colon and in the                            ascending colon.                            - The examination was otherwise normal on direct                            and retroflexion views.                           - Personal history of colonic polyps. 03/2008 2                            adenomas 1 hyperplastic                           09/2017 1 ssp                           Procedure performed at hospital due to                            difficultairway history. Moderate Sedation:      Not Applicable - Patient had care per Anesthesia. Recommendation:           - Patient has a contact number available for                            emergencies. The signs and symptoms of potential                            delayed complications were discussed with the                            patient. Return to  normal activities tomorrow.                            Written discharge instructions were provided to the                            patient.                           - Resume previous diet.                           - Continue present medications.                           - Resume Coumadin  (warfarin) at prior dose                            tomorrow. Refer to Coumadin  Clinic for further                            adjustment of therapy.                           - See the other procedure note for documentation of                            additional recommendations.                           - Repeat colonoscopy is recommended for                            surveillance. The colonoscopy date will be                            determined after pathology results from today's                            exam become available for review. Procedure Code(s):        --- Professional ---                           [phone], Colonoscopy, flexible; with removal of                            tumor(s), polyp(s), or other lesion(s) by snare                            technique Diagnosis Code(s):        --- Professional ---                           Z86.010, Personal history of colonic  polyps                           D12.2, Benign neoplasm of ascending colon  D12.0, Benign neoplasm of cecum                           K57.30, Diverticulosis of large intestine without                            perforation or abscess without bleeding CPT copyright 2022 American Medical Association. All rights reserved. The codes documented in this report are preliminary and upon coder review may  be revised to meet current compliance requirements. Lupita FORBES Commander, MD 03/16/2024 8:33:23 AM This report has been signed electronically. Number of Addenda: 0

## 2024-03-16 NOTE — Anesthesia Postprocedure Evaluation (Signed)
 Anesthesia Post Note  Patient: Christopher Wade  Procedure(s) Performed: EGD (ESOPHAGOGASTRODUODENOSCOPY) COLONOSCOPY BIOPSY, SKIN, SUBCUTANEOUS TISSUE, OR MUCOUS MEMBRANE POLYPECTOMY, INTESTINE     Patient location during evaluation: PACU Anesthesia Type: MAC Level of consciousness: awake and alert Pain management: pain level controlled Vital Signs Assessment: post-procedure vital signs reviewed and stable Respiratory status: spontaneous breathing, nonlabored ventilation, respiratory function stable and patient connected to nasal cannula oxygen Cardiovascular status: stable and blood pressure returned to baseline Postop Assessment: no apparent nausea or vomiting Anesthetic complications: no   No notable events documented.  Last Vitals:  Vitals:   03/16/24 0830 03/16/24 0840  BP: (!) 161/55   Pulse: 72 69  Resp: 20 13  Temp:    SpO2: 96% 95%    Last Pain:  Vitals:   03/16/24 0830  TempSrc:   PainSc: 7                  [name]

## 2024-03-16 NOTE — Anesthesia Procedure Notes (Signed)
 Procedure Name: MAC Date/Time: 03/16/2024 7:42 AM  Performed by: Metta Andrea NOVAK, CRNAPre-anesthesia Checklist: Patient identified, Emergency Drugs available, Suction available, Patient being monitored and Timeout performed Oxygen Delivery Method: Simple face mask Placement Confirmation: positive ETCO2

## 2024-03-17 ENCOUNTER — Encounter (HOSPITAL_COMMUNITY): Payer: Self-pay | Admitting: Internal Medicine

## 2024-03-17 LAB — SURGICAL PATHOLOGY

## 2024-03-23 ENCOUNTER — Encounter

## 2024-03-23 ENCOUNTER — Ambulatory Visit: Attending: Student in an Organized Health Care Education/Training Program

## 2024-03-23 DIAGNOSIS — I4819 Other persistent atrial fibrillation: Secondary | ICD-10-CM | POA: Diagnosis not present

## 2024-03-23 DIAGNOSIS — Z5181 Encounter for therapeutic drug level monitoring: Secondary | ICD-10-CM | POA: Diagnosis not present

## 2024-03-23 LAB — POCT INR: INR: 2.1 (ref 2.0–3.0)

## 2024-03-23 LAB — OPHTHALMOLOGY REPORT-SCANNED

## 2024-03-23 NOTE — Patient Instructions (Signed)
 Continue taking warfarin 1.5 tablets daily except 1 tablet on Monday, Thursday, and Saturday. Stay consistent with greens each week (1-2 times per week). Recheck INR in 6 weeks. Coumadin  Clinic: [phone] Fax number [phone]

## 2024-03-23 NOTE — Progress Notes (Signed)
 INR 2.1 Please see anticoagulation encounter Continue taking warfarin 1.5 tablets daily except 1 tablet on Monday, Thursday, and Saturday. Stay consistent with greens each week (1-2 times per week). Recheck INR in 6 weeks. Coumadin  Clinic: [phone] Fax number [phone]

## 2024-03-25 ENCOUNTER — Other Ambulatory Visit: Payer: Self-pay | Admitting: Family Medicine

## 2024-03-25 DIAGNOSIS — I10 Essential (primary) hypertension: Secondary | ICD-10-CM

## 2024-03-30 NOTE — Telephone Encounter (Signed)
 Call to patient to advise Dr. Shlomo would like him to repeat lipid labs in 3 months. Patient verbalizes understanding and agrees to plan. Orders placed.

## 2024-03-30 NOTE — Telephone Encounter (Signed)
-----   Message from [name] sent at 03/15/2024  4:14 PM EDT ----- Geni please have him repeat fasting lipid panel in 3 months ----- Message ----- From: Billy Philippe SAUNDERS, NP Sent: 03/15/2024  10:55 AM EDT To: Wilbert SAUNDERS Bihari, MD; Will Gladis Norton, MD;#  Kidney function is slightly decreased, recommend to stay hydrated. Some of this could be from prepping for your procedure tomorrow.  Triglycerides are elevated, recommend to decrease carbohydrates (pastas, rice, potatoes, and breads) and sweets. Good (HDL) cholesterol is not at goal, recommend to add fiber and antioxidants to your  diet.  A1c has improved. TSH is at goal. No gout flare up based on uric acid levels being normal. Continue all medications as prescribed. I am sending a copy of your labs to your care team.  ----- Message ----- From: Interface, [facility] Sent: 03/14/2024   4:40 PM EDT To: Philippe SAUNDERS Billy, NP

## 2024-04-04 ENCOUNTER — Encounter: Payer: Self-pay | Admitting: Podiatry

## 2024-04-04 ENCOUNTER — Ambulatory Visit: Admitting: Podiatry

## 2024-04-04 DIAGNOSIS — E1142 Type 2 diabetes mellitus with diabetic polyneuropathy: Secondary | ICD-10-CM

## 2024-04-04 DIAGNOSIS — M79674 Pain in right toe(s): Secondary | ICD-10-CM

## 2024-04-04 DIAGNOSIS — M79675 Pain in left toe(s): Secondary | ICD-10-CM | POA: Diagnosis not present

## 2024-04-04 DIAGNOSIS — B351 Tinea unguium: Secondary | ICD-10-CM | POA: Diagnosis not present

## 2024-04-04 NOTE — Progress Notes (Signed)
 This patient returns to my office for at risk foot care.  This patient requires this care by a professional since this patient will be at risk due to having chronic anticoagulation, type 2 diabetes, CKD and venous stasis  This patient is unable to cut nails himself since the patient cannot reach his nails.These nails are painful walking and wearing shoes. Patient has severe swelling. This patient presents for at risk foot care today.  General Appearance  Alert, conversant and in no acute stress.  Vascular  Dorsalis pedis and posterior tibial  pulses are not palpable due to swelling.  bilaterally.  Capillary return is within normal limits  bilaterally. Temperature is within normal limits  bilaterally.  Neurologic  Senn-Weinstein monofilament wire test within normal limits  bilaterally. Muscle power within normal limits bilaterally.  Nails Thick disfigured discolored nails with subungual debris  from hallux to fifth toes bilaterally. No evidence of bacterial infection or drainage bilaterally.  Orthopedic  No limitations of motion  feet .  No crepitus or effusions noted.  No bony pathology or digital deformities noted.  Skin  normotropic skin with no porokeratosis noted bilaterally.  No signs of infections or ulcers noted.     Onychomycosis  Pain in right toes  Pain in left toes  Consent was obtained for treatment procedures.   Mechanical debridement of nails 1-5  bilaterally performed with a nail nipper.  Filed with dremel without incident. Patient to set up an appointment with Dr.  Tobie for his swelling.   Return office visit    3 months.                   Told patient to return for periodic foot care and evaluation due to potential at risk complications.   Cordella Bold DPM

## 2024-04-05 ENCOUNTER — Telehealth: Payer: Self-pay | Admitting: Internal Medicine

## 2024-04-05 ENCOUNTER — Ambulatory Visit: Payer: Self-pay | Admitting: Internal Medicine

## 2024-04-05 ENCOUNTER — Other Ambulatory Visit: Payer: Self-pay | Admitting: Cardiology

## 2024-04-05 DIAGNOSIS — I4819 Other persistent atrial fibrillation: Secondary | ICD-10-CM

## 2024-04-05 NOTE — Telephone Encounter (Signed)
 Patient report a continuation of his diarrhea symptoms. He understood that some of his medications could be responsible for this. Asks if any of these medications could be replaced and what did you think about his pathology results?

## 2024-04-05 NOTE — Telephone Encounter (Signed)
 Inbound call from patient requesting a call back to discuss results from procedure on 10/23 with Dr. Avram. Please advise.

## 2024-04-05 NOTE — Telephone Encounter (Signed)
 Please let him know I am sorry this took so long (have been busy with past week at hospital)  1) esophagus biopsies show Barrett's esophagus - pre-cancerous changes as before - stay on pantoprazole   2) colon polyps benign adenomas - pre-cancerous, as before  3) Office visit recall  in 5 years to review if it makes sense to repeat procedures (earliest I would do routine repeat and age and health status will impact decision)  4) He described persistent intermittent diarrhea problems in recovery after procedures   5) Please double check about timing of onset of diarrhea - I think it began this year so would not think related to medications metformin , olmesartan , pantoprazole ) as has been on x years   6) Try taking 1-2 Imodium every day - and if does not move bowels x 2 days then hold the dose  7) If this fails to help adequately then he should call back and we will figure something else out

## 2024-04-06 NOTE — Telephone Encounter (Signed)
 Spoke with the patient. Reviewed recommendations. Confirmed the diarrhea has been long term or at least for the past year. He will try daily Imodium 1 to 2 tablets. Follow up with his progress in 1 week. Recall entered for office visit in 5 years.

## 2024-04-13 NOTE — Telephone Encounter (Signed)
 Spoke with the patient. He is having 2 manageable bowel movements daily. He takes one Imodium in the morning and repeats this at 2 pm. He agrees to continue as previously discussed.

## 2024-04-14 ENCOUNTER — Ambulatory Visit: Admitting: Podiatry

## 2024-04-14 DIAGNOSIS — M65971 Unspecified synovitis and tenosynovitis, right ankle and foot: Secondary | ICD-10-CM | POA: Diagnosis not present

## 2024-04-14 DIAGNOSIS — M65972 Unspecified synovitis and tenosynovitis, left ankle and foot: Secondary | ICD-10-CM

## 2024-04-14 MED ORDER — COLCHICINE 0.6 MG PO TABS: 0.6000 mg | ORAL_TABLET | Freq: Every day | ORAL | 0 refills | Status: DC

## 2024-04-14 NOTE — Progress Notes (Unsigned)
 Subjective:  Patient ID: Christopher Wade, male    DOB: Jun 22, 1950,  MRN: [id number]  Chief Complaint  Patient presents with   Foot Pain    Bilateral foot swelling     73 y.o. male presents with the above complaint.  Patient presents with bilateral ankle pain that has been for quite some time is progressing and worse worse with ambulation and shoe pressure is causing some swelling.  Hurts with ambulation he would like to do a steroid injection denies any other acute complaints.  He is being conservatively treated with Dr. Loreda   Review of Systems: Negative except as noted in the HPI. Denies N/V/F/Ch.  Past Medical History:  Diagnosis Date   AKI (acute kidney injury) 05/27/2020   Arthritis    joints get stiff (06/11/2016)   Bee sting allergy    Bee sting allergy    CAD (coronary artery disease), native coronary artery 02/12/2016   cath showing 50% LM, 40% distal LAD, 70% ost to prox LCs, 40% mid LAD and 95% mid to dist RCA s/p PCI with DES x 2 and stage PCI of the LCx/OM.   Carotid artery stenosis    1-39% bilateral carotid stenosis by dopples 06/2022   Difficult airway 11/30/2023   Duodenal ulcer    History of gout    Hx of adenomatous colonic polyps 04/10/2008   Hyperlipidemia    Hypertension    Hypothyroidism    OSA (obstructive sleep apnea)    tried CPAP; couldn't do it (06/11/2016)   Persistent atrial fibrillation (HCC)    s/p DCCV 03/2015 with reversion back to atrial fibrillation, loaded with Amio and s/p DCCV to NSR 11/2015.  He is now s/p afib albation.  He had amio lung toxicity and amio was stopped.   Subclavian artery stenosis    right subclavian artery stenosis by dopplers 2/24   Thyroid  disease    Type II diabetes mellitus (HCC)    Vitamin D  deficiency     Current Outpatient Medications:    colchicine  0.6 MG tablet, Take 1 tablet (0.6 mg total) by mouth daily., Disp: 60 tablet, Rfl: 0   acetaminophen  (TYLENOL ) 325 MG tablet, Take 325 mg by mouth as needed for  mild pain., Disp: , Rfl:    allopurinol  (ZYLOPRIM ) 300 MG tablet, Take 1 tablet (300 mg total) by mouth daily., Disp: 90 tablet, Rfl: 0   amLODipine  (NORVASC ) 10 MG tablet, TAKE 1 TABLET BY MOUTH EVERY DAY FOR BLOOD PRESSURE AND HEART, Disp: 90 tablet, Rfl: 1   Ascorbic Acid (VITAMIN C WITH ROSE HIPS) 500 MG tablet, Take 500 mg by mouth daily., Disp: , Rfl:    atorvastatin  (LIPITOR ) 80 MG tablet, TAKE 1/2 TABLET BY MOUTH DAILY, Disp: 30 tablet, Rfl: 3   B Complex Vitamins (B COMPLEX PO), Take 1 tablet by mouth daily., Disp: , Rfl:    carvedilol  (COREG ) 12.5 MG tablet, TAKE 1 TABLET (12.5MG  TOTAL) BY MOUTH TWICE A DAY WITH MEALS, Disp: 180 tablet, Rfl: 0   cholecalciferol  (VITAMIN D ) 1000 UNITS tablet, Take 1,000 Units by mouth daily., Disp: , Rfl:    colchicine  0.6 MG tablet, take 2 tablet daily now with start of a flare up, take 1 tablet an hour later, Disp: 3 tablet, Rfl: 3   Ferrous Sulfate (IRON ) 325 (65 Fe) MG TABS, Take 1 tablet (325 mg total) by mouth every other day., Disp: 30 tablet, Rfl: 0   Fluticasone-Umeclidin-Vilant (TRELEGY ELLIPTA ) 100-62.5-25 MCG/ACT AEPB, Inhale 100 mcg into the lungs  daily., Disp: 60 each, Rfl: 6   furosemide  (LASIX ) 40 MG tablet, TAKE 1 TABLET BY MOUTH 1 TO 2 TIMES PER DAY AS NEEDED FOR FLUID RETENTION OR ANKLE SWELLING, Disp: 180 tablet, Rfl: 1   levothyroxine  (SYNTHROID ) 100 MCG tablet, Take  1 tablet  Daily  on an empty stomach with only water for 30 minutes & no Antacid meds, Calcium  or Magnesium  for 4 hours & avoid Biotin                                                                                 /                                         TAKE                                                     BY                                                  MOUTH, Disp: 90 tablet, Rfl: 3   loratadine  (CLARITIN ) 10 MG tablet, Take 10 mg by mouth daily., Disp: , Rfl:    metFORMIN  (GLUCOPHAGE -XR) 500 MG 24 hr tablet, Take 1 tablets in the morning and 2 tablets in the  evening., Disp: 360 tablet, Rfl: 1   nystatin  cream (MYCOSTATIN ), Apply 1 Application topically 2 (two) times daily., Disp: , Rfl:    olmesartan  (BENICAR ) 40 MG tablet, TAKE 1 TABLET (40 MG TOTAL) BY MOUTH DAILY FOR BLOOD PRESSURE, Disp: 90 tablet, Rfl: 0   Omega-3 Fatty Acids (FISH OIL) 1200 MG CAPS, Take 1,200 mg by mouth daily., Disp: , Rfl:    pantoprazole  (PROTONIX ) 40 MG tablet, TAKE 1 TABLET BY MOUTH EVERY DAY BEFORE BREAKFAST, Disp: 90 tablet, Rfl: 3   potassium chloride  SA (KLOR-CON  M) 20 MEQ tablet, 2TABS BY MOUTH DAILY X1DAY,THEN 1 TAB AS NEED WHEN YOU TAKE LASIX  FOR FLUID RETENTION/ANKLE SWELLING, Disp: 90 tablet, Rfl: 2   warfarin (COUMADIN ) 5 MG tablet, TAKE 1 TABLET BY MOUTH DAILY. EXCEPT 1 AND 1/2 TABS ON MONDAYS OR AS DIRECTED BY COAGULATION CLINIC, Disp: 40 tablet, Rfl: 1   zinc gluconate 50 MG tablet, Take 50 mg by mouth daily., Disp: , Rfl:   Social History   Tobacco Use  Smoking Status Never  Smokeless Tobacco Never    Allergies  Allergen Reactions   Amiodarone  Other (See Comments)    Lung toxicity   Bee Venom Other (See Comments)    Leg went numb   Objective:  There were no vitals filed for this visit. There is no height or weight on file to calculate BMI. Constitutional Well developed. Well nourished.  Vascular Dorsalis pedis pulses palpable bilaterally. Posterior tibial pulses palpable bilaterally. Capillary refill normal to  all digits.  No cyanosis or clubbing noted. Pedal hair growth normal.  Neurologic Normal speech. Oriented to person, place, and time. Epicritic sensation to light touch grossly present bilaterally.  Dermatologic Nails well groomed and normal in appearance. No open wounds. No skin lesions.  Orthopedic: Pain on palpation bilateral ankle medial and lateral gutter.  Hurts with ambulation and shoe pressure deep intra-articular ankle pain noted no pain at the Achilles tendon peroneal tendon posterior tibial tendon   Radiographs:  None Assessment:   1. Synovitis of right ankle   2. Synovitis of left ankle    Plan:  Patient was evaluated and treated and all questions answered.  Bilateral ankle synovitis - All questions or concerns were discussed with the patient in extensive detail given the amount of pain that he is experiencing would benefit from steroid injection to both ankle.  I discussed this with the patient and he states understanding like to proceed with steroid injection -A steroid injection was performed at bilateral ankle using 1% plain Lidocaine  and 10 mg of Kenalog. This was well tolerated.   No follow-ups on file.

## 2024-04-29 ENCOUNTER — Other Ambulatory Visit: Payer: Self-pay | Admitting: Family Medicine

## 2024-04-29 DIAGNOSIS — E1122 Type 2 diabetes mellitus with diabetic chronic kidney disease: Secondary | ICD-10-CM

## 2024-05-02 ENCOUNTER — Emergency Department (HOSPITAL_BASED_OUTPATIENT_CLINIC_OR_DEPARTMENT_OTHER)
Admission: EM | Admit: 2024-05-02 | Discharge: 2024-05-02 | Disposition: A | Discharge status: Home / Self Care | Attending: Emergency Medicine | Admitting: Emergency Medicine

## 2024-05-02 ENCOUNTER — Other Ambulatory Visit: Payer: Self-pay

## 2024-05-02 ENCOUNTER — Ambulatory Visit: Attending: Cardiology | Admitting: Cardiology

## 2024-05-02 ENCOUNTER — Encounter: Payer: Self-pay | Admitting: Cardiology

## 2024-05-02 ENCOUNTER — Ambulatory Visit: Admitting: *Deleted

## 2024-05-02 VITALS — BP 134/70 | HR 61 | Ht 70.0 in | Wt 232.0 lb

## 2024-05-02 DIAGNOSIS — E785 Hyperlipidemia, unspecified: Secondary | ICD-10-CM

## 2024-05-02 DIAGNOSIS — R0602 Shortness of breath: Secondary | ICD-10-CM

## 2024-05-02 DIAGNOSIS — I2510 Atherosclerotic heart disease of native coronary artery without angina pectoris: Secondary | ICD-10-CM

## 2024-05-02 DIAGNOSIS — I6523 Occlusion and stenosis of bilateral carotid arteries: Secondary | ICD-10-CM

## 2024-05-02 DIAGNOSIS — I4819 Other persistent atrial fibrillation: Secondary | ICD-10-CM

## 2024-05-02 DIAGNOSIS — I10 Essential (primary) hypertension: Secondary | ICD-10-CM

## 2024-05-02 DIAGNOSIS — R600 Localized edema: Secondary | ICD-10-CM

## 2024-05-02 DIAGNOSIS — Z5181 Encounter for therapeutic drug level monitoring: Secondary | ICD-10-CM

## 2024-05-02 LAB — COMPREHENSIVE METABOLIC PANEL WITH GFR
ALT: 17 U/L (ref 0–44)
AST: 20 U/L (ref 15–41)
Albumin: 4.2 g/dL (ref 3.5–5.0)
Alkaline Phosphatase: 124 U/L (ref 38–126)
Anion gap: 13 (ref 5–15)
BUN: 16 mg/dL (ref 8–23)
CO2: 25 mmol/L (ref 22–32)
Calcium: 9.3 mg/dL (ref 8.9–10.3)
Chloride: 102 mmol/L (ref 98–111)
Creatinine, Ser: 1.29 mg/dL — ABNORMAL HIGH (ref 0.61–1.24)
GFR, Estimated: 58 mL/min — ABNORMAL LOW (ref >60)
Glucose, Bld: 120 mg/dL — ABNORMAL HIGH (ref 70–99)
Potassium: 4.2 mmol/L (ref 3.5–5.1)
Sodium: 139 mmol/L (ref 135–145)
Total Bilirubin: 0.6 mg/dL (ref 0.0–1.2)
Total Protein: 7.6 g/dL (ref 6.5–8.1)

## 2024-05-02 LAB — PROTIME-INR
INR: 2.7 — ABNORMAL HIGH (ref 0.8–1.2)
Prothrombin Time: 29.7 s — ABNORMAL HIGH (ref 11.4–15.2)

## 2024-05-02 LAB — CBC WITH DIFFERENTIAL/PLATELET
Abs Immature Granulocytes: 0.03 K/uL (ref 0.00–0.07)
Basophils Absolute: 0 K/uL (ref 0.0–0.1)
Basophils Relative: 1 %
Eosinophils Absolute: 0.2 K/uL (ref 0.0–0.5)
Eosinophils Relative: 2 %
HCT: 43.2 % (ref 39.0–52.0)
Hemoglobin: 14.8 g/dL (ref 13.0–17.0)
Immature Granulocytes: 0 %
Lymphocytes Relative: 15 %
Lymphs Abs: 1.3 K/uL (ref 0.7–4.0)
MCH: 35.1 pg — ABNORMAL HIGH (ref 26.0–34.0)
MCHC: 34.3 g/dL (ref 30.0–36.0)
MCV: 102.4 fL — ABNORMAL HIGH (ref 80.0–100.0)
Monocytes Absolute: 0.8 K/uL (ref 0.1–1.0)
Monocytes Relative: 9 %
Neutro Abs: 6.2 K/uL (ref 1.7–7.7)
Neutrophils Relative %: 73 %
Platelets: 228 K/uL (ref 150–400)
RBC: 4.22 MIL/uL (ref 4.22–5.81)
RDW: 14.8 % (ref 11.5–15.5)
WBC: 8.4 K/uL (ref 4.0–10.5)
nRBC: 0 % (ref 0.0–0.2)

## 2024-05-02 LAB — CBG MONITORING, ED: Glucose-Capillary: 119 mg/dL — ABNORMAL HIGH (ref 70–99)

## 2024-05-02 LAB — POCT INR: INR: 3.6 — AB (ref 2.0–3.0)

## 2024-05-02 LAB — PRO BRAIN NATRIURETIC PEPTIDE: Pro Brain Natriuretic Peptide: 294 pg/mL (ref <300.0)

## 2024-05-02 MED ORDER — FUROSEMIDE 10 MG/ML IJ SOLN
40.0000 mg | Freq: Once | INTRAMUSCULAR | Status: AC
  Administered 2024-05-02: 40 mg via INTRAVENOUS
  Filled 2024-05-02: qty 4

## 2024-05-02 MED ORDER — FUROSEMIDE 8 MG/ML PO SOLN: 40.0000 mg | Freq: Once | ORAL | Status: DC

## 2024-05-02 NOTE — Patient Instructions (Signed)
 Description   INR-3.6; Do not take any warfarin today then continue taking warfarin 1.5 tablets daily except 1 tablet on Monday, Thursday, and Saturday. Stay consistent with greens each week (1-2 times per week). Recheck INR in 4 weeks (normally 6 weeks). Coumadin  Clinic: [phone] Fax number [phone]

## 2024-05-02 NOTE — ED Notes (Signed)
Discharge instructions and follow up care reviewed and explained, pt verbalized understanding and had no further questions on d/c.

## 2024-05-02 NOTE — ED Notes (Signed)
 Called lab to run PT-INR on hold, spoke with [place].

## 2024-05-02 NOTE — Patient Instructions (Signed)
 Medication Instructions:  Your physician recommends that you continue on your current medications as directed. Please refer to the Current Medication list given to you today.  *If you need a refill on your cardiac medications before your next appointment, please call your pharmacy*  Lab Work: None.  If you have labs (blood work) drawn today and your tests are completely normal, you will receive your results only by: MyChart Message (if you have MyChart) OR A paper copy in the mail If you have any lab test that is abnormal or we need to change your treatment, we will call you to review the results.  Testing/Procedures: None.  Follow-Up: At [facility], you and your health needs are our priority.  As part of our continuing mission to provide you with exceptional heart care, our providers are all part of one team.  This team includes your primary Cardiologist (physician) and Advanced Practice Providers or APPs (Physician Assistants and Nurse Practitioners) who all work together to provide you with the care you need, when you need it.  Your next appointment:   Friday 06/03/23 at 10:55 AM  Provider:   Orren Fabry PA-C  We recommend signing up for the patient portal called MyChart.  Sign up information is provided on this After Visit Summary.  MyChart is used to connect with patients for Virtual Visits (Telemedicine).  Patients are able to view lab/test results, encounter notes, upcoming appointments, etc.  Non-urgent messages can be sent to your provider as well.   To learn more about what you can do with MyChart, go to [email or web address].   Other Instructions Dr. Shlomo has recommended you go across the street to the [facility] ER to have your leg swelling evaluated.

## 2024-05-02 NOTE — Progress Notes (Signed)
 Date:  05/02/2024   ID:  Christopher Wade, DOB Jul 29, 1950, MRN [id number]   PCP:  [name], NP  Cardiologist:  [name], MD  Electrophysiologist:  [name], MD   Chief Complaint:  Afib, HTN, CAD, HLD  History of Present Illness:    Christopher Wade is a 73 y.o. male  with a hx of persistent atrial fibrillation s/p  DCCV 03/2015 and again in July 2017 after Amio load. He ultimately underwent afib ablation 06/11/2016.  He was found to have early lung toxicity and his Amio was stopped. He also has a history of HTN, dyslipidemia, DM, ASCAD cath showing 50% LM, 40% distal LAD, 70% ost to prox LCX, 40% mid LAD and 95% mid to dist RCA s/p PCI with DES x 2 and stage PCI of the LCx/OM.    Patient is here today for follow-up and is doing well.  He does have chronic shortness of breath and lower extremity edema.  He is followed by pulmonary with Dr. Annella.  He has a history of orthostatic hypotension.  His shortness of breath and lower extremity edema are very stable.  He denies any chest pain or pressure, PND, orthopnea, dizziness, palpitations, syncope.  Prior CV studies:   The following studies were reviewed today:  Labs from PCP  Past Medical History:  Diagnosis Date   AKI (acute kidney injury) 05/27/2020   Arthritis    joints get stiff (06/11/2016)   Bee sting allergy    Bee sting allergy    CAD (coronary artery disease), native coronary artery 02/12/2016   cath showing 50% LM, 40% distal LAD, 70% ost to prox LCs, 40% mid LAD and 95% mid to dist RCA s/p PCI with DES x 2 and stage PCI of the LCx/OM.   Carotid artery stenosis    1-39% bilateral carotid stenosis by dopples 06/2022   Difficult airway 11/30/2023   Duodenal ulcer    History of gout    Hx of adenomatous colonic polyps 04/10/2008   Hyperlipidemia    Hypertension    Hypothyroidism    OSA (obstructive sleep apnea)    tried CPAP; couldn't do it (06/11/2016)   Persistent atrial fibrillation (HCC)     s/p DCCV 03/2015 with reversion back to atrial fibrillation, loaded with Amio and s/p DCCV to NSR 11/2015.  He is now s/p afib albation.  He had amio lung toxicity and amio was stopped.   Subclavian artery stenosis    right subclavian artery stenosis by dopplers 2/24   Thyroid  disease    Type II diabetes mellitus (HCC)    Vitamin D  deficiency    Past Surgical History:  Procedure Laterality Date   BIOPSY  05/28/2020   Procedure: BIOPSY;  Surgeon: Albertus Gordy HERO, MD;  Location: [facility] ENDOSCOPY;  Service: Gastroenterology;;   BIOPSY OF SKIN SUBCUTANEOUS TISSUE AND/OR MUCOUS MEMBRANE  03/16/2024   Procedure: BIOPSY, SKIN, SUBCUTANEOUS TISSUE, OR MUCOUS MEMBRANE;  Surgeon: Avram Lupita BRAVO, MD;  Location: [facility] ENDOSCOPY;  Service: Gastroenterology;;   CARDIAC CATHETERIZATION N/A 10/11/2015   Procedure: Left Heart Cath and Coronary Angiography;  Surgeon: Maude JAYSON Emmer, MD;  Location: [facility] INVASIVE CV LAB;  Service: Cardiovascular;  Laterality: N/A;   CARDIAC CATHETERIZATION N/A 10/11/2015   Procedure: Coronary Stent Intervention;  Surgeon: Peter M Jordan, MD;  Location: [facility] INVASIVE CV LAB;  Service: Cardiovascular;  Laterality: N/A;   CARDIAC CATHETERIZATION N/A 10/11/2015   Procedure: Intravascular Pressure Wire/FFR Study;  Surgeon: Peter M Jordan,  MD;  Location: [facility] INVASIVE CV LAB;  Service: Cardiovascular;  Laterality: N/A;   CARDIAC CATHETERIZATION N/A 10/14/2015   Procedure: Coronary Stent Intervention;  Surgeon: Candyce GORMAN Reek, MD;  Location: [facility] INVASIVE CV LAB;  Service: Cardiovascular;  Laterality: N/A;   CARDIOVERSION N/A 04/05/2015   Procedure: CARDIOVERSION;  Surgeon: Wilbert JONELLE Bihari, MD;  Location: [facility] ENDOSCOPY;  Service: Cardiovascular;  Laterality: N/A;   CARDIOVERSION N/A 12/09/2015   Procedure: CARDIOVERSION;  Surgeon: Wilbert JONELLE Bihari, MD;  Location: [facility] ENDOSCOPY;  Service: Cardiovascular;  Laterality: N/A;   CARDIOVERSION N/A 03/16/2016   Procedure: CARDIOVERSION;  Surgeon: [name] Scarce, MD;  Location: [facility] ENDOSCOPY;  Service: Cardiovascular;  Laterality: N/A;   CATARACT EXTRACTION Right 2019   Dr. Jens   COLONOSCOPY N/A 03/16/2024   Procedure: COLONOSCOPY;  Surgeon: Avram Lupita BRAVO, MD;  Location: [facility] ENDOSCOPY;  Service: Gastroenterology;  Laterality: N/A;   CORONARY ANGIOPLASTY     ELECTROPHYSIOLOGIC STUDY N/A 06/11/2016   Procedure: Atrial Fibrillation Ablation;  Surgeon: Will Gladis Norton, MD;  Location: [facility] INVASIVE CV LAB;  Service: Cardiovascular;  Laterality: N/A;   ESOPHAGOGASTRODUODENOSCOPY N/A 03/16/2024   Procedure: EGD (ESOPHAGOGASTRODUODENOSCOPY);  Surgeon: Avram Lupita BRAVO, MD;  Location: [facility] ENDOSCOPY;  Service: Gastroenterology;  Laterality: N/A;   ESOPHAGOGASTRODUODENOSCOPY (EGD) WITH PROPOFOL  N/A 05/28/2020   Procedure: ESOPHAGOGASTRODUODENOSCOPY (EGD) WITH PROPOFOL ;  Surgeon: Albertus Gordy HERO, MD;  Location: [facility] ENDOSCOPY;  Service: Gastroenterology;  Laterality: N/A;   POLYPECTOMY  03/16/2024   Procedure: POLYPECTOMY, INTESTINE;  Surgeon: Avram Lupita BRAVO, MD;  Location: [facility] ENDOSCOPY;  Service: Gastroenterology;;   TOTAL HIP ARTHROPLASTY Bilateral 2001   UVULECTOMY       Current Meds  Medication Sig   acetaminophen  (TYLENOL ) 325 MG tablet Take 325 mg by mouth as needed for mild pain.   allopurinol  (ZYLOPRIM ) 300 MG tablet Take 1 tablet (300 mg total) by mouth daily.   amLODipine  (NORVASC ) 10 MG tablet TAKE 1 TABLET BY MOUTH EVERY DAY FOR BLOOD PRESSURE AND HEART   Ascorbic Acid (VITAMIN C WITH ROSE HIPS) 500 MG tablet Take 500 mg by mouth daily.   atorvastatin  (LIPITOR ) 80 MG tablet TAKE 1/2 TABLET BY MOUTH DAILY   B Complex Vitamins (B COMPLEX PO) Take 1 tablet by mouth daily.   carvedilol  (COREG ) 12.5 MG tablet TAKE 1 TABLET (12.5MG  TOTAL) BY MOUTH TWICE A DAY WITH MEALS   cholecalciferol  (VITAMIN D ) 1000 UNITS tablet Take 1,000 Units by mouth daily.   colchicine  0.6 MG tablet take 2 tablet daily now with start of a flare up, take 1 tablet an hour  later   colchicine  0.6 MG tablet Take 1 tablet (0.6 mg total) by mouth daily.   Ferrous Sulfate (IRON ) 325 (65 Fe) MG TABS Take 1 tablet (325 mg total) by mouth every other day.   Fluticasone-Umeclidin-Vilant (TRELEGY ELLIPTA ) 100-62.5-25 MCG/ACT AEPB Inhale 100 mcg into the lungs daily.   furosemide  (LASIX ) 40 MG tablet TAKE 1 TABLET BY MOUTH 1 TO 2 TIMES PER DAY AS NEEDED FOR FLUID RETENTION OR ANKLE SWELLING   levothyroxine  (SYNTHROID ) 100 MCG tablet Take  1 tablet  Daily  on an empty stomach with only water for 30 minutes & no Antacid meds, Calcium  or Magnesium  for 4 hours & avoid Biotin                                                                                 /  TAKE                                                     BY                                                  MOUTH   loratadine  (CLARITIN ) 10 MG tablet Take 10 mg by mouth daily.   metFORMIN  (GLUCOPHAGE -XR) 500 MG 24 hr tablet TAKE 1 TABLETS IN THE MORNING AND 2 TABLETS IN THE EVENING.   nystatin  cream (MYCOSTATIN ) Apply 1 Application topically 2 (two) times daily.   olmesartan  (BENICAR ) 40 MG tablet TAKE 1 TABLET (40 MG TOTAL) BY MOUTH DAILY FOR BLOOD PRESSURE   Omega-3 Fatty Acids (FISH OIL) 1200 MG CAPS Take 1,200 mg by mouth daily.   pantoprazole  (PROTONIX ) 40 MG tablet TAKE 1 TABLET BY MOUTH EVERY DAY BEFORE BREAKFAST   potassium chloride  SA (KLOR-CON  M) 20 MEQ tablet 2TABS BY MOUTH DAILY X1DAY,THEN 1 TAB AS NEED WHEN YOU TAKE LASIX  FOR FLUID RETENTION/ANKLE SWELLING   warfarin (COUMADIN ) 5 MG tablet TAKE 1 TABLET BY MOUTH DAILY. EXCEPT 1 AND 1/2 TABS ON MONDAYS OR AS DIRECTED BY COAGULATION CLINIC   zinc gluconate 50 MG tablet Take 50 mg by mouth daily.     Allergies:   Amiodarone  and Bee venom   Social History   Tobacco Use   Smoking status: Never   Smokeless tobacco: Never  Vaping Use   Vaping status: Never Used  Substance Use Topics   Alcohol use: No   Drug use: No      Family Hx: The patient's family history includes Colon cancer in his sister; Diabetes in his sister; Liver disease in his brother, father, mother, and sister; Lung cancer in his father and mother; Prostate cancer in his brother. There is no history of Stomach cancer, Pancreatic cancer, or Esophageal cancer.  ROS:   Please see the history of present illness.     All other systems reviewed and are negative.   Labs/Other Tests and Data Reviewed:    Recent Labs: 06/10/2023: BNP 81.8 03/14/2024: ALT 18; BUN 15; Creatinine, Ser 1.59; Hemoglobin 14.9; Platelets 235.0; Potassium 3.5; Sodium 138; TSH 1.81   Recent Lipid Panel Lab Results  Component Value Date/Time   CHOL 114 03/14/2024 02:10 PM   TRIG 223.0 (H) 03/14/2024 02:10 PM   HDL 32.60 (L) 03/14/2024 02:10 PM   CHOLHDL 3 03/14/2024 02:10 PM   LDLCALC 37 03/14/2024 02:10 PM   LDLCALC 56 03/17/2023 03:05 PM    Wt Readings from Last 3 Encounters:  05/02/24 232 lb (105.2 kg)  03/16/24 220 lb 0.3 oz (99.8 kg)  03/14/24 233 lb (105.7 kg)     Objective:    Vital Signs:  BP 134/70   Pulse 61   Ht 5' 10 (1.778 m)   Wt 232 lb (105.2 kg)   SpO2 93%   BMI 33.29 kg/m   GEN: Well nourished, well developed in no acute distress HEENT: Normal NECK: No JVD; No carotid bruits LYMPHATICS: No lymphadenopathy CARDIAC:RRR, no murmurs, rubs, gallops RESPIRATORY:  Clear to auscultation without rales, wheezing or rhonchi  ABDOMEN: Soft, non-tender, non-distended MUSCULOSKELETAL:  3+ pitting edema  and severe erythema of both LEs; No deformity  SKIN: Warm and dry NEUROLOGIC:  Alert and oriented x 3 PSYCHIATRIC:  Normal affect  ASSESSMENT & PLAN:    1.  ASCAD/SOB -cath 2017 showed 50% LM, 40% distal LAD, 70% ost to prox LCX, 40% mid LAD and 95% mid to dist RCA s/p PCI with DES x 2 and stage PCI of the LCx/OM.    -Nuclear stress test 04/2021 showed no ischemia -Stress PET CT 08/03/2023 showed no ischemia and EF 62% with reduced myocardial  blood flow reserve -He has chronic shortness of breath which is stable.  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  High-res chest CT confirmed this -Denies any anginal chest pain  -Continue amlodipine  10 mg daily, atorvastatin  80 mg daily, carvedilol  12.5 mg twice daily with as needed refills -no ASA due to DOAC  2.  HTN  -BP controlled on exam today - Continue amlodipine  10 mg daily, carvedilol  12.5 mg twice daily, olmesartan  40 mg daily with as needed refills -I have personally reviewed and interpreted outside labs performed by patient's PCP shows  normal serum creatinine 1.59 and potassium 3.5 on 03/14/2024    3.  Persistent atrial fibrillation  -he is status post [name] ablation in 2018  -amiodarone  stopped due to possible lung toxicity in 2018.  -Denies any bleeding problems on warfarin-continue prescription drug management with warfarin  -I have personally reviewed and interpreted outside labs performed by patient's PCP which showed hemoglobin 14.9 on 03/14/2024  4.  Bilateral carotid artery stenosis  -Dopplers 03/2020 showed 1 to 39% bilateral stenosis and right subclavian stenosis -doppler 06/2022 unchanged -continue statin  5.  Hyperlipidemia  -LDL goal is less than 70.   -I have personally reviewed and interpreted outside labs performed by patient's PCP which showed LDL 37 and HDL 32, ALT 18 on 03/14/2024 - Continue atorvastatin  40 mg daily with as needed refills  6.  Chronic LE edema/? BLE cellulits -his legs are VERY swollen today and extremely erythematous -suspect he has bilateral LE cellulitis -will send over to [facility] ER for further evaluation -Likely will need IV Lasix >>currently takes 40mg  PO BID  Medication  Adjustments/Labs and Tests Ordered: Current medicines are reviewed at length with the patient today.  Concerns regarding medicines are outlined above.  Tests Ordered: No orders of the defined types were placed in this encounter.   Medication Changes: No orders of the defined types were placed in this encounter.    Disposition:  Follow up PA in 4 weeks  Signed, [name], MD  05/02/2024 12:00 PM    [facility]

## 2024-05-02 NOTE — Progress Notes (Signed)
 Description   INR-3.6; Do not take any warfarin today then continue taking warfarin 1.5 tablets daily except 1 tablet on Monday, Thursday, and Saturday. Stay consistent with greens each week (1-2 times per week). Recheck INR in 4 weeks (normally 6 weeks). Coumadin  Clinic: [phone] Fax number [phone]

## 2024-05-02 NOTE — ED Provider Notes (Signed)
 [place] EMERGENCY DEPARTMENT AT [facility] Provider Note   CSN: [id number] Arrival date & time: 05/02/24  1304     Patient presents with: Leg Swelling   Christopher Wade is a 73 y.o. male.  He has a history of diabetes, CKD, gout, sleep apnea, A-fib status post ablation in 2019, still on Coumadin .  He presents today from cardiology office, he is seeing Dr. Shlomo who sent him over for further evaluation for worsening lower extremity swelling, patient states it is worse the past few days, he reports he noticed some redness and that this actually has somewhat improved but the swelling has been worse.  He has been compliant with all of his medications including his diuretics.  He denies any chest pain or shortness of breath, no denies fevers or chills.  States he had been somewhere about his legs about a month ago and was given steroids without relief.  He states he was told they do not believe it was gout.   HPI     Prior to Admission medications   Medication Sig Start Date End Date Taking? Authorizing Provider  acetaminophen  (TYLENOL ) 325 MG tablet Take 325 mg by mouth as needed for mild pain.    [provider]  allopurinol  (ZYLOPRIM ) 300 MG tablet Take 1 tablet (300 mg total) by mouth daily. 02/07/24   [name], NP  amLODipine  (NORVASC ) 10 MG tablet TAKE 1 TABLET BY MOUTH EVERY DAY FOR BLOOD PRESSURE AND HEART 03/06/24   Williamson, Joanna R, NP  Ascorbic Acid (VITAMIN C WITH ROSE HIPS) 500 MG tablet Take 500 mg by mouth daily.    [provider]  atorvastatin  (LIPITOR ) 80 MG tablet TAKE 1/2 TABLET BY MOUTH DAILY 09/16/23   Williamson, Joanna R, NP  B Complex Vitamins (B COMPLEX PO) Take 1 tablet by mouth daily.    [provider]  carvedilol  (COREG ) 12.5 MG tablet TAKE 1 TABLET (12.5MG  TOTAL) BY MOUTH TWICE A DAY WITH MEALS 02/08/24   Williamson, Joanna R, NP  cholecalciferol  (VITAMIN D ) 1000 UNITS tablet Take 1,000 Units by mouth daily.     [provider]  colchicine  0.6 MG tablet take 2 tablet daily now with start of a flare up, take 1 tablet an hour later 03/14/24   [name], NP  colchicine  0.6 MG tablet Take 1 tablet (0.6 mg total) by mouth daily. 04/14/24   Tobie Franky SQUIBB, DPM  Ferrous Sulfate (IRON ) 325 (65 Fe) MG TABS Take 1 tablet (325 mg total) by mouth every other day. 08/16/21   Jeanine Knee, NP  Fluticasone-Umeclidin-Vilant (TRELEGY ELLIPTA ) 100-62.5-25 MCG/ACT AEPB Inhale 100 mcg into the lungs daily. 04/20/23   Cranford, Tonya, NP  furosemide  (LASIX ) 40 MG tablet TAKE 1 TABLET BY MOUTH 1 TO 2 TIMES PER DAY AS NEEDED FOR FLUID RETENTION OR ANKLE SWELLING 02/01/24   Williamson, Joanna R, NP  levothyroxine  (SYNTHROID ) 100 MCG tablet Take  1 tablet  Daily  on an empty stomach with only water for 30 minutes & no Antacid meds, Calcium  or Magnesium  for 4 hours & avoid Biotin                                                                                 /  TAKE                                                     BY                                                  MOUTH 11/09/23   Billy Philippe SAUNDERS, NP  loratadine  (CLARITIN ) 10 MG tablet Take 10 mg by mouth daily.    [provider]  metFORMIN  (GLUCOPHAGE -XR) 500 MG 24 hr tablet TAKE 1 TABLETS IN THE MORNING AND 2 TABLETS IN THE EVENING. 05/01/24   Williamson, Joanna R, NP  nystatin  cream (MYCOSTATIN ) Apply 1 Application topically 2 (two) times daily. 04/05/15   [provider]  olmesartan  (BENICAR ) 40 MG tablet TAKE 1 TABLET (40 MG TOTAL) BY MOUTH DAILY FOR BLOOD PRESSURE 03/27/24   Billy Philippe SAUNDERS, NP  Omega-3 Fatty Acids (FISH OIL) 1200 MG CAPS Take 1,200 mg by mouth daily.    [provider]  pantoprazole  (PROTONIX ) 40 MG tablet TAKE 1 TABLET BY MOUTH EVERY DAY BEFORE BREAKFAST 02/09/24   Williamson, Joanna R, NP  potassium chloride  SA (KLOR-CON  M) 20 MEQ tablet 2TABS BY MOUTH DAILY  X1DAY,THEN 1 TAB AS NEED WHEN YOU TAKE LASIX  FOR FLUID RETENTION/ANKLE SWELLING 10/15/23   [name], MD  warfarin (COUMADIN ) 5 MG tablet TAKE 1 TABLET BY MOUTH DAILY. EXCEPT 1 AND 1/2 TABS ON MONDAYS OR AS DIRECTED BY COAGULATION CLINIC 04/05/24   [name], MD  zinc gluconate 50 MG tablet Take 50 mg by mouth daily.    [provider]    Allergies: Amiodarone  and Bee venom    Review of Systems  Updated Vital Signs BP (!) 141/75 (BP Location: Right Arm)   Pulse 79   Temp 97.7 F (36.5 C) (Oral)   Resp 20   SpO2 93%   Physical Exam Vitals and nursing note reviewed.  Constitutional:      General: He is not in acute distress.    Appearance: He is well-developed.  HENT:     Head: Normocephalic and atraumatic.  Eyes:     Conjunctiva/sclera: Conjunctivae normal.  Cardiovascular:     Rate and Rhythm: Normal rate and regular rhythm.     Heart sounds: No murmur heard. Pulmonary:     Effort: Pulmonary effort is normal. No respiratory distress.     Breath sounds: Normal breath sounds.  Abdominal:     Palpations: Abdomen is soft.     Tenderness: There is no abdominal tenderness.  Musculoskeletal:        General: No tenderness.     Cervical back: Neck supple.     Right lower leg: Edema present.     Left lower leg: Edema present.  Skin:    General: Skin is warm and dry.     Capillary Refill: Capillary refill takes less than 2 seconds.     Comments: Symmetric bilateral anterior lower leg erythema that is blanching with some minimal skin breakdown and weeping of clear fluid.  There is no warmth, no red streaking.  No vesicles and no petechia or purpura.  Neurological:     General: No focal deficit present.  Mental Status: He is alert and oriented to person, place, and time.  Psychiatric:        Mood and Affect: Mood normal.     (all labs ordered are listed, but only abnormal results are displayed) Labs Reviewed  CBG MONITORING, ED - Abnormal; Notable for  the following components:      Result Value   Glucose-Capillary 119 (*)    All other components within normal limits  COMPREHENSIVE METABOLIC PANEL WITH GFR  PRO BRAIN NATRIURETIC PEPTIDE  CBC WITH DIFFERENTIAL/PLATELET    EKG: None  Radiology: No results found.   Procedures   Medications Ordered in the ED - No data to display                                  Medical Decision Making This patient presents to the ED for concern of bilateral lower extremity swelling, this involves an extensive number of treatment options, and is a complaint that carries with it a high risk of complications and morbidity.  The differential diagnosis includes heart failure, peripheral edema, cellulitis, DVT, other   Co morbidities that complicate the patient evaluation :   Mild heart failure Lasix  40 twice daily, A-fib on warfarin, gout   Additional history obtained:  Additional history obtained from EMR External records from outside source obtained and reviewed including prior notes, labs including today's cardiology note   Lab Tests:  I Ordered, and personally interpreted labs.  The pertinent results include: Creatinine at baseline at 1.29.    Cardiac Monitoring: / EKG:  The patient was maintained on a cardiac monitor.  I personally viewed and interpreted the cardiac monitored which showed an underlying rhythm of: Sinus rhythm    Problem List / ED Course / Critical interventions / Medication management  Here for increased swelling in bilateral lower extremities, his BNP is normal, he is not having chest pain or shortness of breath he is not hypoxic.  It has been worse past several days having clear fluid leakage.  Does have some redness on bilateral shins.  He states has been ongoing for over a month, but it has actually improved rather than worsening.  This is not consistent with cellulitis.  Legs were wrapped with Kerlix and Ace wrap, advised on elevation and given wound care  follow-up, likely skin breakdown due to his chronic edema versus venous stasis.  He was given IV Lasix  here and had about a liter of output, but has to double his Lasix  for the next several days, double potassium for the next several days and follow-up with cardiology as well.  He is advised on elevation and compression.  At this time I do not feel he needs admission to the hospital. I have reviewed the patients home medicines and have made adjustments as needed     Amount and/or Complexity of Data Reviewed Labs: ordered. ECG/medicine tests: ordered.  Risk Prescription drug management.        Final diagnoses:  None    ED Discharge Orders     None          Suellen Sherran LABOR, PA-C 05/02/24 1713

## 2024-05-02 NOTE — Discharge Instructions (Addendum)
 Seen today for swelling in your legs and fluid leaking from your legs.  This is from increased fluid in your legs.  Fortunately your blood work was all normal, there does not appear to be a skin infection.  Your heart failure number is normal.  Please take double your dose of furosemide  (lasix ) the next 2 to 3 days and follow-up close with your primary care doctor and your cardiologist.  I also gave you referral for wound care.  We dressed your wounds here.  Keep them clean and dry.  Keep your legs elevated is much as possible to help with the swelling.  Sitting with your legs below heart level can worsen the swelling.  Come back to the ER if you have increased swelling or pain, fevers, chills, chest pain or shortness of breath or other worrisome symptoms.  Make sure you also take double your potassium tablets when taking double Lasix .

## 2024-05-02 NOTE — ED Triage Notes (Signed)
 Referred by Cardiologist for increased bilateral leg swelling. Increased SHOB with exertion. Denies CP.

## 2024-05-04 ENCOUNTER — Telehealth: Payer: Self-pay | Admitting: Family Medicine

## 2024-05-04 ENCOUNTER — Other Ambulatory Visit: Payer: Self-pay | Admitting: Family Medicine

## 2024-05-04 NOTE — Telephone Encounter (Signed)
 noted

## 2024-05-04 NOTE — Telephone Encounter (Signed)
 Patient dropped off document verification of chronic illness form, to be filled out by provider. Patient requested to send it back via Fax within 7-days. Document is located in providers tray at front office.Please advise at home. Pt requests a call when it has been faxed

## 2024-05-05 ENCOUNTER — Other Ambulatory Visit: Payer: Self-pay | Admitting: Family Medicine

## 2024-05-05 DIAGNOSIS — I10 Essential (primary) hypertension: Secondary | ICD-10-CM

## 2024-05-11 ENCOUNTER — Encounter: Payer: Self-pay | Admitting: Pulmonary Disease

## 2024-05-11 ENCOUNTER — Ambulatory Visit: Admitting: Pulmonary Disease

## 2024-05-11 DIAGNOSIS — J849 Interstitial pulmonary disease, unspecified: Secondary | ICD-10-CM

## 2024-05-11 DIAGNOSIS — J4489 Other specified chronic obstructive pulmonary disease: Secondary | ICD-10-CM | POA: Diagnosis not present

## 2024-05-11 DIAGNOSIS — R0609 Other forms of dyspnea: Secondary | ICD-10-CM

## 2024-05-11 MED ORDER — TRELEGY ELLIPTA 100-62.5-25 MCG/ACT IN AEPB: 100.0000 ug | INHALATION_SPRAY | Freq: Every day | RESPIRATORY_TRACT | 12 refills | Status: AC

## 2024-05-11 NOTE — Patient Instructions (Signed)
 Trelegy refilled  Return to clinic in 6 months or sooner as needed

## 2024-05-11 NOTE — Progress Notes (Signed)
 "  @Patient  ID: Christopher Wade, male    DOB: Mar 24, 1951, 73 y.o.   MRN: [id number]  Chief Complaint  Patient presents with   Follow-up    Referring provider: [name], NP  HPI:   73 y.o. man whom are seen for evaluation of dyspnea on exertion.  Most recent cardiology note reviewed.  Most recent PCP note reviewed.  Returns for routine follow-up. Stable. Trelegy helps. No exacerbations or worsening since last visit.   HPI at initial visit: Shortness of breath for some time.  Couple years.  Worse with inclines or stairs.  No time of day when things are better or worse.  No position that make things better or worse.  No seasonal or environmental factors he can identify that make things better or worse.  Noted 03/2023 to be hypoxemic with exertion of 74% in PCP office.  No oxygen was ordered.  Referred to pulmonary.  Not clear why oxygen was not ordered.  In the past on the inhalers did not help very much.  He had most recent CT scan high-resolution chest 01/2017 was clear, no signs of ILD on my review and interpretation.  There was some concern for possible early UIP with interlobular septal thickening but there was no honeycombing, frankly I see very mild if any interlobular septal thickening.  In 2022 he had a TTE that showed a dilated left atrium, likely diastolic dysfunction.  Most recent chest imaging with a chest x-ray 11/2022 that shows enlarged heart, clear lungs, right hemidiaphragm elevation.  Questionaires / Pulmonary Flowsheets:   ACT:      No data to display          MMRC:     No data to display          Epworth:      No data to display          Tests:   FENO:  No results found for: NITRICOXIDE  PFT:    Latest Ref Rng & Units 09/29/2023    9:58 AM 11/11/2015    2:48 PM  PFT Results  FVC-Pre L 3.15  3.41   FVC-Predicted Pre % 73  71   FVC-Post L 3.23  3.57   FVC-Predicted Post % 74  74   Pre FEV1/FVC % % 61  75   Post FEV1/FCV % % 70   78   FEV1-Pre L 1.93  2.55   FEV1-Predicted Pre % 61  71   FEV1-Post L 2.26  2.77   DLCO uncorrected ml/min/mmHg 12.83  18.89   DLCO UNC% % 50  56   DLCO corrected ml/min/mmHg  18.34   DLCO COR %Predicted %  54   DLVA Predicted % 75  71   TLC L 4.51  5.42   TLC % Predicted % 64  75   RV % Predicted % 58  80   Personally reviewed moderate fixed obstruction resolves with bronchodilator response, significant in FEV1, lung volumes moderately reduced, DLCO moderately reduced, spirometry largely unchanged but reduction in TLC and DLCO in interim since 2017  WALK:     02/02/2017    3:50 PM 07/29/2016   12:47 PM 01/13/2016    2:18 PM  SIX MIN WALK  Supplimental Oxygen during Test? (L/min) No No No    Imaging: Personally reviewed and as per EMR and discussion in this note US  Venous Img Lower Bilateral Result Date: 05/22/2024 CLINICAL DATA:  Bilateral lower extremity edema EXAM: BILATERAL LOWER EXTREMITY VENOUS  DOPPLER ULTRASOUND TECHNIQUE: Gray-scale sonography with graded compression, as well as color Doppler and duplex ultrasound were performed to evaluate the lower extremity deep venous systems from the level of the common femoral vein and including the common femoral, femoral, profunda femoral, popliteal and calf veins including the posterior tibial, peroneal and gastrocnemius veins when visible. The superficial great saphenous vein was also interrogated. Spectral Doppler was utilized to evaluate flow at rest and with distal augmentation maneuvers in the common femoral, femoral and popliteal veins. COMPARISON:  None Available. FINDINGS: RIGHT LOWER EXTREMITY Common Femoral Vein: No evidence of thrombus. Normal compressibility, respiratory phasicity and response to augmentation. Saphenofemoral Junction: No evidence of thrombus. Normal compressibility and flow on color Doppler imaging. Profunda Femoral Vein: No evidence of thrombus. Normal compressibility and flow on color Doppler imaging. Femoral  Vein: No evidence of thrombus. Normal compressibility, respiratory phasicity and response to augmentation. Popliteal Vein: No evidence of thrombus. Normal compressibility, respiratory phasicity and response to augmentation. Calf Veins: No evidence of thrombus. Normal compressibility and flow on color Doppler imaging. Superficial Great Saphenous Vein: No evidence of thrombus. Normal compressibility. Venous Reflux:  None. Other Findings:  None. LEFT LOWER EXTREMITY Common Femoral Vein: No evidence of thrombus. Normal compressibility, respiratory phasicity and response to augmentation. Saphenofemoral Junction: No evidence of thrombus. Normal compressibility and flow on color Doppler imaging. Profunda Femoral Vein: No evidence of thrombus. Normal compressibility and flow on color Doppler imaging. Femoral Vein: No evidence of thrombus. Normal compressibility, respiratory phasicity and response to augmentation. Popliteal Vein: No evidence of thrombus. Normal compressibility, respiratory phasicity and response to augmentation. Calf Veins: No evidence of thrombus. Normal compressibility and flow on color Doppler imaging. Superficial Great Saphenous Vein: No evidence of thrombus. Normal compressibility. Venous Reflux:  None. Other Findings:  None. IMPRESSION: No evidence of deep venous thrombosis in either lower extremity. Electronically Signed   By: Wilkie Lent M.D.   On: 05/22/2024 11:30    Lab Results: Personally reviewed CBC    Component Value Date/Time   WBC 8.6 05/22/2024 1109   RBC 4.18 (L) 05/22/2024 1109   HGB 14.5 05/22/2024 1109   HGB 13.3 06/01/2016 1240   HCT 41.7 05/22/2024 1109   HCT 39.2 06/01/2016 1240   PLT 271 05/22/2024 1109   PLT 240 06/01/2016 1240   MCV 99.8 05/22/2024 1109   MCV 97 06/01/2016 1240   MCH 34.7 (H) 05/22/2024 1109   MCHC 34.8 05/22/2024 1109   RDW 14.1 05/22/2024 1109   RDW 15.2 06/01/2016 1240   LYMPHSABS 1.1 05/22/2024 1109   LYMPHSABS 0.9 06/01/2016 1240    MONOABS 0.9 05/22/2024 1109   EOSABS 0.2 05/22/2024 1109   EOSABS 0.1 06/01/2016 1240   BASOSABS 0.0 05/22/2024 1109   BASOSABS 0.0 06/01/2016 1240    BMET    Component Value Date/Time   NA 139 05/22/2024 1109   NA 142 06/24/2023 1233   K 3.6 05/22/2024 1109   CL 100 05/22/2024 1109   CO2 25 05/22/2024 1109   GLUCOSE 130 (H) 05/22/2024 1109   BUN 12 05/22/2024 1109   BUN 10 06/24/2023 1233   CREATININE 0.94 05/22/2024 1109   CREATININE 1.56 (H) 03/17/2023 1505   CALCIUM  9.7 05/22/2024 1109   GFRNONAA >60 05/22/2024 1109   GFRNONAA 71 11/06/2020 1506   GFRAA 82 11/06/2020 1506    BNP    Component Value Date/Time   BNP 81.8 06/10/2023 1535   BNP 86.8 05/27/2020 0829    ProBNP    Component  Value Date/Time   PROBNP 758.0 (H) 05/22/2024 1109   PROBNP 238 08/14/2021 1122    Specialty Problems       Pulmonary Problems   OSA (obstructive sleep apnea)   noncompliant with CPAP      Dyspnea on exertion   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, 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 2018; non-smoker, Possible UIP with  summer 2017 through Feb 2018   Difficult airway    Allergies  Allergen Reactions   Amiodarone  Other (See Comments)    Lung toxicity   Bee Venom Other (See Comments)    Leg went numb    Immunization History  Administered Date(s) Administered   INFLUENZA, HIGH DOSE SEASONAL PF 03/31/2018, 03/20/2019, 05/07/2020, 05/28/2021, 03/17/2023, 03/14/2024   Influenza Split 04/05/2014   Influenza-Unspecified 02/02/2013, 05/21/2016   PFIZER Comirnaty(Gray Top)Covid-19 Tri-Sucrose Vaccine 05/30/2021   PFIZER(Purple Top)SARS-COV-2 Vaccination 12/13/2019, 01/03/2020, 07/09/2020   Pfizer(Comirnaty)Fall Seasonal Vaccine 12 years and older 03/17/2023, 03/20/2024   Pneumococcal Conjugate-13 05/01/2014   Pneumococcal Polysaccharide-23 05/16/2015   Td 04/25/2010, 05/07/2020   Varicella Zoster Immune Globulin 02/22/2014   Zoster Recombinant(Shingrix)  06/01/2022, 08/18/2022    Past Medical History:  Diagnosis Date   AKI (acute kidney injury) 05/27/2020   Arthritis    joints get stiff (06/11/2016)   Bee sting allergy    Bee sting allergy    CAD (coronary artery disease), native coronary artery 02/12/2016   cath showing 50% LM, 40% distal LAD, 70% ost to prox LCs, 40% mid LAD and 95% mid to dist RCA s/p PCI with DES x 2 and stage PCI of the LCx/OM.   Carotid artery stenosis    1-39% bilateral carotid stenosis by dopples 06/2022   Difficult airway 11/30/2023   Duodenal ulcer    History of gout    Hx of adenomatous colonic polyps 04/10/2008   Hyperlipidemia    Hypertension    Hypothyroidism    OSA (obstructive sleep apnea)    tried CPAP; couldn't do it (06/11/2016)   Persistent atrial fibrillation (HCC)    s/p DCCV 03/2015 with reversion back to atrial fibrillation, loaded with Amio and s/p DCCV to NSR 11/2015.  He is now s/p afib albation.  He had amio lung toxicity and amio was stopped.   Subclavian artery stenosis    right subclavian artery stenosis by dopplers 2/24   Thyroid  disease    Type II diabetes mellitus (HCC)    Vitamin D  deficiency     Tobacco History: Social History   Tobacco Use  Smoking Status Never  Smokeless Tobacco Never   Counseling given: Not Answered   Continue to not smoke  Outpatient Encounter Medications as of 05/11/2024  Medication Sig   acetaminophen  (TYLENOL ) 325 MG tablet Take 325 mg by mouth as needed for mild pain.   allopurinol  (ZYLOPRIM ) 300 MG tablet TAKE 1 TABLET BY MOUTH EVERY DAY   Ascorbic Acid (VITAMIN C WITH ROSE HIPS) 500 MG tablet Take 500 mg by mouth daily.   B Complex Vitamins (B COMPLEX PO) Take 1 tablet by mouth daily.   carvedilol  (COREG ) 12.5 MG tablet TAKE 1 TABLET (12.5MG  TOTAL) BY MOUTH TWICE A DAY WITH MEALS   cholecalciferol  (VITAMIN D ) 1000 UNITS tablet Take 1,000 Units by mouth daily.   colchicine  0.6 MG tablet take 2 tablet daily now with start of a flare up,  take 1 tablet an hour later (Patient not taking: Reported on 05/24/2024)   Ferrous Sulfate (IRON ) 325 (65 Fe)  MG TABS Take 1 tablet (325 mg total) by mouth every other day.   furosemide  (LASIX ) 40 MG tablet TAKE 1 TABLET BY MOUTH 1 TO 2 TIMES PER DAY AS NEEDED FOR FLUID RETENTION OR ANKLE SWELLING (Patient not taking: Reported on 05/24/2024)   levothyroxine  (SYNTHROID ) 100 MCG tablet Take  1 tablet  Daily  on an empty stomach with only water for 30 minutes & no Antacid meds, Calcium  or Magnesium  for 4 hours & avoid Biotin                                                                                 /                                         TAKE                                                     BY                                                  MOUTH   loratadine  (CLARITIN ) 10 MG tablet Take 10 mg by mouth daily.   metFORMIN  (GLUCOPHAGE -XR) 500 MG 24 hr tablet TAKE 1 TABLETS IN THE MORNING AND 2 TABLETS IN THE EVENING.   nystatin  cream (MYCOSTATIN ) Apply 1 Application topically 2 (two) times daily. (Patient not taking: Reported on 05/24/2024)   olmesartan  (BENICAR ) 40 MG tablet TAKE 1 TABLET (40 MG TOTAL) BY MOUTH DAILY FOR BLOOD PRESSURE   Omega-3 Fatty Acids (FISH OIL) 1200 MG CAPS Take 1,200 mg by mouth daily.   pantoprazole  (PROTONIX ) 40 MG tablet TAKE 1 TABLET BY MOUTH EVERY DAY BEFORE BREAKFAST   potassium chloride  SA (KLOR-CON  M) 20 MEQ tablet 2TABS BY MOUTH DAILY X1DAY,THEN 1 TAB AS NEED WHEN YOU TAKE LASIX  FOR FLUID RETENTION/ANKLE SWELLING   warfarin (COUMADIN ) 5 MG tablet TAKE 1 TABLET BY MOUTH DAILY. EXCEPT 1 AND 1/2 TABS ON MONDAYS OR AS DIRECTED BY COAGULATION CLINIC   zinc gluconate 50 MG tablet Take 50 mg by mouth daily.   [DISCONTINUED] amLODipine  (NORVASC ) 10 MG tablet TAKE 1 TABLET BY MOUTH EVERY DAY FOR BLOOD PRESSURE AND HEART   [DISCONTINUED] atorvastatin  (LIPITOR ) 80 MG tablet TAKE 1/2 TABLET BY MOUTH DAILY   [DISCONTINUED] colchicine  0.6 MG tablet Take 1 tablet (0.6 mg  total) by mouth daily. (Patient not taking: Reported on 05/24/2024)   [DISCONTINUED] Fluticasone-Umeclidin-Vilant (TRELEGY ELLIPTA ) 100-62.5-25 MCG/ACT AEPB Inhale 100 mcg into the lungs daily.   Fluticasone-Umeclidin-Vilant (TRELEGY ELLIPTA ) 100-62.5-25 MCG/ACT AEPB Inhale 100 mcg into the lungs daily.   No facility-administered encounter medications on file as of 05/11/2024.     Review of Systems  Review of Systems  N/a  Physical Exam  BP 134/63   Pulse 71  Ht 5' 10 (1.778 m)   Wt 227 lb (103 kg)   SpO2 93%   BMI 32.57 kg/m   Wt Readings from Last 5 Encounters:  05/24/24 231 lb (104.8 kg)  05/11/24 227 lb (103 kg)  05/02/24 232 lb (105.2 kg)  03/16/24 220 lb 0.3 oz (99.8 kg)  03/14/24 233 lb (105.7 kg)    BMI Readings from Last 5 Encounters:  05/24/24 33.15 kg/m  05/11/24 32.57 kg/m  05/02/24 33.29 kg/m  03/16/24 31.57 kg/m  03/14/24 33.43 kg/m     Physical Exam General: Sitting up Eyes: No MI Neck: No JVP appreciated Pulmonary: Clear, no work of breathing Abdomen: Not distended    Assessment & Plan:   Dyspnea on exertion: Multifactoral related to cardiac abnormalities, deconditioning, elevated left diaphragm.  Improved with Trelegy, likely significant pulmonary contributor as well.  COPD/asthma overlap: For congestion with positive bronchodilator response in 2025.  Continue Trelegy.  Refilled today.  Has provided symptomatic improvement.  ILD: Mostly endobronchial nodules noted upper lobe, there are some linear or interlobular septal thickening the lower lobes favored to represent atelectasis.  Serologic workup in 2018 negative.  Repeat serologic workup 2025 also negative.  CT scan stable over time, likely indicative of chronic changes from prior amiodarone  toxicity vs mild HSP.   Return in about 6 months (around 11/09/2024) for f/u Dr. Annella.   Donnice JONELLE Annella, MD 05/24/2024     "

## 2024-05-14 ENCOUNTER — Other Ambulatory Visit: Payer: Self-pay | Admitting: Family Medicine

## 2024-05-22 ENCOUNTER — Emergency Department (HOSPITAL_BASED_OUTPATIENT_CLINIC_OR_DEPARTMENT_OTHER)
Admission: EM | Admit: 2024-05-22 | Discharge: 2024-05-22 | Disposition: A | Discharge status: Home / Self Care | Attending: Emergency Medicine | Admitting: Emergency Medicine

## 2024-05-22 ENCOUNTER — Encounter (HOSPITAL_BASED_OUTPATIENT_CLINIC_OR_DEPARTMENT_OTHER): Payer: Self-pay | Admitting: Emergency Medicine

## 2024-05-22 ENCOUNTER — Emergency Department (HOSPITAL_BASED_OUTPATIENT_CLINIC_OR_DEPARTMENT_OTHER)

## 2024-05-22 ENCOUNTER — Other Ambulatory Visit: Payer: Self-pay

## 2024-05-22 DIAGNOSIS — Z79899 Other long term (current) drug therapy: Secondary | ICD-10-CM | POA: Diagnosis not present

## 2024-05-22 DIAGNOSIS — Z7984 Long term (current) use of oral hypoglycemic drugs: Secondary | ICD-10-CM | POA: Diagnosis not present

## 2024-05-22 DIAGNOSIS — I10 Essential (primary) hypertension: Secondary | ICD-10-CM | POA: Diagnosis not present

## 2024-05-22 DIAGNOSIS — E119 Type 2 diabetes mellitus without complications: Secondary | ICD-10-CM | POA: Diagnosis not present

## 2024-05-22 DIAGNOSIS — Z7901 Long term (current) use of anticoagulants: Secondary | ICD-10-CM | POA: Diagnosis not present

## 2024-05-22 DIAGNOSIS — I872 Venous insufficiency (chronic) (peripheral): Secondary | ICD-10-CM | POA: Diagnosis not present

## 2024-05-22 DIAGNOSIS — R600 Localized edema: Secondary | ICD-10-CM | POA: Diagnosis present

## 2024-05-22 DIAGNOSIS — I2510 Atherosclerotic heart disease of native coronary artery without angina pectoris: Secondary | ICD-10-CM | POA: Diagnosis not present

## 2024-05-22 LAB — BASIC METABOLIC PANEL WITH GFR
Anion gap: 14 (ref 5–15)
BUN: 12 mg/dL (ref 8–23)
CO2: 25 mmol/L (ref 22–32)
Calcium: 9.7 mg/dL (ref 8.9–10.3)
Chloride: 100 mmol/L (ref 98–111)
Creatinine, Ser: 0.94 mg/dL (ref 0.61–1.24)
GFR, Estimated: >60 mL/min
Glucose, Bld: 130 mg/dL — ABNORMAL HIGH (ref 70–99)
Potassium: 3.6 mmol/L (ref 3.5–5.1)
Sodium: 139 mmol/L (ref 135–145)

## 2024-05-22 LAB — CBC WITH DIFFERENTIAL/PLATELET
Abs Immature Granulocytes: 0.05 K/uL (ref 0.00–0.07)
Basophils Absolute: 0 K/uL (ref 0.0–0.1)
Basophils Relative: 1 %
Eosinophils Absolute: 0.2 K/uL (ref 0.0–0.5)
Eosinophils Relative: 2 %
HCT: 41.7 % (ref 39.0–52.0)
Hemoglobin: 14.5 g/dL (ref 13.0–17.0)
Immature Granulocytes: 1 %
Lymphocytes Relative: 13 %
Lymphs Abs: 1.1 K/uL (ref 0.7–4.0)
MCH: 34.7 pg — ABNORMAL HIGH (ref 26.0–34.0)
MCHC: 34.8 g/dL (ref 30.0–36.0)
MCV: 99.8 fL (ref 80.0–100.0)
Monocytes Absolute: 0.9 K/uL (ref 0.1–1.0)
Monocytes Relative: 10 %
Neutro Abs: 6.4 K/uL (ref 1.7–7.7)
Neutrophils Relative %: 73 %
Platelets: 271 K/uL (ref 150–400)
RBC: 4.18 MIL/uL — ABNORMAL LOW (ref 4.22–5.81)
RDW: 14.1 % (ref 11.5–15.5)
WBC: 8.6 K/uL (ref 4.0–10.5)
nRBC: 0 % (ref 0.0–0.2)

## 2024-05-22 LAB — PRO BRAIN NATRIURETIC PEPTIDE: Pro Brain Natriuretic Peptide: 758 pg/mL — ABNORMAL HIGH

## 2024-05-22 MED ORDER — CEPHALEXIN 250 MG PO CAPS: 500.0000 mg | ORAL_CAPSULE | Freq: Two times a day (BID) | ORAL | 0 refills | Status: DC

## 2024-05-22 MED ORDER — FUROSEMIDE 40 MG PO TABS: 40.0000 mg | ORAL_TABLET | Freq: Three times a day (TID) | ORAL | 0 refills | Status: DC

## 2024-05-22 MED ORDER — CEPHALEXIN 250 MG PO CAPS
250.0000 mg | ORAL_CAPSULE | Freq: Once | ORAL | Status: AC
  Administered 2024-05-22: 250 mg via ORAL
  Filled 2024-05-22: qty 1

## 2024-05-22 MED ORDER — FUROSEMIDE 10 MG/ML IJ SOLN
40.0000 mg | Freq: Once | INTRAMUSCULAR | Status: AC
  Administered 2024-05-22: 40 mg via INTRAVENOUS
  Filled 2024-05-22: qty 4

## 2024-05-22 MED ORDER — AMLODIPINE BESYLATE 5 MG PO TABS
10.0000 mg | ORAL_TABLET | Freq: Once | ORAL | Status: AC
  Administered 2024-05-22: 10 mg via ORAL
  Filled 2024-05-22: qty 2

## 2024-05-22 NOTE — ED Triage Notes (Signed)
 C/o HTN (200s/100s) last night. Increased bilateral leg swelling.  Also states did not take BP meds today, Denies CP.

## 2024-05-22 NOTE — ED Notes (Signed)
 Legs wrapped bilaterally with petroleum gauze and kerlux and ACE wraps.

## 2024-05-22 NOTE — ED Notes (Signed)
 ED Provider at bedside.

## 2024-05-22 NOTE — Discharge Instructions (Addendum)
 Christopher Wade  Thank you for allowing us  to take care of you today.  You came to the Emergency Department today because you had worsening pain, swelling, wounds of your bilateral legs.  Here in the emergency department does not like you are bit volume overloaded with too much fluid in your body.  We want you to take your fluid pill (furosemide /Lasix ) 3 times a day for the next 3 days (you usually only take it twice a day) you also maybe have a bit of infection on your wound so we are starting you on a antibiotic called Keflex .  You will take this twice a day for the next 5 days.  Thereafter you should continue to follow-up with your primary care doctor as well as your wound care doctor.   To-Do: 1. Please follow-up with your primary doctor within 1 - 2 weeks / as soon as possible.   Please return to the Emergency Department or call 911 if you experience have worsening of your symptoms, or do not get better, chest pain, shortness of breath, severe or significantly worsening pain, high fever, severe confusion, pass out or have any reason to think that you need emergency medical care.   We hope you feel better soon.   Mitzie Later, MD Department of Emergency Medicine [facility]

## 2024-05-22 NOTE — ED Provider Notes (Signed)
 " [place] EMERGENCY DEPARTMENT AT [facility] Provider Note   CSN: [id number] Arrival date & time: 05/22/24  9080     History Chief Complaint  Patient presents with   Leg Swelling    HPI: Christopher Wade is a 73 y.o. male with history pertinent for chronic venous stasis, AKI, CAD, HTN, HLD, OSA, T2DM who presents complaining of leg swelling, wounds, pain. Patient arrived via POV.  History provided by patient.  No interpreter required during this encounter.  Patient reports that he has had longstanding leg swelling and wounds.  Reports that he previously has been evaluated for this, was given a shot however did not feel that this worked.  Reports that he was referred to wound care, and has an appointment on 05/31/2024, however is having ongoing pain and worsening wounds and did not feel like he was able to wait that long.  Reports that the pain pain and swelling is worsening, no fevers, chills, chest pain, shortness of breath.  Reports that he has always had leg swelling, reports that his podiatrist put him on colchicine  several weeks ago and after that he developed the ulcerations on his legs.  Patient's recorded medical, surgical, social, medication list and allergies were reviewed in the Snapshot window as part of the initial history.   Prior to Admission medications  Medication Sig Start Date End Date Taking? Authorizing Provider  cephALEXin  (KEFLEX ) 250 MG capsule Take 2 capsules (500 mg total) by mouth 2 (two) times daily. 05/22/24  Yes Rogelia Jerilynn RAMAN, MD  furosemide  (LASIX ) 40 MG tablet Take 1 tablet (40 mg total) by mouth with breakfast, with lunch, and with evening meal. 05/22/24 05/25/24 Yes Rogelia Jerilynn RAMAN, MD  acetaminophen  (TYLENOL ) 325 MG tablet Take 325 mg by mouth as needed for mild pain.    [provider]  allopurinol  (ZYLOPRIM ) 300 MG tablet TAKE 1 TABLET BY MOUTH EVERY DAY 05/04/24   Williamson, Joanna R, NP  amLODipine  (NORVASC ) 10 MG tablet TAKE  1 TABLET BY MOUTH EVERY DAY FOR BLOOD PRESSURE AND HEART 03/06/24   Williamson, Joanna R, NP  Ascorbic Acid (VITAMIN C WITH ROSE HIPS) 500 MG tablet Take 500 mg by mouth daily.    [provider]  atorvastatin  (LIPITOR ) 80 MG tablet TAKE 1/2 TABLET BY MOUTH DAILY 05/15/24   Williamson, Joanna R, NP  B Complex Vitamins (B COMPLEX PO) Take 1 tablet by mouth daily.    [provider]  carvedilol  (COREG ) 12.5 MG tablet TAKE 1 TABLET (12.5MG  TOTAL) BY MOUTH TWICE A DAY WITH MEALS 05/05/24   Williamson, Joanna R, NP  cholecalciferol  (VITAMIN D ) 1000 UNITS tablet Take 1,000 Units by mouth daily.    [provider]  colchicine  0.6 MG tablet take 2 tablet daily now with start of a flare up, take 1 tablet an hour later 03/14/24   [name], NP  colchicine  0.6 MG tablet Take 1 tablet (0.6 mg total) by mouth daily. 04/14/24   Tobie Franky SQUIBB, DPM  Ferrous Sulfate (IRON ) 325 (65 Fe) MG TABS Take 1 tablet (325 mg total) by mouth every other day. 08/16/21   Jeanine Knee, NP  Fluticasone-Umeclidin-Vilant (TRELEGY ELLIPTA ) 100-62.5-25 MCG/ACT AEPB Inhale 100 mcg into the lungs daily. 05/11/24   Hunsucker, Donnice JONELLE, MD  furosemide  (LASIX ) 40 MG tablet TAKE 1 TABLET BY MOUTH 1 TO 2 TIMES PER DAY AS NEEDED FOR FLUID RETENTION OR ANKLE SWELLING 02/01/24   [name], NP  levothyroxine  (SYNTHROID ) 100 MCG  tablet Take  1 tablet  Daily  on an empty stomach with only water for 30 minutes & no Antacid meds, Calcium  or Magnesium  for 4 hours & avoid Biotin                                                                                 /                                         TAKE                                                     BY                                                  MOUTH 11/09/23   Williamson, Joanna R, NP  loratadine  (CLARITIN ) 10 MG tablet Take 10 mg by mouth daily.    [provider]  metFORMIN  (GLUCOPHAGE -XR) 500 MG 24 hr tablet TAKE 1 TABLETS IN THE  MORNING AND 2 TABLETS IN THE EVENING. 05/01/24   Williamson, Joanna R, NP  nystatin  cream (MYCOSTATIN ) Apply 1 Application topically 2 (two) times daily. 04/05/15   [provider]  olmesartan  (BENICAR ) 40 MG tablet TAKE 1 TABLET (40 MG TOTAL) BY MOUTH DAILY FOR BLOOD PRESSURE 03/27/24   Billy Philippe SAUNDERS, NP  Omega-3 Fatty Acids (FISH OIL) 1200 MG CAPS Take 1,200 mg by mouth daily.    [provider]  pantoprazole  (PROTONIX ) 40 MG tablet TAKE 1 TABLET BY MOUTH EVERY DAY BEFORE BREAKFAST 02/09/24   Williamson, Joanna R, NP  potassium chloride  SA (KLOR-CON  M) 20 MEQ tablet 2TABS BY MOUTH DAILY X1DAY,THEN 1 TAB AS NEED WHEN YOU TAKE LASIX  FOR FLUID RETENTION/ANKLE SWELLING 10/15/23   [name], MD  warfarin (COUMADIN ) 5 MG tablet TAKE 1 TABLET BY MOUTH DAILY. EXCEPT 1 AND 1/2 TABS ON MONDAYS OR AS DIRECTED BY COAGULATION CLINIC 04/05/24   [name], MD  zinc gluconate 50 MG tablet Take 50 mg by mouth daily.    [provider]     Allergies: Amiodarone  and Bee venom   Review of Systems   ROS as per HPI  Physical Exam Updated Vital Signs BP (!) 183/86   Pulse 78   Temp 97.7 F (36.5 C) (Oral)   Resp 18   SpO2 98%  Physical Exam Vitals and nursing note reviewed.  Constitutional:      General: He is not in acute distress.    Appearance: He is well-developed.  HENT:     Head: Normocephalic and atraumatic.  Eyes:     Conjunctiva/sclera: Conjunctivae normal.  Cardiovascular:     Rate and Rhythm: Normal rate and regular rhythm.     Heart sounds: No murmur heard. Pulmonary:  Effort: Pulmonary effort is normal. No respiratory distress.     Breath sounds: Normal breath sounds.  Abdominal:     Palpations: Abdomen is soft.     Tenderness: There is no abdominal tenderness.  Musculoskeletal:        General: No swelling.     Cervical back: Neck supple.     Right lower leg: Edema present.     Left lower leg: Edema present.  Skin:    General: Skin  is warm and dry.     Capillary Refill: Capillary refill takes less than 2 seconds.     Findings: Lesion present.  Neurological:     Mental Status: He is alert.  Psychiatric:        Mood and Affect: Mood normal.     ED Course/ Medical Decision Making/ A&P    Procedures Procedures   Medications Ordered in ED Medications  furosemide  (LASIX ) injection 40 mg (40 mg Intravenous Given 05/22/24 1202)  amLODipine  (NORVASC ) tablet 10 mg (10 mg Oral Given 05/22/24 1342)  cephALEXin  (KEFLEX ) capsule 250 mg (250 mg Oral Given 05/22/24 1342)    Medical Decision Making:   RASMUS PREUSSER is a 73 y.o. male who presents for leg pain and swelling as per above.  Physical exam is pertinent for left greater than right bilateral lower extremity edema with venous stasis ulcerations to the anterior legs bilaterally and tenderness to palpation.   The differential includes but is not limited to cellulitis, volume overload, sepsis, DVT.  Independent historian: None  External data reviewed: Labs: reviewed prior labs for baseline  Initial Plan:  Screening labs including CBC and Metabolic panel to evaluate for infectious or metabolic etiology of disease.  BMP to evaluate volume status DVT ultrasound to evaluate for underlying DVT contributing to presentation Objective evaluation as below reviewed   Labs: Ordered, Independent interpretation, and Details: CBC without leukocytosis, anemia, thrombocytopenia.  BMP without AKI, emergent electrolyte derangement.  BNP is elevated at 700 which is greater on comparison to prior  Radiology: Ordered, Independent interpretation, Details: Personally reviewed ultrasounds of the legs I do not appreciate noncompressible vessel, and All images reviewed independently.  Agree with radiology report at this time.   US  Venous Img Lower Bilateral Result Date: 05/22/2024 CLINICAL DATA:  Bilateral lower extremity edema EXAM: BILATERAL LOWER EXTREMITY VENOUS DOPPLER ULTRASOUND  TECHNIQUE: Gray-scale sonography with graded compression, as well as color Doppler and duplex ultrasound were performed to evaluate the lower extremity deep venous systems from the level of the common femoral vein and including the common femoral, femoral, profunda femoral, popliteal and calf veins including the posterior tibial, peroneal and gastrocnemius veins when visible. The superficial great saphenous vein was also interrogated. Spectral Doppler was utilized to evaluate flow at rest and with distal augmentation maneuvers in the common femoral, femoral and popliteal veins. COMPARISON:  None Available. FINDINGS: RIGHT LOWER EXTREMITY Common Femoral Vein: No evidence of thrombus. Normal compressibility, respiratory phasicity and response to augmentation. Saphenofemoral Junction: No evidence of thrombus. Normal compressibility and flow on color Doppler imaging. Profunda Femoral Vein: No evidence of thrombus. Normal compressibility and flow on color Doppler imaging. Femoral Vein: No evidence of thrombus. Normal compressibility, respiratory phasicity and response to augmentation. Popliteal Vein: No evidence of thrombus. Normal compressibility, respiratory phasicity and response to augmentation. Calf Veins: No evidence of thrombus. Normal compressibility and flow on color Doppler imaging. Superficial Great Saphenous Vein: No evidence of thrombus. Normal compressibility. Venous Reflux:  None. Other Findings:  None. LEFT LOWER  EXTREMITY Common Femoral Vein: No evidence of thrombus. Normal compressibility, respiratory phasicity and response to augmentation. Saphenofemoral Junction: No evidence of thrombus. Normal compressibility and flow on color Doppler imaging. Profunda Femoral Vein: No evidence of thrombus. Normal compressibility and flow on color Doppler imaging. Femoral Vein: No evidence of thrombus. Normal compressibility, respiratory phasicity and response to augmentation. Popliteal Vein: No evidence of thrombus.  Normal compressibility, respiratory phasicity and response to augmentation. Calf Veins: No evidence of thrombus. Normal compressibility and flow on color Doppler imaging. Superficial Great Saphenous Vein: No evidence of thrombus. Normal compressibility. Venous Reflux:  None. Other Findings:  None. IMPRESSION: No evidence of deep venous thrombosis in either lower extremity. Electronically Signed   By: Wilkie Lent M.D.   On: 05/22/2024 11:30    EKG/Medicine tests: Not indicated EKG Interpretation:    Interventions: Lasix , Keflex , amlodipine   See the EMR for full details regarding lab and imaging results.  Patient reports that he did not take his antihypertensive plus medic but does not have any symptoms of hypertensive emergency (focal neurologic deficits, confusion, vision changes, chest pain, shortness of breath, also no AKI on labs) patient given home dose of amlodipine .  Given asymmetric swelling and progressive pain of bilateral lower extremities, underwent ultrasound which does not reveal DVT.  Patient does have progression of BNP, patient saturating well on room air, tolerating p.o., given dose of IV Lasix , do feel that volume overload is contributing to patient's presentation, recommended increasing his home Lasix  from 40mg  twice daily to 40 mg 3 times daily for 3 days, patient expressed understanding.  Feel that patient's presentation is most consistent with volume overload with venous stasis ulcer, however patient does have some honey colored crusting on his wounds concerning for possible developing impetigo, therefore we will treat with course of Keflex .  Patient expressed understanding, is comfortable this plan, encourage patient to keep his outpatient follow-up with wound care.  Presentation is most consistent with acute complicated illness and Current presentation is complicated by underlying chronic conditions  Discussion of management or test interpretations with external  provider(s): Not indicated  Risk Drugs:Prescription drug management  Disposition: DISCHARGE: I believe that the patient is safe for discharge home with outpatient follow-up. Patient was informed of all pertinent physical exam, laboratory, and imaging findings.  Patient's suspected etiology of their symptom presentation was discussed with the patient and all questions were answered. We discussed following up with PCP, wound care. I provided thorough ED return precautions. The patient feels safe and comfortable with this plan.  MDM generated using voice dictation software and may contain dictation errors.  Please contact me for any clarification or with any questions.  Clinical Impression:  1. Venous stasis dermatitis      Discharge   Final Clinical Impression(s) / ED Diagnoses Final diagnoses:  Venous stasis dermatitis    Rx / DC Orders ED Discharge Orders          Ordered    furosemide  (LASIX ) 40 MG tablet  3 times daily with meals        05/22/24 1324    cephALEXin  (KEFLEX ) 250 MG capsule  2 times daily        05/22/24 1324             Rogelia Jerilynn RAMAN, MD 05/22/24 1623  "

## 2024-05-24 ENCOUNTER — Ambulatory Visit (HOSPITAL_BASED_OUTPATIENT_CLINIC_OR_DEPARTMENT_OTHER): Admitting: Family

## 2024-05-24 ENCOUNTER — Encounter (HOSPITAL_BASED_OUTPATIENT_CLINIC_OR_DEPARTMENT_OTHER): Payer: Self-pay | Admitting: Family

## 2024-05-24 VITALS — BP 99/62 | HR 73 | Ht 70.0 in | Wt 231.0 lb

## 2024-05-24 DIAGNOSIS — I4819 Other persistent atrial fibrillation: Secondary | ICD-10-CM | POA: Diagnosis not present

## 2024-05-24 DIAGNOSIS — R600 Localized edema: Secondary | ICD-10-CM

## 2024-05-24 DIAGNOSIS — D6859 Other primary thrombophilia: Secondary | ICD-10-CM | POA: Diagnosis not present

## 2024-05-24 DIAGNOSIS — I10 Essential (primary) hypertension: Secondary | ICD-10-CM | POA: Diagnosis not present

## 2024-05-24 DIAGNOSIS — R0609 Other forms of dyspnea: Secondary | ICD-10-CM | POA: Diagnosis not present

## 2024-05-24 DIAGNOSIS — I2510 Atherosclerotic heart disease of native coronary artery without angina pectoris: Secondary | ICD-10-CM | POA: Diagnosis not present

## 2024-05-24 NOTE — Progress Notes (Signed)
 "  [facility] Initial Assessment:    Date:  05/29/2024   ID:  Christopher Wade, DOB 07-21-1950, MRN [id number]  PCP:  Christopher Philippe JONELLE, NP  Cardiologist:  Christopher Bihari, MD  Nephrologist:  Referring MD: Christopher Philippe JONELLE, NP   CC: Hypertension  History of Present Illness:    Christopher Wade is a 73 y.o. male with a hx of persistent atrial fib s/p ablation 03/2017 (Amiodarone  previously stopped due to early lung toxicity), HTN, HLD, DM2, CAD (s/p 2017 PCI-LCx/OM), bilateral carotid stenosis (03/2020 and 06/2022 bilateral 1-39% stenosis and R subclavian stenosis). Here to establish care in the [facility].   Prior echo 07/01/23 LVEF 60%, normal diastolic parameters, mild LVH, mildly dilated RV with normal RVSF, mild LAE, mild PAH.  Cardiac PET 08/03/23 no ischemia, no infarction, normal LVEF 59%. Global myocardial blood flow reserve 1.43 which was abnormal.   Prior eval by pulmonary 11/2023 with dyspnea multifactorial: deconditioning, diastolic dysfunction, elevated right hemidiaphragm, UIP related ot construction work, >12 months Amiodarone  (stopped 2019).   He was referred by his PCP 02/2024 for BP 138/80 despite Furosemide  40mg  daily, Amlodipine  10mg  daily, Carvedilol  12.5mg  BID, and Olmesartan  40mg  daily , here to establish with [facility].SABRA   At visit 05/02/24 with Dr. Bihari he had marked LE edema and was sent to ED. He had pitting edema, bilateral stasis dermatisis. No cellulitis noted. He was given Lasix  40mg  IV.  Repeat ED visit 05/22/24 with venous stasis, LE edema. He had elevated BP, home Amlodipine  given. LE duplex with no DVT. BNP elevated at 758. He was given IV Lasix . Home dose Lasix  40mg  increased to TID x 3 days then return to 40mg  BID. He was given course of Keflex .   Seeing wound care upcoming 05/31/24.  Presents today for follow up with family member. Notes started colchicine  daily in November at direction of his  podiatrist. After ED visit no longer taking Colchicine  daily. He notes exertional dyspnea ongoing. Has to limit mobility as cannot walk far without having to sit down. When he first lays down at night will feel short of breath but quickly improves. No orthopnea. Since increasing Lasix  dose note prompt diuretic response with good urinary output. Some issues with incontinence for which he wears disposable brief. BP at home when checked usually 130s/70s.   Previously recommended for in lab sleep study by Dr. Bihari, notes he cancelled sleep study due to GI issues and diarrhea - after changes by Dr. Avram of GI this has somewhat improved.   The majority of our visit today was spent addressing his and family members concern regarding LE edema, not hypertension.   Previous antihypertensives:   Past Medical History:  Diagnosis Date   AKI (acute kidney injury) 05/27/2020   Arthritis    joints get stiff (06/11/2016)   Bee sting allergy    Bee sting allergy    CAD (coronary artery disease), native coronary artery 02/12/2016   cath showing 50% LM, 40% distal LAD, 70% ost to prox LCs, 40% mid LAD and 95% mid to dist RCA s/p PCI with DES x 2 and stage PCI of the LCx/OM.   Carotid artery stenosis    1-39% bilateral carotid stenosis by dopples 06/2022   Difficult airway 11/30/2023   Duodenal ulcer    History of gout    Hx of adenomatous colonic polyps 04/10/2008   Hyperlipidemia    Hypertension    Hypothyroidism    OSA (obstructive sleep apnea)  tried CPAP; couldn't do it (06/11/2016)   Persistent atrial fibrillation (HCC)    s/p DCCV 03/2015 with reversion back to atrial fibrillation, loaded with Amio and s/p DCCV to NSR 11/2015.  He is now s/p afib albation.  He had amio lung toxicity and amio was stopped.   Subclavian artery stenosis    right subclavian artery stenosis by dopplers 2/24   Thyroid  disease    Type II diabetes mellitus (HCC)    Vitamin D  deficiency     Past Surgical  History:  Procedure Laterality Date   BIOPSY  05/28/2020   Procedure: BIOPSY;  Surgeon: Albertus Gordy HERO, MD;  Location: [facility] ENDOSCOPY;  Service: Gastroenterology;;   BIOPSY OF SKIN SUBCUTANEOUS TISSUE AND/OR MUCOUS MEMBRANE  03/16/2024   Procedure: BIOPSY, SKIN, SUBCUTANEOUS TISSUE, OR MUCOUS MEMBRANE;  Surgeon: Avram Lupita BRAVO, MD;  Location: [facility] ENDOSCOPY;  Service: Gastroenterology;;   CARDIAC CATHETERIZATION N/A 10/11/2015   Procedure: Left Heart Cath and Coronary Angiography;  Surgeon: Maude JAYSON Emmer, MD;  Location: [facility] INVASIVE CV LAB;  Service: Cardiovascular;  Laterality: N/A;   CARDIAC CATHETERIZATION N/A 10/11/2015   Procedure: Coronary Stent Intervention;  Surgeon: Peter M Jordan, MD;  Location: [facility] INVASIVE CV LAB;  Service: Cardiovascular;  Laterality: N/A;   CARDIAC CATHETERIZATION N/A 10/11/2015   Procedure: Intravascular Pressure Wire/FFR Study;  Surgeon: Peter M Jordan, MD;  Location: [facility] INVASIVE CV LAB;  Service: Cardiovascular;  Laterality: N/A;   CARDIAC CATHETERIZATION N/A 10/14/2015   Procedure: Coronary Stent Intervention;  Surgeon: Candyce GORMAN Reek, MD;  Location: [facility] INVASIVE CV LAB;  Service: Cardiovascular;  Laterality: N/A;   CARDIOVERSION N/A 04/05/2015   Procedure: CARDIOVERSION;  Surgeon: Christopher Wade Bihari, MD;  Location: [facility] ENDOSCOPY;  Service: Cardiovascular;  Laterality: N/A;   CARDIOVERSION N/A 12/09/2015   Procedure: CARDIOVERSION;  Surgeon: Christopher Wade Bihari, MD;  Location: [facility] ENDOSCOPY;  Service: Cardiovascular;  Laterality: N/A;   CARDIOVERSION N/A 03/16/2016   Procedure: CARDIOVERSION;  Surgeon: [name] Scarce, MD;  Location: [facility] ENDOSCOPY;  Service: Cardiovascular;  Laterality: N/A;   CATARACT EXTRACTION Right 2019   Dr. Jens   COLONOSCOPY N/A 03/16/2024   Procedure: COLONOSCOPY;  Surgeon: Avram Lupita BRAVO, MD;  Location: [facility] ENDOSCOPY;  Service: Gastroenterology;  Laterality: N/A;   CORONARY ANGIOPLASTY     ELECTROPHYSIOLOGIC STUDY N/A 06/11/2016   Procedure: Atrial  Fibrillation Ablation;  Surgeon: Will Gladis Norton, MD;  Location: [facility] INVASIVE CV LAB;  Service: Cardiovascular;  Laterality: N/A;   ESOPHAGOGASTRODUODENOSCOPY N/A 03/16/2024   Procedure: EGD (ESOPHAGOGASTRODUODENOSCOPY);  Surgeon: Avram Lupita BRAVO, MD;  Location: [facility] ENDOSCOPY;  Service: Gastroenterology;  Laterality: N/A;   ESOPHAGOGASTRODUODENOSCOPY (EGD) WITH PROPOFOL  N/A 05/28/2020   Procedure: ESOPHAGOGASTRODUODENOSCOPY (EGD) WITH PROPOFOL ;  Surgeon: Albertus Gordy HERO, MD;  Location: [facility] ENDOSCOPY;  Service: Gastroenterology;  Laterality: N/A;   POLYPECTOMY  03/16/2024   Procedure: POLYPECTOMY, INTESTINE;  Surgeon: Avram Lupita BRAVO, MD;  Location: [facility] ENDOSCOPY;  Service: Gastroenterology;;   TOTAL HIP ARTHROPLASTY Bilateral 2001   UVULECTOMY      Current Medications: Active Medications[1]   Allergies:   Amiodarone  and Bee venom   Social History   Socioeconomic History   Marital status: Single    Spouse name: Not on file   Number of children: 0   Years of education: Not on file   Highest education level: High school graduate  Occupational History   Occupation: retired  Tobacco Use   Smoking status: Never   Smokeless tobacco: Never  Vaping Use   Vaping status:  Never Used  Substance and Sexual Activity   Alcohol use: No   Drug use: No   Sexual activity: Not Currently  Other Topics Concern   Not on file  Social History Narrative   Not on file   Social Drivers of Health   Tobacco Use: Low Risk (05/24/2024)   Patient History    Smoking Tobacco Use: Never    Smokeless Tobacco Use: Never    Passive Exposure: Not on file  Financial Resource Strain: Low Risk (12/14/2023)   Overall Financial Resource Strain (CARDIA)    Difficulty of Paying Living Expenses: Not hard at all  Food Insecurity: No Food Insecurity (12/14/2023)   Epic    Worried About Radiation Protection Practitioner of Food in the Last Year: Never true    Ran Out of Food in the Last Year: Never true  Transportation Needs: No  Transportation Needs (12/14/2023)   Epic    Lack of Transportation (Medical): No    Lack of Transportation (Non-Medical): No  Physical Activity: Inactive (12/14/2023)   Exercise Vital Sign    Days of Exercise per Week: 0 days    Minutes of Exercise per Session: 0 min  Stress: No Stress Concern Present (12/14/2023)   [employer] of Occupational Health - Occupational Stress Questionnaire    Feeling of Stress: Not at all  Social Connections: Moderately Isolated (12/14/2023)   Social Connection and Isolation Panel    Frequency of Communication with Friends and Family: Twice a week    Frequency of Social Gatherings with Friends and Family: Once a week    Attends Religious Services: 1 to 4 times per year    Active Member of Clubs or Organizations: No    Attends Banker Meetings: Never    Marital Status: Never married  Depression (PHQ2-9): Low Risk (03/14/2024)   Depression (PHQ2-9)    PHQ-2 Score: 0  Alcohol Screen: Low Risk (09/13/2023)   Alcohol Screen    Last Alcohol Screening Score (AUDIT): 0  Housing: Low Risk (12/14/2023)   Epic    Unable to Pay for Housing in the Last Year: No    Number of Times Moved in the Last Year: 0    Homeless in the Last Year: No  Utilities: Not At Risk (12/14/2023)   Epic    Threatened with loss of utilities: No  Health Literacy: Adequate Health Literacy (12/14/2023)   B1300 Health Literacy    Frequency of need for help with medical instructions: Never     Family History: The patient's family history includes Colon cancer in his sister; Diabetes in his sister; Liver disease in his brother, father, mother, and sister; Lung cancer in his father and mother; Prostate cancer in his brother. There is no history of Stomach cancer, Pancreatic cancer, or Esophageal cancer.  ROS:   Please see the history of present illness.     All other systems reviewed and are negative.  EKGs/Labs/Other Studies Reviewed:         Recent Labs: 06/10/2023:  BNP 81.8 03/14/2024: TSH 1.81 05/02/2024: ALT 17 05/22/2024: BUN 12; Creatinine, Ser 0.94; Hemoglobin 14.5; Platelets 271; Potassium 3.6; Pro Brain Natriuretic Peptide 758.0; Sodium 139   Recent Lipid Panel    Component Value Date/Time   CHOL 114 03/14/2024 1410   TRIG 223.0 (H) 03/14/2024 1410   HDL 32.60 (L) 03/14/2024 1410   CHOLHDL 3 03/14/2024 1410   VLDL 44.6 (H) 03/14/2024 1410   LDLCALC 37 03/14/2024 1410   LDLCALC 56 03/17/2023 1505  Physical Exam:   VS:  BP 99/62 (BP Location: Left Arm)   Pulse 73   Ht 5' 10 (1.778 m)   Wt 231 lb (104.8 kg)   SpO2 95%   BMI 33.15 kg/m  , BMI Body mass index is 33.15 kg/m. GENERAL:  Well appearing, overweight HEENT: Pupils equal round and reactive, fundi not visualized, oral mucosa unremarkable NECK:  No jugular venous distention, waveform within normal limits, carotid upstroke brisk and symmetric, no bruits, no thyromegaly LYMPHATICS:  No cervical adenopathy LUNGS:  Clear to auscultation bilaterally HEART:  RRR.  PMI not displaced or sustained,S1 and S2 within normal limits, no S3, no S4, no clicks, no rubs, no murmurs ABD:  Flat, positive bowel sounds normal in frequency in pitch, no bruits, no rebound, no guarding, no midline pulsatile mass, no hepatomegaly, no splenomegaly EXT:  2 plus pulses throughout, bilateral LE with 1+ pitting edema, no cyanosis no clubbing SKIN:  No rashes no nodules NEURO:  Cranial nerves II through XII grossly intact, motor grossly intact throughout PSYCH:  Cognitively intact, oriented to person place and time   ASSESSMENT/PLAN:    HTN - hypotensive today but asymptomatic with no lightheadedness. Home BP's routinely 130s. Anticipate lower BP due to recently increased diuretic.  Due to persistent difficulties with LE edema, stop Amlodipine  Continue Coreg  12.5mg   BID, Lasix  40mg  BID, Olmesartan  40mg  daily Discussed addition of Spironolactone when completed increased dose Lasix , he and family politely  decline medication changes today. Could consider at follow up. If unable to utilize Spiro due to renal function, could consider Hydralazine . Discussed to monitor BP at home at least 2 hours after medications and sitting for 5-10 minutes.  Secondary workup OSA - encouraged to reschedule in lab sleep study as recommended by Dr. Shlomo Known mild bilateral carotid stenosis by dopplers 06/2022. Not addressed at his clinic visit. Consider repeat carotid dopplers at follow up 10/2023 CT normal adrenals 02/2024 normal TSH Consider plasma renin-aldosterone ratio at next [facility] visit  LE edema / venous insufficiency / Bilateral LE cellulitis - Bilateral LE redness improved with Keflex . Now with bilateral 1+ pitting edema, improved from previous. No erythema. Continue increased dose Lasix  today then return to one tablet 40mg  BID as previously recommended.  Establish with wound clinic as scheduled next week If wound persistently difficult to heal, consider ABI. Will defer to wound clinic regarding timing. He does have bilateral palpable PT pulses.  Update echocardiogram due to elevated BNP, LE edema Leg elevation encouraged.   Persistent atrial fibrillation s/p ablation / Hypercoagulable state - RRR by auscultation. Denies bleeding complications. Continue Coumadin , Coreg  12.5mg  BID.   CAD / HLD, LDL goal <70 - Stable with no anginal symptoms. No indication for ischemic evaluation.  GDMT atorvastatin  80mg  daily, coreg  12.5mg  BID. No ASA due to OAC.   Screening for Secondary Hypertension:     Relevant Labs/Studies:    Latest Ref Rng & Units 05/22/2024   11:09 AM 05/02/2024    2:26 PM 03/14/2024    2:10 PM  Basic Labs  Sodium 135 - 145 mmol/L 139  139  138   Potassium 3.5 - 5.1 mmol/L 3.6  4.2  3.5   Creatinine 0.61 - 1.24 mg/dL 9.05  8.70  8.40        Latest Ref Rng & Units 03/14/2024    2:10 PM 12/13/2023    1:48 PM  Thyroid    TSH 0.35 - 5.50 uIU/mL 1.81  1.80  Disposition:    FU with Orren Fabry, PA as scheduled and with [facility] (MD/APP/PharmD) in 2-3 months   Medication Adjustments/Labs and Tests Ordered: Current medicines are reviewed at length with the patient today.  Concerns regarding medicines are outlined above.  Orders Placed This Encounter  Procedures   ECHOCARDIOGRAM COMPLETE   No orders of the defined types were placed in this encounter.    Signed, Reche GORMAN Finder, NP  05/29/2024 8:58 AM    [facility]     [1]  Current Meds  Medication Sig   acetaminophen  (TYLENOL ) 325 MG tablet Take 325 mg by mouth as needed for mild pain.   allopurinol  (ZYLOPRIM ) 300 MG tablet TAKE 1 TABLET BY MOUTH EVERY DAY   Ascorbic Acid (VITAMIN C WITH ROSE HIPS) 500 MG tablet Take 500 mg by mouth daily.   atorvastatin  (LIPITOR ) 80 MG tablet TAKE 1/2 TABLET BY MOUTH DAILY   B Complex Vitamins (B COMPLEX PO) Take 1 tablet by mouth daily.   carvedilol  (COREG ) 12.5 MG tablet TAKE 1 TABLET (12.5MG  TOTAL) BY MOUTH TWICE A DAY WITH MEALS   cephALEXin  (KEFLEX ) 250 MG capsule Take 2 capsules (500 mg total) by mouth 2 (two) times daily.   cholecalciferol  (VITAMIN D ) 1000 UNITS tablet Take 1,000 Units by mouth daily.   Ferrous Sulfate (IRON ) 325 (65 Fe) MG TABS Take 1 tablet (325 mg total) by mouth every other day.   Fluticasone-Umeclidin-Vilant (TRELEGY ELLIPTA ) 100-62.5-25 MCG/ACT AEPB Inhale 100 mcg into the lungs daily.   furosemide  (LASIX ) 40 MG tablet Take 1 tablet (40 mg total) by mouth with breakfast, with lunch, and with evening meal.   levothyroxine  (SYNTHROID ) 100 MCG tablet Take  1 tablet  Daily  on an empty stomach with only water for 30 minutes & no Antacid meds, Calcium  or Magnesium  for 4 hours & avoid Biotin                                                                                 /                                         TAKE                                                     BY                                                   MOUTH   loratadine  (CLARITIN ) 10 MG tablet Take 10 mg by mouth daily.   metFORMIN  (GLUCOPHAGE -XR) 500 MG 24 hr tablet TAKE 1 TABLETS IN THE MORNING AND 2 TABLETS IN THE EVENING.   olmesartan  (BENICAR ) 40 MG tablet TAKE 1 TABLET (40 MG TOTAL) BY MOUTH DAILY FOR  BLOOD PRESSURE   Omega-3 Fatty Acids (FISH OIL) 1200 MG CAPS Take 1,200 mg by mouth daily.   pantoprazole  (PROTONIX ) 40 MG tablet TAKE 1 TABLET BY MOUTH EVERY DAY BEFORE BREAKFAST   potassium chloride  SA (KLOR-CON  M) 20 MEQ tablet 2TABS BY MOUTH DAILY X1DAY,THEN 1 TAB AS NEED WHEN YOU TAKE LASIX  FOR FLUID RETENTION/ANKLE SWELLING   warfarin (COUMADIN ) 5 MG tablet TAKE 1 TABLET BY MOUTH DAILY. EXCEPT 1 AND 1/2 TABS ON MONDAYS OR AS DIRECTED BY COAGULATION CLINIC   zinc gluconate 50 MG tablet Take 50 mg by mouth daily.   [DISCONTINUED] amLODipine  (NORVASC ) 10 MG tablet TAKE 1 TABLET BY MOUTH EVERY DAY FOR BLOOD PRESSURE AND HEART   "

## 2024-05-24 NOTE — Patient Instructions (Addendum)
 Medication Instructions:   STOP Amlodipine  If this is making your swelling worse, it will take 3-4 weeks for us  to see improvement after stopping this medication   When you get home: Take Furosemide  2 tablets (40mg )  TOMORROW: Change Furosemide  to 1 tablet (40mg ) twice per day  Testing/Procedures: Your physician has requested that you have an echocardiogram. This it to help make sure there is not a change in your heart muscle function making your swelling worse. Echocardiography is a painless test that uses sound waves to create images of your heart. It provides your doctor with information about the size and shape of your heart and how well your hearts chambers and valves are working. This procedure takes approximately one hour. There are no restrictions for this procedure. Please do NOT wear cologne, perfume, aftershave, or lotions (deodorant is allowed). Please arrive 15 minutes prior to your appointment time.  Please note: We ask at that you not bring children with you during ultrasound (echo/ vascular) testing. Due to room size and safety concerns, children are not allowed in the ultrasound rooms during exams. Our front office staff cannot provide observation of children in our lobby area while testing is being conducted. An adult accompanying a patient to their appointment will only be allowed in the ultrasound room at the discretion of the ultrasound technician under special circumstances. We apologize for any inconvenience.   Follow-Up: As scheduled with Orren Fabry, PA on 06/02/24.   Please follow up in 2-3 months in [facility] with Dr. Raford, Reche Finder, NP or Allean Mink PharmD    Special Instructions:    Reche GORMAN Finder, NP will reach out to wound clinic about the best way to look at the blood flow in your legs.   To prevent or reduce lower extremity swelling: Eat a low salt diet. Salt makes the body hold onto extra fluid which causes swelling. Sit with legs  elevated. For example, in the recliner or on an ottoman.  Wear knee-high compression stockings during the daytime. Ones labeled 15-20 mmHg provide good compression.   Check BP at home once per day and keep a log. Bring blood pressure log and your blood pressure cuff to your office visit 06/02/24.

## 2024-05-30 ENCOUNTER — Ambulatory Visit: Admitting: Pharmacist

## 2024-05-30 DIAGNOSIS — I4819 Other persistent atrial fibrillation: Secondary | ICD-10-CM

## 2024-05-30 DIAGNOSIS — Z5181 Encounter for therapeutic drug level monitoring: Secondary | ICD-10-CM | POA: Diagnosis not present

## 2024-05-30 LAB — POCT INR: INR: 2.2 (ref 2.0–3.0)

## 2024-05-30 NOTE — Progress Notes (Signed)
 Description   INR-2.2; Continue taking warfarin 1.5 tablets daily except 1 tablet on Monday, Thursday, and Saturday. Stay consistent with greens each week (1-2 times per week). Recheck INR in 6 weeks.  Coumadin  Clinic: [phone] Fax number [phone]

## 2024-05-30 NOTE — Patient Instructions (Signed)
 Description   INR-2.2; Continue taking warfarin 1.5 tablets daily except 1 tablet on Monday, Thursday, and Saturday. Stay consistent with greens each week (1-2 times per week). Recheck INR in 6 weeks.  Coumadin  Clinic: [phone] Fax number [phone]

## 2024-05-31 ENCOUNTER — Encounter (HOSPITAL_BASED_OUTPATIENT_CLINIC_OR_DEPARTMENT_OTHER): Attending: General Surgery | Admitting: General Surgery

## 2024-05-31 DIAGNOSIS — L97812 Non-pressure chronic ulcer of other part of right lower leg with fat layer exposed: Secondary | ICD-10-CM | POA: Insufficient documentation

## 2024-05-31 DIAGNOSIS — E11622 Type 2 diabetes mellitus with other skin ulcer: Secondary | ICD-10-CM | POA: Insufficient documentation

## 2024-05-31 DIAGNOSIS — I890 Lymphedema, not elsewhere classified: Secondary | ICD-10-CM | POA: Insufficient documentation

## 2024-06-01 NOTE — Progress Notes (Signed)
 " Cardiology Office Note   Date:  06/02/2024  ID:  Christopher Wade, DOB Sep 29, 1950, MRN [id number] PCP: [name], NP  [facility] Providers Cardiologist:  [name], MD Electrophysiologist:  [name], MD   History of Present Illness Christopher Wade is a 74 y.o. male with a past medical history of persistent atrial fibrillation status post ablation 03/2017 (amiodarone  previously stopped due to early lung toxicity) he, HTN, HLD, DM2, CAD (status post 2017 PCI LCx/OM), bilateral carotid stenosis (04/13/2020 and 07/14/2022 bilateral 1 to 39% stenosis and right subclavian stenosis) he.  He presents today for follow-up.  He was seen a year ago in the advanced hypertension clinic.  Prior echo 07/01/2023 showing LVEF 60%, normal diastolic parameters, mild LVH, mildly dilated RV and normal RV SF, mild LAE, mild PAH.  Cardiac PET 08/03/2023 with no ischemia, no infarction, normal LVEF 59%.  Global myocardial blood flow reserve 1.43 which was abnormal.  Prior eval by pulmonary 11/2023 with dyspnea multifactorial: Deconditioning, diastolic dysfunction, elevated right hemidiaphragm, UIP related to construction work, greater than 12 months amiodarone  (stopped 2019).  Wound care appointment 05/31/2024.  Was seen 05/24/2024 for follow-up in the HTN clinic.  Started on colchicine  daily in November at the direction of his podiatrist.  After ED visit no longer taking colchicine  daily.  Noted exertional dyspnea which was ongoing.  Has limited his mobility and is cannot walk far without having to sit down.  When he first lays down at night will feel short of breath but quickly improves.  No orthopnea.  Since increasing Lasix  dose notes prompt diuretic response with good urinary output.  Some issues with incontinence for which she wears disposable briefs.  BP at home when checked is usually 130s over 70s.  Previously seen by Dr. Bihari for OSA.  Sleep study was canceled due to GI issues and  diarrhea and saw GI and this is somewhat improved.  The majority of that visit was spent addressing his and family members concerns for lower extremity edema and not hypertension.  Today, he presents with a hx of leg swelling and cellulitis for follow-up on his leg condition and blood pressure management. He was referred by Dr. Bihari to the ER due to the severity of his leg condition when she saw him last.  He has had persistent leg swelling and cellulitis since December and receives regular wound care with leg wraps. Swelling has improved but remains present, and he needs ongoing close follow-up for this.  Colchicine  previously worsened his leg swelling and was stopped. Amlodipine  was also stopped because it was thought to aggravate his edema. He is not on antibiotics for his legs at present and continues with wound care.  He has atrial fibrillation treated with ablation in 2019 and has not had significant AFib symptoms since. Amiodarone  was stopped previously due to lung toxicity.  He takes Lasix  40 mg twice daily for fluid retention. He has frequent nocturnal urination and is considering changing the timing of doses. He keeps a home blood pressure log and notes variable readings.  He has shortness of breath with walking and exertion. An echocardiogram is scheduled for January 30th to evaluate this.  He remains on warfarin for anticoagulation and follows dietary and medication interaction guidance as instructed.  Reports no shortness of breath nor dyspnea on exertion. Reports no chest pain, pressure, or tightness. No edema, orthopnea, PND. Reports no palpitations.   Discussed the use of AI scribe software for clinical note  transcription with the patient, who gave verbal consent to proceed.   ROS: pertinent ROS in HPI  Studies Reviewed     Echo 06/2023 IMPRESSIONS     1. Left ventricular ejection fraction, by estimation, is 60%. Left  ventricular ejection fraction by 3D volume is 61 %.  The left ventricle has  normal function. The left ventricle has no regional wall motion  abnormalities. There is mild concentric left  ventricular hypertrophy. Left ventricular diastolic parameters were  normal.   2. Right ventricular systolic function is normal. The right ventricular  size is dilated. There is mildly elevated pulmonary artery systolic  pressure.   3. Left atrial size was mildly dilated.   4. The mitral valve is normal in structure. No evidence of mitral valve  regurgitation. No evidence of mitral stenosis.   5. The aortic valve is tricuspid. Aortic valve regurgitation is not  visualized. No aortic stenosis is present.   6. Dilated pulmonary artery.   7. The inferior vena cava is normal in size with greater than 50%  respiratory variability, suggesting right atrial pressure of 3 mmHg.   Risk Assessment/Calculations  CHA2DS2-VASc Score = 4  This indicates a 4.8% annual risk of stroke. The patient's score is based upon: CHF History: 0 HTN History: 1 Diabetes History: 1 Stroke History: 0 Vascular Disease History: 1 Age Score: 1 Gender Score: 0  Physical Exam VS:  BP (!) 154/76   Pulse 67   Ht 5' 10 (1.778 m)   Wt 239 lb (108.4 kg)   SpO2 91%   BMI 34.29 kg/m        Wt Readings from Last 3 Encounters:  06/02/24 239 lb (108.4 kg)  05/24/24 231 lb (104.8 kg)  05/11/24 227 lb (103 kg)    GEN: Well nourished, well developed in no acute distress NECK: No JVD; No carotid bruits CARDIAC: RRR, no murmurs, rubs, gallops RESPIRATORY:  Clear to auscultation without rales, wheezing or rhonchi  ABDOMEN: Soft, non-tender, non-distended EXTREMITIES:  No edema; No deformity   ASSESSMENT AND PLAN  Bilateral lower extremity edema with cellulitis Chronic edema with cellulitis exacerbated by colchicine . Managed with wound care and high-dose Lasix . No antibiotics per wound clinic. Improvement noted. - Continue wound care follow-up on January 16th, 23rd, and 29th. -  Adjust Lasix  dosing to morning and lunchtime to manage nocturia. - Consider spironolactone once Lasix  dose is reduced and leg issues resolve for maintenance of volume and htn  Persistent atrial fibrillation Managed with ablation since 2019. No recent episodes. Amiodarone  discontinued due to adverse effects. On warfarin for anticoagulation with SE of diarrhea (along with other medications he is taking, lasix  and metformin ). Discussed loop recorder placement to potentially discontinue warfarin, pending insurance approval. - Follow up with Dr. Inocencio to discuss loop recorder placement and potential discontinuation of warfarin. - Continue current anticoagulation therapy with warfarin.  Essential hypertension Hypertension with recent fluctuations. High-dose Lasix  may affect blood pressure control. Amlodipine  discontinued due to leg swelling. Blood pressure management to be reassessed post-resolution of leg issues and Lasix  adjustment. - Monitor blood pressure and adjust medications as needed once leg issues resolve and Lasix  dose is reduced. - Consider adding spironolactone for blood pressure management once Lasix  dose is reduced.      Dispo: He can follow-up with Caitlin as scheduled, arranged f/u with Dr. Walton to discuss loop recorder.   Signed, Orren LOISE Fabry, PA-C   "

## 2024-06-02 ENCOUNTER — Encounter: Payer: Self-pay | Admitting: Physician Assistant

## 2024-06-02 ENCOUNTER — Ambulatory Visit: Attending: Physician Assistant | Admitting: Physician Assistant

## 2024-06-02 VITALS — BP 154/76 | HR 67 | Ht 70.0 in | Wt 239.0 lb

## 2024-06-02 DIAGNOSIS — E785 Hyperlipidemia, unspecified: Secondary | ICD-10-CM | POA: Diagnosis not present

## 2024-06-02 DIAGNOSIS — Z5181 Encounter for therapeutic drug level monitoring: Secondary | ICD-10-CM

## 2024-06-02 DIAGNOSIS — R0602 Shortness of breath: Secondary | ICD-10-CM

## 2024-06-02 DIAGNOSIS — I10 Essential (primary) hypertension: Secondary | ICD-10-CM | POA: Diagnosis not present

## 2024-06-02 DIAGNOSIS — I2510 Atherosclerotic heart disease of native coronary artery without angina pectoris: Secondary | ICD-10-CM | POA: Diagnosis not present

## 2024-06-02 DIAGNOSIS — R600 Localized edema: Secondary | ICD-10-CM

## 2024-06-02 DIAGNOSIS — R0609 Other forms of dyspnea: Secondary | ICD-10-CM | POA: Diagnosis not present

## 2024-06-02 DIAGNOSIS — I4819 Other persistent atrial fibrillation: Secondary | ICD-10-CM | POA: Diagnosis not present

## 2024-06-02 NOTE — Patient Instructions (Signed)
 Medication Instructions:  Your physician recommends that you continue on your current medications as directed. Please refer to the Current Medication list given to you today. *If you need a refill on your cardiac medications before your next appointment, please call your pharmacy*  Lab Work: NONE ORDERED If you have labs (blood work) drawn today and your tests are completely normal, you will receive your results only by: MyChart Message (if you have MyChart) OR A paper copy in the mail If you have any lab test that is abnormal or we need to change your treatment, we will call you to review the results.  Testing/Procedures: NONE ORDERED  Follow-Up: At [facility], you and your health needs are our priority.  As part of our continuing mission to provide you with exceptional heart care, our providers are all part of one team.  This team includes your primary Cardiologist (physician) and Advanced Practice Providers or APPs (Physician Assistants and Nurse Practitioners) who all work together to provide you with the care you need, when you need it.  Your next appointment:   FIRST AVAILABLE  TO DISCUSS LOOP RECORDER  Provider:   You may see Will Gladis Norton, MD or one of the following Advanced Practice Providers on your designated Care Team:   Charlies Arthur, PA-C Michael Andy Tillery, PA-C Suzann Riddle, NP Daphne Barrack, NP Artist Pouch, PA-C   We recommend signing up for the patient portal called MyChart.  Sign up information is provided on this After Visit Summary.  MyChart is used to connect with patients for Virtual Visits (Telemedicine).  Patients are able to view lab/test results, encounter notes, upcoming appointments, etc.  Non-urgent messages can be sent to your provider as well.   To learn more about what you can do with MyChart, go to [email or web address].   Other Instructions BRING YOUR BLOOD PRESSURE WITH YOU TO YOUR NEXT APPOINTMENT  Please check your blood  pressure daily for two weeks, then contact the office with your readings  Please contact the office with your readings either by phone, by dropping it off in person, or by sending it through MyChart.   Be sure to check your blood pressure one to two hours after taking your medications.  Avoid the following for 30 minutes before checking your blood pressure: No caffeine No alcohol No eating No smoking  No exercise  Five minutes before checking your blood pressure: Use the restroom Sit up straight in a chair with your back supported and feet flat on the floor Remain quiet and do not talk

## 2024-06-04 ENCOUNTER — Emergency Department (HOSPITAL_COMMUNITY)
Admission: EM | Admit: 2024-06-04 | Discharge: 2024-06-05 | Disposition: A | Attending: Emergency Medicine | Admitting: Emergency Medicine

## 2024-06-04 ENCOUNTER — Emergency Department (HOSPITAL_COMMUNITY)

## 2024-06-04 ENCOUNTER — Encounter (HOSPITAL_COMMUNITY): Payer: Self-pay | Admitting: Emergency Medicine

## 2024-06-04 ENCOUNTER — Other Ambulatory Visit: Payer: Self-pay

## 2024-06-04 DIAGNOSIS — Z7901 Long term (current) use of anticoagulants: Secondary | ICD-10-CM | POA: Insufficient documentation

## 2024-06-04 DIAGNOSIS — E119 Type 2 diabetes mellitus without complications: Secondary | ICD-10-CM | POA: Insufficient documentation

## 2024-06-04 DIAGNOSIS — Z79890 Hormone replacement therapy (postmenopausal): Secondary | ICD-10-CM | POA: Diagnosis not present

## 2024-06-04 DIAGNOSIS — R1032 Left lower quadrant pain: Secondary | ICD-10-CM | POA: Insufficient documentation

## 2024-06-04 DIAGNOSIS — Z7984 Long term (current) use of oral hypoglycemic drugs: Secondary | ICD-10-CM | POA: Insufficient documentation

## 2024-06-04 DIAGNOSIS — I2510 Atherosclerotic heart disease of native coronary artery without angina pectoris: Secondary | ICD-10-CM | POA: Diagnosis not present

## 2024-06-04 DIAGNOSIS — E039 Hypothyroidism, unspecified: Secondary | ICD-10-CM | POA: Diagnosis not present

## 2024-06-04 DIAGNOSIS — I10 Essential (primary) hypertension: Secondary | ICD-10-CM | POA: Diagnosis not present

## 2024-06-04 DIAGNOSIS — I158 Other secondary hypertension: Secondary | ICD-10-CM | POA: Insufficient documentation

## 2024-06-04 DIAGNOSIS — Z79899 Other long term (current) drug therapy: Secondary | ICD-10-CM | POA: Diagnosis not present

## 2024-06-04 DIAGNOSIS — R1030 Lower abdominal pain, unspecified: Secondary | ICD-10-CM | POA: Diagnosis present

## 2024-06-04 LAB — BASIC METABOLIC PANEL WITH GFR
Anion gap: 15 (ref 5–15)
BUN: 13 mg/dL (ref 8–23)
CO2: 25 mmol/L (ref 22–32)
Calcium: 8.9 mg/dL (ref 8.9–10.3)
Chloride: 100 mmol/L (ref 98–111)
Creatinine, Ser: 1.16 mg/dL (ref 0.61–1.24)
GFR, Estimated: >60 mL/min
Glucose, Bld: 154 mg/dL — ABNORMAL HIGH (ref 70–99)
Potassium: 3.6 mmol/L (ref 3.5–5.1)
Sodium: 140 mmol/L (ref 135–145)

## 2024-06-04 LAB — CBC
HCT: 44.3 % (ref 39.0–52.0)
Hemoglobin: 14.9 g/dL (ref 13.0–17.0)
MCH: 34.7 pg — ABNORMAL HIGH (ref 26.0–34.0)
MCHC: 33.6 g/dL (ref 30.0–36.0)
MCV: 103 fL — ABNORMAL HIGH (ref 80.0–100.0)
Platelets: 322 K/uL (ref 150–400)
RBC: 4.3 MIL/uL (ref 4.22–5.81)
RDW: 14.4 % (ref 11.5–15.5)
WBC: 10.4 K/uL (ref 4.0–10.5)
nRBC: 0 % (ref 0.0–0.2)

## 2024-06-04 LAB — URINALYSIS, ROUTINE W REFLEX MICROSCOPIC
Bilirubin Urine: NEGATIVE
Glucose, UA: NEGATIVE mg/dL
Hgb urine dipstick: NEGATIVE
Ketones, ur: NEGATIVE mg/dL
Leukocytes,Ua: NEGATIVE
Nitrite: NEGATIVE
Protein, ur: NEGATIVE mg/dL
Specific Gravity, Urine: 1.012 (ref 1.005–1.030)
pH: 6 (ref 5.0–8.0)

## 2024-06-04 LAB — TROPONIN T, HIGH SENSITIVITY: Troponin T High Sensitivity: 25 ng/L — ABNORMAL HIGH (ref 0–19)

## 2024-06-04 NOTE — ED Notes (Signed)
 Provider indicated that pt can go to the lobby

## 2024-06-04 NOTE — ED Notes (Signed)
 Pt returned to room 14 from X-ray.

## 2024-06-04 NOTE — ED Notes (Signed)
 Patient transported to X-ray

## 2024-06-04 NOTE — ED Triage Notes (Signed)
 Pt here from home with c/o htn , hx of same , no other complaints

## 2024-06-04 NOTE — ED Provider Triage Note (Signed)
 Emergency Medicine Provider Triage Evaluation Note  DERVIN VORE , a 74 y.o. male hx of HTN was evaluated in triage.  Pt complains of weakness and elevated BP.  Review of Systems  Positive:  Negative: Chest pain  Physical Exam  BP (!) 191/88 (BP Location: Right Arm)   Pulse 75   Temp 98.2 F (36.8 C)   Resp 18   SpO2 95%  Gen:   Awake, no distress   Resp:  Normal effort MSK:   Moves extremities without difficulty  Other:    Medical Decision Making  Medically screening exam initiated at 9:44 PM.  Appropriate orders placed.  CADELL GABRIELSON was informed that the remainder of the evaluation will be completed by another provider, this initial triage assessment does not replace that evaluation, and the importance of remaining in the ED until their evaluation is complete.   Yolande Lamar BROCKS, MD 06/04/24 2145

## 2024-06-05 ENCOUNTER — Telehealth: Payer: Self-pay

## 2024-06-05 ENCOUNTER — Emergency Department (HOSPITAL_COMMUNITY)

## 2024-06-05 ENCOUNTER — Encounter (HOSPITAL_COMMUNITY): Payer: Self-pay | Admitting: Emergency Medicine

## 2024-06-05 LAB — PROTIME-INR
INR: 1.7 — ABNORMAL HIGH (ref 0.8–1.2)
Prothrombin Time: 20.8 s — ABNORMAL HIGH (ref 11.4–15.2)

## 2024-06-05 LAB — HEPATIC FUNCTION PANEL
ALT: 12 U/L (ref 0–44)
AST: 17 U/L (ref 15–41)
Albumin: 3.7 g/dL (ref 3.5–5.0)
Alkaline Phosphatase: 89 U/L (ref 38–126)
Bilirubin, Direct: 0.2 mg/dL (ref 0.0–0.2)
Indirect Bilirubin: 0.3 mg/dL (ref 0.3–0.9)
Total Bilirubin: 0.5 mg/dL (ref 0.0–1.2)
Total Protein: 7.2 g/dL (ref 6.5–8.1)

## 2024-06-05 LAB — TROPONIN T, HIGH SENSITIVITY: Troponin T High Sensitivity: 24 ng/L — ABNORMAL HIGH (ref 0–19)

## 2024-06-05 MED ORDER — ACETAMINOPHEN 500 MG PO TABS
1000.0000 mg | ORAL_TABLET | Freq: Once | ORAL | Status: AC
  Administered 2024-06-05: 1000 mg via ORAL
  Filled 2024-06-05: qty 2

## 2024-06-05 MED ORDER — IOHEXOL 350 MG/ML SOLN
80.0000 mL | Freq: Once | INTRAVENOUS | Status: AC | PRN
  Administered 2024-06-05: 80 mL via INTRAVENOUS

## 2024-06-05 MED ORDER — AMLODIPINE BESYLATE 5 MG PO TABS: 5.0000 mg | ORAL_TABLET | Freq: Every day | ORAL | 1 refills | Status: AC

## 2024-06-05 NOTE — Telephone Encounter (Signed)
 Call to patient to advise that Dr. Shlomo recommends he restart Amlodipine  at 5mg  daily and get in with extender in 2 weeks. Appointment made for 06/19/24 with extender. Patient states he will check Bp twice daily x 1 week starting on Wed and call with results in 1 week.

## 2024-06-05 NOTE — ED Notes (Signed)
 Labs drawn twice off of 20 RAC. All samples hemolized. Requesting lab draws next samples.

## 2024-06-05 NOTE — ED Provider Notes (Addendum)
 "  EMERGENCY DEPARTMENT AT [facility] Provider Note  CSN: [id number] Arrival date & time: 06/04/24 2106  Chief Complaint(s) Hypertension  History provided by patient. HPI & MDM Christopher Wade is a 74 y.o. male with a past medical history listed below here for elevated blood pressure.  The history is provided by the patient.   Patient reports that he was in his regular state of health up until 4 PM this afternoon he began having lower abdominal discomfort.  Patient reports that he was dealing with the discomfort which was not concerning to him. Lower abdominal pain is mostly on the left side.  No diarrhea.  No bloody bowel movements.  No urinary symptoms.  No alleviating or aggravating factors.  Patient recently had a colonoscopy and states that he was told everything looks fine.  On my review of the colonoscopy performed on October 2025, and reports patient does have diverticulosis in the sigmoid and ascending colon.  He had 2 sessile polyps removed at that time as well.  This evening during his routine nightly blood pressure checks, he noted his blood pressure was elevated with systolics in the 200s.  At this time he was still having the lower abdominal pain but otherwise asymptomatic without chest pain or shortness of breath.  No headache.  No focal deficits.  EMS was called who confirmed blood pressures.  He was brought in for evaluation.  Patient reports that 2 to 3 weeks ago his amlodipine  was discontinued due to side effect of peripheral edema.  He remained on his other blood pressure medicines and has been compliant with them. Reports that his BP have been ranging higher recently since coming off of amlodipine .    Medical Decision Making Amount and/or Complexity of Data Reviewed Labs: ordered. Decision-making details documented in ED Course. Radiology: ordered and independent interpretation performed. Decision-making details documented in ED Course.  Risk OTC  drugs. Prescription drug management.   DDx considered and work ups below:  HTN Relatively asymptomatic.  Unlikely laded to recent discontinuation of amlodipine . Patient denies any chest pain or shortness of breath.  EKG without acute ischemic changes, dysrhythmias or blocks.  Troponin drawn in triage was slightly elevated at 25.  No prior for comparison.  Will repeat to assess trend. Repeat Trop flat Renal function intact. Patient is satting well on room air and there is no pulmonary edema on chest x-ray concerning for heart failure. BP stabilized around 170s. Will message PCP/cardiologist to coordinate close follow up to discuss further HTN management.  Abd pain  UA without evidence of infection. CBC without leukocytosis.  CT scan obtained to rule out diverticulitis given tenderness to palpation in left lower quadrant. CT negative for any acute intra-abdominal inflammatory/infectious process.   Final Clinical Impression(s) / ED Diagnoses Final diagnoses:  Other secondary hypertension  Abdominal discomfort in left lower quadrant   The patient appears reasonably screened and/or stabilized for discharge and I doubt any other medical condition or other [facility] requiring further screening, evaluation, or treatment in the ED at this time. I have discussed the findings, Dx and Tx plan with the patient/family who expressed understanding and agree(s) with the plan. Discharge instructions discussed at length. The patient/family was given strict return precautions who verbalized understanding of the instructions. No further questions at time of discharge.  Disposition: Discharge  Condition: Good  ED Discharge Orders     None         Follow Up: [name], NP [phone]  [name] [address] [phone]  Call  to schedule an appointment for close follow up  [name], MD [address] [place] [id number] [phone]  Call  to schedule an  appointment for close follow up     Past Medical History Past Medical History:  Diagnosis Date   AKI (acute kidney injury) 05/27/2020   Arthritis    joints get stiff (06/11/2016)   Bee sting allergy    Bee sting allergy    CAD (coronary artery disease), native coronary artery 02/12/2016   cath showing 50% LM, 40% distal LAD, 70% ost to prox LCs, 40% mid LAD and 95% mid to dist RCA s/p PCI with DES x 2 and stage PCI of the LCx/OM.   Carotid artery stenosis    1-39% bilateral carotid stenosis by dopples 06/2022   Difficult airway 11/30/2023   Duodenal ulcer    History of gout    Hx of adenomatous colonic polyps 04/10/2008   Hyperlipidemia    Hypertension    Hypothyroidism    OSA (obstructive sleep apnea)    tried CPAP; couldn't do it (06/11/2016)   Persistent atrial fibrillation (HCC)    s/p DCCV 03/2015 with reversion back to atrial fibrillation, loaded with Amio and s/p DCCV to NSR 11/2015.  He is now s/p afib albation.  He had amio lung toxicity and amio was stopped.   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   Benign neoplasm of ascending colon 03/16/2024   History of colonic polyps 03/16/2024   Difficult airway 11/30/2023   Subclavian artery stenosis 07/08/2022   Iron  deficiency anemia 08/16/2021   Barrett's esophagus without dysplasia 06/19/2020   Lower extremity edema 05/27/2020   Chronic anticoagulation    Aortic atherosclerosis (HCC) by Chest CT scan in 2018 02/02/2020   BPH with obstruction/lower urinary tract symptoms 10/20/2018   Encounter for therapeutic drug monitoring 09/29/2018   Carotid artery stenosis    Type II diabetes mellitus (HCC)    Hypothyroidism    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, 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 Authorizing Provider  acetaminophen  (TYLENOL ) 325 MG tablet Take 325 mg by mouth as needed for mild pain.    [provider]  allopurinol  (ZYLOPRIM ) 300 MG tablet TAKE 1 TABLET BY MOUTH EVERY DAY 05/04/24   Williamson, Joanna R, NP  Ascorbic Acid (VITAMIN C WITH ROSE HIPS) 500 MG tablet Take 500 mg by mouth daily.    [provider]  atorvastatin  (LIPITOR ) 80 MG tablet TAKE 1/2 TABLET BY MOUTH DAILY 05/15/24   Williamson, Joanna R, NP  B  Complex Vitamins (B COMPLEX PO) Take 1 tablet by mouth daily.    [provider]  carvedilol  (COREG ) 12.5 MG tablet TAKE 1 TABLET (12.5MG  TOTAL) BY MOUTH TWICE A DAY WITH MEALS 05/05/24   Williamson, Joanna R, NP  cholecalciferol  (VITAMIN D ) 1000 UNITS tablet Take 1,000 Units by mouth daily.    [provider]  colchicine  0.6 MG tablet take 2 tablet daily now with start of a flare up, take 1 tablet an hour later Patient not taking: Reported on 06/02/2024 03/14/24   Williamson, Joanna R, NP  Ferrous Sulfate (IRON ) 325 (65 Fe) MG TABS Take 1 tablet (325 mg total) by mouth every other day. 08/16/21   Jeanine Knee, NP  Fluticasone-Umeclidin-Vilant (TRELEGY ELLIPTA ) 100-62.5-25 MCG/ACT AEPB Inhale 100 mcg into the lungs daily. 05/11/24   Hunsucker, Donnice SAUNDERS, MD  furosemide  (LASIX ) 40 MG tablet TAKE 1 TABLET BY MOUTH 1 TO 2 TIMES PER DAY  AS NEEDED FOR FLUID RETENTION OR ANKLE SWELLING 02/01/24   Williamson, Joanna R, NP  levothyroxine  (SYNTHROID ) 100 MCG tablet Take  1 tablet  Daily  on an empty stomach with only water for 30 minutes & no Antacid meds, Calcium  or Magnesium  for 4 hours & avoid Biotin                                                                                 /                                         TAKE                                                     BY                                                  MOUTH 11/09/23   Williamson, Joanna R, NP  loratadine  (CLARITIN ) 10 MG tablet Take 10 mg by mouth daily.    [provider]  metFORMIN  (GLUCOPHAGE -XR) 500 MG 24 hr tablet TAKE 1 TABLETS IN THE MORNING AND 2 TABLETS IN THE EVENING. 05/01/24   Williamson, Joanna R, NP  nystatin  cream (MYCOSTATIN ) Apply 1 Application topically 2 (two) times daily. 04/05/15   [provider]  olmesartan  (BENICAR ) 40 MG tablet TAKE 1 TABLET (40 MG TOTAL) BY MOUTH DAILY FOR BLOOD PRESSURE 03/27/24   Billy Philippe SAUNDERS, NP  Omega-3 Fatty Acids (FISH OIL) 1200 MG CAPS Take 1,200 mg by mouth daily.    [provider]  pantoprazole  (PROTONIX ) 40 MG tablet TAKE 1 TABLET BY MOUTH EVERY DAY BEFORE BREAKFAST 02/09/24   Williamson, Joanna R, NP  potassium chloride  SA (KLOR-CON  M) 20 MEQ tablet 2TABS BY MOUTH DAILY X1DAY,THEN 1 TAB AS NEED WHEN YOU TAKE LASIX  FOR FLUID RETENTION/ANKLE  SWELLING Patient taking differently: Take by mouth. Take half tablet in the am and pm 10/15/23   [name], MD  warfarin (COUMADIN ) 5 MG tablet TAKE 1 TABLET BY MOUTH DAILY. EXCEPT 1 AND 1/2 TABS ON MONDAYS OR AS DIRECTED BY COAGULATION CLINIC 04/05/24   [name], MD  zinc gluconate 50 MG tablet Take 50 mg by mouth daily.    [provider]                                                                                                                                    Allergies Amiodarone  and Bee venom  Review of  Systems Review of Systems As noted in HPI  Physical Exam Vital Signs  I have reviewed the triage vital signs BP (!) 184/87   Pulse 81   Temp 98.6 F (37 C) (Oral)   Resp 19   SpO2 99%   Physical Exam Vitals reviewed.  Constitutional:      General: He is not in acute distress.    Appearance: He is well-developed. He is not diaphoretic.  HENT:     Head: Normocephalic and atraumatic.     Right Ear: External ear normal.     Left Ear: External ear normal.     Nose: Nose normal.     Mouth/Throat:     Mouth: Mucous membranes are moist.  Eyes:     General: No scleral icterus.    Conjunctiva/sclera: Conjunctivae normal.  Neck:     Trachea: Phonation normal.  Cardiovascular:     Rate and Rhythm: Normal rate and regular rhythm.  Pulmonary:     Effort: Pulmonary effort is normal. No respiratory distress.     Breath sounds: No stridor.  Abdominal:     General: There is no distension.     Tenderness: There is abdominal tenderness in the left lower quadrant. There is guarding. There is no rebound.  Musculoskeletal:        General: Normal range of motion.     Cervical back: Normal range of motion.     Comments: Una boots to BLE  Neurological:     Mental Status: He is alert and oriented to person, place, and time.  Psychiatric:        Behavior: Behavior normal.     ED Results and Treatments Labs (all labs ordered are listed, but only abnormal results are displayed) Labs Reviewed  BASIC METABOLIC PANEL WITH GFR - Abnormal; Notable for the following components:      Result Value   Glucose, Bld 154 (*)    All other components within normal limits  CBC - Abnormal; Notable for the following components:   MCV 103.0 (*)    MCH 34.7 (*)    All other components within normal limits  PROTIME-INR - Abnormal; Notable for the following components:   Prothrombin Time 20.8 (*)    INR 1.7 (*)  All other components within normal limits  TROPONIN T, HIGH SENSITIVITY - Abnormal;  Notable for the following components:   Troponin T High Sensitivity 25 (*)    All other components within normal limits  TROPONIN T, HIGH SENSITIVITY - Abnormal; Notable for the following components:   Troponin T High Sensitivity 24 (*)    All other components within normal limits  URINALYSIS, ROUTINE W REFLEX MICROSCOPIC  HEPATIC FUNCTION PANEL                                                                                                                         EKG  EKG Interpretation Date/Time:  Sunday June 04 2024 21:17:39 EST Ventricular Rate:  71 PR Interval:  194 QRS Duration:  84 QT Interval:  414 QTC Calculation: 449 R Axis:   38  Text Interpretation: Normal sinus rhythm Normal ECG When compared with ECG of 02-May-2024 13:34, PREVIOUS ECG IS PRESENT Confirmed by Yolande Charleston [phone]) on 06/04/2024 9:45:46 PM       Radiology CT ABDOMEN PELVIS W CONTRAST Result Date: 06/05/2024 EXAM: CT ABDOMEN AND PELVIS WITH CONTRAST 06/05/2024 02:29:00 AM TECHNIQUE: CT of the abdomen and pelvis was performed with the administration of 80 mL of iohexol  (OMNIPAQUE ) 350 MG/ML injection. Multiplanar reformatted images are provided for review. Automated exposure control, iterative reconstruction, and/or weight-based adjustment of the mA/kV was utilized to reduce the radiation dose to as low as reasonably achievable. COMPARISON: 10/27/2023 CLINICAL HISTORY: LLQ abdominal pain FINDINGS: LOWER CHEST: Mild subpleural scarring at the lung bases. LIVER: The liver is unremarkable. GALLBLADDER AND BILE DUCTS: Gallbladder is unremarkable. No biliary ductal dilatation. SPLEEN: No acute abnormality. PANCREAS: Fatty parenchymal atrophy of the pancreas. ADRENAL GLANDS: No acute abnormality. KIDNEYS, URETERS AND BLADDER: Bilateral posterior bladder diverticuli, right greater than left, with layering calculi in the bladder and diverticuli. No stones in the kidneys or ureters. No hydronephrosis. No perinephric or  periureteral stranding. GI AND BOWEL: Small hiatal hernia. Normal appendix (image 62). Stomach demonstrates no acute abnormality. There is no bowel obstruction. PERITONEUM AND RETROPERITONEUM: No ascites. No free air. VASCULATURE: Atherosclerotic calcifications of the abdominal aorta and branch vessels, although patent. LYMPH NODES: No lymphadenopathy. REPRODUCTIVE ORGANS: The prostate is notable for dystrophic calcifications. BONES AND SOFT TISSUES: Bilateral hip arthroplasties. Degenerative changes of the visualized thoracolumbar spine. No acute osseous abnormality. No focal soft tissue abnormality. IMPRESSION: 1. No acute findings in the abdomen or pelvis. 2. Bilateral posterior bladder diverticui with layering calculi. Electronically signed by: Pinkie Pebbles MD MD 06/05/2024 02:44 AM EST RP Workstation: [id number]   DG Chest 2 View Result Date: 06/04/2024 EXAM: 2 VIEW(S) XRAY OF THE CHEST 06/04/2024 09:41:58 PM COMPARISON: 12/22/2022 CLINICAL HISTORY: CP / HTN FINDINGS: LUNGS AND PLEURA: Mild elevation of the right hemidiaphragm. No focal pulmonary opacity. No pleural effusion. No pneumothorax. HEART AND MEDIASTINUM: Thoracic aortic atherosclerosis. No acute abnormality of the cardiac and mediastinal silhouettes. BONES AND SOFT TISSUES: Mild degenerative changes of the thoracic spine. No  acute osseous abnormality. IMPRESSION: 1. No acute cardiopulmonary abnormality. Electronically signed by: Pinkie Pebbles MD MD 06/04/2024 09:46 PM EST RP Workstation: [id number]    Medications Ordered in ED Medications  iohexol  (OMNIPAQUE ) 350 MG/ML injection 80 mL (80 mLs Intravenous Contrast Given 06/05/24 0237)  acetaminophen  (TYLENOL ) tablet 1,000 mg (1,000 mg Oral Given 06/05/24 0345)   Procedures Procedures  (including critical care time)   This chart was dictated using voice recognition software.  Despite best efforts to proofread,  errors can occur which can change the documentation meaning.      Trine Raynell Moder, MD 06/05/24 0518  "

## 2024-06-05 NOTE — ED Provider Notes (Incomplete)
 " [place] EMERGENCY DEPARTMENT AT [facility] Provider Note  CSN: [id number] Arrival date & time: 06/04/24 2106  Chief Complaint(s) Hypertension  History provided by ***. HPI & MDM Christopher Wade is a 74 y.o. male {Add pertinent medical, surgical, social history, OB history to HPI:1}.   Hypertension   ***   Medical Decision Making Amount and/or Complexity of Data Reviewed Labs: ordered. Radiology: ordered.    Final Clinical Impression(s) / ED Diagnoses Final diagnoses:  None   ***  Past Medical History Past Medical History:  Diagnosis Date   AKI (acute kidney injury) 05/27/2020   Arthritis    joints get stiff (06/11/2016)   Bee sting allergy    Bee sting allergy    CAD (coronary artery disease), native coronary artery 02/12/2016   cath showing 50% LM, 40% distal LAD, 70% ost to prox LCs, 40% mid LAD and 95% mid to dist RCA s/p PCI with DES x 2 and stage PCI of the LCx/OM.   Carotid artery stenosis    1-39% bilateral carotid stenosis by dopples 06/2022   Difficult airway 11/30/2023   Duodenal ulcer    History of gout    Hx of adenomatous colonic polyps 04/10/2008   Hyperlipidemia    Hypertension    Hypothyroidism    OSA (obstructive sleep apnea)    tried CPAP; couldn't do it (06/11/2016)   Persistent atrial fibrillation (HCC)    s/p DCCV 03/2015 with reversion back to atrial fibrillation, loaded with Amio and s/p DCCV to NSR 11/2015.  He is now s/p afib albation.  He had amio lung toxicity and amio was stopped.   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   Benign neoplasm of ascending colon 03/16/2024   History of colonic polyps 03/16/2024   Difficult airway 11/30/2023   Subclavian artery stenosis 07/08/2022    Iron  deficiency anemia 08/16/2021   Barrett's esophagus without dysplasia 06/19/2020   Lower extremity edema 05/27/2020   Chronic anticoagulation    Aortic atherosclerosis (HCC) by Chest CT scan in 2018 02/02/2020   BPH with obstruction/lower urinary tract symptoms 10/20/2018   Encounter for therapeutic drug monitoring 09/29/2018   Carotid artery stenosis    Type II diabetes mellitus (HCC)    Hypothyroidism    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, 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 Authorizing Provider  acetaminophen  (TYLENOL ) 325 MG tablet Take 325 mg by mouth as needed for mild pain.    [provider]  allopurinol  (ZYLOPRIM ) 300 MG tablet TAKE 1 TABLET BY MOUTH EVERY DAY 05/04/24   Williamson, Joanna R, NP  Ascorbic  Acid (VITAMIN C WITH ROSE HIPS) 500 MG tablet Take 500 mg by mouth daily.    [provider]  atorvastatin  (LIPITOR ) 80 MG tablet TAKE 1/2 TABLET BY MOUTH DAILY 05/15/24   Williamson, Joanna R, NP  B Complex Vitamins (B COMPLEX PO) Take 1 tablet by mouth daily.    [provider]  carvedilol  (COREG ) 12.5 MG tablet TAKE 1 TABLET (12.5MG  TOTAL) BY MOUTH TWICE A DAY WITH MEALS 05/05/24   Williamson, Joanna R, NP  cholecalciferol  (VITAMIN D ) 1000 UNITS tablet Take 1,000 Units by mouth daily.    [provider]  colchicine   0.6 MG tablet take 2 tablet daily now with start of a flare up, take 1 tablet an hour later Patient not taking: Reported on 06/02/2024 03/14/24   Williamson, Joanna R, NP  Ferrous Sulfate (IRON ) 325 (65 Fe) MG TABS Take 1 tablet (325 mg total) by mouth every other day. 08/16/21   Jeanine Knee, NP  Fluticasone-Umeclidin-Vilant (TRELEGY ELLIPTA ) 100-62.5-25 MCG/ACT AEPB Inhale 100 mcg into the lungs daily. 05/11/24   Hunsucker, Donnice SAUNDERS, MD  furosemide  (LASIX ) 40 MG tablet TAKE 1 TABLET BY MOUTH 1 TO 2 TIMES PER DAY AS NEEDED FOR FLUID RETENTION OR ANKLE SWELLING 02/01/24   Williamson, Joanna R, NP  levothyroxine  (SYNTHROID ) 100 MCG tablet Take  1 tablet  Daily  on an empty stomach with only water for 30 minutes & no Antacid meds, Calcium  or Magnesium  for 4 hours & avoid Biotin                                                                                 /                                         TAKE                                                     BY                                                  MOUTH 11/09/23   Williamson, Joanna R, NP  loratadine  (CLARITIN ) 10 MG tablet Take 10 mg by mouth daily.    [provider]  metFORMIN  (GLUCOPHAGE -XR) 500 MG 24 hr tablet TAKE 1 TABLETS IN THE MORNING AND 2 TABLETS IN THE EVENING. 05/01/24   Williamson, Joanna R, NP  nystatin  cream (MYCOSTATIN ) Apply 1 Application topically 2 (two) times daily. 04/05/15   [provider]  olmesartan  (BENICAR ) 40 MG tablet TAKE 1 TABLET (40 MG TOTAL) BY MOUTH DAILY FOR BLOOD PRESSURE 03/27/24   Billy Philippe SAUNDERS, NP  Omega-3 Fatty Acids (FISH OIL) 1200 MG CAPS Take 1,200 mg by mouth daily.    [provider]  pantoprazole  (PROTONIX ) 40 MG  tablet TAKE 1 TABLET BY MOUTH EVERY DAY BEFORE BREAKFAST 02/09/24   Williamson, Joanna R, NP  potassium chloride  SA (KLOR-CON  M) 20 MEQ tablet 2TABS BY MOUTH DAILY X1DAY,THEN 1 TAB AS NEED WHEN YOU TAKE LASIX  FOR FLUID RETENTION/ANKLE SWELLING Patient taking  differently: Take by mouth. Take half tablet in the am and pm 10/15/23   [name], MD  warfarin (COUMADIN ) 5 MG tablet TAKE 1 TABLET BY MOUTH DAILY. EXCEPT 1 AND 1/2 TABS ON MONDAYS OR AS DIRECTED BY COAGULATION CLINIC 04/05/24   [name], MD  zinc gluconate 50 MG tablet Take 50 mg by mouth daily.    [provider]                                                                                                                                    Allergies Amiodarone  and Bee venom  Review of Systems Review of Systems As noted in HPI  Physical Exam Vital Signs  I have reviewed the triage vital signs BP (!) (P) 194/85 (BP Location: Right Arm)   Pulse 71   Temp (P) 98 F (36.7 C)   Resp (P) 19   SpO2 91%  *** Physical Exam  ED Results and Treatments Labs (all labs ordered are listed, but only abnormal results are displayed) Labs Reviewed  BASIC METABOLIC PANEL WITH GFR - Abnormal; Notable for the following components:      Result Value   Glucose, Bld 154 (*)    All other components within normal limits  CBC - Abnormal; Notable for the following components:   MCV 103.0 (*)    MCH 34.7 (*)    All other components within normal limits  TROPONIN T, HIGH SENSITIVITY - Abnormal; Notable for the following components:   Troponin T High Sensitivity 25 (*)    All other components within normal limits  URINALYSIS, ROUTINE W REFLEX MICROSCOPIC  TROPONIN T, HIGH SENSITIVITY                                                                                                                         EKG  EKG Interpretation Date/Time:  Sunday June 04 2024 21:17:39 EST Ventricular Rate:  71 PR Interval:  194 QRS Duration:  84 QT Interval:  414 QTC Calculation: 449 R Axis:   38  Text Interpretation: Normal sinus rhythm Normal ECG When compared with ECG of  02-May-2024 13:34, PREVIOUS ECG IS PRESENT Confirmed by Yolande Charleston [id number]) on 06/04/2024 9:45:46 PM        Radiology DG Chest 2 View Result Date: 06/04/2024 EXAM: 2 VIEW(S) XRAY OF THE CHEST 06/04/2024 09:41:58 PM COMPARISON: 12/22/2022 CLINICAL HISTORY: CP / HTN FINDINGS: LUNGS AND PLEURA: Mild elevation of the right hemidiaphragm. No focal pulmonary opacity. No pleural effusion. No pneumothorax. HEART AND MEDIASTINUM: Thoracic aortic atherosclerosis. No acute abnormality of the cardiac and mediastinal silhouettes. BONES AND SOFT TISSUES: Mild degenerative changes of the thoracic spine. No acute osseous abnormality. IMPRESSION: 1. No acute cardiopulmonary abnormality. Electronically signed by: Pinkie Pebbles MD MD 06/04/2024 09:46 PM EST RP Workstation: [id number]    Medications Ordered in ED Medications - No data to display Procedures Procedures  (including critical care time)   This chart was dictated using voice recognition software.  Despite best efforts to proofread,  errors can occur which can change the documentation meaning. "

## 2024-06-05 NOTE — Telephone Encounter (Signed)
-----   Message from [name], MD sent at 06/05/2024  6:59 AM EST ----- Restart Amlodipine  at 5mg  daily and get in with extender in 2 weeks.  Have patient start check Bp twice daily x 1 week starting on Wed and call with results in 1 week ----- Message ----- From: Trine Raynell Moder, MD Sent: 06/05/2024   5:22 AM EST To: Wilbert JONELLE Bihari, MD; Philippe JONELLE Slade, NP  Good morning,  We had the pleasure of taking care of Christopher Wade in the ER today.  He came in for elevated blood pressures.  He was relatively asymptomatic and workup was reassuring.  It appears that amlodipine  was discontinued on December 31 due to the possibility that it was contributing to his lower extremity edema leading to venous stasis wounds.  It appears that he is maxed out on his other blood pressure medication including carvedilol  and Benicar .  He is on Lasix  40 mg twice daily.  Since being taken off of amlodipine  his blood pressures have been increasing.  Can you guys coordinate close follow-up to discuss further blood pressure management?  Please reach out with any questions she might have.  Thank you, Raynell

## 2024-06-06 ENCOUNTER — Other Ambulatory Visit: Payer: Self-pay | Admitting: Cardiology

## 2024-06-06 ENCOUNTER — Telehealth: Payer: Self-pay | Admitting: Family

## 2024-06-06 DIAGNOSIS — I4819 Other persistent atrial fibrillation: Secondary | ICD-10-CM

## 2024-06-06 NOTE — Telephone Encounter (Signed)
 Per documentation yesterday: Dr. Shlomo advised resume Amlodipine  5mg  daily and follow up with APP scheduled for 06/19/24 per Dr. Dorine recommendation. He was advised to call in 1 week with blood pressure.   Aodhan Scheidt S Geneviene Tesch, NP

## 2024-06-06 NOTE — Telephone Encounter (Signed)
 Pt would like a c/b regarding recent hospital visit and medication changes. Please advise

## 2024-06-06 NOTE — Telephone Encounter (Signed)
 Warfarin 5mg  Dx-Afib Last INR Check-05/30/24 Last OV- 06/02/24

## 2024-06-06 NOTE — Telephone Encounter (Signed)
 Call did not go through, did not ring.  Will need to try back.

## 2024-06-07 NOTE — Telephone Encounter (Signed)
 Advised patient, verbalized understanding  Verified date, time, and location

## 2024-06-09 ENCOUNTER — Encounter (HOSPITAL_BASED_OUTPATIENT_CLINIC_OR_DEPARTMENT_OTHER): Admitting: General Surgery

## 2024-06-15 ENCOUNTER — Ambulatory Visit: Admitting: Family Medicine

## 2024-06-15 ENCOUNTER — Encounter: Payer: Self-pay | Admitting: Family Medicine

## 2024-06-15 VITALS — BP 142/86 | HR 66 | Temp 98.4°F | Ht 70.0 in | Wt 235.0 lb

## 2024-06-15 DIAGNOSIS — Z7984 Long term (current) use of oral hypoglycemic drugs: Secondary | ICD-10-CM

## 2024-06-15 DIAGNOSIS — E785 Hyperlipidemia, unspecified: Secondary | ICD-10-CM | POA: Diagnosis not present

## 2024-06-15 DIAGNOSIS — N182 Chronic kidney disease, stage 2 (mild): Secondary | ICD-10-CM | POA: Diagnosis not present

## 2024-06-15 DIAGNOSIS — E1169 Type 2 diabetes mellitus with other specified complication: Secondary | ICD-10-CM

## 2024-06-15 DIAGNOSIS — E559 Vitamin D deficiency, unspecified: Secondary | ICD-10-CM

## 2024-06-15 DIAGNOSIS — E1122 Type 2 diabetes mellitus with diabetic chronic kidney disease: Secondary | ICD-10-CM

## 2024-06-15 DIAGNOSIS — Z0000 Encounter for general adult medical examination without abnormal findings: Secondary | ICD-10-CM

## 2024-06-15 DIAGNOSIS — E039 Hypothyroidism, unspecified: Secondary | ICD-10-CM | POA: Diagnosis not present

## 2024-06-15 DIAGNOSIS — I10 Essential (primary) hypertension: Secondary | ICD-10-CM

## 2024-06-15 LAB — LIPID PANEL
Cholesterol: 122 mg/dL (ref 28–200)
HDL: 42.1 mg/dL
LDL Cholesterol: 42 mg/dL (ref 10–99)
NonHDL: 80.27
Total CHOL/HDL Ratio: 3
Triglycerides: 192 mg/dL — ABNORMAL HIGH (ref 10.0–149.0)
VLDL: 38.4 mg/dL (ref 0.0–40.0)

## 2024-06-15 LAB — COMPREHENSIVE METABOLIC PANEL WITH GFR
ALT: 18 U/L (ref 3–53)
AST: 21 U/L (ref 5–37)
Albumin: 4.4 g/dL (ref 3.5–5.2)
Alkaline Phosphatase: 94 U/L (ref 39–117)
BUN: 12 mg/dL (ref 6–23)
CO2: 27 meq/L (ref 19–32)
Calcium: 9.3 mg/dL (ref 8.4–10.5)
Chloride: 100 meq/L (ref 96–112)
Creatinine, Ser: 0.93 mg/dL (ref 0.40–1.50)
GFR: 81.09 mL/min
Glucose, Bld: 118 mg/dL — ABNORMAL HIGH (ref 70–99)
Potassium: 4.3 meq/L (ref 3.5–5.1)
Sodium: 139 meq/L (ref 135–145)
Total Bilirubin: 0.6 mg/dL (ref 0.2–1.2)
Total Protein: 7.8 g/dL (ref 6.0–8.3)

## 2024-06-15 LAB — CBC WITH DIFFERENTIAL/PLATELET
Basophils Absolute: 0.1 K/uL (ref 0.0–0.1)
Basophils Relative: 0.7 % (ref 0.0–3.0)
Eosinophils Absolute: 0.3 K/uL (ref 0.0–0.7)
Eosinophils Relative: 3.6 % (ref 0.0–5.0)
HCT: 44.1 % (ref 39.0–52.0)
Hemoglobin: 15.1 g/dL (ref 13.0–17.0)
Lymphocytes Relative: 22.8 % (ref 12.0–46.0)
Lymphs Abs: 1.7 K/uL (ref 0.7–4.0)
MCHC: 34.1 g/dL (ref 30.0–36.0)
MCV: 102.4 fl — ABNORMAL HIGH (ref 78.0–100.0)
Monocytes Absolute: 0.6 K/uL (ref 0.1–1.0)
Monocytes Relative: 7.9 % (ref 3.0–12.0)
Neutro Abs: 4.9 K/uL (ref 1.4–7.7)
Neutrophils Relative %: 65 % (ref 43.0–77.0)
Platelets: 267 K/uL (ref 150.0–400.0)
RBC: 4.31 Mil/uL (ref 4.22–5.81)
RDW: 15.8 % — ABNORMAL HIGH (ref 11.5–15.5)
WBC: 7.5 K/uL (ref 4.0–10.5)

## 2024-06-15 LAB — MICROALBUMIN / CREATININE URINE RATIO
Creatinine,U: 49 mg/dL
Microalb Creat Ratio: UNDETERMINED mg/g (ref 0.0–30.0)
Microalb, Ur: <0.7 mg/dL

## 2024-06-15 LAB — HEMOGLOBIN A1C: Hgb A1c MFr Bld: 6.9 % — ABNORMAL HIGH (ref 4.6–6.5)

## 2024-06-15 LAB — TSH: TSH: 3 u[IU]/mL (ref 0.35–5.50)

## 2024-06-15 LAB — VITAMIN D 25 HYDROXY (VIT D DEFICIENCY, FRACTURES): VITD: 63.58 ng/mL (ref 30.00–100.00)

## 2024-06-15 NOTE — Patient Instructions (Addendum)
-  It was nice to see you today.  -Physical exam completed.  -Continue all medications, except Colchicine .  -Continue to work on a low sodium, low carbohydrate, and low fat diet.  -Ordered labs and urine sample. Office will call with lab results and will be available via MyChart. -Follow up with specialist. Take blood pressure readings to your cardiology appointment. -Follow up in 3 months.

## 2024-06-15 NOTE — Progress Notes (Signed)
 "  Complete physical exam  Patient: Christopher Wade   DOB: 1950/08/19   74 y.o. Male  MRN: [id number]  Subjective:    Chief Complaint  Patient presents with   Annual Exam    Christopher Wade is a 74 y.o. male who presents today for a complete physical exam. He reports consuming a low sodium diet. Exercising: None. He generally feels fair. He reports sleeping fairly well. He does not have additional problems to discuss today.    Most recent fall risk assessment:    06/15/2024    2:07 PM  Fall Risk   Falls in the past year? 0  Number falls in past yr: 0  Injury with Fall? 0  Risk for fall due to : No Fall Risks  Follow up Falls evaluation completed     Most recent depression screenings:    06/15/2024    2:08 PM 03/14/2024    1:06 PM  PHQ 2/9 Scores  PHQ - 2 Score 0 0  PHQ- 9 Score 0 0      Data saved with a previous flowsheet row definition    Vision:Within last year and Dental: No current dental problems and Receives regular dental care  Past Medical History:  Diagnosis Date   AKI (acute kidney injury) 05/27/2020   Arthritis    joints get stiff (06/11/2016)   Bee sting allergy    Bee sting allergy    CAD (coronary artery disease), native coronary artery 02/12/2016   cath showing 50% LM, 40% distal LAD, 70% ost to prox LCs, 40% mid LAD and 95% mid to dist RCA s/p PCI with DES x 2 and stage PCI of the LCx/OM.   Carotid artery stenosis    1-39% bilateral carotid stenosis by dopples 06/2022   Difficult airway 11/30/2023   Duodenal ulcer    History of gout    Hx of adenomatous colonic polyps 04/10/2008   Hyperlipidemia    Hypertension    Hypothyroidism    OSA (obstructive sleep apnea)    tried CPAP; couldn't do it (06/11/2016)   Persistent atrial fibrillation (HCC)    s/p DCCV 03/2015 with reversion back to atrial fibrillation, loaded with Amio and s/p DCCV to NSR 11/2015.  He is now s/p afib albation.  He had amio lung toxicity and amio was stopped.   Subclavian  artery stenosis    right subclavian artery stenosis by dopplers 2/24   Thyroid  disease    Type II diabetes mellitus (HCC)    Vitamin D  deficiency    Past Surgical History:  Procedure Laterality Date   BIOPSY  05/28/2020   Procedure: BIOPSY;  Surgeon: Albertus Gordy HERO, MD;  Location: [facility] ENDOSCOPY;  Service: Gastroenterology;;   BIOPSY OF SKIN SUBCUTANEOUS TISSUE AND/OR MUCOUS MEMBRANE  03/16/2024   Procedure: BIOPSY, SKIN, SUBCUTANEOUS TISSUE, OR MUCOUS MEMBRANE;  Surgeon: Avram Lupita BRAVO, MD;  Location: [facility]COPY;  Service: Gastroenterology;;   CARDIAC CATHETERIZATION N/A 10/11/2015   Procedure: Left Heart Cath and Coronary Angiography;  Surgeon: Maude JAYSON Emmer, MD;  Location: [facility] INVASIVE CV LAB;  Service: Cardiovascular;  Laterality: N/A;   CARDIAC CATHETERIZATION N/A 10/11/2015   Procedure: Coronary Stent Intervention;  Surgeon: Peter M Jordan, MD;  Location: [facility] INVASIVE CV LAB;  Service: Cardiovascular;  Laterality: N/A;   CARDIAC CATHETERIZATION N/A 10/11/2015   Procedure: Intravascular Pressure Wire/FFR Study;  Surgeon: Peter M Jordan, MD;  Location: [facility] INVASIVE CV LAB;  Service: Cardiovascular;  Laterality: N/A;   CARDIAC CATHETERIZATION N/A  10/14/2015   Procedure: Coronary Stent Intervention;  Surgeon: Candyce GORMAN Reek, MD;  Location: [facility] INVASIVE CV LAB;  Service: Cardiovascular;  Laterality: N/A;   CARDIOVERSION N/A 04/05/2015   Procedure: CARDIOVERSION;  Surgeon: Wilbert JONELLE Bihari, MD;  Location: [facility] ENDOSCOPY;  Service: Cardiovascular;  Laterality: N/A;   CARDIOVERSION N/A 12/09/2015   Procedure: CARDIOVERSION;  Surgeon: Wilbert JONELLE Bihari, MD;  Location: [facility] ENDOSCOPY;  Service: Cardiovascular;  Laterality: N/A;   CARDIOVERSION N/A 03/16/2016   Procedure: CARDIOVERSION;  Surgeon: [name] Scarce, MD;  Location: [facility] ENDOSCOPY;  Service: Cardiovascular;  Laterality: N/A;   CATARACT EXTRACTION Right 2019   Dr. Jens   COLONOSCOPY N/A 03/16/2024   Procedure: COLONOSCOPY;  Surgeon: Avram Lupita BRAVO, MD;  Location: [facility] ENDOSCOPY;  Service: Gastroenterology;  Laterality: N/A;   CORONARY ANGIOPLASTY     ELECTROPHYSIOLOGIC STUDY N/A 06/11/2016   Procedure: Atrial Fibrillation Ablation;  Surgeon: Will Gladis Norton, MD;  Location: [facility] INVASIVE CV LAB;  Service: Cardiovascular;  Laterality: N/A;   ESOPHAGOGASTRODUODENOSCOPY N/A 03/16/2024   Procedure: EGD (ESOPHAGOGASTRODUODENOSCOPY);  Surgeon: Avram Lupita BRAVO, MD;  Location: [facility];  Service: Gastroenterology;  Laterality: N/A;   ESOPHAGOGASTRODUODENOSCOPY (EGD) WITH PROPOFOL  N/A 05/28/2020   Procedure: ESOPHAGOGASTRODUODENOSCOPY (EGD) WITH PROPOFOL ;  Surgeon: Albertus Gordy HERO, MD;  Location: [facility] ENDOSCOPY;  Service: Gastroenterology;  Laterality: N/A;   POLYPECTOMY  03/16/2024   Procedure: POLYPECTOMY, INTESTINE;  Surgeon: Avram Lupita BRAVO, MD;  Location: [facility];  Service: Gastroenterology;;   TOTAL HIP ARTHROPLASTY Bilateral 2001   UVULECTOMY     Social History[1] Social History   Socioeconomic History   Marital status: Single    Spouse name: Not on file   Number of children: 0   Years of education: Not on file   Highest education level: High school graduate  Occupational History   Occupation: retired  Tobacco Use   Smoking status: Never   Smokeless tobacco: Never  Vaping Use   Vaping status: Never Used  Substance and Sexual Activity   Alcohol use: No   Drug use: No   Sexual activity: Not Currently  Other Topics Concern   Not on file  Social History Narrative   Not on file   Social Drivers of Health   Tobacco Use: Low Risk (06/15/2024)   Patient History    Smoking Tobacco Use: Never    Smokeless Tobacco Use: Never    Passive Exposure: Not on file  Financial Resource Strain: Low Risk (12/14/2023)   Overall Financial Resource Strain (CARDIA)    Difficulty of Paying Living Expenses: Not hard at all  Food Insecurity: No Food Insecurity (12/14/2023)   Epic    Worried About Radiation Protection Practitioner of Food in the Last Year:  Never true    Ran Out of Food in the Last Year: Never true  Transportation Needs: No Transportation Needs (12/14/2023)   Epic    Lack of Transportation (Medical): No    Lack of Transportation (Non-Medical): No  Physical Activity: Inactive (12/14/2023)   Exercise Vital Sign    Days of Exercise per Week: 0 days    Minutes of Exercise per Session: 0 min  Stress: No Stress Concern Present (12/14/2023)   [employer] of Occupational Health - Occupational Stress Questionnaire    Feeling of Stress: Not at all  Social Connections: Moderately Isolated (12/14/2023)   Social Connection and Isolation Panel    Frequency of Communication with Friends and Family: Twice a week    Frequency of Social Gatherings with Friends and Family:  Once a week    Attends Religious Services: 1 to 4 times per year    Active Member of Clubs or Organizations: No    Attends Banker Meetings: Never    Marital Status: Never married  Intimate Partner Violence: Not At Risk (12/14/2023)   Epic    Fear of Current or Ex-Partner: No    Emotionally Abused: No    Physically Abused: No    Sexually Abused: No  Depression (PHQ2-9): Low Risk (06/15/2024)   Depression (PHQ2-9)    PHQ-2 Score: 0  Alcohol Screen: Low Risk (09/13/2023)   Alcohol Screen    Last Alcohol Screening Score (AUDIT): 0  Housing: Low Risk (12/14/2023)   Epic    Unable to Pay for Housing in the Last Year: No    Number of Times Moved in the Last Year: 0    Homeless in the Last Year: No  Utilities: Not At Risk (12/14/2023)   Epic    Threatened with loss of utilities: No  Health Literacy: Adequate Health Literacy (12/14/2023)   B1300 Health Literacy    Frequency of need for help with medical instructions: Never   Family Status  Relation Name Status   Mother  Deceased at age 68   Father  Deceased at age 85   Sister  Deceased at age 20   Sister  Deceased   Brother  Deceased   Brother  Deceased   Brother  Alive   MGM  Deceased   MGF   Deceased   PGM  Deceased   PGF  Deceased   Neg Hx  (Not Specified)  No partnership data on file   Family History  Problem Relation Age of Onset   Lung cancer Mother    Liver disease Mother    Lung cancer Father    Liver disease Father    Colon cancer Sister    Liver disease Sister    Diabetes Sister    Prostate cancer Brother    Liver disease Brother    Stomach cancer Neg Hx    Pancreatic cancer Neg Hx    Esophageal cancer Neg Hx    Allergies[2]    Patient Care Team: Billy Philippe SAUNDERS, NP as PCP - General (Family Medicine) Avram Lupita BRAVO, MD as Consulting Physician (Gastroenterology) [name], MD as Consulting Physician (Cardiology) Quinn Odor, [facility] (Inactive) as Pharmacist (Pharmacist) Inocencio Soyla Lunger, MD as Consulting Physician (Cardiology) Hunsucker, Donnice SAUNDERS, MD as Consulting Physician (Pulmonary Disease) Loreda Hacker, DPM as Consulting Physician (Podiatry) Marolyn Nest, MD as Consulting Physician (General Surgery)   Show/hide medication list[3]  Review of Systems  Constitutional: Negative.   HENT: Negative.    Eyes: Negative.   Respiratory:  Positive for shortness of breath (On exertion).   Cardiovascular:  Positive for leg swelling.  Gastrointestinal:  Positive for diarrhea.  Genitourinary: Negative.   Musculoskeletal:  Positive for back pain.  Skin: Negative.   Neurological:  Positive for weakness (Intermittent).  Endo/Heme/Allergies: Negative.   Psychiatric/Behavioral: Negative.     See HPI above     Objective:   BP (!) 142/86   Pulse 66   Temp 98.4 F (36.9 C) (Oral)   Ht 5' 10 (1.778 m)   Wt 235 lb (106.6 kg)   SpO2 93%   BMI 33.72 kg/m  BP Readings from Last 3 Encounters:  06/15/24 (!) 142/86  06/05/24 (!) 173/89  06/02/24 (!) 154/76   Wt Readings from Last 3 Encounters:  06/15/24 235 lb (  106.6 kg)  06/02/24 239 lb (108.4 kg)  05/24/24 231 lb (104.8 kg)   Physical Exam Vitals reviewed.  Constitutional:       General: He is not in acute distress.    Appearance: Normal appearance. He is obese. He is not ill-appearing, toxic-appearing or diaphoretic.  HENT:     Head: Normocephalic and atraumatic.     Right Ear: Tympanic membrane, ear canal and external ear normal. There is no impacted cerumen.     Left Ear: Tympanic membrane, ear canal and external ear normal. There is no impacted cerumen.     Nose:     Right Sinus: No maxillary sinus tenderness or frontal sinus tenderness.     Left Sinus: No maxillary sinus tenderness or frontal sinus tenderness.     Mouth/Throat:     Mouth: Mucous membranes are moist.     Pharynx: Oropharynx is clear. Uvula midline. No pharyngeal swelling, oropharyngeal exudate, posterior oropharyngeal erythema or uvula swelling.  Eyes:     General:        Right eye: No discharge.        Left eye: No discharge.     Conjunctiva/sclera: Conjunctivae normal.     Pupils: Pupils are equal, round, and reactive to light.  Neck:     Thyroid : No thyromegaly.  Cardiovascular:     Rate and Rhythm: Normal rate and regular rhythm.     Heart sounds: Normal heart sounds. No murmur heard.    No friction rub. No gallop.  Pulmonary:     Effort: Pulmonary effort is normal. No respiratory distress.     Breath sounds: Normal breath sounds.  Abdominal:     General: Bowel sounds are normal. There is no distension.     Palpations: Abdomen is soft. There is no mass.     Tenderness: There is no abdominal tenderness. There is no right CVA tenderness or left CVA tenderness.  Musculoskeletal:        General: Normal range of motion.     Cervical back: Normal range of motion.  Lymphadenopathy:     Cervical: No cervical adenopathy.  Skin:    General: Skin is warm and dry.     Comments: Lower extremity are wrapped from wound care-bilateral right below knees to feet   Neurological:     General: No focal deficit present.     Mental Status: He is alert. Mental status is at baseline.     Gait:  Gait abnormal (One crutch).  Psychiatric:        Mood and Affect: Mood normal.        Behavior: Behavior normal.        Thought Content: Thought content normal.        Judgment: Judgment normal.        Assessment & Plan:    Routine Health Maintenance and Physical Exam  Immunization History  Administered Date(s) Administered   INFLUENZA, HIGH DOSE SEASONAL PF 03/31/2018, 03/20/2019, 05/07/2020, 05/28/2021, 03/17/2023, 03/14/2024   Influenza Split 04/05/2014   Influenza-Unspecified 02/02/2013, 05/21/2016   PFIZER Comirnaty(Gray Top)Covid-19 Tri-Sucrose Vaccine 05/30/2021   PFIZER(Purple Top)SARS-COV-2 Vaccination 12/13/2019, 01/03/2020, 07/09/2020   Pfizer(Comirnaty)Fall Seasonal Vaccine 12 years and older 03/17/2023, 03/20/2024   Pneumococcal Conjugate-13 05/01/2014   Pneumococcal Polysaccharide-23 05/16/2015   Td 04/25/2010, 05/07/2020   Varicella Zoster Immune Globulin 02/22/2014   Zoster Recombinant(Shingrix) 06/01/2022, 08/18/2022    Health Maintenance  Topic Date Due   Diabetic kidney evaluation - Urine ACR  06/14/2024   FOOT EXAM  09/12/2024   HEMOGLOBIN A1C  09/12/2024   COVID-19 Vaccine (7 - Pfizer risk 2025-26 season) 09/18/2024   Medicare Annual Wellness (AWV)  12/13/2024   OPHTHALMOLOGY EXAM  03/23/2025   Diabetic kidney evaluation - eGFR measurement  06/04/2025   Colonoscopy  03/16/2029   DTaP/Tdap/Td (3 - Tdap) 05/07/2030   Pneumococcal Vaccine: 50+ Years  Completed   Influenza Vaccine  Completed   Hepatitis C Screening  Completed   Zoster Vaccines- Shingrix  Completed   Meningococcal B Vaccine  Aged Out    Discussed health benefits of physical activity, and encouraged him to engage in regular exercise appropriate for his age and condition.  Annual physical exam -     CBC with Differential/Platelet -     Comprehensive metabolic panel with GFR -     Hemoglobin A1c -     Lipid panel -     TSH  Type 2 diabetes mellitus with stage 2 chronic kidney  disease, without long-term current use of insulin  (HCC) -     Microalbumin / creatinine urine ratio -     Comprehensive metabolic panel with GFR -     Hemoglobin A1c  Essential hypertension -     Comprehensive metabolic panel with GFR  Hyperlipidemia associated with type 2 diabetes mellitus (HCC) -     Comprehensive metabolic panel with GFR -     Lipid panel  Hypothyroidism, unspecified type -     TSH  Vitamin D  deficiency -     VITAMIN D  25 Hydroxy (Vit-D Deficiency, Fractures)  -Physical exam completed.  -Continue all medications, except Colchicine .  -Continue to work on a low sodium, low carbohydrate, and low fat diet.  -Ordered labs and microalbumin based on a physical exam, diabetes, HTN, hyperlipidemia, hypothyroidism, and vitamin D  deficiency. Office will call with lab results and will be available via MyChart. Send a copy of lab results to his care team.  -Follow up with specialist. Advised to take blood pressure readings to his cardiologist appointment. He brought in readings ranging 116-170/62-86. Heart rate 50-60s.   Return in about 3 months (around 09/13/2024) for chronic management.     Philippe Slade, NP     [1]  Social History Tobacco Use   Smoking status: Never   Smokeless tobacco: Never  Vaping Use   Vaping status: Never Used  Substance Use Topics   Alcohol use: No   Drug use: No  [2]  Allergies Allergen Reactions   Amiodarone  Other (See Comments)    Lung toxicity   Bee Venom Other (See Comments)    Leg went numb  [3]  Outpatient Medications Prior to Visit  Medication Sig   acetaminophen  (TYLENOL ) 325 MG tablet Take 325 mg by mouth as needed for mild pain.   allopurinol  (ZYLOPRIM ) 300 MG tablet TAKE 1 TABLET BY MOUTH EVERY DAY   amLODipine  (NORVASC ) 5 MG tablet Take 1 tablet (5 mg total) by mouth daily.   Ascorbic Acid (VITAMIN C WITH ROSE HIPS) 500 MG tablet Take 500 mg by mouth daily.   atorvastatin  (LIPITOR ) 80 MG tablet TAKE 1/2 TABLET BY  MOUTH DAILY   B Complex Vitamins (B COMPLEX PO) Take 1 tablet by mouth daily.   carvedilol  (COREG ) 12.5 MG tablet TAKE 1 TABLET (12.5MG  TOTAL) BY MOUTH TWICE A DAY WITH MEALS   cholecalciferol  (VITAMIN D ) 1000 UNITS tablet Take 1,000 Units by mouth daily.   Ferrous Sulfate (IRON ) 325 (65 Fe) MG TABS Take 1 tablet (325 mg total) by  mouth every other day.   Fluticasone-Umeclidin-Vilant (TRELEGY ELLIPTA ) 100-62.5-25 MCG/ACT AEPB Inhale 100 mcg into the lungs daily.   furosemide  (LASIX ) 40 MG tablet TAKE 1 TABLET BY MOUTH 1 TO 2 TIMES PER DAY AS NEEDED FOR FLUID RETENTION OR ANKLE SWELLING   levothyroxine  (SYNTHROID ) 100 MCG tablet Take  1 tablet  Daily  on an empty stomach with only water for 30 minutes & no Antacid meds, Calcium  or Magnesium  for 4 hours & avoid Biotin                                                                                 /                                         TAKE                                                     BY                                                  MOUTH   loratadine  (CLARITIN ) 10 MG tablet Take 10 mg by mouth daily.   metFORMIN  (GLUCOPHAGE -XR) 500 MG 24 hr tablet TAKE 1 TABLETS IN THE MORNING AND 2 TABLETS IN THE EVENING.   olmesartan  (BENICAR ) 40 MG tablet TAKE 1 TABLET (40 MG TOTAL) BY MOUTH DAILY FOR BLOOD PRESSURE   Omega-3 Fatty Acids (FISH OIL) 1200 MG CAPS Take 1,200 mg by mouth daily.   pantoprazole  (PROTONIX ) 40 MG tablet TAKE 1 TABLET BY MOUTH EVERY DAY BEFORE BREAKFAST   potassium chloride  SA (KLOR-CON  M) 20 MEQ tablet 2TABS BY MOUTH DAILY X1DAY,THEN 1 TAB AS NEED WHEN YOU TAKE LASIX  FOR FLUID RETENTION/ANKLE SWELLING (Patient taking differently: Take by mouth. Take half tablet in the am and pm)   warfarin (COUMADIN ) 5 MG tablet TAKE 1 AND 1/2 TABLETS BY MOUTH DAILY EXCEPT 1 TABLET ON MONDAY, THURSDAY, SATURDAY AS DIRECTED BY COAGULATION CLINIC   zinc gluconate 50 MG tablet Take 50 mg by mouth daily.   [DISCONTINUED] nystatin  cream (MYCOSTATIN )  Apply 1 Application topically 2 (two) times daily.   colchicine  0.6 MG tablet take 2 tablet daily now with start of a flare up, take 1 tablet an hour later (Patient not taking: Reported on 06/15/2024)   No facility-administered medications prior to visit.   "

## 2024-06-16 ENCOUNTER — Ambulatory Visit: Payer: Self-pay | Admitting: Family Medicine

## 2024-06-16 ENCOUNTER — Ambulatory Visit

## 2024-06-16 ENCOUNTER — Encounter (HOSPITAL_BASED_OUTPATIENT_CLINIC_OR_DEPARTMENT_OTHER): Admitting: General Surgery

## 2024-06-16 DIAGNOSIS — R718 Other abnormality of red blood cells: Secondary | ICD-10-CM

## 2024-06-16 LAB — FOLATE: Folate: 20.7 ng/mL

## 2024-06-16 LAB — IRON: Iron: 81 ug/dL (ref 42–165)

## 2024-06-16 LAB — FERRITIN: Ferritin: 93.4 ng/mL (ref 22.0–322.0)

## 2024-06-16 LAB — VITAMIN B12: Vitamin B-12: 880 pg/mL (ref 211–911)

## 2024-06-19 ENCOUNTER — Ambulatory Visit: Admitting: Physician Assistant

## 2024-06-19 ENCOUNTER — Ambulatory Visit: Payer: Self-pay | Admitting: Family Medicine

## 2024-06-22 ENCOUNTER — Encounter (HOSPITAL_BASED_OUTPATIENT_CLINIC_OR_DEPARTMENT_OTHER): Admitting: General Surgery

## 2024-06-23 ENCOUNTER — Ambulatory Visit (HOSPITAL_BASED_OUTPATIENT_CLINIC_OR_DEPARTMENT_OTHER): Payer: Self-pay | Admitting: Family

## 2024-06-23 ENCOUNTER — Other Ambulatory Visit: Payer: Self-pay | Admitting: Family Medicine

## 2024-06-23 ENCOUNTER — Telehealth: Payer: Self-pay

## 2024-06-23 ENCOUNTER — Ambulatory Visit (INDEPENDENT_AMBULATORY_CARE_PROVIDER_SITE_OTHER)

## 2024-06-23 DIAGNOSIS — R600 Localized edema: Secondary | ICD-10-CM

## 2024-06-23 DIAGNOSIS — R0609 Other forms of dyspnea: Secondary | ICD-10-CM | POA: Diagnosis not present

## 2024-06-23 DIAGNOSIS — I10 Essential (primary) hypertension: Secondary | ICD-10-CM

## 2024-06-23 LAB — ECHOCARDIOGRAM COMPLETE
AR max vel: 2.53 cm2
AV Area VTI: 2.88 cm2
AV Area mean vel: 2.63 cm2
AV Mean grad: 3 mmHg
AV Peak grad: 6 mmHg
Ao pk vel: 1.22 m/s
Area-P 1/2: 4.33 cm2
S' Lateral: 2.68 cm

## 2024-06-23 NOTE — Telephone Encounter (Signed)
 Called pt let pt know and he was understanding with no concerns.

## 2024-06-23 NOTE — Telephone Encounter (Signed)
 Copied from CRM [id number]. Topic: Clinical - Lab/Test Results >> Jun 23, 2024  9:19 AM Wess RAMAN wrote: Reason for CRM: Patient would like a call back to discuss red blood cells  Callback #: [phone]

## 2024-06-30 ENCOUNTER — Encounter (HOSPITAL_BASED_OUTPATIENT_CLINIC_OR_DEPARTMENT_OTHER): Admitting: General Surgery

## 2024-07-04 ENCOUNTER — Ambulatory Visit: Admitting: Physician Assistant

## 2024-07-05 ENCOUNTER — Ambulatory Visit: Admitting: Podiatry

## 2024-07-06 ENCOUNTER — Encounter (HOSPITAL_BASED_OUTPATIENT_CLINIC_OR_DEPARTMENT_OTHER): Admitting: General Surgery

## 2024-07-11 ENCOUNTER — Ambulatory Visit

## 2024-07-13 ENCOUNTER — Encounter (HOSPITAL_BASED_OUTPATIENT_CLINIC_OR_DEPARTMENT_OTHER): Admitting: General Surgery

## 2024-07-20 ENCOUNTER — Encounter (HOSPITAL_BASED_OUTPATIENT_CLINIC_OR_DEPARTMENT_OTHER): Admitting: General Surgery

## 2024-07-21 ENCOUNTER — Ambulatory Visit: Admitting: Cardiology

## 2024-08-24 ENCOUNTER — Encounter (HOSPITAL_BASED_OUTPATIENT_CLINIC_OR_DEPARTMENT_OTHER): Admitting: Family

## 2024-09-14 ENCOUNTER — Ambulatory Visit: Admitting: Family Medicine

## 2024-11-13 ENCOUNTER — Ambulatory Visit: Admitting: Pulmonary Disease

## 2024-12-26 ENCOUNTER — Encounter
